# Patient Record
Sex: Female | Born: 1970
Health system: Southern US, Community
[De-identification: ages and names within clinical notes are randomized; demographics above are authoritative.]

## PROBLEM LIST (undated history)

## (undated) DIAGNOSIS — G459 Transient cerebral ischemic attack, unspecified: Secondary | ICD-10-CM

## (undated) DIAGNOSIS — E11319 Type 2 diabetes mellitus with unspecified diabetic retinopathy without macular edema: Secondary | ICD-10-CM

## (undated) DIAGNOSIS — H3320 Serous retinal detachment, unspecified eye: Secondary | ICD-10-CM

## (undated) DIAGNOSIS — K219 Gastro-esophageal reflux disease without esophagitis: Secondary | ICD-10-CM

## (undated) DIAGNOSIS — F319 Bipolar disorder, unspecified: Secondary | ICD-10-CM

## (undated) DIAGNOSIS — F329 Major depressive disorder, single episode, unspecified: Secondary | ICD-10-CM

## (undated) DIAGNOSIS — Z9289 Personal history of other medical treatment: Secondary | ICD-10-CM

## (undated) DIAGNOSIS — N183 Chronic kidney disease, stage 3 (moderate): Secondary | ICD-10-CM

## (undated) DIAGNOSIS — J449 Chronic obstructive pulmonary disease, unspecified: Secondary | ICD-10-CM

## (undated) DIAGNOSIS — G709 Myoneural disorder, unspecified: Secondary | ICD-10-CM

## (undated) DIAGNOSIS — F32A Depression, unspecified: Secondary | ICD-10-CM

## (undated) DIAGNOSIS — R252 Cramp and spasm: Secondary | ICD-10-CM

## (undated) DIAGNOSIS — N184 Chronic kidney disease, stage 4 (severe): Secondary | ICD-10-CM

## (undated) DIAGNOSIS — I10 Essential (primary) hypertension: Secondary | ICD-10-CM

## (undated) DIAGNOSIS — E119 Type 2 diabetes mellitus without complications: Secondary | ICD-10-CM

## (undated) DIAGNOSIS — G629 Polyneuropathy, unspecified: Secondary | ICD-10-CM

## (undated) DIAGNOSIS — I639 Cerebral infarction, unspecified: Secondary | ICD-10-CM

## (undated) DIAGNOSIS — R0600 Dyspnea, unspecified: Secondary | ICD-10-CM

## (undated) DIAGNOSIS — D649 Anemia, unspecified: Secondary | ICD-10-CM

## (undated) DIAGNOSIS — I469 Cardiac arrest, cause unspecified: Secondary | ICD-10-CM

## (undated) DIAGNOSIS — Z8673 Personal history of transient ischemic attack (TIA), and cerebral infarction without residual deficits: Secondary | ICD-10-CM

## (undated) DIAGNOSIS — M1990 Unspecified osteoarthritis, unspecified site: Secondary | ICD-10-CM

## (undated) DIAGNOSIS — F419 Anxiety disorder, unspecified: Secondary | ICD-10-CM

## (undated) DIAGNOSIS — R262 Difficulty in walking, not elsewhere classified: Secondary | ICD-10-CM

## (undated) DIAGNOSIS — I2510 Atherosclerotic heart disease of native coronary artery without angina pectoris: Secondary | ICD-10-CM

## (undated) DIAGNOSIS — I509 Heart failure, unspecified: Secondary | ICD-10-CM

## (undated) DIAGNOSIS — F4310 Post-traumatic stress disorder, unspecified: Secondary | ICD-10-CM

## (undated) DIAGNOSIS — R569 Unspecified convulsions: Secondary | ICD-10-CM

## (undated) HISTORY — PX: LASIK: SHX215

## (undated) HISTORY — DX: Personal history of transient ischemic attack (TIA), and cerebral infarction without residual deficits: Z86.73

## (undated) HISTORY — DX: Essential (primary) hypertension: I10

## (undated) HISTORY — DX: Personal history of other medical treatment: Z92.89

## (undated) HISTORY — PX: EYE SURGERY: SHX253

## (undated) HISTORY — DX: Chronic kidney disease, stage 4 (severe): N18.4

## (undated) HISTORY — DX: Chronic kidney disease, stage 3 (moderate): N18.3

## (undated) HISTORY — DX: Serous retinal detachment, unspecified eye: H33.20

## (undated) HISTORY — PX: DILATION AND CURETTAGE OF UTERUS: SHX78

## (undated) HISTORY — DX: Type 2 diabetes mellitus without complications: E11.9

## (undated) HISTORY — PX: TUBAL LIGATION: SHX77

---

## 2009-06-05 DIAGNOSIS — Z9289 Personal history of other medical treatment: Secondary | ICD-10-CM

## 2009-06-05 HISTORY — DX: Personal history of other medical treatment: Z92.89

## 2010-09-19 ENCOUNTER — Emergency Department (HOSPITAL_COMMUNITY)
Admission: EM | Admit: 2010-09-19 | Discharge: 2010-09-19 | Discharge status: Against Medical Advice | Payer: Medicaid Other | Attending: Emergency Medicine | Admitting: Emergency Medicine

## 2010-09-19 DIAGNOSIS — E119 Type 2 diabetes mellitus without complications: Secondary | ICD-10-CM | POA: Insufficient documentation

## 2010-09-19 DIAGNOSIS — I10 Essential (primary) hypertension: Secondary | ICD-10-CM | POA: Insufficient documentation

## 2010-09-19 LAB — GLUCOSE, CAPILLARY: Glucose-Capillary: 227 mg/dL — ABNORMAL HIGH (ref 70–99)

## 2010-09-19 LAB — CBC
HCT: 38.4 % (ref 36.0–46.0)
Hemoglobin: 12.9 g/dL (ref 12.0–15.0)
MCH: 25.7 pg — ABNORMAL LOW (ref 26.0–34.0)
MCHC: 33.6 g/dL (ref 30.0–36.0)
MCV: 76.6 fL — ABNORMAL LOW (ref 78.0–100.0)
Platelets: 233 K/uL (ref 150–400)
RBC: 5.01 MIL/uL (ref 3.87–5.11)
RDW: 12.8 % (ref 11.5–15.5)
WBC: 7.5 K/uL (ref 4.0–10.5)

## 2010-09-19 LAB — BASIC METABOLIC PANEL
BUN: 9 mg/dL (ref 6–23)
CO2: 26 mEq/L (ref 19–32)
Calcium: 8.9 mg/dL (ref 8.4–10.5)
Chloride: 104 mEq/L (ref 96–112)
Creatinine, Ser: 0.75 mg/dL (ref 0.4–1.2)
GFR calc Af Amer: >60 mL/min (ref >60)
GFR calc non Af Amer: >60 mL/min (ref >60)
Glucose, Bld: 218 mg/dL — ABNORMAL HIGH (ref 70–99)
Potassium: 3.4 mEq/L — ABNORMAL LOW (ref 3.5–5.1)
Sodium: 136 mEq/L (ref 135–145)

## 2010-09-19 LAB — DIFFERENTIAL
Basophils Absolute: 0.1 K/uL (ref 0.0–0.1)
Basophils Relative: 1 % (ref 0–1)
Eosinophils Absolute: 0.4 K/uL (ref 0.0–0.7)
Eosinophils Relative: 6 % — ABNORMAL HIGH (ref 0–5)
Lymphocytes Relative: 45 % (ref 12–46)
Lymphs Abs: 3.3 K/uL (ref 0.7–4.0)
Monocytes Absolute: 0.7 K/uL (ref 0.1–1.0)
Monocytes Relative: 9 % (ref 3–12)
Neutro Abs: 3 K/uL (ref 1.7–7.7)
Neutrophils Relative %: 40 % — ABNORMAL LOW (ref 43–77)

## 2010-10-25 ENCOUNTER — Observation Stay (HOSPITAL_COMMUNITY)
Admission: RE | Admit: 2010-10-25 | Discharge: 2010-10-26 | Disposition: A | Discharge status: Home / Self Care | Payer: Medicaid Other | Source: Ambulatory Visit | Attending: Ophthalmology | Admitting: Ophthalmology

## 2010-10-25 ENCOUNTER — Ambulatory Visit (HOSPITAL_COMMUNITY): Payer: Medicaid Other

## 2010-10-25 DIAGNOSIS — H4310 Vitreous hemorrhage, unspecified eye: Secondary | ICD-10-CM | POA: Insufficient documentation

## 2010-10-25 DIAGNOSIS — F17200 Nicotine dependence, unspecified, uncomplicated: Secondary | ICD-10-CM | POA: Insufficient documentation

## 2010-10-25 DIAGNOSIS — I10 Essential (primary) hypertension: Secondary | ICD-10-CM | POA: Insufficient documentation

## 2010-10-25 DIAGNOSIS — E669 Obesity, unspecified: Secondary | ICD-10-CM | POA: Insufficient documentation

## 2010-10-25 DIAGNOSIS — E11359 Type 2 diabetes mellitus with proliferative diabetic retinopathy without macular edema: Secondary | ICD-10-CM | POA: Insufficient documentation

## 2010-10-25 DIAGNOSIS — H3340 Traction detachment of retina, unspecified eye: Principal | ICD-10-CM | POA: Insufficient documentation

## 2010-10-25 DIAGNOSIS — J4489 Other specified chronic obstructive pulmonary disease: Secondary | ICD-10-CM | POA: Insufficient documentation

## 2010-10-25 DIAGNOSIS — E1139 Type 2 diabetes mellitus with other diabetic ophthalmic complication: Secondary | ICD-10-CM | POA: Insufficient documentation

## 2010-10-25 DIAGNOSIS — J449 Chronic obstructive pulmonary disease, unspecified: Secondary | ICD-10-CM | POA: Insufficient documentation

## 2010-10-25 LAB — GLUCOSE, CAPILLARY
Glucose-Capillary: 146 mg/dL — ABNORMAL HIGH (ref 70–99)
Glucose-Capillary: 207 mg/dL — ABNORMAL HIGH (ref 70–99)
Glucose-Capillary: 279 mg/dL — ABNORMAL HIGH (ref 70–99)
Glucose-Capillary: 308 mg/dL — ABNORMAL HIGH (ref 70–99)
Glucose-Capillary: 314 mg/dL — ABNORMAL HIGH (ref 70–99)
Glucose-Capillary: 366 mg/dL — ABNORMAL HIGH (ref 70–99)
Glucose-Capillary: 390 mg/dL — ABNORMAL HIGH (ref 70–99)

## 2010-10-25 LAB — CBC
HCT: 40.6 % (ref 36.0–46.0)
Hemoglobin: 13.4 g/dL (ref 12.0–15.0)
MCH: 25 pg — ABNORMAL LOW (ref 26.0–34.0)
MCHC: 33 g/dL (ref 30.0–36.0)
MCV: 75.7 fL — ABNORMAL LOW (ref 78.0–100.0)
Platelets: 213 10*3/uL (ref 150–400)
RBC: 5.36 MIL/uL — ABNORMAL HIGH (ref 3.87–5.11)
RDW: 12.4 % (ref 11.5–15.5)
WBC: 5.5 10*3/uL (ref 4.0–10.5)

## 2010-10-25 LAB — BASIC METABOLIC PANEL
BUN: 16 mg/dL (ref 6–23)
CO2: 26 mEq/L (ref 19–32)
Calcium: 9.6 mg/dL (ref 8.4–10.5)
Chloride: 98 mEq/L (ref 96–112)
Creatinine, Ser: 0.66 mg/dL (ref 0.4–1.2)
GFR calc Af Amer: >60 mL/min (ref >60)
GFR calc non Af Amer: >60 mL/min (ref >60)
Glucose, Bld: 312 mg/dL — ABNORMAL HIGH (ref 70–99)
Potassium: 4.2 mEq/L (ref 3.5–5.1)
Sodium: 133 mEq/L — ABNORMAL LOW (ref 135–145)

## 2010-10-25 LAB — HCG, SERUM, QUALITATIVE: Preg, Serum: NEGATIVE

## 2010-10-25 LAB — SURGICAL PCR SCREEN
MRSA, PCR: NEGATIVE
Staphylococcus aureus: NEGATIVE

## 2010-10-26 LAB — GLUCOSE, CAPILLARY
Glucose-Capillary: 162 mg/dL — ABNORMAL HIGH (ref 70–99)
Glucose-Capillary: 196 mg/dL — ABNORMAL HIGH (ref 70–99)

## 2010-10-27 LAB — GLUCOSE, CAPILLARY: Glucose-Capillary: 198 mg/dL — ABNORMAL HIGH (ref 70–99)

## 2010-10-28 NOTE — Op Note (Signed)
  NAMEELIANA, Samantha Pierce              ACCOUNT NO.:  [id number]  MEDICAL RECORD NO.:  [id number]           PATIENT TYPE:  O  LOCATION:  [id number]                         FACILITY:  [facility]  PHYSICIAN:  Chrystie Nose. Zigmund Daniel, M.D. DATE OF BIRTH:  [birth year]  DATE OF PROCEDURE:  10/25/2010 DATE OF DISCHARGE:                              OPERATIVE REPORT   ADMISSION DIAGNOSES:  Traction retinal detachment of the right eye, proliferative diabetic retinopathy in the right eye, and vitreous hemorrhage in the right eye.  SURGEON:  Chrystie Nose. Zigmund Daniel, MD  ASSISTANT:  Deatra Ina, MA  ANESTHESIA:  General.  DETAILS:  After usual prep and drape, 25-gauge trocar was placed at 8, 10, and 2 o'clock.  Provisc was placed on the corneal surface.  Pars plana vitrectomy was begun just behind the crystalline lens.  The vitrectomy was carried posteriorly where blood and fibrous traction was encountered.  There was total traction detachment of the macular region and the upper and lower arcades extending temporally and nasally.  A large fibrotic band was seen in this area.  The lighted pick and the cutter were used to engage this band and remove the surface retinal proliferation.  Vitrectomy was carried into the mid periphery where additional surface proliferation was removed.  Endo cautery was used for hemostasis.  The vitrectomy was carried out into the far peritomy with a wide-field Biome viewing lens.  The vitrectomy was carried down to the vitreous base for 360 degrees.  The endolaser was positioned in the eye, 1283 burns were placed around the retinal periphery.  The power was 1500 milliwatts, 1000 microns each at 0.1 seconds each.  A total gas fluid exchange was carried out to reattach the areas of traction detachment. The instruments were removed from the eye and the wounds were tested. They were found to be tight.  Polymyxin and gentamicin were irrigated into [place] space.  Atropine solution was applied.   Marcaine was injected around the globe for postop pain.  Decadron 10 mg was injected to the lower subconjunctival space.  Closing pressure was 10 with a Pharmacologist.  Polysporin patch and shield were placed.  The patient was taken to recovery in satisfactory condition.  COMPLICATIONS:  None.  DURATION:  One hour.     Chrystie Nose. Zigmund Daniel, M.D.    JDM/MEDQ  D:  10/25/2010  T:  10/26/2010  Job:  [id number]  Electronically Signed by Tempie Hoist M.D. on 10/28/2010 06:20:10 AM

## 2011-01-31 ENCOUNTER — Encounter (INDEPENDENT_AMBULATORY_CARE_PROVIDER_SITE_OTHER): Payer: Medicaid Other | Admitting: Ophthalmology

## 2011-01-31 DIAGNOSIS — H4310 Vitreous hemorrhage, unspecified eye: Secondary | ICD-10-CM

## 2011-01-31 DIAGNOSIS — E11359 Type 2 diabetes mellitus with proliferative diabetic retinopathy without macular edema: Secondary | ICD-10-CM

## 2011-01-31 DIAGNOSIS — H43819 Vitreous degeneration, unspecified eye: Secondary | ICD-10-CM

## 2011-04-06 ENCOUNTER — Encounter (INDEPENDENT_AMBULATORY_CARE_PROVIDER_SITE_OTHER): Payer: Medicaid Other | Admitting: Ophthalmology

## 2011-04-06 DIAGNOSIS — E11359 Type 2 diabetes mellitus with proliferative diabetic retinopathy without macular edema: Secondary | ICD-10-CM

## 2011-04-06 DIAGNOSIS — H4310 Vitreous hemorrhage, unspecified eye: Secondary | ICD-10-CM

## 2011-04-06 DIAGNOSIS — H43819 Vitreous degeneration, unspecified eye: Secondary | ICD-10-CM

## 2011-04-06 DIAGNOSIS — H2510 Age-related nuclear cataract, unspecified eye: Secondary | ICD-10-CM

## 2011-04-11 DIAGNOSIS — E113593 Type 2 diabetes mellitus with proliferative diabetic retinopathy without macular edema, bilateral: Secondary | ICD-10-CM

## 2011-04-11 DIAGNOSIS — H3340 Traction detachment of retina, unspecified eye: Secondary | ICD-10-CM

## 2011-04-11 NOTE — H&P (Signed)
Samantha Pierce is an 40 y.o. female.   Chief Complaint: Loss of vision OS  HPI: 40 yo type I diabetic with loss of vision.  No past medical history on file. DM x 25 yrs    No past surgical history on file. Pars Plana vitrectomy OD  July 2012  No family history on file. Social History:  does not have a smoking history on file. She does not have any smokeless tobacco history on file. Her alcohol and drug histories not on file. Smokes 3 cigaretts per day  Allergies: Allergies not on file  No current facility-administered medications on file as of .   No current outpatient prescriptions on file as of .    Review of systems otherwise negative  There were no vitals taken for this visit.  Physical exam: Mental status: oriented x3. Eyes: See scanned eye exam associated with this visit/encounter in media tab Ears, Nose, Throat: within normal limits Neck: Within Normal limits General: within normal limits Chest: Within normal limits Breast: deferred Heart: Within normal limits Abdomen: Within normal limits GU: deferred Extremities: within normal limits Skin: within normal limits  Assessment/Plan Diabetic with tractional retinal detachment and vitreous hemorrhage Plan: To [facility] for pars plana vitrectomy, membrane peel, laser and gas  Hayden Pedro 04/11/2011, 3:33 PM

## 2011-04-17 ENCOUNTER — Encounter (HOSPITAL_COMMUNITY): Payer: Self-pay | Admitting: Pharmacy Technician

## 2011-04-18 ENCOUNTER — Encounter (HOSPITAL_COMMUNITY): Payer: Self-pay | Admitting: *Deleted

## 2011-04-19 MED ORDER — TROPICAMIDE 1 % OP SOLN
1.0000 [drp] | OPHTHALMIC | Status: AC
  Administered 2011-04-20 (×3): 1 [drp] via OPHTHALMIC
  Filled 2011-04-19: qty 3

## 2011-04-19 MED ORDER — GATIFLOXACIN 0.5 % OP SOLN
1.0000 [drp] | OPHTHALMIC | Status: AC
  Administered 2011-04-20 (×3): 1 [drp] via OPHTHALMIC
  Filled 2011-04-19: qty 2.5

## 2011-04-19 MED ORDER — PHENYLEPHRINE HCL 2.5 % OP SOLN
1.0000 [drp] | OPHTHALMIC | Status: AC
  Administered 2011-04-20 (×3): 1 [drp] via OPHTHALMIC
  Filled 2011-04-19: qty 3

## 2011-04-19 MED ORDER — CYCLOPENTOLATE HCL 1 % OP SOLN
1.0000 [drp] | OPHTHALMIC | Status: AC
Start: 2011-04-20 — End: 2011-04-20
  Administered 2011-04-20 (×3): 1 [drp] via OPHTHALMIC
  Filled 2011-04-19 (×2): qty 2

## 2011-04-20 ENCOUNTER — Encounter (HOSPITAL_COMMUNITY)
Admission: RE | Disposition: A | Discharge status: Home / Self Care | Payer: Self-pay | Source: Ambulatory Visit | Attending: Ophthalmology

## 2011-04-20 ENCOUNTER — Ambulatory Visit (HOSPITAL_COMMUNITY)
Admission: RE | Admit: 2011-04-20 | Discharge: 2011-04-21 | Disposition: A | Discharge status: Home / Self Care | Payer: Medicaid Other | Source: Ambulatory Visit | Attending: Ophthalmology | Admitting: Ophthalmology

## 2011-04-20 ENCOUNTER — Ambulatory Visit (HOSPITAL_COMMUNITY): Payer: Medicaid Other | Admitting: Certified Registered"

## 2011-04-20 ENCOUNTER — Encounter (HOSPITAL_COMMUNITY): Payer: Self-pay | Admitting: Certified Registered"

## 2011-04-20 ENCOUNTER — Encounter (HOSPITAL_COMMUNITY): Payer: Self-pay | Admitting: *Deleted

## 2011-04-20 DIAGNOSIS — H3340 Traction detachment of retina, unspecified eye: Secondary | ICD-10-CM

## 2011-04-20 DIAGNOSIS — E1139 Type 2 diabetes mellitus with other diabetic ophthalmic complication: Secondary | ICD-10-CM | POA: Insufficient documentation

## 2011-04-20 DIAGNOSIS — E113593 Type 2 diabetes mellitus with proliferative diabetic retinopathy without macular edema, bilateral: Secondary | ICD-10-CM

## 2011-04-20 DIAGNOSIS — E11359 Type 2 diabetes mellitus with proliferative diabetic retinopathy without macular edema: Secondary | ICD-10-CM

## 2011-04-20 DIAGNOSIS — H4310 Vitreous hemorrhage, unspecified eye: Secondary | ICD-10-CM

## 2011-04-20 DIAGNOSIS — I10 Essential (primary) hypertension: Secondary | ICD-10-CM | POA: Insufficient documentation

## 2011-04-20 DIAGNOSIS — F17200 Nicotine dependence, unspecified, uncomplicated: Secondary | ICD-10-CM | POA: Insufficient documentation

## 2011-04-20 HISTORY — DX: Major depressive disorder, single episode, unspecified: F32.9

## 2011-04-20 HISTORY — DX: Anxiety disorder, unspecified: F41.9

## 2011-04-20 HISTORY — DX: Depression, unspecified: F32.A

## 2011-04-20 HISTORY — DX: Anemia, unspecified: D64.9

## 2011-04-20 HISTORY — PX: PARS PLANA VITRECTOMY: SHX2166

## 2011-04-20 LAB — SURGICAL PCR SCREEN
MRSA, PCR: NEGATIVE
Staphylococcus aureus: NEGATIVE

## 2011-04-20 LAB — CBC
HCT: 41.2 % (ref 36.0–46.0)
Hemoglobin: 13.4 g/dL (ref 12.0–15.0)
MCH: 25.4 pg — ABNORMAL LOW (ref 26.0–34.0)
MCHC: 32.5 g/dL (ref 30.0–36.0)
MCV: 78 fL (ref 78.0–100.0)
Platelets: 225 10*3/uL (ref 150–400)
RBC: 5.28 MIL/uL — ABNORMAL HIGH (ref 3.87–5.11)
RDW: 12.7 % (ref 11.5–15.5)
WBC: 7.2 10*3/uL (ref 4.0–10.5)

## 2011-04-20 LAB — BASIC METABOLIC PANEL
BUN: 17 mg/dL (ref 6–23)
CO2: 29 mEq/L (ref 19–32)
Calcium: 9.6 mg/dL (ref 8.4–10.5)
Chloride: 103 mEq/L (ref 96–112)
Creatinine, Ser: 0.85 mg/dL (ref 0.50–1.10)
GFR calc Af Amer: >90 mL/min (ref >90)
GFR calc non Af Amer: 85 mL/min — ABNORMAL LOW (ref >90)
Glucose, Bld: 139 mg/dL — ABNORMAL HIGH (ref 70–99)
Potassium: 4.5 mEq/L (ref 3.5–5.1)
Sodium: 141 mEq/L (ref 135–145)

## 2011-04-20 LAB — GLUCOSE, CAPILLARY
Glucose-Capillary: 164 mg/dL — ABNORMAL HIGH (ref 70–99)
Glucose-Capillary: 108 mg/dL — ABNORMAL HIGH (ref 70–99)
Glucose-Capillary: 294 mg/dL — ABNORMAL HIGH (ref 70–99)

## 2011-04-20 LAB — HCG, SERUM, QUALITATIVE: Preg, Serum: NEGATIVE

## 2011-04-20 SURGERY — PARS PLANA VITRECTOMY WITH 25 GAUGE
Anesthesia: General | Site: Eye | Laterality: Left | Wound class: Clean

## 2011-04-20 MED ORDER — ONDANSETRON HCL 4 MG/2ML IJ SOLN
INTRAMUSCULAR | Status: DC | PRN
  Administered 2011-04-20: 4 mg via INTRAVENOUS

## 2011-04-20 MED ORDER — ACETAMINOPHEN 325 MG PO TABS: 325.0000 mg | ORAL_TABLET | ORAL | Status: DC | PRN

## 2011-04-20 MED ORDER — GATIFLOXACIN 0.5 % OP SOLN
1.0000 [drp] | Freq: Four times a day (QID) | OPHTHALMIC | Status: DC
  Administered 2011-04-21: 1 [drp] via OPHTHALMIC
  Filled 2011-04-20: qty 2.5

## 2011-04-20 MED ORDER — BSS IO SOLN
INTRAOCULAR | Status: DC | PRN
  Administered 2011-04-20: 15 mL via INTRAOCULAR

## 2011-04-20 MED ORDER — LACTATED RINGERS IV SOLN
INTRAVENOUS | Status: DC | PRN
  Administered 2011-04-20: 12:00:00 via INTRAVENOUS

## 2011-04-20 MED ORDER — PHENYLEPHRINE HCL 10 MG/ML IJ SOLN
INTRAMUSCULAR | Status: DC | PRN
  Administered 2011-04-20: 80 ug via INTRAVENOUS
  Administered 2011-04-20: 120 ug via INTRAVENOUS
  Administered 2011-04-20: 80 ug via INTRAVENOUS

## 2011-04-20 MED ORDER — SODIUM CHLORIDE 0.9 % IV SOLN
INTRAVENOUS | Status: DC | PRN
  Administered 2011-04-20: 12:00:00 via INTRAVENOUS

## 2011-04-20 MED ORDER — MEPERIDINE HCL 25 MG/ML IJ SOLN: 6.2500 mg | INTRAMUSCULAR | Status: DC | PRN

## 2011-04-20 MED ORDER — SIMVASTATIN 10 MG PO TABS
10.0000 mg | ORAL_TABLET | Freq: Every day | ORAL | Status: DC
  Administered 2011-04-20: 10 mg via ORAL
  Filled 2011-04-20 (×2): qty 1

## 2011-04-20 MED ORDER — BISACODYL 5 MG PO TBEC: 5.0000 mg | DELAYED_RELEASE_TABLET | Freq: Every day | ORAL | Status: DC | PRN

## 2011-04-20 MED ORDER — AMLODIPINE BESY-BENAZEPRIL HCL 10-40 MG PO CAPS: 1.0000 | ORAL_CAPSULE | Freq: Every day | ORAL | Status: DC

## 2011-04-20 MED ORDER — HEMOSTATIC AGENTS (NO CHARGE) OPTIME
TOPICAL | Status: DC | PRN
  Administered 2011-04-20: 1

## 2011-04-20 MED ORDER — SITAGLIPTIN PHOS-METFORMIN HCL 50-1000 MG PO TABS: 1.0000 | ORAL_TABLET | Freq: Two times a day (BID) | ORAL | Status: DC

## 2011-04-20 MED ORDER — MORPHINE SULFATE 2 MG/ML IJ SOLN: 0.0500 mg/kg | INTRAMUSCULAR | Status: DC | PRN

## 2011-04-20 MED ORDER — AMLODIPINE BESYLATE 10 MG PO TABS
10.0000 mg | ORAL_TABLET | Freq: Every day | ORAL | Status: DC
  Administered 2011-04-20: 10 mg via ORAL
  Filled 2011-04-20 (×2): qty 1

## 2011-04-20 MED ORDER — SODIUM CHLORIDE 0.9 % IJ SOLN
INTRAMUSCULAR | Status: DC | PRN
  Administered 2011-04-20: 10 mL

## 2011-04-20 MED ORDER — OMEGA-3 FATTY ACIDS 1000 MG PO CAPS: 2.0000 g | ORAL_CAPSULE | Freq: Every day | ORAL | Status: DC

## 2011-04-20 MED ORDER — ONDANSETRON HCL 4 MG/2ML IJ SOLN
4.0000 mg | Freq: Once | INTRAMUSCULAR | Status: DC | PRN
Start: 2011-04-20 — End: 2011-04-20

## 2011-04-20 MED ORDER — BSS PLUS IO SOLN
INTRAOCULAR | Status: DC | PRN
  Administered 2011-04-20: 1 via OPHTHALMIC

## 2011-04-20 MED ORDER — ATROPINE SULFATE 1 % OP SOLN
OPHTHALMIC | Status: DC | PRN
  Administered 2011-04-20: 1 [drp] via OPHTHALMIC

## 2011-04-20 MED ORDER — MUPIROCIN 2 % EX OINT
TOPICAL_OINTMENT | Freq: Two times a day (BID) | CUTANEOUS | Status: DC
  Administered 2011-04-20: 10:00:00 via NASAL
  Filled 2011-04-20: qty 22

## 2011-04-20 MED ORDER — PREDNISOLONE ACETATE 1 % OP SUSP
1.0000 [drp] | Freq: Four times a day (QID) | OPHTHALMIC | Status: DC
  Administered 2011-04-21: 1 [drp] via OPHTHALMIC
  Filled 2011-04-20: qty 5
  Filled 2011-04-20: qty 1

## 2011-04-20 MED ORDER — BACITRACIN-POLYMYXIN B 500-10000 UNIT/GM OP OINT
TOPICAL_OINTMENT | OPHTHALMIC | Status: DC | PRN
  Administered 2011-04-20: 1 via OPHTHALMIC

## 2011-04-20 MED ORDER — HYDROMORPHONE HCL PF 1 MG/ML IJ SOLN: 0.2500 mg | INTRAMUSCULAR | Status: DC | PRN

## 2011-04-20 MED ORDER — INSULIN GLARGINE 100 UNIT/ML ~~LOC~~ SOLN
70.0000 [IU] | Freq: Every day | SUBCUTANEOUS | Status: DC
  Administered 2011-04-20: 70 [IU] via SUBCUTANEOUS
  Filled 2011-04-20: qty 3

## 2011-04-20 MED ORDER — SODIUM CHLORIDE 0.45 % IV SOLN
INTRAVENOUS | Status: DC
  Administered 2011-04-20: 16:00:00 via INTRAVENOUS

## 2011-04-20 MED ORDER — GABAPENTIN 300 MG PO CAPS
300.0000 mg | ORAL_CAPSULE | Freq: Every day | ORAL | Status: DC
  Administered 2011-04-20: 300 mg via ORAL
  Filled 2011-04-20 (×2): qty 1

## 2011-04-20 MED ORDER — ACETAZOLAMIDE SODIUM 500 MG IJ SOLR
500.0000 mg | Freq: Once | INTRAMUSCULAR | Status: AC
  Administered 2011-04-21: 500 mg via INTRAVENOUS
  Filled 2011-04-20: qty 500

## 2011-04-20 MED ORDER — CLINDAMYCIN PHOSPHATE 600 MG/50ML IV SOLN
600.0000 mg | INTRAVENOUS | Status: AC
Start: 2011-04-20 — End: 2011-04-20
  Administered 2011-04-20: 600 mg via INTRAVENOUS
  Filled 2011-04-20: qty 50

## 2011-04-20 MED ORDER — TETRACAINE HCL 0.5 % OP SOLN
1.0000 [drp] | Freq: Once | OPHTHALMIC | Status: AC
  Administered 2011-04-21: 1 [drp] via OPHTHALMIC
  Filled 2011-04-20 (×2): qty 2

## 2011-04-20 MED ORDER — DEXAMETHASONE SODIUM PHOSPHATE 10 MG/ML IJ SOLN
INTRAMUSCULAR | Status: DC | PRN
  Administered 2011-04-20: 1 mL

## 2011-04-20 MED ORDER — BUPIVACAINE HCL 0.75 % IJ SOLN
INTRAMUSCULAR | Status: DC | PRN
  Administered 2011-04-20: 10 mL

## 2011-04-20 MED ORDER — LINAGLIPTIN 5 MG PO TABS
5.0000 mg | ORAL_TABLET | Freq: Every day | ORAL | Status: DC
  Administered 2011-04-20: 5 mg via ORAL
  Filled 2011-04-20 (×2): qty 1

## 2011-04-20 MED ORDER — NEOSTIGMINE METHYLSULFATE 1 MG/ML IJ SOLN
INTRAMUSCULAR | Status: DC | PRN
  Administered 2011-04-20: 5 mg via INTRAVENOUS

## 2011-04-20 MED ORDER — PHENYLEPHRINE HCL 10 MG/ML IJ SOLN
10.0000 mg | INTRAVENOUS | Status: DC | PRN
  Administered 2011-04-20: 10 ug/min via INTRAVENOUS

## 2011-04-20 MED ORDER — HYDROCODONE-ACETAMINOPHEN 5-325 MG PO TABS
1.0000 | ORAL_TABLET | ORAL | Status: DC | PRN
  Administered 2011-04-20: 2 via ORAL
  Filled 2011-04-20: qty 2

## 2011-04-20 MED ORDER — BRIMONIDINE TARTRATE 0.2 % OP SOLN
1.0000 [drp] | Freq: Two times a day (BID) | OPHTHALMIC | Status: DC
  Filled 2011-04-20: qty 5

## 2011-04-20 MED ORDER — GENTAMICIN SULFATE 40 MG/ML IJ SOLN
INTRAMUSCULAR | Status: DC | PRN
  Administered 2011-04-20: 2 mL

## 2011-04-20 MED ORDER — ROCURONIUM BROMIDE 100 MG/10ML IV SOLN
INTRAVENOUS | Status: DC | PRN
  Administered 2011-04-20: 40 mg via INTRAVENOUS

## 2011-04-20 MED ORDER — SODIUM CHLORIDE 0.9 % IR SOLN
Status: DC | PRN
  Administered 2011-04-20: 1000 mL

## 2011-04-20 MED ORDER — PROPOFOL 10 MG/ML IV EMUL
INTRAVENOUS | Status: DC | PRN
  Administered 2011-04-20: 200 mg via INTRAVENOUS

## 2011-04-20 MED ORDER — MUPIROCIN 2 % EX OINT
TOPICAL_OINTMENT | CUTANEOUS | Status: AC
  Filled 2011-04-20: qty 22

## 2011-04-20 MED ORDER — ONDANSETRON HCL 4 MG/2ML IJ SOLN: 4.0000 mg | Freq: Four times a day (QID) | INTRAMUSCULAR | Status: DC | PRN

## 2011-04-20 MED ORDER — BACITRACIN-POLYMYXIN B 500-10000 UNIT/GM OP OINT
1.0000 "application " | TOPICAL_OINTMENT | Freq: Four times a day (QID) | OPHTHALMIC | Status: DC
  Administered 2011-04-21: 1 via OPHTHALMIC
  Filled 2011-04-20: qty 3.5

## 2011-04-20 MED ORDER — INSULIN ASPART 100 UNIT/ML ~~LOC~~ SOLN
25.0000 [IU] | Freq: Three times a day (TID) | SUBCUTANEOUS | Status: DC
  Administered 2011-04-20 – 2011-04-21 (×2): 25 [IU] via SUBCUTANEOUS
  Filled 2011-04-20 (×2): qty 3

## 2011-04-20 MED ORDER — MORPHINE SULFATE 2 MG/ML IJ SOLN
1.0000 mg | INTRAMUSCULAR | Status: AC | PRN
  Administered 2011-04-20 – 2011-04-21 (×2): 2 mg via INTRAVENOUS
  Filled 2011-04-20: qty 1

## 2011-04-20 MED ORDER — CLINDAMYCIN PHOSPHATE 600 MG/50ML IV SOLN
INTRAVENOUS | Status: DC | PRN
  Administered 2011-04-20: 600 mg via INTRAVENOUS

## 2011-04-20 MED ORDER — LATANOPROST 0.005 % OP SOLN
1.0000 [drp] | Freq: Every day | OPHTHALMIC | Status: DC
  Filled 2011-04-20: qty 2.5

## 2011-04-20 MED ORDER — EPINEPHRINE HCL 1 MG/ML IJ SOLN
INTRAMUSCULAR | Status: DC | PRN
  Administered 2011-04-20: .3 mL

## 2011-04-20 MED ORDER — POLYMYXIN B SULFATE 500000 UNITS IJ SOLR
INTRAMUSCULAR | Status: DC | PRN
  Administered 2011-04-20: 500000 [IU]

## 2011-04-20 MED ORDER — ATENOLOL 50 MG PO TABS
50.0000 mg | ORAL_TABLET | Freq: Every day | ORAL | Status: DC
  Administered 2011-04-20: 50 mg via ORAL
  Filled 2011-04-20 (×2): qty 1

## 2011-04-20 MED ORDER — MIDAZOLAM HCL 5 MG/5ML IJ SOLN
INTRAMUSCULAR | Status: DC | PRN
  Administered 2011-04-20: 2 mg via INTRAVENOUS

## 2011-04-20 MED ORDER — OMEGA-3-ACID ETHYL ESTERS 1 G PO CAPS
2.0000 g | ORAL_CAPSULE | Freq: Every day | ORAL | Status: DC
  Filled 2011-04-20: qty 2

## 2011-04-20 MED ORDER — GLYCOPYRROLATE 0.2 MG/ML IJ SOLN
INTRAMUSCULAR | Status: DC | PRN
  Administered 2011-04-20: .2 mg via INTRAVENOUS
  Administered 2011-04-20: .8 mg via INTRAVENOUS

## 2011-04-20 MED ORDER — INFLUENZA VIRUS VACC SPLIT PF IM SUSP
0.5000 mL | INTRAMUSCULAR | Status: DC | PRN
  Filled 2011-04-20: qty 0.5

## 2011-04-20 MED ORDER — SUFENTANIL CITRATE 50 MCG/ML IV SOLN
INTRAVENOUS | Status: DC | PRN
  Administered 2011-04-20: 5 ug via INTRAVENOUS
  Administered 2011-04-20: 15 ug via INTRAVENOUS

## 2011-04-20 MED ORDER — BENAZEPRIL HCL 40 MG PO TABS
40.0000 mg | ORAL_TABLET | Freq: Every day | ORAL | Status: DC
  Administered 2011-04-20: 40 mg via ORAL
  Filled 2011-04-20 (×2): qty 1

## 2011-04-20 MED ORDER — METFORMIN HCL 500 MG PO TABS
1000.0000 mg | ORAL_TABLET | Freq: Two times a day (BID) | ORAL | Status: DC
  Administered 2011-04-20 – 2011-04-21 (×2): 1000 mg via ORAL
  Filled 2011-04-20 (×4): qty 2

## 2011-04-20 MED ORDER — PROVISC 10 MG/ML IO SOLN
INTRAOCULAR | Status: DC | PRN
  Administered 2011-04-20: .85 mL via INTRAOCULAR

## 2011-04-20 SURGICAL SUPPLY — 62 items
APPLICATOR DR MATTHEWS STRL (MISCELLANEOUS) IMPLANT
BLADE EYE CATARACT 19 1.4 BEAV (BLADE) IMPLANT
BLADE MVR KNIFE 19G (BLADE) IMPLANT
BLADE MVR KNIFE 20G (BLADE) IMPLANT
CANNULA DUAL BORE 23G (CANNULA) IMPLANT
CANNULA FLEX TIP 25G (CANNULA) IMPLANT
CANNULA IRRIGATING 27GX7/8 (BLADE) IMPLANT
CLOTH BEACON ORANGE TIMEOUT ST (SAFETY) ×2 IMPLANT
CORDS BIPOLAR (ELECTRODE) IMPLANT
COTTONBALL LRG STERILE PKG (GAUZE/BANDAGES/DRESSINGS) ×6 IMPLANT
DRAPE OPHTHALMIC 77X100 STRL (CUSTOM PROCEDURE TRAY) ×2 IMPLANT
EYE SHIELD UNIVERSAL CLEAR (GAUZE/BANDAGES/DRESSINGS) ×2 IMPLANT
FILTER BLUE MILLIPORE (MISCELLANEOUS) IMPLANT
FILTER STRAW FLUID ASPIR (MISCELLANEOUS) IMPLANT
FORCEPS ECKARDT ILM 25G SERR (OPHTHALMIC RELATED) IMPLANT
GLOVE BIO SURGEON STRL SZ7 (GLOVE) ×2 IMPLANT
GLOVE OPTIFIT SS 6.5 STRL BRWN (GLOVE) ×4 IMPLANT
GLOVE SS BIOGEL STRL SZ 7 (GLOVE) ×1 IMPLANT
GLOVE SUPERSENSE BIOGEL SZ 7 (GLOVE) ×1
GLOVE SURG 8.5 LATEX PF (GLOVE) ×4 IMPLANT
GLOVE SURG SS PI 6.5 STRL IVOR (GLOVE) ×2 IMPLANT
GOWN STRL NON-REIN LRG LVL3 (GOWN DISPOSABLE) ×6 IMPLANT
ILLUMINATOR CHOW PICK 25GA (MISCELLANEOUS) ×2 IMPLANT
KIT ROOM TURNOVER OR (KITS) ×2 IMPLANT
KNIFE CRESCENT 2.5 55 ANG (BLADE) IMPLANT
LENS BIOM SUPER VIEW SET DISP (OPHTHALMIC RELATED) ×2 IMPLANT
MARKER SKIN DUAL TIP RULER LAB (MISCELLANEOUS) ×2 IMPLANT
MICROPICK 25G (MISCELLANEOUS)
NEEDLE 18GX1X1/2 (RX/OR ONLY) (NEEDLE) ×2 IMPLANT
NEEDLE 25GX 5/8IN NON SAFETY (NEEDLE) ×2 IMPLANT
NEEDLE 27GAX1X1/2 (NEEDLE) IMPLANT
NEEDLE HYPO 30X.5 LL (NEEDLE) ×2 IMPLANT
NS IRRIG 1000ML POUR BTL (IV SOLUTION) ×2 IMPLANT
PACK VITRECTOMY CUSTOM (CUSTOM PROCEDURE TRAY) ×2 IMPLANT
PAD ARMBOARD 7.5X6 YLW CONV (MISCELLANEOUS) ×2 IMPLANT
PAD EYE OVAL STERILE LF (GAUZE/BANDAGES/DRESSINGS) ×2 IMPLANT
PAK VITRECTOMY PIK 25 GA (OPHTHALMIC RELATED) ×2 IMPLANT
PENCIL BIPOLAR 25GA STR DISP (OPHTHALMIC RELATED) IMPLANT
PICK MICROPICK 25G (MISCELLANEOUS) IMPLANT
PROBE DIRECTIONAL LASER (MISCELLANEOUS) ×2 IMPLANT
REPL STRA BRUSH NEEDLE (NEEDLE) IMPLANT
RESERVOIR BACK FLUSH (MISCELLANEOUS) IMPLANT
ROLLS DENTAL (MISCELLANEOUS) ×4 IMPLANT
SCRAPER DIAMOND DUST MEMBRANE (MISCELLANEOUS) IMPLANT
SPONGE SURGIFOAM ABS GEL 12-7 (HEMOSTASIS) ×2 IMPLANT
STOPCOCK 4 WAY LG BORE MALE ST (IV SETS) IMPLANT
SUT CHROMIC 7 0 TG140 8 (SUTURE) IMPLANT
SUT ETHILON 10 0 CS140 6 (SUTURE) IMPLANT
SUT ETHILON 9 0 TG140 8 (SUTURE) IMPLANT
SUT POLY NON ABSORB 10-0 8 STR (SUTURE) IMPLANT
SUT SILK 4 0 RB 1 (SUTURE) IMPLANT
SYR 20CC LL (SYRINGE) ×2 IMPLANT
SYR 50ML LL SCALE MARK (SYRINGE) IMPLANT
SYR 5ML LL (SYRINGE) IMPLANT
SYR BULB 3OZ (MISCELLANEOUS) ×2 IMPLANT
SYR TB 1ML LUER SLIP (SYRINGE) ×2 IMPLANT
SYRINGE 10CC LL (SYRINGE) IMPLANT
TAPE SURG TRANSPORE 1 IN (GAUZE/BANDAGES/DRESSINGS) ×1 IMPLANT
TAPE SURGICAL TRANSPORE 1 IN (GAUZE/BANDAGES/DRESSINGS) ×1
TOWEL OR 17X24 6PK STRL BLUE (TOWEL DISPOSABLE) ×6 IMPLANT
TROCAR CANNULA 25GA (CANNULA) IMPLANT
WATER STERILE IRR 1000ML POUR (IV SOLUTION) ×2 IMPLANT

## 2011-04-20 NOTE — Progress Notes (Signed)
Pt received from PACU. Pt assessment complete and oriented to room. Pt laying on right side.

## 2011-04-20 NOTE — Transfer of Care (Signed)
Immediate Anesthesia Transfer of Care Note  Patient: Samantha Pierce  Procedure(s) Performed:  PARS PLANA VITRECTOMY WITH 25 GAUGE - membrane peel, gas fluid exchange, endolaser, repair of complex retinal detachment  Patient Location: PACU  Anesthesia Type: General  Level of Consciousness: awake, alert  and oriented  Airway & Oxygen Therapy: Patient Spontanous Breathing and Patient connected to face mask oxygen  Post-op Assessment: Report given to PACU RN  Post vital signs: Reviewed and stable  Complications: No apparent anesthesia complications

## 2011-04-20 NOTE — Preoperative (Signed)
Beta Blockers   Reason not to administer Beta Blockers:Not Applicable 

## 2011-04-20 NOTE — Op Note (Signed)
NAMESUNIYA, Samantha Pierce              ACCOUNT NO.:  [id number]  MEDICAL RECORD NO.:  [id number]  LOCATION:  [id number]                         FACILITY:  [facility]  PHYSICIAN:  Chrystie Nose. Zigmund Daniel, M.D. DATE OF BIRTH:  06/12/1970  DATE OF PROCEDURE:  04/20/2011 DATE OF DISCHARGE:                              OPERATIVE REPORT   ADMISSION DIAGNOSES:  Traction retinal detachment, left eye. Proliferative diabetic retinopathy, left eye.  Vitreous hemorrhage, left eye.  SURGEON:  Chrystie Nose. Zigmund Daniel, MD  ASSISTANT:  Deatra Ina, SA  ANESTHESIA:  General.  DETAILS:  Usual prep and drape, 25-gauge trocars placed at 10, 2, and 4 o'clock, infusion at 4 o'clock.  The pars plana vitrectomy began just behind the cataractous lens.  Dense vitreous hemorrhage and vitreous were encountered.  These were tacked layer by layer until the macular came into view.  A ring traction retinal detachment was seen encompassing the disk upper arcade temporal macula and lower arcade. The much attention was paid to this ring and it was removed carefully segment by segment with cutter and pick under high magnification.  The contact lens ring was anchored into place at 6 and 12 o'clock and the flat contact lens was used for viewing.  Once the traction was released, the 30-degree prismatic lens was used and surface proliferation was removed from the mid periphery until all surface proliferation was removed.  The wide field biome viewing system was then moved into place and the ring was removed.  The vitrectomy was carried down to the vitreous base for 360 degrees.  Scleral depression was used to remove blood from the far periphery.  The endolaser was positioned in the eye, 1844 burns were placed around the retinal periphery.  The power was between 1015 100 mW each, size 1000 microns each and 0.1 seconds each. A total gas fluid exchange was carried out and suction of posterior hemorrhage on the retinal surface was performed until  an entire gas fill was performed.  The instruments were removed from the eye.  The wounds were tested and found to be tight.  Polymyxin and gentamicin were irrigated into tenon space.  Atropine solution was applied.  Closing pressure was 10 with a Barraquer tonometer.  Complications none. Duration 1-1/2 hours.  Polysporin ophthalmic ointment, a patch and shield were placed.  The patient was awakened, taken to recovery in satisfactory condition.     Chrystie Nose. Zigmund Daniel, M.D.     JDM/MEDQ  D:  04/20/2011  T:  04/20/2011  Job:  [id number]

## 2011-04-20 NOTE — Brief Op Note (Signed)
Brief Operative note   Preoperative diagnosis:  Traction Retinal Detachment, proliferative diabetic retinopathy Postoperative diagnosis   Traction Retinal Detachment, proliferative diabetic retinopathy  Procedures: Procedure(s) (LRB): PARS PLANA VITRECTOMY WITH 25 GAUGE (Left)   Surgeon:  Hayden Pedro, MD  Assistant:  Deatra Ina SA    Anesthesia: General  Specimen: none  Estimated blood loss:  1cc  Complications: none  Patient sent to PACU in good condition  Composed by Hayden Pedro MD  Dictation number: [id number]

## 2011-04-20 NOTE — Progress Notes (Signed)
Clindamycin was not given....but is sent to holding with this patient.

## 2011-04-20 NOTE — Progress Notes (Signed)
Pharmacy will change the eye drop order ... To state one drop left eye q15 min x3 pre op as per Dr Zigmund Daniel order.

## 2011-04-20 NOTE — Progress Notes (Signed)
The patient states she was told by her doctor Dr Zigmund Daniel to take 1/2 her normal dose of novalog this am ... She took 12 units this am. At home.

## 2011-04-20 NOTE — Interval H&P Note (Signed)
History and Physical Interval Note:   04/20/2011   7:55 AM   Samantha Pierce  has presented today for surgery, with the diagnosis of Samantha Pierce  The various methods of treatment have been discussed with the patient and family. After consideration of risks, benefits and other options for treatment, the patient has consented to  Procedure(s): PARS PLANA VITRECTOMY WITH 25 GAUGE as a surgical intervention .  The patients' history has been reviewed, patient examined, no change in status, stable for surgery.  I have reviewed the patients' chart and labs.  Questions were answered to the patient's satisfaction.     Samantha Pierce D  MD  I have examined the patient and there is no significant change in her history and physical.

## 2011-04-20 NOTE — Anesthesia Procedure Notes (Signed)
Procedure Name: Intubation Date/Time: 04/20/2011 12:13 PM Performed by: Jon Gills Pre-anesthesia Checklist: Patient identified, Emergency Drugs available, Suction available and Patient being monitored Patient Re-evaluated:Patient Re-evaluated prior to inductionOxygen Delivery Method: Circle System Utilized Preoxygenation: Pre-oxygenation with 100% oxygen Intubation Type: IV induction Ventilation: Mask ventilation without difficulty and Oral airway inserted - appropriate to patient size Laryngoscope Size: Sabra Heck and 2 Grade View: Grade I Tube type: Oral Tube size: 7.5 mm Number of attempts: 1 Airway Equipment and Method: stylet Placement Confirmation: ETT inserted through vocal cords under direct vision,  positive ETCO2 and breath sounds checked- equal and bilateral Secured at: 21 cm Tube secured with: Tape Dental Injury: Teeth and Oropharynx as per pre-operative assessment

## 2011-04-20 NOTE — Anesthesia Postprocedure Evaluation (Signed)
  Anesthesia Post-op Note  Patient: Samantha Pierce  Procedure(s) Performed:  PARS PLANA VITRECTOMY WITH 25 GAUGE - membrane peel, gas fluid exchange, endolaser, repair of complex retinal detachment  Patient Location: PACU  Anesthesia Type: General  Level of Consciousness: sedated  Airway and Oxygen Therapy: Patient Spontanous Breathing  Post-op Pain: none  Post-op Assessment: Post-op Vital signs reviewed, Patient's Cardiovascular Status Stable, Respiratory Function Stable, Patent Airway, No signs of Nausea or vomiting and Pain level controlled  Post-op Vital Signs: Reviewed and stable  Complications: No apparent anesthesia complications

## 2011-04-20 NOTE — Anesthesia Preprocedure Evaluation (Addendum)
Anesthesia Evaluation  Patient identified by MRN, date of birth, ID band Patient awake and Patient confused    Reviewed: Allergy & Precautions, H&P , NPO status , Patient's Chart, lab work & pertinent test results, reviewed documented beta blocker date and time   Airway Mallampati: II TM Distance: >3 FB Neck ROM: Full    Dental  (+) Teeth Intact and Dental Advisory Given   Pulmonary Current Smoker,  clear to auscultation        Cardiovascular hypertension, Pt. on medications Regular     Neuro/Psych    GI/Hepatic   Endo/Other  Diabetes mellitus-, Well Controlled, Type 1, Insulin Dependent and Oral Hypoglycemic AgentsMorbid obesity  Renal/GU Renal diseasecre 0.85     Musculoskeletal   Abdominal   Peds  Hematology   Anesthesia Other Findings   Reproductive/Obstetrics                          Anesthesia Physical Anesthesia Plan  ASA: III  Anesthesia Plan: General   Post-op Pain Management:    Induction: Intravenous  Airway Management Planned: Oral ETT  Additional Equipment:   Intra-op Plan:   Post-operative Plan: Extubation in OR  Informed Consent: I have reviewed the patients History and Physical, chart, labs and discussed the procedure including the risks, benefits and alternatives for the proposed anesthesia with the patient or authorized representative who has indicated his/her understanding and acceptance.   Dental advisory given  Plan Discussed with: CRNA, Anesthesiologist and Surgeon  Anesthesia Plan Comments:         Anesthesia Quick Evaluation

## 2011-04-21 LAB — GLUCOSE, CAPILLARY
Glucose-Capillary: 278 mg/dL — ABNORMAL HIGH (ref 70–99)
Glucose-Capillary: 274 mg/dL — ABNORMAL HIGH (ref 70–99)

## 2011-04-21 MED ORDER — PREDNISOLONE ACETATE 1 % OP SUSP: 1.0000 [drp] | Freq: Four times a day (QID) | OPHTHALMIC | Status: AC

## 2011-04-21 MED ORDER — HYDROCODONE-ACETAMINOPHEN 5-325 MG PO TABS: 1.0000 | ORAL_TABLET | ORAL | Status: AC | PRN

## 2011-04-21 MED ORDER — GATIFLOXACIN 0.5 % OP SOLN: 1.0000 [drp] | Freq: Four times a day (QID) | OPHTHALMIC | Status: DC

## 2011-04-21 MED ORDER — BACITRACIN-POLYMYXIN B 500-10000 UNIT/GM OP OINT: 1.0000 "application " | TOPICAL_OINTMENT | Freq: Four times a day (QID) | OPHTHALMIC | Status: AC

## 2011-04-21 NOTE — Progress Notes (Signed)
04/21/2011, 6:55 AM  Mental Status:  Awake, Alert, Oriented  Anterior segment: Cornea  Clear    Anterior Chamber Clear    Lens:   Clear, IOL, Cataract  Intra Ocular Pressure 19 mmHg with Tonopen  Vitreous: Clear 100%gas bubble  Retina:  Attached Good laser reaction  Hazy view  Impression: Excellent result Retina attached  Final Diagnosis: Principal Problem:  *Proliferative diabetic retinopathy of both eyes Active Problems:  Retinal detachment, traction   Plan: start post operative eye drops.  Discharge to home.  Give post operative instructions  Hayden Pedro 04/21/2011, 6:55 AM

## 2011-04-25 ENCOUNTER — Encounter (HOSPITAL_COMMUNITY): Payer: Self-pay | Admitting: Ophthalmology

## 2011-04-26 ENCOUNTER — Inpatient Hospital Stay (INDEPENDENT_AMBULATORY_CARE_PROVIDER_SITE_OTHER): Payer: Medicaid Other | Admitting: Ophthalmology

## 2011-04-26 DIAGNOSIS — H4310 Vitreous hemorrhage, unspecified eye: Secondary | ICD-10-CM

## 2011-04-26 DIAGNOSIS — E11359 Type 2 diabetes mellitus with proliferative diabetic retinopathy without macular edema: Secondary | ICD-10-CM

## 2011-04-26 DIAGNOSIS — E1139 Type 2 diabetes mellitus with other diabetic ophthalmic complication: Secondary | ICD-10-CM

## 2011-05-18 ENCOUNTER — Ambulatory Visit (INDEPENDENT_AMBULATORY_CARE_PROVIDER_SITE_OTHER): Payer: Medicaid Other | Admitting: Ophthalmology

## 2011-05-18 DIAGNOSIS — E1139 Type 2 diabetes mellitus with other diabetic ophthalmic complication: Secondary | ICD-10-CM

## 2011-05-18 DIAGNOSIS — E11359 Type 2 diabetes mellitus with proliferative diabetic retinopathy without macular edema: Secondary | ICD-10-CM

## 2011-07-27 ENCOUNTER — Encounter (INDEPENDENT_AMBULATORY_CARE_PROVIDER_SITE_OTHER): Payer: Medicaid Other | Admitting: Ophthalmology

## 2011-08-09 ENCOUNTER — Encounter (HOSPITAL_COMMUNITY): Payer: Self-pay

## 2011-08-09 ENCOUNTER — Emergency Department (HOSPITAL_COMMUNITY)
Admission: EM | Admit: 2011-08-09 | Discharge: 2011-08-09 | Disposition: A | Discharge status: Home / Self Care | Payer: Medicaid Other | Attending: Emergency Medicine | Admitting: Emergency Medicine

## 2011-08-09 DIAGNOSIS — L03317 Cellulitis of buttock: Secondary | ICD-10-CM | POA: Insufficient documentation

## 2011-08-09 DIAGNOSIS — L0390 Cellulitis, unspecified: Secondary | ICD-10-CM

## 2011-08-09 DIAGNOSIS — L0231 Cutaneous abscess of buttock: Secondary | ICD-10-CM | POA: Insufficient documentation

## 2011-08-09 DIAGNOSIS — Z794 Long term (current) use of insulin: Secondary | ICD-10-CM | POA: Insufficient documentation

## 2011-08-09 DIAGNOSIS — E119 Type 2 diabetes mellitus without complications: Secondary | ICD-10-CM | POA: Insufficient documentation

## 2011-08-09 LAB — GLUCOSE, CAPILLARY: Glucose-Capillary: 305 mg/dL — ABNORMAL HIGH (ref 70–99)

## 2011-08-09 MED ORDER — LEVOFLOXACIN 500 MG PO TABS
500.0000 mg | ORAL_TABLET | Freq: Once | ORAL | Status: AC
  Administered 2011-08-09: 500 mg via ORAL
  Filled 2011-08-09: qty 1

## 2011-08-09 MED ORDER — LEVOFLOXACIN 500 MG PO TABS: 500.0000 mg | ORAL_TABLET | Freq: Every day | ORAL | Status: AC

## 2011-08-09 MED ORDER — OXYCODONE-ACETAMINOPHEN 5-325 MG PO TABS: 1.0000 | ORAL_TABLET | ORAL | Status: AC | PRN

## 2011-08-09 MED ORDER — OXYCODONE-ACETAMINOPHEN 5-325 MG PO TABS
1.0000 | ORAL_TABLET | Freq: Once | ORAL | Status: AC
  Administered 2011-08-09: 1 via ORAL
  Filled 2011-08-09: qty 1

## 2011-08-09 NOTE — ED Notes (Signed)
C/o abscess to rt buttocks.

## 2011-08-09 NOTE — ED Provider Notes (Signed)
History     CSN: [id number]  Arrival date & time 08/09/11  0846   First MD Initiated Contact with Patient 08/09/11 [phone]      Chief Complaint  Patient presents with  . Abscess    (Consider location/radiation/quality/duration/timing/severity/associated sxs/prior treatment) HPI Comments: Patient c/o pain and redness to her right buttock for several days.  States she has hx of diabetes and frequent abscesses.  States pain feels similar to way previous abscesses have developed.  She denies fever, swelling, or vomiting.    Patient is a 41 y.o. female presenting with abscess. The history is provided by the patient. No language interpreter was used.  Abscess  This is a recurrent problem. The problem occurs continuously. The problem has been gradually worsening. The abscess is present on the right buttock. The problem is mild. The abscess is characterized by redness and painfulness. The abscess first occurred at home. Pertinent negatives include no fever. Her past medical history is significant for skin abscesses in family. There were no sick contacts. She has received no recent medical care.    Past Medical History  Diagnosis Date  . Chronic kidney disease protein high in urine     fo;;owed by pmp  . Hypertension   . Blood transfusion   . Anxiety   . Depression   . Diabetes mellitus   . Anemia     Past Surgical History  Procedure Date  . Dilation and curettage of uterus   . Cesarean section     x2  . Tubal ligation   . Eye surgery right eye pars plano vitrectomy .   Marland Kitchen Pars plana vitrectomy 04/20/2011    Procedure: PARS PLANA VITRECTOMY WITH 25 GAUGE;  Surgeon: Hayden Pedro, MD;  Location: [facility];  Service: Ophthalmology;  Laterality: Left;  membrane peel, gas fluid exchange, endolaser, repair of complex retinal detachment    No family history on file.  History  Substance Use Topics  . Smoking status: Former Research scientist (life sciences)  . Smokeless tobacco: Never Used  . Alcohol Use: No     OB History    Grav Para Term Preterm Abortions TAB SAB Ect Mult Living                  Review of Systems  Constitutional: Negative for fever, activity change, appetite change and fatigue.  Genitourinary: Negative for dysuria and difficulty urinating.  Musculoskeletal: Negative.   Skin:       abscess  Neurological: Negative for weakness and numbness.  All other systems reviewed and are negative.    Allergies  Penicillins  Home Medications   Current Outpatient Rx  Name Route Sig Dispense Refill  . AMLODIPINE BESY-BENAZEPRIL HCL 10-40 MG PO CAPS Oral Take 1 capsule by mouth daily.      . ATENOLOL 50 MG PO TABS Oral Take 50 mg by mouth 2 (two) times daily.     Marland Kitchen HYDROCHLOROTHIAZIDE 12.5 MG PO CAPS Oral Take 12.5 mg by mouth daily.    . INSULIN ASPART 100 UNIT/ML [place] SOLN Subcutaneous Inject 27 Units into the skin 3 (three) times daily before meals.     Marland Kitchen LAMOTRIGINE 25 MG PO TABS Oral Take 25 mg by mouth daily.    Marland Kitchen PRAVASTATIN SODIUM 40 MG PO TABS Oral Take 40 mg by mouth at bedtime.      Marland Kitchen SITAGLIPTIN-METFORMIN HCL 50-1000 MG PO TABS Oral Take 1 tablet by mouth 2 (two) times daily with a meal.      .  INSULIN GLARGINE 100 UNIT/ML [place] SOLN Subcutaneous Inject 70 Units into the skin at bedtime.        BP 163/96  Pulse 92  Temp(Src) 97.3 F (36.3 C) (Oral)  Resp 22  Ht 5\' 4"  (1.626 m)  Wt 262 lb (118.842 kg)  BMI 44.97 kg/m2  SpO2 100%  LMP 06/11/2011  Physical Exam  Nursing note and vitals reviewed. Constitutional: She is oriented to person, place, and time. She appears well-developed and well-nourished. No distress.  HENT:  Head: Normocephalic and atraumatic.  Mouth/Throat: Oropharynx is clear and moist.  Neck: Normal range of motion. Neck supple.  Cardiovascular: Normal rate, regular rhythm and normal heart sounds.   Pulmonary/Chest: Effort normal and breath sounds normal. No respiratory distress.  Musculoskeletal: Normal range of motion. She exhibits no edema  and no tenderness.       See skin exam  Lymphadenopathy:    She has no cervical adenopathy.  Neurological: She is alert and oriented to person, place, and time. She exhibits normal muscle tone. Coordination normal.  Skin: Skin is warm and dry.          Localized area of erythema and induration to the lateral right buttock.  No edema, or drainage     ED Course  Procedures (including critical care time)  Labs Reviewed  GLUCOSE, CAPILLARY - Abnormal; Notable for the following:    Glucose-Capillary 305 (*)    All other components within normal limits        MDM     Patient has tenderness to palpation with a localized area of cellulitis to her lateral right hip.  Mild to moderate induration without fluctuance or obvious abscess but probable abscess developing. She has a history of several previous abscesses. Patient prefers to try antibiotics and warm compresses at this time. She agrees to return to the emergency department in 1-2 days if her symptoms worsen for I&D.    Patient requests to be prescribed Levaquin. She states that she has tried multiple antibiotics in the past including Bactrim and doxycycline , and she states that Levaquin seems to be the most effective for her.  Patient's blood sugar is elevated but this morning but she states that it was low last evening and she did not take her evening dose of Lantus.  She is asymptomatic at this time, vitals stable, no clinical signs of DKA and she agrees to take her NovoLog when she gets home.    Yumi Insalaco L. [place], [place] 08/11/11 2157

## 2011-08-09 NOTE — Discharge Instructions (Signed)
Cellulitis Cellulitis is an infection of the tissue under the skin. The infected area is usually red and tender. This is caused by germs. These germs enter the body through cuts or sores. This usually happens in the arms or lower legs. HOME CARE   Take your medicine as told. Finish it even if you start to feel better.   If the infection is on the arm or leg, keep it raised (elevated).   Use a warm cloth on the infected area several times a day.   See your doctor for a follow-up visit as told.  GET HELP RIGHT AWAY IF:   You are tired or confused.   You throw up (vomit).   You have watery poop (diarrhea).   You feel ill and have muscle aches.   You have a fever.  MAKE SURE YOU:   Understand these instructions.   Will watch your condition.   Will get help right away if you are not doing well or get worse.  Document Released: 11/08/2007 Document Revised: 05/11/2011 Document Reviewed: 04/23/2009 [facility] Patient Information 2012 [place].

## 2011-08-13 NOTE — ED Provider Notes (Signed)
Medical screening examination/treatment/procedure(s) were performed by non-physician practitioner and as supervising physician I was immediately available for consultation/collaboration.   Mervin Kung, MD 08/13/11 2042

## 2012-01-12 ENCOUNTER — Encounter (INDEPENDENT_AMBULATORY_CARE_PROVIDER_SITE_OTHER): Payer: Medicaid Other | Admitting: Ophthalmology

## 2012-01-12 DIAGNOSIS — H2510 Age-related nuclear cataract, unspecified eye: Secondary | ICD-10-CM

## 2012-01-12 DIAGNOSIS — E1139 Type 2 diabetes mellitus with other diabetic ophthalmic complication: Secondary | ICD-10-CM

## 2012-01-12 DIAGNOSIS — E11359 Type 2 diabetes mellitus with proliferative diabetic retinopathy without macular edema: Secondary | ICD-10-CM

## 2012-04-11 ENCOUNTER — Emergency Department (HOSPITAL_COMMUNITY)
Admission: EM | Admit: 2012-04-11 | Discharge: 2012-04-11 | Disposition: A | Discharge status: Home / Self Care | Payer: Medicaid Other | Attending: Emergency Medicine | Admitting: Emergency Medicine

## 2012-04-11 ENCOUNTER — Encounter (HOSPITAL_COMMUNITY): Payer: Self-pay | Admitting: *Deleted

## 2012-04-11 ENCOUNTER — Emergency Department (HOSPITAL_COMMUNITY): Payer: Medicaid Other

## 2012-04-11 DIAGNOSIS — W1789XA Other fall from one level to another, initial encounter: Secondary | ICD-10-CM | POA: Insufficient documentation

## 2012-04-11 DIAGNOSIS — M2540 Effusion, unspecified joint: Secondary | ICD-10-CM | POA: Insufficient documentation

## 2012-04-11 DIAGNOSIS — I129 Hypertensive chronic kidney disease with stage 1 through stage 4 chronic kidney disease, or unspecified chronic kidney disease: Secondary | ICD-10-CM | POA: Insufficient documentation

## 2012-04-11 DIAGNOSIS — E119 Type 2 diabetes mellitus without complications: Secondary | ICD-10-CM | POA: Insufficient documentation

## 2012-04-11 DIAGNOSIS — M2550 Pain in unspecified joint: Secondary | ICD-10-CM | POA: Insufficient documentation

## 2012-04-11 DIAGNOSIS — Y929 Unspecified place or not applicable: Secondary | ICD-10-CM | POA: Insufficient documentation

## 2012-04-11 DIAGNOSIS — Z8659 Personal history of other mental and behavioral disorders: Secondary | ICD-10-CM | POA: Insufficient documentation

## 2012-04-11 DIAGNOSIS — S93409A Sprain of unspecified ligament of unspecified ankle, initial encounter: Secondary | ICD-10-CM | POA: Insufficient documentation

## 2012-04-11 DIAGNOSIS — S93401A Sprain of unspecified ligament of right ankle, initial encounter: Secondary | ICD-10-CM

## 2012-04-11 DIAGNOSIS — Y9389 Activity, other specified: Secondary | ICD-10-CM | POA: Insufficient documentation

## 2012-04-11 DIAGNOSIS — F329 Major depressive disorder, single episode, unspecified: Secondary | ICD-10-CM | POA: Insufficient documentation

## 2012-04-11 DIAGNOSIS — Z794 Long term (current) use of insulin: Secondary | ICD-10-CM | POA: Insufficient documentation

## 2012-04-11 DIAGNOSIS — Z87891 Personal history of nicotine dependence: Secondary | ICD-10-CM | POA: Insufficient documentation

## 2012-04-11 DIAGNOSIS — F3289 Other specified depressive episodes: Secondary | ICD-10-CM | POA: Insufficient documentation

## 2012-04-11 DIAGNOSIS — Z79899 Other long term (current) drug therapy: Secondary | ICD-10-CM | POA: Insufficient documentation

## 2012-04-11 DIAGNOSIS — Z862 Personal history of diseases of the blood and blood-forming organs and certain disorders involving the immune mechanism: Secondary | ICD-10-CM | POA: Insufficient documentation

## 2012-04-11 MED ORDER — OXYCODONE-ACETAMINOPHEN 5-325 MG PO TABS
1.0000 | ORAL_TABLET | Freq: Once | ORAL | Status: AC
  Administered 2012-04-11: 1 via ORAL
  Filled 2012-04-11: qty 1

## 2012-04-11 MED ORDER — OXYCODONE-ACETAMINOPHEN 5-325 MG PO TABS: 1.0000 | ORAL_TABLET | ORAL | Status: AC | PRN

## 2012-04-11 NOTE — ED Provider Notes (Signed)
History     CSN: [id number]  Arrival date & time 04/11/12  1213   First MD Initiated Contact with Patient 04/11/12 1345      Chief Complaint  Patient presents with  . Fall    (Consider location/radiation/quality/duration/timing/severity/associated sxs/prior treatment) HPI Comments: Samantha Pierce fell last night as she was ambulating, missing a step in the dark,  Landing in her right foot and ankle in inverted fashion.  She has continued pain and swelling despite using ice and elevation since the event.  She is able to weight bear but with discomfort.  She denies weakness and numbness in her distal foot and toes.  Pain is constant,  Throbbing, and worse when dangling and with weight bearing.  The history is provided by the patient.    Past Medical History  Diagnosis Date  . Hypertension   . Blood transfusion   . Anxiety   . Depression   . Diabetes mellitus   . Anemia   . Chronic kidney disease protein high in urine     fo;;owed by pmp    Past Surgical History  Procedure Date  . Dilation and curettage of uterus   . Cesarean section     x2  . Tubal ligation   . Pars plana vitrectomy 04/20/2011    Procedure: PARS PLANA VITRECTOMY WITH 25 GAUGE;  Surgeon: Hayden Pedro, MD;  Location: [place];  Service: Ophthalmology;  Laterality: Left;  membrane peel, gas fluid exchange, endolaser, repair of complex retinal detachment  . Eye surgery right eye pars plano vitrectomy .     History reviewed. No pertinent family history.  History  Substance Use Topics  . Smoking status: Former Research scientist (life sciences)  . Smokeless tobacco: Never Used  . Alcohol Use: No    OB History    Grav Para Term Preterm Abortions TAB SAB Ect Mult Living                  Review of Systems  Musculoskeletal: Positive for joint swelling and arthralgias.  Skin: Negative for wound.  Neurological: Negative for weakness and numbness.    Allergies  Penicillins  Home Medications   Current Outpatient Rx  Name   Route  Sig  Dispense  Refill  . AMLODIPINE BESY-BENAZEPRIL HCL 10-40 MG PO CAPS   Oral   Take 1 capsule by mouth daily.           . ATENOLOL 50 MG PO TABS   Oral   Take 50 mg by mouth 2 (two) times daily.          Marland Kitchen HYDROCHLOROTHIAZIDE 12.5 MG PO CAPS   Oral   Take 12.5 mg by mouth daily.         . INSULIN ASPART 100 UNIT/ML [place] SOLN   Subcutaneous   Inject 30 Units into the skin 3 (three) times daily before meals.          . INSULIN GLARGINE 100 UNIT/ML [place] SOLN   Subcutaneous   Inject 70 Units into the skin at bedtime.           Marland Kitchen LAMOTRIGINE 25 MG PO TABS   Oral   Take 25 mg by mouth daily.         Marland Kitchen PRAVASTATIN SODIUM 40 MG PO TABS   Oral   Take 40 mg by mouth at bedtime.           Marland Kitchen SITAGLIPTIN-METFORMIN HCL 50-1000 MG PO TABS   Oral   Take 1  tablet by mouth 2 (two) times daily with a meal.           . OXYCODONE-ACETAMINOPHEN 5-325 MG PO TABS   Oral   Take 1 tablet by mouth every 4 (four) hours as needed for pain.   20 tablet   0     BP 100/68  Pulse 94  Temp 98.5 F (36.9 C) (Oral)  Resp 18  Ht 5\' 4"  (1.626 m)  Wt 250 lb (113.399 kg)  BMI 42.91 kg/m2  SpO2 100%  LMP 03/30/2012  Physical Exam  Nursing note and vitals reviewed. Constitutional: She appears well-developed and well-nourished.  HENT:  Head: Normocephalic and atraumatic.  Neck: Normal range of motion.  Cardiovascular: Normal rate and intact distal pulses.  Exam reveals no decreased pulses.   Pulses:      Dorsalis pedis pulses are 2+ on the right side, and 2+ on the left side.       Posterior tibial pulses are 2+ on the right side, and 2+ on the left side.       Pulses equal bilaterally  Musculoskeletal: She exhibits edema and tenderness.       Right ankle: She exhibits decreased range of motion and swelling. She exhibits no ecchymosis, no deformity and normal pulse. tenderness. Lateral malleolus and medial malleolus tenderness found. No CF ligament, no head of 5th  metatarsal and no proximal fibula tenderness found. Achilles tendon normal.  Neurological: She is alert. She has normal strength. She displays normal reflexes. No sensory deficit.       Equal strength  Skin: Skin is warm, dry and intact.  Psychiatric: She has a normal mood and affect.    ED Course  Procedures (including critical care time)  Labs Reviewed - No data to display Dg Ankle Complete Right  04/11/2012  *RADIOLOGY REPORT*  Clinical Data: History of injury from fall with pain.  RIGHT ANKLE - COMPLETE 3+ VIEW  Comparison: Foot examination of same date.  Findings: There is slight soft tissue swelling.  The mortise is preserved.  No fracture or dislocation is evident.  There is a talar beak.  There is dorsal spurring of the navicula.  There is a small plantar calcaneal spur.  IMPRESSION: Soft tissue swelling.  No fracture or dislocation.  Calcaneal spurring.  Talar beak.   Original Report Authenticated By: Shanon Brow Call    Dg Foot Complete Right  04/11/2012  *RADIOLOGY REPORT*  Clinical Data: History of injury from fall with pain.  RIGHT FOOT COMPLETE - 3+ VIEW  Comparison: Ankle examination of same date.  Findings: There is moderate soft tissue swelling.  Alignment is normal.  No fracture or dislocation is evident.  There is a talar beak.  Minimal beginning spurring of dorsal aspect of navicula is seen.  Tiny calcaneal plantar spur is present.  IMPRESSION: Moderate soft tissue swelling.  No fracture or dislocation. Chronic findings detailed above.   Original Report Authenticated By: Shanon Brow Call      1. Right ankle sprain       MDM  ASO and crutches provided.  Cap refill normal after ASO applied.  RICE, f/u with pcp if pain symptoms and swelling are not better over the next 5-7  days.  Oxycodone,  Crutches supplied.  The patient appears reasonably screened and/or stabilized for discharge and I doubt any other medical condition or other [facility] requiring further screening, evaluation, or  treatment in the ED at this time prior to discharge.  Evalee Jefferson, PA 04/11/12 [place], PA 04/11/12 [phone]

## 2012-04-11 NOTE — ED Notes (Signed)
Fell down 1 step last pm,  Pain rt ankle and foot , with swelling

## 2012-04-11 NOTE — ED Provider Notes (Signed)
Medical screening examination/treatment/procedure(s) were performed by non-physician practitioner and as supervising physician I was immediately available for consultation/collaboration.   Ezequiel Essex, MD 04/11/12 [phone]

## 2012-07-15 ENCOUNTER — Ambulatory Visit (INDEPENDENT_AMBULATORY_CARE_PROVIDER_SITE_OTHER): Payer: Medicaid Other | Admitting: Ophthalmology

## 2012-11-12 ENCOUNTER — Other Ambulatory Visit (HOSPITAL_COMMUNITY): Payer: Self-pay | Admitting: Nephrology

## 2012-11-12 DIAGNOSIS — N289 Disorder of kidney and ureter, unspecified: Secondary | ICD-10-CM

## 2012-11-22 ENCOUNTER — Ambulatory Visit (HOSPITAL_COMMUNITY)
Admission: RE | Admit: 2012-11-22 | Discharge: 2012-11-22 | Disposition: A | Discharge status: Home / Self Care | Payer: Medicaid Other | Source: Ambulatory Visit | Attending: Nephrology | Admitting: Nephrology

## 2012-11-22 DIAGNOSIS — N189 Chronic kidney disease, unspecified: Secondary | ICD-10-CM | POA: Insufficient documentation

## 2012-11-22 DIAGNOSIS — N289 Disorder of kidney and ureter, unspecified: Secondary | ICD-10-CM

## 2012-11-22 DIAGNOSIS — E119 Type 2 diabetes mellitus without complications: Secondary | ICD-10-CM | POA: Insufficient documentation

## 2013-04-10 ENCOUNTER — Other Ambulatory Visit (HOSPITAL_COMMUNITY): Payer: Self-pay | Admitting: Family Medicine

## 2013-04-10 DIAGNOSIS — Z139 Encounter for screening, unspecified: Secondary | ICD-10-CM

## 2013-04-18 ENCOUNTER — Ambulatory Visit (HOSPITAL_COMMUNITY)
Admission: RE | Admit: 2013-04-18 | Discharge: 2013-04-18 | Disposition: A | Discharge status: Home / Self Care | Payer: Medicaid Other | Source: Ambulatory Visit | Attending: Family Medicine | Admitting: Family Medicine

## 2013-04-18 DIAGNOSIS — Z139 Encounter for screening, unspecified: Secondary | ICD-10-CM

## 2013-04-18 DIAGNOSIS — Z1231 Encounter for screening mammogram for malignant neoplasm of breast: Secondary | ICD-10-CM | POA: Insufficient documentation

## 2014-01-06 ENCOUNTER — Encounter (INDEPENDENT_AMBULATORY_CARE_PROVIDER_SITE_OTHER): Payer: Medicaid Other | Admitting: Ophthalmology

## 2014-01-06 DIAGNOSIS — E1065 Type 1 diabetes mellitus with hyperglycemia: Secondary | ICD-10-CM

## 2014-01-06 DIAGNOSIS — I10 Essential (primary) hypertension: Secondary | ICD-10-CM

## 2014-01-06 DIAGNOSIS — E11359 Type 2 diabetes mellitus with proliferative diabetic retinopathy without macular edema: Secondary | ICD-10-CM

## 2014-01-06 DIAGNOSIS — E1039 Type 1 diabetes mellitus with other diabetic ophthalmic complication: Secondary | ICD-10-CM

## 2014-01-06 DIAGNOSIS — H2510 Age-related nuclear cataract, unspecified eye: Secondary | ICD-10-CM

## 2014-01-06 DIAGNOSIS — H35039 Hypertensive retinopathy, unspecified eye: Secondary | ICD-10-CM

## 2014-01-22 ENCOUNTER — Telehealth (HOSPITAL_COMMUNITY): Payer: Self-pay | Admitting: Dietician

## 2014-01-22 NOTE — Telephone Encounter (Signed)
Ms. Samantha Pierce left a message on voicemail at [phone]. Called back at 1400. She reports she is looking for a diabetes support group; she has had diabetes for 20 years and has taken multiple education classes. Informed Ms. Samantha Pierce that this RD is unaware of any support groups in the [facility] area, however, there are some resources in [place]. Ms. Samantha Pierce then inquired about diabetes education classes- informed her that services are limited at this time due to transition of services to [place]. Offered class on 01/22/14, however, she reports she is unable to make it. Informed her no other classes will be available in August. Tentatively registered for 02/12/14 class. Again made Ms. Samantha Pierce aware of [place] for additional support.

## 2014-02-11 ENCOUNTER — Other Ambulatory Visit (HOSPITAL_COMMUNITY): Payer: Self-pay | Admitting: Nephrology

## 2014-02-11 ENCOUNTER — Ambulatory Visit (HOSPITAL_COMMUNITY)
Admission: RE | Admit: 2014-02-11 | Discharge: 2014-02-11 | Disposition: A | Discharge status: Home / Self Care | Payer: Medicaid Other | Source: Ambulatory Visit | Attending: Vascular Surgery | Admitting: Vascular Surgery

## 2014-02-11 DIAGNOSIS — N189 Chronic kidney disease, unspecified: Secondary | ICD-10-CM | POA: Insufficient documentation

## 2014-02-11 DIAGNOSIS — I701 Atherosclerosis of renal artery: Secondary | ICD-10-CM | POA: Insufficient documentation

## 2014-06-20 ENCOUNTER — Emergency Department (HOSPITAL_COMMUNITY): Payer: Medicaid Other

## 2014-06-20 ENCOUNTER — Emergency Department (HOSPITAL_COMMUNITY)
Admission: EM | Admit: 2014-06-20 | Discharge: 2014-06-20 | Disposition: A | Discharge status: Home / Self Care | Payer: Medicaid Other | Attending: Emergency Medicine | Admitting: Emergency Medicine

## 2014-06-20 ENCOUNTER — Encounter (HOSPITAL_COMMUNITY): Payer: Self-pay | Admitting: Emergency Medicine

## 2014-06-20 DIAGNOSIS — E119 Type 2 diabetes mellitus without complications: Secondary | ICD-10-CM | POA: Insufficient documentation

## 2014-06-20 DIAGNOSIS — Z794 Long term (current) use of insulin: Secondary | ICD-10-CM | POA: Diagnosis not present

## 2014-06-20 DIAGNOSIS — N189 Chronic kidney disease, unspecified: Secondary | ICD-10-CM | POA: Insufficient documentation

## 2014-06-20 DIAGNOSIS — J40 Bronchitis, not specified as acute or chronic: Secondary | ICD-10-CM

## 2014-06-20 DIAGNOSIS — Z862 Personal history of diseases of the blood and blood-forming organs and certain disorders involving the immune mechanism: Secondary | ICD-10-CM | POA: Diagnosis not present

## 2014-06-20 DIAGNOSIS — Z79899 Other long term (current) drug therapy: Secondary | ICD-10-CM | POA: Insufficient documentation

## 2014-06-20 DIAGNOSIS — I129 Hypertensive chronic kidney disease with stage 1 through stage 4 chronic kidney disease, or unspecified chronic kidney disease: Secondary | ICD-10-CM | POA: Insufficient documentation

## 2014-06-20 DIAGNOSIS — R079 Chest pain, unspecified: Secondary | ICD-10-CM | POA: Diagnosis present

## 2014-06-20 DIAGNOSIS — J209 Acute bronchitis, unspecified: Secondary | ICD-10-CM | POA: Diagnosis not present

## 2014-06-20 DIAGNOSIS — F329 Major depressive disorder, single episode, unspecified: Secondary | ICD-10-CM | POA: Diagnosis not present

## 2014-06-20 DIAGNOSIS — Z87891 Personal history of nicotine dependence: Secondary | ICD-10-CM | POA: Insufficient documentation

## 2014-06-20 DIAGNOSIS — E669 Obesity, unspecified: Secondary | ICD-10-CM | POA: Diagnosis not present

## 2014-06-20 DIAGNOSIS — F419 Anxiety disorder, unspecified: Secondary | ICD-10-CM | POA: Insufficient documentation

## 2014-06-20 LAB — COMPREHENSIVE METABOLIC PANEL
ALT: 15 U/L (ref 0–35)
AST: 19 U/L (ref 0–37)
Albumin: 2.3 g/dL — ABNORMAL LOW (ref 3.5–5.2)
Alkaline Phosphatase: 45 U/L (ref 39–117)
Anion gap: 4 — ABNORMAL LOW (ref 5–15)
BUN: 19 mg/dL (ref 6–23)
CO2: 27 mmol/L (ref 19–32)
Calcium: 7.7 mg/dL — ABNORMAL LOW (ref 8.4–10.5)
Chloride: 102 mEq/L (ref 96–112)
Creatinine, Ser: 1.15 mg/dL — ABNORMAL HIGH (ref 0.50–1.10)
GFR calc Af Amer: 66 mL/min — ABNORMAL LOW (ref >90)
GFR calc non Af Amer: 57 mL/min — ABNORMAL LOW (ref >90)
Glucose, Bld: 243 mg/dL — ABNORMAL HIGH (ref 70–99)
Potassium: 3.9 mmol/L (ref 3.5–5.1)
Sodium: 133 mmol/L — ABNORMAL LOW (ref 135–145)
Total Bilirubin: 0.3 mg/dL (ref 0.3–1.2)
Total Protein: 5.2 g/dL — ABNORMAL LOW (ref 6.0–8.3)

## 2014-06-20 LAB — CBC WITH DIFFERENTIAL/PLATELET
Basophils Absolute: 0.1 10*3/uL (ref 0.0–0.1)
Basophils Relative: 1 % (ref 0–1)
Eosinophils Absolute: 0.4 10*3/uL (ref 0.0–0.7)
Eosinophils Relative: 5 % (ref 0–5)
HCT: 34.5 % — ABNORMAL LOW (ref 36.0–46.0)
Hemoglobin: 11.2 g/dL — ABNORMAL LOW (ref 12.0–15.0)
Lymphocytes Relative: 22 % (ref 12–46)
Lymphs Abs: 1.9 10*3/uL (ref 0.7–4.0)
MCH: 25.7 pg — ABNORMAL LOW (ref 26.0–34.0)
MCHC: 32.5 g/dL (ref 30.0–36.0)
MCV: 79.3 fL (ref 78.0–100.0)
Monocytes Absolute: 0.7 10*3/uL (ref 0.1–1.0)
Monocytes Relative: 8 % (ref 3–12)
Neutro Abs: 5.5 10*3/uL (ref 1.7–7.7)
Neutrophils Relative %: 64 % (ref 43–77)
Platelets: 256 10*3/uL (ref 150–400)
RBC: 4.35 MIL/uL (ref 3.87–5.11)
RDW: 12.9 % (ref 11.5–15.5)
WBC: 8.6 10*3/uL (ref 4.0–10.5)

## 2014-06-20 LAB — TROPONIN I: Troponin I: <0.03 ng/mL (ref <0.031)

## 2014-06-20 MED ORDER — ALBUTEROL SULFATE HFA 108 (90 BASE) MCG/ACT IN AERS: 1.0000 | INHALATION_SPRAY | Freq: Four times a day (QID) | RESPIRATORY_TRACT | Status: DC | PRN

## 2014-06-20 MED ORDER — AZITHROMYCIN 250 MG PO TABS: ORAL_TABLET | ORAL | Status: DC

## 2014-06-20 NOTE — Discharge Instructions (Signed)
We will treat you for bronchitis with antibiotic and inhaler. Increase fluids. Continue to stop smoking. Follow-up with your primary care doctor

## 2014-06-20 NOTE — ED Provider Notes (Signed)
CSN: [id number]     Arrival date & time 06/20/14  0945 History   This chart was scribed for Nat Christen, MD by Evelene Croon, ED Scribe. This patient was seen in room APA10/APA10 and the patient's care was started 10:09 AM.    Chief Complaint  Patient presents with  . Chest Pain     The history is provided by the patient. No language interpreter was used.     HPI Comments:  Samantha Pierce is a 44 y.o. female who presents to the Emergency Department complaining of productive cough with associated right sided CP that radiates to her right axilla for 5 days. She reports a h/o of 3 episodes of the same since mid November 2015. In the past she has been treated with z-pack with relief.  She denies fever, SOB and acute BLE edema. No alleviating factors noted for this episode.Pt notes she recently quit smoking.  PCP at [facility]  Past Medical History  Diagnosis Date  . Hypertension   . Blood transfusion   . Anxiety   . Depression   . Diabetes mellitus   . Anemia   . Chronic kidney disease protein high in urine     fo;;owed by pmp   Past Surgical History  Procedure Laterality Date  . Dilation and curettage of uterus    . Cesarean section      x2  . Tubal ligation    . Pars plana vitrectomy  04/20/2011    Procedure: PARS PLANA VITRECTOMY WITH 25 GAUGE;  Surgeon: Hayden Pedro, MD;  Location: [place];  Service: Ophthalmology;  Laterality: Left;  membrane peel, gas fluid exchange, endolaser, repair of complex retinal detachment  . Eye surgery  right eye pars plano vitrectomy .   Marland Kitchen Lasik     Family History  Problem Relation Age of Onset  . Diabetes Other    History  Substance Use Topics  . Smoking status: Former Smoker -- 0.03 packs/day for 30 years    Types: Cigarettes    Quit date: 04/04/2014  . Smokeless tobacco: Never Used  . Alcohol Use: No   OB History    Gravida Para Term Preterm AB TAB SAB Ectopic Multiple Living   2 2 2       2      Review of Systems   10  Systems reviewed and are negative for acute change except as noted in the HPI.    Allergies  Penicillins  Home Medications   Prior to Admission medications   Medication Sig Start Date End Date Taking? Authorizing Provider  cloNIDine (CATAPRES) 0.2 MG tablet Take 0.2 mg by mouth daily.   Yes Historical Provider, MD  FLUoxetine (PROZAC) 20 MG capsule Take 20 mg by mouth daily.   Yes Historical Provider, MD  insulin aspart (NOVOLOG) 100 UNIT/ML injection Inject 0-18 Units into the skin 3 (three) times daily before meals. Sliding Scale   Yes Historical Provider, MD  insulin detemir (LEVEMIR) 100 UNIT/ML injection Inject 70 Units into the skin at bedtime.   Yes Historical Provider, MD  lisinopril (PRINIVIL,ZESTRIL) 10 MG tablet Take 10 mg by mouth daily.   Yes Historical Provider, MD  lithium 300 MG tablet Take 300 mg by mouth daily.   Yes Historical Provider, MD  metformin (FORTAMET) 1000 MG (OSM) 24 hr tablet Take 1,000 mg by mouth 2 (two) times daily with a meal.   Yes Historical Provider, MD  pravastatin (PRAVACHOL) 40 MG tablet Take 40 mg by  mouth at bedtime.     Yes Historical Provider, MD  traZODone (DESYREL) 100 MG tablet Take 100 mg by mouth at bedtime.   Yes Historical Provider, MD  albuterol (PROVENTIL HFA;VENTOLIN HFA) 108 (90 BASE) MCG/ACT inhaler Inhale 1-2 puffs into the lungs every 6 (six) hours as needed for wheezing or shortness of breath. 06/20/14   Nat Christen, MD  azithromycin (ZITHROMAX Z-PAK) 250 MG tablet 2 po day one, then 1 daily x 4 days 06/20/14   Nat Christen, MD   BP 154/99 mmHg  Pulse 67  Temp(Src) 98.2 F (36.8 C) (Oral)  Resp 23  Ht 5\' 4"  (1.626 m)  Wt 264 lb (119.75 kg)  BMI 45.29 kg/m2  SpO2 99%  LMP 06/05/2014 Physical Exam  Constitutional: She is oriented to person, place, and time.  Obese  HENT:  Head: Normocephalic and atraumatic.  Eyes: Conjunctivae and EOM are normal. Pupils are equal, round, and reactive to light.  Neck: Normal range of motion.  Neck supple.  Cardiovascular: Normal rate and regular rhythm.   Pulmonary/Chest: Effort normal and breath sounds normal.  Abdominal: Soft. Bowel sounds are normal.  Musculoskeletal: Normal range of motion.  Neurological: She is alert and oriented to person, place, and time.  Skin: Skin is warm and dry.  Psychiatric: She has a normal mood and affect. Her behavior is normal.  Nursing note and vitals reviewed.   ED Course  Procedures   DIAGNOSTIC STUDIES:  Oxygen Saturation is 99% on RA, normal by my interpretation.    COORDINATION OF CARE:  10:13 AM Will order breathing treatment and start pt on zithromax and albuterol inhaler. Discussed treatment plan with pt at bedside and pt agreed to plan.  Labs Review Labs Reviewed  CBC WITH DIFFERENTIAL - Abnormal; Notable for the following:    Hemoglobin 11.2 (*)    HCT 34.5 (*)    MCH 25.7 (*)    All other components within normal limits  COMPREHENSIVE METABOLIC PANEL - Abnormal; Notable for the following:    Sodium 133 (*)    Glucose, Bld 243 (*)    Creatinine, Ser 1.15 (*)    Calcium 7.7 (*)    Total Protein 5.2 (*)    Albumin 2.3 (*)    GFR calc non Af Amer 57 (*)    GFR calc Af Amer 66 (*)    Anion gap 4 (*)    All other components within normal limits  TROPONIN I    Imaging Review Dg Chest 2 View  06/20/2014   CLINICAL DATA:  44 year old female with chest pain, shortness of breath, cough and vomiting  EXAM: CHEST  2 VIEW  COMPARISON:  Prior chest x-ray 10/25/2010  FINDINGS: Top normal heart size. No focal airspace consolidation, pulmonary edema, pleural effusion or pneumothorax. The appearance of bibasilar opacities on the frontal view is secondary to superimposition of the overlying soft tissues. No acute osseous abnormality.  IMPRESSION: 1. Top normal heart size. 2. Otherwise, unremarkable chest x-ray.   Electronically Signed   By: Jacqulynn Cadet M.D.   On: 06/20/2014 11:25     EKG Interpretation   Date/Time:   Saturday June 20 2014 10:02:27 EST Ventricular Rate:  74 PR Interval:  167 QRS Duration: 80 QT Interval:  377 QTC Calculation: 418 R Axis:   26 Text Interpretation:  Sinus rhythm Confirmed by Lacinda Axon  MD, Agustus Mane ([id number]) on  06/20/2014 10:44:12 AM      MDM   Final diagnoses:  Bronchitis  History and physical consistent with bronchitis. Patient is oxygenating well. Discharge medications Zithromax, albuterol inhaler  I personally performed the services described in this documentation, which was scribed in my presence. The recorded information has been reviewed and is accurate.    Nat Christen, MD 06/21/14 [phone]

## 2014-06-20 NOTE — ED Notes (Signed)
MD at bedside. 

## 2014-06-20 NOTE — ED Notes (Signed)
Patient c/o right side chest pain that radiates into right arm. Patient reports cough starting "a few days" before chest pain with thick white sputum causing her to vomit. Patient does report some shortness of breath. Denies any fevers. Denies any cardiac hx. Per patient has had bronchitis/URI once in November and once in December that feels similar except for chest pain. Denies any hx of pneumonia.

## 2014-07-30 ENCOUNTER — Other Ambulatory Visit (HOSPITAL_COMMUNITY): Payer: Self-pay | Admitting: Nurse Practitioner

## 2014-07-30 ENCOUNTER — Ambulatory Visit (HOSPITAL_COMMUNITY)
Admission: RE | Admit: 2014-07-30 | Discharge: 2014-07-30 | Disposition: A | Discharge status: Home / Self Care | Payer: Medicaid Other | Source: Ambulatory Visit | Attending: Nurse Practitioner | Admitting: Nurse Practitioner

## 2014-07-30 DIAGNOSIS — J988 Other specified respiratory disorders: Secondary | ICD-10-CM

## 2014-07-30 DIAGNOSIS — R079 Chest pain, unspecified: Secondary | ICD-10-CM | POA: Diagnosis not present

## 2014-07-30 DIAGNOSIS — E119 Type 2 diabetes mellitus without complications: Secondary | ICD-10-CM | POA: Diagnosis not present

## 2014-07-30 DIAGNOSIS — Z87891 Personal history of nicotine dependence: Secondary | ICD-10-CM | POA: Diagnosis not present

## 2014-07-30 DIAGNOSIS — R05 Cough: Secondary | ICD-10-CM | POA: Diagnosis not present

## 2014-09-03 ENCOUNTER — Inpatient Hospital Stay (HOSPITAL_COMMUNITY)
Admission: EM | Admit: 2014-09-03 | Discharge: 2014-09-05 | DRG: 065 | Disposition: A | Discharge status: Home / Self Care | Payer: Medicaid Other | Attending: Internal Medicine | Admitting: Internal Medicine

## 2014-09-03 ENCOUNTER — Emergency Department (HOSPITAL_COMMUNITY): Payer: Medicaid Other

## 2014-09-03 ENCOUNTER — Encounter (HOSPITAL_COMMUNITY): Payer: Self-pay | Admitting: Emergency Medicine

## 2014-09-03 DIAGNOSIS — Z794 Long term (current) use of insulin: Secondary | ICD-10-CM

## 2014-09-03 DIAGNOSIS — Z87891 Personal history of nicotine dependence: Secondary | ICD-10-CM

## 2014-09-03 DIAGNOSIS — I5189 Other ill-defined heart diseases: Secondary | ICD-10-CM | POA: Diagnosis present

## 2014-09-03 DIAGNOSIS — E11319 Type 2 diabetes mellitus with unspecified diabetic retinopathy without macular edema: Secondary | ICD-10-CM | POA: Diagnosis present

## 2014-09-03 DIAGNOSIS — N179 Acute kidney failure, unspecified: Secondary | ICD-10-CM | POA: Diagnosis present

## 2014-09-03 DIAGNOSIS — N289 Disorder of kidney and ureter, unspecified: Secondary | ICD-10-CM

## 2014-09-03 DIAGNOSIS — I129 Hypertensive chronic kidney disease with stage 1 through stage 4 chronic kidney disease, or unspecified chronic kidney disease: Secondary | ICD-10-CM | POA: Diagnosis present

## 2014-09-03 DIAGNOSIS — Z23 Encounter for immunization: Secondary | ICD-10-CM

## 2014-09-03 DIAGNOSIS — I639 Cerebral infarction, unspecified: Principal | ICD-10-CM

## 2014-09-03 DIAGNOSIS — R209 Unspecified disturbances of skin sensation: Secondary | ICD-10-CM | POA: Diagnosis present

## 2014-09-03 DIAGNOSIS — Z6841 Body Mass Index (BMI) 40.0 and over, adult: Secondary | ICD-10-CM

## 2014-09-03 DIAGNOSIS — E1121 Type 2 diabetes mellitus with diabetic nephropathy: Secondary | ICD-10-CM | POA: Diagnosis present

## 2014-09-03 DIAGNOSIS — N183 Chronic kidney disease, stage 3 unspecified: Secondary | ICD-10-CM | POA: Diagnosis present

## 2014-09-03 DIAGNOSIS — R739 Hyperglycemia, unspecified: Secondary | ICD-10-CM

## 2014-09-03 DIAGNOSIS — I10 Essential (primary) hypertension: Secondary | ICD-10-CM | POA: Diagnosis present

## 2014-09-03 DIAGNOSIS — J449 Chronic obstructive pulmonary disease, unspecified: Secondary | ICD-10-CM | POA: Diagnosis present

## 2014-09-03 DIAGNOSIS — N182 Chronic kidney disease, stage 2 (mild): Secondary | ICD-10-CM

## 2014-09-03 DIAGNOSIS — E785 Hyperlipidemia, unspecified: Secondary | ICD-10-CM | POA: Diagnosis present

## 2014-09-03 DIAGNOSIS — E1165 Type 2 diabetes mellitus with hyperglycemia: Secondary | ICD-10-CM | POA: Diagnosis present

## 2014-09-03 DIAGNOSIS — IMO0001 Reserved for inherently not codable concepts without codable children: Secondary | ICD-10-CM

## 2014-09-03 DIAGNOSIS — IMO0002 Reserved for concepts with insufficient information to code with codable children: Secondary | ICD-10-CM | POA: Diagnosis present

## 2014-09-03 DIAGNOSIS — E113593 Type 2 diabetes mellitus with proliferative diabetic retinopathy without macular edema, bilateral: Secondary | ICD-10-CM

## 2014-09-03 DIAGNOSIS — N184 Chronic kidney disease, stage 4 (severe): Secondary | ICD-10-CM | POA: Diagnosis present

## 2014-09-03 DIAGNOSIS — E1122 Type 2 diabetes mellitus with diabetic chronic kidney disease: Secondary | ICD-10-CM | POA: Diagnosis present

## 2014-09-03 DIAGNOSIS — I6381 Other cerebral infarction due to occlusion or stenosis of small artery: Secondary | ICD-10-CM | POA: Diagnosis present

## 2014-09-03 DIAGNOSIS — E669 Obesity, unspecified: Secondary | ICD-10-CM

## 2014-09-03 DIAGNOSIS — Z833 Family history of diabetes mellitus: Secondary | ICD-10-CM

## 2014-09-03 HISTORY — DX: Chronic obstructive pulmonary disease, unspecified: J44.9

## 2014-09-03 HISTORY — DX: Type 2 diabetes mellitus with unspecified diabetic retinopathy without macular edema: E11.319

## 2014-09-03 LAB — URINALYSIS, ROUTINE W REFLEX MICROSCOPIC
Bilirubin Urine: NEGATIVE
Glucose, UA: 100 mg/dL — AB
Ketones, ur: NEGATIVE mg/dL
Leukocytes, UA: NEGATIVE
Nitrite: NEGATIVE
Protein, ur: >300 mg/dL — AB
Specific Gravity, Urine: 1.02 (ref 1.005–1.030)
Urobilinogen, UA: 0.2 mg/dL (ref 0.0–1.0)
pH: 7 (ref 5.0–8.0)

## 2014-09-03 LAB — BASIC METABOLIC PANEL
Anion gap: 6 (ref 5–15)
BUN: 19 mg/dL (ref 6–23)
CO2: 25 mmol/L (ref 19–32)
Calcium: 8 mg/dL — ABNORMAL LOW (ref 8.4–10.5)
Chloride: 106 mmol/L (ref 96–112)
Creatinine, Ser: 1.52 mg/dL — ABNORMAL HIGH (ref 0.50–1.10)
GFR calc Af Amer: 47 mL/min — ABNORMAL LOW (ref >90)
GFR calc non Af Amer: 41 mL/min — ABNORMAL LOW (ref >90)
Glucose, Bld: 213 mg/dL — ABNORMAL HIGH (ref 70–99)
Potassium: 3.8 mmol/L (ref 3.5–5.1)
Sodium: 137 mmol/L (ref 135–145)

## 2014-09-03 LAB — TROPONIN I: Troponin I: <0.03 ng/mL (ref <0.031)

## 2014-09-03 LAB — URINE MICROSCOPIC-ADD ON

## 2014-09-03 LAB — CBC WITH DIFFERENTIAL/PLATELET
Basophils Absolute: 0 10*3/uL (ref 0.0–0.1)
Basophils Relative: 0 % (ref 0–1)
Eosinophils Absolute: 0.5 10*3/uL (ref 0.0–0.7)
Eosinophils Relative: 5 % (ref 0–5)
HCT: 37.9 % (ref 36.0–46.0)
Hemoglobin: 12.3 g/dL (ref 12.0–15.0)
Lymphocytes Relative: 25 % (ref 12–46)
Lymphs Abs: 2.5 10*3/uL (ref 0.7–4.0)
MCH: 25.5 pg — ABNORMAL LOW (ref 26.0–34.0)
MCHC: 32.5 g/dL (ref 30.0–36.0)
MCV: 78.5 fL (ref 78.0–100.0)
Monocytes Absolute: 0.9 10*3/uL (ref 0.1–1.0)
Monocytes Relative: 9 % (ref 3–12)
Neutro Abs: 6.2 10*3/uL (ref 1.7–7.7)
Neutrophils Relative %: 61 % (ref 43–77)
Platelets: 305 10*3/uL (ref 150–400)
RBC: 4.83 MIL/uL (ref 3.87–5.11)
RDW: 13.1 % (ref 11.5–15.5)
WBC: 10 10*3/uL (ref 4.0–10.5)

## 2014-09-03 LAB — POC URINE PREG, ED: Preg Test, Ur: NEGATIVE

## 2014-09-03 LAB — PROTIME-INR
INR: 0.99 (ref 0.00–1.49)
Prothrombin Time: 13.2 seconds (ref 11.6–15.2)

## 2014-09-03 LAB — APTT: aPTT: 28 seconds (ref 24–37)

## 2014-09-03 MED ORDER — ACETAMINOPHEN 325 MG PO TABS
650.0000 mg | ORAL_TABLET | Freq: Once | ORAL | Status: AC
  Administered 2014-09-03: 650 mg via ORAL
  Filled 2014-09-03: qty 2

## 2014-09-03 MED ORDER — HYDROCHLOROTHIAZIDE 12.5 MG PO CAPS
12.5000 mg | ORAL_CAPSULE | Freq: Every day | ORAL | Status: DC
  Administered 2014-09-03: 12.5 mg via ORAL
  Filled 2014-09-03 (×2): qty 1

## 2014-09-03 MED ORDER — LISINOPRIL 10 MG PO TABS
20.0000 mg | ORAL_TABLET | Freq: Once | ORAL | Status: AC
  Administered 2014-09-03: 20 mg via ORAL
  Filled 2014-09-03: qty 2

## 2014-09-03 NOTE — ED Notes (Signed)
Patient states she has had right sided facial numbness and right hand numbness since Saturday.  Patient states that she saw her primary MD on Monday and was told she might have had a stroke and sent her home to take ASA everyday.  Patient continues to c/o numbness to face and hand.

## 2014-09-04 ENCOUNTER — Observation Stay (HOSPITAL_COMMUNITY): Payer: Medicaid Other

## 2014-09-04 ENCOUNTER — Encounter (HOSPITAL_COMMUNITY): Payer: Self-pay | Admitting: Internal Medicine

## 2014-09-04 DIAGNOSIS — I639 Cerebral infarction, unspecified: Secondary | ICD-10-CM | POA: Diagnosis not present

## 2014-09-04 DIAGNOSIS — IMO0002 Reserved for concepts with insufficient information to code with codable children: Secondary | ICD-10-CM | POA: Diagnosis present

## 2014-09-04 DIAGNOSIS — I129 Hypertensive chronic kidney disease with stage 1 through stage 4 chronic kidney disease, or unspecified chronic kidney disease: Secondary | ICD-10-CM | POA: Diagnosis present

## 2014-09-04 DIAGNOSIS — Z87891 Personal history of nicotine dependence: Secondary | ICD-10-CM | POA: Diagnosis not present

## 2014-09-04 DIAGNOSIS — E08319 Diabetes mellitus due to underlying condition with unspecified diabetic retinopathy without macular edema: Secondary | ICD-10-CM | POA: Diagnosis not present

## 2014-09-04 DIAGNOSIS — I10 Essential (primary) hypertension: Secondary | ICD-10-CM | POA: Diagnosis present

## 2014-09-04 DIAGNOSIS — E669 Obesity, unspecified: Secondary | ICD-10-CM | POA: Diagnosis present

## 2014-09-04 DIAGNOSIS — E1121 Type 2 diabetes mellitus with diabetic nephropathy: Secondary | ICD-10-CM | POA: Diagnosis present

## 2014-09-04 DIAGNOSIS — E1122 Type 2 diabetes mellitus with diabetic chronic kidney disease: Secondary | ICD-10-CM | POA: Diagnosis present

## 2014-09-04 DIAGNOSIS — Z6841 Body Mass Index (BMI) 40.0 and over, adult: Secondary | ICD-10-CM | POA: Diagnosis not present

## 2014-09-04 DIAGNOSIS — Z833 Family history of diabetes mellitus: Secondary | ICD-10-CM | POA: Diagnosis not present

## 2014-09-04 DIAGNOSIS — N182 Chronic kidney disease, stage 2 (mild): Secondary | ICD-10-CM | POA: Diagnosis present

## 2014-09-04 DIAGNOSIS — IMO0001 Reserved for inherently not codable concepts without codable children: Secondary | ICD-10-CM | POA: Diagnosis present

## 2014-09-04 DIAGNOSIS — R200 Anesthesia of skin: Secondary | ICD-10-CM | POA: Diagnosis present

## 2014-09-04 DIAGNOSIS — N183 Chronic kidney disease, stage 3 unspecified: Secondary | ICD-10-CM | POA: Diagnosis present

## 2014-09-04 DIAGNOSIS — I6381 Other cerebral infarction due to occlusion or stenosis of small artery: Secondary | ICD-10-CM | POA: Diagnosis present

## 2014-09-04 DIAGNOSIS — E785 Hyperlipidemia, unspecified: Secondary | ICD-10-CM | POA: Diagnosis present

## 2014-09-04 DIAGNOSIS — G464 Cerebellar stroke syndrome: Secondary | ICD-10-CM

## 2014-09-04 DIAGNOSIS — E11319 Type 2 diabetes mellitus with unspecified diabetic retinopathy without macular edema: Secondary | ICD-10-CM | POA: Diagnosis present

## 2014-09-04 DIAGNOSIS — R209 Unspecified disturbances of skin sensation: Secondary | ICD-10-CM

## 2014-09-04 DIAGNOSIS — Z8673 Personal history of transient ischemic attack (TIA), and cerebral infarction without residual deficits: Secondary | ICD-10-CM

## 2014-09-04 DIAGNOSIS — N184 Chronic kidney disease, stage 4 (severe): Secondary | ICD-10-CM | POA: Diagnosis present

## 2014-09-04 DIAGNOSIS — Z23 Encounter for immunization: Secondary | ICD-10-CM | POA: Diagnosis not present

## 2014-09-04 DIAGNOSIS — N179 Acute kidney failure, unspecified: Secondary | ICD-10-CM | POA: Diagnosis present

## 2014-09-04 DIAGNOSIS — J449 Chronic obstructive pulmonary disease, unspecified: Secondary | ICD-10-CM | POA: Diagnosis present

## 2014-09-04 DIAGNOSIS — E1165 Type 2 diabetes mellitus with hyperglycemia: Secondary | ICD-10-CM | POA: Diagnosis present

## 2014-09-04 DIAGNOSIS — Z794 Long term (current) use of insulin: Secondary | ICD-10-CM | POA: Diagnosis not present

## 2014-09-04 HISTORY — DX: Personal history of transient ischemic attack (TIA), and cerebral infarction without residual deficits: Z86.73

## 2014-09-04 LAB — RAPID URINE DRUG SCREEN, HOSP PERFORMED
Amphetamines: NOT DETECTED
Barbiturates: NOT DETECTED
Benzodiazepines: NOT DETECTED
Cocaine: NOT DETECTED
Opiates: NOT DETECTED
Tetrahydrocannabinol: NOT DETECTED

## 2014-09-04 LAB — GLUCOSE, CAPILLARY
Glucose-Capillary: 240 mg/dL — ABNORMAL HIGH (ref 70–99)
Glucose-Capillary: 205 mg/dL — ABNORMAL HIGH (ref 70–99)
Glucose-Capillary: 253 mg/dL — ABNORMAL HIGH (ref 70–99)
Glucose-Capillary: 179 mg/dL — ABNORMAL HIGH (ref 70–99)
Glucose-Capillary: 246 mg/dL — ABNORMAL HIGH (ref 70–99)

## 2014-09-04 LAB — LIPID PANEL
Cholesterol: 247 mg/dL — ABNORMAL HIGH (ref 0–200)
HDL: 56 mg/dL (ref >39)
LDL Cholesterol: 151 mg/dL — ABNORMAL HIGH (ref 0–99)
Total CHOL/HDL Ratio: 4.4 RATIO
Triglycerides: 198 mg/dL — ABNORMAL HIGH (ref <150)
VLDL: 40 mg/dL (ref 0–40)

## 2014-09-04 LAB — ANTITHROMBIN III: AntiThromb III Func: 98 % (ref 75–120)

## 2014-09-04 LAB — TSH: TSH: 1.524 u[IU]/mL (ref 0.350–4.500)

## 2014-09-04 LAB — LITHIUM LEVEL: Lithium Lvl: 0.52 mmol/L — ABNORMAL LOW (ref 0.80–1.40)

## 2014-09-04 MED ORDER — METOPROLOL SUCCINATE ER 25 MG PO TB24
25.0000 mg | ORAL_TABLET | Freq: Every day | ORAL | Status: DC
  Administered 2014-09-04 – 2014-09-05 (×2): 25 mg via ORAL
  Filled 2014-09-04 (×2): qty 1

## 2014-09-04 MED ORDER — INSULIN ASPART 100 UNIT/ML ~~LOC~~ SOLN
0.0000 [IU] | Freq: Every day | SUBCUTANEOUS | Status: DC
  Administered 2014-09-04: 2 [IU] via SUBCUTANEOUS

## 2014-09-04 MED ORDER — ASPIRIN 325 MG PO TABS
325.0000 mg | ORAL_TABLET | Freq: Every day | ORAL | Status: DC
  Administered 2014-09-04 – 2014-09-05 (×2): 325 mg via ORAL
  Filled 2014-09-04 (×2): qty 1

## 2014-09-04 MED ORDER — HEPARIN SODIUM (PORCINE) 5000 UNIT/ML IJ SOLN
5000.0000 [IU] | Freq: Three times a day (TID) | INTRAMUSCULAR | Status: DC
  Administered 2014-09-04 – 2014-09-05 (×4): 5000 [IU] via SUBCUTANEOUS
  Filled 2014-09-04 (×4): qty 1

## 2014-09-04 MED ORDER — LITHIUM CARBONATE 150 MG PO CAPS
300.0000 mg | ORAL_CAPSULE | Freq: Two times a day (BID) | ORAL | Status: DC
  Administered 2014-09-04 – 2014-09-05 (×4): 300 mg via ORAL
  Filled 2014-09-04 (×4): qty 2

## 2014-09-04 MED ORDER — HYDROCODONE-ACETAMINOPHEN 5-325 MG PO TABS
1.0000 | ORAL_TABLET | Freq: Four times a day (QID) | ORAL | Status: DC | PRN
  Administered 2014-09-04: 1 via ORAL
  Filled 2014-09-04: qty 1

## 2014-09-04 MED ORDER — STROKE: EARLY STAGES OF RECOVERY BOOK
Freq: Once | Status: AC
  Administered 2014-09-04: 10:00:00
  Filled 2014-09-04: qty 1

## 2014-09-04 MED ORDER — INSULIN ASPART 100 UNIT/ML ~~LOC~~ SOLN
0.0000 [IU] | Freq: Three times a day (TID) | SUBCUTANEOUS | Status: DC
  Administered 2014-09-04: 11 [IU] via SUBCUTANEOUS
  Administered 2014-09-04: 4 [IU] via SUBCUTANEOUS
  Administered 2014-09-05: 3 [IU] via SUBCUTANEOUS

## 2014-09-04 MED ORDER — CLONIDINE HCL 0.2 MG PO TABS
0.2000 mg | ORAL_TABLET | Freq: Every day | ORAL | Status: DC
  Administered 2014-09-04 – 2014-09-05 (×2): 0.2 mg via ORAL
  Filled 2014-09-04 (×2): qty 1

## 2014-09-04 MED ORDER — INSULIN DETEMIR 100 UNIT/ML ~~LOC~~ SOLN
70.0000 [IU] | Freq: Every day | SUBCUTANEOUS | Status: DC
  Administered 2014-09-04: 70 [IU] via SUBCUTANEOUS
  Filled 2014-09-04 (×3): qty 0.7

## 2014-09-04 MED ORDER — INSULIN ASPART 100 UNIT/ML ~~LOC~~ SOLN
0.0000 [IU] | SUBCUTANEOUS | Status: DC
  Administered 2014-09-04 (×2): 5 [IU] via SUBCUTANEOUS

## 2014-09-04 MED ORDER — INSULIN ASPART 100 UNIT/ML ~~LOC~~ SOLN: 0.0000 [IU] | Freq: Three times a day (TID) | SUBCUTANEOUS | Status: DC

## 2014-09-04 MED ORDER — FLUOXETINE HCL 10 MG PO CAPS
10.0000 mg | ORAL_CAPSULE | Freq: Every day | ORAL | Status: DC
  Administered 2014-09-04 – 2014-09-05 (×2): 10 mg via ORAL
  Filled 2014-09-04 (×4): qty 1

## 2014-09-04 MED ORDER — ATORVASTATIN CALCIUM 40 MG PO TABS
40.0000 mg | ORAL_TABLET | Freq: Every day | ORAL | Status: DC
  Administered 2014-09-04: 40 mg via ORAL
  Filled 2014-09-04: qty 1

## 2014-09-04 MED ORDER — PNEUMOCOCCAL VAC POLYVALENT 25 MCG/0.5ML IJ INJ
0.5000 mL | INJECTION | INTRAMUSCULAR | Status: AC
  Administered 2014-09-05: 0.5 mL via INTRAMUSCULAR
  Filled 2014-09-04: qty 0.5

## 2014-09-04 MED ORDER — ACETAMINOPHEN 325 MG PO TABS
650.0000 mg | ORAL_TABLET | Freq: Four times a day (QID) | ORAL | Status: DC | PRN
  Administered 2014-09-04: 650 mg via ORAL
  Filled 2014-09-04: qty 2

## 2014-09-04 MED ORDER — PRAVASTATIN SODIUM 40 MG PO TABS: 40.0000 mg | ORAL_TABLET | Freq: Every day | ORAL | Status: DC

## 2014-09-04 MED ORDER — TRAZODONE HCL 50 MG PO TABS
100.0000 mg | ORAL_TABLET | Freq: Every day | ORAL | Status: DC
  Administered 2014-09-04: 100 mg via ORAL
  Filled 2014-09-04: qty 2

## 2014-09-04 MED ORDER — TIOTROPIUM BROMIDE MONOHYDRATE 18 MCG IN CAPS
18.0000 ug | ORAL_CAPSULE | Freq: Every day | RESPIRATORY_TRACT | Status: DC
  Administered 2014-09-05: 18 ug via RESPIRATORY_TRACT
  Filled 2014-09-04: qty 5

## 2014-09-04 MED ORDER — ALBUTEROL SULFATE (2.5 MG/3ML) 0.083% IN NEBU: 3.0000 mL | INHALATION_SOLUTION | Freq: Four times a day (QID) | RESPIRATORY_TRACT | Status: DC | PRN

## 2014-09-04 MED ORDER — METFORMIN HCL ER 750 MG PO TB24: 2000.0000 mg | ORAL_TABLET | Freq: Every evening | ORAL | Status: DC

## 2014-09-04 NOTE — Progress Notes (Signed)
The patient is a 44 year old woman with diabetes mellitus-diabetic nephropathy and retinopathy, hypertension, and COPD, who was admitted this morning for right sided paresthesias/numbness. She was briefly seen and examined. Her chart, vital signs, laboratory studies were reviewed. Agree with initial workup, with additions below.  -MRI of the patient's brain reveal acute lacunar infarct of the left thalamus. -Carotid ultrasound reveals no ICA stenosis. -Fasting lipid profile reveals a total cholesterol of 247, triglycerides 198, HDL of 56, and LDL of 151. Patient is on pravastatin, will change to Lipitor. -Hypercoagulable panel and TSH are pending. -2-D echocardiogram pending. -We'll order a vitamin B12 level to rule out deficiency. -Metformin is being held due to creatinine of greater than 1.4. -Sliding-scale NovoLog adjusted. -We'll order OT and PT evaluations.

## 2014-09-04 NOTE — ED Provider Notes (Signed)
CSN: [id number]     Arrival date & time 09/03/14  2115 History   First MD Initiated Contact with Patient 09/03/14 2149     Chief Complaint  Patient presents with  . Numbness     (Consider location/radiation/quality/duration/timing/severity/associated sxs/prior Treatment) The history is provided by the patient.   Samantha Pierce is a 44 y.o. female with a history of HTN, insulin dependent diabetes, newly diagnosed COPD and chronic kidney disease presenting with a 5 day history of right arm, hand and  right perioral and facial numbness and weakness. She describes slowly progressive symptoms which started Saturday morning, 5 days ago.  She was seen by her pcp on Monday who endorsed probable stroke development and was placed on aspirin therapy at that time.  She denies dizziness, nausea, vomiting, dysarthria, visual changes.  She has had a headache with elevated blood pressure since she ran out of her lisinopril/hctz 3 days ago, but endorses taking her other medications including her clonidine and metoprolol.      Past Medical History  Diagnosis Date  . Hypertension   . Blood transfusion   . Anxiety   . Depression   . Diabetes mellitus   . Anemia   . Chronic kidney disease protein high in urine     fo;;owed by pmp  . COPD (chronic obstructive pulmonary disease)    Past Surgical History  Procedure Laterality Date  . Dilation and curettage of uterus    . Cesarean section      x2  . Tubal ligation    . Pars plana vitrectomy  04/20/2011    Procedure: PARS PLANA VITRECTOMY WITH 25 GAUGE;  Surgeon: Hayden Pedro, MD;  Location: [place];  Service: Ophthalmology;  Laterality: Left;  membrane peel, gas fluid exchange, endolaser, repair of complex retinal detachment  . Eye surgery  right eye pars plano vitrectomy .   [facility]     Family History  Problem Relation Age of Onset  . Diabetes Other    History  Substance Use Topics  . Smoking status: Former Smoker -- 0.03 packs/day for 30  years    Types: Cigarettes    Quit date: 04/04/2014  . Smokeless tobacco: Never Used  . Alcohol Use: No   OB History    Gravida Para Term Preterm AB TAB SAB Ectopic Multiple Living   2 2 2       2      Review of Systems  Constitutional: Negative for fever and chills.  HENT: Negative for congestion and sore throat.   Eyes: Negative.  Negative for visual disturbance.  Respiratory: Negative for chest tightness and shortness of breath.   Cardiovascular: Negative for chest pain.  Gastrointestinal: Negative for nausea and abdominal pain.  Genitourinary: Negative.   Musculoskeletal: Negative for joint swelling, arthralgias and neck pain.  Skin: Negative.  Negative for rash and wound.  Neurological: Positive for weakness, numbness and headaches. Negative for dizziness and light-headedness.  Psychiatric/Behavioral: Negative.       Allergies  Penicillins  Home Medications   Prior to Admission medications   Medication Sig Start Date End Date Taking? Authorizing Provider  clindamycin (CLEOCIN) 300 MG capsule Take 300 mg by mouth 4 (four) times daily.   Yes Historical Provider, MD  cloNIDine (CATAPRES) 0.2 MG tablet Take 0.2 mg by mouth daily.   Yes Historical Provider, MD  FLUoxetine (PROZAC) 10 MG capsule Take 10 mg by mouth daily.   Yes Historical Provider, MD  insulin aspart (NOVOLOG) 100  UNIT/ML FlexPen Inject 0-18 Units into the skin 3 (three) times daily with meals.   Yes Historical Provider, MD  Insulin Detemir (LEVEMIR) 100 UNIT/ML Pen Inject 70 Units into the skin at bedtime.   Yes Historical Provider, MD  lisinopril-hydrochlorothiazide (PRINZIDE,ZESTORETIC) 20-12.5 MG per tablet Take 1 tablet by mouth daily.   Yes Historical Provider, MD  lithium 300 MG tablet Take 300 mg by mouth 2 (two) times daily.    Yes Historical Provider, MD  metformin (FORTAMET) 1000 MG (OSM) 24 hr tablet Take 2,000 mg by mouth every evening.    Yes Historical Provider, MD  metoprolol succinate  (TOPROL-XL) 25 MG 24 hr tablet Take 25 mg by mouth daily.   Yes Historical Provider, MD  pravastatin (PRAVACHOL) 40 MG tablet Take 40 mg by mouth at bedtime.     Yes Historical Provider, MD  tiotropium (SPIRIVA) 18 MCG inhalation capsule Place 18 mcg into inhaler and inhale daily.   Yes Historical Provider, MD  traZODone (DESYREL) 100 MG tablet Take 100 mg by mouth at bedtime.   Yes Historical Provider, MD  albuterol (PROVENTIL HFA;VENTOLIN HFA) 108 (90 BASE) MCG/ACT inhaler Inhale 1-2 puffs into the lungs every 6 (six) hours as needed for wheezing or shortness of breath. 06/20/14   Nat Christen, MD  azithromycin (ZITHROMAX Z-PAK) 250 MG tablet 2 po day one, then 1 daily x 4 days Patient not taking: Reported on 09/03/2014 06/20/14   Nat Christen, MD   BP 162/83 mmHg  Pulse 65  Temp(Src) 98.3 F (36.8 C) (Oral)  Resp 20  Ht 5\' 4"  (1.626 m)  Wt 270 lb (122.471 kg)  BMI 46.32 kg/m2  SpO2 98%  LMP 08/27/2014 Physical Exam  Constitutional: She is oriented to person, place, and time. She appears well-developed and well-nourished.  Obese, pleasant female in no distress.  HENT:  Head: Normocephalic and atraumatic.  Eyes: Conjunctivae and EOM are normal. Pupils are equal, round, and reactive to light.  Neck: Normal range of motion.  Cardiovascular: Normal rate, regular rhythm, normal heart sounds and intact distal pulses.   Pulmonary/Chest: Effort normal and breath sounds normal. She has no wheezes.  Abdominal: Soft. Bowel sounds are normal. There is no tenderness.  Musculoskeletal: Normal range of motion.  Neurological: She is alert and oriented to person, place, and time. A cranial nerve deficit and sensory deficit is present. GCS eye subscore is 4. GCS verbal subscore is 5. GCS motor subscore is 6. She displays no Babinski's sign on the right side. She displays no Babinski's sign on the left side.  Right perioral droop with numbness. Forehead and eyelids appear unaffected. Right grip strength 4/5,  left 5/5.  Decreased sensation to fine touch forearm and hand.  Difficulty with rapid alternating coordination of right hand. No pronator drift. Lower extremities full strength, sensation intact.  Skin: Skin is warm and dry.  Psychiatric: She has a normal mood and affect.  Nursing note and vitals reviewed.   ED Course  Procedures (including critical care time) Labs Review Labs Reviewed  CBC WITH DIFFERENTIAL/PLATELET - Abnormal; Notable for the following:    MCH 25.5 (*)    All other components within normal limits  BASIC METABOLIC PANEL - Abnormal; Notable for the following:    Glucose, Bld 213 (*)    Creatinine, Ser 1.52 (*)    Calcium 8.0 (*)    GFR calc non Af Amer 41 (*)    GFR calc Af Amer 47 (*)    All other components  within normal limits  URINALYSIS, ROUTINE W REFLEX MICROSCOPIC - Abnormal; Notable for the following:    Glucose, UA 100 (*)    Hgb urine dipstick MODERATE (*)    Protein, ur >300 (*)    All other components within normal limits  URINE MICROSCOPIC-ADD ON - Abnormal; Notable for the following:    Squamous Epithelial / LPF MANY (*)    Bacteria, UA MANY (*)    All other components within normal limits  TROPONIN I  PROTIME-INR  APTT  POC URINE PREG, ED    Imaging Review Ct Head Wo Contrast  09/04/2014   CLINICAL DATA:  Right arm numbness, mouth numbness, symptoms for 5 days.  EXAM: CT HEAD WITHOUT CONTRAST  TECHNIQUE: Contiguous axial images were obtained from the base of the skull through the vertex without intravenous contrast.  COMPARISON:  None.  FINDINGS: Age-indeterminate small lacunar infarct in the left basal ganglia. No intracranial hemorrhage, mass effect, or midline shift. No hydrocephalus. The basilar cisterns are patent. No evidence of territorial infarct. No intracranial fluid collection. Calvarium is intact. Frontal sinuses are hypo pneumatized. The mastoid air cells are well aerated.  IMPRESSION: Age indeterminate tiny lacunar infarct in the left  basal ganglia.   Electronically Signed   By: Jeb Levering M.D.   On: 09/04/2014 00:19     EKG Interpretation   Date/Time:  Thursday September 03 2014 22:48:14 EDT Ventricular Rate:  66 PR Interval:  177 QRS Duration: 79 QT Interval:  427 QTC Calculation: 447 R Axis:   4 Text Interpretation:  Sinus rhythm Confirmed by ZAMMIT  MD, JOSEPH [phone])  on 09/03/2014 10:58:08 PM      MDM   Final diagnoses:  Stroke  Renal insufficiency  Hyperglycemia  Essential hypertension    Patients labs and/or radiological studies were reviewed and considered during the medical decision making and disposition process.  Results were also discussed with patient. Pt also seen by Dr. Tomi Bamberger during this visit.  Pt will benefit by inpatient workup of her stroke including MRI, echo, carotid dopplers.  We feel this patient will have difficulty obtaining these studies and followup care as outpatient.    Discussed with Dr. Marin Comment of [facility] group, asked for admission.  He agrees to see pt in ed and will determine need for inpatient vs outpatient workup.    Evalee Jefferson, PA-C 09/04/14 [id number]  Rolland Porter, MD 09/04/14 [phone]

## 2014-09-04 NOTE — Progress Notes (Signed)
UR completed 

## 2014-09-04 NOTE — Progress Notes (Signed)
PHARMACIST - PHYSICIAN COMMUNICATION DR:  Marin Comment CONCERNING:  METFORMIN SAFE ADMINISTRATION POLICY  RECOMMENDATION: Metformin has been placed on DISCONTINUE (rejected order) STATUS and should be reordered only after any of the conditions below are ruled out.  Current safety recommendations include avoiding metformin for a minimum of 48 hours after the patient's exposure to intravenous contrast media.  DESCRIPTION:  The Pharmacy Committee has adopted a policy that restricts the use of metformin in hospitalized patients until all the contraindications to administration have been ruled out. Specific contraindications are: []  Serum creatinine ? 1.5 for males [x]  Serum creatinine ? 1.4 for females []  Shock, acute MI, sepsis, hypoxemia, dehydration []  Planned administration of intravenous iodinated contrast media []  Heart Failure patients with low EF []  Acute or chronic metabolic acidosis (including DKA)

## 2014-09-04 NOTE — Progress Notes (Signed)
  Echocardiogram 2D Echocardiogram has been performed.  Samantha Pierce 09/04/2014, 12:49 PM

## 2014-09-04 NOTE — Evaluation (Signed)
Physical Therapy Evaluation Patient Details Name: Samantha Pierce MRN: [id number] DOB: March 22, 1971 Today's Date: 09/04/2014   History of Present Illness  Samantha Pierce is an 44 y.o. female with hx of HTN, DM, depression, hx of anemia, COPD, presented to the ER as her facial numbness and right arm weakness persisted since 5 days ago. She had her symptoms five days ago, [facility] PCP [facility], and was told she may have had a small CVA, and to begin taking an ASA per day. She presented to the ER as her symptoms did not resolve. She has mild HA, but no visual problems, nausea, vomiting, or any other symptomology. Work up in the ER included a head CT which showed a tiny basal ganglia CVA of undetermined age. She has an EKG which showed NSR, and her serology was unremarkable with BS of 200's and Cr of 1.5. MRI has revealed an acute lacunar infarct of the left thalamus.  Pt lives with her children and is currently unemployed but is independent with all ADLs.  Clinical Impression  Pt was seen for evaluation.  She continues to c/o numbness on the right face and hand.  Strength is WNL as is balance.  She is independent in all transfers and gait with no assistive device.  No further PT should be needed.      Follow Up Recommendations No PT follow up    Equipment Recommendations  None recommended by PT    Recommendations for Other Services       Precautions / Restrictions Precautions Precautions: None Restrictions Weight Bearing Restrictions: No      Mobility  Bed Mobility Overal bed mobility: Independent                Transfers Overall transfer level: Independent                  Ambulation/Gait Ambulation/Gait assistance: Independent Ambulation Distance (Feet): 200 Feet Assistive device: None Gait Pattern/deviations: WFL(Within Functional Limits)   Gait velocity interpretation: at or above normal speed for age/gender    Stairs             Wheelchair Mobility    Modified Rankin (Stroke Patients Only)       Balance Overall balance assessment: Independent                                           Pertinent Vitals/Pain Pain Assessment: No/denies pain    Home Living Family/patient expects to be discharged to:: Private residence Living Arrangements: Children Available Help at Discharge: Family;Available PRN/intermittently Type of Home: Apartment Home Access: Level entry     Home Layout: One level Home Equipment: None      Prior Function Level of Independence: Independent               Hand Dominance   Dominant Hand: Left    Extremity/Trunk Assessment   Upper Extremity Assessment: Overall WFL for tasks assessed (decreased coordination due to numbness in left hand)           Lower Extremity Assessment: Overall WFL for tasks assessed         Communication   Communication: No difficulties  Cognition Arousal/Alertness: Awake/alert Behavior During Therapy: WFL for tasks assessed/performed Overall Cognitive Status: Within Functional Limits for tasks assessed  General Comments      Exercises        Assessment/Plan    PT Assessment Patent does not need any further PT services  PT Diagnosis     PT Problem List    PT Treatment Interventions     PT Goals (Current goals can be found in the Care Plan section) Acute Rehab PT Goals PT Goal Formulation: All assessment and education complete, DC therapy    Frequency     Barriers to discharge  none      Co-evaluation               End of Session Equipment Utilized During Treatment: Gait belt Activity Tolerance: Patient tolerated treatment well Patient left: in bed;with call bell/phone within reach           Time: [id number] PT Time Calculation (min) (ACUTE ONLY): 22 min   Charges:   PT Evaluation $Initial PT Evaluation Tier I: 1 Procedure     PT G CodesDemetrios Isaacs L 09/04/2014, 1:52 PM

## 2014-09-04 NOTE — Care Management Note (Signed)
    Page 1 of 1   09/04/2014     11:59:35 AM CARE MANAGEMENT NOTE 09/04/2014  Patient:  Samantha Pierce, Samantha Pierce   Account Number:  [id number]  Date Initiated:  09/04/2014  Documentation initiated by:  Jolene Provost  Subjective/Objective Assessment:   Pt admitted for CVA work up. Pt is from home. Pt recieves care from health depatment. Plan for discharge home with self care. Will need f/u with health department. No CM needs.     Action/Plan:   Anticipated DC Date:  09/04/2014   Anticipated DC Plan:  [place]  CM consult      Choice offered to / List presented to:             Status of service:  Completed, signed off Medicare Important Message given?   (If response is "NO", the following Medicare IM given date fields will be blank) Date Medicare IM given:   Medicare IM given by:   Date Additional Medicare IM given:   Additional Medicare IM given by:    Discharge Disposition:  HOME/SELF CARE  Per UR Regulation:    If discussed at Long Length of Stay Meetings, dates discussed:    Comments:  09/04/2014 [place], RN, MSN, CM

## 2014-09-04 NOTE — ED Provider Notes (Signed)
This chart was scribed for Rolland Porter, MD by Delphia Grates, ED Scribe. This patient was seen in room APA14/APA14 and the patient's care was started at 12:35 AM.   HPI Comments: Samantha Pierce is a 44 y.o. female who presents to the Emergency Department complaining of numbness to the right side of mouth and right hand for the past 5-6 days. She states her symptoms began during the day. Patient reports difficulty speaking. Patient states she is dropping objects with her right hand and notes difficulty putting on earring and checking her blood glucose. She denies abnormal gait. Patient is left hand dominant.   PHYSICAL EXAM: Neuro: right grip strength is very weak. No pronator drift. Light sensation of face stronger on left than the right.    COORDINATION OF CARE: At [place] Discussed treatment plan with patient which includes possible admission after CT scan revealed positive findings for a stroke. Patient agrees.    Medical screening examination/treatment/procedure(s) were conducted as a shared visit with non-physician practitioner(s) and myself.  I personally evaluated the patient during the encounter.   EKG Interpretation   Date/Time:  Thursday September 03 2014 22:48:14 EDT Ventricular Rate:  66 PR Interval:  177 QRS Duration: 79 QT Interval:  427 QTC Calculation: 447 R Axis:   4 Text Interpretation:  Sinus rhythm Confirmed by ZAMMIT  MD, Broadus John [phone])  on 09/03/2014 10:58:08 PM      Rolland Porter, MD, Barbette Or, MD 09/04/14 0104

## 2014-09-04 NOTE — ED Notes (Signed)
Patient sleeping when entering room. Patient will respond to verbal conversation and questions.

## 2014-09-04 NOTE — H&P (Signed)
[facility] History and Physical  Samantha Pierce [id number] DOB: 1970-10-16    PCP:   Yevette Edwards, NP   Chief Complaint: Stroke 5 days ago.  HPI: Samantha Pierce is an 44 y.o. female with hx of HTN, DM, depression, hx of anemia, COPD, presented to the ER as her facial numbness and right arm weakness persisted since 5 days ago.  She had her symptoms five days ago, [facility], and was told she may have had a small CVA, and to begin taking an ASA per day.  She presented to the ER as her symptoms did not resolve.  She has mild HA, but no visual problems, nausea, vomiting, or any other symptomology.  Work up in the ER included a head CT which showed a tiny basal ganglia CVA of undetermined age.  She has an EKG which showed NSR, and her serology was unremarkable with BS of 200's and Cr of 1.5.  Hospitalist was asked to admit her as outpatient work up may be suboptimal for her.  Rewiew of Systems:  Constitutional: Negative for malaise, fever and chills. No significant weight loss or weight gain Eyes: Negative for eye pain, redness and discharge, diplopia, visual changes, or flashes of light. ENMT: Negative for ear pain, hoarseness, nasal congestion, sinus pressure and sore throat. No headaches; tinnitus, drooling, or problem swallowing. Cardiovascular: Negative for chest pain, palpitations, diaphoresis, dyspnea and peripheral edema. ; No orthopnea, PND Respiratory: Negative for cough, hemoptysis, wheezing and stridor. No pleuritic chestpain. Gastrointestinal: Negative for nausea, vomiting, diarrhea, constipation, abdominal pain, melena, blood in stool, hematemesis, jaundice and rectal bleeding.    Genitourinary: Negative for frequency, dysuria, incontinence,flank pain and hematuria; Musculoskeletal: Negative for back pain and neck pain. Negative for swelling and trauma.;  Skin: . Negative for pruritus, rash, abrasions, bruising and skin lesion.;  ulcerations Neuro: Negative for , lightheadedness and neck stiffness. Negative for weakness, altered level of consciousness , altered mental status, extremity weakness, burning feet, involuntary movement, seizure and syncope.  Psych: negative for anxiety, depression, insomnia, tearfulness, panic attacks, hallucinations, paranoia, suicidal or homicidal ideation    Past Medical History  Diagnosis Date  . Hypertension   . Blood transfusion   . Anxiety   . Depression   . Diabetes mellitus   . Anemia   . Chronic kidney disease protein high in urine     fo;;owed by pmp  . COPD (chronic obstructive pulmonary disease)     Past Surgical History  Procedure Laterality Date  . Dilation and curettage of uterus    . Cesarean section      x2  . Tubal ligation    . Pars plana vitrectomy  04/20/2011    Procedure: PARS PLANA VITRECTOMY WITH 25 GAUGE;  Surgeon: Hayden Pedro, MD;  Location: [place];  Service: Ophthalmology;  Laterality: Left;  membrane peel, gas fluid exchange, endolaser, repair of complex retinal detachment  . Eye surgery  right eye pars plano vitrectomy .   [facility]      Medications:  HOME MEDS: Prior to Admission medications   Medication Sig Start Date End Date Taking? Authorizing Provider  clindamycin (CLEOCIN) 300 MG capsule Take 300 mg by mouth 4 (four) times daily.   Yes Historical Provider, MD  cloNIDine (CATAPRES) 0.2 MG tablet Take 0.2 mg by mouth daily.   Yes Historical Provider, MD  FLUoxetine (PROZAC) 10 MG capsule Take 10 mg by mouth daily.   Yes Historical Provider, MD  insulin aspart (NOVOLOG) 100 UNIT/ML FlexPen Inject 0-18 Units into the skin 3 (three) times daily with meals.   Yes Historical Provider, MD  Insulin Detemir (LEVEMIR) 100 UNIT/ML Pen Inject 70 Units into the skin at bedtime.   Yes Historical Provider, MD  lisinopril-hydrochlorothiazide (PRINZIDE,ZESTORETIC) 20-12.5 MG per tablet Take 1 tablet by mouth daily.   Yes Historical Provider, MD  lithium  300 MG tablet Take 300 mg by mouth 2 (two) times daily.    Yes Historical Provider, MD  metformin (FORTAMET) 1000 MG (OSM) 24 hr tablet Take 2,000 mg by mouth every evening.    Yes Historical Provider, MD  metoprolol succinate (TOPROL-XL) 25 MG 24 hr tablet Take 25 mg by mouth daily.   Yes Historical Provider, MD  pravastatin (PRAVACHOL) 40 MG tablet Take 40 mg by mouth at bedtime.     Yes Historical Provider, MD  tiotropium (SPIRIVA) 18 MCG inhalation capsule Place 18 mcg into inhaler and inhale daily.   Yes Historical Provider, MD  traZODone (DESYREL) 100 MG tablet Take 100 mg by mouth at bedtime.   Yes Historical Provider, MD  albuterol (PROVENTIL HFA;VENTOLIN HFA) 108 (90 BASE) MCG/ACT inhaler Inhale 1-2 puffs into the lungs every 6 (six) hours as needed for wheezing or shortness of breath. 06/20/14   Nat Christen, MD  azithromycin (ZITHROMAX Z-PAK) 250 MG tablet 2 po day one, then 1 daily x 4 days Patient not taking: Reported on 09/03/2014 06/20/14   Nat Christen, MD     Allergies:  Allergies  Allergen Reactions  . Penicillins Rash    Social History:   reports that she quit smoking about 5 months ago. Her smoking use included Cigarettes. She has a .9 pack-year smoking history. She has never used smokeless tobacco. She reports that she does not drink alcohol or use illicit drugs.  Family History: Family History  Problem Relation Age of Onset  . Diabetes Other      Physical Exam: Filed Vitals:   09/03/14 2121 09/03/14 2300 09/04/14 0018  BP: 197/93  162/83  Pulse: 86  65  Temp: 99.2 F (37.3 C) 98.3 F (36.8 C) 98.3 F (36.8 C)  TempSrc: Oral  Oral  Resp: 18  20  Height: 5\' 4"  (1.626 m)    Weight: 122.471 kg (270 lb)    SpO2: 99%  98%   Blood pressure 162/83, pulse 65, temperature 98.3 F (36.8 C), temperature source Oral, resp. rate 20, height 5\' 4"  (1.626 m), weight 122.471 kg (270 lb), last menstrual period 08/27/2014, SpO2 98 %.  GEN:  Pleasant  patient lying in the  stretcher in no acute distress; cooperative with exam. PSYCH:  alert and oriented x4; does not appear anxious or depressed; affect is appropriate. HEENT: Mucous membranes pink and anicteric; PERRLA; EOM intact; no cervical lymphadenopathy nor thyromegaly or carotid bruit; no JVD; There were no stridor. Neck is very supple. Breasts:: Not examined CHEST WALL: No tenderness CHEST: Normal respiration, clear to auscultation bilaterally.  HEART: Regular rate and rhythm.  There are no murmur, rub, or gallops.   BACK: No kyphosis or scoliosis; no CVA tenderness ABDOMEN: soft and non-tender; no masses, no organomegaly, normal abdominal bowel sounds; no pannus; no intertriginous candida. There is no rebound and no distention. Rectal Exam: Not done EXTREMITIES: No bone or joint deformity; age-appropriate arthropathy of the hands and knees; no edema; no ulcerations.  There is no calf tenderness. Genitalia: not examined PULSES: 2+ and symmetric SKIN: Normal hydration no rash or ulceration CNS:  Cranial nerves 2-12 grossly intact no focal lateralizing neurologic deficit.  Speech is fluent; uvula elevated with phonation, facial symmetry and tongue midline. DTR are normal bilaterally, cerebella exam is intact, barbinski is negative and slightly weaker on the right hand grasp. No sensory loss.   Labs on Admission:  Basic Metabolic Panel:  Recent Labs Lab 09/03/14 2255  NA 137  K 3.8  CL 106  CO2 25  GLUCOSE 213*  BUN 19  CREATININE 1.52*  CALCIUM 8.0*     Recent Labs Lab 09/03/14 2255  WBC 10.0  NEUTROABS 6.2  HGB 12.3  HCT 37.9  MCV 78.5  PLT 305   Cardiac Enzymes:  Recent Labs Lab 09/03/14 2255  TROPONINI <0.03    Radiological Exams on Admission: Ct Head Wo Contrast  09/04/2014   CLINICAL DATA:  Right arm numbness, mouth numbness, symptoms for 5 days.  EXAM: CT HEAD WITHOUT CONTRAST  TECHNIQUE: Contiguous axial images were obtained from the base of the skull through the vertex  without intravenous contrast.  COMPARISON:  None.  FINDINGS: Age-indeterminate small lacunar infarct in the left basal ganglia. No intracranial hemorrhage, mass effect, or midline shift. No hydrocephalus. The basilar cisterns are patent. No evidence of territorial infarct. No intracranial fluid collection. Calvarium is intact. Frontal sinuses are hypo pneumatized. The mastoid air cells are well aerated.  IMPRESSION: Age indeterminate tiny lacunar infarct in the left basal ganglia.   Electronically Signed   By: Jeb Levering M.D.   On: 09/04/2014 00:19    EKG: Independently reviewed.    Assessment/Plan Present on Admission:  . CVA (cerebral vascular accident) . HTN (hypertension) . CVA (cerebral infarction)  PLAN:  I suspect she her small basal ganglia 5 days ago was due to small vessel disease, and as such, she should just continue her ASA given daily.  Will go ahead and obtain stroke work up, though I think they will be negative.  It will include MRI, ECHO and carotid US.  She is to keep her BP under good control, and keep her DM under good control as well.  She is stable, full code, and will be admitted to [facility] service.  I will obtain thrombophilic work up as well.  Thank you for allowing me to participate in her care.   Other plans as per orders.  Code Status: FULL Haskel Khan, MD. [facility] Pager [phone] 7pm to 7am.  09/04/2014, 1:51 AM

## 2014-09-05 ENCOUNTER — Encounter (HOSPITAL_COMMUNITY): Payer: Self-pay | Admitting: Internal Medicine

## 2014-09-05 DIAGNOSIS — E08319 Diabetes mellitus due to underlying condition with unspecified diabetic retinopathy without macular edema: Secondary | ICD-10-CM

## 2014-09-05 DIAGNOSIS — I5189 Other ill-defined heart diseases: Secondary | ICD-10-CM | POA: Diagnosis present

## 2014-09-05 DIAGNOSIS — E11359 Type 2 diabetes mellitus with proliferative diabetic retinopathy without macular edema: Secondary | ICD-10-CM

## 2014-09-05 DIAGNOSIS — N182 Chronic kidney disease, stage 2 (mild): Secondary | ICD-10-CM

## 2014-09-05 LAB — BASIC METABOLIC PANEL
Anion gap: 5 (ref 5–15)
BUN: 18 mg/dL (ref 6–23)
CO2: 28 mmol/L (ref 19–32)
Calcium: 8.7 mg/dL (ref 8.4–10.5)
Chloride: 107 mmol/L (ref 96–112)
Creatinine, Ser: 1.13 mg/dL — ABNORMAL HIGH (ref 0.50–1.10)
GFR calc Af Amer: 67 mL/min — ABNORMAL LOW (ref >90)
GFR calc non Af Amer: 58 mL/min — ABNORMAL LOW (ref >90)
Glucose, Bld: 84 mg/dL (ref 70–99)
Potassium: 3.8 mmol/L (ref 3.5–5.1)
Sodium: 140 mmol/L (ref 135–145)

## 2014-09-05 LAB — GLUCOSE, CAPILLARY
Glucose-Capillary: 87 mg/dL (ref 70–99)
Glucose-Capillary: 128 mg/dL — ABNORMAL HIGH (ref 70–99)

## 2014-09-05 LAB — CBC
HCT: 41.2 % (ref 36.0–46.0)
Hemoglobin: 13.4 g/dL (ref 12.0–15.0)
MCH: 25.9 pg — ABNORMAL LOW (ref 26.0–34.0)
MCHC: 32.5 g/dL (ref 30.0–36.0)
MCV: 79.5 fL (ref 78.0–100.0)
Platelets: 339 10*3/uL (ref 150–400)
RBC: 5.18 MIL/uL — ABNORMAL HIGH (ref 3.87–5.11)
RDW: 13 % (ref 11.5–15.5)
WBC: 6.4 10*3/uL (ref 4.0–10.5)

## 2014-09-05 LAB — HEMOGLOBIN A1C
Hgb A1c MFr Bld: 10 % — ABNORMAL HIGH (ref 4.8–5.6)
Mean Plasma Glucose: 240 mg/dL

## 2014-09-05 LAB — VITAMIN B12: Vitamin B-12: 195 pg/mL — ABNORMAL LOW (ref 211–911)

## 2014-09-05 MED ORDER — ATORVASTATIN CALCIUM 40 MG PO TABS: 40.0000 mg | ORAL_TABLET | Freq: Every day | ORAL | Status: DC

## 2014-09-05 MED ORDER — ASPIRIN 325 MG PO TABS: 325.0000 mg | ORAL_TABLET | Freq: Every day | ORAL | Status: DC

## 2014-09-05 MED ORDER — GABAPENTIN 300 MG PO CAPS: 300.0000 mg | ORAL_CAPSULE | Freq: Every evening | ORAL | Status: DC | PRN

## 2014-09-05 MED ORDER — HYDROCHLOROTHIAZIDE 12.5 MG PO CAPS
12.5000 mg | ORAL_CAPSULE | Freq: Every day | ORAL | Status: DC
  Administered 2014-09-05: 12.5 mg via ORAL
  Filled 2014-09-05: qty 1

## 2014-09-05 MED ORDER — METOPROLOL TARTRATE 25 MG PO TABS
25.0000 mg | ORAL_TABLET | Freq: Once | ORAL | Status: AC
  Administered 2014-09-05: 25 mg via ORAL
  Filled 2014-09-05: qty 1

## 2014-09-05 MED ORDER — LISINOPRIL 10 MG PO TABS
20.0000 mg | ORAL_TABLET | Freq: Every day | ORAL | Status: DC
Start: 2014-09-05 — End: 2014-09-05
  Administered 2014-09-05: 20 mg via ORAL
  Filled 2014-09-05: qty 2

## 2014-09-05 MED ORDER — INSULIN DETEMIR 100 UNIT/ML FLEXPEN: 45.0000 [IU] | PEN_INJECTOR | Freq: Two times a day (BID) | SUBCUTANEOUS | Status: DC

## 2014-09-05 NOTE — Progress Notes (Signed)
Pt's BP 198/93 and she is asymptomatic.  Dr. Marin Comment notified via phone and new orders were received and carried out.  Nursing staff to continue to monitor.

## 2014-09-05 NOTE — Progress Notes (Signed)
Patient with orders to be discharged home. Discharge instructions given, patient verbalized understanding. Patient stable. Patient left in private vehicle with family.  

## 2014-09-05 NOTE — Discharge Summary (Signed)
Physician Discharge Summary  ELLIANNAH EKMAN [id number] DOB: [birth year] DOA: 09/03/2014  PCP: Yevette Edwards, NP  Admit date: 09/03/2014 Discharge date: 09/05/2014  Time spent: Greater than 30 minutes  Recommendations for Outpatient Follow-up:  1. Hypercoagulable panel results and vitamin B12 results pending at the time of discharge-please follow up on the results 2. Recommend referral for outpatient occupational therapy. 3. Recommend reassessment of the patient's hypertension. Consider increasing the dose of Toprol-XL in the next few weeks to optimize blood pressure control. 4. Recommend follow-up of the patient's diabetes as the dose of Levemir was increased.    Discharge Diagnoses:   1. Acute lacunar infarct of the left thalamus. 2. Right-sided paresthesias/numbness secondary to acute stroke. 3. Acute kidney injury superimposed on stage II chronic kidney disease. 4. Essential, malignant hypertension. 5. Type II, insulin-dependent diabetes mellitus with a history of diabetic retinopathy. 6. Diastolic dysfunction. Grade 2 diastolic dysfunction per echo; EF 55-60%. 7. Hyperlipidemia. 8. Obesity.  Discharge Condition: Improved.  Diet recommendation: Carbohydrate/heart healthy.  Filed Weights   09/03/14 2121 09/04/14 0259  Weight: 122.471 kg (270 lb) 125.782 kg (277 lb 4.8 oz)    History of present illness:  The patient is a 44 year old woman with a history of hypertension, diabetes mellitus, depression, and COPD, who presented to the emergency department on 09/03/14 with a chief complaint of facial numbness and right arm weakness which started 5 days prior to her presentation. She reported seeing her PCP and was told that she may have had a small stroke. She was instructed to begin taking an aspirin a day. When her symptoms worsened, she presented to the emergency department and was subsequently admitted on 09/04/2014. In the ED, she was afebrile and hypertensive with a blood  pressure 197/93. Noncontrasted CT scan of her head revealed an age indeterminate tiny lacunar infarct in the left basal ganglia. Her lab data were significant for creatinine of 1.5 to, normal troponin I, glucose of 213, and negative urine pregnancy test. She was admitted for further evaluation and management.   Hospital Course:   1. Acute lacunar thalamic stroke. The patient was started on aspirin after she passed the swallow evaluation. She had not started it prior to the hospitalization. She was continued on her statin. For further evaluation, number of studies were ordered. The hypercoagulable panel and vitamin B12 level were pending at the time of discharge. Her TSH was within normal limits. Her total cholesterol was 247. MRI of her brain confirmed an acute lacunar infarct of the left thalamus. Carotid ultrasound revealed no significant ICA stenosis. 2-D echocardiogram revealed ejection fraction of 55-60% and grade 2 diastolic dysfunction. The occupational and physical therapist were consulted. The occupational therapist recommended outpatient therapy, but the physical therapist felt that the patient did not need further physical therapy. Because of the weekend, the occupational therapy appointment could not be arranged. The patient was instructed to discuss this with her PCP. She voiced understanding. At the time of hospital discharge, the extent of her subjective numbness and tingling has subsided. She was instructed to resume gabapentin as needed.  Malignant hypertension. The patient is treated chronically with multiple medications including clonidine, lisinopril/HCTZ, and recently Toprol-XL. She was hypertensive in the ED. Her blood pressure medications were restarted with exception of lisinopril/HCTZ.  At the time of discharge, her blood pressure was 166/75. Recommend further outpatient management; consider titrating up the Toprol-XL in a few weeks with monitoring of her heart  rate.  Hyperlipidemia. The patient was continued on pravastatin initially.  Her fasting lipid profile revealed a total cholesterol of 247, triglycerides 198, HDL cholesterol of 56, and LDL cholesterol of 151. In light of these findings, pravastatin was discontinued in favor of Lipitor.  Type 2 insulin requiring diabetes mellitus. Metformin was temporarily held because of the patient's elevated creatinine. Lantus and sliding scale NovoLog were started for treatment. Her hemoglobin A1c was found to be 10.0. At the time of discharge, her creatinine had improved. She was instructed to resume metformin and sliding scale Humalog. However, she was instructed to increase the dose of Levemir from 70 units daily at bedtime to 45 units twice a day. Further management will be deferred to her PCP.  Acute kidney injury. In review of the patient's chart, she probably has stage II chronic kidney disease. Her creatinine was 1.5 to time of discharge. She was provided with only gentle IV fluids. At the time of discharge, her creatinine was 1.13.    Procedures:  2-D echocardiogram 09/04/14:    Study Conclusions - Left ventricle: The cavity size was at the upper limits of normal. Systolic function was normal. The estimated ejection fraction was in the range of 55% to 60%. Wall motion was normal; there were no regional wall motion abnormalities. Features are consistent with a pseudonormal left ventricular filling pattern, with concomitant abnormal relaxation and increased filling pressure (grade 2 diastolic dysfunction). Doppler parameters are consistent with high ventricular filling pressure. - Aortic valve: There was mild regurgitation. - Left atrium: The atrium was mildly dilated. Volume/bsa, ES, (1-plane Simpson&'s, A2C): 29.6 ml/m^2. - Atrial septum: No defect or patent foramen ovale was identified.  Consultations:  None  Discharge Exam: Filed Vitals:   09/05/14 0606  BP: 166/75   Pulse: 59  Temp: 97.9 F (36.6 C)  Resp: 20    General: Pleasant obese 44 year old African-American woman sitting up in a chair, in no acute distress. Cardiovascular: S1, S2, with a 2/6 systolic murmur. Respiratory: Clear to auscultation bilaterally. Neurologic: She is alert and oriented 3. Cranial nerves II through XII are intact with exception of mild decreased sensation over the right face and right lateral arm. Gait is within normal limits. Hand grip bilaterally is 5 over 5.  Discharge Instructions   Discharge Instructions    Diet - low sodium heart healthy    Complete by:  As directed      Diet Carb Modified    Complete by:  As directed      Discharge instructions    Complete by:  As directed   No driving for 1 week. Discussed outpatient occupational therapy referral with your doctor. Take medications as prescribed. Levemir was changed to 45 units twice daily.     Increase activity slowly    Complete by:  As directed           Current Discharge Medication List    START taking these medications   Details  aspirin 325 MG tablet Take 1 tablet (325 mg total) by mouth daily.    atorvastatin (LIPITOR) 40 MG tablet Take 1 tablet (40 mg total) by mouth daily at 6 PM. New medication to treat her high cholesterol. Qty: 30 tablet, Refills: 3    gabapentin (NEURONTIN) 300 MG capsule Take 1 capsule (300 mg total) by mouth at bedtime as needed (For numbness and tingling).      CONTINUE these medications which have CHANGED   Details  Insulin Detemir (LEVEMIR) 100 UNIT/ML Pen Inject 45 Units into the skin 2 (two) times daily at 8  am and 10 pm.      CONTINUE these medications which have NOT CHANGED   Details  cloNIDine (CATAPRES) 0.2 MG tablet Take 0.2 mg by mouth daily.    FLUoxetine (PROZAC) 10 MG capsule Take 10 mg by mouth daily.    insulin aspart (NOVOLOG) 100 UNIT/ML FlexPen Inject 0-18 Units into the skin 3 (three) times daily with meals.     lisinopril-hydrochlorothiazide (PRINZIDE,ZESTORETIC) 20-12.5 MG per tablet Take 1 tablet by mouth daily.    lithium 300 MG tablet Take 300 mg by mouth 2 (two) times daily.     metformin (FORTAMET) 1000 MG (OSM) 24 hr tablet Take 2,000 mg by mouth every evening.     metoprolol succinate (TOPROL-XL) 25 MG 24 hr tablet Take 25 mg by mouth daily.    tiotropium (SPIRIVA) 18 MCG inhalation capsule Place 18 mcg into inhaler and inhale daily.    traZODone (DESYREL) 100 MG tablet Take 100 mg by mouth at bedtime.    albuterol (PROVENTIL HFA;VENTOLIN HFA) 108 (90 BASE) MCG/ACT inhaler Inhale 1-2 puffs into the lungs every 6 (six) hours as needed for wheezing or shortness of breath. Qty: 1 Inhaler, Refills: 0      STOP taking these medications     clindamycin (CLEOCIN) 300 MG capsule      pravastatin (PRAVACHOL) 40 MG tablet      azithromycin (ZITHROMAX Z-PAK) 250 MG tablet        Allergies  Allergen Reactions  . Penicillins Rash      The results of significant diagnostics from this hospitalization (including imaging, microbiology, ancillary and laboratory) are listed below for reference.    Significant Diagnostic Studies: Ct Head Wo Contrast  09/04/2014   CLINICAL DATA:  Right arm numbness, mouth numbness, symptoms for 5 days.  EXAM: CT HEAD WITHOUT CONTRAST  TECHNIQUE: Contiguous axial images were obtained from the base of the skull through the vertex without intravenous contrast.  COMPARISON:  None.  FINDINGS: Age-indeterminate small lacunar infarct in the left basal ganglia. No intracranial hemorrhage, mass effect, or midline shift. No hydrocephalus. The basilar cisterns are patent. No evidence of territorial infarct. No intracranial fluid collection. Calvarium is intact. Frontal sinuses are hypo pneumatized. The mastoid air cells are well aerated.  IMPRESSION: Age indeterminate tiny lacunar infarct in the left basal ganglia.   Electronically Signed   By: Jeb Levering M.D.   On:  09/04/2014 00:19   Mri Brain Without Contrast  09/04/2014   CLINICAL DATA:  Right arm numbness for the past 5 days.  EXAM: MRI HEAD WITHOUT CONTRAST  TECHNIQUE: Multiplanar, multiecho pulse sequences of the brain and surrounding structures were obtained without intravenous contrast.  COMPARISON:  09/03/2014  FINDINGS: Calvarium and upper cervical spine: No marrow signal abnormality.  Orbits: No significant findings.  Sinuses: Clear. Mastoid and middle ears are clear.  Brain: Confirmed 1 cm area of restricted diffusion in the outer left thalamus, consistent with lacunar infarct. There is no associated hemorrhage. No evidence of major vessel occlusion.  Mild patchy T2 and FLAIR signal hyperintensity about the lateral ventricles. Although there is periventricular predominance, patient's history of hypertension, diabetes, and chronic kidney disease strongly favors small vessel ischemia over demyelinating disorder.  No hydrocephalus, mass lesion, or shift.  IMPRESSION: Confirmed acute lacunar infarct of the left thalamus.   Electronically Signed   By: Monte Fantasia M.D.   On: 09/04/2014 10:13   US Carotid Bilateral  09/04/2014   CLINICAL DATA:  44 year old female with acute left  basal ganglia stroke. Evaluate for carotid artery disease.  EXAM: BILATERAL CAROTID DUPLEX ULTRASOUND  TECHNIQUE: Pearline Cables scale imaging, color Doppler and duplex ultrasound were performed of bilateral carotid and vertebral arteries in the neck.  COMPARISON:  Brain MRI 09/04/2014  FINDINGS: Criteria: Quantification of carotid stenosis is based on velocity parameters that correlate the residual internal carotid diameter with NASCET-based stenosis levels, using the diameter of the distal internal carotid lumen as the denominator for stenosis measurement.  The following velocity measurements were obtained:  RIGHT  ICA:  56/20 cm/sec  CCA:  [id number] cm/sec  SYSTOLIC ICA/CCA RATIO:  0.7  DIASTOLIC ICA/CCA RATIO:  1.2  ECA:  72 cm/sec  LEFT  ICA:  64/19  cm/sec  CCA:  [id number] cm/sec  SYSTOLIC ICA/CCA RATIO:  0.9  DIASTOLIC ICA/CCA RATIO:  1.9  ECA:  70 set cm/sec  RIGHT CAROTID ARTERY: No significant atherosclerotic plaque or evidence of stenosis.  RIGHT VERTEBRAL ARTERY:  Patent with normal antegrade flow.  LEFT CAROTID ARTERY: No significant atherosclerotic plaque or evidence of stenosis.  LEFT VERTEBRAL ARTERY:  Patent with antegrade flow.  The bilateral carotid bifurcations are slightly high. Technically challenging examination secondary to patient body habitus and deep respirations. Imaging quality is mildly degraded but remains diagnostic.  IMPRESSION: No evidence of significant atherosclerotic plaque or stenosis in either carotid artery.  Vertebral arteries are patent with antegrade flow.  Signed,  Criselda Peaches, MD  [facility]  [facility]   Electronically Signed   By: Jacqulynn Cadet M.D.   On: 09/04/2014 10:31    Microbiology: No results found for this or any previous visit (from the past 240 hour(s)).   Labs: Basic Metabolic Panel:  Recent Labs Lab 09/03/14 2255 09/05/14 0456  NA 137 140  K 3.8 3.8  CL 106 107  CO2 25 28  GLUCOSE 213* 84  BUN 19 18  CREATININE 1.52* 1.13*  CALCIUM 8.0* 8.7   Liver Function Tests: No results for input(s): AST, ALT, ALKPHOS, BILITOT, PROT, ALBUMIN in the last 168 hours. No results for input(s): LIPASE, AMYLASE in the last 168 hours. No results for input(s): AMMONIA in the last 168 hours. CBC:  Recent Labs Lab 09/03/14 2255 09/05/14 0456  WBC 10.0 6.4  NEUTROABS 6.2  --   HGB 12.3 13.4  HCT 37.9 41.2  MCV 78.5 79.5  PLT 305 339   Cardiac Enzymes:  Recent Labs Lab 09/03/14 2255  TROPONINI <0.03   BNP: BNP (last 3 results) No results for input(s): BNP in the last 8760 hours.  ProBNP (last 3 results) No results for input(s): PROBNP in the last 8760 hours.  CBG:  Recent Labs Lab 09/04/14 0738 09/04/14 1148  09/04/14 1640 09/04/14 2047 09/05/14 0735  GLUCAP 205* 253* 179* 246* 87       Signed:  Hasna Stefanik  [facility] 09/05/2014, 11:22 AM

## 2014-09-06 LAB — HOMOCYSTEINE: Homocysteine: 15.5 umol/L — ABNORMAL HIGH (ref 0.0–15.0)

## 2014-09-06 LAB — PROTEIN C, TOTAL: Protein C, Total: 94 % (ref 70–140)

## 2014-09-07 LAB — CARDIOLIPIN ANTIBODIES, IGG, IGM, IGA
Anticardiolipin IgA: <9 APL U/mL (ref 0–11)
Anticardiolipin IgG: <9 GPL U/mL (ref 0–14)
Anticardiolipin IgM: <9 MPL U/mL (ref 0–12)

## 2014-09-07 LAB — FACTOR 5 LEIDEN

## 2014-09-09 LAB — LUPUS ANTICOAGULANT PANEL
DRVVT: 35.6 s (ref 0.0–55.1)
PTT Lupus Anticoagulant: 38.5 s (ref 0.0–50.0)

## 2014-09-09 LAB — PROTEIN S, TOTAL: Protein S Ag, Total: >150 % — ABNORMAL HIGH (ref 58–150)

## 2014-09-09 LAB — PROTEIN C ACTIVITY: Protein C Activity: 141 % (ref 74–151)

## 2014-09-09 LAB — PROTEIN S ACTIVITY: Protein S Activity: 88 % (ref 60–145)

## 2014-10-08 ENCOUNTER — Encounter (HOSPITAL_COMMUNITY): Payer: Self-pay | Admitting: Occupational Therapy

## 2014-10-08 ENCOUNTER — Ambulatory Visit (HOSPITAL_COMMUNITY): Payer: Medicaid Other | Attending: Nurse Practitioner | Admitting: Occupational Therapy

## 2014-10-08 DIAGNOSIS — M6281 Muscle weakness (generalized): Secondary | ICD-10-CM | POA: Diagnosis not present

## 2014-10-08 DIAGNOSIS — R29898 Other symptoms and signs involving the musculoskeletal system: Secondary | ICD-10-CM | POA: Insufficient documentation

## 2014-10-08 DIAGNOSIS — M2560 Stiffness of unspecified joint, not elsewhere classified: Secondary | ICD-10-CM | POA: Insufficient documentation

## 2014-10-08 DIAGNOSIS — R208 Other disturbances of skin sensation: Secondary | ICD-10-CM | POA: Diagnosis not present

## 2014-10-08 DIAGNOSIS — I6381 Other cerebral infarction due to occlusion or stenosis of small artery: Secondary | ICD-10-CM

## 2014-10-08 DIAGNOSIS — I639 Cerebral infarction, unspecified: Secondary | ICD-10-CM | POA: Insufficient documentation

## 2014-10-08 NOTE — Therapy (Signed)
[place], [place], [address] Phone: [phone]   Fax:  [phone]  Occupational Therapy Evaluation  Patient Details  Name: Samantha Pierce MRN: [id number] Date of Birth: [birth year] Referring Provider:  Wendie Simmer, MD  Encounter Date: 10/08/2014      OT End of Session - 10/08/14 1546    Visit Number 1   Number of Visits 24   Date for OT Re-Evaluation 12/07/14  mini reassessment 11/07/2014   Authorization Type Medicaid    Authorization Time Period Requesting 24 visits    Authorization - Visit Number 1   Authorization - Number of Visits 24   OT Start Time 1350   OT Stop Time 1430   OT Time Calculation (min) 40 min   Activity Tolerance Patient tolerated treatment well   Behavior During Therapy [facility] for tasks assessed/performed      Past Medical History  Diagnosis Date  . Hypertension   . Blood transfusion   . Diabetes mellitus with diabetic retinopathy   . Depression   . Anemia   . Chronic kidney disease protein high in urine     fo;;owed by pmp  . COPD (chronic obstructive pulmonary disease)   . Lacunar stroke, acute 09/04/2014  . Paresthesias/numbness 09/04/2014    Secondary to stroke.  . Diastolic dysfunction [id number]    Grade 2. EF 55-60%.    Past Surgical History  Procedure Laterality Date  . Dilation and curettage of uterus    . Cesarean section      x2  . Tubal ligation    . Pars plana vitrectomy  04/20/2011    Procedure: PARS PLANA VITRECTOMY WITH 25 GAUGE;  Surgeon: Hayden Pedro, MD;  Location: [place];  Service: Ophthalmology;  Laterality: Left;  membrane peel, gas fluid exchange, endolaser, repair of complex retinal detachment  . Eye surgery  right eye pars plano vitrectomy .   Marland Kitchen Lasik      There were no vitals filed for this visit.  Visit Diagnosis:  Lacunar stroke  Decreased range of motion  Decreased grip strength of right hand  Decreased pinch strength  Decreased  sensation      Subjective Assessment - 10/08/14 1539    Subjective  S: I can't feel anything with this side of my hand.    Pertinent History Pt is a 44 y/o female s/p left lacunar stroke between 08/31/2014 and 09/07/2014. Pt presents with deficits in her right hand including decreased grasp, coordination, and sensation. Pt was referred to occupational therapy for evaluation and treatment by Dr. Izola Price.    Patient Stated Goals To be able to use my hand like normal    Currently in Pain? No/denies           [facility] OT Assessment - 10/08/14 1409    Assessment   Diagnosis left lacunar stroke   Onset Date 09/03/14  Between 08/31/2014 and 09/07/2014   Prior Therapy None   Precautions   Precautions None   Balance Screen   Has the patient fallen in the past 6 months No   Has the patient had a decrease in activity level because of a fear of falling?  No   Is the patient reluctant to leave their home because of a fear of falling?  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   ADL   ADL comments Pt has difficulty fixing, tying son's shoes, cooking, buttoning shirts/bra, washing hair, holding onto objects     Written Expression   Dominant Hand Left   Vision - History   Baseline Vision Wears glasses all the time   Visual History Glaucome  cataracts; previously detached retina   Cognition   Overall Cognitive Status Within Functional Limits for tasks assessed   Observation/Other Assessments   Observations Pt is able to close hand to create 75% fist    Sensation   Light Touch Impaired Detail   Light Touch Impaired Details Impaired RUE   Stereognosis Appears Intact   Additional Comments Pt has impaired light touch, and protective  (sharp/dull) along radial aspect of right hand   Coordination   9 Hole Peg Test Left;Right   Right 9 Hole Peg Test 1'17'   Left 9 Hole Peg Test 22   AROM   Overall AROM Comments WNL   AROM Assessment Site Shoulder   Strength   Right/Left hand Right;Left   Gross Grasp Impaired   Grip (lbs) R: 15   Right Hand Lateral Pinch 12 lbs   Right Hand 3 Point Pinch 5 lbs   Grip (lbs) L: 75   Left Hand Lateral Pinch 26 lbs   Left Hand 3 Point Pinch 22 lbs            OT Education - 10/08/14 1546    Education provided Yes   Education Details theraputty    Person(s) Educated Patient   Methods Explanation;Demonstration;Handout   Comprehension Verbalized understanding;Returned demonstration          OT Short Term Goals - 10/08/14 1552    OT SHORT TERM GOAL #1   Title Pt will be educated on HEP.    Time 6   Period Weeks   Status New   OT SHORT TERM GOAL #2   Title Pt will increase right grip strength by 7# to increase ability to fix her hair.    Time 6   Period Weeks   Status New   OT SHORT TERM GOAL #3   Title Pt wil increase right pinch strength by 4# to increase ability to hold onto utensils or tools when completing daily tasks.    Time 6   Period Weeks   Status New   OT SHORT TERM GOAL #4   Title Pt will improve coordination by completing the 9 hole peg test in less than 1 minute using right hand.    Time 6   Period Weeks   Status New   OT SHORT TERM GOAL #5   Title Pt will improve range of motion of right hand by making a complete fist to increase ability to hold onto objects.    Time 6   Period Weeks   Status New           OT Long Term Goals - 10/08/14 1555    OT LONG TERM GOAL #1   Title Pt will return to highest level of functioning and independence when completing daily activities.    Time 12   Period Weeks   Status New   OT LONG TERM GOAL #2   Title Pt will increase grip strength by 12# to increase ability to lift pots and pans when cooking.    Time  12   Period Weeks  Status New   OT LONG TERM GOAL #3   Title Pt will increase pinch strength by 6# to increase ability to tie her son's shoes.    Time 12   Period Weeks   Status New   OT LONG TERM GOAL #4   Title Pt will improve coordination by completing the 9 hole peg test in under 45 seconds using right hand.    Time 12   Period Weeks   Status New   OT LONG TERM GOAL #5   Title Pt will demonstrate increased sensation in right hand to improve ability to cook without burning herself.    Time 12   Period Weeks   Status New               Plan - 10/08/14 1547    Clinical Impression Statement A: Pt is a 44 y/o female s/p left lacunar stroke between 08/30/24 and 09/07/2014 resulting in numbness, weakness, and paresthesias in RUE. Pt presents with numbess, tingling, decreased strength, and decreased coordination in right hand and fingers. Pt reports difficulty completing daily living activities and caring for her children due to effects of CVA.    Pt will benefit from skilled therapeutic intervention in order to improve on the following deficits (Retired) Decreased strength;Impaired sensation;Decreased activity tolerance;Impaired UE functional use;Decreased range of motion;Decreased coordination;Impaired flexibility   Rehab Potential Good   OT Frequency 2x / week   OT Duration 12 weeks   OT Treatment/Interventions Self-care/ADL training;Passive range of motion;Patient/family education;Cryotherapy;Electrical Stimulation;Manual Therapy;Moist Heat;Therapeutic exercise;Therapeutic activities   Plan P: Pt would benefit from skilled OT intervention to improve strength, coordination, and range of motion in order to return to prior level of independence with all B/IADLs and leisure activities.Next visit: test hot/cold sensation.    OT Home Exercise Plan theraputty    Consulted and Agree with Plan of Care Patient        Problem List Patient Active Problem List   Diagnosis Date Noted  .  Diastolic dysfunction 99991111  . Essential hypertension 09/04/2014  . Diabetes mellitus with diabetic retinopathy 09/04/2014  . Lacunar stroke, acute 09/04/2014  . Paresthesias/numbness 09/04/2014  . Chronic kidney disease, stage II (mild) 09/04/2014  . Obesity 09/04/2014  . CVA (cerebral infarction) 09/04/2014  . Proliferative diabetic retinopathy of both eyes 04/11/2011  . Retinal detachment, traction 04/11/2011    Guadelupe Sabin, OTR/L  [phone]  10/08/2014, 4:02 PM  [place] [address] [place], [place], [address] Phone: [phone]   Fax:  [phone]

## 2014-10-08 NOTE — Patient Instructions (Signed)
Home Exercises Program Theraputty Exercises  Do the following exercises 2 times a day using your affected hand.  1. Roll putty into a ball.  2. Make into a pancake.  3. Roll putty into a roll.  4. Pinch along log with first finger and thumb.   5. Make into a ball.  6. Roll it back into a log.   7. Pinch using thumb and side of first finger.  8. Roll into a ball, then flatten into a pancake.  9. Using your fingers, make putty into a mountain.   

## 2014-10-20 ENCOUNTER — Telehealth (HOSPITAL_COMMUNITY): Payer: Self-pay

## 2014-10-20 NOTE — Telephone Encounter (Signed)
5/17 called to schedule visits - 24 approved by insurance.... She has to check on childcare and will call back to schedule

## 2014-11-06 ENCOUNTER — Encounter (HOSPITAL_COMMUNITY): Payer: Self-pay | Admitting: Occupational Therapy

## 2014-11-06 ENCOUNTER — Ambulatory Visit (HOSPITAL_COMMUNITY): Payer: Medicaid Other | Attending: Nurse Practitioner | Admitting: Occupational Therapy

## 2014-11-06 DIAGNOSIS — I639 Cerebral infarction, unspecified: Secondary | ICD-10-CM | POA: Insufficient documentation

## 2014-11-06 DIAGNOSIS — M2560 Stiffness of unspecified joint, not elsewhere classified: Secondary | ICD-10-CM | POA: Insufficient documentation

## 2014-11-06 DIAGNOSIS — R29898 Other symptoms and signs involving the musculoskeletal system: Secondary | ICD-10-CM | POA: Diagnosis not present

## 2014-11-06 DIAGNOSIS — M6281 Muscle weakness (generalized): Secondary | ICD-10-CM | POA: Diagnosis not present

## 2014-11-06 DIAGNOSIS — R208 Other disturbances of skin sensation: Secondary | ICD-10-CM | POA: Diagnosis not present

## 2014-11-06 NOTE — Therapy (Signed)
[place], [place], [address] Phone: [phone]   Fax:  [phone]  Occupational Therapy Treatment  Patient Details  Name: Samantha Pierce MRN: [id number] Date of Birth: May 22, 1971 Referring Provider:  Yevette Edwards, NP  Encounter Date: 11/06/2014      OT End of Session - 11/06/14 1547    Visit Number 2   Number of Visits 24   Date for OT Re-Evaluation 12/07/14  mini reassessment 11/07/2014   Authorization Type Medicaid    Authorization Time Period Medicaid approved 24 visits through 01/04/2015   Authorization - Visit Number 1   Authorization - Number of Visits 24   OT Start Time 1515   OT Stop Time 1535   OT Time Calculation (min) 20 min   Activity Tolerance Patient tolerated treatment well   Behavior During Therapy [facility] for tasks assessed/performed      Past Medical History  Diagnosis Date  . Hypertension   . Blood transfusion   . Diabetes mellitus with diabetic retinopathy   . Depression   . Anemia   . Chronic kidney disease protein high in urine     fo;;owed by pmp  . COPD (chronic obstructive pulmonary disease)   . Lacunar stroke, acute 09/04/2014  . Paresthesias/numbness 09/04/2014    Secondary to stroke.  . Diastolic dysfunction [id number]    Grade 2. EF 55-60%.    Past Surgical History  Procedure Laterality Date  . Dilation and curettage of uterus    . Cesarean section      x2  . Tubal ligation    . Pars plana vitrectomy  04/20/2011    Procedure: PARS PLANA VITRECTOMY WITH 25 GAUGE;  Surgeon: Hayden Pedro, MD;  Location: [place];  Service: Ophthalmology;  Laterality: Left;  membrane peel, gas fluid exchange, endolaser, repair of complex retinal detachment  . Eye surgery  right eye pars plano vitrectomy .   Marland Kitchen Lasik      There were no vitals filed for this visit.  Visit Diagnosis:  Decreased range of motion  Decreased grip strength of right hand  Decreased pinch strength  Decreased  sensation      Subjective Assessment - 11/06/14 1555    Subjective  S: I still can't tie my son's shoe   Pertinent History --   Patient Stated Goals --   Currently in Pain? Yes   Pain Score 7    Pain Location Back  tip of thumb & middle finger   Pain Orientation Right   Pain Descriptors / Indicators Burning   Pain Type Acute pain            [facility] OT Assessment - 11/06/14 1514    Assessment   Diagnosis left lacunar stroke   Precautions   Precautions None            OT Treatments/Exercises (OP) - 11/06/14 1520    Exercises   Exercises Hand;Wrist   Wrist Exercises   Wrist Flexion PROM;10 reps   Wrist Extension PROM;10 reps   Additional Wrist Exercises   Sponges 7, 7   Hand Gripper with Large Beads 6/6 beads with gripper set at 11#            OT Short Term Goals - 11/06/14 1553    OT SHORT TERM GOAL #1   Title Pt will be educated on HEP.    Time 6   Period Weeks   Status On-going   OT SHORT TERM  GOAL #2   Title Pt will increase right grip strength by 7# to increase ability to fix her hair.    Time 6   Period Weeks   Status On-going   OT SHORT TERM GOAL #3   Title Pt wil increase right pinch strength by 4# to increase ability to hold onto utensils or tools when completing daily tasks.    Time 6   Period Weeks   Status On-going   OT SHORT TERM GOAL #4   Title Pt will improve coordination by completing the 9 hole peg test in less than 1 minute using right hand.    Time 6   Period Weeks   Status On-going   OT SHORT TERM GOAL #5   Title Pt will improve range of motion of right hand by making a complete fist to increase ability to hold onto objects.    Time 6   Period Weeks   Status On-going           OT Long Term Goals - 11/06/14 1553    OT LONG TERM GOAL #1   Title Pt will return to highest level of functioning and independence when completing daily activities.    Time 12   Period Weeks   Status On-going   OT LONG TERM GOAL #2   Title Pt  will increase grip strength by 12# to increase ability to lift pots and pans when cooking.    Time 12   Period Weeks   Status On-going   OT LONG TERM GOAL #3   Title Pt will increase pinch strength by 6# to increase ability to tie her son's shoes.    Time 12   Period Weeks   Status On-going   OT LONG TERM GOAL #4   Title Pt will improve coordination by completing the 9 hole peg test in under 45 seconds using right hand.    Time 12   Period Weeks   Status On-going   OT LONG TERM GOAL #5   Title Pt will demonstrate increased sensation in right hand to improve ability to cook without burning herself.    Time 12   Period Weeks   Status On-going               Plan - 11/06/14 1550    Clinical Impression Statement A: First treatment session since evaluation on 10/08/2014. Pt reports tingling in tips of thumb, index, and middle fingers versus original "numb heaviness." Pt also reports pain along LUE into shoulder and in her back, has an appt with MD to discuss. Pt now has min-mod fascial restictions in left dorsal forearm. Pt requested to leave at 1535 due to transportation issues. Initiated wrist PROM, limited due to pain, and grip strengthening exercises. Pt tolerated treatment well.    Plan P: MFR, PROM, AROM wrist/forearm/elbow exercises.Grip strengthening & fine motor coordination.         Problem List Patient Active Problem List   Diagnosis Date Noted  . Diastolic dysfunction 99991111  . Essential hypertension 09/04/2014  . Diabetes mellitus with diabetic retinopathy 09/04/2014  . Lacunar stroke, acute 09/04/2014  . Paresthesias/numbness 09/04/2014  . Chronic kidney disease, stage II (mild) 09/04/2014  . Obesity 09/04/2014  . CVA (cerebral infarction) 09/04/2014  . Proliferative diabetic retinopathy of both eyes 04/11/2011  . Retinal detachment, traction 04/11/2011    Guadelupe Sabin, OTR/L  [phone]  11/06/2014, 3:57 PM  [place],  [place], [address] Phone: [phone]   Fax:  [phone]

## 2014-11-11 ENCOUNTER — Encounter (HOSPITAL_COMMUNITY): Payer: Self-pay | Admitting: Occupational Therapy

## 2014-11-11 NOTE — Therapy (Signed)
[place], [place], [address] Phone: [phone]   Fax:  [phone]  Patient Details  Name: Samantha Pierce MRN: [id number] Date of Birth: 1971-06-05 Referring Provider:  No ref. provider found  Encounter Date: 11/11/2014  Second attempt at a signed certification order sent to MD today.    Guadelupe Sabin, OTR/L  [phone]  11/11/2014, 8:35 AM  [place] [address] [place], [place], [address] Phone: [phone]   Fax:  [phone]

## 2014-11-18 ENCOUNTER — Ambulatory Visit (HOSPITAL_COMMUNITY): Payer: Medicaid Other | Admitting: Specialist

## 2014-11-20 ENCOUNTER — Encounter (HOSPITAL_COMMUNITY): Payer: Medicaid Other | Admitting: Occupational Therapy

## 2014-11-24 ENCOUNTER — Encounter (HOSPITAL_COMMUNITY): Payer: Self-pay | Admitting: Occupational Therapy

## 2014-11-24 ENCOUNTER — Ambulatory Visit (HOSPITAL_COMMUNITY): Payer: Medicaid Other | Admitting: Occupational Therapy

## 2014-11-24 DIAGNOSIS — R29898 Other symptoms and signs involving the musculoskeletal system: Secondary | ICD-10-CM

## 2014-11-24 DIAGNOSIS — M2560 Stiffness of unspecified joint, not elsewhere classified: Secondary | ICD-10-CM

## 2014-11-24 DIAGNOSIS — I639 Cerebral infarction, unspecified: Secondary | ICD-10-CM | POA: Diagnosis not present

## 2014-11-24 NOTE — Therapy (Signed)
Samantha Pierce, [place], [address] Phone: [phone]   Fax:  [phone]  Occupational Therapy Treatment  Patient Details  Name: Samantha Pierce MRN: [id number] Date of Birth: [birth year] Referring Provider:  Yevette Edwards, NP  Encounter Date: 11/24/2014      OT End of Session - 11/24/14 1613    Visit Number 3   Number of Visits 24   Date for OT Re-Evaluation 12/07/14  mini reassessment 11/07/2014   Authorization Type Medicaid    Authorization Time Period Medicaid approved 24 visits through 01/04/2015   Authorization - Visit Number 2   Authorization - Number of Visits 24   OT Start Time 1515   OT Stop Time 1600   OT Time Calculation (min) 45 min   Activity Tolerance Patient tolerated treatment well   Behavior During Therapy [facility] for tasks assessed/performed      Past Medical History  Diagnosis Date  . Hypertension   . Blood transfusion   . Diabetes mellitus with diabetic retinopathy   . Depression   . Anemia   . Chronic kidney disease protein high in urine     fo;;owed by pmp  . COPD (chronic obstructive pulmonary disease)   . Lacunar stroke, acute 09/04/2014  . Paresthesias/numbness 09/04/2014    Secondary to stroke.  . Diastolic dysfunction [id number]    Grade 2. EF 55-60%.    Past Surgical History  Procedure Laterality Date  . Dilation and curettage of uterus    . Cesarean section      x2  . Tubal ligation    . Pars plana vitrectomy  04/20/2011    Procedure: PARS PLANA VITRECTOMY WITH 25 GAUGE;  Surgeon: Hayden Pedro, MD;  Location: [place];  Service: Ophthalmology;  Laterality: Left;  membrane peel, gas fluid exchange, endolaser, repair of complex retinal detachment  . Eye surgery  right eye pars plano vitrectomy .   Marland Kitchen Lasik      There were no vitals filed for this visit.  Visit Diagnosis:  Decreased range of motion  Decreased grip strength of right hand  Decreased pinch strength       Subjective Assessment - 11/24/14 1520    Subjective  S: My arm busted open good last week.    Currently in Pain? Yes   Pain Score 8    Pain Location --  tip of index finger   Pain Orientation Right   Pain Descriptors / Indicators Burning   Pain Type Acute pain            [facility] OT Assessment - 11/24/14 1520    Assessment   Diagnosis left lacunar stroke   Precautions   Precautions None           OT Treatments/Exercises (OP) - 11/24/14 1532    Exercises   Exercises Hand;Wrist   Wrist Exercises   Wrist Flexion PROM;AROM;10 reps   Wrist Extension PROM;AROM;10 reps   Wrist Radial Deviation PROM;AROM;10 reps   Wrist Ulnar Deviation PROM;AROM;10 reps   Additional Wrist Exercises   Sponges 12, 13   Hand Gripper with Large Beads 6/6 beads with gripper at 15#   Hand Gripper with Medium Beads 13/13 beads with gripper set at 15#  1 rest break   Hand Gripper with Small Beads 17/17 beads with gripper set at 15#  rest breaks due to pain in shoulder           OT Short Term Goals - 11/06/14  [place] #1   Title Pt will be educated on HEP.    Time 6   Period Weeks   Status On-going   OT SHORT TERM GOAL #2   Title Pt will increase right grip strength by 7# to increase ability to fix her hair.    Time 6   Period Weeks   Status On-going   OT SHORT TERM GOAL #3   Title Pt wil increase right pinch strength by 4# to increase ability to hold onto utensils or tools when completing daily tasks.    Time 6   Period Weeks   Status On-going   OT SHORT TERM GOAL #4   Title Pt will improve coordination by completing the 9 hole peg test in less than 1 minute using right hand.    Time 6   Period Weeks   Status On-going   OT SHORT TERM GOAL #5   Title Pt will improve range of motion of right hand by making a complete fist to increase ability to hold onto objects.    Time 6   Period Weeks   Status On-going           OT Long Term Goals - 11/06/14 1553    OT  LONG TERM GOAL #1   Title Pt will return to highest level of functioning and independence when completing daily activities.    Time 12   Period Weeks   Status On-going   OT LONG TERM GOAL #2   Title Pt will increase grip strength by 12# to increase ability to lift pots and pans when cooking.    Time 12   Period Weeks   Status On-going   OT LONG TERM GOAL #3   Title Pt will increase pinch strength by 6# to increase ability to tie her son's shoes.    Time 12   Period Weeks   Status On-going   OT LONG TERM GOAL #4   Title Pt will improve coordination by completing the 9 hole peg test in under 45 seconds using right hand.    Time 12   Period Weeks   Status On-going   OT LONG TERM GOAL #5   Title Pt will demonstrate increased sensation in right hand to improve ability to cook without burning herself.    Time 12   Period Weeks   Status On-going               Plan - 11/24/14 1613    Clinical Impression Statement A: Pt reports pain in tip of index finger as well as upper arm this session. Pt reports her upper arm was hard and warm last week and "busted open." Pt completed AROM and grip strengthening exercises, with notable improvement in in-hand manipulation during sponge activity. Pt tolerated treatment well. Pt spoke briefly with SLP about obtaining speech referral, request for referral sent to MD.    Plan P: Add theraputty tasks-grip/roll/pinch        Problem List Patient Active Problem List   Diagnosis Date Noted  . Diastolic dysfunction 99991111  . Essential hypertension 09/04/2014  . Diabetes mellitus with diabetic retinopathy 09/04/2014  . Lacunar stroke, acute 09/04/2014  . Paresthesias/numbness 09/04/2014  . Chronic kidney disease, stage II (mild) 09/04/2014  . Obesity 09/04/2014  . CVA (cerebral infarction) 09/04/2014  . Proliferative diabetic retinopathy of both eyes 04/11/2011  . Retinal detachment, traction 04/11/2011    Samantha Pierce, OTR/L   [phone] 11/24/2014,  4:17 PM  [place], [place], [address] Phone: [phone]   Fax:  [phone]

## 2014-11-26 ENCOUNTER — Encounter (HOSPITAL_COMMUNITY): Payer: Self-pay | Admitting: *Deleted

## 2014-11-26 ENCOUNTER — Ambulatory Visit (HOSPITAL_COMMUNITY): Payer: Medicaid Other

## 2014-11-26 ENCOUNTER — Emergency Department (HOSPITAL_COMMUNITY)
Admission: EM | Admit: 2014-11-26 | Discharge: 2014-11-27 | Disposition: A | Discharge status: Home / Self Care | Payer: Medicaid Other | Attending: Emergency Medicine | Admitting: Emergency Medicine

## 2014-11-26 DIAGNOSIS — Z87891 Personal history of nicotine dependence: Secondary | ICD-10-CM | POA: Diagnosis not present

## 2014-11-26 DIAGNOSIS — W01198A Fall on same level from slipping, tripping and stumbling with subsequent striking against other object, initial encounter: Secondary | ICD-10-CM | POA: Insufficient documentation

## 2014-11-26 DIAGNOSIS — Z880 Allergy status to penicillin: Secondary | ICD-10-CM | POA: Insufficient documentation

## 2014-11-26 DIAGNOSIS — Z794 Long term (current) use of insulin: Secondary | ICD-10-CM | POA: Diagnosis not present

## 2014-11-26 DIAGNOSIS — Z79899 Other long term (current) drug therapy: Secondary | ICD-10-CM | POA: Insufficient documentation

## 2014-11-26 DIAGNOSIS — Z8673 Personal history of transient ischemic attack (TIA), and cerebral infarction without residual deficits: Secondary | ICD-10-CM | POA: Insufficient documentation

## 2014-11-26 DIAGNOSIS — Y929 Unspecified place or not applicable: Secondary | ICD-10-CM | POA: Insufficient documentation

## 2014-11-26 DIAGNOSIS — Z862 Personal history of diseases of the blood and blood-forming organs and certain disorders involving the immune mechanism: Secondary | ICD-10-CM | POA: Diagnosis not present

## 2014-11-26 DIAGNOSIS — N189 Chronic kidney disease, unspecified: Secondary | ICD-10-CM | POA: Diagnosis not present

## 2014-11-26 DIAGNOSIS — J449 Chronic obstructive pulmonary disease, unspecified: Secondary | ICD-10-CM | POA: Diagnosis not present

## 2014-11-26 DIAGNOSIS — Z7982 Long term (current) use of aspirin: Secondary | ICD-10-CM | POA: Insufficient documentation

## 2014-11-26 DIAGNOSIS — F329 Major depressive disorder, single episode, unspecified: Secondary | ICD-10-CM | POA: Insufficient documentation

## 2014-11-26 DIAGNOSIS — Y9389 Activity, other specified: Secondary | ICD-10-CM | POA: Insufficient documentation

## 2014-11-26 DIAGNOSIS — E11319 Type 2 diabetes mellitus with unspecified diabetic retinopathy without macular edema: Secondary | ICD-10-CM | POA: Insufficient documentation

## 2014-11-26 DIAGNOSIS — S0990XA Unspecified injury of head, initial encounter: Secondary | ICD-10-CM | POA: Diagnosis present

## 2014-11-26 DIAGNOSIS — Y998 Other external cause status: Secondary | ICD-10-CM | POA: Diagnosis not present

## 2014-11-26 DIAGNOSIS — I129 Hypertensive chronic kidney disease with stage 1 through stage 4 chronic kidney disease, or unspecified chronic kidney disease: Secondary | ICD-10-CM | POA: Insufficient documentation

## 2014-11-26 DIAGNOSIS — R220 Localized swelling, mass and lump, head: Secondary | ICD-10-CM

## 2014-11-26 LAB — CBG MONITORING, ED: Glucose-Capillary: 186 mg/dL — ABNORMAL HIGH (ref 65–99)

## 2014-11-26 MED ORDER — OXYCODONE-ACETAMINOPHEN 5-325 MG PO TABS
1.0000 | ORAL_TABLET | Freq: Once | ORAL | Status: AC
  Administered 2014-11-26: 1 via ORAL
  Filled 2014-11-26: qty 1

## 2014-11-26 MED ORDER — SULFAMETHOXAZOLE-TRIMETHOPRIM 800-160 MG PO TABS: 1.0000 | ORAL_TABLET | Freq: Two times a day (BID) | ORAL | Status: AC

## 2014-11-26 MED ORDER — SULFAMETHOXAZOLE-TRIMETHOPRIM 800-160 MG PO TABS
1.0000 | ORAL_TABLET | Freq: Once | ORAL | Status: AC
  Administered 2014-11-26: 1 via ORAL
  Filled 2014-11-26: qty 1

## 2014-11-26 NOTE — Discharge Instructions (Signed)

## 2014-11-26 NOTE — ED Provider Notes (Signed)
CSN: [id number]     Arrival date & time 11/26/14  2016 History  This chart was scribed for Jola Schmidt, MD by Rayfield Citizen, ED Scribe. This patient was seen in room APA04/APA04 and the patient's care was started at 11:33 PM.    Chief Complaint  Patient presents with  . Fall   The history is provided by the patient. No language interpreter was used.     HPI Comments: Samantha Pierce is a 44 y.o. female with past medical history of prior lacunar stroke (09/04/2014) who presents to the Emergency Department complaining of fall. Patient explains she was seated on a cooler this afternoon when she tipped backward, hitting her head on the floor. She tried to stand and fell forward. Per nurse's note, patient wanted to be evaluated after her fall today due to prior strokes. Patient states she was mildly dizzy before the fall but denies any dizziness at this time.  The preceding palpitations or chest pain.  She denies LOC with either fall. She denies weakness or new or unusual numbness (some at baseline as a deficit from prior stroke)  Patient also notes an abscess to her right nostril that is causing her discomfort. This began three days ago.   She denies anticoagulant use.   Past Medical History  Diagnosis Date  . Hypertension   . Blood transfusion   . Diabetes mellitus with diabetic retinopathy   . Depression   . Anemia   . Chronic kidney disease protein high in urine     fo;;owed by pmp  . COPD (chronic obstructive pulmonary disease)   . Lacunar stroke, acute 09/04/2014  . Paresthesias/numbness 09/04/2014    Secondary to stroke.  . Diastolic dysfunction [id number]    Grade 2. EF 55-60%.   Past Surgical History  Procedure Laterality Date  . Dilation and curettage of uterus    . Cesarean section      x2  . Tubal ligation    . Pars plana vitrectomy  04/20/2011    Procedure: PARS PLANA VITRECTOMY WITH 25 GAUGE;  Surgeon: Hayden Pedro, MD;  Location: [facility];  Service: Ophthalmology;   Laterality: Left;  membrane peel, gas fluid exchange, endolaser, repair of complex retinal detachment  . Eye surgery  right eye pars plano vitrectomy .   Marland Kitchen Lasik     Family History  Problem Relation Age of Onset  . Diabetes Other    History  Substance Use Topics  . Smoking status: Former Smoker -- 0.03 packs/day for 30 years    Types: Cigarettes    Quit date: 04/04/2014  . Smokeless tobacco: Never Used  . Alcohol Use: No   OB History    Gravida Para Term Preterm AB TAB SAB Ectopic Multiple Living   2 2 2       2      Review of Systems  A complete 10 system review of systems was obtained and all systems are negative except as noted in the HPI and PMH.    Allergies  Penicillins  Home Medications   Prior to Admission medications   Medication Sig Start Date End Date Taking? Authorizing Provider  albuterol (PROVENTIL HFA;VENTOLIN HFA) 108 (90 BASE) MCG/ACT inhaler Inhale 1-2 puffs into the lungs every 6 (six) hours as needed for wheezing or shortness of breath. 06/20/14   Nat Christen, MD  aspirin 325 MG tablet Take 1 tablet (325 mg total) by mouth daily. 09/05/14   Rexene Alberts, MD  atorvastatin (LIPITOR) 40 MG  tablet Take 1 tablet (40 mg total) by mouth daily at 6 PM. New medication to treat her high cholesterol. 09/05/14   Rexene Alberts, MD  cloNIDine (CATAPRES) 0.2 MG tablet Take 0.2 mg by mouth daily.    Historical Provider, MD  FLUoxetine (PROZAC) 10 MG capsule Take 10 mg by mouth daily.    Historical Provider, MD  gabapentin (NEURONTIN) 300 MG capsule Take 1 capsule (300 mg total) by mouth at bedtime as needed (For numbness and tingling). 09/05/14   Rexene Alberts, MD  insulin aspart (NOVOLOG) 100 UNIT/ML FlexPen Inject 0-18 Units into the skin 3 (three) times daily with meals.    Historical Provider, MD  Insulin Detemir (LEVEMIR) 100 UNIT/ML Pen Inject 45 Units into the skin 2 (two) times daily at 8 am and 10 pm. 09/05/14   Rexene Alberts, MD  lisinopril-hydrochlorothiazide  (PRINZIDE,ZESTORETIC) 20-12.5 MG per tablet Take 1 tablet by mouth daily.    Historical Provider, MD  lithium 300 MG tablet Take 300 mg by mouth 2 (two) times daily.     Historical Provider, MD  metformin (FORTAMET) 1000 MG (OSM) 24 hr tablet Take 2,000 mg by mouth every evening.     Historical Provider, MD  metoprolol succinate (TOPROL-XL) 25 MG 24 hr tablet Take 25 mg by mouth daily.    Historical Provider, MD  tiotropium (SPIRIVA) 18 MCG inhalation capsule Place 18 mcg into inhaler and inhale daily.    Historical Provider, MD  traZODone (DESYREL) 100 MG tablet Take 100 mg by mouth at bedtime.    Historical Provider, MD   BP 185/78 mmHg  Pulse 62  Temp(Src) 98.1 F (36.7 C) (Oral)  Resp 16  Ht 5\' 4"  (1.626 m)  Wt 277 lb (125.646 kg)  BMI 47.52 kg/m2  SpO2 100%  LMP 10/31/2014 Physical Exam  Constitutional: She is oriented to person, place, and time. She appears well-developed and well-nourished. No distress.  HENT:  Head: Normocephalic and atraumatic.  Mild right periorbital tenderness without significant swelling. Mild swelling to the outer aspect of the right nare, no surrounding erythema.  Eyes: EOM are normal.  Neck: Normal range of motion.  Cardiovascular: Normal rate, regular rhythm and normal heart sounds.   Pulmonary/Chest: Effort normal and breath sounds normal.  Abdominal: Soft. She exhibits no distension. There is no tenderness.  Musculoskeletal: Normal range of motion.  No cervical tenderness  Neurological: She is alert and oriented to person, place, and time.  Skin: Skin is warm and dry.  Psychiatric: She has a normal mood and affect. Judgment normal.  Nursing note and vitals reviewed.   ED Course  Procedures   INCISION AND DRAINAGE Performed by: Hoy Morn Consent: Verbal consent obtained. Risks and benefits: risks, benefits and alternatives were discussed Time out performed prior to procedure Type: abscess Body area: right nare Anesthesia: local  infiltration Incision was made with a 21 gauge needlel. Local anesthetic: none Drainage: purulent Drainage amount: small Packing material: none Patient tolerance: Patient tolerated the procedure well with no immediate complications.     DIAGNOSTIC STUDIES: Oxygen Saturation is 100% on RA, normal by my interpretation.    COORDINATION OF CARE: 11:39 PM Discussed treatment plan with pt at bedside and pt agreed to plan.   Labs Review Labs Reviewed - No data to display  Imaging Review No results found.   EKG Interpretation None      MDM   Final diagnoses:  Minor head injury, initial encounter  Nasal swelling    Very small punctate  amount of pus obtained after needle drainage of the right neck.  I suspect the majority of this is more induration and mild cellulitis.  Patient be started on antibiotics.  Warm compresses.  No indication for more aggressive incision with scalpel this time.  In regards to her fall this appears to be more related to balance given her recent stroke.  I don't think she's had a new stroke.  No indication for CT imaging of her head as I doubt she's had any significant traumatic head injury.  No use of anticoagulants.  Normal neurologic exam.  Discharge home in good condition.  Patient understands return to the ER for new or worsening symptoms   I personally performed the services described in this documentation, which was scribed in my presence. The recorded information has been reviewed and is accurate.       Jola Schmidt, MD 11/27/14 [phone]

## 2014-11-26 NOTE — ED Notes (Addendum)
Pt states that she fell twice this afternoon.  Reporting pain above right eye.  States that she had a history of stroke so wanted to be checked since she hit her head.  Also reporting abscess in nose that is causing pain.  Reports she was dizzy before fall, but nothing at present.  Pt alert and oriented during triage.

## 2014-12-01 ENCOUNTER — Ambulatory Visit (HOSPITAL_COMMUNITY): Payer: Medicaid Other | Admitting: Occupational Therapy

## 2014-12-01 DIAGNOSIS — R29898 Other symptoms and signs involving the musculoskeletal system: Secondary | ICD-10-CM

## 2014-12-01 DIAGNOSIS — M2560 Stiffness of unspecified joint, not elsewhere classified: Secondary | ICD-10-CM

## 2014-12-01 DIAGNOSIS — I639 Cerebral infarction, unspecified: Secondary | ICD-10-CM | POA: Diagnosis not present

## 2014-12-01 DIAGNOSIS — R208 Other disturbances of skin sensation: Secondary | ICD-10-CM

## 2014-12-01 NOTE — Therapy (Signed)
[place], [place], [address] Phone: [phone]   Fax:  [phone]  Occupational Therapy Treatment  Patient Details  Name: Samantha Pierce MRN: [id number] Date of Birth: Nov 01, 1970 Referring Provider:  Yevette Edwards, NP  Encounter Date: 12/01/2014      OT End of Session - 12/01/14 1606    Visit Number 4   Number of Visits 24   Date for OT Re-Evaluation 12/07/14   Authorization Type Medicaid    Authorization Time Period Medicaid approved 24 visits through 01/04/2015   Authorization - Visit Number 3   Authorization - Number of Visits 24   OT Start Time [id number]   OT Stop Time 1603   OT Time Calculation (min) 45 min   Activity Tolerance Patient tolerated treatment well   Behavior During Therapy [facility] for tasks assessed/performed      Past Medical History  Diagnosis Date  . Hypertension   . Blood transfusion   . Diabetes mellitus with diabetic retinopathy   . Depression   . Anemia   . Chronic kidney disease protein high in urine     fo;;owed by pmp  . COPD (chronic obstructive pulmonary disease)   . Lacunar stroke, acute 09/04/2014  . Paresthesias/numbness 09/04/2014    Secondary to stroke.  . Diastolic dysfunction [id number]    Grade 2. EF 55-60%.    Past Surgical History  Procedure Laterality Date  . Dilation and curettage of uterus    . Cesarean section      x2  . Tubal ligation    . Pars plana vitrectomy  04/20/2011    Procedure: PARS PLANA VITRECTOMY WITH 25 GAUGE;  Surgeon: Hayden Pedro, MD;  Location: [facility];  Service: Ophthalmology;  Laterality: Left;  membrane peel, gas fluid exchange, endolaser, repair of complex retinal detachment  . Eye surgery  right eye pars plano vitrectomy .   Marland Kitchen Lasik      There were no vitals filed for this visit.  Visit Diagnosis:  Decreased range of motion  Decreased grip strength of right hand  Decreased pinch strength  Decreased sensation      Subjective  Assessment - 12/01/14 1519    Subjective  S: Still a little a pain in my elbow and numbness in my finger.    Currently in Pain? No/denies          OT Treatments/Exercises (OP) - 12/01/14 1521    Exercises   Exercises Hand;Wrist   Wrist Exercises   Wrist Flexion PROM;AROM;10 reps   Wrist Extension PROM;AROM;10 reps   Wrist Radial Deviation PROM;AROM;10 reps   Wrist Ulnar Deviation PROM;AROM;10 reps   Additional Wrist Exercises   Theraputty Flatten;Roll;Grip;Pinch   Theraputty - Flatten red   Theraputty - Roll red   Theraputty - Grip red-supinated & pronated   Theraputty - Pinch red-3 pt & lateral   Theraputty - Locate Pegs 8/8 pegs with extended time   Hand Gripper with Large Beads 6/6 beads with gripper at 15#   Hand Gripper with Medium Beads 13/13 beads with gripper set at 15#  extended time and 2 rest breaks   Hand Gripper with Small Beads 17/17 beads with gripper set at 15#  extended time & 3 rest breaks           OT Short Term Goals - 11/06/14 1553    OT SHORT TERM GOAL #1   Title Pt will be educated on HEP.    Time 6  Period Weeks   Status On-going   OT SHORT TERM GOAL #2   Title Pt will increase right grip strength by 7# to increase ability to fix her hair.    Time 6   Period Weeks   Status On-going   OT SHORT TERM GOAL #3   Title Pt wil increase right pinch strength by 4# to increase ability to hold onto utensils or tools when completing daily tasks.    Time 6   Period Weeks   Status On-going   OT SHORT TERM GOAL #4   Title Pt will improve coordination by completing the 9 hole peg test in less than 1 minute using right hand.    Time 6   Period Weeks   Status On-going   OT SHORT TERM GOAL #5   Title Pt will improve range of motion of right hand by making a complete fist to increase ability to hold onto objects.    Time 6   Period Weeks   Status On-going           OT Long Term Goals - 11/06/14 1553    OT LONG TERM GOAL #1   Title Pt will  return to highest level of functioning and independence when completing daily activities.    Time 12   Period Weeks   Status On-going   OT LONG TERM GOAL #2   Title Pt will increase grip strength by 12# to increase ability to lift pots and pans when cooking.    Time 12   Period Weeks   Status On-going   OT LONG TERM GOAL #3   Title Pt will increase pinch strength by 6# to increase ability to tie her son's shoes.    Time 12   Period Weeks   Status On-going   OT LONG TERM GOAL #4   Title Pt will improve coordination by completing the 9 hole peg test in under 45 seconds using right hand.    Time 12   Period Weeks   Status On-going   OT LONG TERM GOAL #5   Title Pt will demonstrate increased sensation in right hand to improve ability to cook without burning herself.    Time 12   Period Weeks   Status On-going               Plan - 12/01/14 1606    Clinical Impression Statement A: Added red theraputty tasks-grip, pinch, roll, flatten, and locating pegs. Pt required extended time and multiple rest breaks throughout session due to pain and fatigue in right hand. Pt tolerated treatment well. Pt reports less pain in hand this session compared to previous session.    Plan A: Add pegboard task focusing on coordination.         Problem List Patient Active Problem List   Diagnosis Date Noted  . Diastolic dysfunction 99991111  . Essential hypertension 09/04/2014  . Diabetes mellitus with diabetic retinopathy 09/04/2014  . Lacunar stroke, acute 09/04/2014  . Paresthesias/numbness 09/04/2014  . Chronic kidney disease, stage II (mild) 09/04/2014  . Obesity 09/04/2014  . CVA (cerebral infarction) 09/04/2014  . Proliferative diabetic retinopathy of both eyes 04/11/2011  . Retinal detachment, traction 04/11/2011    Guadelupe Sabin, OTR/L  [phone]  12/01/2014, 4:10 PM  [facility] [address] [place], [place], [address] Phone:  [phone]   Fax:  [phone]

## 2014-12-03 ENCOUNTER — Ambulatory Visit (INDEPENDENT_AMBULATORY_CARE_PROVIDER_SITE_OTHER): Payer: Medicaid Other | Admitting: Ophthalmology

## 2014-12-03 DIAGNOSIS — E10319 Type 1 diabetes mellitus with unspecified diabetic retinopathy without macular edema: Secondary | ICD-10-CM

## 2014-12-03 DIAGNOSIS — H35033 Hypertensive retinopathy, bilateral: Secondary | ICD-10-CM

## 2014-12-03 DIAGNOSIS — I10 Essential (primary) hypertension: Secondary | ICD-10-CM

## 2014-12-03 DIAGNOSIS — E10359 Type 1 diabetes mellitus with proliferative diabetic retinopathy without macular edema: Secondary | ICD-10-CM

## 2014-12-04 ENCOUNTER — Ambulatory Visit (HOSPITAL_COMMUNITY): Payer: Medicaid Other | Attending: Nurse Practitioner | Admitting: Occupational Therapy

## 2014-12-08 ENCOUNTER — Ambulatory Visit (HOSPITAL_COMMUNITY): Payer: Medicaid Other

## 2014-12-11 ENCOUNTER — Ambulatory Visit (HOSPITAL_COMMUNITY): Payer: Medicaid Other | Admitting: Occupational Therapy

## 2014-12-15 ENCOUNTER — Encounter (HOSPITAL_COMMUNITY): Payer: Medicaid Other | Admitting: Occupational Therapy

## 2014-12-17 ENCOUNTER — Encounter (HOSPITAL_COMMUNITY): Payer: Medicaid Other

## 2014-12-22 ENCOUNTER — Encounter (HOSPITAL_COMMUNITY): Payer: Medicaid Other | Admitting: Occupational Therapy

## 2014-12-25 ENCOUNTER — Encounter (HOSPITAL_COMMUNITY): Payer: Medicaid Other | Admitting: Occupational Therapy

## 2014-12-29 ENCOUNTER — Encounter (HOSPITAL_COMMUNITY): Payer: Medicaid Other | Admitting: Occupational Therapy

## 2015-01-01 ENCOUNTER — Encounter (HOSPITAL_COMMUNITY): Payer: Medicaid Other | Admitting: Occupational Therapy

## 2015-01-05 ENCOUNTER — Encounter (HOSPITAL_COMMUNITY): Payer: Medicaid Other

## 2015-01-08 ENCOUNTER — Ambulatory Visit (HOSPITAL_COMMUNITY): Payer: Medicaid Other | Attending: Nurse Practitioner | Admitting: Occupational Therapy

## 2015-01-08 DIAGNOSIS — I69922 Dysarthria following unspecified cerebrovascular disease: Secondary | ICD-10-CM | POA: Insufficient documentation

## 2015-01-11 ENCOUNTER — Ambulatory Visit (INDEPENDENT_AMBULATORY_CARE_PROVIDER_SITE_OTHER): Payer: Medicaid Other | Admitting: Ophthalmology

## 2015-01-12 ENCOUNTER — Encounter (HOSPITAL_COMMUNITY): Payer: Medicaid Other | Admitting: Occupational Therapy

## 2015-01-12 ENCOUNTER — Ambulatory Visit (HOSPITAL_COMMUNITY): Payer: Medicaid Other | Admitting: Speech Pathology

## 2015-01-15 ENCOUNTER — Encounter (HOSPITAL_COMMUNITY): Payer: Self-pay

## 2015-01-15 ENCOUNTER — Ambulatory Visit (HOSPITAL_COMMUNITY): Payer: Medicaid Other

## 2015-01-15 NOTE — Therapy (Signed)
[place], [place], [address] Phone: [phone]   Fax:  [phone]  Patient Details  Name: Samantha Pierce MRN: [id number] Date of Birth: 12-19-1970 Referring Provider:  No ref. provider found  Encounter Date: 01/15/2015 OCCUPATIONAL THERAPY DISCHARGE SUMMARY  Visits from Start of Care: 4  Current functional level related to goals / functional outcomes: OT LONG TERM GOAL #1    Title Pt will return to highest level of functioning and independence when completing daily activities.    Time 12   Period Weeks   Status On-going   OT LONG TERM GOAL #2   Title Pt will increase grip strength by 12# to increase ability to lift pots and pans when cooking.    Time 12   Period Weeks   Status On-going   OT LONG TERM GOAL #3   Title Pt will increase pinch strength by 6# to increase ability to tie her son's shoes.    Time 12   Period Weeks   Status On-going   OT LONG TERM GOAL #4   Title Pt will improve coordination by completing the 9 hole peg test in under 45 seconds using right hand.    Time 12   Period Weeks   Status On-going   OT LONG TERM GOAL #5   Title Pt will demonstrate increased sensation in right hand to improve ability to cook without burning herself.    Time 12   Period Weeks   Status On-going            Remaining deficits: Pt has not returned to clinic since 12/01/14 therefore it is unclear what deficits are remaining.    Education / Equipment: Retail banker activities. Plan:                                                    Patient goals were not met. Patient is being discharged due to not returning since the last visit.  ?????       Ailene Ravel, OTR/L,CBIS  [phone]  01/15/2015, 10:45 AM  [place], [place], [address] Phone:  [phone]   Fax:  [phone]

## 2015-01-19 ENCOUNTER — Encounter (HOSPITAL_COMMUNITY): Payer: Medicaid Other | Admitting: Occupational Therapy

## 2015-01-21 ENCOUNTER — Encounter (HOSPITAL_COMMUNITY): Payer: Medicaid Other

## 2015-01-25 ENCOUNTER — Ambulatory Visit (HOSPITAL_COMMUNITY): Payer: Medicaid Other | Admitting: Speech Pathology

## 2015-01-25 DIAGNOSIS — I69922 Dysarthria following unspecified cerebrovascular disease: Secondary | ICD-10-CM | POA: Diagnosis not present

## 2015-01-25 NOTE — Therapy (Signed)
[place], [place], [address] Phone: [phone]   Fax:  [phone]  Speech Language Pathology Evaluation  Patient Details  Name: Samantha Pierce MRN: [id number] Date of Birth: [birth year] Referring Provider:  Wendie Simmer, MD  Encounter Date: 01/25/2015      End of Session - 01/25/15 1318    Visit Number 1   Number of Visits 3   Date for SLP Re-Evaluation 02/25/15   Authorization Type Medicaid   SLP Start Time 0945   SLP Stop Time  1030   SLP Time Calculation (min) 45 min   Activity Tolerance Patient tolerated treatment well      Past Medical History  Diagnosis Date  . Hypertension   . Blood transfusion   . Diabetes mellitus with diabetic retinopathy   . Depression   . Anemia   . Chronic kidney disease protein high in urine     fo;;owed by pmp  . COPD (chronic obstructive pulmonary disease)   . Lacunar stroke, acute 09/04/2014  . Paresthesias/numbness 09/04/2014    Secondary to stroke.  . Diastolic dysfunction [id number]    Grade 2. EF 55-60%.    Past Surgical History  Procedure Laterality Date  . Dilation and curettage of uterus    . Cesarean section      x2  . Tubal ligation    . Pars plana vitrectomy  04/20/2011    Procedure: PARS PLANA VITRECTOMY WITH 25 GAUGE;  Surgeon: Hayden Pedro, MD;  Location: [place] [facility];  Service: Ophthalmology;  Laterality: Left;  membrane peel, gas fluid exchange, endolaser, repair of complex retinal detachment  . Eye surgery  right eye pars plano vitrectomy .   Marland Kitchen Lasik      There were no vitals filed for this visit.  Visit Diagnosis: Dysarthria as late effect of cerebrovascular disease      Subjective Assessment - 01/25/15 1309    Subjective "Sometimes I bite my tongue."   Currently in Pain? No/denies            SLP Evaluation [facility] - 01/25/15 1309    SLP Visit Information   SLP Received On 01/25/15   Onset Date April 2016   Medical Diagnosis s/p  CVA   Subjective   Subjective c/o biting cheek and tongue at times   Patient/Family Stated Goal not bite cheek, tongue; decrease saliva spillage   General Information   Other Pertinent Information Samantha Pierce is a 44 yo woman who was referred for SLP evaluation s/p left lacunar stroke between 08/31/2014 and 09/07/2014. She was initially only referred for OT, however Pt complained of difficulty with speech and oral motor sensation on right side of face. Pt referred by Sammuel Bailiff. Pt tells SLP that her "speech is just different" and the right side of her face/mouth "burns". She chews on the left side of her mouth so that she doesn't bite her cheek and tongue.   Behavioral/Cognition WNL   Mobility Status ambulatory   Prior Functional Status   Cognitive/Linguistic Baseline Within functional limits   Type of Home Apartment    Lives With [employer] work   Cognition   Overall Cognitive Status Impaired/Different from baseline   Area of Impairment Memory   Memory Decreased short-term memory   Memory Comments Pt reports difficulty remembering appointments   Memory Impaired   Memory Impairment Prospective memory   Awareness Appears intact   Problem Solving Appears intact   Auditory  Comprehension   Overall Auditory Comprehension Appears within functional limits for tasks assessed   Yes/No Questions Within Functional Limits   Commands Within Functional Limits   Conversation Complex   Visual Recognition/Discrimination   Discrimination Within Function Limits   Reading Comprehension   Reading Status Not tested   Expression   Primary Mode of Expression Verbal   Verbal Expression   Overall Verbal Expression Appears within functional limits for tasks assessed   Initiation No impairment   Level of Generative/Spontaneous Verbalization Conversation   Repetition No impairment   Naming No impairment   Pragmatics No impairment   Non-Verbal Means of Communication Not  applicable   Written Expression   Dominant Hand Left   Written Expression Not tested   Oral Motor/Sensory Function   Overall Oral Motor/Sensory Function Impaired   Labial ROM Within Functional Limits   Labial Strength Reduced Right   Labial Sensation Reduced Right   Labial Coordination Reduced   Lingual ROM Within Functional Limits   Lingual Symmetry Within Functional Limits   Lingual Strength Reduced Right   Lingual Sensation Reduced Right   Lingual Coordination Reduced   Facial ROM Within Functional Limits   Facial Symmetry Within Functional Limits   Facial Strength Within Functional Limits   Facial Sensation Reduced   Facial Coordination WFL   Velum Within Functional Limits   Mandible Within Functional Limits   Overall Oral Motor/Sensory Function decreased sensation   Motor Speech   Overall Motor Speech Appears within functional limits for tasks assessed   Respiration Within functional limits   Phonation Normal   Resonance Within functional limits   Articulation Within functional limitis   Intelligibility Intelligible   Motor Planning Witnin functional limits   Motor Speech Errors Not applicable   Phonation [facility]              ADULT SLP TREATMENT - 01/25/15 0001    General Information   HPI Ms. Samantha Pierce is a 44 yo woman who was referred for SLP evaluation s/p left lacunar stroke between 08/31/2014 and 09/07/2014. She was initially only referred for OT, however Pt complained of difficulty with speech and oral motor sensation on right side of face. Pt referred by Sammuel Bailiff. Pt tells SLP that her "speech is just different" and the right side of her face/mouth "burns". She chews on the left side of her mouth so that she doesn't bite her cheek and tongue.           SLP Education - 01/25/15 1316    Education provided Yes   Education Details diet recommendations, speech intelligibility strategies, oral motor exercises, calendar   Person(s) Educated Patient    Methods Explanation;Demonstration;Verbal cues;Handout   Comprehension Verbalized understanding;Need further instruction          SLP Short Term Goals - 01/25/15 1326    SLP SHORT TERM GOAL #1   Title Pt will complete oral motor exercises 10x each, 2-3x per day by pt report.   Baseline none   Time 4   Period Weeks   Status New   SLP SHORT TERM GOAL #2   Title Pt will record 3-5 items on weekly calendar to facilitate improved recall of appointments and daily activities.   Baseline None   Time 4   Period Weeks   Status New   SLP SHORT TERM GOAL #3   Title Pt will demonstrate safe and efficient consumption of self regulated regular textures and thin liquids with use of strategies independently.  Baseline mechanical soft   Time 4   Period Weeks   Status New          SLP Long Term Goals - 01/25/15 1329    SLP LONG TERM GOAL #1   Title Pt will be independent with home program for oral motor exercises.          Plan - 01/25/15 1318    Clinical Impression Statement Ms. Leitz had a stroke in April 2016 and presents with mild dysarthria (noticeable to patient only at this time), mild dysphagia, and mild short term and prospective memory deficits. Pt reports ongoing (but improving) tingling, numbness, and burning sensation on right side of mouth, tongue, and cheek which makes mastication of solids difficult. She also reports biting her cheek/tongue and experiences occasional loss of saliva from right side of mouth. She has difficulty remembering appointments and does not currently use a calendar/organization system. Recommend short term therapy as patient is willing to implement home exercise program to focus on oral motor exercises, implementation of compensatory strategies, and SLP recommendations for practice at home. Pt in agreement with plan of care. She also is requesting a referral for OT as she was unable to attend therapy earlier this summer. [facility] sent to [facility]   Speech Therapy Frequency 1x /week   Duration 2 weeks   Treatment/Interventions Diet toleration management by SLP;Compensatory techniques;SLP instruction and feedback;Compensatory strategies;Patient/family education;Internal/external aids   Potential to Achieve Goals Good   SLP Home Exercise Plan Pt will implement HEP as assigned to facilitate carryover of treatment strategies at home   Consulted and Agree with Plan of Care Patient        Problem List Patient Active Problem List   Diagnosis Date Noted  . Diastolic dysfunction 99991111  . Essential hypertension 09/04/2014  . Diabetes mellitus with diabetic retinopathy 09/04/2014  . Lacunar stroke, acute 09/04/2014  . Paresthesias/numbness 09/04/2014  . Chronic kidney disease, stage II (mild) 09/04/2014  . Obesity 09/04/2014  . CVA (cerebral infarction) 09/04/2014  . Proliferative diabetic retinopathy of both eyes 04/11/2011  . Retinal detachment, traction 04/11/2011   Thank you,  Genene Churn, [place]  [facility] 01/25/2015, 1:33 PM  [place] [address] [place], [place], [address] Phone: [phone]   Fax:  [phone]

## 2015-01-25 NOTE — Addendum Note (Signed)
Addended by: Ephraim Hamburger on: 01/25/2015 01:42 PM   Modules accepted: Orders

## 2015-01-26 ENCOUNTER — Encounter (HOSPITAL_COMMUNITY): Payer: Medicaid Other

## 2015-01-28 ENCOUNTER — Encounter (HOSPITAL_COMMUNITY): Payer: Medicaid Other | Admitting: Occupational Therapy

## 2015-02-02 ENCOUNTER — Encounter (HOSPITAL_COMMUNITY): Payer: Medicaid Other | Admitting: Occupational Therapy

## 2015-03-17 ENCOUNTER — Ambulatory Visit (HOSPITAL_COMMUNITY): Payer: Medicaid Other | Attending: Nurse Practitioner

## 2015-03-17 DIAGNOSIS — I6381 Other cerebral infarction due to occlusion or stenosis of small artery: Secondary | ICD-10-CM

## 2015-03-17 DIAGNOSIS — R208 Other disturbances of skin sensation: Secondary | ICD-10-CM

## 2015-03-17 DIAGNOSIS — M6281 Muscle weakness (generalized): Secondary | ICD-10-CM | POA: Insufficient documentation

## 2015-03-17 DIAGNOSIS — R29898 Other symptoms and signs involving the musculoskeletal system: Secondary | ICD-10-CM

## 2015-03-17 DIAGNOSIS — I639 Cerebral infarction, unspecified: Secondary | ICD-10-CM | POA: Insufficient documentation

## 2015-03-17 NOTE — Patient Instructions (Signed)
.  Desensitization Techniques  Perform these exercises ever 2 hours for 15 minute sessions.  Progress to the next exercises when the exercises you are doing become easy.  1)  Using light pressure, rub the various textures along with the hypersensitive area:  A.  Newman  C.  Wool  G.  Cotton material  D.  Terry cloth  2)  With the same textures use a firmer pressure. 3)  Use a hand held vibrator and massage along the sensitive area. 4)  With a small dowel rod, eraser on a pencil or base of an ink pen tap along the sensitive area. 5)  Use an empty roll-on deodorant bottle to roll along the sensitive area. 6)  Place your hand/forearm in separate containers of the following items:  A.  Sand  D.  Dry lentil beans  B.  Dry Rice  E.  Dry kidney beans  C.  Ball bearings F.  Dry pinto beans  Coordination Exercises  Perform the following exercises for 30 minutes 2-3 times per day. Perform with right hand(s). Perform using big movements.   Flipping Cards: Place deck of cards on the table. Flip cards over by opening your hand big to grasp and then turn your palm up big.  Deal cards: Hold 1/2 or whole deck in your hand. Use thumb to push card off top of deck with one big push.  Flip card between each finger.  Toss ball from one hand to the other: Toss big/high.  Toss ball in the air and catch with the same hand: Toss big/high.  Rotate 2 golf balls in your hand: Both directions.  Pick up coins and place in coin bank or container: Pick up with big, intentional movements. Do not drag coin to the edge.  Pick up coins and stack one at a time: Pick up with big, intentional movements. Do not drag coin to the edge. (5-10 in a stack)  Pick up 5-10 coins one at a time and hold in palm. Then, move coins from palm to fingertips one at a time to stack.  Perform "Flicks"/hand stretches (PWR! Hands): Close hands then flick out your fingers with focus on opening  hands, pulling wrists back, and extending elbows like you are pushing.

## 2015-03-17 NOTE — Therapy (Signed)
Samantha Pierce, [place], [address] Phone: [phone]   Fax:  [phone]  Patient Details  Name: Samantha Pierce MRN: [id number] Date of Birth: 1970/07/21 Referring Provider:  Wendie Simmer, MD  Encounter Date: 03/17/2015 OT screen completed this date. Patient has passed approved medicaid visits and is un eligible for more visits due to being past 6 months since having CVA. Patient is unable to self pay. Reviewed any issues that patient is still having including decreased sensation, grip and pinch strength. Patient's grip and pinch strength have increased since initial evaluation in April 2016. Right grip: 45#. Right 3 point pinch: 8#. Right lateral pinch: 12#. Patient was educated on desensitization techniques and coordination activities. Patient was given red and green theraputty to continue working on pinch and grip strengthening at home. Pt is able to continue independently with HEP at home.  All questions answered. No further OT needs at this time.   Ailene Ravel, OTR/L,CBIS  [phone]  03/17/2015, 10:36 AM  [facility] [address] [place], [place], [address] Phone: [phone]   Fax:  [phone]

## 2015-04-12 ENCOUNTER — Emergency Department (HOSPITAL_COMMUNITY)
Admission: EM | Admit: 2015-04-12 | Discharge: 2015-04-12 | Disposition: A | Discharge status: Home / Self Care | Payer: Medicaid Other | Attending: Emergency Medicine | Admitting: Emergency Medicine

## 2015-04-12 ENCOUNTER — Encounter (HOSPITAL_COMMUNITY): Payer: Self-pay | Admitting: Emergency Medicine

## 2015-04-12 ENCOUNTER — Emergency Department (HOSPITAL_COMMUNITY): Payer: Medicaid Other

## 2015-04-12 DIAGNOSIS — Z3202 Encounter for pregnancy test, result negative: Secondary | ICD-10-CM | POA: Insufficient documentation

## 2015-04-12 DIAGNOSIS — Z880 Allergy status to penicillin: Secondary | ICD-10-CM | POA: Diagnosis not present

## 2015-04-12 DIAGNOSIS — Z87891 Personal history of nicotine dependence: Secondary | ICD-10-CM | POA: Insufficient documentation

## 2015-04-12 DIAGNOSIS — Z79899 Other long term (current) drug therapy: Secondary | ICD-10-CM | POA: Insufficient documentation

## 2015-04-12 DIAGNOSIS — R079 Chest pain, unspecified: Secondary | ICD-10-CM | POA: Insufficient documentation

## 2015-04-12 DIAGNOSIS — Z794 Long term (current) use of insulin: Secondary | ICD-10-CM | POA: Insufficient documentation

## 2015-04-12 DIAGNOSIS — N189 Chronic kidney disease, unspecified: Secondary | ICD-10-CM | POA: Insufficient documentation

## 2015-04-12 DIAGNOSIS — Z862 Personal history of diseases of the blood and blood-forming organs and certain disorders involving the immune mechanism: Secondary | ICD-10-CM | POA: Insufficient documentation

## 2015-04-12 DIAGNOSIS — Z8673 Personal history of transient ischemic attack (TIA), and cerebral infarction without residual deficits: Secondary | ICD-10-CM | POA: Diagnosis not present

## 2015-04-12 DIAGNOSIS — I129 Hypertensive chronic kidney disease with stage 1 through stage 4 chronic kidney disease, or unspecified chronic kidney disease: Secondary | ICD-10-CM | POA: Diagnosis not present

## 2015-04-12 DIAGNOSIS — Z7982 Long term (current) use of aspirin: Secondary | ICD-10-CM | POA: Diagnosis not present

## 2015-04-12 DIAGNOSIS — J449 Chronic obstructive pulmonary disease, unspecified: Secondary | ICD-10-CM | POA: Diagnosis not present

## 2015-04-12 DIAGNOSIS — E11319 Type 2 diabetes mellitus with unspecified diabetic retinopathy without macular edema: Secondary | ICD-10-CM | POA: Insufficient documentation

## 2015-04-12 DIAGNOSIS — F329 Major depressive disorder, single episode, unspecified: Secondary | ICD-10-CM | POA: Insufficient documentation

## 2015-04-12 LAB — CBC WITH DIFFERENTIAL/PLATELET
Basophils Absolute: 0 10*3/uL (ref 0.0–0.1)
Basophils Relative: 0 %
Eosinophils Absolute: 0.6 10*3/uL (ref 0.0–0.7)
Eosinophils Relative: 9 %
HCT: 40.2 % (ref 36.0–46.0)
Hemoglobin: 12.8 g/dL (ref 12.0–15.0)
Lymphocytes Relative: 30 %
Lymphs Abs: 2 10*3/uL (ref 0.7–4.0)
MCH: 25 pg — ABNORMAL LOW (ref 26.0–34.0)
MCHC: 31.8 g/dL (ref 30.0–36.0)
MCV: 78.4 fL (ref 78.0–100.0)
Monocytes Absolute: 0.7 10*3/uL (ref 0.1–1.0)
Monocytes Relative: 11 %
Neutro Abs: 3.3 10*3/uL (ref 1.7–7.7)
Neutrophils Relative %: 50 %
Platelets: 252 10*3/uL (ref 150–400)
RBC: 5.13 MIL/uL — ABNORMAL HIGH (ref 3.87–5.11)
RDW: 13.5 % (ref 11.5–15.5)
WBC: 6.5 10*3/uL (ref 4.0–10.5)

## 2015-04-12 LAB — COMPREHENSIVE METABOLIC PANEL
ALT: 15 U/L (ref 14–54)
AST: 21 U/L (ref 15–41)
Albumin: 2.2 g/dL — ABNORMAL LOW (ref 3.5–5.0)
Alkaline Phosphatase: 47 U/L (ref 38–126)
Anion gap: 7 (ref 5–15)
BUN: 18 mg/dL (ref 6–20)
CO2: 26 mmol/L (ref 22–32)
Calcium: 8.6 mg/dL — ABNORMAL LOW (ref 8.9–10.3)
Chloride: 107 mmol/L (ref 101–111)
Creatinine, Ser: 1.44 mg/dL — ABNORMAL HIGH (ref 0.44–1.00)
GFR calc Af Amer: 50 mL/min — ABNORMAL LOW (ref >60)
GFR calc non Af Amer: 43 mL/min — ABNORMAL LOW (ref >60)
Glucose, Bld: 97 mg/dL (ref 65–99)
Potassium: 3.6 mmol/L (ref 3.5–5.1)
Sodium: 140 mmol/L (ref 135–145)
Total Bilirubin: 0.4 mg/dL (ref 0.3–1.2)
Total Protein: 5.5 g/dL — ABNORMAL LOW (ref 6.5–8.1)

## 2015-04-12 LAB — POC URINE PREG, ED: Preg Test, Ur: NEGATIVE

## 2015-04-12 LAB — CBG MONITORING, ED
Glucose-Capillary: 41 mg/dL — CL (ref 65–99)
Glucose-Capillary: 110 mg/dL — ABNORMAL HIGH (ref 65–99)

## 2015-04-12 LAB — TROPONIN I
Troponin I: <0.03 ng/mL (ref <0.031)
Troponin I: <0.03 ng/mL (ref <0.031)

## 2015-04-12 LAB — BRAIN NATRIURETIC PEPTIDE: B Natriuretic Peptide: 78 pg/mL (ref 0.0–100.0)

## 2015-04-12 MED ORDER — DEXTROSE 50 % IV SOLN
INTRAVENOUS | Status: AC
  Filled 2015-04-12: qty 50

## 2015-04-12 MED ORDER — DEXTROSE 50 % IV SOLN
1.0000 | Freq: Once | INTRAVENOUS | Status: AC
  Administered 2015-04-12: 50 mL via INTRAVENOUS

## 2015-04-12 MED ORDER — IOHEXOL 350 MG/ML SOLN
80.0000 mL | Freq: Once | INTRAVENOUS | Status: AC | PRN
  Administered 2015-04-12: 80 mL via INTRAVENOUS

## 2015-04-12 NOTE — ED Notes (Signed)
Meal given

## 2015-04-12 NOTE — ED Provider Notes (Addendum)
CSN: [id number]     Arrival date & time 04/12/15  0910 History  By signing my name below, I, Soijett Blue, attest that this documentation has been prepared under the direction and in the presence of Milton Ferguson, MD. Electronically Signed: Soijett Blue, ED Scribe. 04/12/2015. 10:56 AM.   Chief Complaint  Patient presents with  . Chest Pain  . Hypertension     Patient is a 44 y.o. female presenting with chest pain and hypertension. The history is provided by the patient (the patient). No language interpreter was used.  Chest Pain Pain location:  Unable to specify Pain quality: radiating   Pain quality comment:  Compressing Pain radiates to:  Mid back Pain radiates to the back: yes   Pain severity:  Moderate Onset quality:  Unable to specify Duration:  2 months Timing:  Intermittent Progression:  Unchanged Chronicity:  New Context: breathing and at rest   Relieved by:  None tried Worsened by:  Nothing tried Ineffective treatments:  None tried Associated symptoms: no abdominal pain, no back pain, no cough, no fatigue and no headache   Risk factors: diabetes mellitus and hypertension   Hypertension Associated symptoms include chest pain. Pertinent negatives include no abdominal pain and no headaches.    HPI Comments: ROMEESA SCHICKEL is a 44 y.o. female with a medical hx of HTN, DM, COPD who presents to the Emergency Department complaining of intermittent, exertional CP onset 2 months. She notes that [facility] just referred her to a cardiology. She reports that she took her HTN medications and her blood pressure has been in the 200s. Denies having heart issues in the past and she notes that her symptoms are new since her stroke in 08/31/2014. She states that she is having associated symptoms of SOB and pain with taking a deep breath. She denies any other symptoms.   Past Medical History  Diagnosis Date  . Hypertension   . Blood transfusion   . Diabetes mellitus  with diabetic retinopathy   . Depression   . Anemia   . Chronic kidney disease protein high in urine     fo;;owed by pmp  . COPD (chronic obstructive pulmonary disease) ([place])   . Lacunar stroke, acute ([place]) 09/04/2014  . Paresthesias/numbness 09/04/2014    Secondary to stroke.  . Diastolic dysfunction [id number]    Grade 2. EF 55-60%.   Past Surgical History  Procedure Laterality Date  . Dilation and curettage of uterus    . Cesarean section      x2  . Tubal ligation    . Pars plana vitrectomy  04/20/2011    Procedure: PARS PLANA VITRECTOMY WITH 25 GAUGE;  Surgeon: Hayden Pedro, MD;  Location: [facility];  Service: Ophthalmology;  Laterality: Left;  membrane peel, gas fluid exchange, endolaser, repair of complex retinal detachment  . Eye surgery  right eye pars plano vitrectomy .   Marland Kitchen Lasik     Family History  Problem Relation Age of Onset  . Diabetes Other    Social History  Substance Use Topics  . Smoking status: Former Smoker -- 0.03 packs/day for 30 years    Types: Cigarettes    Quit date: 04/04/2014  . Smokeless tobacco: Never Used  . Alcohol Use: No   OB History    Gravida Para Term Preterm AB TAB SAB Ectopic Multiple Living   2 2 2       2      Review of Systems  Constitutional: Negative  for appetite change and fatigue.  HENT: Negative for congestion, ear discharge and sinus pressure.   Eyes: Negative for discharge.  Respiratory: Negative for cough.   Cardiovascular: Positive for chest pain.  Gastrointestinal: Negative for abdominal pain and diarrhea.  Genitourinary: Negative for frequency and hematuria.  Musculoskeletal: Negative for back pain.  Skin: Negative for rash.  Neurological: Negative for seizures and headaches.  Psychiatric/Behavioral: Negative for hallucinations.      Allergies  Penicillins  Home Medications   Prior to Admission medications   Medication Sig Start Date End Date Taking? Authorizing Provider  albuterol (PROVENTIL HFA;VENTOLIN HFA)  108 (90 BASE) MCG/ACT inhaler Inhale 1-2 puffs into the lungs every 6 (six) hours as needed for wheezing or shortness of breath. 06/20/14  Yes Nat Christen, MD  aspirin 325 MG tablet Take 1 tablet (325 mg total) by mouth daily. 09/05/14  Yes Rexene Alberts, MD  atorvastatin (LIPITOR) 40 MG tablet Take 1 tablet (40 mg total) by mouth daily at 6 PM. New medication to treat her high cholesterol. 09/05/14  Yes Rexene Alberts, MD  canagliflozin (INVOKANA) 100 MG TABS tablet Take 100 mg by mouth daily before breakfast.   Yes Historical Provider, MD  cloNIDine (CATAPRES) 0.2 MG tablet Take 0.2 mg by mouth daily.   Yes Historical Provider, MD  FLUoxetine (PROZAC) 10 MG capsule Take 10 mg by mouth daily.   Yes Historical Provider, MD  gabapentin (NEURONTIN) 300 MG capsule Take 1 capsule (300 mg total) by mouth at bedtime as needed (For numbness and tingling). 09/05/14  Yes Rexene Alberts, MD  insulin aspart (NOVOLOG) 100 UNIT/ML FlexPen Inject 0-18 Units into the skin 3 (three) times daily with meals.   Yes Historical Provider, MD  Insulin Detemir (LEVEMIR) 100 UNIT/ML Pen Inject 45 Units into the skin 2 (two) times daily at 8 am and 10 pm. Patient taking differently: Inject 60 Units into the skin at bedtime.  09/05/14  Yes Rexene Alberts, MD  lisinopril-hydrochlorothiazide (PRINZIDE,ZESTORETIC) 20-12.5 MG per tablet Take 1 tablet by mouth daily.   Yes Historical Provider, MD  lithium 300 MG tablet Take 300 mg by mouth 2 (two) times daily.    Yes Historical Provider, MD  metoprolol succinate (TOPROL-XL) 25 MG 24 hr tablet Take 25 mg by mouth daily.   Yes Historical Provider, MD  tiotropium (SPIRIVA) 18 MCG inhalation capsule Place 18 mcg into inhaler and inhale daily.   Yes Historical Provider, MD  traZODone (DESYREL) 100 MG tablet Take 100 mg by mouth at bedtime.   Yes Historical Provider, MD   BP 156/86 mmHg  Pulse 66  Temp(Src) 97.7 F (36.5 C) (Oral)  Resp 20  Ht 5\' 4"  (1.626 m)  Wt 276 lb (125.193 kg)  BMI 47.35  kg/m2  SpO2 100%  LMP 03/29/2015 Physical Exam  Constitutional: She is oriented to person, place, and time. She appears well-developed.  HENT:  Head: Normocephalic.  Eyes: Conjunctivae and EOM are normal. No scleral icterus.  Neck: Neck supple. No thyromegaly present.  Cardiovascular: Normal rate and regular rhythm.  Exam reveals no gallop and no friction rub.   No murmur heard. Pulmonary/Chest: No stridor. She has no wheezes. She has no rales. She exhibits no tenderness.  Abdominal: She exhibits no distension. There is no tenderness. There is no rebound.  Musculoskeletal: Normal range of motion. She exhibits no edema.  Lymphadenopathy:    She has no cervical adenopathy.  Neurological: She is oriented to person, place, and time. She exhibits normal muscle tone.  Coordination normal.  Skin: No rash noted. No erythema.  Psychiatric: She has a normal mood and affect. Her behavior is normal.  Nursing note and vitals reviewed.   ED Course  Procedures (including critical care time) DIAGNOSTIC STUDIES: Oxygen Saturation is 100% on RA, nl by my interpretation.    COORDINATION OF CARE: 10:54 AM Discussed treatment plan with pt at bedside which includes labs and CXR and pt agreed to plan.    Labs Review Labs Reviewed  CBC WITH DIFFERENTIAL/PLATELET - Abnormal; Notable for the following:    RBC 5.13 (*)    MCH 25.0 (*)    All other components within normal limits  COMPREHENSIVE METABOLIC PANEL - Abnormal; Notable for the following:    Creatinine, Ser 1.44 (*)    Calcium 8.6 (*)    Total Protein 5.5 (*)    Albumin 2.2 (*)    GFR calc non Af Amer 43 (*)    GFR calc Af Amer 50 (*)    All other components within normal limits  TROPONIN I    Imaging Review Dg Chest 2 View  04/12/2015  CLINICAL DATA:  Left-sided chest pain with shortness of breath for 2 months EXAM: CHEST  2 VIEW COMPARISON:  Oct 28, 2014 FINDINGS: The lungs are clear. The heart size and pulmonary vascularity are  normal. No adenopathy. No bone lesions. There is arthropathy in the lateral left clavicle. IMPRESSION: No edema or consolidation. Arthropathic change in the lateral left clavicle with cortical irregularity along the lateral left clavicle. No joint space narrowing in this area. Electronically Signed   By: Lowella Grip III M.D.   On: 04/12/2015 10:15   I have personally reviewed and evaluated these images and lab results as part of my medical decision-making.   EKG Interpretation   Date/Time:  Monday April 12 2015 09:28:06 EST Ventricular Rate:  66 PR Interval:  171 QRS Duration: 84 QT Interval:  382 QTC Calculation: 400 R Axis:   6 Text Interpretation:  Sinus rhythm Baseline wander in lead(s) V2 Confirmed  by Ellia Knowlton  MD, Nakai Yard [phone]) on 04/12/2015 2:20:10 PM      MDM   Final diagnoses:  None    Patient has had chest pain off-and-on for 2 months now. Labs unremarkable including 2 troponins that were completely normal. CT of the chest negative for PE or major vessel disease. Doubt chest pain is cardiac related. Patient will be referred to cardiology for possible further workup.  The chart was scribed for me under my direct supervision.  I personally performed the history, physical, and medical decision making and all procedures in the evaluation of this patient.Milton Ferguson, MD 04/12/15 1423  Milton Ferguson, MD 04/12/15 [phone]

## 2015-04-12 NOTE — Discharge Instructions (Signed)
Follow up with the cardiologist in a week.  Return if problems

## 2015-04-12 NOTE — ED Notes (Signed)
Pt reports low blood sugar, pt felt like the room is spinning. Pt given D50, orange juice, ice chips, and peanutbutter crackers.  Pt states she feels better and is sitting up eating at this time. Pt alert and oriented.

## 2015-04-12 NOTE — ED Notes (Signed)
Having chest pain for last 2 months on and off.  BP at [facility] this am 216/112.  History of stroke April 2016.

## 2015-04-28 ENCOUNTER — Other Ambulatory Visit (HOSPITAL_COMMUNITY): Payer: Self-pay | Admitting: Respiratory Therapy

## 2015-04-28 DIAGNOSIS — G4733 Obstructive sleep apnea (adult) (pediatric): Secondary | ICD-10-CM

## 2015-05-20 ENCOUNTER — Encounter: Payer: Self-pay | Admitting: Cardiology

## 2015-05-20 ENCOUNTER — Ambulatory Visit (INDEPENDENT_AMBULATORY_CARE_PROVIDER_SITE_OTHER): Payer: Medicaid Other | Admitting: Cardiology

## 2015-05-20 VITALS — BP 138/88 | HR 77 | Ht 64.0 in | Wt 277.0 lb

## 2015-05-20 DIAGNOSIS — I10 Essential (primary) hypertension: Secondary | ICD-10-CM

## 2015-05-20 DIAGNOSIS — Z8673 Personal history of transient ischemic attack (TIA), and cerebral infarction without residual deficits: Secondary | ICD-10-CM

## 2015-05-20 DIAGNOSIS — E785 Hyperlipidemia, unspecified: Secondary | ICD-10-CM

## 2015-05-20 DIAGNOSIS — R072 Precordial pain: Secondary | ICD-10-CM | POA: Diagnosis not present

## 2015-05-20 NOTE — Patient Instructions (Signed)
Your physician recommends that you schedule a follow-up appointment in: to be determined after test  Your physician has requested that you have a lexiscan myoview 2 DAY SCAN. For further information please visit [email or web address]. Please follow instruction sheet, as given.    Your physician recommends that you continue on your current medications as directed. Please refer to the Current Medication list given to you today.    If you need a refill on your cardiac medications before your next appointment, please call your pharmacy.   Thank you for choosing [facility] !

## 2015-05-20 NOTE — Progress Notes (Signed)
.    Cardiology Office Note  Date: 05/20/2015   ID: Samantha Pierce, DOB [birth year], MRN [id number]  PCP: Samantha Spry, NP  Consulting Cardiologist: Samantha Lesches, MD   Chief Complaint  Patient presents with  . Chest Pain    History of Present Illness: Samantha Pierce is a 44 y.o. female referred for cardiology consultation by Mr. Wynell Balloon NP. I reviewed her records and updated the chart. History includes poorly controlled hypertension with hypertensive urgency at times, also acute lacunar stroke of the left thalamus back in April of this year in the setting of high blood pressure. She was seen in the ER in November with chest discomfort, troponin I levels were normal and ECG showed sinus rhythm without acute ST segment changes. CT imaging showed no evidence of primary embolus or aortic dissection.  She tells me that she began experiencing intermittent left-sided anterior and posterior thoracic discomfort back in August. This has been fairly sporadic, sometimes lasts for several minutes, feels like a pressure. She does not endorse any clear onset of symptoms with typical activity. Recently her blood pressure has been getting better, she has had adjustments in her medications, and also reports reducing sodium in her diet. She has been trying to walk slowly for exercise. She does have a 26 year history of diabetes mellitus.  I reviewed her echocardiogram done back in April, outlined below. She has not undergone any previous ischemic testing.  Medications reviewed today as well. Antihypertensives include clonidine, Prinzide, and Toprol-XL.  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  Past Medical History  Diagnosis Date  . Essential hypertension   . History of blood transfusion   . Diabetes mellitus with diabetic retinopathy   . Depression   . Anemia   . CKD (chronic kidney disease) stage 3, GFR 30-59 ml/min   . COPD (chronic obstructive pulmonary disease) ([place])   . History  of stroke April 2016    Acute lacunar infarct of the left thalamus    Past Surgical History  Procedure Laterality Date  . Dilation and curettage of uterus    . Cesarean section      x2  . Tubal ligation    . Pars plana vitrectomy  04/20/2011    Procedure: PARS PLANA VITRECTOMY WITH 25 GAUGE;  Surgeon: Hayden Pedro, MD;  Location: [facility];  Service: Ophthalmology;  Laterality: Left;  membrane peel, gas fluid exchange, endolaser, repair of complex retinal detachment  . Eye surgery      Right eye pars plano vitrectomy   . Lasik      Current Outpatient Prescriptions  Medication Sig Dispense Refill  . albuterol (PROVENTIL HFA;VENTOLIN HFA) 108 (90 BASE) MCG/ACT inhaler Inhale 1-2 puffs into the lungs every 6 (six) hours as needed for wheezing or shortness of breath. 1 Inhaler 0  . aspirin 325 MG tablet Take 1 tablet (325 mg total) by mouth daily.    Marland Kitchen atorvastatin (LIPITOR) 40 MG tablet Take 1 tablet (40 mg total) by mouth daily at 6 PM. New medication to treat her high cholesterol. 30 tablet 3  . canagliflozin (INVOKANA) 100 MG TABS tablet Take 100 mg by mouth daily before breakfast.    . cloNIDine (CATAPRES) 0.2 MG tablet Take 0.2 mg by mouth daily.    Marland Kitchen FLUoxetine (PROZAC) 10 MG capsule Take 10 mg by mouth daily.    Marland Kitchen gabapentin (NEURONTIN) 300 MG capsule Take 1 capsule (300 mg total) by mouth at bedtime as needed (  For numbness and tingling).    . insulin aspart (NOVOLOG) 100 UNIT/ML FlexPen Inject 0-18 Units into the skin 3 (three) times daily with meals.    . Insulin Detemir (LEVEMIR) 100 UNIT/ML Pen Inject 45 Units into the skin 2 (two) times daily at 8 am and 10 pm. (Patient taking differently: Inject 60 Units into the skin at bedtime. )    . lisinopril-hydrochlorothiazide (PRINZIDE,ZESTORETIC) 20-12.5 MG per tablet Take 1 tablet by mouth daily.    Marland Kitchen lithium 300 MG tablet Take 300 mg by mouth 2 (two) times daily.     . metoprolol succinate (TOPROL-XL) 25 MG 24 hr tablet Take 25 mg by  mouth daily.    Marland Kitchen tiotropium (SPIRIVA) 18 MCG inhalation capsule Place 18 mcg into inhaler and inhale daily.    . traZODone (DESYREL) 100 MG tablet Take 100 mg by mouth at bedtime.     No current facility-administered medications for this visit.   Allergies:  Penicillins   Social History: The patient  reports that she quit smoking about 13 months ago. Her smoking use included Cigarettes. She started smoking about 33 years ago. She has a .9 pack-year smoking history. She has never used smokeless tobacco. She reports that she does not drink alcohol or use illicit drugs.   Family History: The patient's family history includes Diabetes in her mother.   ROS:  Please see the history of present illness. Otherwise, complete review of systems is positive for NYHA class II dyspnea.  All other systems are reviewed and negative.   Physical Exam: VS:  BP 138/88 mmHg  Pulse 77  Ht 5\' 4"  (1.626 m)  Wt 277 lb (125.646 kg)  BMI 47.52 kg/m2  SpO2 95%, BMI Body mass index is 47.52 kg/(m^2).  Wt Readings from Last 3 Encounters:  05/20/15 277 lb (125.646 kg)  04/12/15 276 lb (125.193 kg)  11/26/14 277 lb (125.646 kg)    General: Morbidly obese woman, appears comfortable at rest. HEENT: Conjunctiva and lids normal, oropharynx clear. Neck: Supple, no elevated JVP or carotid bruits, no thyromegaly. Lungs: Clear to auscultation, nonlabored breathing at rest. Cardiac: Regular rate and rhythm, no S3 or significant systolic murmur, no pericardial rub. Abdomen: Soft, nontender, bowel sounds present, no guarding or rebound. Extremities: No pitting edema, distal pulses 2+. Skin: Warm and dry. Musculoskeletal: No kyphosis. Neuropsychiatric: Alert and oriented x3, affect grossly appropriate.  ECG: ECG is not ordered today.  Recent Labwork: 09/04/2014: TSH 1.524 04/12/2015: ALT 15; AST 21; B Natriuretic Peptide 78.0; BUN 18; Creatinine, Ser 1.44*; Hemoglobin 12.8; Platelets 252; Potassium 3.6; Sodium 140       Component Value Date/Time   CHOL 247* 09/04/2014 0739   TRIG 198* 09/04/2014 0739   HDL 56 09/04/2014 0739   CHOLHDL 4.4 09/04/2014 0739   VLDL 40 09/04/2014 0739   LDLCALC 151* 09/04/2014 0739    Other Studies Reviewed Today:  Chest CT 04/12/2015: FINDINGS: The pulmonary arteries are adequately opacified. There is no evidence to suggest pulmonary embolism. Some respiratory motion during the examination does compromise some small vessel and parenchymal lung detail.  The thoracic aorta is normal in caliber and shows no evidence of dissection. The heart size is within normal limits. No pleural or pericardial fluid is identified. Lungs show no evidence of airspace consolidation, pneumothorax, pulmonary edema or nodule. No airway obstruction identified. No evidence of lymphadenopathy.  Visualized upper abdominal structures show hepatic steatosis. No bony abnormalities are identified.  Review of the MIP images confirms the above  findings.  IMPRESSION: No evidence of pulmonary embolism or other acute findings in the chest. The thoracic aorta is normal in caliber and shows no evidence of dissection.  Echocardiogram 09/04/2014: Study Conclusions  - Left ventricle: The cavity size was at the upper limits of normal. Systolic function was normal. The estimated ejection fraction was in the range of 55% to 60%. Wall motion was normal; there were no regional wall motion abnormalities. Features are consistent with a pseudonormal left ventricular filling pattern, with concomitant abnormal relaxation and increased filling pressure (grade 2 diastolic dysfunction). Doppler parameters are consistent with high ventricular filling pressure. - Aortic valve: There was mild regurgitation. - Left atrium: The atrium was mildly dilated. Volume/bsa, ES, (1-plane Simpson&'s, A2C): 29.6 ml/m^2. - Atrial septum: No defect or patent foramen ovale was identified.  Carotid Dopplers  09/04/2014: IMPRESSION: No evidence of significant atherosclerotic plaque or stenosis in either carotid artery.  Vertebral arteries are patent with antegrade flow.  Renal artery Dopplers 02/11/2014: No hemodynamically significant renal artery stenosis based on limited visualization  Assessment and Plan:  1. Intermittent precordial pain over the last several months in the setting of hypertension which has been suboptimally controlled although is proving improving recently, long-standing diabetes mellitus for 26 years, previous tobacco abuse, and morbid obesity. Echocardiogram from April showed normal LVEF with grade 2 diastolic dysfunction. Cardiac enzymes and ECG from ER visit in November reviewed. Symptoms could be related to microvascular angina, however she has not had formal assessment for obstructive CAD. We will obtain a 2 day protocol Lexiscan Cardiolite on medical therapy for evaluation.  2. History of lacunar stroke in April, likely related to hypertension.  3. Essential hypertension. Previous workup for renal artery stenosis was negative. Blood pressure is much better controlled today. She reports lowering sodium in her diet, trying to walk for exercise, medications have also been adjusted.  4. LDL 151 back in April. She is on Lipitor 40 mg daily. Recommend a goal LDL around 70 with long-standing diabetes mellitus.  Current medicines were reviewed with the patient today.   Orders Placed This Encounter  Procedures  . NM Myocar Multi W/Spect W/Wall Motion / EF    Disposition: Call with results.   Signed, Satira Sark, MD, [facility] 05/20/2015 9:29 AM    [facility] [place]. [address], [place], [place] [address] Phone: [phone]; Fax: [phone]

## 2015-05-21 ENCOUNTER — Other Ambulatory Visit (HOSPITAL_COMMUNITY): Payer: Self-pay | Admitting: Nurse Practitioner

## 2015-05-21 DIAGNOSIS — R52 Pain, unspecified: Secondary | ICD-10-CM

## 2015-05-22 ENCOUNTER — Observation Stay (HOSPITAL_COMMUNITY)
Admission: EM | Admit: 2015-05-22 | Discharge: 2015-05-24 | Disposition: A | Discharge status: Home / Self Care | Payer: Medicaid Other | Attending: Internal Medicine | Admitting: Internal Medicine

## 2015-05-22 ENCOUNTER — Observation Stay (HOSPITAL_COMMUNITY): Payer: Medicaid Other

## 2015-05-22 ENCOUNTER — Emergency Department (HOSPITAL_COMMUNITY): Payer: Medicaid Other

## 2015-05-22 ENCOUNTER — Encounter (HOSPITAL_COMMUNITY): Payer: Self-pay | Admitting: Emergency Medicine

## 2015-05-22 DIAGNOSIS — E1122 Type 2 diabetes mellitus with diabetic chronic kidney disease: Secondary | ICD-10-CM | POA: Diagnosis not present

## 2015-05-22 DIAGNOSIS — Z6841 Body Mass Index (BMI) 40.0 and over, adult: Secondary | ICD-10-CM | POA: Diagnosis not present

## 2015-05-22 DIAGNOSIS — E669 Obesity, unspecified: Secondary | ICD-10-CM

## 2015-05-22 DIAGNOSIS — E785 Hyperlipidemia, unspecified: Secondary | ICD-10-CM | POA: Insufficient documentation

## 2015-05-22 DIAGNOSIS — R531 Weakness: Secondary | ICD-10-CM | POA: Diagnosis not present

## 2015-05-22 DIAGNOSIS — J449 Chronic obstructive pulmonary disease, unspecified: Secondary | ICD-10-CM | POA: Insufficient documentation

## 2015-05-22 DIAGNOSIS — R079 Chest pain, unspecified: Secondary | ICD-10-CM | POA: Diagnosis not present

## 2015-05-22 DIAGNOSIS — M79651 Pain in right thigh: Secondary | ICD-10-CM | POA: Diagnosis not present

## 2015-05-22 DIAGNOSIS — I129 Hypertensive chronic kidney disease with stage 1 through stage 4 chronic kidney disease, or unspecified chronic kidney disease: Secondary | ICD-10-CM | POA: Insufficient documentation

## 2015-05-22 DIAGNOSIS — N183 Chronic kidney disease, stage 3 unspecified: Secondary | ICD-10-CM | POA: Diagnosis present

## 2015-05-22 DIAGNOSIS — E119 Type 2 diabetes mellitus without complications: Secondary | ICD-10-CM

## 2015-05-22 DIAGNOSIS — R42 Dizziness and giddiness: Secondary | ICD-10-CM

## 2015-05-22 DIAGNOSIS — G459 Transient cerebral ischemic attack, unspecified: Secondary | ICD-10-CM | POA: Diagnosis not present

## 2015-05-22 DIAGNOSIS — Z79899 Other long term (current) drug therapy: Secondary | ICD-10-CM | POA: Insufficient documentation

## 2015-05-22 DIAGNOSIS — Z794 Long term (current) use of insulin: Secondary | ICD-10-CM | POA: Diagnosis not present

## 2015-05-22 DIAGNOSIS — Z8673 Personal history of transient ischemic attack (TIA), and cerebral infarction without residual deficits: Secondary | ICD-10-CM | POA: Insufficient documentation

## 2015-05-22 DIAGNOSIS — F319 Bipolar disorder, unspecified: Secondary | ICD-10-CM | POA: Diagnosis not present

## 2015-05-22 DIAGNOSIS — N184 Chronic kidney disease, stage 4 (severe): Secondary | ICD-10-CM | POA: Diagnosis present

## 2015-05-22 DIAGNOSIS — F4310 Post-traumatic stress disorder, unspecified: Secondary | ICD-10-CM | POA: Insufficient documentation

## 2015-05-22 DIAGNOSIS — E113593 Type 2 diabetes mellitus with proliferative diabetic retinopathy without macular edema, bilateral: Secondary | ICD-10-CM | POA: Diagnosis present

## 2015-05-22 DIAGNOSIS — R52 Pain, unspecified: Secondary | ICD-10-CM

## 2015-05-22 DIAGNOSIS — N182 Chronic kidney disease, stage 2 (mild): Secondary | ICD-10-CM

## 2015-05-22 DIAGNOSIS — I10 Essential (primary) hypertension: Secondary | ICD-10-CM | POA: Diagnosis not present

## 2015-05-22 DIAGNOSIS — G458 Other transient cerebral ischemic attacks and related syndromes: Secondary | ICD-10-CM

## 2015-05-22 DIAGNOSIS — Z7982 Long term (current) use of aspirin: Secondary | ICD-10-CM | POA: Diagnosis not present

## 2015-05-22 DIAGNOSIS — E1165 Type 2 diabetes mellitus with hyperglycemia: Secondary | ICD-10-CM | POA: Insufficient documentation

## 2015-05-22 DIAGNOSIS — E113599 Type 2 diabetes mellitus with proliferative diabetic retinopathy without macular edema, unspecified eye: Secondary | ICD-10-CM | POA: Insufficient documentation

## 2015-05-22 DIAGNOSIS — Z87891 Personal history of nicotine dependence: Secondary | ICD-10-CM | POA: Insufficient documentation

## 2015-05-22 DIAGNOSIS — I639 Cerebral infarction, unspecified: Secondary | ICD-10-CM

## 2015-05-22 DIAGNOSIS — IMO0002 Reserved for concepts with insufficient information to code with codable children: Secondary | ICD-10-CM | POA: Diagnosis present

## 2015-05-22 DIAGNOSIS — R200 Anesthesia of skin: Secondary | ICD-10-CM | POA: Diagnosis present

## 2015-05-22 DIAGNOSIS — F419 Anxiety disorder, unspecified: Secondary | ICD-10-CM | POA: Insufficient documentation

## 2015-05-22 DIAGNOSIS — IMO0001 Reserved for inherently not codable concepts without codable children: Secondary | ICD-10-CM

## 2015-05-22 LAB — COMPREHENSIVE METABOLIC PANEL
ALT: 17 U/L (ref 14–54)
AST: 21 U/L (ref 15–41)
Albumin: 1.9 g/dL — ABNORMAL LOW (ref 3.5–5.0)
Alkaline Phosphatase: 50 U/L (ref 38–126)
Anion gap: 7 (ref 5–15)
BUN: 19 mg/dL (ref 6–20)
CO2: 24 mmol/L (ref 22–32)
Calcium: 8.1 mg/dL — ABNORMAL LOW (ref 8.9–10.3)
Chloride: 105 mmol/L (ref 101–111)
Creatinine, Ser: 1.75 mg/dL — ABNORMAL HIGH (ref 0.44–1.00)
GFR calc Af Amer: 40 mL/min — ABNORMAL LOW (ref >60)
GFR calc non Af Amer: 34 mL/min — ABNORMAL LOW (ref >60)
Glucose, Bld: 310 mg/dL — ABNORMAL HIGH (ref 65–99)
Potassium: 3.6 mmol/L (ref 3.5–5.1)
Sodium: 136 mmol/L (ref 135–145)
Total Bilirubin: 0.4 mg/dL (ref 0.3–1.2)
Total Protein: 5.3 g/dL — ABNORMAL LOW (ref 6.5–8.1)

## 2015-05-22 LAB — RAPID URINE DRUG SCREEN, HOSP PERFORMED
Amphetamines: NOT DETECTED
Barbiturates: NOT DETECTED
Benzodiazepines: NOT DETECTED
Cocaine: NOT DETECTED
Opiates: NOT DETECTED
Tetrahydrocannabinol: NOT DETECTED

## 2015-05-22 LAB — DIFFERENTIAL
Basophils Absolute: 0 10*3/uL (ref 0.0–0.1)
Basophils Relative: 0 %
Eosinophils Absolute: 0.5 10*3/uL (ref 0.0–0.7)
Eosinophils Relative: 5 %
Lymphocytes Relative: 23 %
Lymphs Abs: 2.1 10*3/uL (ref 0.7–4.0)
Monocytes Absolute: 0.6 10*3/uL (ref 0.1–1.0)
Monocytes Relative: 7 %
Neutro Abs: 5.8 10*3/uL (ref 1.7–7.7)
Neutrophils Relative %: 65 %

## 2015-05-22 LAB — URINALYSIS, ROUTINE W REFLEX MICROSCOPIC
Bilirubin Urine: NEGATIVE
Glucose, UA: >1000 mg/dL — AB
Ketones, ur: NEGATIVE mg/dL
Leukocytes, UA: NEGATIVE
Nitrite: NEGATIVE
Protein, ur: >300 mg/dL — AB
Specific Gravity, Urine: 1.02 (ref 1.005–1.030)
pH: 6.5 (ref 5.0–8.0)

## 2015-05-22 LAB — CREATININE, SERUM
Creatinine, Ser: 1.7 mg/dL — ABNORMAL HIGH (ref 0.44–1.00)
GFR calc Af Amer: 41 mL/min — ABNORMAL LOW (ref >60)
GFR calc non Af Amer: 36 mL/min — ABNORMAL LOW (ref >60)

## 2015-05-22 LAB — CBC
HCT: 36.4 % (ref 36.0–46.0)
HCT: 41.9 % (ref 36.0–46.0)
Hemoglobin: 12.2 g/dL (ref 12.0–15.0)
Hemoglobin: 14 g/dL (ref 12.0–15.0)
MCH: 25.5 pg — ABNORMAL LOW (ref 26.0–34.0)
MCH: 25.5 pg — ABNORMAL LOW (ref 26.0–34.0)
MCHC: 33.5 g/dL (ref 30.0–36.0)
MCHC: 33.4 g/dL (ref 30.0–36.0)
MCV: 76 fL — ABNORMAL LOW (ref 78.0–100.0)
MCV: 76.3 fL — ABNORMAL LOW (ref 78.0–100.0)
Platelets: 278 10*3/uL (ref 150–400)
Platelets: 293 10*3/uL (ref 150–400)
RBC: 4.79 MIL/uL (ref 3.87–5.11)
RBC: 5.49 MIL/uL — ABNORMAL HIGH (ref 3.87–5.11)
RDW: 13.3 % (ref 11.5–15.5)
RDW: 13.4 % (ref 11.5–15.5)
WBC: 8.9 10*3/uL (ref 4.0–10.5)
WBC: 11.5 10*3/uL — ABNORMAL HIGH (ref 4.0–10.5)

## 2015-05-22 LAB — URINE MICROSCOPIC-ADD ON
Bacteria, UA: NONE SEEN
WBC, UA: NONE SEEN WBC/hpf (ref 0–5)

## 2015-05-22 LAB — TROPONIN I: Troponin I: 0.03 ng/mL (ref <0.031)

## 2015-05-22 LAB — APTT: aPTT: 26 seconds (ref 24–37)

## 2015-05-22 LAB — I-STAT TROPONIN, ED: Troponin i, poc: 0.02 ng/mL (ref 0.00–0.08)

## 2015-05-22 LAB — ETHANOL: Alcohol, Ethyl (B): <5 mg/dL (ref <5)

## 2015-05-22 LAB — GLUCOSE, CAPILLARY
Glucose-Capillary: 120 mg/dL — ABNORMAL HIGH (ref 65–99)
Glucose-Capillary: 326 mg/dL — ABNORMAL HIGH (ref 65–99)

## 2015-05-22 LAB — PROTIME-INR
INR: 0.97 (ref 0.00–1.49)
Prothrombin Time: 13.1 seconds (ref 11.6–15.2)

## 2015-05-22 MED ORDER — INSULIN DETEMIR 100 UNIT/ML ~~LOC~~ SOLN
60.0000 [IU] | Freq: Every day | SUBCUTANEOUS | Status: DC
  Filled 2015-05-22: qty 0.6

## 2015-05-22 MED ORDER — TRAZODONE HCL 100 MG PO TABS
100.0000 mg | ORAL_TABLET | Freq: Every day | ORAL | Status: DC
  Administered 2015-05-22 – 2015-05-23 (×2): 100 mg via ORAL
  Filled 2015-05-22 (×2): qty 1

## 2015-05-22 MED ORDER — MECLIZINE HCL 12.5 MG PO TABS
25.0000 mg | ORAL_TABLET | Freq: Once | ORAL | Status: AC
  Administered 2015-05-22: 25 mg via ORAL
  Filled 2015-05-22: qty 2

## 2015-05-22 MED ORDER — ATORVASTATIN CALCIUM 40 MG PO TABS: 40.0000 mg | ORAL_TABLET | Freq: Every day | ORAL | Status: DC

## 2015-05-22 MED ORDER — INSULIN ASPART 100 UNIT/ML ~~LOC~~ SOLN
0.0000 [IU] | Freq: Three times a day (TID) | SUBCUTANEOUS | Status: DC
  Administered 2015-05-23: 8 [IU] via SUBCUTANEOUS
  Administered 2015-05-23: 15 [IU] via SUBCUTANEOUS
  Administered 2015-05-23 – 2015-05-24 (×2): 3 [IU] via SUBCUTANEOUS
  Administered 2015-05-24: 5 [IU] via SUBCUTANEOUS

## 2015-05-22 MED ORDER — CANAGLIFLOZIN 100 MG PO TABS
100.0000 mg | ORAL_TABLET | Freq: Every day | ORAL | Status: DC
  Administered 2015-05-23: 100 mg via ORAL
  Filled 2015-05-22 (×2): qty 1

## 2015-05-22 MED ORDER — STROKE: EARLY STAGES OF RECOVERY BOOK
Freq: Once | Status: AC
  Administered 2015-05-22: 20:00:00
  Filled 2015-05-22: qty 1

## 2015-05-22 MED ORDER — PROMETHAZINE HCL 12.5 MG PO TABS
25.0000 mg | ORAL_TABLET | Freq: Once | ORAL | Status: AC
  Administered 2015-05-22: 25 mg via ORAL
  Filled 2015-05-22: qty 2

## 2015-05-22 MED ORDER — ENOXAPARIN SODIUM 40 MG/0.4ML ~~LOC~~ SOLN
40.0000 mg | SUBCUTANEOUS | Status: DC
  Administered 2015-05-22 – 2015-05-24 (×3): 40 mg via SUBCUTANEOUS
  Filled 2015-05-22 (×3): qty 0.4

## 2015-05-22 MED ORDER — INSULIN ASPART 100 UNIT/ML ~~LOC~~ SOLN
0.0000 [IU] | Freq: Every day | SUBCUTANEOUS | Status: DC
  Administered 2015-05-22: 4 [IU] via SUBCUTANEOUS

## 2015-05-22 MED ORDER — SENNOSIDES-DOCUSATE SODIUM 8.6-50 MG PO TABS: 1.0000 | ORAL_TABLET | Freq: Every evening | ORAL | Status: DC | PRN

## 2015-05-22 MED ORDER — METOPROLOL SUCCINATE ER 25 MG PO TB24
25.0000 mg | ORAL_TABLET | Freq: Every day | ORAL | Status: DC
  Administered 2015-05-23 – 2015-05-24 (×2): 25 mg via ORAL
  Filled 2015-05-22 (×2): qty 1

## 2015-05-22 MED ORDER — INSULIN ASPART 100 UNIT/ML ~~LOC~~ SOLN
4.0000 [IU] | Freq: Three times a day (TID) | SUBCUTANEOUS | Status: DC
  Administered 2015-05-23 (×2): 4 [IU] via SUBCUTANEOUS

## 2015-05-22 MED ORDER — CLONIDINE HCL 0.1 MG PO TABS
0.2000 mg | ORAL_TABLET | Freq: Every day | ORAL | Status: DC
  Administered 2015-05-23 – 2015-05-24 (×2): 0.2 mg via ORAL
  Filled 2015-05-22 (×2): qty 2

## 2015-05-22 MED ORDER — ONDANSETRON HCL 4 MG/2ML IJ SOLN
4.0000 mg | Freq: Once | INTRAMUSCULAR | Status: AC
  Administered 2015-05-22: 4 mg via INTRAVENOUS
  Filled 2015-05-22: qty 2

## 2015-05-22 MED ORDER — TIOTROPIUM BROMIDE MONOHYDRATE 18 MCG IN CAPS
18.0000 ug | ORAL_CAPSULE | Freq: Every day | RESPIRATORY_TRACT | Status: DC
  Administered 2015-05-22 – 2015-05-24 (×2): 18 ug via RESPIRATORY_TRACT
  Filled 2015-05-22: qty 5

## 2015-05-22 MED ORDER — LITHIUM CARBONATE 300 MG PO CAPS
300.0000 mg | ORAL_CAPSULE | Freq: Two times a day (BID) | ORAL | Status: DC
  Administered 2015-05-22 – 2015-05-24 (×4): 300 mg via ORAL
  Filled 2015-05-22 (×5): qty 1

## 2015-05-22 MED ORDER — HYDROCHLOROTHIAZIDE 12.5 MG PO CAPS
12.5000 mg | ORAL_CAPSULE | Freq: Every day | ORAL | Status: DC
  Administered 2015-05-23 – 2015-05-24 (×2): 12.5 mg via ORAL
  Filled 2015-05-22 (×2): qty 1

## 2015-05-22 MED ORDER — GABAPENTIN 300 MG PO CAPS: 300.0000 mg | ORAL_CAPSULE | Freq: Every evening | ORAL | Status: DC | PRN

## 2015-05-22 MED ORDER — TIOTROPIUM BROMIDE MONOHYDRATE 18 MCG IN CAPS
18.0000 ug | ORAL_CAPSULE | Freq: Every day | RESPIRATORY_TRACT | Status: DC
  Filled 2015-05-22: qty 5

## 2015-05-22 MED ORDER — CLOPIDOGREL BISULFATE 75 MG PO TABS
75.0000 mg | ORAL_TABLET | Freq: Every day | ORAL | Status: DC
  Administered 2015-05-22 – 2015-05-24 (×3): 75 mg via ORAL
  Filled 2015-05-22 (×3): qty 1

## 2015-05-22 MED ORDER — LISINOPRIL-HYDROCHLOROTHIAZIDE 20-12.5 MG PO TABS: 1.0000 | ORAL_TABLET | Freq: Every day | ORAL | Status: DC

## 2015-05-22 MED ORDER — LISINOPRIL 20 MG PO TABS
20.0000 mg | ORAL_TABLET | Freq: Every day | ORAL | Status: DC
  Administered 2015-05-23 – 2015-05-24 (×2): 20 mg via ORAL
  Filled 2015-05-22 (×2): qty 1

## 2015-05-22 MED ORDER — FLUOXETINE HCL 10 MG PO CAPS
10.0000 mg | ORAL_CAPSULE | Freq: Every day | ORAL | Status: DC
  Administered 2015-05-22 – 2015-05-24 (×3): 10 mg via ORAL
  Filled 2015-05-22 (×3): qty 1

## 2015-05-22 NOTE — ED Notes (Signed)
Pt. C/o pain that comes and goes under left breast/left rib cage area.

## 2015-05-22 NOTE — ED Provider Notes (Signed)
CSN: [id number]     Arrival date & time 05/22/15  1126 History   First MD Initiated Contact with Patient 05/22/15 1130     Chief Complaint  Patient presents with  . Numbness     (Consider location/radiation/quality/duration/timing/severity/associated sxs/prior Treatment) The history is provided by the patient.   Samantha Pierce is a 44 y.o. female with a history significant for DM, HTN and left thalamic cva (3/16) which left her with right sided upper extremity weakness, but reported now greatly improved, presenting with a one hour history of right hand numbness, difficulty speaking and right eye vision loss yesterday while at work (vision loss lasted for about 10 minutes).  These symptoms completely resolved after one hour and she spent last evening symptom free.  She woke around 3 am today, checked her cbg which was 102, and noticed dizziness, described as room spinning along with mild intermittent nausea.  She ate some cereal before returning to bed (didn't want her glucose to get lower) and when she woke her dizziness was still present.  She denies any peripheral weakness or numbness at present different from her baseline right finger numbness.  She does endorse feeling bad the entire week, stating had vomiting and diarrhea for a few days, now improved.  She describes an episode 6 days ago when she had pain in her left hand and her left calf, the hand "curled up" on her and her children had to pry her fingers open.  This symptom has now also resolved.  She endorses a mild frontal headache at present.  She has found no alleviators for her symptoms.  She also endorses increased anxiety and stress, mainly over her health issues.  She has a history of PTSD, was a 9/11 survivor, has been out of her trazadone, prozac and lithium for the past 3 months.  Is scheduled to see her mental health provider in 2 days.  Denies suicidal ideation.   Past Medical History  Diagnosis Date  . Essential  hypertension   . History of blood transfusion   . Diabetes mellitus with diabetic retinopathy   . Depression   . Anemia   . CKD (chronic kidney disease) stage 3, GFR 30-59 ml/min   . COPD (chronic obstructive pulmonary disease) ([place])   . History of stroke April 2016    Acute lacunar infarct of the left thalamus   Past Surgical History  Procedure Laterality Date  . Dilation and curettage of uterus    . Cesarean section      x2  . Tubal ligation    . Pars plana vitrectomy  04/20/2011    Procedure: PARS PLANA VITRECTOMY WITH 25 GAUGE;  Surgeon: Hayden Pedro, MD;  Location: [place];  Service: Ophthalmology;  Laterality: Left;  membrane peel, gas fluid exchange, endolaser, repair of complex retinal detachment  . Eye surgery      Right eye pars plano vitrectomy   . Lasik     Family History  Problem Relation Age of Onset  . Diabetes Mother    Social History  Substance Use Topics  . Smoking status: Former Smoker -- 0.03 packs/day for 30 years    Types: Cigarettes    Start date: 06/05/1981    Quit date: 04/04/2014  . Smokeless tobacco: Never Used  . Alcohol Use: No   OB History    Gravida Para Term Preterm AB TAB SAB Ectopic Multiple Living   2 2 2        2  Review of Systems  Constitutional: Negative for fever.  HENT: Negative for congestion and sore throat.   Eyes: Positive for visual disturbance.  Respiratory: Negative for chest tightness and shortness of breath.   Cardiovascular: Negative for chest pain.  Gastrointestinal: Positive for nausea, vomiting and diarrhea. Negative for abdominal pain.  Genitourinary: Negative.   Musculoskeletal: Negative for joint swelling, arthralgias and neck pain.  Skin: Negative.  Negative for rash and wound.  Neurological: Positive for dizziness, speech difficulty, weakness, numbness and headaches. Negative for facial asymmetry and light-headedness.  Psychiatric/Behavioral: Negative.       Allergies  Penicillins  Home  Medications   Prior to Admission medications   Medication Sig Start Date End Date Taking? Authorizing Provider  albuterol (PROVENTIL HFA;VENTOLIN HFA) 108 (90 BASE) MCG/ACT inhaler Inhale 1-2 puffs into the lungs every 6 (six) hours as needed for wheezing or shortness of breath. 06/20/14  Yes Nat Christen, MD  aspirin 325 MG tablet Take 1 tablet (325 mg total) by mouth daily. 09/05/14  Yes Rexene Alberts, MD  atorvastatin (LIPITOR) 40 MG tablet Take 1 tablet (40 mg total) by mouth daily at 6 PM. New medication to treat her high cholesterol. 09/05/14  Yes Rexene Alberts, MD  canagliflozin (INVOKANA) 100 MG TABS tablet Take 100 mg by mouth daily before breakfast.   Yes Historical Provider, MD  cloNIDine (CATAPRES) 0.2 MG tablet Take 0.2 mg by mouth daily.   Yes Historical Provider, MD  gabapentin (NEURONTIN) 300 MG capsule Take 1 capsule (300 mg total) by mouth at bedtime as needed (For numbness and tingling). 09/05/14  Yes Rexene Alberts, MD  insulin aspart (NOVOLOG) 100 UNIT/ML FlexPen Inject 0-18 Units into the skin 3 (three) times daily with meals.   Yes Historical Provider, MD  Insulin Detemir (LEVEMIR) 100 UNIT/ML Pen Inject 45 Units into the skin 2 (two) times daily at 8 am and 10 pm. Patient taking differently: Inject 60 Units into the skin at bedtime.  09/05/14  Yes Rexene Alberts, MD  lisinopril-hydrochlorothiazide (PRINZIDE,ZESTORETIC) 20-12.5 MG per tablet Take 1 tablet by mouth daily.   Yes Historical Provider, MD  metoprolol succinate (TOPROL-XL) 25 MG 24 hr tablet Take 25 mg by mouth daily.   Yes Historical Provider, MD  tiotropium (SPIRIVA) 18 MCG inhalation capsule Place 18 mcg into inhaler and inhale daily.   Yes Historical Provider, MD  FLUoxetine (PROZAC) 10 MG capsule Take 10 mg by mouth daily.    Historical Provider, MD  lithium 300 MG tablet Take 300 mg by mouth 2 (two) times daily.     Historical Provider, MD  traZODone (DESYREL) 100 MG tablet Take 100 mg by mouth at bedtime.    Historical  Provider, MD   BP 144/61 mmHg  Pulse 66  Resp 16  Ht 5\' 4"  (1.626 m)  Wt 124.739 kg  BMI 47.18 kg/m2  SpO2 98%  LMP 04/29/2015 Physical Exam  Constitutional: She appears well-developed and well-nourished.  HENT:  Head: Normocephalic and atraumatic.  Eyes: Conjunctivae are normal. Right eye exhibits nystagmus. Left eye exhibits nystagmus.  1 beat lateral nystagmus.    Neck: Normal range of motion.  Cardiovascular: Normal rate, regular rhythm, normal heart sounds and intact distal pulses.   Pulmonary/Chest: Effort normal and breath sounds normal. She has no wheezes.  Abdominal: Soft. Bowel sounds are normal. There is no tenderness.  Musculoskeletal: Normal range of motion.  Neurological: She is alert. She has normal strength. A sensory deficit is present. No cranial nerve deficit. She exhibits normal  muscle tone. Coordination normal. GCS eye subscore is 4. GCS verbal subscore is 5. GCS motor subscore is 6.  Reflex Scores:      Bicep reflexes are 2+ on the right side and 2+ on the left side. Equal grip strength. Decreased sensation to fine touch right index and thumb.  Negative pronator drift.  Normal heel shin.   Skin: Skin is warm and dry.  Psychiatric: She has a normal mood and affect.  Nursing note and vitals reviewed.   ED Course  Procedures (including critical care time) Labs Review Labs Reviewed  CBC - Abnormal; Notable for the following:    MCV 76.0 (*)    MCH 25.5 (*)    All other components within normal limits  COMPREHENSIVE METABOLIC PANEL - Abnormal; Notable for the following:    Glucose, Bld 310 (*)    Creatinine, Ser 1.75 (*)    Calcium 8.1 (*)    Total Protein 5.3 (*)    Albumin 1.9 (*)    GFR calc non Af Amer 34 (*)    GFR calc Af Amer 40 (*)    All other components within normal limits  URINALYSIS, ROUTINE W REFLEX MICROSCOPIC (NOT AT [facility]) - Abnormal; Notable for the following:    Glucose, UA >1000 (*)    Hgb urine dipstick MODERATE (*)    Protein, ur  >300 (*)    All other components within normal limits  URINE MICROSCOPIC-ADD ON - Abnormal; Notable for the following:    Squamous Epithelial / LPF 0-5 (*)    All other components within normal limits  ETHANOL  PROTIME-INR  APTT  DIFFERENTIAL  URINE RAPID DRUG SCREEN, HOSP PERFORMED  I-STAT CHEM 8, ED  I-STAT TROPOININ, ED    Imaging Review Ct Head Wo Contrast  05/22/2015  CLINICAL DATA:  Left-sided numbness and right eye visual changes EXAM: CT HEAD WITHOUT CONTRAST TECHNIQUE: Contiguous axial images were obtained from the base of the skull through the vertex without intravenous contrast. COMPARISON:  09/03/2014 FINDINGS: Bony calvarium is intact. No findings to suggest acute hemorrhage, acute infarction or space-occupying mass lesion are noted. IMPRESSION: No acute abnormality noted. Electronically Signed   By: Inez Catalina M.D.   On: 05/22/2015 12:40   I have personally reviewed and evaluated these images and lab results as part of my medical decision-making.   EKG Interpretation   Date/Time:  Saturday May 22 2015 11:35:17 EST Ventricular Rate:  78 PR Interval:  180 QRS Duration: 89 QT Interval:  370 QTC Calculation: 421 R Axis:   24 Text Interpretation:  Sinus rhythm ST elev, probable normal early repol  pattern Baseline wander in lead(s) V1 V6 Artifact Confirmed by Wyvonnia Dusky   MD, [place] [phone]) on 05/22/2015 12:22:26 PM      MDM   Final diagnoses:  Other specified transient cerebral ischemias  Vertigo    Pt with history of cva with resolved episode yesterday of right hand weakness, slurred speech and vision change suggesting tia, now with vertigo and nausea.  Given history will need admission to r/o new cva/central source of vertigo.  Labs and CT today ok, hyperglycemia without acidosis.  Renal insufficiency. Pt was given IV fluids, meclizine, phenergan in ed.    Pt seen by Dr Wyvonnia Dusky during this ed visit.  Discussed with Dr. Jerilee Hoh with [facility]. Pt will need to be transferred to [facility] as there is no MRI available here the rest of the weekend and she will arrange this.  [place]  completed.    Evalee Jefferson, PA-C 05/22/15 [place], MD 05/22/15 [phone]

## 2015-05-22 NOTE — ED Notes (Signed)
Patient reports having a sudden episode of numbness on left side with difficulty in speaking and visual change in right eye lasting approx 10 minutes. Per patient had a TIA in March with numbness in right side that is improving. Per patient sypmtoms have resided but now patient is having difficulty in balance and reports dizziness.

## 2015-05-22 NOTE — H&P (Signed)
[facility]          History and Physical    PCP:   Carlynn Spry, NP   EDP: Evalee Jefferson, PA  Chief Complaint:  Right sided weakness, temporary loss of vision out of the right eye  HPI: Patient is a 44 year old woman with history of a left MCA CVA in March 2016, diabetes with diabetic retinopathy, hypertension and stage II chronic kidney disease who presents with the above complaints. She works at a daycare center and relates that yesterday she had an overwhelming loss of vision out of her right eye that lasted for about 15 minutes, at the same time she noticed severe numbness over the right side of her face and right arm. EMS was called to her place of work, however she refused transport as she did not have care for her young children. She presents to the hospital today. At this point most of her symptoms are resolved with the exception of remaining vertigo. She is hemodynamically stable, renal function is at baseline, CT scan of the head without acute abnormality. Will be transferred to [facility] for an observation admission for a TIA workup given lack of MRI at [facility] over the weekend.  Allergies:   Allergies  Allergen Reactions  . Penicillins Rash    Has patient had a PCN reaction causing immediate rash, facial/tongue/throat swelling, SOB or lightheadedness with hypotension: Yes Has patient had a PCN reaction causing severe rash involving mucus membranes or skin necrosis: No Has patient had a PCN reaction that required hospitalization No Has patient had a PCN reaction occurring within the last 10 years: No If all of the above answers are "NO", then may proceed with Cephalosporin use.       Past Medical History  Diagnosis Date  . Essential hypertension   . History of blood transfusion   . Diabetes mellitus with diabetic retinopathy   . Depression   . Anemia   . CKD (chronic kidney disease) stage 3, GFR 30-59 ml/min   . COPD (chronic obstructive  pulmonary disease) ([place])   . History of stroke April 2016    Acute lacunar infarct of the left thalamus    Past Surgical History  Procedure Laterality Date  . Dilation and curettage of uterus    . Cesarean section      x2  . Tubal ligation    . Pars plana vitrectomy  04/20/2011    Procedure: PARS PLANA VITRECTOMY WITH 25 GAUGE;  Surgeon: Hayden Pedro, MD;  Location: [place];  Service: Ophthalmology;  Laterality: Left;  membrane peel, gas fluid exchange, endolaser, repair of complex retinal detachment  . Eye surgery      Right eye pars plano vitrectomy   . Lasik      Prior to Admission medications   Medication Sig Start Date End Date Taking? Authorizing Provider  albuterol (PROVENTIL HFA;VENTOLIN HFA) 108 (90 BASE) MCG/ACT inhaler Inhale 1-2 puffs into the lungs every 6 (six) hours as needed for wheezing or shortness of breath. 06/20/14  Yes Nat Christen, MD  aspirin 325 MG tablet Take 1 tablet (325 mg total) by mouth daily. 09/05/14  Yes Rexene Alberts, MD  atorvastatin (LIPITOR) 40 MG tablet Take 1 tablet (40 mg total) by mouth daily at 6 PM. New medication to treat her high cholesterol. 09/05/14  Yes Rexene Alberts, MD  canagliflozin (INVOKANA) 100 MG TABS tablet Take 100 mg by  mouth daily before breakfast.   Yes Historical Provider, MD  cloNIDine (CATAPRES) 0.2 MG tablet Take 0.2 mg by mouth daily.   Yes Historical Provider, MD  gabapentin (NEURONTIN) 300 MG capsule Take 1 capsule (300 mg total) by mouth at bedtime as needed (For numbness and tingling). 09/05/14  Yes Rexene Alberts, MD  insulin aspart (NOVOLOG) 100 UNIT/ML FlexPen Inject 0-18 Units into the skin 3 (three) times daily with meals.   Yes Historical Provider, MD  Insulin Detemir (LEVEMIR) 100 UNIT/ML Pen Inject 45 Units into the skin 2 (two) times daily at 8 am and 10 pm. Patient taking differently: Inject 60 Units into the skin at bedtime.  09/05/14  Yes Rexene Alberts, MD  lisinopril-hydrochlorothiazide (PRINZIDE,ZESTORETIC) 20-12.5  MG per tablet Take 1 tablet by mouth daily.   Yes Historical Provider, MD  metoprolol succinate (TOPROL-XL) 25 MG 24 hr tablet Take 25 mg by mouth daily.   Yes Historical Provider, MD  tiotropium (SPIRIVA) 18 MCG inhalation capsule Place 18 mcg into inhaler and inhale daily.   Yes Historical Provider, MD  FLUoxetine (PROZAC) 10 MG capsule Take 10 mg by mouth daily.    Historical Provider, MD  lithium 300 MG tablet Take 300 mg by mouth 2 (two) times daily.     Historical Provider, MD  traZODone (DESYREL) 100 MG tablet Take 100 mg by mouth at bedtime.    Historical Provider, MD    Social History:  reports that she quit smoking about 13 months ago. Her smoking use included Cigarettes. She started smoking about 33 years ago. She has a .9 pack-year smoking history. She has never used smokeless tobacco. She reports that she does not drink alcohol or use illicit drugs.  Family History  Problem Relation Age of Onset  . Diabetes Mother     Review of Systems:  Constitutional: Denies fever, chills, diaphoresis, appetite change and fatigue.  HEENT: Denies photophobia, eye pain, redness, hearing loss, ear pain, congestion, sore throat, rhinorrhea, sneezing, mouth sores, trouble swallowing, neck pain, neck stiffness and tinnitus.   Respiratory: Denies SOB, DOE, cough, chest tightness,  and wheezing.   Cardiovascular: Denies chest pain, palpitations and leg swelling.  Gastrointestinal: Denies nausea, vomiting, abdominal pain, diarrhea, constipation, blood in stool and abdominal distention.  Genitourinary: Denies dysuria, urgency, frequency, hematuria, flank pain and difficulty urinating.  Endocrine: Denies: hot or cold intolerance, sweats, changes in hair or nails, polyuria, polydipsia. Musculoskeletal: Denies myalgias, back pain, joint swelling, arthralgias and gait problem.  Skin: Denies pallor, rash and wound.  Neurological: Denies  seizures, syncope, and headaches.  Hematological: Denies adenopathy.  Easy bruising, personal or family bleeding history  Psychiatric/Behavioral: Denies suicidal ideation, mood changes, confusion, nervousness, sleep disturbance and agitation   Physical Exam: Blood pressure 174/83, pulse 59, resp. rate 16, height '5\' 4"'$  (1.626 m), weight 124.739 kg (275 lb), last menstrual period 04/29/2015, SpO2 100 %. General: Alert, awake, oriented 3 HEENT: Normocephalic, atraumatic, pupils are unreactive to light, extraocular movements intact Neck: Supple, no JVD, no lymphadenopathy, no bruits, no goiter. Cardiovascular: Regular rate and rhythm, no murmurs, rubs or gallops lungs: Clear to auscultation bilaterally Abdomen: Soft, nontender, nondistended, positive bowel sounds, no masses or organomegaly noted. Next and extremities: 1+ pitting edema bilaterally Neurologic: Remaining weakness over right upper extremity which per patient is residual from prior CVA  Labs on Admission:  Results for orders placed or performed during the hospital encounter of 05/22/15 (from the past 48 hour(s))  Ethanol     Status: None  Collection Time: 05/22/15 12:09 PM  Result Value Ref Range   Alcohol, Ethyl (B) <5 <5 mg/dL    Comment:        LOWEST DETECTABLE LIMIT FOR SERUM ALCOHOL IS 5 mg/dL FOR MEDICAL PURPOSES ONLY   Protime-INR     Status: None   Collection Time: 05/22/15 12:09 PM  Result Value Ref Range   Prothrombin Time 13.1 11.6 - 15.2 seconds   INR 0.97 0.00 - 1.49  APTT     Status: None   Collection Time: 05/22/15 12:09 PM  Result Value Ref Range   aPTT 26 24 - 37 seconds  CBC     Status: Abnormal   Collection Time: 05/22/15 12:09 PM  Result Value Ref Range   WBC 8.9 4.0 - 10.5 K/uL   RBC 4.79 3.87 - 5.11 MIL/uL   Hemoglobin 12.2 12.0 - 15.0 g/dL   HCT 36.4 36.0 - 46.0 %   MCV 76.0 (L) 78.0 - 100.0 fL   MCH 25.5 (L) 26.0 - 34.0 pg   MCHC 33.5 30.0 - 36.0 g/dL   RDW 13.3 11.5 - 15.5 %   Platelets 278 150 - 400 K/uL  Differential     Status: None   Collection Time:  05/22/15 12:09 PM  Result Value Ref Range   Neutrophils Relative % 65 %   Neutro Abs 5.8 1.7 - 7.7 K/uL   Lymphocytes Relative 23 %   Lymphs Abs 2.1 0.7 - 4.0 K/uL   Monocytes Relative 7 %   Monocytes Absolute 0.6 0.1 - 1.0 K/uL   Eosinophils Relative 5 %   Eosinophils Absolute 0.5 0.0 - 0.7 K/uL   Basophils Relative 0 %   Basophils Absolute 0.0 0.0 - 0.1 K/uL  Comprehensive metabolic panel     Status: Abnormal   Collection Time: 05/22/15 12:09 PM  Result Value Ref Range   Sodium 136 135 - 145 mmol/L   Potassium 3.6 3.5 - 5.1 mmol/L   Chloride 105 101 - 111 mmol/L   CO2 24 22 - 32 mmol/L   Glucose, Bld 310 (H) 65 - 99 mg/dL   BUN 19 6 - 20 mg/dL   Creatinine, Ser 1.75 (H) 0.44 - 1.00 mg/dL   Calcium 8.1 (L) 8.9 - 10.3 mg/dL   Total Protein 5.3 (L) 6.5 - 8.1 g/dL   Albumin 1.9 (L) 3.5 - 5.0 g/dL   AST 21 15 - 41 U/L   ALT 17 14 - 54 U/L   Alkaline Phosphatase 50 38 - 126 U/L   Total Bilirubin 0.4 0.3 - 1.2 mg/dL   GFR calc non Af Amer 34 (L) >60 mL/min   GFR calc Af Amer 40 (L) >60 mL/min    Comment: (NOTE) The eGFR has been calculated using the CKD EPI equation. This calculation has not been validated in all clinical situations. eGFR's persistently <60 mL/min signify possible Chronic Kidney Disease.    Anion gap 7 5 - 15  Urine rapid drug screen (hosp performed)not at [facility]     Status: None   Collection Time: 05/22/15 12:45 PM  Result Value Ref Range   Opiates NONE DETECTED NONE DETECTED   Cocaine NONE DETECTED NONE DETECTED   Benzodiazepines NONE DETECTED NONE DETECTED   Amphetamines NONE DETECTED NONE DETECTED   Tetrahydrocannabinol NONE DETECTED NONE DETECTED   Barbiturates NONE DETECTED NONE DETECTED    Comment:        DRUG SCREEN FOR MEDICAL PURPOSES ONLY.  IF CONFIRMATION IS NEEDED FOR ANY PURPOSE,  NOTIFY LAB WITHIN 5 DAYS.        LOWEST DETECTABLE LIMITS FOR URINE DRUG SCREEN Drug Class       Cutoff (ng/mL) Amphetamine      1000 Barbiturate       200 Benzodiazepine   741 Tricyclics       287 Opiates          300 Cocaine          300 THC              50   Urinalysis, Routine w reflex microscopic (not at [facility])     Status: Abnormal   Collection Time: 05/22/15 12:45 PM  Result Value Ref Range   Color, Urine YELLOW YELLOW   APPearance CLEAR CLEAR   Specific Gravity, Urine 1.020 1.005 - 1.030   pH 6.5 5.0 - 8.0   Glucose, UA >1000 (A) NEGATIVE mg/dL   Hgb urine dipstick MODERATE (A) NEGATIVE   Bilirubin Urine NEGATIVE NEGATIVE   Ketones, ur NEGATIVE NEGATIVE mg/dL   Protein, ur >300 (A) NEGATIVE mg/dL   Nitrite NEGATIVE NEGATIVE   Leukocytes, UA NEGATIVE NEGATIVE  Urine microscopic-add on     Status: Abnormal   Collection Time: 05/22/15 12:45 PM  Result Value Ref Range   Squamous Epithelial / LPF 0-5 (A) NONE SEEN   WBC, UA NONE SEEN 0 - 5 WBC/hpf   RBC / HPF 0-5 0 - 5 RBC/hpf   Bacteria, UA NONE SEEN NONE SEEN    Radiological Exams on Admission: Ct Head Wo Contrast  05/22/2015  CLINICAL DATA:  Left-sided numbness and right eye visual changes EXAM: CT HEAD WITHOUT CONTRAST TECHNIQUE: Contiguous axial images were obtained from the base of the skull through the vertex without intravenous contrast. COMPARISON:  09/03/2014 FINDINGS: Bony calvarium is intact. No findings to suggest acute hemorrhage, acute infarction or space-occupying mass lesion are noted. IMPRESSION: No acute abnormality noted. Electronically Signed   By: Inez Catalina M.D.   On: 05/22/2015 12:40    Assessment/Plan Principal Problem:   TIA (transient ischemic attack) Active Problems:   Proliferative diabetic retinopathy of both eyes ([place])   Essential hypertension   Diabetes mellitus with diabetic retinopathy   Chronic kidney disease, stage II (mild)   Obesity   CVA (cerebral infarction)   TIA -This is the most likely etiology for her symptoms. -Will undergo stroke workup including MRI, 2-D echo, carotid Dopplers. -We'll upgrade aspirin to Plavix for  secondary stroke prevention. -Check PT/OT consults.  Hypertension -Continue home medications, all ulcer degree of permissive hypertension given potential CNS event.  Insulin-dependent diabetes mellitus -Check hemoglobin A1c, place on sliding scale insulin and continue home dose of Levemir.  Morbid obesity -Noted  Proliferative diabetic retinopathy -With episode of loss of vision, would consider follow-up with ophthalmology following discharge.  DVT prophylaxis -Lovenox  CODE STATUS -Full code    Time Spent on Admission: 75 minutes  HERNANDEZ ACOSTA,ESTELA [facility] Pager: [phone] 05/22/2015, 4:27 PM

## 2015-05-22 NOTE — ED Notes (Signed)
EMS en route to transfer pt to [place]. Report called to Manus Gunning, Therapist, sports.

## 2015-05-23 ENCOUNTER — Observation Stay (HOSPITAL_BASED_OUTPATIENT_CLINIC_OR_DEPARTMENT_OTHER): Payer: Medicaid Other

## 2015-05-23 DIAGNOSIS — I10 Essential (primary) hypertension: Secondary | ICD-10-CM | POA: Diagnosis not present

## 2015-05-23 DIAGNOSIS — G451 Carotid artery syndrome (hemispheric): Secondary | ICD-10-CM

## 2015-05-23 DIAGNOSIS — E669 Obesity, unspecified: Secondary | ICD-10-CM | POA: Diagnosis not present

## 2015-05-23 DIAGNOSIS — N179 Acute kidney failure, unspecified: Secondary | ICD-10-CM | POA: Diagnosis not present

## 2015-05-23 DIAGNOSIS — R52 Pain, unspecified: Secondary | ICD-10-CM

## 2015-05-23 DIAGNOSIS — I639 Cerebral infarction, unspecified: Secondary | ICD-10-CM | POA: Diagnosis not present

## 2015-05-23 DIAGNOSIS — G459 Transient cerebral ischemic attack, unspecified: Secondary | ICD-10-CM

## 2015-05-23 DIAGNOSIS — N183 Chronic kidney disease, stage 3 (moderate): Secondary | ICD-10-CM

## 2015-05-23 LAB — BASIC METABOLIC PANEL
Anion gap: 7 (ref 5–15)
BUN: 17 mg/dL (ref 6–20)
CO2: 22 mmol/L (ref 22–32)
Calcium: 8 mg/dL — ABNORMAL LOW (ref 8.9–10.3)
Chloride: 102 mmol/L (ref 101–111)
Creatinine, Ser: 1.9 mg/dL — ABNORMAL HIGH (ref 0.44–1.00)
GFR calc Af Amer: 36 mL/min — ABNORMAL LOW (ref >60)
GFR calc non Af Amer: 31 mL/min — ABNORMAL LOW (ref >60)
Glucose, Bld: 388 mg/dL — ABNORMAL HIGH (ref 65–99)
Potassium: 4 mmol/L (ref 3.5–5.1)
Sodium: 131 mmol/L — ABNORMAL LOW (ref 135–145)

## 2015-05-23 LAB — GLUCOSE, CAPILLARY
Glucose-Capillary: 359 mg/dL — ABNORMAL HIGH (ref 65–99)
Glucose-Capillary: 292 mg/dL — ABNORMAL HIGH (ref 65–99)
Glucose-Capillary: 191 mg/dL — ABNORMAL HIGH (ref 65–99)
Glucose-Capillary: 190 mg/dL — ABNORMAL HIGH (ref 65–99)

## 2015-05-23 LAB — I-STAT CHEM 8, ED
BUN: 17 mg/dL (ref 6–20)
Calcium, Ion: 1.13 mmol/L (ref 1.12–1.23)
Chloride: 103 mmol/L (ref 101–111)
Creatinine, Ser: 1.6 mg/dL — ABNORMAL HIGH (ref 0.44–1.00)
Glucose, Bld: 305 mg/dL — ABNORMAL HIGH (ref 65–99)
HCT: 37 % (ref 36.0–46.0)
Hemoglobin: 12.6 g/dL (ref 12.0–15.0)
Potassium: 3.6 mmol/L (ref 3.5–5.1)
Sodium: 137 mmol/L (ref 135–145)
TCO2: 23 mmol/L (ref 0–100)

## 2015-05-23 LAB — LIPID PANEL
Cholesterol: 245 mg/dL — ABNORMAL HIGH (ref 0–200)
HDL: 45 mg/dL (ref >40)
LDL Cholesterol: 144 mg/dL — ABNORMAL HIGH (ref 0–99)
Total CHOL/HDL Ratio: 5.4 RATIO
Triglycerides: 278 mg/dL — ABNORMAL HIGH (ref <150)
VLDL: 56 mg/dL — ABNORMAL HIGH (ref 0–40)

## 2015-05-23 LAB — LITHIUM LEVEL: Lithium Lvl: 0.22 mmol/L — ABNORMAL LOW (ref 0.60–1.20)

## 2015-05-23 MED ORDER — ATORVASTATIN CALCIUM 80 MG PO TABS
80.0000 mg | ORAL_TABLET | Freq: Every day | ORAL | Status: DC
Start: 2015-05-23 — End: 2015-05-24
  Administered 2015-05-23 – 2015-05-24 (×2): 80 mg via ORAL
  Filled 2015-05-23 (×2): qty 1

## 2015-05-23 MED ORDER — INSULIN DETEMIR 100 UNIT/ML ~~LOC~~ SOLN
60.0000 [IU] | Freq: Every day | SUBCUTANEOUS | Status: DC
  Administered 2015-05-23: 60 [IU] via SUBCUTANEOUS
  Filled 2015-05-23 (×2): qty 0.6

## 2015-05-23 MED ORDER — ACETAMINOPHEN 325 MG PO TABS
650.0000 mg | ORAL_TABLET | Freq: Four times a day (QID) | ORAL | Status: DC | PRN
  Administered 2015-05-23: 650 mg via ORAL
  Filled 2015-05-23: qty 2

## 2015-05-23 MED ORDER — SODIUM CHLORIDE 0.9 % IV SOLN
INTRAVENOUS | Status: DC
  Administered 2015-05-23: 17:00:00 via INTRAVENOUS

## 2015-05-23 MED ORDER — INSULIN ASPART 100 UNIT/ML ~~LOC~~ SOLN
6.0000 [IU] | Freq: Three times a day (TID) | SUBCUTANEOUS | Status: DC
  Administered 2015-05-23 – 2015-05-24 (×4): 6 [IU] via SUBCUTANEOUS

## 2015-05-23 NOTE — Progress Notes (Signed)
VASCULAR LAB PRELIMINARY  PRELIMINARY  PRELIMINARY  PRELIMINARY  Bilateral lower extremity venous duplex completed.    Preliminary report:  There is no DVT or SVT noted in the bilateral lower extremities.   Keondra Haydu, RVT 05/23/2015, 11:42 AM

## 2015-05-23 NOTE — Evaluation (Signed)
Physical Therapy Evaluation Patient Details Name: Samantha Pierce MRN: [id number] DOB: 1970/06/15 Today's Date: 05/23/2015   History of Present Illness  Pt is a 44 y/o female with a PMH significant for L MCA CVA in 08/2014, DM with diabetic retinopathy, HTN, CKD. Pt presents to [facility] for loss of vision out of R eye that lasted ~15 minutes and severe numbness over right side of face and R arm the day before while she was working. 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 CT and MRI negative for acute findings.   Clinical Impression  Pt admitted with above diagnosis. Pt currently with functional limitations due to the deficits listed below (see PT Problem List). At the time of PT eval pt was able to perform transfers and ambulation with close guard without an AD, to supervision with a SPC. Pt reports pain on lateral R thigh at end of session, and ice pack was provided. Pt will benefit from skilled PT to increase their independence and safety with mobility to allow discharge to the venue listed below.       Follow Up Recommendations Outpatient PT;Supervision for mobility/OOB    Equipment Recommendations  Cane    Recommendations for Other Services       Precautions / Restrictions Precautions Precautions: Fall Precaution Comments: Residual balance deficits from prior stroke.  Restrictions Weight Bearing Restrictions: No      Mobility  Bed Mobility               General bed mobility comments: Pt sitting up on EOB when PT arrived.   Transfers Overall transfer level: Needs assistance Equipment used: None Transfers: Sit to/from Stand Sit to Stand: Supervision         General transfer comment: Supervision for safety. Pt initially with HHA to stand but did not require any assistance. Later in session pt was at a supervision level.   Ambulation/Gait Ambulation/Gait assistance: Min guard;Supervision Ambulation  Distance (Feet): 400 Feet Assistive device: None;Straight cane Gait Pattern/deviations: Step-through pattern;Decreased stride length;Trendelenburg;Wide base of support Gait velocity: Decreased Gait velocity interpretation: Below normal speed for age/gender General Gait Details: Pt initially without AD and required min guard for safety. Appeared unsteady and pt reaching for rails in hall for support. With [facility], pt at a supervision level and appears more comfortable and confident with gait training.   Stairs Stairs: Yes Stairs assistance: Supervision Stair Management: One rail Left;With cane;Step to pattern Number of Stairs: 2 General stair comments: VC's for sequencing and safety  Wheelchair Mobility    Modified Rankin (Stroke Patients Only)       Balance Overall balance assessment: Needs assistance Sitting-balance support: Feet supported;No upper extremity supported Sitting balance-Leahy Scale: Fair     Standing balance support: No upper extremity supported;During functional activity Standing balance-Leahy Scale: Poor Standing balance comment: Close guard for safety for dynamic balance                             Pertinent Vitals/Pain Pain Assessment: No/denies pain    Home Living Family/patient expects to be discharged to:: Private residence Living Arrangements: Children Available Help at Discharge: Family;Available PRN/intermittently Type of Home: Apartment Home Access: Stairs to enter Entrance Stairs-Rails: None Entrance Stairs-Number of Steps: 1 Home Layout: One level Home Equipment: Grab bars - tub/shower      Prior Function Level of Independence: Independent  Hand Dominance   Dominant Hand: Left    Extremity/Trunk Assessment   Upper Extremity Assessment: Defer to OT evaluation           Lower Extremity Assessment: RLE deficits/detail RLE Deficits / Details: Slight residual weakness from prior stroke.    Cervical /  Trunk Assessment: Other exceptions  Communication   Communication: No difficulties  Cognition Arousal/Alertness: Awake/alert Behavior During Therapy: WFL for tasks assessed/performed Overall Cognitive Status: Within Functional Limits for tasks assessed                      General Comments      Exercises        Assessment/Plan    PT Assessment Patient needs continued PT services  PT Diagnosis Difficulty walking   PT Problem List Decreased strength;Decreased range of motion;Decreased activity tolerance;Decreased balance;Decreased mobility;Decreased knowledge of use of DME;Decreased safety awareness;Decreased knowledge of precautions  PT Treatment Interventions DME instruction;Gait training;Stair training;Functional mobility training;Therapeutic activities;Therapeutic exercise;Neuromuscular re-education;Patient/family education   PT Goals (Current goals can be found in the Care Plan section) Acute Rehab PT Goals Patient Stated Goal: Home today PT Goal Formulation: With patient Time For Goal Achievement: 05/30/15 Potential to Achieve Goals: Good    Frequency Min 3X/week   Barriers to discharge        Co-evaluation               End of Session Equipment Utilized During Treatment: Gait belt Activity Tolerance: Patient tolerated treatment well Patient left: in chair;with call bell/phone within reach Nurse Communication: Mobility status    Functional Assessment Tool Used: Clinical judgement Functional Limitation: Mobility: Walking and moving around Mobility: Walking and Moving Around Current Status [id number]): At least 1 percent but less than 20 percent impaired, limited or restricted Mobility: Walking and Moving Around Goal Status [phone]): At least 1 percent but less than 20 percent impaired, limited or restricted    Time: 0803-0833 PT Time Calculation (min) (ACUTE ONLY): 30 min   Charges:   PT Evaluation $Initial PT Evaluation Tier I: 1 Procedure PT  Treatments $Gait Training: 8-22 mins   PT G Codes:   PT G-Codes **NOT FOR INPATIENT CLASS** Functional Assessment Tool Used: Clinical judgement Functional Limitation: Mobility: Walking and moving around Mobility: Walking and Moving Around Current Status [id number]): At least 1 percent but less than 20 percent impaired, limited or restricted Mobility: Walking and Moving Around Goal Status [phone]): At least 1 percent but less than 20 percent impaired, limited or restricted    Rolinda Roan 05/23/2015, 10:54 AM  Rolinda Roan, PT, DPT Acute Rehabilitation Services Pager: [phone]

## 2015-05-23 NOTE — Progress Notes (Addendum)
PROGRESS NOTE    Samantha Pierce [id number] DOB: 1970-11-21 DOA: 05/22/2015 PCP: Carlynn Spry, NP  HPI/Brief narrative 44 year old female patient with history of left MCA CVA March 2016, diabetes with retinopathy, HTN, recently diagnosed COPD, stage III chronic kidney disease, awaiting nuclear stress test on 12/27 for evaluation of chest pain, works in daycare, presented to [facility] on 05/22/15 with transient right eye blindness which occurred on 12/16 at 12:15 PM. This was associated with right upper extremity numbness, dysarthria and speech abnormality. Symptoms resolved in 15 minutes. EMS came by but she declined coming to the hospital that day and presented the subsequent day. CT head negative for acute abnormality. Transferred to [facility] for MRI brain and stroke workup.   Assessment/Plan:   TIA- likely left cortical TIA - MRI brain: No acute intracranial infarct. - MRA brain: Nonvisualization of the right vertebral artery which may be occluded in the neck. Dominant left vertebral artery widely patent. Multi-focal moderate to severe stenosis within the proximal and mid right PCA. Suspected fetal origin of the left PCA. No definite stenosis within the anterior circulation. - Carotid Dopplers:  1-39 percent ICA plaquing. Vertebral artery flow is antegrade. - 2-D echo: LVEF 60-65 percent and grade 1 diastolic dysfunction. Trivial pericardial effusion.  -  LDL 144 - A1c: Pending -  patient was on aspirin prior to admission. Now on Plavix per neurology (deferring DAPT to Stroke MD in am) - Therapies: Outpatient PT, supervision for mobility/OOB, Cane  Essential hypertension  - Allow for permissive hypertension   Hyperlipidemia - LDL 144, goal <70. Was on atorvastatin 40 mg daily, will increase to 80 mg daily.   Uncontrolled type II DM/IDDM with renal complications  - Currently on Levemir, NovoLog mealtime and sliding scale insulin. Will need to adjust for better  control.  Right thigh pain - Possibly muscular sprain. Lower extremity venous Doppler negative. Apart from mild focal tenderness, no acute findings on exam. Symptomatic treatment and monitor.  Diabetic retinopathy  - Recommend outpatient follow-up with ophthalmology   Acute on stage III chronic kidney disease - Baseline creatinine may be in the 1.4 range. Since arrival to, creatinine has gradually increased from 1.6-1.9. Brief IV fluids and follow BMP in a.m.  COPD - Stable. Former smoker.  Intermittent chest pain - Evaluated by cardiology on 05/20/15 and patient states that she has appointment for nuclear stress test on 12/27.  Morbid obesity.   DVT prophylaxis: Lovenox Code Status: full  Family Communication: None at bedside  Disposition Plan: DC home possibly 12/19   Consultants:  Neurology  Procedures:  2-D echo 05/23/15: Study Conclusions  - Left ventricle: The cavity size was normal. There was moderate concentric hypertrophy. Systolic function was normal. The estimated ejection fraction was in the range of 60% to 65%. Wall motion was normal; there were no regional wall motion abnormalities. Doppler parameters are consistent with abnormal left ventricular relaxation (grade 1 diastolic dysfunction). Doppler parameters are consistent with elevated ventricular end-diastolic filling pressure. - Aortic valve: Trileaflet; normal thickness leaflets. There was mild regurgitation. - Aortic root: The aortic root was normal in size. - Mitral valve: Structurally normal valve. There was trivial regurgitation. - Right ventricle: The cavity size was normal. Wall thickness was normal. Systolic function was normal. - Right atrium: The atrium was normal in size. - Tricuspid valve: Structurally normal valve. There was trivial regurgitation. - Pulmonic valve: There was trivial regurgitation. - Inferior vena cava: The vessel was normal in size. -  Pericardium,  extracardiac: A trivial pericardial effusion was identified posterior to the heart. Features were not consistent with tamponade physiology.  Antibiotics:  None  Subjective: No recurrence of visual abnormality or right upper extremity numbness. Complaints of right mid lateral thigh pain-denies trauma or fall. No recent long distance travel.  Objective: Filed Vitals:   05/22/15 2300 05/23/15 0200 05/23/15 0400 05/23/15 0600  BP: 139/64 171/84 157/79 154/78  Pulse: 66 83 68 64  Temp: 98 F (36.7 C) 98 F (36.7 C) 98.4 F (36.9 C) 98 F (36.7 C)  TempSrc: Oral Oral Oral Oral  Resp: 18 18 18 16   Height:      Weight:      SpO2: 98% 98% 97% 96%   No intake or output data in the 24 hours ending 05/23/15 0727 Filed Weights   05/22/15 1137  Weight: 124.739 kg (275 lb)     Exam: Patient examined with her female RN in room as chaperone.  General exam: Pleasant young female, moderately built and morbidly obese, sitting up comfortably in chair this morning.  Respiratory system: Clear. No increased work of breathing. Cardiovascular system: S1 & S2 heard, RRR. No JVD, murmurs, gallops, clicks or pedal edema.Telemetry: SR. Gastrointestinal system: Abdomen is nondistended, soft and nontender. Normal bowel sounds heard. Central nervous system: Alert and oriented. No focal neurological deficits. Extremities: Symmetric 5 x 5 power. Right mid lateral thigh with mild focal tenderness without any other acute signs.   Data Reviewed: Basic Metabolic Panel:  Recent Labs Lab 05/22/15 1209 05/22/15 1224 05/22/15 1859  NA 136 137  --   K 3.6 3.6  --   CL 105 103  --   CO2 24  --   --   GLUCOSE 310* 305*  --   BUN 19 17  --   CREATININE 1.75* 1.60* 1.70*  CALCIUM 8.1*  --   --    Liver Function Tests:  Recent Labs Lab 05/22/15 1209  AST 21  ALT 17  ALKPHOS 50  BILITOT 0.4  PROT 5.3*  ALBUMIN 1.9*   No results for input(s): LIPASE, AMYLASE in the last 168  hours. No results for input(s): AMMONIA in the last 168 hours. CBC:  Recent Labs Lab 05/22/15 1209 05/22/15 1224 05/22/15 1859  WBC 8.9  --  11.5*  NEUTROABS 5.8  --   --   HGB 12.2 12.6 14.0  HCT 36.4 37.0 41.9  MCV 76.0*  --  76.3*  PLT 278  --  293   Cardiac Enzymes:  Recent Labs Lab 05/22/15 1626  TROPONINI 0.03   BNP (last 3 results) No results for input(s): PROBNP in the last 8760 hours. CBG:  Recent Labs Lab 05/22/15 1746 05/22/15 2206 05/23/15 0625  GLUCAP 120* 326* 359*    No results found for this or any previous visit (from the past 240 hour(s)).        Studies: Ct Head Wo Contrast  05/22/2015  CLINICAL DATA:  Left-sided numbness and right eye visual changes EXAM: CT HEAD WITHOUT CONTRAST TECHNIQUE: Contiguous axial images were obtained from the base of the skull through the vertex without intravenous contrast. COMPARISON:  09/03/2014 FINDINGS: Bony calvarium is intact. No findings to suggest acute hemorrhage, acute infarction or space-occupying mass lesion are noted. IMPRESSION: No acute abnormality noted. Electronically Signed   By: Inez Catalina M.D.   On: 05/22/2015 12:40   Mr Brain Wo Contrast  05/22/2015  CLINICAL DATA:  Initial evaluation for right-sided visual loss with right-sided numbness. EXAM:  MRI HEAD WITHOUT CONTRAST MRA HEAD WITHOUT CONTRAST TECHNIQUE: Multiplanar, multiecho pulse sequences of the brain and surrounding structures were obtained without intravenous contrast. Angiographic images of the head were obtained using MRA technique without contrast. COMPARISON:  Prior CT from earlier the same day. FINDINGS: MRI HEAD FINDINGS Study is degraded by motion artifact. Cerebral volume within normal limits for patient age. Patchy T2/FLAIR hyperintensity within the periventricular white matter most consistent with chronic small vessel ischemic disease, mild in nature. Small remote lacunar infarct within the left thalamus. No abnormal foci of  restricted diffusion to suggest acute intracranial infarct. Right vertebral artery is small and not well visualized. Major intracranial vascular flow voids are otherwise maintained. Gray-white matter differentiation preserved. No acute or chronic intracranial hemorrhage. No mass lesion, midline shift, or mass effect. No hydrocephalus. No extra-axial fluid collection. Craniocervical junction grossly normal, although evaluation somewhat limited due to motion artifact. Pituitary gland grossly normal. No acute abnormality about the orbits. Mild mucosal thickening within the paranasal sinuses. No air-fluid levels to suggest active sinus infection. Bilateral mastoid effusions present, right greater than left, likely benign in nature. Inner ear structures grossly normal. Bone marrow signal intensity within normal limits. No scalp soft tissue abnormality. MRA HEAD FINDINGS ANTERIOR CIRCULATION: Study degraded by motion artifact. Visualized distal cervical segments of the internal carotid arteries are patent with antegrade flow. Petrous, cavernous, and supraclinoid segments widely patent. Left A1 segment is hypoplastic but patent. Right A1 segment widely patent. Anterior communicating artery and anterior cerebral arteries well opacified and normal in appearance. M1 segments grossly patent, although evaluation limited by motion artifact. No definite stenosis. MCA bifurcations grossly normal. Distal MCA branches symmetric and well opacified bilaterally. POSTERIOR CIRCULATION: Left vertebral artery dominant and patent to the vertebrobasilar junction. Right vertebral artery not visualized, and may be occluded. Posterior inferior cerebral arteries not visualized on this exam. Basilar artery widely patent. Superior cerebellar arteries patent proximally. Right PCA arises from the basilar artery. Multi focal irregularity with several moderate to severe stenoses within the right P2 segment present. Fetal origin of the left PCA  suspected. The proximal left PCA is not well evaluated nor is the left posterior communicating artery due to motion artifact. Left PCA is opacified distally. IMPRESSION: MRI HEAD IMPRESSION: 1. No acute intracranial infarct or other process identified. 2. Small remote lacunar infarct within the left thalamus. 3. Mild chronic small vessel ischemic disease. MRA HEAD IMPRESSION: 1. Limited study due to motion artifact. Nonvisualization of the right vertebral artery, which may be occluded in the neck. Dominant left vertebral artery widely patent. 2. Multi focal moderate to severe stenoses within the proximal and mid right PCA. 3. Suspected fetal origin of the left PCA. Proximal and mid left PCA not well evaluated on this exam due to motion, although the left PCA is opacified distally. 4. No definite stenosis within the anterior circulation. Electronically Signed   By: Jeannine Boga M.D.   On: 05/22/2015 21:50   Mr Jodene Nam Head/brain Wo Cm  05/22/2015  CLINICAL DATA:  Initial evaluation for right-sided visual loss with right-sided numbness. EXAM: MRI HEAD WITHOUT CONTRAST MRA HEAD WITHOUT CONTRAST TECHNIQUE: Multiplanar, multiecho pulse sequences of the brain and surrounding structures were obtained without intravenous contrast. Angiographic images of the head were obtained using MRA technique without contrast. COMPARISON:  Prior CT from earlier the same day. FINDINGS: MRI HEAD FINDINGS Study is degraded by motion artifact. Cerebral volume within normal limits for patient age. Patchy T2/FLAIR hyperintensity within the periventricular white matter  most consistent with chronic small vessel ischemic disease, mild in nature. Small remote lacunar infarct within the left thalamus. No abnormal foci of restricted diffusion to suggest acute intracranial infarct. Right vertebral artery is small and not well visualized. Major intracranial vascular flow voids are otherwise maintained. Gray-white matter differentiation  preserved. No acute or chronic intracranial hemorrhage. No mass lesion, midline shift, or mass effect. No hydrocephalus. No extra-axial fluid collection. Craniocervical junction grossly normal, although evaluation somewhat limited due to motion artifact. Pituitary gland grossly normal. No acute abnormality about the orbits. Mild mucosal thickening within the paranasal sinuses. No air-fluid levels to suggest active sinus infection. Bilateral mastoid effusions present, right greater than left, likely benign in nature. Inner ear structures grossly normal. Bone marrow signal intensity within normal limits. No scalp soft tissue abnormality. MRA HEAD FINDINGS ANTERIOR CIRCULATION: Study degraded by motion artifact. Visualized distal cervical segments of the internal carotid arteries are patent with antegrade flow. Petrous, cavernous, and supraclinoid segments widely patent. Left A1 segment is hypoplastic but patent. Right A1 segment widely patent. Anterior communicating artery and anterior cerebral arteries well opacified and normal in appearance. M1 segments grossly patent, although evaluation limited by motion artifact. No definite stenosis. MCA bifurcations grossly normal. Distal MCA branches symmetric and well opacified bilaterally. POSTERIOR CIRCULATION: Left vertebral artery dominant and patent to the vertebrobasilar junction. Right vertebral artery not visualized, and may be occluded. Posterior inferior cerebral arteries not visualized on this exam. Basilar artery widely patent. Superior cerebellar arteries patent proximally. Right PCA arises from the basilar artery. Multi focal irregularity with several moderate to severe stenoses within the right P2 segment present. Fetal origin of the left PCA suspected. The proximal left PCA is not well evaluated nor is the left posterior communicating artery due to motion artifact. Left PCA is opacified distally. IMPRESSION: MRI HEAD IMPRESSION: 1. No acute intracranial  infarct or other process identified. 2. Small remote lacunar infarct within the left thalamus. 3. Mild chronic small vessel ischemic disease. MRA HEAD IMPRESSION: 1. Limited study due to motion artifact. Nonvisualization of the right vertebral artery, which may be occluded in the neck. Dominant left vertebral artery widely patent. 2. Multi focal moderate to severe stenoses within the proximal and mid right PCA. 3. Suspected fetal origin of the left PCA. Proximal and mid left PCA not well evaluated on this exam due to motion, although the left PCA is opacified distally. 4. No definite stenosis within the anterior circulation. Electronically Signed   By: Jeannine Boga M.D.   On: 05/22/2015 21:50        Scheduled Meds: . atorvastatin  40 mg Oral q1800  . canagliflozin  100 mg Oral QAC breakfast  . cloNIDine  0.2 mg Oral Daily  . clopidogrel  75 mg Oral Daily  . enoxaparin (LOVENOX) injection  40 mg Subcutaneous Q24H  . FLUoxetine  10 mg Oral Daily  . lisinopril  20 mg Oral Daily   And  . hydrochlorothiazide  12.5 mg Oral Daily  . insulin aspart  0-15 Units Subcutaneous TID WC  . insulin aspart  0-5 Units Subcutaneous QHS  . insulin aspart  4 Units Subcutaneous TID WC  . insulin detemir  60 Units Subcutaneous QHS  . lithium carbonate  300 mg Oral BID  . metoprolol succinate  25 mg Oral Daily  . tiotropium  18 mcg Inhalation Daily  . traZODone  100 mg Oral QHS   Continuous Infusions:   Principal Problem:   TIA (transient ischemic attack) Active  Problems:   Proliferative diabetic retinopathy of both eyes ([place])   Essential hypertension   Diabetes mellitus with diabetic retinopathy   Chronic kidney disease, stage II (mild)   Obesity   CVA (cerebral infarction)    Time spent: 40 minutes.    Vernell Leep, MD, FACP, FHM. [facility] Pager [phone]  If 7PM-7AM, please contact night-coverage [email or web address] Password [facility] 05/23/2015, 7:27 AM

## 2015-05-23 NOTE — Progress Notes (Signed)
VASCULAR LAB PRELIMINARY  PRELIMINARY  PRELIMINARY  PRELIMINARY  Carotid duplex completed.    Preliminary report:  1-39% ICA plaquing. Vertebral artery flow is antegrade.   Claudean Leavelle, RVT 05/23/2015, 12:26 PM

## 2015-05-23 NOTE — Progress Notes (Signed)
  Echocardiogram 2D Echocardiogram has been performed.  Darlina Sicilian M 05/23/2015, 11:25 AM

## 2015-05-23 NOTE — Consult Note (Signed)
Referring Physician: Dr Algis Liming    Chief Complaint: transient painless visual loss right eye, dysarthria, right arm numbness  HPI:                                                                                                                                         Samantha Pierce is an 44 y.o. female with a past medical history pertinent for HTN ,DM, CKD stage 3, left thalamic infarct 4/16, depression, and COPD who was in her usual state of health until this 2 days ago when she was at work and developed acute onset of painless visual loss right eye associated with speech impediment, right arm numbness and " some hurting of the right leg". Stated that the episode lased 10 minutes and resolved but throughout it she has " dazed".  Samantha Pierce said that she did not seek immediate medical attention but subsequently presented to [facility] where she had a CT brain that did not show acute abnormality and therefore was transferred to [facility] for further investigations of likely TIA. MRI/MRA brain complete and personally reviewed: no evidence of acute abnormality ob MRI, but MRA reveals nonvisualization of the right vertebral artery, which may be occluded in the neck. Dominant left vertebral artery widely patent and multi focal moderate to severe stenoses within the proximal and mid right PCA.  She takes aspirin daily. Presently, she denies HA, vertigo, double vision, difficulty swallowing, focal weakness, slurred speech, language or vision impairment.  Date last known well: 05/21/15 Time last known well: noon tPA Given: no, symptoms resolved   Past Medical History  Diagnosis Date  . Essential hypertension   . History of blood transfusion   . Diabetes mellitus with diabetic retinopathy   . Depression   . Anemia   . CKD (chronic kidney disease) stage 3, GFR 30-59 ml/min   . COPD (chronic obstructive pulmonary disease) ([place])   . History of stroke April 2016    Acute lacunar infarct of the left thalamus     Past Surgical History  Procedure Laterality Date  . Dilation and curettage of uterus    . Cesarean section      x2  . Tubal ligation    . Pars plana vitrectomy  04/20/2011    Procedure: PARS PLANA VITRECTOMY WITH 25 GAUGE;  Surgeon: Hayden Pedro, MD;  Location: [place];  Service: Ophthalmology;  Laterality: Left;  membrane peel, gas fluid exchange, endolaser, repair of complex retinal detachment  . Eye surgery      Right eye pars plano vitrectomy   . Lasik      Family History  Problem Relation Age of Onset  . Diabetes Mother    Social History:  reports that she quit smoking about 13 months ago. Her smoking use included Cigarettes. She started smoking about 33 years ago. She has a .9 pack-year smoking history. She has never used smokeless  tobacco. She reports that she does not drink alcohol or use illicit drugs. Family history: no MS, epilepsy, or brain tumor Allergies:  Allergies  Allergen Reactions  . Penicillins Rash    Has patient had a PCN reaction causing immediate rash, facial/tongue/throat swelling, SOB or lightheadedness with hypotension: Yes Has patient had a PCN reaction causing severe rash involving mucus membranes or skin necrosis: No Has patient had a PCN reaction that required hospitalization No Has patient had a PCN reaction occurring within the last 10 years: No If all of the above answers are "NO", then may proceed with Cephalosporin use.     Medications:                                                                                                                           Scheduled: . atorvastatin  40 mg Oral q1800  . canagliflozin  100 mg Oral QAC breakfast  . cloNIDine  0.2 mg Oral Daily  . clopidogrel  75 mg Oral Daily  . enoxaparin (LOVENOX) injection  40 mg Subcutaneous Q24H  . FLUoxetine  10 mg Oral Daily  . lisinopril  20 mg Oral Daily   And  . hydrochlorothiazide  12.5 mg Oral Daily  . insulin aspart  0-15 Units Subcutaneous TID WC  .  insulin aspart  0-5 Units Subcutaneous QHS  . insulin aspart  4 Units Subcutaneous TID WC  . insulin detemir  60 Units Subcutaneous QHS  . lithium carbonate  300 mg Oral BID  . metoprolol succinate  25 mg Oral Daily  . tiotropium  18 mcg Inhalation Daily  . traZODone  100 mg Oral QHS    ROS:                                                                                                                                       History obtained from chart review and the patient  General ROS: negative for - chills, fatigue, fever, night sweats, or weight loss Psychological ROS: negative for - behavioral disorder, hallucinations, memory difficulties, mood swings or suicidal ideation Ophthalmic ROS: negative for - blurry vision, double vision, eye pain or loss of vision ENT ROS: negative for - epistaxis, nasal discharge, oral lesions, sore throat, tinnitus or vertigo Allergy and Immunology ROS: negative for - hives or itchy/watery eyes Hematological and Lymphatic ROS: negative for - bleeding problems, bruising  or swollen lymph nodes Endocrine ROS: negative for - galactorrhea, hair pattern changes, polydipsia/polyuria or temperature intolerance Respiratory ROS: negative for - cough, hemoptysis, shortness of breath or wheezing Cardiovascular ROS: negative for - chest pain, dyspnea on exertion, edema or irregular heartbeat Gastrointestinal ROS: negative for - abdominal pain, diarrhea, hematemesis, nausea/vomiting or stool incontinence Genito-Urinary ROS: negative for - dysuria, hematuria, incontinence or urinary frequency/urgency Musculoskeletal ROS: negative for - joint swelling or muscular weakness Neurological ROS: as noted in HPI Dermatological ROS: negative for rash and skin lesion changes    Physical exam:  Constitutional: obese, pleasant female in no apparent distress. Blood pressure 173/82, pulse 81, temperature 98.4 F (36.9 C), temperature source Oral, resp. rate 16, height 5' 4" (1.626  m), weight 124.739 kg (275 lb), last menstrual period 04/29/2015, SpO2 96 %. Eyes: no jaundice or exophthalmos.  Head: normocephalic. Neck: supple, no bruits, no JVD. Cardiac: no murmurs. Lungs: clear. Abdomen: soft, no tender, no mass. Extremities: no edema, clubbing, or cyanosis.  Skin: no rash  Neurologic Examination:                                                                                                      General: NAD Mental Status: Alert, oriented, thought content appropriate.  Speech fluent without evidence of aphasia.  Able to follow 3 step commands without difficulty. Cranial Nerves: II: Discs flat bilaterally; Visual fields grossly normal, pupils equal, round, reactive to light and accommodation III,IV, VI: ptosis not present, extra-ocular motions intact bilaterally V,VII: smile symmetric, facial light touch sensation normal bilaterally VIII: hearing normal bilaterally IX,X: uvula rises symmetrically XI: bilateral shoulder shrug XII: midline tongue extension without atrophy or fasciculations  Motor: Right : Upper extremity   5/5    Left:     Upper extremity   5/5  Lower extremity   5/5     Lower extremity   5/5 Tone and bulk:normal tone throughout; no atrophy noted Sensory: Pinprick and light touch intact throughout, bilaterally Deep Tendon Reflexes:  Right: Upper Extremity   Left: Upper extremity   biceps (C-5 to C-6) 2/4   biceps (C-5 to C-6) 2/4 tricep (C7) 2/4    triceps (C7) 2/4 Brachioradialis (C6) 2/4  Brachioradialis (C6) 2/4  Lower Extremity Lower Extremity  quadriceps (L-2 to L-4) 2/4   quadriceps (L-2 to L-4) 2/4 Achilles (S1) 2/4   Achilles (S1) 2/4  Plantars: Right: downgoing   Left: downgoing Cerebellar: normal finger-to-nose,  normal heel-to-shin test Gait:  No ataxia.    Results for orders placed or performed during the hospital encounter of 05/22/15 (from the past 48 hour(s))  Ethanol     Status: None   Collection Time: 05/22/15  12:09 PM  Result Value Ref Range   Alcohol, Ethyl (B) <5 <5 mg/dL    Comment:        LOWEST DETECTABLE LIMIT FOR SERUM ALCOHOL IS 5 mg/dL FOR MEDICAL PURPOSES ONLY   Protime-INR     Status: None   Collection Time: 05/22/15 12:09 PM  Result Value Ref Range   Prothrombin Time 13.1 11.6 -  15.2 seconds   INR 0.97 0.00 - 1.49  APTT     Status: None   Collection Time: 05/22/15 12:09 PM  Result Value Ref Range   aPTT 26 24 - 37 seconds  CBC     Status: Abnormal   Collection Time: 05/22/15 12:09 PM  Result Value Ref Range   WBC 8.9 4.0 - 10.5 K/uL   RBC 4.79 3.87 - 5.11 MIL/uL   Hemoglobin 12.2 12.0 - 15.0 g/dL   HCT 36.4 36.0 - 46.0 %   MCV 76.0 (L) 78.0 - 100.0 fL   MCH 25.5 (L) 26.0 - 34.0 pg   MCHC 33.5 30.0 - 36.0 g/dL   RDW 13.3 11.5 - 15.5 %   Platelets 278 150 - 400 K/uL  Differential     Status: None   Collection Time: 05/22/15 12:09 PM  Result Value Ref Range   Neutrophils Relative % 65 %   Neutro Abs 5.8 1.7 - 7.7 K/uL   Lymphocytes Relative 23 %   Lymphs Abs 2.1 0.7 - 4.0 K/uL   Monocytes Relative 7 %   Monocytes Absolute 0.6 0.1 - 1.0 K/uL   Eosinophils Relative 5 %   Eosinophils Absolute 0.5 0.0 - 0.7 K/uL   Basophils Relative 0 %   Basophils Absolute 0.0 0.0 - 0.1 K/uL  Comprehensive metabolic panel     Status: Abnormal   Collection Time: 05/22/15 12:09 PM  Result Value Ref Range   Sodium 136 135 - 145 mmol/L   Potassium 3.6 3.5 - 5.1 mmol/L   Chloride 105 101 - 111 mmol/L   CO2 24 22 - 32 mmol/L   Glucose, Bld 310 (H) 65 - 99 mg/dL   BUN 19 6 - 20 mg/dL   Creatinine, Ser 1.75 (H) 0.44 - 1.00 mg/dL   Calcium 8.1 (L) 8.9 - 10.3 mg/dL   Total Protein 5.3 (L) 6.5 - 8.1 g/dL   Albumin 1.9 (L) 3.5 - 5.0 g/dL   AST 21 15 - 41 U/L   ALT 17 14 - 54 U/L   Alkaline Phosphatase 50 38 - 126 U/L   Total Bilirubin 0.4 0.3 - 1.2 mg/dL   GFR calc non Af Amer 34 (L) >60 mL/min   GFR calc Af Amer 40 (L) >60 mL/min    Comment: (NOTE) The eGFR has been calculated  using the CKD EPI equation. This calculation has not been validated in all clinical situations. eGFR's persistently <60 mL/min signify possible Chronic Kidney Disease.    Anion gap 7 5 - 15  I-stat troponin, ED (not at [facility], [facility])     Status: None   Collection Time: 05/22/15 12:23 PM  Result Value Ref Range   Troponin i, poc 0.02 0.00 - 0.08 ng/mL   Comment 3            Comment: Due to the release kinetics of cTnI, a negative result within the first hours of the onset of symptoms does not rule out myocardial infarction with certainty. If myocardial infarction is still suspected, repeat the test at appropriate intervals.   I-Stat Chem 8, ED  (not at [facility], [facility])     Status: Abnormal   Collection Time: 05/22/15 12:24 PM  Result Value Ref Range   Sodium 137 135 - 145 mmol/L   Potassium 3.6 3.5 - 5.1 mmol/L   Chloride 103 101 - 111 mmol/L   BUN 17 6 - 20 mg/dL   Creatinine, Ser 1.60 (H) 0.44 - 1.00 mg/dL  Glucose, Bld 305 (H) 65 - 99 mg/dL   Calcium, Ion 1.13 1.12 - 1.23 mmol/L   TCO2 23 0 - 100 mmol/L   Hemoglobin 12.6 12.0 - 15.0 g/dL   HCT 37.0 36.0 - 46.0 %  Urine rapid drug screen (hosp performed)not at [facility]     Status: None   Collection Time: 05/22/15 12:45 PM  Result Value Ref Range   Opiates NONE DETECTED NONE DETECTED   Cocaine NONE DETECTED NONE DETECTED   Benzodiazepines NONE DETECTED NONE DETECTED   Amphetamines NONE DETECTED NONE DETECTED   Tetrahydrocannabinol NONE DETECTED NONE DETECTED   Barbiturates NONE DETECTED NONE DETECTED    Comment:        DRUG SCREEN FOR MEDICAL PURPOSES ONLY.  IF CONFIRMATION IS NEEDED FOR ANY PURPOSE, NOTIFY LAB WITHIN 5 DAYS.        LOWEST DETECTABLE LIMITS FOR URINE DRUG SCREEN Drug Class       Cutoff (ng/mL) Amphetamine      1000 Barbiturate      200 Benzodiazepine   160 Tricyclics       737 Opiates          300 Cocaine          300 THC              50   Urinalysis, Routine w reflex microscopic (not at [facility])     Status:  Abnormal   Collection Time: 05/22/15 12:45 PM  Result Value Ref Range   Color, Urine YELLOW YELLOW   APPearance CLEAR CLEAR   Specific Gravity, Urine 1.020 1.005 - 1.030   pH 6.5 5.0 - 8.0   Glucose, UA >1000 (A) NEGATIVE mg/dL   Hgb urine dipstick MODERATE (A) NEGATIVE   Bilirubin Urine NEGATIVE NEGATIVE   Ketones, ur NEGATIVE NEGATIVE mg/dL   Protein, ur >300 (A) NEGATIVE mg/dL   Nitrite NEGATIVE NEGATIVE   Leukocytes, UA NEGATIVE NEGATIVE  Urine microscopic-add on     Status: Abnormal   Collection Time: 05/22/15 12:45 PM  Result Value Ref Range   Squamous Epithelial / LPF 0-5 (A) NONE SEEN   WBC, UA NONE SEEN 0 - 5 WBC/hpf   RBC / HPF 0-5 0 - 5 RBC/hpf   Bacteria, UA NONE SEEN NONE SEEN  Troponin I     Status: None   Collection Time: 05/22/15  4:26 PM  Result Value Ref Range   Troponin I 0.03 <0.031 ng/mL    Comment:        NO INDICATION OF MYOCARDIAL INJURY.   Glucose, capillary     Status: Abnormal   Collection Time: 05/22/15  5:46 PM  Result Value Ref Range   Glucose-Capillary 120 (H) 65 - 99 mg/dL  CBC     Status: Abnormal   Collection Time: 05/22/15  6:59 PM  Result Value Ref Range   WBC 11.5 (H) 4.0 - 10.5 K/uL   RBC 5.49 (H) 3.87 - 5.11 MIL/uL   Hemoglobin 14.0 12.0 - 15.0 g/dL   HCT 41.9 36.0 - 46.0 %   MCV 76.3 (L) 78.0 - 100.0 fL   MCH 25.5 (L) 26.0 - 34.0 pg   MCHC 33.4 30.0 - 36.0 g/dL   RDW 13.4 11.5 - 15.5 %   Platelets 293 150 - 400 K/uL  Creatinine, serum     Status: Abnormal   Collection Time: 05/22/15  6:59 PM  Result Value Ref Range   Creatinine, Ser 1.70 (H) 0.44 - 1.00 mg/dL   GFR  calc non Af Amer 36 (L) >60 mL/min   GFR calc Af Amer 41 (L) >60 mL/min    Comment: (NOTE) The eGFR has been calculated using the CKD EPI equation. This calculation has not been validated in all clinical situations. eGFR's persistently <60 mL/min signify possible Chronic Kidney Disease.   Glucose, capillary     Status: Abnormal   Collection Time: 05/22/15  10:06 PM  Result Value Ref Range   Glucose-Capillary 326 (H) 65 - 99 mg/dL  Lipid panel     Status: Abnormal   Collection Time: 05/23/15  5:45 AM  Result Value Ref Range   Cholesterol 245 (H) 0 - 200 mg/dL   Triglycerides 278 (H) <150 mg/dL   HDL 45 >40 mg/dL   Total CHOL/HDL Ratio 5.4 RATIO   VLDL 56 (H) 0 - 40 mg/dL   LDL Cholesterol 144 (H) 0 - 99 mg/dL    Comment:        Total Cholesterol/HDL:CHD Risk Coronary Heart Disease Risk Table                     Men   Women  1/2 Average Risk   3.4   3.3  Average Risk       5.0   4.4  2 X Average Risk   9.6   7.1  3 X Average Risk  23.4   11.0        Use the calculated Patient Ratio above and the CHD Risk Table to determine the patient's CHD Risk.        ATP III CLASSIFICATION (LDL):  <100     mg/dL   Optimal  100-129  mg/dL   Near or Above                    Optimal  130-159  mg/dL   Borderline  160-189  mg/dL   High  >190     mg/dL   Very High   Glucose, capillary     Status: Abnormal   Collection Time: 05/23/15  6:25 AM  Result Value Ref Range   Glucose-Capillary 359 (H) 65 - 99 mg/dL   Comment 1 Notify RN    Comment 2 Document in Chart    Ct Head Wo Contrast  05/22/2015  CLINICAL DATA:  Left-sided numbness and right eye visual changes EXAM: CT HEAD WITHOUT CONTRAST TECHNIQUE: Contiguous axial images were obtained from the base of the skull through the vertex without intravenous contrast. COMPARISON:  09/03/2014 FINDINGS: Bony calvarium is intact. No findings to suggest acute hemorrhage, acute infarction or space-occupying mass lesion are noted. IMPRESSION: No acute abnormality noted. Electronically Signed   By: Inez Catalina M.D.   On: 05/22/2015 12:40   Mr Brain Wo Contrast  05/22/2015  CLINICAL DATA:  Initial evaluation for right-sided visual loss with right-sided numbness. EXAM: MRI HEAD WITHOUT CONTRAST MRA HEAD WITHOUT CONTRAST TECHNIQUE: Multiplanar, multiecho pulse sequences of the brain and surrounding structures  were obtained without intravenous contrast. Angiographic images of the head were obtained using MRA technique without contrast. COMPARISON:  Prior CT from earlier the same day. FINDINGS: MRI HEAD FINDINGS Study is degraded by motion artifact. Cerebral volume within normal limits for patient age. Patchy T2/FLAIR hyperintensity within the periventricular white matter most consistent with chronic small vessel ischemic disease, mild in nature. Small remote lacunar infarct within the left thalamus. No abnormal foci of restricted diffusion to suggest acute intracranial infarct. Right vertebral artery is small and  not well visualized. Major intracranial vascular flow voids are otherwise maintained. Gray-white matter differentiation preserved. No acute or chronic intracranial hemorrhage. No mass lesion, midline shift, or mass effect. No hydrocephalus. No extra-axial fluid collection. Craniocervical junction grossly normal, although evaluation somewhat limited due to motion artifact. Pituitary gland grossly normal. No acute abnormality about the orbits. Mild mucosal thickening within the paranasal sinuses. No air-fluid levels to suggest active sinus infection. Bilateral mastoid effusions present, right greater than left, likely benign in nature. Inner ear structures grossly normal. Bone marrow signal intensity within normal limits. No scalp soft tissue abnormality. MRA HEAD FINDINGS ANTERIOR CIRCULATION: Study degraded by motion artifact. Visualized distal cervical segments of the internal carotid arteries are patent with antegrade flow. Petrous, cavernous, and supraclinoid segments widely patent. Left A1 segment is hypoplastic but patent. Right A1 segment widely patent. Anterior communicating artery and anterior cerebral arteries well opacified and normal in appearance. M1 segments grossly patent, although evaluation limited by motion artifact. No definite stenosis. MCA bifurcations grossly normal. Distal MCA branches  symmetric and well opacified bilaterally. POSTERIOR CIRCULATION: Left vertebral artery dominant and patent to the vertebrobasilar junction. Right vertebral artery not visualized, and may be occluded. Posterior inferior cerebral arteries not visualized on this exam. Basilar artery widely patent. Superior cerebellar arteries patent proximally. Right PCA arises from the basilar artery. Multi focal irregularity with several moderate to severe stenoses within the right P2 segment present. Fetal origin of the left PCA suspected. The proximal left PCA is not well evaluated nor is the left posterior communicating artery due to motion artifact. Left PCA is opacified distally. IMPRESSION: MRI HEAD IMPRESSION: 1. No acute intracranial infarct or other process identified. 2. Small remote lacunar infarct within the left thalamus. 3. Mild chronic small vessel ischemic disease. MRA HEAD IMPRESSION: 1. Limited study due to motion artifact. Nonvisualization of the right vertebral artery, which may be occluded in the neck. Dominant left vertebral artery widely patent. 2. Multi focal moderate to severe stenoses within the proximal and mid right PCA. 3. Suspected fetal origin of the left PCA. Proximal and mid left PCA not well evaluated on this exam due to motion, although the left PCA is opacified distally. 4. No definite stenosis within the anterior circulation. Electronically Signed   By: Jeannine Boga M.D.   On: 05/22/2015 21:50   Mr Jodene Nam Head/brain Wo Cm  05/22/2015  CLINICAL DATA:  Initial evaluation for right-sided visual loss with right-sided numbness. EXAM: MRI HEAD WITHOUT CONTRAST MRA HEAD WITHOUT CONTRAST TECHNIQUE: Multiplanar, multiecho pulse sequences of the brain and surrounding structures were obtained without intravenous contrast. Angiographic images of the head were obtained using MRA technique without contrast. COMPARISON:  Prior CT from earlier the same day. FINDINGS: MRI HEAD FINDINGS Study is degraded  by motion artifact. Cerebral volume within normal limits for patient age. Patchy T2/FLAIR hyperintensity within the periventricular white matter most consistent with chronic small vessel ischemic disease, mild in nature. Small remote lacunar infarct within the left thalamus. No abnormal foci of restricted diffusion to suggest acute intracranial infarct. Right vertebral artery is small and not well visualized. Major intracranial vascular flow voids are otherwise maintained. Gray-white matter differentiation preserved. No acute or chronic intracranial hemorrhage. No mass lesion, midline shift, or mass effect. No hydrocephalus. No extra-axial fluid collection. Craniocervical junction grossly normal, although evaluation somewhat limited due to motion artifact. Pituitary gland grossly normal. No acute abnormality about the orbits. Mild mucosal thickening within the paranasal sinuses. No air-fluid levels to suggest active sinus  infection. Bilateral mastoid effusions present, right greater than left, likely benign in nature. Inner ear structures grossly normal. Bone marrow signal intensity within normal limits. No scalp soft tissue abnormality. MRA HEAD FINDINGS ANTERIOR CIRCULATION: Study degraded by motion artifact. Visualized distal cervical segments of the internal carotid arteries are patent with antegrade flow. Petrous, cavernous, and supraclinoid segments widely patent. Left A1 segment is hypoplastic but patent. Right A1 segment widely patent. Anterior communicating artery and anterior cerebral arteries well opacified and normal in appearance. M1 segments grossly patent, although evaluation limited by motion artifact. No definite stenosis. MCA bifurcations grossly normal. Distal MCA branches symmetric and well opacified bilaterally. POSTERIOR CIRCULATION: Left vertebral artery dominant and patent to the vertebrobasilar junction. Right vertebral artery not visualized, and may be occluded. Posterior inferior cerebral  arteries not visualized on this exam. Basilar artery widely patent. Superior cerebellar arteries patent proximally. Right PCA arises from the basilar artery. Multi focal irregularity with several moderate to severe stenoses within the right P2 segment present. Fetal origin of the left PCA suspected. The proximal left PCA is not well evaluated nor is the left posterior communicating artery due to motion artifact. Left PCA is opacified distally. IMPRESSION: MRI HEAD IMPRESSION: 1. No acute intracranial infarct or other process identified. 2. Small remote lacunar infarct within the left thalamus. 3. Mild chronic small vessel ischemic disease. MRA HEAD IMPRESSION: 1. Limited study due to motion artifact. Nonvisualization of the right vertebral artery, which may be occluded in the neck. Dominant left vertebral artery widely patent. 2. Multi focal moderate to severe stenoses within the proximal and mid right PCA. 3. Suspected fetal origin of the left PCA. Proximal and mid left PCA not well evaluated on this exam due to motion, although the left PCA is opacified distally. 4. No definite stenosis within the anterior circulation. Electronically Signed   By: Jeannine Boga M.D.   On: 05/22/2015 21:50     Assessment: 44 y.o. female with multiple risk factors for stroke, transferred to [facility] for further evaluation of transient (10 minutes) painless right visual loss associated with dysarthria and right arm numbness. MRI brain without acute abnormality. MRA demonstrates nonvisualization of the right vertebral artery, which may be occluded in the neck. Dominant left vertebral artery widely patent and multi focal moderate to severe stenoses within the proximal and mid right PCA (probably no the culprit vessel). Likely left cortical TIA. TIA work up underway. On aspirin. If ok with stroke attending, she may benefit of dual antiplatelet therapy x 3 months then aspirin will suffice, as she has radiographic evidence of  multifocal intracranial atherosclerotic arterial disease.  Stroke team will follow up tomorrow.  Stroke Risk Factors -  HTN ,DM, CKD stage 3, stroke, obesity  Plan: 1. HgbA1c, fasting lipid panel 2. MRI, MRA  of the brain without contrast 3. Echocardiogram 4. Carotid dopplers 5. Prophylactic therapy-aspirin+plavix  6. Risk factor modification 7. Telemetry monitoring 8. Frequent neuro checks 9. PT/OT SLP   Dorian Pod, MD [facility] [phone]  05/23/2015, 9:48 AM

## 2015-05-23 NOTE — Evaluation (Addendum)
Occupational Therapy Evaluation Patient Details Name: Samantha Pierce MRN: [id number] DOB: [birth year] Today's Date: 05/23/2015    History of Present Illness Pt is a 44 y.o. female with a PMH significant for L MCA CVA in 08/2014, DM with diabetic retinopathy, HTN, CKD. Pt presents to [facility] for loss of vision out of R eye that lasted ~15 minutes and severe numbness over right side of face and R arm the day before while she was working. 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 CT and MRI negative for acute findings.    Clinical Impression   Pt admitted with above. Pt independent with ADLs, PTA. Feel pt will benefit from acute OT to increase independence and strength prior to d/c.     Follow Up Recommendations  No OT follow up;Supervision - Intermittent    Equipment Recommendations  Tub/shower bench    Recommendations for Other Services       Precautions / Restrictions Precautions Precautions: Fall Precaution Comments: Residual balance deficits from prior stroke.  Restrictions Weight Bearing Restrictions: No      Mobility Bed Mobility Overal bed mobility: Modified Independent                Transfers Overall transfer level: Modified independent                    Balance    Pt reports balance does not feel like baseline-Min guard in hallway. Balance not formally assessed.                                        ADL Overall ADL's : Needs assistance/impaired                     Lower Body Dressing: Set up;Supervision/safety;Sit to/from stand   Toilet Transfer: Min guard;Ambulation (sit to stand from bed-Mod I)       Tub/ Shower Transfer: Tub Programmer, multimedia Details (indicate cue type and reason): pt able to step over small trash can with Min guard but unable to clear height that pt reports is like her tub at home Functional mobility during  ADLs: Min guard General ADL Comments: Educated on safety such as sitting for LB ADLs and safe footwear. Recommended someone be with her for tub transfer and bathing at least initially. Educated on tub transfer techniques. Discussed tub bench     Vision  Pt with diabetic retinopathy; Reports vision is now at baseline   Perception     Praxis      Pertinent Vitals/Pain Pain Assessment: No/denies pain     Hand Dominance Left   Extremity/Trunk Assessment Upper Extremity Assessment Upper Extremity Assessment: RUE deficits/detail;LUE deficits/detail RUE Deficits / Details: residual weakness from previous stroke RUE Sensation: decreased light touch LUE Deficits / Details: generalized weakness in shoulder flexors   Lower Extremity Assessment Lower Extremity Assessment: Defer to PT evaluation       Communication Communication Communication: No difficulties   Cognition Arousal/Alertness: Awake/alert Behavior During Therapy: WFL for tasks assessed/performed Overall Cognitive Status: Within Functional Limits for tasks assessed                     General Comments       Exercises       Shoulder Instructions  Home Living Family/patient expects to be discharged to:: Private residence Living Arrangements: Children Available Help at Discharge: Family;Available PRN/intermittently Type of Home: Apartment Home Access: Stairs to enter Entrance Stairs-Number of Steps: 1 Entrance Stairs-Rails: None Home Layout: One level     Bathroom Shower/Tub: Teacher, early years/pre: Standard     Home Equipment: Grab bars - tub/shower          Prior Functioning/Environment Level of Independence: Independent             OT Diagnosis: Generalized weakness   OT Problem List: Decreased strength;Obesity;Decreased knowledge of use of DME or AE;Decreased range of motion;Decreased activity tolerance;Impaired balance (sitting and/or standing);Increased  edema;Impaired vision/perception;    OT Treatment/Interventions: Self-care/ADL training;DME and/or AE instruction;Therapeutic exercise;Therapeutic activities;Balance training;Patient/family education;Visual/perceptual remediation/compensation    OT Goals(Current goals can be found in the care plan section) Acute Rehab OT Goals Patient Stated Goal: not stated OT Goal Formulation: With patient Time For Goal Achievement: 05/30/15 Potential to Achieve Goals: Good ADL Goals Pt Will Perform Lower Body Dressing: with modified independence;sit to/from stand (including gathering items from closet/drawer like home) Additional ADL Goal #1: Pt will independently perform HEP for bilateral UEs with theraband to increase strength.  OT Frequency: Min 2X/week   Barriers to D/C:            Co-evaluation              End of Session Equipment Utilized During Treatment: Gait belt  Activity Tolerance: Patient tolerated treatment well Patient left: in bed;with call bell/phone within reach   Time: 1459-1513 OT Time Calculation (min): 14 min Charges:  OT General Charges $OT Visit: 1 Procedure OT Evaluation $Initial OT Evaluation Tier I: 1 Procedure G-Codes: OT G-codes **NOT FOR INPATIENT CLASS** Functional Assessment Tool Used: clinical judgment Functional Limitation: Self care Self Care Current Status [id number]): At least 1 percent but less than 20 percent impaired, limited or restricted Self Care Goal Status [id number]): 0 percent impaired, limited or restricted  Benito Mccreedy OTR/L [id number] 05/23/2015, 3:33 PM

## 2015-05-24 DIAGNOSIS — G451 Carotid artery syndrome (hemispheric): Secondary | ICD-10-CM | POA: Diagnosis not present

## 2015-05-24 DIAGNOSIS — G459 Transient cerebral ischemic attack, unspecified: Secondary | ICD-10-CM | POA: Diagnosis not present

## 2015-05-24 DIAGNOSIS — I10 Essential (primary) hypertension: Secondary | ICD-10-CM | POA: Diagnosis not present

## 2015-05-24 LAB — GLUCOSE, CAPILLARY
Glucose-Capillary: 110 mg/dL — ABNORMAL HIGH (ref 65–99)
Glucose-Capillary: 182 mg/dL — ABNORMAL HIGH (ref 65–99)
Glucose-Capillary: 233 mg/dL — ABNORMAL HIGH (ref 65–99)

## 2015-05-24 LAB — BASIC METABOLIC PANEL
Anion gap: 6 (ref 5–15)
BUN: 17 mg/dL (ref 6–20)
CO2: 25 mmol/L (ref 22–32)
Calcium: 8.3 mg/dL — ABNORMAL LOW (ref 8.9–10.3)
Chloride: 104 mmol/L (ref 101–111)
Creatinine, Ser: 1.85 mg/dL — ABNORMAL HIGH (ref 0.44–1.00)
GFR calc Af Amer: 37 mL/min — ABNORMAL LOW (ref >60)
GFR calc non Af Amer: 32 mL/min — ABNORMAL LOW (ref >60)
Glucose, Bld: 242 mg/dL — ABNORMAL HIGH (ref 65–99)
Potassium: 3.6 mmol/L (ref 3.5–5.1)
Sodium: 135 mmol/L (ref 135–145)

## 2015-05-24 LAB — HEMOGLOBIN A1C
Hgb A1c MFr Bld: 10.2 % — ABNORMAL HIGH (ref 4.8–5.6)
Hgb A1c MFr Bld: 10.4 % — ABNORMAL HIGH (ref 4.8–5.6)
Mean Plasma Glucose: 246 mg/dL
Mean Plasma Glucose: 252 mg/dL

## 2015-05-24 MED ORDER — ATORVASTATIN CALCIUM 80 MG PO TABS: 80.0000 mg | ORAL_TABLET | Freq: Every day | ORAL | Status: DC

## 2015-05-24 MED ORDER — ASPIRIN 81 MG PO TBEC: 81.0000 mg | DELAYED_RELEASE_TABLET | Freq: Every day | ORAL | Status: DC

## 2015-05-24 MED ORDER — ASPIRIN EC 81 MG PO TBEC
81.0000 mg | DELAYED_RELEASE_TABLET | Freq: Every day | ORAL | Status: DC
  Administered 2015-05-24: 81 mg via ORAL
  Filled 2015-05-24: qty 1

## 2015-05-24 MED ORDER — INSULIN DETEMIR 100 UNIT/ML FLEXPEN: 60.0000 [IU] | PEN_INJECTOR | Freq: Every day | SUBCUTANEOUS | Status: DC

## 2015-05-24 MED ORDER — CLOPIDOGREL BISULFATE 75 MG PO TABS: 75.0000 mg | ORAL_TABLET | Freq: Every day | ORAL | Status: DC

## 2015-05-24 NOTE — Progress Notes (Signed)
Discharge orders received.  Discharge instructions and follow-up appointments reviewed with the patient.  VSS upon discharge.  IV removed and education complete.  Transported out via wheelchair.   Vadim Centola M, RN 

## 2015-05-24 NOTE — Progress Notes (Signed)
Physical Therapy Treatment Patient Details Name: Samantha Pierce MRN: [id number] DOB: 03-24-1971 Today's Date: 05/24/2015    History of Present Illness Pt is a 44 y/o female with a PMH significant for L MCA CVA in 08/2014, DM with diabetic retinopathy, HTN, CKD. Pt presents to [facility] for loss of vision out of R eye that lasted ~15 minutes and severe numbness over right side of face and R arm the day before while she was working. 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 CT and MRI negative for acute findings.     PT Comments    Pt progressing towards physical therapy goals. Overall not moving as quickly as yesterday, however appears more comfortable with the [facility] today. Confirmed that pt is still comfortable with outpatient PT, however gave the option of home health if driving is restricted upon d/c. Will continue to follow and progress as able per POC.   Follow Up Recommendations  Outpatient PT;Supervision for mobility/OOB     Equipment Recommendations  Cane    Recommendations for Other Services       Precautions / Restrictions Precautions Precautions: Fall Precaution Comments: Residual balance deficits from prior stroke.  Restrictions Weight Bearing Restrictions: No    Mobility  Bed Mobility Overal bed mobility: Modified Independent             General bed mobility comments: No assist required. No use of bed rails for support.   Transfers Overall transfer level: Needs assistance Equipment used: Straight cane Transfers: Sit to/from Stand Sit to Stand: Supervision         General transfer comment: Supervision for safety.   Ambulation/Gait Ambulation/Gait assistance: Supervision Ambulation Distance (Feet): 400 Feet Assistive device: Straight cane Gait Pattern/deviations: Step-through pattern;Decreased stride length;Trendelenburg;Wide base of support Gait velocity: Decreased Gait velocity  interpretation: Below normal speed for age/gender General Gait Details: SPC provided throughout gait training. Pt moving slow with occasional unsteadiness, however no assist required for pt to recover.   Stairs            Wheelchair Mobility    Modified Rankin (Stroke Patients Only)       Balance Overall balance assessment: Needs assistance Sitting-balance support: Feet supported;No upper extremity supported Sitting balance-Leahy Scale: Fair     Standing balance support: During functional activity;Single extremity supported Standing balance-Leahy Scale: Poor Standing balance comment: Dynamically, better with SPC                    Cognition Arousal/Alertness: Awake/alert Behavior During Therapy: WFL for tasks assessed/performed Overall Cognitive Status: Within Functional Limits for tasks assessed                      Exercises      General Comments        Pertinent Vitals/Pain Pain Assessment: No/denies pain    Home Living                      Prior Function            PT Goals (current goals can now be found in the care plan section) Acute Rehab PT Goals Patient Stated Goal: Home today PT Goal Formulation: With patient Time For Goal Achievement: 05/30/15 Potential to Achieve Goals: Good Progress towards PT goals: Progressing toward goals    Frequency  Min 3X/week    PT Plan Current plan remains appropriate  Co-evaluation             End of Session Equipment Utilized During Treatment: Gait belt Activity Tolerance: Patient tolerated treatment well Patient left: in chair;with call bell/phone within reach     Time: 1145-1209 PT Time Calculation (min) (ACUTE ONLY): 24 min  Charges:  $Gait Training: 23-37 mins                    G Codes:      Rolinda Roan 06/03/2015, 1:24 PM   Rolinda Roan, PT, DPT Acute Rehabilitation Services Pager: [phone]

## 2015-05-24 NOTE — Discharge Summary (Addendum)
Physician Discharge Summary  Samantha Pierce [id number] DOB: 02/27/1971 DOA: 05/22/2015  PCP: Carlynn Spry, NP  Admit date: 05/22/2015 Discharge date: 05/24/2015  Time spent: Greater than 30 minutes  Recommendations for Outpatient Follow-up:  1. [name], NP/PCP in 3 days with repeat labs (BMP). 2. [name], Nephrology in 1 week. 3. [name], Neurology in 1 month. 4. Dr. Winfield Cunas at [facility]: Call on 05/25/15 for medication refills and follow-up. 5. Outpatient PT, double/shower bench & single-point cane 6. Recommend outpatient ophthalmology consultation and follow-up.  Discharge Diagnoses:  Principal Problem:   TIA (transient ischemic attack) Active Problems:   Proliferative diabetic retinopathy of both eyes ([place])   Essential hypertension   Diabetes mellitus with diabetic retinopathy   Chronic kidney disease, stage II (mild)   Obesity   CVA (cerebral infarction)   Discharge Condition: Improved & Stable  Diet recommendation: Heart healthy and diabetic diet.  Filed Weights   05/22/15 1137  Weight: 124.739 kg (275 lb)    History of present illness:  44 year old female patient with history of left MCA CVA March 2016, diabetes with retinopathy, HTN, recently diagnosed COPD, stage III chronic kidney disease, awaiting nuclear stress test on 12/27 for evaluation of chest pain, works in daycare, presented to [facility] on 05/22/15 with transient right eye blindness which occurred on 12/16 at 12:15 PM. This was associated with right upper extremity numbness, dysarthria and speech abnormality. Symptoms resolved in 15 minutes. EMS came by but she declined coming to the hospital that day and presented the subsequent day. CT head negative for acute abnormality. Transferred to [facility] for MRI brain and stroke workup.  Hospital Course:   TIA- likely left cortical TIA - MRI brain: No acute intracranial infarct. - MRA brain:  Nonvisualization of the right vertebral artery which may be occluded in the neck. Dominant left vertebral artery widely patent. Multi-focal moderate to severe stenosis within the proximal and mid right PCA. Suspected fetal origin of the left PCA. No definite stenosis within the anterior circulation. - Carotid Dopplers: 1-39 percent ICA plaquing. Vertebral artery flow is antegrade. - 2-D echo: LVEF 60-65 percent and grade 1 diastolic dysfunction. Trivial pericardial effusion.  - LDL 144 - A1c: 10.4 - patient was on aspirin prior to admission. Discussed at length with stroke M.D./[name] who recommended DAPT including aspirin 81 MG daily + Plavix 75 MG daily for 3 months followed by Plavix alone thereafter. He recommended outpatient follow-up with her primary neurologist. - Therapies: Outpatient PT, supervision for mobility/OOB, Cane  Essential hypertension  - Fluctuating and mildly uncontrolled. Continue home dose of clonidine and metoprolol.  Hyperlipidemia - LDL 144, goal <70. Was on atorvastatin 40 mg daily, increased to 80 mg daily.   Uncontrolled type II DM/IDDM with renal complications  - [id number] suggesting poor outpatient control. Not sure if she is fully compliant with meds and diet. Counseled extensively regarding same. Patient missed Levemir dose on night of transfer from [facility] to [facility] leading to high initial CBGs. Since then CBGs are better controlled. Continue current home dose of Levemir and NovoLog SSI. Close outpatient follow-up and will need adjustment of medications for better diabetes control. - Recommended outpatient follow-up with primary nephrologist. - Invokana discontinued d/t renal insufficiency.  Right thigh pain - Possibly muscular sprain. Lower extremity venous Doppler negative. Apart from mild focal tenderness, no acute findings on exam. Symptomatic treatment and monitor. Significantly improved or even resolved.  Diabetic retinopathy  - Strongly  recommend outpatient ophthalmology consultation and follow-up  Acute on stage III chronic kidney disease - Baseline creatinine not fully clear. Since arrival to, creatinine had gradually increased from 1.6-1.9. Hydrated briefly with IV fluids. Creat slightly better at 1.85. Outpatient follow-up with nephrology.  COPD - Stable. Former smoker.  Intermittent chest pain - Evaluated by cardiology on 05/20/15 and patient states that she has appointment for nuclear stress test on 12/27.  Morbid obesity.  Psychiatry: PTSD/anxiety/bipolar disorder 1 - Patient states that she follows with Dr. Wilhelmina Mcardle with [facility] services. She claims that she has been on Prozac, trazodone and lithium since September 2015 but has not taken these medications for the last 3 months prior to admission. She was advised to contact her primary psychiatrist upon discharge for medication refills and follow-up. She verbalized understanding.     Consultants:  Neurology  Procedures:  2-D echo 05/23/15: Study Conclusions  - Left ventricle: The cavity size was normal. There was moderate concentric hypertrophy. Systolic function was normal. The estimated ejection fraction was in the range of 60% to 65%. Wall motion was normal; there were no regional wall motion abnormalities. Doppler parameters are consistent with abnormal left ventricular relaxation (grade 1 diastolic dysfunction). Doppler parameters are consistent with elevated ventricular end-diastolic filling pressure. - Aortic valve: Trileaflet; normal thickness leaflets. There was mild regurgitation. - Aortic root: The aortic root was normal in size. - Mitral valve: Structurally normal valve. There was trivial regurgitation. - Right ventricle: The cavity size was normal. Wall thickness was normal. Systolic function was normal. - Right atrium: The atrium was normal in size. - Tricuspid valve: Structurally normal valve. There was  trivial regurgitation. - Pulmonic valve: There was trivial regurgitation. - Inferior vena cava: The vessel was normal in size. - Pericardium, extracardiac: A trivial pericardial effusion was identified posterior to the heart. Features were not consistent with tamponade physiology.  VASCULAR LAB PRELIMINARY PRELIMINARY PRELIMINARY PRELIMINARY  Carotid duplex completed.   Preliminary report: 1-39% ICA plaquing. Vertebral artery flow is antegrade.   Antibiotics:  None  Discharge Exam:  Complaints: Right thigh pain has improved or resolved. No recurrence of strokelike symptoms that prompted hospitalization. Denies any other complaints.  Filed Vitals:   05/24/15 0143 05/24/15 0647 05/24/15 1015 05/24/15 1452  BP: 131/75 160/80 151/72 133/54  Pulse: 64 76 70 63  Temp: 97.9 F (36.6 C) 98.2 F (36.8 C) 98.3 F (36.8 C) 97.7 F (36.5 C)  TempSrc: Oral Oral Oral Oral  Resp: 18 18 18 18   Height:      Weight:      SpO2: 98% 98% 96% 97%    General exam: Pleasant young female, moderately built and morbidly obese, sitting up comfortably in bed this morning.  Respiratory system: Clear. No increased work of breathing. Cardiovascular system: S1 & S2 heard, RRR. No JVD, murmurs, gallops, clicks or pedal edema.Telemetry: SR. Gastrointestinal system: Abdomen is nondistended, soft and nontender. Normal bowel sounds heard. Central nervous system: Alert and oriented. No focal neurological deficits. Extremities: Symmetric 5 x 5 power.   Discharge Instructions      Discharge Instructions    Call MD for:    Complete by:  As directed   Strokelike symptoms.  Strokelike symptoms.     Diet - low sodium heart healthy    Complete by:  As directed      Diet Carb Modified    Complete by:  As directed      Increase activity slowly    Complete by:  As directed             Medication List    STOP taking these medications        aspirin 325 MG tablet  Replaced by:   aspirin 81 MG EC tablet     INVOKANA 100 MG Tabs tablet  Generic drug:  canagliflozin      TAKE these medications        albuterol 108 (90 BASE) MCG/ACT inhaler  Commonly known as:  PROVENTIL HFA;VENTOLIN HFA  Inhale 1-2 puffs into the lungs every 6 (six) hours as needed for wheezing or shortness of breath.     aspirin 81 MG EC tablet  Take 1 tablet (81 mg total) by mouth daily.     atorvastatin 80 MG tablet  Commonly known as:  LIPITOR  Take 1 tablet (80 mg total) by mouth daily at 6 PM.     cloNIDine 0.2 MG tablet  Commonly known as:  CATAPRES  Take 0.2 mg by mouth daily.     clopidogrel 75 MG tablet  Commonly known as:  PLAVIX  Take 1 tablet (75 mg total) by mouth daily.     FLUoxetine 10 MG capsule  Commonly known as:  PROZAC  Take 10 mg by mouth daily.     gabapentin 300 MG capsule  Commonly known as:  NEURONTIN  Take 1 capsule (300 mg total) by mouth at bedtime as needed (For numbness and tingling).     insulin aspart 100 UNIT/ML FlexPen  Commonly known as:  NOVOLOG  Inject 0-18 Units into the skin 3 (three) times daily with meals.     Insulin Detemir 100 UNIT/ML Pen  Commonly known as:  LEVEMIR  Inject 60 Units into the skin at bedtime.     lisinopril-hydrochlorothiazide 20-12.5 MG tablet  Commonly known as:  PRINZIDE,ZESTORETIC  Take 1 tablet by mouth daily.     lithium 300 MG tablet  Take 300 mg by mouth 2 (two) times daily.     metoprolol succinate 25 MG 24 hr tablet  Commonly known as:  TOPROL-XL  Take 25 mg by mouth daily.     tiotropium 18 MCG inhalation capsule  Commonly known as:  SPIRIVA  Place 18 mcg into inhaler and inhale daily.     traZODone 100 MG tablet  Commonly known as:  DESYREL  Take 100 mg by mouth at bedtime.       Follow-up Information    Follow up with Carlynn Spry, NP. Schedule an appointment as soon as possible for a visit in 3 days.   Specialty:  Nurse Practitioner   Why:  To be seen with repeat labs (BMP).   Contact  information:   [address] [place] [address] [phone]       Follow up with [facility], MD. Schedule an appointment as soon as possible for a visit in 1 week.   Specialty:  Nephrology   Contact information:   [address] [address] [phone]       Schedule an appointment as soon as possible for a visit with [facility] (Psychiatry)/Dr. Wilhelmina Mcardle.   Why:  Call 05/25/2015 for medication refill and follow up.      Follow up with [facility], [facility], MD. Schedule an appointment as soon as possible for a visit in 1 month.   Specialty:  Neurology   Contact information:   [address]   [address] [phone]        The  results of significant diagnostics from this hospitalization (including imaging, microbiology, ancillary and laboratory) are listed below for reference.    Significant Diagnostic Studies: Ct Head Wo Contrast  05/22/2015  CLINICAL DATA:  Left-sided numbness and right eye visual changes EXAM: CT HEAD WITHOUT CONTRAST TECHNIQUE: Contiguous axial images were obtained from the base of the skull through the vertex without intravenous contrast. COMPARISON:  09/03/2014 FINDINGS: Bony calvarium is intact. No findings to suggest acute hemorrhage, acute infarction or space-occupying mass lesion are noted. IMPRESSION: No acute abnormality noted. Electronically Signed   By: Inez Catalina M.D.   On: 05/22/2015 12:40   Mr Brain Wo Contrast  05/22/2015  CLINICAL DATA:  Initial evaluation for right-sided visual loss with right-sided numbness. EXAM: MRI HEAD WITHOUT CONTRAST MRA HEAD WITHOUT CONTRAST TECHNIQUE: Multiplanar, multiecho pulse sequences of the brain and surrounding structures were obtained without intravenous contrast. Angiographic images of the head were obtained using MRA technique without contrast. COMPARISON:  Prior CT from earlier the same day. FINDINGS: MRI HEAD FINDINGS Study is degraded by motion artifact. Cerebral  volume within normal limits for patient age. Patchy T2/FLAIR hyperintensity within the periventricular white matter most consistent with chronic small vessel ischemic disease, mild in nature. Small remote lacunar infarct within the left thalamus. No abnormal foci of restricted diffusion to suggest acute intracranial infarct. Right vertebral artery is small and not well visualized. Major intracranial vascular flow voids are otherwise maintained. Gray-white matter differentiation preserved. No acute or chronic intracranial hemorrhage. No mass lesion, midline shift, or mass effect. No hydrocephalus. No extra-axial fluid collection. Craniocervical junction grossly normal, although evaluation somewhat limited due to motion artifact. Pituitary gland grossly normal. No acute abnormality about the orbits. Mild mucosal thickening within the paranasal sinuses. No air-fluid levels to suggest active sinus infection. Bilateral mastoid effusions present, right greater than left, likely benign in nature. Inner ear structures grossly normal. Bone marrow signal intensity within normal limits. No scalp soft tissue abnormality. MRA HEAD FINDINGS ANTERIOR CIRCULATION: Study degraded by motion artifact. Visualized distal cervical segments of the internal carotid arteries are patent with antegrade flow. Petrous, cavernous, and supraclinoid segments widely patent. Left A1 segment is hypoplastic but patent. Right A1 segment widely patent. Anterior communicating artery and anterior cerebral arteries well opacified and normal in appearance. M1 segments grossly patent, although evaluation limited by motion artifact. No definite stenosis. MCA bifurcations grossly normal. Distal MCA branches symmetric and well opacified bilaterally. POSTERIOR CIRCULATION: Left vertebral artery dominant and patent to the vertebrobasilar junction. Right vertebral artery not visualized, and may be occluded. Posterior inferior cerebral arteries not visualized on  this exam. Basilar artery widely patent. Superior cerebellar arteries patent proximally. Right PCA arises from the basilar artery. Multi focal irregularity with several moderate to severe stenoses within the right P2 segment present. Fetal origin of the left PCA suspected. The proximal left PCA is not well evaluated nor is the left posterior communicating artery due to motion artifact. Left PCA is opacified distally. IMPRESSION: MRI HEAD IMPRESSION: 1. No acute intracranial infarct or other process identified. 2. Small remote lacunar infarct within the left thalamus. 3. Mild chronic small vessel ischemic disease. MRA HEAD IMPRESSION: 1. Limited study due to motion artifact. Nonvisualization of the right vertebral artery, which may be occluded in the neck. Dominant left vertebral artery widely patent. 2. Multi focal moderate to severe stenoses within the proximal and mid right PCA. 3. Suspected fetal origin of the left PCA. Proximal and mid left PCA not well evaluated on this  exam due to motion, although the left PCA is opacified distally. 4. No definite stenosis within the anterior circulation. Electronically Signed   By: Jeannine Boga M.D.   On: 05/22/2015 21:50   Mr Jodene Nam Head/brain Wo Cm  05/22/2015  CLINICAL DATA:  Initial evaluation for right-sided visual loss with right-sided numbness. EXAM: MRI HEAD WITHOUT CONTRAST MRA HEAD WITHOUT CONTRAST TECHNIQUE: Multiplanar, multiecho pulse sequences of the brain and surrounding structures were obtained without intravenous contrast. Angiographic images of the head were obtained using MRA technique without contrast. COMPARISON:  Prior CT from earlier the same day. FINDINGS: MRI HEAD FINDINGS Study is degraded by motion artifact. Cerebral volume within normal limits for patient age. Patchy T2/FLAIR hyperintensity within the periventricular white matter most consistent with chronic small vessel ischemic disease, mild in nature. Small remote lacunar infarct within  the left thalamus. No abnormal foci of restricted diffusion to suggest acute intracranial infarct. Right vertebral artery is small and not well visualized. Major intracranial vascular flow voids are otherwise maintained. Gray-white matter differentiation preserved. No acute or chronic intracranial hemorrhage. No mass lesion, midline shift, or mass effect. No hydrocephalus. No extra-axial fluid collection. Craniocervical junction grossly normal, although evaluation somewhat limited due to motion artifact. Pituitary gland grossly normal. No acute abnormality about the orbits. Mild mucosal thickening within the paranasal sinuses. No air-fluid levels to suggest active sinus infection. Bilateral mastoid effusions present, right greater than left, likely benign in nature. Inner ear structures grossly normal. Bone marrow signal intensity within normal limits. No scalp soft tissue abnormality. MRA HEAD FINDINGS ANTERIOR CIRCULATION: Study degraded by motion artifact. Visualized distal cervical segments of the internal carotid arteries are patent with antegrade flow. Petrous, cavernous, and supraclinoid segments widely patent. Left A1 segment is hypoplastic but patent. Right A1 segment widely patent. Anterior communicating artery and anterior cerebral arteries well opacified and normal in appearance. M1 segments grossly patent, although evaluation limited by motion artifact. No definite stenosis. MCA bifurcations grossly normal. Distal MCA branches symmetric and well opacified bilaterally. POSTERIOR CIRCULATION: Left vertebral artery dominant and patent to the vertebrobasilar junction. Right vertebral artery not visualized, and may be occluded. Posterior inferior cerebral arteries not visualized on this exam. Basilar artery widely patent. Superior cerebellar arteries patent proximally. Right PCA arises from the basilar artery. Multi focal irregularity with several moderate to severe stenoses within the right P2 segment  present. Fetal origin of the left PCA suspected. The proximal left PCA is not well evaluated nor is the left posterior communicating artery due to motion artifact. Left PCA is opacified distally. IMPRESSION: MRI HEAD IMPRESSION: 1. No acute intracranial infarct or other process identified. 2. Small remote lacunar infarct within the left thalamus. 3. Mild chronic small vessel ischemic disease. MRA HEAD IMPRESSION: 1. Limited study due to motion artifact. Nonvisualization of the right vertebral artery, which may be occluded in the neck. Dominant left vertebral artery widely patent. 2. Multi focal moderate to severe stenoses within the proximal and mid right PCA. 3. Suspected fetal origin of the left PCA. Proximal and mid left PCA not well evaluated on this exam due to motion, although the left PCA is opacified distally. 4. No definite stenosis within the anterior circulation. Electronically Signed   By: Jeannine Boga M.D.   On: 05/22/2015 21:50    Microbiology: No results found for this or any previous visit (from the past 240 hour(s)).   Labs: Basic Metabolic Panel:  Recent Labs Lab 05/22/15 1209 05/22/15 1224 05/22/15 1859 05/23/15 1012 05/24/15 1706  NA 136 137  --  131* 135  K 3.6 3.6  --  4.0 3.6  CL 105 103  --  102 104  CO2 24  --   --  22 25  GLUCOSE 310* 305*  --  388* 242*  BUN 19 17  --  17 17  CREATININE 1.75* 1.60* 1.70* 1.90* 1.85*  CALCIUM 8.1*  --   --  8.0* 8.3*   Liver Function Tests:  Recent Labs Lab 05/22/15 1209  AST 21  ALT 17  ALKPHOS 50  BILITOT 0.4  PROT 5.3*  ALBUMIN 1.9*   No results for input(s): LIPASE, AMYLASE in the last 168 hours. No results for input(s): AMMONIA in the last 168 hours. CBC:  Recent Labs Lab 05/22/15 1209 05/22/15 1224 05/22/15 1859  WBC 8.9  --  11.5*  NEUTROABS 5.8  --   --   HGB 12.2 12.6 14.0  HCT 36.4 37.0 41.9  MCV 76.0*  --  76.3*  PLT 278  --  293   Cardiac Enzymes:  Recent Labs Lab 05/22/15 1626   TROPONINI 0.03   BNP: BNP (last 3 results)  Recent Labs  04/12/15 0935  BNP 78.0    ProBNP (last 3 results) No results for input(s): PROBNP in the last 8760 hours.  CBG:  Recent Labs Lab 05/23/15 1635 05/23/15 2149 05/24/15 0636 05/24/15 1133 05/24/15 1645  GLUCAP 191* 190* 110* 182* 233*        Signed:  Vernell Leep, MD, FACP, FHM. [facility] Pager [phone]  If 7PM-7AM, please contact night-coverage [email or web address] Password [facility] 05/24/2015, 5:46 PM

## 2015-05-24 NOTE — Progress Notes (Signed)
STROKE TEAM PROGRESS NOTE   HISTORY Samantha Pierce is an 44 y.o. female with a past medical history pertinent for HTN ,DM, CKD stage 3, left thalamic infarct 4/16, depression, and COPD who was in her usual state of health until this 2 days ago when she was at work and developed acute onset of painless visual loss right eye associated with speech impediment, right arm numbness and " some hurting of the right leg". Stated that the episode lased 10 minutes and resolved but throughout it she has " dazed".  Mrs. Meader said that she did not seek immediate medical attention but subsequently presented to [facility] where she had a CT brain that did not show acute abnormality and therefore was transferred to [facility] for further investigations of likely TIA. MRI/MRA brain complete and personally reviewed: no evidence of acute abnormality ob MRI, but MRA reveals nonvisualization of the right vertebral artery, which may be occluded in the neck. Dominant left vertebral artery widely patent and multi focal moderate to severe stenoses within the proximal and mid right PCA.  She takes aspirin daily. Presently, she denies HA, vertigo, double vision, difficulty swallowing, focal weakness, slurred speech, language or vision impairment.  Date last known well: 05/21/15 Time last known well: noon tPA Given: no, symptoms resolved   SUBJECTIVE (INTERVAL HISTORY) No family members present. The patient has been followed by Dr. Merlene Laughter. She is feeling somewhat better today. The plan will be to treat with Plavix 75 mg daily and aspirin 81 mg daily for the next 3 months. After that she will continue on Plavix alone.   OBJECTIVE Temp:  [97.9 F (36.6 C)-99 F (37.2 C)] 98.2 F (36.8 C) (12/19 0647) Pulse Rate:  [59-81] 76 (12/19 0647) Cardiac Rhythm:  [-] Normal sinus rhythm (12/18 1900) Resp:  [16-18] 18 (12/19 0647) BP: (129-173)/(65-104) 160/80 mmHg (12/19 0647) SpO2:  [96 %-98 %] 98 % (12/19 0647)  CBC:  Recent  Labs Lab 05/22/15 1209 05/22/15 1224 05/22/15 1859  WBC 8.9  --  11.5*  NEUTROABS 5.8  --   --   HGB 12.2 12.6 14.0  HCT 36.4 37.0 41.9  MCV 76.0*  --  76.3*  PLT 278  --  0000000    Basic Metabolic Panel:  Recent Labs Lab 05/22/15 1209 05/22/15 1224 05/22/15 1859 05/23/15 1012  NA 136 137  --  131*  K 3.6 3.6  --  4.0  CL 105 103  --  102  CO2 24  --   --  22  GLUCOSE 310* 305*  --  388*  BUN 19 17  --  17  CREATININE 1.75* 1.60* 1.70* 1.90*  CALCIUM 8.1*  --   --  8.0*    Lipid Panel:    Component Value Date/Time   CHOL 245* 05/23/2015 0545   TRIG 278* 05/23/2015 0545   HDL 45 05/23/2015 0545   CHOLHDL 5.4 05/23/2015 0545   VLDL 56* 05/23/2015 0545   LDLCALC 144* 05/23/2015 0545   HgbA1c:  Lab Results  Component Value Date   HGBA1C 10.0* 09/04/2014   Urine Drug Screen:    Component Value Date/Time   LABOPIA NONE DETECTED 05/22/2015 1245   COCAINSCRNUR NONE DETECTED 05/22/2015 1245   LABBENZ NONE DETECTED 05/22/2015 1245   AMPHETMU NONE DETECTED 05/22/2015 1245   THCU NONE DETECTED 05/22/2015 1245   LABBARB NONE DETECTED 05/22/2015 1245      IMAGING  Ct Head Wo Contrast 05/22/2015   No acute abnormality noted.  Mr Brain Wo Contrast 05/22/2015    MRI HEAD  1. No acute intracranial infarct or other process identified.  2. Small remote lacunar infarct within the left thalamus.  3. Mild chronic small vessel ischemic disease.   MRA HEAD 1. Limited study due to motion artifact. Nonvisualization of the right vertebral artery, which may be occluded in the neck. Dominant left vertebral artery widely patent.  2. Multi focal moderate to severe stenoses within the proximal and mid right PCA.  3.  Suspected fetal origin of the left PCA. Proximal and mid left PCA not well evaluated on this exam due to motion, although the left PCA is opacified distally.  4. No definite stenosis within the anterior circulation.      PHYSICAL EXAM Obese middle aged  lady not in distress. . Afebrile. Head is nontraumatic. Neck is supple without bruit.    Cardiac exam no murmur or gallop. Lungs are clear to auscultation. Distal pulses are well felt.  Neurological Exam ;  Awake  Alert oriented x 3. Normal speech and language.eye movements full without nystagmus.fundi were not visualized. Vision acuity and fields appear normal. Hearing is normal. Palatal movements are normal. Face symmetric. Tongue midline. Normal strength, tone, reflexes and coordination. Normal sensation. Gait deferred.   ASSESSMENT/PLAN Samantha Pierce is a 44 y.o. female with history of HTN ,DM, CKD stage 3, left thalamic infarct 4/16, depression, and COPD presenting with acute onset of painless visual loss right eye associated with speech impediment, right arm numbness and " some hurting of the right leg". Stated that the episode lased 10 minutes and resolved but throughout it she has " dazed".   She did not receive IV t-PA due to resolution of deficits.   TIA:  Non-Dominant likely etiology large vessel atherosclerosis  Resultant  resolution of deficits  MRI  no acute infarct  MRA  Multi focal moderate to severe stenoses within the proximal and mid right PCA.   Carotid Doppler  Intimal wall thickening CCA. Mild mixed plaque origin ICA. 1-39% ICA plaquing. Vertebral artery flow is antegrade.  2D Echo EF 60-65%. No cardiac source of emboli identified.  LDL 144  HgbA1c - 10.4  VTE prophylaxis - Lovenox  Diet heart healthy/carb modified Room service appropriate?: Yes; Fluid consistency:: Thin  aspirin 325 mg daily prior to admission, now on aspirin 81 mg daily and clopidogrel 75 mg daily  Patient counseled to be compliant with her antithrombotic medications  Ongoing aggressive stroke risk factor management  Therapy recommendations: Outpatient physical therapy recommended  Disposition: Pending  Hypertension  Stable Permissive hypertension (OK if < 220/120) but  gradually normalize in 5-7 days  Hyperlipidemia  Home meds:  Lipitor 40 mg daily not resumed in hospital  LDL 144, goal < 70  Lipitor increased to 80 mg daily  Continue statin at discharge  Diabetes  HgbA1c 10.4, goal < 7.0  Uncontrolled  Other Stroke Risk Factors  Cigarette smoker, quit smoking 13 months ago  Obesity, Body mass index is 47.18 kg/(m^2).   Hx stroke/TIA   Other Active Problems  Mild leukocytosis  Sodium 131  Hospital day #   Mikey Bussing PA-C [facility] Pager [phone] 05/24/2015, 5:48 PM  I have personally examined this patient, reviewed notes, independently viewed imaging studies, participated in medical decision making and plan of care. I have made any additions or clarifications directly to the above note. Agree with note above. She presented with left hemispheric TIA and remains at risk for  neurological worsening, recurrent TIA, stroke and needs ongoing evaluation and aggressive risk factor modification.Recommend aspirin and plavix for 3 months followed by plavix alone for stroke prevention.  Antony Contras, MD Medical Director [facility] Pager: [phone] 05/24/2015 8:09 PM    To contact Stroke Continuity provider, please refer to [email or web address] After hours, contact General Neurology

## 2015-05-24 NOTE — Discharge Instructions (Signed)
Stroke Prevention Some medical conditions and behaviors are associated with an increased chance of having a stroke. You may prevent a stroke by making healthy choices and managing medical conditions. HOW CAN I REDUCE MY RISK OF HAVING A STROKE?   Stay physically active. Get at least 30 minutes of activity on most or all days.  Do not smoke. It may also be helpful to avoid exposure to secondhand smoke.  Limit alcohol use. Moderate alcohol use is considered to be:  No more than 2 drinks per day for men.  No more than 1 drink per day for nonpregnant women.  Eat healthy foods. This involves:  Eating 5 or more servings of fruits and vegetables a day.  Making dietary changes that address high blood pressure (hypertension), high cholesterol, diabetes, or obesity.  Manage your cholesterol levels.  Making food choices that are high in fiber and low in saturated fat, trans fat, and cholesterol may control cholesterol levels.  Take any prescribed medicines to control cholesterol as directed by your health care provider.  Manage your diabetes.  Controlling your carbohydrate and sugar intake is recommended to manage diabetes.  Take any prescribed medicines to control diabetes as directed by your health care provider.  Control your hypertension.  Making food choices that are low in salt (sodium), saturated fat, trans fat, and cholesterol is recommended to manage hypertension.  Ask your health care provider if you need treatment to lower your blood pressure. Take any prescribed medicines to control hypertension as directed by your health care provider.  If you are 18-39 years of age, have your blood pressure checked every 3-5 years. If you are 40 years of age or older, have your blood pressure checked every year.  Maintain a healthy weight.  Reducing calorie intake and making food choices that are low in sodium, saturated fat, trans fat, and cholesterol are recommended to manage  weight.  Stop drug abuse.  Avoid taking birth control pills.  Talk to your health care provider about the risks of taking birth control pills if you are over 35 years old, smoke, get migraines, or have ever had a blood clot.  Get evaluated for sleep disorders (sleep apnea).  Talk to your health care provider about getting a sleep evaluation if you snore a lot or have excessive sleepiness.  Take medicines only as directed by your health care provider.  For some people, aspirin or blood thinners (anticoagulants) are helpful in reducing the risk of forming abnormal blood clots that can lead to stroke. If you have the irregular heart rhythm of atrial fibrillation, you should be on a blood thinner unless there is a good reason you cannot take them.  Understand all your medicine instructions.  Make sure that other conditions (such as anemia or atherosclerosis) are addressed. SEEK IMMEDIATE MEDICAL CARE IF:   You have sudden weakness or numbness of the face, arm, or leg, especially on one side of the body.  Your face or eyelid droops to one side.  You have sudden confusion.  You have trouble speaking (aphasia) or understanding.  You have sudden trouble seeing in one or both eyes.  You have sudden trouble walking.  You have dizziness.  You have a loss of balance or coordination.  You have a sudden, severe headache with no known cause.  You have new chest pain or an irregular heartbeat. Any of these symptoms may represent a serious problem that is an emergency. Do not wait to see if the symptoms will   go away. Get medical help at once. Call your local emergency services (911 in [place] Do not drive yourself to the hospital.   This information is not intended to replace advice given to you by your health care provider. Make sure you discuss any questions you have with your health care provider.   Document Released: 06/29/2004 Document Revised: 06/12/2014 Document Reviewed:  11/22/2012 Elsevier Interactive Patient Education 2016 [employer]  

## 2015-05-24 NOTE — Progress Notes (Signed)
Occupational Therapy Treatment Patient Details Name: Samantha Pierce MRN: [id number] DOB: 1971/01/17 Today's Date: 05/24/2015    History of present illness Pt is a 44 y/o female with a PMH significant for L MCA CVA in 08/2014, DM with diabetic retinopathy, HTN, CKD. Pt presents to [facility] for loss of vision out of R eye that lasted ~15 minutes and severe numbness over right side of face and R arm the day before while she was working. 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 CT and MRI negative for acute findings.    OT comments  Pt progressing well and reports she is back to her baseline. Completed transfers and ADLs at supervision level with SPC. Provided pt with HEP and practiced theraband to increase strength and ROM in bilateral shoulders. All education completed and pt has no further questions. Pt with no further acute OT needs. OT signing off.   Follow Up Recommendations  No OT follow up;Supervision - Intermittent    Equipment Recommendations  Tub/shower bench;Other (comment) (Single point cane)    Recommendations for Other Services      Precautions / Restrictions Precautions Precautions: Fall Precaution Comments: Residual balance deficits from prior stroke.  Restrictions Weight Bearing Restrictions: No       Mobility Bed Mobility Overal bed mobility: Modified Independent             General bed mobility comments: Pt up in chair on OT arrival  Transfers Overall transfer level: Needs assistance Equipment used: Straight cane Transfers: Sit to/from Stand Sit to Stand: Supervision         General transfer comment: Supervision for safety. Educated pt on gradual descent onto surface and safe hand placement    Balance Overall balance assessment: Needs assistance Sitting-balance support: No upper extremity supported;Feet supported Sitting balance-Leahy Scale: Fair Sitting balance - Comments: may be due to  body habitus   Standing balance support: No upper extremity supported;During functional activity Standing balance-Leahy Scale: Fair Standing balance comment: Able to maintain balance without UE support for ~2 minutes while completing grooming tasks at sink. No LOB                   ADL Overall ADL's : Needs assistance/impaired     Grooming: Wash/dry Radiographer, therapeutic: Supervision/safety;Ambulation;Regular Toilet;Grab bars (SPC)   Toileting- Clothing Manipulation and Hygiene: Supervision/safety;Sit to/from stand   Tub/ Shower Transfer: Tub transfer;Supervision/safety;Ambulation;Grab bars [facility]) Tub/Shower Transfer Details (indicate cue type and reason): Attempted to increase height that pt needs to step over for tub transfer at home - pt still unable to clear this height. Pt reports she is not concerned about this and will get a shower seat at home. Functional mobility during ADLs: Supervision/safety;Cane General ADL Comments: Provided pt with HEP and theraband to increase strength and ROM in bilateral shoulders.      Vision                     Perception     Praxis      Cognition   Behavior During Therapy: [facility] for tasks assessed/performed Overall Cognitive Status: Within Functional Limits for tasks assessed                       Extremity/Trunk Assessment  Exercises     Shoulder Instructions       General Comments      Pertinent Vitals/ Pain       Pain Assessment: No/denies pain  Home Living                                          Prior Functioning/Environment              Frequency       Progress Toward Goals  OT Goals(current goals can now be found in the care plan section)  Progress towards OT goals: Goals met/education completed, patient discharged from OT  Acute Rehab OT Goals Patient Stated Goal: Home today OT Goal  Formulation: With patient Time For Goal Achievement: 05/30/15 Potential to Achieve Goals: Good ADL Goals Pt Will Perform Lower Body Dressing: with modified independence;sit to/from stand Additional ADL Goal #1: Pt will independently perform HEP for bilateral UEs with theraband to increase strength.  Plan All goals met and education completed, patient discharged from OT services    Co-evaluation                 End of Session Equipment Utilized During Treatment: Gait belt;Other (comment) [facility])   Activity Tolerance Patient tolerated treatment well   Patient Left in chair;with call bell/phone within reach   Nurse Communication Mobility status    Functional Assessment Tool Used: clinical judgment Functional Limitation: Self care Self Care Current Status (Y5638): At least 1 percent but less than 20 percent impaired, limited or restricted Self Care Goal Status (L3734): At least 1 percent but less than 20 percent impaired, limited or restricted Self Care Discharge Status [phone]): At least 1 percent but less than 20 percent impaired, limited or restricted   Time: 1157-2620 OT Time Calculation (min): 23 min  Charges: OT G-codes **NOT FOR INPATIENT CLASS** Functional Assessment Tool Used: clinical judgment Functional Limitation: Self care Self Care Current Status (B5597): At least 1 percent but less than 20 percent impaired, limited or restricted Self Care Goal Status (C1638): At least 1 percent but less than 20 percent impaired, limited or restricted Self Care Discharge Status [phone]): At least 1 percent but less than 20 percent impaired, limited or restricted OT General Charges $OT Visit: 1 Procedure OT Evaluation $Initial OT Evaluation Tier I: 1 Procedure OT Treatments $Self Care/Home Management : 23-37 mins  Redmond Baseman, OTR/L 05/24/2015, 3:16 PM

## 2015-06-01 ENCOUNTER — Ambulatory Visit (HOSPITAL_COMMUNITY): Admission: RE | Admit: 2015-06-01 | Payer: Medicaid Other | Source: Ambulatory Visit

## 2015-06-01 ENCOUNTER — Other Ambulatory Visit: Payer: Self-pay | Admitting: Adult Health

## 2015-06-01 ENCOUNTER — Encounter (HOSPITAL_COMMUNITY)
Admission: RE | Admit: 2015-06-01 | Discharge: 2015-06-01 | Disposition: A | Discharge status: Home / Self Care | Payer: Medicaid Other | Source: Ambulatory Visit | Attending: Cardiology | Admitting: Cardiology

## 2015-06-01 ENCOUNTER — Other Ambulatory Visit: Payer: Self-pay

## 2015-06-01 ENCOUNTER — Ambulatory Visit (HOSPITAL_COMMUNITY)
Admission: RE | Admit: 2015-06-01 | Discharge: 2015-06-01 | Disposition: A | Discharge status: Home / Self Care | Payer: Medicaid Other | Source: Ambulatory Visit | Attending: Cardiology | Admitting: Cardiology

## 2015-06-01 ENCOUNTER — Encounter (HOSPITAL_COMMUNITY): Payer: Self-pay | Admitting: Emergency Medicine

## 2015-06-01 ENCOUNTER — Observation Stay (HOSPITAL_COMMUNITY)
Admission: EM | Admit: 2015-06-01 | Discharge: 2015-06-02 | Disposition: A | Discharge status: Home / Self Care | Payer: Medicaid Other | Attending: Interventional Cardiology | Admitting: Interventional Cardiology

## 2015-06-01 ENCOUNTER — Ambulatory Visit (HOSPITAL_COMMUNITY): Admission: AD | Admit: 2015-06-01 | Payer: Self-pay | Admitting: Interventional Cardiology

## 2015-06-01 ENCOUNTER — Encounter (HOSPITAL_COMMUNITY): Payer: Medicaid Other

## 2015-06-01 ENCOUNTER — Encounter (HOSPITAL_COMMUNITY)
Admission: EM | Disposition: A | Discharge status: Home / Self Care | Payer: Self-pay | Source: Home / Self Care | Attending: Interventional Cardiology

## 2015-06-01 ENCOUNTER — Emergency Department (HOSPITAL_COMMUNITY): Payer: Medicaid Other

## 2015-06-01 DIAGNOSIS — Z6841 Body Mass Index (BMI) 40.0 and over, adult: Secondary | ICD-10-CM | POA: Insufficient documentation

## 2015-06-01 DIAGNOSIS — R072 Precordial pain: Secondary | ICD-10-CM | POA: Insufficient documentation

## 2015-06-01 DIAGNOSIS — Z794 Long term (current) use of insulin: Secondary | ICD-10-CM | POA: Insufficient documentation

## 2015-06-01 DIAGNOSIS — R079 Chest pain, unspecified: Secondary | ICD-10-CM | POA: Diagnosis not present

## 2015-06-01 DIAGNOSIS — E1122 Type 2 diabetes mellitus with diabetic chronic kidney disease: Secondary | ICD-10-CM | POA: Diagnosis not present

## 2015-06-01 DIAGNOSIS — Z7902 Long term (current) use of antithrombotics/antiplatelets: Secondary | ICD-10-CM | POA: Diagnosis not present

## 2015-06-01 DIAGNOSIS — Z7982 Long term (current) use of aspirin: Secondary | ICD-10-CM | POA: Diagnosis not present

## 2015-06-01 DIAGNOSIS — N183 Chronic kidney disease, stage 3 unspecified: Secondary | ICD-10-CM | POA: Diagnosis present

## 2015-06-01 DIAGNOSIS — E11319 Type 2 diabetes mellitus with unspecified diabetic retinopathy without macular edema: Secondary | ICD-10-CM | POA: Diagnosis not present

## 2015-06-01 DIAGNOSIS — J449 Chronic obstructive pulmonary disease, unspecified: Secondary | ICD-10-CM | POA: Diagnosis not present

## 2015-06-01 DIAGNOSIS — Z79899 Other long term (current) drug therapy: Secondary | ICD-10-CM | POA: Insufficient documentation

## 2015-06-01 DIAGNOSIS — F329 Major depressive disorder, single episode, unspecified: Secondary | ICD-10-CM | POA: Insufficient documentation

## 2015-06-01 DIAGNOSIS — I2510 Atherosclerotic heart disease of native coronary artery without angina pectoris: Secondary | ICD-10-CM | POA: Diagnosis not present

## 2015-06-01 DIAGNOSIS — Z8673 Personal history of transient ischemic attack (TIA), and cerebral infarction without residual deficits: Secondary | ICD-10-CM | POA: Diagnosis not present

## 2015-06-01 DIAGNOSIS — R0789 Other chest pain: Secondary | ICD-10-CM | POA: Diagnosis not present

## 2015-06-01 DIAGNOSIS — I469 Cardiac arrest, cause unspecified: Secondary | ICD-10-CM | POA: Diagnosis not present

## 2015-06-01 DIAGNOSIS — I129 Hypertensive chronic kidney disease with stage 1 through stage 4 chronic kidney disease, or unspecified chronic kidney disease: Secondary | ICD-10-CM | POA: Diagnosis not present

## 2015-06-01 DIAGNOSIS — E785 Hyperlipidemia, unspecified: Secondary | ICD-10-CM | POA: Insufficient documentation

## 2015-06-01 DIAGNOSIS — I10 Essential (primary) hypertension: Secondary | ICD-10-CM | POA: Diagnosis not present

## 2015-06-01 DIAGNOSIS — R001 Bradycardia, unspecified: Secondary | ICD-10-CM | POA: Diagnosis not present

## 2015-06-01 DIAGNOSIS — Z87891 Personal history of nicotine dependence: Secondary | ICD-10-CM | POA: Insufficient documentation

## 2015-06-01 HISTORY — DX: Atherosclerotic heart disease of native coronary artery without angina pectoris: I25.10

## 2015-06-01 HISTORY — DX: Cardiac arrest, cause unspecified: I46.9

## 2015-06-01 HISTORY — DX: Morbid (severe) obesity due to excess calories: E66.01

## 2015-06-01 HISTORY — DX: Transient cerebral ischemic attack, unspecified: G45.9

## 2015-06-01 HISTORY — PX: CARDIAC CATHETERIZATION: SHX172

## 2015-06-01 LAB — CBC
HCT: 37.8 % (ref 36.0–46.0)
Hemoglobin: 11.9 g/dL — ABNORMAL LOW (ref 12.0–15.0)
MCH: 24.6 pg — ABNORMAL LOW (ref 26.0–34.0)
MCHC: 31.5 g/dL (ref 30.0–36.0)
MCV: 78.1 fL (ref 78.0–100.0)
Platelets: 335 10*3/uL (ref 150–400)
RBC: 4.84 MIL/uL (ref 3.87–5.11)
RDW: 13.7 % (ref 11.5–15.5)
WBC: 8.3 10*3/uL (ref 4.0–10.5)

## 2015-06-01 LAB — BASIC METABOLIC PANEL
Anion gap: 7 (ref 5–15)
BUN: 22 mg/dL — ABNORMAL HIGH (ref 6–20)
CO2: 24 mmol/L (ref 22–32)
Calcium: 8.7 mg/dL — ABNORMAL LOW (ref 8.9–10.3)
Chloride: 108 mmol/L (ref 101–111)
Creatinine, Ser: 1.8 mg/dL — ABNORMAL HIGH (ref 0.44–1.00)
GFR calc Af Amer: 38 mL/min — ABNORMAL LOW (ref >60)
GFR calc non Af Amer: 33 mL/min — ABNORMAL LOW (ref >60)
Glucose, Bld: 169 mg/dL — ABNORMAL HIGH (ref 65–99)
Potassium: 4.6 mmol/L (ref 3.5–5.1)
Sodium: 139 mmol/L (ref 135–145)

## 2015-06-01 LAB — COMPREHENSIVE METABOLIC PANEL
ALT: 17 U/L (ref 14–54)
AST: 16 U/L (ref 15–41)
Albumin: 2.2 g/dL — ABNORMAL LOW (ref 3.5–5.0)
Alkaline Phosphatase: 43 U/L (ref 38–126)
Anion gap: 6 (ref 5–15)
BUN: 25 mg/dL — ABNORMAL HIGH (ref 6–20)
CO2: 25 mmol/L (ref 22–32)
Calcium: 8.6 mg/dL — ABNORMAL LOW (ref 8.9–10.3)
Chloride: 107 mmol/L (ref 101–111)
Creatinine, Ser: 1.77 mg/dL — ABNORMAL HIGH (ref 0.44–1.00)
GFR calc Af Amer: 39 mL/min — ABNORMAL LOW (ref >60)
GFR calc non Af Amer: 34 mL/min — ABNORMAL LOW (ref >60)
Glucose, Bld: 172 mg/dL — ABNORMAL HIGH (ref 65–99)
Potassium: 4.2 mmol/L (ref 3.5–5.1)
Sodium: 138 mmol/L (ref 135–145)
Total Bilirubin: 0.3 mg/dL (ref 0.3–1.2)
Total Protein: 5.6 g/dL — ABNORMAL LOW (ref 6.5–8.1)

## 2015-06-01 LAB — CBC WITH DIFFERENTIAL/PLATELET
Basophils Absolute: 0 10*3/uL (ref 0.0–0.1)
Basophils Relative: 1 %
Eosinophils Absolute: 0.4 10*3/uL (ref 0.0–0.7)
Eosinophils Relative: 6 %
HCT: 40 % (ref 36.0–46.0)
Hemoglobin: 12.8 g/dL (ref 12.0–15.0)
Lymphocytes Relative: 30 %
Lymphs Abs: 1.9 10*3/uL (ref 0.7–4.0)
MCH: 25.1 pg — ABNORMAL LOW (ref 26.0–34.0)
MCHC: 32 g/dL (ref 30.0–36.0)
MCV: 78.4 fL (ref 78.0–100.0)
Monocytes Absolute: 0.6 10*3/uL (ref 0.1–1.0)
Monocytes Relative: 9 %
Neutro Abs: 3.6 10*3/uL (ref 1.7–7.7)
Neutrophils Relative %: 54 %
Platelets: 345 10*3/uL (ref 150–400)
RBC: 5.1 MIL/uL (ref 3.87–5.11)
RDW: 13.5 % (ref 11.5–15.5)
WBC: 6.5 10*3/uL (ref 4.0–10.5)

## 2015-06-01 LAB — PROTIME-INR
INR: 1 (ref 0.00–1.49)
INR: 0.97 (ref 0.00–1.49)
Prothrombin Time: 13.4 seconds (ref 11.6–15.2)
Prothrombin Time: 13.1 seconds (ref 11.6–15.2)

## 2015-06-01 LAB — GLUCOSE, CAPILLARY
Glucose-Capillary: 152 mg/dL — ABNORMAL HIGH (ref 65–99)
Glucose-Capillary: 139 mg/dL — ABNORMAL HIGH (ref 65–99)
Glucose-Capillary: 206 mg/dL — ABNORMAL HIGH (ref 65–99)

## 2015-06-01 LAB — MRSA PCR SCREENING: MRSA by PCR: NEGATIVE

## 2015-06-01 LAB — LITHIUM LEVEL: Lithium Lvl: <0.06 mmol/L — ABNORMAL LOW (ref 0.60–1.20)

## 2015-06-01 LAB — TROPONIN I: Troponin I: <0.03 ng/mL (ref <0.031)

## 2015-06-01 SURGERY — LEFT HEART CATH AND CORONARY ANGIOGRAPHY

## 2015-06-01 MED ORDER — SODIUM CHLORIDE 0.9 % IV SOLN: 250.0000 mL | INTRAVENOUS | Status: DC | PRN

## 2015-06-01 MED ORDER — INSULIN DETEMIR 100 UNIT/ML ~~LOC~~ SOLN
60.0000 [IU] | Freq: Every day | SUBCUTANEOUS | Status: DC
  Administered 2015-06-01: 60 [IU] via SUBCUTANEOUS
  Filled 2015-06-01 (×2): qty 0.6

## 2015-06-01 MED ORDER — REGADENOSON 0.4 MG/5ML IV SOLN
INTRAVENOUS | Status: AC
  Filled 2015-06-01: qty 5

## 2015-06-01 MED ORDER — TRAZODONE HCL 50 MG PO TABS
100.0000 mg | ORAL_TABLET | Freq: Every day | ORAL | Status: DC
  Administered 2015-06-01: 100 mg via ORAL
  Filled 2015-06-01: qty 2

## 2015-06-01 MED ORDER — SODIUM CHLORIDE 0.9 % IJ SOLN
INTRAMUSCULAR | Status: AC
  Filled 2015-06-01: qty 3

## 2015-06-01 MED ORDER — SODIUM CHLORIDE 0.9 % IJ SOLN: 3.0000 mL | INTRAMUSCULAR | Status: DC | PRN

## 2015-06-01 MED ORDER — ASPIRIN EC 81 MG PO TBEC
81.0000 mg | DELAYED_RELEASE_TABLET | Freq: Every day | ORAL | Status: DC
  Administered 2015-06-02: 81 mg via ORAL
  Filled 2015-06-01: qty 1

## 2015-06-01 MED ORDER — TIOTROPIUM BROMIDE MONOHYDRATE 18 MCG IN CAPS
18.0000 ug | ORAL_CAPSULE | Freq: Every day | RESPIRATORY_TRACT | Status: DC
  Administered 2015-06-02: 18 ug via RESPIRATORY_TRACT
  Filled 2015-06-01: qty 5

## 2015-06-01 MED ORDER — SODIUM CHLORIDE 0.9 % WEIGHT BASED INFUSION
3.0000 mL/kg/h | INTRAVENOUS | Status: DC
  Administered 2015-06-01: 3 mL/kg/h via INTRAVENOUS

## 2015-06-01 MED ORDER — HYDRALAZINE HCL 20 MG/ML IJ SOLN
INTRAMUSCULAR | Status: AC
  Filled 2015-06-01: qty 1

## 2015-06-01 MED ORDER — REGADENOSON 0.4 MG/5ML IV SOLN
INTRAVENOUS | Status: AC
  Administered 2015-06-01: 0.4 mg via INTRAVENOUS
  Filled 2015-06-01: qty 5

## 2015-06-01 MED ORDER — OXYCODONE-ACETAMINOPHEN 5-325 MG PO TABS: 1.0000 | ORAL_TABLET | ORAL | Status: DC | PRN

## 2015-06-01 MED ORDER — FLUOXETINE HCL 10 MG PO CAPS
10.0000 mg | ORAL_CAPSULE | Freq: Every day | ORAL | Status: DC
  Administered 2015-06-01 – 2015-06-02 (×2): 10 mg via ORAL
  Filled 2015-06-01 (×2): qty 1

## 2015-06-01 MED ORDER — MIDAZOLAM HCL 2 MG/2ML IJ SOLN
INTRAMUSCULAR | Status: AC
  Filled 2015-06-01: qty 2

## 2015-06-01 MED ORDER — FENTANYL CITRATE (PF) 100 MCG/2ML IJ SOLN
INTRAMUSCULAR | Status: AC
  Filled 2015-06-01: qty 2

## 2015-06-01 MED ORDER — IOHEXOL 350 MG/ML SOLN
INTRAVENOUS | Status: DC | PRN
  Administered 2015-06-01: 40 mL via INTRA_ARTERIAL

## 2015-06-01 MED ORDER — HEPARIN SODIUM (PORCINE) 1000 UNIT/ML IJ SOLN
INTRAMUSCULAR | Status: AC
  Filled 2015-06-01: qty 1

## 2015-06-01 MED ORDER — SODIUM CHLORIDE 0.9 % WEIGHT BASED INFUSION: 1.0000 mL/kg/h | INTRAVENOUS | Status: DC

## 2015-06-01 MED ORDER — INSULIN ASPART 100 UNIT/ML ~~LOC~~ SOLN: 0.0000 [IU] | Freq: Three times a day (TID) | SUBCUTANEOUS | Status: DC

## 2015-06-01 MED ORDER — HEPARIN SODIUM (PORCINE) 5000 UNIT/ML IJ SOLN
5000.0000 [IU] | Freq: Three times a day (TID) | INTRAMUSCULAR | Status: DC
  Administered 2015-06-01 – 2015-06-02 (×2): 5000 [IU] via SUBCUTANEOUS
  Filled 2015-06-01 (×2): qty 1

## 2015-06-01 MED ORDER — SODIUM CHLORIDE 0.9 % IJ SOLN
INTRAMUSCULAR | Status: AC
  Administered 2015-06-01: 10 mL via INTRAVENOUS
  Filled 2015-06-01: qty 3

## 2015-06-01 MED ORDER — MIDAZOLAM HCL 2 MG/2ML IJ SOLN
INTRAMUSCULAR | Status: DC | PRN
  Administered 2015-06-01 (×2): 1 mg via INTRAVENOUS

## 2015-06-01 MED ORDER — TECHNETIUM TC 99M SESTAMIBI GENERIC - CARDIOLITE
30.0000 | Freq: Once | INTRAVENOUS | Status: AC | PRN
  Administered 2015-06-01: 30 via INTRAVENOUS

## 2015-06-01 MED ORDER — HEPARIN SODIUM (PORCINE) 1000 UNIT/ML IJ SOLN
INTRAMUSCULAR | Status: DC | PRN
  Administered 2015-06-01: 6000 [IU] via INTRAVENOUS

## 2015-06-01 MED ORDER — GABAPENTIN 300 MG PO CAPS: 300.0000 mg | ORAL_CAPSULE | Freq: Every evening | ORAL | Status: DC | PRN

## 2015-06-01 MED ORDER — CLOPIDOGREL BISULFATE 75 MG PO TABS
75.0000 mg | ORAL_TABLET | Freq: Every day | ORAL | Status: DC
  Administered 2015-06-02: 75 mg via ORAL
  Filled 2015-06-01: qty 1

## 2015-06-01 MED ORDER — LISINOPRIL 20 MG PO TABS
20.0000 mg | ORAL_TABLET | Freq: Every day | ORAL | Status: DC
  Filled 2015-06-01: qty 1

## 2015-06-01 MED ORDER — ONDANSETRON HCL 4 MG/2ML IJ SOLN
4.0000 mg | Freq: Four times a day (QID) | INTRAMUSCULAR | Status: DC | PRN
  Administered 2015-06-01: 4 mg via INTRAVENOUS
  Filled 2015-06-01: qty 2

## 2015-06-01 MED ORDER — VERAPAMIL HCL 2.5 MG/ML IV SOLN
INTRAVENOUS | Status: AC
  Filled 2015-06-01: qty 2

## 2015-06-01 MED ORDER — ATORVASTATIN CALCIUM 80 MG PO TABS
80.0000 mg | ORAL_TABLET | Freq: Every day | ORAL | Status: DC
  Administered 2015-06-01: 80 mg via ORAL
  Filled 2015-06-01: qty 1

## 2015-06-01 MED ORDER — HYDROCHLOROTHIAZIDE 12.5 MG PO CAPS
12.5000 mg | ORAL_CAPSULE | Freq: Every day | ORAL | Status: DC
  Filled 2015-06-01: qty 1

## 2015-06-01 MED ORDER — SODIUM CHLORIDE 0.9 % IV SOLN: INTRAVENOUS | Status: AC

## 2015-06-01 MED ORDER — FENTANYL CITRATE (PF) 100 MCG/2ML IJ SOLN
INTRAMUSCULAR | Status: DC | PRN
  Administered 2015-06-01 (×2): 50 ug via INTRAVENOUS

## 2015-06-01 MED ORDER — ASPIRIN 81 MG PO CHEW: 81.0000 mg | CHEWABLE_TABLET | ORAL | Status: DC

## 2015-06-01 MED ORDER — HEPARIN (PORCINE) IN NACL 2-0.9 UNIT/ML-% IJ SOLN
INTRAMUSCULAR | Status: AC
  Filled 2015-06-01: qty 1000

## 2015-06-01 MED ORDER — SODIUM CHLORIDE 0.9 % IJ SOLN
3.0000 mL | Freq: Two times a day (BID) | INTRAMUSCULAR | Status: DC
  Administered 2015-06-01: 3 mL via INTRAVENOUS

## 2015-06-01 MED ORDER — INSULIN ASPART 100 UNIT/ML FLEXPEN: 0.0000 [IU] | PEN_INJECTOR | Freq: Three times a day (TID) | SUBCUTANEOUS | Status: DC

## 2015-06-01 MED ORDER — HYDRALAZINE HCL 20 MG/ML IJ SOLN
10.0000 mg | Freq: Once | INTRAMUSCULAR | Status: AC | PRN
  Administered 2015-06-01: 10 mg via INTRAVENOUS

## 2015-06-01 MED ORDER — METOPROLOL SUCCINATE ER 25 MG PO TB24: 25.0000 mg | ORAL_TABLET | Freq: Every day | ORAL | Status: DC

## 2015-06-01 MED ORDER — SODIUM CHLORIDE 0.9 % IJ SOLN
3.0000 mL | Freq: Two times a day (BID) | INTRAMUSCULAR | Status: DC
  Administered 2015-06-02: 3 mL via INTRAVENOUS

## 2015-06-01 MED ORDER — SODIUM CHLORIDE 0.9 % WEIGHT BASED INFUSION: 1.0000 mL/kg/h | INTRAVENOUS | Status: AC

## 2015-06-01 MED ORDER — LIDOCAINE HCL (PF) 1 % IJ SOLN
INTRAMUSCULAR | Status: AC
  Filled 2015-06-01: qty 30

## 2015-06-01 MED ORDER — INSULIN DETEMIR 100 UNIT/ML FLEXPEN: 60.0000 [IU] | PEN_INJECTOR | Freq: Every day | SUBCUTANEOUS | Status: DC

## 2015-06-01 MED ORDER — LISINOPRIL-HYDROCHLOROTHIAZIDE 20-12.5 MG PO TABS: 1.0000 | ORAL_TABLET | Freq: Every day | ORAL | Status: DC

## 2015-06-01 MED ORDER — ALBUTEROL SULFATE (2.5 MG/3ML) 0.083% IN NEBU: 3.0000 mL | INHALATION_SOLUTION | Freq: Four times a day (QID) | RESPIRATORY_TRACT | Status: DC | PRN

## 2015-06-01 MED ORDER — CLONIDINE HCL 0.1 MG PO TABS
0.2000 mg | ORAL_TABLET | Freq: Every day | ORAL | Status: DC
  Administered 2015-06-01 – 2015-06-02 (×2): 0.2 mg via ORAL
  Filled 2015-06-01 (×2): qty 2

## 2015-06-01 MED ORDER — VERAPAMIL HCL 2.5 MG/ML IV SOLN
INTRAVENOUS | Status: DC | PRN
  Administered 2015-06-01: 15:00:00 via INTRA_ARTERIAL

## 2015-06-01 MED ORDER — LIDOCAINE HCL (PF) 1 % IJ SOLN
INTRAMUSCULAR | Status: DC | PRN
  Administered 2015-06-01: 16:00:00

## 2015-06-01 MED ORDER — ACETAMINOPHEN 325 MG PO TABS
650.0000 mg | ORAL_TABLET | ORAL | Status: DC | PRN
  Administered 2015-06-02: 650 mg via ORAL
  Filled 2015-06-01: qty 2

## 2015-06-01 SURGICAL SUPPLY — 10 items
CATH INFINITI 5 FR JL3.5 (CATHETERS) ×2 IMPLANT
CATH INFINITI JR4 5F (CATHETERS) ×2 IMPLANT
DEVICE RAD COMP TR BAND LRG (VASCULAR PRODUCTS) ×2 IMPLANT
GLIDESHEATH SLEND A-KIT 6F 22G (SHEATH) ×2 IMPLANT
KIT HEART LEFT (KITS) ×2 IMPLANT
PACK CARDIAC CATHETERIZATION (CUSTOM PROCEDURE TRAY) ×2 IMPLANT
TRANSDUCER W/STOPCOCK (MISCELLANEOUS) ×2 IMPLANT
TUBING CIL FLEX 10 FLL-RA (TUBING) ×2 IMPLANT
WIRE HI TORQ VERSACORE-J 145CM (WIRE) ×2 IMPLANT
WIRE SAFE-T 1.5MM-J .035X260CM (WIRE) ×2 IMPLANT

## 2015-06-01 NOTE — Interval H&P Note (Signed)
Cath Lab Visit (complete for each Cath Lab visit)  Clinical Evaluation Leading to the Procedure:   ACS: Yes.    Non-ACS:    Anginal Classification: CCS III  Anti-ischemic medical therapy: Maximal Therapy (2 or more classes of medications)  Non-Invasive Test Results: High-risk stress test findings: cardiac mortality >3%/year  Prior CABG: No previous CABG      History and Physical Interval Note:  06/01/2015 1:46 PM  The patient had brady/Asystolic cardiac arrest during adenosine infusion. CPR was required. Sent here for cath as a response to that clinical issue.  Samantha Pierce  has presented today for surgery, with the diagnosis of cp  The various methods of treatment have been discussed with the patient and family. After consideration of risks, benefits and other options for treatment, the patient has consented to  Procedure(s): Left Heart Cath and Coronary Angiography (N/A) as a surgical intervention .  The patient's history has been reviewed, patient examined, no change in status, stable for surgery.  I have reviewed the patient's chart and labs.  Questions were answered to the patient's satisfaction.     Samantha Pierce

## 2015-06-01 NOTE — ED Notes (Signed)
Carelink left at this time.

## 2015-06-01 NOTE — H&P (Signed)
Primary Physician: Carlynn Spry, NP Primary Cardiologist:  Domenic Polite   HPI: Pt a 44 yo with long history of DM (28 yrs) , poorly controlled HTN, lacunar infarct (09/2014)  She presented today for Samantha Pierce  She was having L sided Chest pains The pt started testing and at 1 min developed bradycardia and asystolie.  Lasted about 1.5 min Coade called  CPR begun and she regained conscioussns  Herat rhythm was sinus  BP 149/8- Pt stabilized  Complained of chest tightness.  Plan for admit to [facility] heart cath       Past Medical History  Diagnosis Date  . Essential hypertension   . History of blood transfusion   . Diabetes mellitus with diabetic retinopathy   . Depression   . Anemia   . CKD (chronic kidney disease) stage 3, GFR 30-59 ml/min   . COPD (chronic obstructive pulmonary disease) ([place])   . History of stroke April 2016    Acute lacunar infarct of the left thalamus  . TIA (transient ischemic attack)     Medications Prior to Admission  Medication Sig Dispense Refill  . albuterol (PROVENTIL HFA;VENTOLIN HFA) 108 (90 BASE) MCG/ACT inhaler Inhale 1-2 puffs into the lungs every 6 (six) hours as needed for wheezing or shortness of breath. 1 Inhaler 0  . aspirin EC 81 MG EC tablet Take 1 tablet (81 mg total) by mouth daily. 30 tablet 0  . atorvastatin (LIPITOR) 80 MG tablet Take 1 tablet (80 mg total) by mouth daily at 6 PM. 30 tablet 0  . cloNIDine (CATAPRES) 0.2 MG tablet Take 0.2 mg by mouth daily.    . clopidogrel (PLAVIX) 75 MG tablet Take 1 tablet (75 mg total) by mouth daily. 30 tablet 0  . FLUoxetine (PROZAC) 10 MG capsule Take 10 mg by mouth daily.    Marland Kitchen gabapentin (NEURONTIN) 300 MG capsule Take 1 capsule (300 mg total) by mouth at bedtime as needed (For numbness and tingling).    . insulin aspart (NOVOLOG) 100 UNIT/ML FlexPen Inject 0-18 Units into the skin 3 (three) times daily with meals.    . Insulin Detemir (LEVEMIR) 100 UNIT/ML Pen Inject 60 Units into  the skin at bedtime.    Marland Kitchen lisinopril-hydrochlorothiazide (PRINZIDE,ZESTORETIC) 20-12.5 MG per tablet Take 1 tablet by mouth daily.    . metoprolol succinate (TOPROL-XL) 25 MG 24 hr tablet Take 25 mg by mouth daily.    Marland Kitchen tiotropium (SPIRIVA) 18 MCG inhalation capsule Place 18 mcg into inhaler and inhale daily.    . traZODone (DESYREL) 100 MG tablet Take 100 mg by mouth at bedtime.       Derrill Memo ON 06/02/2015] aspirin  81 mg Oral Pre-Cath  . sodium chloride  3 mL Intravenous Q12H    Infusions: . [START ON 06/02/2015] sodium chloride     Followed by  . [START ON 06/02/2015] sodium chloride      Allergies  Allergen Reactions  . Penicillins Rash    Has patient had a PCN reaction causing immediate rash, facial/tongue/throat swelling, SOB or lightheadedness with hypotension: Yes Has patient had a PCN reaction causing severe rash involving mucus membranes or skin necrosis: No Has patient had a PCN reaction that required hospitalization No Has patient had a PCN reaction occurring within the last 10 years: No If all of the above answers are "NO", then may proceed with Cephalosporin use.     Social History   Social History  . Marital Status: Single  Spouse Name: N/A  . Number of Children: N/A  . Years of Education: N/A   Occupational History  . Not on file.   Social History Main Topics  . Smoking status: Former Smoker -- 0.03 packs/day for 30 years    Types: Cigarettes    Start date: 06/05/1981    Quit date: 04/04/2014  . Smokeless tobacco: Never Used  . Alcohol Use: No  . Drug Use: No  . Sexual Activity: Yes    Birth Control/ Protection: Surgical   Other Topics Concern  . Not on file   Social History Narrative    Family History  Problem Relation Age of Onset  . Diabetes Mother     REVIEW OF SYSTEMS:  All systems reviewed  Negative to the above problem except as noted above.    PHYSICAL EXAM: Filed Vitals:   06/01/15 1200 06/01/15 1207  BP:  158/73  Pulse:     Temp:    Resp: 17 12    No intake or output data in the 24 hours ending 06/01/15 1214  General: Obese 44 yo who still complains of chest tightness HEENT: normal Neck: supple. no JVD. Carotids 2+ bilat; no bruits. No lymphadenopathy or thryomegaly appreciated. Cor: PMI nondisplaced. Regular rate & rhythm. No rubs, gallops or murmurs. Lungs: clear Abdomen: soft, nontender, nondistended. No hepatosplenomegaly. No bruits or masses. Good bowel sounds. Extremities: no cyanosis, clubbing, rash, edema Neuro: alert & oriented x 3, cranial nerves grossly intact. moves all 4 extremities w/o difficulty. Affect pleasant.  ECG:  SR    Results for orders placed or performed during the hospital encounter of 06/01/15 (from the past 24 hour(s))  CBC with Differential/Platelet     Status: Abnormal   Collection Time: 06/01/15  9:45 AM  Result Value Ref Range   WBC 6.5 4.0 - 10.5 K/uL   RBC 5.10 3.87 - 5.11 MIL/uL   Hemoglobin 12.8 12.0 - 15.0 g/dL   HCT 40.0 36.0 - 46.0 %   MCV 78.4 78.0 - 100.0 fL   MCH 25.1 (L) 26.0 - 34.0 pg   MCHC 32.0 30.0 - 36.0 g/dL   RDW 13.5 11.5 - 15.5 %   Platelets 345 150 - 400 K/uL   Neutrophils Relative % 54 %   Neutro Abs 3.6 1.7 - 7.7 K/uL   Lymphocytes Relative 30 %   Lymphs Abs 1.9 0.7 - 4.0 K/uL   Monocytes Relative 9 %   Monocytes Absolute 0.6 0.1 - 1.0 K/uL   Eosinophils Relative 6 %   Eosinophils Absolute 0.4 0.0 - 0.7 K/uL   Basophils Relative 1 %   Basophils Absolute 0.0 0.0 - 0.1 K/uL  Comprehensive metabolic panel     Status: Abnormal   Collection Time: 06/01/15  9:45 AM  Result Value Ref Range   Sodium 138 135 - 145 mmol/L   Potassium 4.2 3.5 - 5.1 mmol/L   Chloride 107 101 - 111 mmol/L   CO2 25 22 - 32 mmol/L   Glucose, Bld 172 (H) 65 - 99 mg/dL   BUN 25 (H) 6 - 20 mg/dL   Creatinine, Ser 1.77 (H) 0.44 - 1.00 mg/dL   Calcium 8.6 (L) 8.9 - 10.3 mg/dL   Total Protein 5.6 (L) 6.5 - 8.1 g/dL   Albumin 2.2 (L) 3.5 - 5.0 g/dL   AST 16 15 - 41  U/L   ALT 17 14 - 54 U/L   Alkaline Phosphatase 43 38 - 126 U/L   Total Bilirubin 0.3  0.3 - 1.2 mg/dL   GFR calc non Af Amer 34 (L) >60 mL/min   GFR calc Af Amer 39 (L) >60 mL/min   Anion gap 6 5 - 15  Troponin I     Status: None   Collection Time: 06/01/15  9:45 AM  Result Value Ref Range   Troponin I <0.03 <0.031 ng/mL  Protime-INR     Status: None   Collection Time: 06/01/15  9:45 AM  Result Value Ref Range   Prothrombin Time 13.4 11.6 - 15.2 seconds   INR 1.00 0.00 - 1.49  Lithium level     Status: Abnormal   Collection Time: 06/01/15  9:45 AM  Result Value Ref Range   Lithium Lvl <0.06 (L) 0.60 - 1.20 mmol/L   Dg Chest Portable 1 View  06/01/2015  CLINICAL DATA:  44 year old female with chest pain which occurred during a cardiac stress test EXAM: PORTABLE CHEST 1 VIEW COMPARISON:  Most recent prior chest x-ray 04/12/15 FINDINGS: Cardiac mediastinal contours are within normal limits. Inspiratory volumes are low but the lungs remain clear. No pleural effusion or pneumothorax. No acute osseous abnormality. IMPRESSION: No active disease. Electronically Signed   By: Jacqulynn Cadet M.D.   On: 06/01/2015 09:57     ASSESSMENT:  44 yo with no known CAD but multiple risk factors   Today during [place] had prolonged episode of asystole  Concerning for how pronounced it was   Given this and continued chest tightness I would recomm that the pt be transferred to [facility] for L heart catheterization.  Discussed with pt and she agrees to proceed  2.  DM  Watch glucose    3  HL  Continue lipitor 80  4  HTN  WIll need to follow    5  DM  Follow glu and dose accordingly

## 2015-06-01 NOTE — Research (Signed)
[place] Study Informed Consent   Subject Name: Samantha Pierce  Subject met inclusion and exclusion criteria.  The informed consent form, study requirements and expectations were reviewed with the subject and questions and concerns were addressed prior to the signing of the consent form.  The subject verbalized understanding of the trial requirements.  The subject agreed to participate in the [place] trial and signed the informed consent.  The informed consent was obtained prior to performance of any protocol-specific procedures for the subject.  A copy of the signed informed consent was given to the subject and a copy was placed in the subject's medical record.  Desmond Dike H 06/01/2015, 14:10

## 2015-06-01 NOTE — H&P (Signed)
CARDIOLOGY HISTORY AND PHYSICAL   Patient ID: Samantha Pierce MRN: [id number]  DOB/AGE: [birth year] 44 y.o. Admit date: (Not on file)  Primary Care Physician: Carlynn Spry, NP Primary Cardiologist: Rozann Lesches Md  Clinical Summary Samantha Pierce is a 44 y.o.female with known history of poorly controlled hypertension, and hypertensive urgency acute lacunar infarct of the left thamlus in Ap[place] of 2016. She was planned for a 2 day stress lexiscan after being seen by Dr. Domenic Polite on office visit.  During stress test, one minute after injection of Lexiscan she had bradycardia and then asystole with lack of respirations and unconsciousness. Code was called as she remained in asystole for apporxiimately 2 minutes. CPR began but she spontaneously regained consciousness and spontaneously heart rhythm returning  to normal. BG glucose was checked and found to be 189. Code was cancelled. BP returned to normal at 149/80.   Patient was stabilized, Dr. Harrington Challenger was informed and saw her. It is recomnended that she go to [facility] for cardiac cath. She is transferred to ER, with notification of [facility]. Dr. Harrington Challenger spoke with Dr. Marlou Porch. She is to wait in ER for transport under cardiac monitoring.    Allergies  Allergen Reactions  . Penicillins Rash    Has patient had a PCN reaction causing immediate rash, facial/tongue/throat swelling, SOB or lightheadedness with hypotension: Yes Has patient had a PCN reaction causing severe rash involving mucus membranes or skin necrosis: No Has patient had a PCN reaction that required hospitalization No Has patient had a PCN reaction occurring within the last 10 years: No If all of the above answers are "NO", then may proceed with Cephalosporin use.     Home Medications  (Not in a hospital admission)  Scheduled Medications    Infusions    PRN Medications    Past Medical History  Diagnosis Date  . Essential hypertension   . History of blood  transfusion   . Diabetes mellitus with diabetic retinopathy   . Depression   . Anemia   . CKD (chronic kidney disease) stage 3, GFR 30-59 ml/min   . COPD (chronic obstructive pulmonary disease) ([place])   . History of stroke April 2016    Acute lacunar infarct of the left thalamus    Past Surgical History  Procedure Laterality Date  . Dilation and curettage of uterus    . Cesarean section      x2  . Tubal ligation    . Pars plana vitrectomy  04/20/2011    Procedure: PARS PLANA VITRECTOMY WITH 25 GAUGE;  Surgeon: Hayden Pedro, MD;  Location: [facility] [place];  Service: Ophthalmology;  Laterality: Left;  membrane peel, gas fluid exchange, endolaser, repair of complex retinal detachment  . Eye surgery      Right eye pars plano vitrectomy   . Lasik      Family History  Problem Relation Age of Onset  . Diabetes Mother     Social History Ms. Aberg reports that she quit smoking about 13 months ago. Her smoking use included Cigarettes. She started smoking about 34 years ago. She has a .9 pack-year smoking history. She has never used smokeless tobacco. Samantha Pierce reports that she does not drink alcohol.  Review of Systems Otherwise reviewed and negative except as outlined.  Physical Examination @VSRANGES @ @IOBRIEF @  Gen: No acute distress. HEENT: Conjunctiva and lids normal, oropharynx clear with moist mucosa. Neck: Supple, no elevated JVP or carotid bruits, no thyromegaly. Lungs: Clear to auscultation, nonlabored breathing  at rest. Cardiac: Regular rate and rhythm, no S3 or significant systolic murmur, no pericardial rub. Abdomen: Soft, nontender, no hepatomegaly, bowel sounds present, no guarding or rebound. Extremities: No pitting edema, distal pulses 2+. Skin: Warm and dry. Musculoskeletal: No kyphosis. Neuropsychiatric: Alert and oriented x3, affect grossly appropriate.   Lab Results  Basic Metabolic Panel: No results for input(s): NA, K, CL, CO2, GLUCOSE, BUN, CREATININE,  CALCIUM, MG, PHOS in the last 168 hours.  Liver Function Tests: No results for input(s): AST, ALT, ALKPHOS, BILITOT, PROT, ALBUMIN in the last 168 hours.  CBC: No results for input(s): WBC, NEUTROABS, HGB, HCT, MCV, PLT in the last 168 hours.  Cardiac Enzymes: No results for input(s): CKTOTAL, CKMB, CKMBINDEX, TROPONINI in the last 168 hours.  BNP: Invalid input(s): POCBNP   Radiology No results found.  Prior Cardiac Testing/Procedures:   ECG   Impression and Recommendations  1.Cardiac Arrest post infusion of Lexscan: No CPR provided  She spontaneously recovered NSR. No medications were given.  All medications are held until after study. CBC and BMET are drawn. Patient sent to ER.   Signed: Phill Myron. Lawrence NP [place]  06/01/2015, 9:14 AM Co-Sign MD Pt seen and examined  I have done another dictation.  Please see additional H and P

## 2015-06-01 NOTE — ED Notes (Signed)
Report given to Camila Li, RN for room [id number].

## 2015-06-01 NOTE — ED Provider Notes (Signed)
CSN: [id number]     Arrival date & time 06/01/15  [id number] History  By signing my name below, I, Terressa Koyanagi, attest that this documentation has been prepared under the direction and in the presence of No att. providers found. Electronically Signed: Terressa Koyanagi, ED Scribe. 06/01/2015. 9:57 AM.  Chief Complaint  Patient presents with  . Heart Problem   HPI PCP: Carlynn Spry, NP HPI Comments: Samantha Pierce is a 44 y.o. female, with PMHx noted below including HTN, DM, TIA, stroke (09/2014), and COPD, who presents to the Emergency Department complaining of one episode of asystole this morning while at cardiac rehab for a non-stress test. Associated Sx include dizziness, headache, SOB, and chest pain/tightness (during exam pt denies any chest pain). Pt reports she was at baseline and feeling well before her asystole episode this morning. Pt reports the last time she ingested food was at 10pm last night. Pt reports taking her BP meds this morning. Pt denies vomtiing, diarrhea, abd pain, back pain.    Past Medical History  Diagnosis Date  . Essential hypertension   . History of blood transfusion   . Diabetes mellitus with diabetic retinopathy   . Depression   . Anemia   . CKD (chronic kidney disease) stage 3, GFR 30-59 ml/min   . COPD (chronic obstructive pulmonary disease) ([place])   . History of stroke April 2016    Acute lacunar infarct of the left thalamus  . TIA (transient ischemic attack)    Past Surgical History  Procedure Laterality Date  . Dilation and curettage of uterus    . Cesarean section      x2  . Tubal ligation    . Pars plana vitrectomy  04/20/2011    Procedure: PARS PLANA VITRECTOMY WITH 25 GAUGE;  Surgeon: Hayden Pedro, MD;  Location: [place];  Service: Ophthalmology;  Laterality: Left;  membrane peel, gas fluid exchange, endolaser, repair of complex retinal detachment  . Eye surgery      Right eye pars plano vitrectomy   . Lasik     Family History  Problem Relation  Age of Onset  . Diabetes Mother    Social History  Substance Use Topics  . Smoking status: Former Smoker -- 0.03 packs/day for 30 years    Types: Cigarettes    Start date: 06/05/1981    Quit date: 04/04/2014  . Smokeless tobacco: Never Used  . Alcohol Use: No   OB History    Gravida Para Term Preterm AB TAB SAB Ectopic Multiple Living   2 2 2       2      Review of Systems  A complete 10 system review of systems was obtained and all systems are negative except as noted in the HPI and PMH.   Allergies  Penicillins  Home Medications   Prior to Admission medications   Medication Sig Start Date End Date Taking? Authorizing Provider  albuterol (PROVENTIL HFA;VENTOLIN HFA) 108 (90 BASE) MCG/ACT inhaler Inhale 1-2 puffs into the lungs every 6 (six) hours as needed for wheezing or shortness of breath. 06/20/14  Yes Nat Christen, MD  aspirin EC 81 MG EC tablet Take 1 tablet (81 mg total) by mouth daily. 05/24/15  Yes Modena Jansky, MD  atorvastatin (LIPITOR) 80 MG tablet Take 1 tablet (80 mg total) by mouth daily at 6 PM. 05/24/15  Yes Modena Jansky, MD  cloNIDine (CATAPRES) 0.2 MG tablet Take 0.2 mg by mouth daily.   Yes Historical  Provider, MD  clopidogrel (PLAVIX) 75 MG tablet Take 1 tablet (75 mg total) by mouth daily. 05/24/15  Yes Modena Jansky, MD  FLUoxetine (PROZAC) 10 MG capsule Take 10 mg by mouth daily.   Yes Historical Provider, MD  gabapentin (NEURONTIN) 300 MG capsule Take 1 capsule (300 mg total) by mouth at bedtime as needed (For numbness and tingling). 09/05/14  Yes Rexene Alberts, MD  insulin aspart (NOVOLOG) 100 UNIT/ML FlexPen Inject 0-18 Units into the skin 3 (three) times daily with meals.   Yes Historical Provider, MD  Insulin Detemir (LEVEMIR) 100 UNIT/ML Pen Inject 60 Units into the skin at bedtime. 05/24/15  Yes Modena Jansky, MD  lisinopril-hydrochlorothiazide (PRINZIDE,ZESTORETIC) 20-12.5 MG per tablet Take 1 tablet by mouth daily.   Yes Historical  Provider, MD  metoprolol succinate (TOPROL-XL) 25 MG 24 hr tablet Take 25 mg by mouth daily.   Yes Historical Provider, MD  tiotropium (SPIRIVA) 18 MCG inhalation capsule Place 18 mcg into inhaler and inhale daily.   Yes Historical Provider, MD  traZODone (DESYREL) 100 MG tablet Take 100 mg by mouth at bedtime.   Yes Historical Provider, MD   Triage Vitals: BP 150/77 mmHg  Pulse 66  Resp 16  SpO2 97%  LMP 06/01/2015 Physical Exam  Constitutional: She is oriented to person, place, and time. She appears well-developed and well-nourished. No distress.  HENT:  Head: Normocephalic and atraumatic.  Mouth/Throat: Oropharynx is clear and moist. Mucous membranes are dry. No oropharyngeal exudate.  Eyes: Conjunctivae and EOM are normal. Pupils are equal, round, and reactive to light.  Neck: Normal range of motion. Neck supple.  No meningismus.  Cardiovascular: Normal rate, regular rhythm, normal heart sounds and intact distal pulses.   No murmur heard. Pulmonary/Chest: Effort normal and breath sounds normal. No respiratory distress.  Abdominal: Soft. There is no tenderness. There is no rebound and no guarding.  Musculoskeletal: Normal range of motion. She exhibits no edema or tenderness.  Neurological: She is alert and oriented to person, place, and time. No cranial nerve deficit. She exhibits normal muscle tone. Coordination normal.  No ataxia on finger to nose bilaterally. No pronator drift. 5/5 strength throughout. CN 2-12 intact.Equal grip strength. Sensation intact.   Skin: Skin is warm.  Psychiatric: She has a normal mood and affect. Her behavior is normal.  Nursing note and vitals reviewed.   ED Course  Procedures (including critical care time) DIAGNOSTIC STUDIES: Oxygen Saturation is 97% on ra, nl by my interpretation.    COORDINATION OF CARE: 9:37 AM: Discussed treatment plan which includes imaging, labs, discussed EKG results, and likely transfer to [facility] with pt at bedside;  patient verbalizes understanding and agrees with treatment plan.  Labs Review Labs Reviewed  CBC WITH DIFFERENTIAL/PLATELET - Abnormal; Notable for the following:    MCH 25.1 (*)    All other components within normal limits  COMPREHENSIVE METABOLIC PANEL - Abnormal; Notable for the following:    Glucose, Bld 172 (*)    BUN 25 (*)    Creatinine, Ser 1.77 (*)    Calcium 8.6 (*)    Total Protein 5.6 (*)    Albumin 2.2 (*)    GFR calc non Af Amer 34 (*)    GFR calc Af Amer 39 (*)    All other components within normal limits  LITHIUM LEVEL - Abnormal; Notable for the following:    Lithium Lvl <0.06 (*)    All other components within normal limits  BASIC METABOLIC PANEL -  Abnormal; Notable for the following:    Glucose, Bld 169 (*)    BUN 22 (*)    Creatinine, Ser 1.80 (*)    Calcium 8.7 (*)    GFR calc non Af Amer 33 (*)    GFR calc Af Amer 38 (*)    All other components within normal limits  CBC - Abnormal; Notable for the following:    Hemoglobin 11.9 (*)    MCH 24.6 (*)    All other components within normal limits  MRSA PCR SCREENING  TROPONIN I  PROTIME-INR  PROTIME-INR  CBG MONITORING, ED    Imaging Review Dg Chest Portable 1 View  06/01/2015  CLINICAL DATA:  44 year old female with chest pain which occurred during a cardiac stress test EXAM: PORTABLE CHEST 1 VIEW COMPARISON:  Most recent prior chest x-ray 04/12/15 FINDINGS: Cardiac mediastinal contours are within normal limits. Inspiratory volumes are low but the lungs remain clear. No pleural effusion or pneumothorax. No acute osseous abnormality. IMPRESSION: No active disease. Electronically Signed   By: Jacqulynn Cadet M.D.   On: 06/01/2015 09:57   I have personally reviewed and evaluated these images and lab results as part of my medical decision-making.   EKG Interpretation   Date/Time:  Tuesday June 01 2015 09:31:50 EST Ventricular Rate:  66 PR Interval:  187 QRS Duration: 92 QT Interval:  388 QTC  Calculation: 406 R Axis:   36 Text Interpretation:  Sinus rhythm No significant change was found  Confirmed by Wyvonnia Dusky  MD, Rik Wadel [phone]) on 06/01/2015 10:19:44 AM      MDM   Final diagnoses:  Cardiac arrest [facility])   Patient from cardiac rehabilitation after period of asystole during stress test after Lexiscan injection. Staff reports that she had approximately 2 minutes of asystole after injection. She did not receive CPR. CODE BLUE was activated. On code team arrival patient was awake and alert with normal sinus rhythm.  She states she felt normal prior to her stress test today. She had a recent admission for a TIA 2 weeks ago. No known CAD.  Denies chest pain now. EKG normal sinus rhythm without acute ST changes.  D/w NP lawrence in rehab suite.  She is arranging transfer to [facility] for catheterization. Dr. Marlou Porch accepting.   CRITICAL CARE Performed by: Ezequiel Essex Total critical care time: 30 minutes Critical care time was exclusive of separately billable procedures and treating other patients. Critical care was necessary to treat or prevent imminent or life-threatening deterioration. Critical care was time spent personally by me on the following activities: development of treatment plan with patient and/or surrogate as well as nursing, discussions with consultants, evaluation of patient's response to treatment, examination of patient, obtaining history from patient or surrogate, ordering and performing treatments and interventions, ordering and review of laboratory studies, ordering and review of radiographic studies, pulse oximetry and re-evaluation of patient's condition.   I personally performed the services described in this documentation, which was scribed in my presence. The recorded information has been reviewed and is accurate.    Ezequiel Essex, MD 06/01/15 [phone]

## 2015-06-01 NOTE — ED Notes (Signed)
Report given to [facility] with [facility] at this time.

## 2015-06-01 NOTE — Progress Notes (Signed)
Utilization Review Completed.Maha Fischel T12/27/2016  

## 2015-06-01 NOTE — Progress Notes (Signed)
Called by nurse for concern for high BP. Despite getting clonidine earlier, BP still 196/86. Did not get home lisinopril today. Would prefer not to give this evening given cath today and Cr of 1.8. Will try hydralazine IV to lower BP.   I also saw that metoprolol is on home med list to give tomorrow. Given bradycardia earlier today during stress test leading to asystole, will discontinue from [facility] - further decisions about this can be made tomorrow by rounding team.   I have also written an order for the AM nurse to please discuss creatinine with rounding team before giving tomorrow's lisinopril/HCTZ in case there is any evidence of further renal issue, requiring alternative agent instead.  Dayna Dunn PA-C

## 2015-06-01 NOTE — ED Notes (Signed)
Pt from cardiac rehab, non-stress test this morning went into asystole and came out of it within a minute.  Pt alert and oriented, nothing to eat today.

## 2015-06-02 ENCOUNTER — Encounter (HOSPITAL_COMMUNITY): Payer: Medicaid Other

## 2015-06-02 ENCOUNTER — Encounter (HOSPITAL_COMMUNITY): Payer: Self-pay | Admitting: Interventional Cardiology

## 2015-06-02 ENCOUNTER — Ambulatory Visit (HOSPITAL_COMMUNITY): Payer: Medicaid Other

## 2015-06-02 DIAGNOSIS — I2510 Atherosclerotic heart disease of native coronary artery without angina pectoris: Secondary | ICD-10-CM

## 2015-06-02 DIAGNOSIS — I469 Cardiac arrest, cause unspecified: Secondary | ICD-10-CM | POA: Diagnosis not present

## 2015-06-02 DIAGNOSIS — N183 Chronic kidney disease, stage 3 (moderate): Secondary | ICD-10-CM

## 2015-06-02 DIAGNOSIS — R001 Bradycardia, unspecified: Secondary | ICD-10-CM | POA: Diagnosis not present

## 2015-06-02 DIAGNOSIS — I10 Essential (primary) hypertension: Secondary | ICD-10-CM | POA: Diagnosis not present

## 2015-06-02 LAB — BASIC METABOLIC PANEL
Anion gap: 6 (ref 5–15)
BUN: 19 mg/dL (ref 6–20)
CO2: 23 mmol/L (ref 22–32)
Calcium: 8.4 mg/dL — ABNORMAL LOW (ref 8.9–10.3)
Chloride: 107 mmol/L (ref 101–111)
Creatinine, Ser: 1.7 mg/dL — ABNORMAL HIGH (ref 0.44–1.00)
GFR calc Af Amer: 41 mL/min — ABNORMAL LOW (ref >60)
GFR calc non Af Amer: 35 mL/min — ABNORMAL LOW (ref >60)
Glucose, Bld: 138 mg/dL — ABNORMAL HIGH (ref 65–99)
Potassium: 4.5 mmol/L (ref 3.5–5.1)
Sodium: 136 mmol/L (ref 135–145)

## 2015-06-02 LAB — GLUCOSE, CAPILLARY
Glucose-Capillary: 189 mg/dL — ABNORMAL HIGH (ref 65–99)
Glucose-Capillary: 108 mg/dL — ABNORMAL HIGH (ref 65–99)

## 2015-06-02 MED ORDER — METOPROLOL SUCCINATE ER 25 MG PO TB24
25.0000 mg | ORAL_TABLET | Freq: Every day | ORAL | Status: DC
  Administered 2015-06-02: 25 mg via ORAL
  Filled 2015-06-02: qty 1

## 2015-06-02 NOTE — Progress Notes (Signed)
D/C home via wheelchair. Pt is ambulatory and A&O x4 at time of discharge. Denies pain or problems. No s/s of distress noted.

## 2015-06-02 NOTE — Discharge Summary (Signed)
Discharge Summary   Patient ID: Samantha Pierce,  MRN: [id number], DOB/AGE: 1971/03/02 44 y.o.  Admit date: 06/01/2015 Discharge date: 06/02/2015  Primary Care Provider: Carlynn Spry Primary Cardiologist: Dr. Domenic Polite  Discharge Diagnoses    Principal Problem:   Asystole [facility]) Active Problems:   Essential hypertension   CKD (chronic kidney disease), stage III   Pain in the chest   Hyperlipidemia   Mild CAD   Bradycardia   Morbid obesity (HCC)   Allergies Allergies  Allergen Reactions  . Lexiscan [Regadenoson]     Asystole after infusion during stress test.   . Penicillins Rash    Has patient had a PCN reaction causing immediate rash, facial/tongue/throat swelling, SOB or lightheadedness with hypotension: Yes Has patient had a PCN reaction causing severe rash involving mucus membranes or skin necrosis: No Has patient had a PCN reaction that required hospitalization No Has patient had a PCN reaction occurring within the last 10 years: No If all of the above answers are "NO", then may proceed with Cephalosporin use.     Diagnostic Studies/Procedures    1) Cardiac catheterization this admission, please see full report and below for summary. _____________   History of Present Illness/Hospital Course  [name] is a 44 y/o F with history of HTN, lacunar stroke 09/2014 after hypertensive urgency, longstanding DM with retinopathy, CKD stage III, COPD, depression, morbid obesity, and anemia who presented to [facility] upon transfer from [facility] for episode of bradycardia and asystole. She was recently seen by Dr. Domenic Polite for chest discomfort. 2-day Lexiscan was arranged. Per admission note, during stress test, one minute after injection of Lexiscan she had bradycardia and then asystole with lack of respirations and unconsciousness. Code was called as she remained in asystole for apporxiimately 2 minutes. CPR began but she spontaneously regained consciousness and spontaneously heart  rhythm returning to normal. BG glucose was checked and found to be 189. Code was cancelled. BP returned to normal at 149/80. She was stabilized. She noted ongoing chest discomfort and was transferred to [facility] for cath. This demonstrated 30% prox Cx lesion, otherwise widely patent with minimal plaque. Only 40 cc contrast was used. Left ventriculography was not performed in an attempt to minimize contrast exposure. (Note prior echo 05/23/15 showed normal EF 60-65%.) Troponin was negative. Her asystole during Lexiscan infusion was felt to represent an excessive pharmacologic response to the agent and likely superimposed vasovagal response. This agent should not be used again. She was observed and did well post-procedurally. F/u BMET pending this morning - if stable, will plan d/c home. Telemetry has been stable. Dr. Irish Lack has seen and examined the patient today and feels she is stable for discharge. He did not feel any restrictions or medication changes were necessary at this time. Standard post-cath discharge instructions were provided. Attempted to schedule f/u appt in [place] but kept getting voicemail - I have sent a message to our [place] office's scheduler requesting a follow-up appointment, and our office will call the patient with this information.  Consultants: N/a  _____________  Discharge Vitals Blood pressure 155/71, pulse 62, temperature 98.4 F (36.9 C), temperature source Oral, resp. rate 21, height 5\' 4"  (1.626 m), weight 268 lb 11.9 oz (121.9 kg), last menstrual period 06/01/2015, SpO2 98 %.  Filed Weights   06/01/15 1207  Weight: 268 lb 11.9 oz (121.9 kg)   Physical Exam General: Well developed, well nourished, in no acute distress. Head: Normocephalic, atraumatic, sclera non-icteric, no xanthomas, nares are without discharge. Neck:  Negative for carotid bruits. JVP not elevated. Lungs: Clear bilaterally to auscultation without wheezes, rales, or rhonchi. Breathing is  unlabored. Heart: RRR S1 S2 without murmurs, rubs, or gallops.  Abdomen: Soft, non-tender, non-distended with normoactive bowel sounds. No rebound/guarding. Extremities: No clubbing or cyanosis. No edema. Distal pedal pulses are 2+ and equal bilaterally. radial cath site without hematoma or ecchymosis; good pulse. Neuro: Alert and oriented X 3. Moves all extremities spontaneously. Psych:  Responds to questions appropriately with a normal affect.  _____________  Labs     CBC  Recent Labs  06/01/15 0945 06/01/15 1255  WBC 6.5 8.3  NEUTROABS 3.6  --   HGB 12.8 11.9*  HCT 40.0 37.8  MCV 78.4 78.1  PLT 345 [id number]   Basic Metabolic Panel  Recent Labs  06/01/15 0945 06/01/15 1255  NA 138 139  K 4.2 4.6  CL 107 108  CO2 25 24  GLUCOSE 172* 169*  BUN 25* 22*  CREATININE 1.77* 1.80*  CALCIUM 8.6* 8.7*   Liver Function Tests  Recent Labs  06/01/15 0945  AST 16  ALT 17  ALKPHOS 43  BILITOT 0.3  PROT 5.6*  ALBUMIN 2.2*   Cardiac Enzymes  Recent Labs  06/01/15 0945  TROPONINI <0.03  _____________  Disposition   Pt is being discharged home today in good condition. _____________  Follow-up Plans & Appointments    Follow-up Information    Follow up with Rozann Lesches, MD.   Specialty:  Cardiology   Why:  Office will call you for your followup appointment. Call office if you have not heard back in 3 days.   Contact information:   [place] [address] [phone]      Discharge Instructions    Diet - low sodium heart healthy    Complete by:  As directed      Increase activity slowly    Complete by:  As directed   No driving for 2 days. No lifting over 5 lbs for 1 week. No sexual activity for 1 week. Keep procedure site clean & dry. If you notice increased pain, swelling, bleeding or pus, call/return!  You may shower, but no soaking baths/hot tubs/pools for 1 week.           Discharge Medications   Current Discharge Medication  List    CONTINUE these medications which have NOT CHANGED   Details  albuterol (PROVENTIL HFA;VENTOLIN HFA) 108 (90 BASE) MCG/ACT inhaler Inhale 1-2 puffs into the lungs every 6 (six) hours as needed for wheezing or shortness of breath.     aspirin EC 81 MG EC tablet Take 1 tablet (81 mg total) by mouth daily.     atorvastatin (LIPITOR) 80 MG tablet Take 1 tablet (80 mg total) by mouth daily at 6 PM.     cloNIDine (CATAPRES) 0.2 MG tablet Take 0.2 mg by mouth daily.    clopidogrel (PLAVIX) 75 MG tablet Take 1 tablet (75 mg total) by mouth daily.     FLUoxetine (PROZAC) 10 MG capsule Take 10 mg by mouth daily.    gabapentin (NEURONTIN) 300 MG capsule Take 1 capsule (300 mg total) by mouth at bedtime as needed (For numbness and tingling).    insulin aspart (NOVOLOG) 100 UNIT/ML FlexPen Inject 0-18 Units into the skin 3 (three) times daily with meals.    Insulin Detemir (LEVEMIR) 100 UNIT/ML Pen Inject 60 Units into the skin at bedtime.    lisinopril-hydrochlorothiazide (PRINZIDE,ZESTORETIC) 20-12.5 MG per tablet Take  1 tablet by mouth daily.    metoprolol succinate (TOPROL-XL) 25 MG 24 hr tablet Take 25 mg by mouth daily.    tiotropium (SPIRIVA) 18 MCG inhalation capsule Place 18 mcg into inhaler and inhale daily.    traZODone (DESYREL) 100 MG tablet Take 100 mg by mouth at bedtime.        _____________   Outstanding Labs/Studies   N/A at this time.  Duration of Discharge Encounter   Greater than 30 minutes including physician time.  Signed, Melina Copa PA-C 06/02/2015, 9:05 AM   I have examined the patient and reviewed assessment and plan and discussed with patient.  Agree with above as stated.  Patient likely had a reaction to the AV nodal blocking agent at stress test.  No pauses noted on telemetry.  Needs BP control.  OK to restart low dose beta blocker since she was tolerating this prior to the stress test.  Minimal CAD at cath.  OK for discharge.  Right radial  site with palpable pulse.  Deangelo Berns S.

## 2015-06-06 DIAGNOSIS — Z87898 Personal history of other specified conditions: Secondary | ICD-10-CM

## 2015-06-06 HISTORY — DX: Personal history of other specified conditions: Z87.898

## 2015-06-11 ENCOUNTER — Encounter: Payer: Self-pay | Admitting: Physician Assistant

## 2015-06-11 ENCOUNTER — Ambulatory Visit (INDEPENDENT_AMBULATORY_CARE_PROVIDER_SITE_OTHER): Payer: Medicaid Other | Admitting: Physician Assistant

## 2015-06-11 VITALS — BP 150/88 | HR 62 | Ht 64.0 in | Wt 280.0 lb

## 2015-06-11 DIAGNOSIS — I469 Cardiac arrest, cause unspecified: Secondary | ICD-10-CM

## 2015-06-11 DIAGNOSIS — R072 Precordial pain: Secondary | ICD-10-CM | POA: Diagnosis not present

## 2015-06-11 DIAGNOSIS — I10 Essential (primary) hypertension: Secondary | ICD-10-CM | POA: Diagnosis not present

## 2015-06-11 DIAGNOSIS — E10319 Type 1 diabetes mellitus with unspecified diabetic retinopathy without macular edema: Secondary | ICD-10-CM | POA: Diagnosis not present

## 2015-06-11 DIAGNOSIS — I2510 Atherosclerotic heart disease of native coronary artery without angina pectoris: Secondary | ICD-10-CM

## 2015-06-11 DIAGNOSIS — E785 Hyperlipidemia, unspecified: Secondary | ICD-10-CM

## 2015-06-11 NOTE — Progress Notes (Addendum)
Cardiology Office Note   Date:  06/11/2015   ID:  Samantha Pierce, DOB May 22, 1971, MRN [id number]  PCP:  Carlynn Spry, NP  Cardiologist:  Dr Joie Bimler, PA-C   History of Present Illness: Samantha Pierce is a 45 y.o. female with a history of HTN, DM, CKD III, CVA.  Pt had chest pain>>Lexiscan MV>>asystole 1" in to test req CPR. Cath w/ minimal CAD, EF prev normal by echo. D/c 12/28   Hezzie Pierce presents for TCM post-hospital f/u.   Since d/c from the hospital, she has done pretty well. No chest pain. She has had DOE and has noted LE edema. She has orthopnea, but PND is not clear as she probably has OSA, sleep study scheduled. She is on Lasix, unclear dose, and has not taken it today. She has been pushing fluids to flush out her kidneys. She is on lisinopril/HCTZ in addition to the Lasix. She does not weigh herself, does not have scales. She has not had presyncope or syncope  She has been trying to get her A1c down, has an appointment with a nutritionist.  She has had headaches and is following with a neurologist for them. At some point, she may come off the ASA or the Plavix, she is not sure which. Some L flank pain, but no dysuria, fever, or chills. Has not felt sick.    Past Medical History  Diagnosis Date  . Essential hypertension   . History of blood transfusion   . Diabetes mellitus with diabetic retinopathy   . Depression   . Anemia   . CKD (chronic kidney disease) stage 3, GFR 30-59 ml/min   . COPD (chronic obstructive pulmonary disease) ([place])   . History of stroke April 2016    Acute lacunar infarct of the left thalamus  . TIA (transient ischemic attack)   . Asystole ([place])     a. 05/2015: pt developed bradycardia->asystole during [place] nuclear stress test, s/p brief code. Cath with only 30% prox LCx. Her asystole during Lexiscan infusion was felt to represent an excessive pharmacologic response to the agent and likely superimposed vasovagal  response.  . Mild CAD     a. LHC 05/2015: 30% prox Cx, otherwise widely patent.  . Morbid obesity [facility])     Past Surgical History  Procedure Laterality Date  . Dilation and curettage of uterus    . Cesarean section      x2  . Tubal ligation    . Pars plana vitrectomy  04/20/2011    Procedure: PARS PLANA VITRECTOMY WITH 25 GAUGE;  Surgeon: Hayden Pedro, MD;  Location: [place];  Service: Ophthalmology;  Laterality: Left;  membrane peel, gas fluid exchange, endolaser, repair of complex retinal detachment  . Eye surgery      Right eye pars plano vitrectomy   . Lasik    . Cardiac catheterization N/A 06/01/2015    Procedure: Left Heart Cath and Coronary Angiography;  Surgeon: Belva Crome, MD; CFX 30%, no other dz, EF nl by echo    Current Outpatient Prescriptions  Medication Sig Dispense Refill  . albuterol (PROVENTIL HFA;VENTOLIN HFA) 108 (90 BASE) MCG/ACT inhaler Inhale 1-2 puffs into the lungs every 6 (six) hours as needed for wheezing or shortness of breath. 1 Inhaler 0  . aspirin EC 81 MG EC tablet Take 1 tablet (81 mg total) by mouth daily. 30 tablet 0  . atorvastatin (LIPITOR) 80 MG tablet Take 1 tablet (80 mg total)  by mouth daily at 6 PM. 30 tablet 0  . cloNIDine (CATAPRES) 0.2 MG tablet Take 0.2 mg by mouth daily.    . clopidogrel (PLAVIX) 75 MG tablet Take 1 tablet (75 mg total) by mouth daily. 30 tablet 0  . FLUoxetine (PROZAC) 10 MG capsule Take 10 mg by mouth daily.    Marland Kitchen gabapentin (NEURONTIN) 300 MG capsule Take 1 capsule (300 mg total) by mouth at bedtime as needed (For numbness and tingling).    . insulin aspart (NOVOLOG) 100 UNIT/ML FlexPen Inject 0-18 Units into the skin 3 (three) times daily with meals.    . Insulin Detemir (LEVEMIR) 100 UNIT/ML Pen Inject 60 Units into the skin at bedtime.    Marland Kitchen lisinopril-hydrochlorothiazide (PRINZIDE,ZESTORETIC) 20-12.5 MG per tablet Take 1 tablet by mouth daily.    . metoprolol succinate (TOPROL-XL) 25 MG 24 hr tablet Take 25 mg  by mouth daily.    Marland Kitchen tiotropium (SPIRIVA) 18 MCG inhalation capsule Place 18 mcg into inhaler and inhale daily.    . traZODone (DESYREL) 100 MG tablet Take 100 mg by mouth at bedtime.     No current facility-administered medications for this visit.    Allergies:   Lexiscan and Penicillins    Social History:  The patient  reports that she quit smoking about 14 months ago. Her smoking use included Cigarettes. She started smoking about 34 years ago. She has a .9 pack-year smoking history. She has never used smokeless tobacco. She reports that she does not drink alcohol or use illicit drugs.   Family History:  The patient's family history includes Diabetes in her mother.    ROS:  Please see the history of present illness. All other systems are reviewed and negative.    PHYSICAL EXAM: VS:  BP 150/88 mmHg  Pulse 62  Ht 5\' 4"  (1.626 m)  Wt 280 lb (127.007 kg)  BMI 48.04 kg/m2  SpO2 98%  LMP 06/01/2015 , BMI Body mass index is 48.04 kg/(m^2). GEN: Well nourished, well developed, female in no acute distress HEENT: normal for age  Neck: mild JVD with some HJ reflux, no carotid bruit, no masses Cardiac: RRR; no murmur, no rubs, or gallops Respiratory:  Slightly decreased BS bases bilaterally, normal work of breathing GI: soft, nontender, nondistended, + BS MS: no deformity or atrophy; 1+ edema; distal pulses are 2+ in all 4 extremities  Skin: warm and dry, no rash Neuro:  Strength and sensation are intact Psych: euthymic mood, full affect   EKG:  EKG is not ordered today.  Recent Labs: 09/04/2014: TSH 1.524 04/12/2015: B Natriuretic Peptide 78.0 06/01/2015: ALT 17; Hemoglobin 11.9*; Platelets 335 06/02/2015: BUN 19; Creatinine, Ser 1.70*; Potassium 4.5; Sodium 136    Lipid Panel    Component Value Date/Time   CHOL 245* 05/23/2015 0545   TRIG 278* 05/23/2015 0545   HDL 45 05/23/2015 0545   CHOLHDL 5.4 05/23/2015 0545   VLDL 56* 05/23/2015 0545   LDLCALC 144* 05/23/2015 0545      Wt Readings from Last 3 Encounters:  06/11/15 280 lb (127.007 kg)  06/01/15 268 lb 11.9 oz (121.9 kg)  05/22/15 275 lb (124.739 kg)     Other studies Reviewed: Additional studies/ records that were reviewed today include: Hospital records.  ASSESSMENT AND PLAN:  1.  Asystole 2nd Lexiscan: no presyncope or syncope since d/c. Do not think monitor is needed.  2. Mild CAD: Discussed CRF reduction with pt and her mother. Let pt know this is needed to  prevent very minor CAD from getting worse. Continue ASA, BB and statin. Work on better DM control.  3. Volume overload: she weighs more than at d/c, some of this 2nd to clothes and shoes. However, she has some volume overload by exam. She has CKD III/IV, followed by Dr Lowanda Foster. We will leave Lasix dosing to him. Consider d/c'ing the HCTZ and giving pt lisinopril alone. She was given information on 2000 mg sodium diet, advised to drink only 6- 8 oz glasses of liquid daily, mostly water; and weigh daily if she can get scales.   4. Hyperlipidemia: continue statin, will need lipid and CMET when she follow up in the clinic.  Current medicines are reviewed at length with the patient today.  The patient does not have concerns regarding medicines.  The following changes have been made:  no change  Labs/ tests ordered today include:  ECG   Disposition:   FU with Dr Domenic Polite in 3 months  Signed, Lenoard Aden  06/11/2015 2:53 PM    [address], [place], [place]  [address] Phone: [phone]; Fax: [phone]

## 2015-06-11 NOTE — Addendum Note (Signed)
Addended by: Levonne Hubert on: 06/11/2015 03:47 PM   Modules accepted: Orders

## 2015-06-11 NOTE — Patient Instructions (Signed)
Your physician recommends that you schedule a follow-up appointment in: 3 Months wit Dr. Domenic Polite   Your physician recommends that you continue on your current medications as directed. Please refer to the Current Medication list given to you today.  Please call the office with your Lasix dose  Please have no more than 6 8 oz. Of water per day  Keep all appointments with Dr. Lowanda Foster and Primary Care   Low-Sodium Eating Plan Sodium raises blood pressure and causes water to be held in the body. Getting less sodium from food will help lower your blood pressure, reduce any swelling, and protect your heart, liver, and kidneys. We get sodium by adding salt (sodium chloride) to food. Most of our sodium comes from canned, boxed, and frozen foods. Restaurant foods, fast foods, and pizza are also very high in sodium. Even if you take medicine to lower your blood pressure or to reduce fluid in your body, getting less sodium from your food is important. WHAT IS MY PLAN? Most people should limit their sodium intake to 2,300 mg a day. Your health care provider recommends that you limit your sodium intake to _2000mg ___ a day.  WHAT DO I NEED TO KNOW ABOUT THIS EATING PLAN? For the low-sodium eating plan, you will follow these general guidelines:  Choose foods with a % Daily Value for sodium of less than 5% (as listed on the food label).   Use salt-free seasonings or herbs instead of table salt or sea salt.   Check with your health care provider or pharmacist before using salt substitutes.   Eat fresh foods.  Eat more vegetables and fruits.  Limit canned vegetables. If you do use them, rinse them well to decrease the sodium.   Limit cheese to 1 oz (28 g) per day.   Eat lower-sodium products, often labeled as "lower sodium" or "no salt added."  Avoid foods that contain monosodium glutamate (MSG). MSG is sometimes added to [place] food and some canned foods.  Check food labels (Nutrition  Facts labels) on foods to learn how much sodium is in one serving.  Eat more home-cooked food and less restaurant, buffet, and fast food.  When eating at a restaurant, ask that your food be prepared with less salt, or no salt if possible.  HOW DO I READ FOOD LABELS FOR SODIUM INFORMATION? The Nutrition Facts label lists the amount of sodium in one serving of the food. If you eat more than one serving, you must multiply the listed amount of sodium by the number of servings. Food labels may also identify foods as:  Sodium free--Less than 5 mg in a serving.  Very low sodium--35 mg or less in a serving.  Low sodium--140 mg or less in a serving.  Light in sodium--50% less sodium in a serving. For example, if a food that usually has 300 mg of sodium is changed to become light in sodium, it will have 150 mg of sodium.  Reduced sodium--25% less sodium in a serving. For example, if a food that usually has 400 mg of sodium is changed to reduced sodium, it will have 300 mg of sodium. WHAT FOODS CAN I EAT? Grains Low-sodium cereals, including oats, puffed wheat and rice, and shredded wheat cereals. Low-sodium crackers. Unsalted rice and pasta. Lower-sodium bread.  Vegetables Frozen or fresh vegetables. Low-sodium or reduced-sodium canned vegetables. Low-sodium or reduced-sodium tomato sauce and paste. Low-sodium or reduced-sodium tomato and vegetable juices.  Fruits Fresh, frozen, and canned fruit. Fruit juice.  Meat and Other Protein Products Low-sodium canned tuna and salmon. Fresh or frozen meat, poultry, seafood, and fish. Lamb. Unsalted nuts. Dried beans, peas, and lentils without added salt. Unsalted canned beans. Homemade soups without salt. Eggs.  Dairy Milk. Soy milk. Ricotta cheese. Low-sodium or reduced-sodium cheeses. Yogurt.  Condiments Fresh and dried herbs and spices. Salt-free seasonings. Onion and garlic powders. Low-sodium varieties of mustard and ketchup. Fresh or  refrigerated horseradish. Lemon juice.  Fats and Oils Reduced-sodium salad dressings. Unsalted butter.  Other Unsalted popcorn and pretzels.  The items listed above may not be a complete list of recommended foods or beverages. Contact your dietitian for more options. WHAT FOODS ARE NOT RECOMMENDED? Grains Instant hot cereals. Bread stuffing, pancake, and biscuit mixes. Croutons. Seasoned rice or pasta mixes. Noodle soup cups. Boxed or frozen macaroni and cheese. Self-rising flour. Regular salted crackers. Vegetables Regular canned vegetables. Regular canned tomato sauce and paste. Regular tomato and vegetable juices. Frozen vegetables in sauces. Salted [place] fries. Olives. Angie Fava. Relishes. Sauerkraut. Salsa. Meat and Other Protein Products Salted, canned, smoked, spiced, or pickled meats, seafood, or fish. Bacon, ham, sausage, hot dogs, corned beef, chipped beef, and packaged luncheon meats. Salt pork. Jerky. Pickled herring. Anchovies, regular canned tuna, and sardines. Salted nuts. Dairy Processed cheese and cheese spreads. Cheese curds. Blue cheese and cottage cheese. Buttermilk.  Condiments Onion and garlic salt, seasoned salt, table salt, and sea salt. Canned and packaged gravies. Worcestershire sauce. Tartar sauce. Barbecue sauce. Teriyaki sauce. Soy sauce, including reduced sodium. Steak sauce. Fish sauce. Oyster sauce. Cocktail sauce. Horseradish that you find on the shelf. Regular ketchup and mustard. Meat flavorings and tenderizers. Bouillon cubes. Hot sauce. Tabasco sauce. Marinades. Taco seasonings. Relishes. Fats and Oils Regular salad dressings. Salted butter. Margarine. Ghee. Bacon fat.  Other Potato and tortilla chips. Corn chips and puffs. Salted popcorn and pretzels. Canned or dried soups. Pizza. Frozen entrees and pot pies.  The items listed above may not be a complete list of foods and beverages to avoid. Contact your dietitian for more information.   This  information is not intended to replace advice given to you by your health care provider. Make sure you discuss any questions you have with your health care provider.   Document Released: 11/11/2001 Document Revised: 06/12/2014 Document Reviewed: 03/26/2013 Elsevier Interactive Patient Education [employer].

## 2015-06-16 ENCOUNTER — Ambulatory Visit: Payer: Medicaid Other | Attending: Neurology | Admitting: Sleep Medicine

## 2015-06-16 DIAGNOSIS — G4733 Obstructive sleep apnea (adult) (pediatric): Secondary | ICD-10-CM

## 2015-08-05 ENCOUNTER — Telehealth: Payer: Self-pay | Admitting: Cardiology

## 2015-08-05 MED ORDER — ATORVASTATIN CALCIUM 80 MG PO TABS: 80.0000 mg | ORAL_TABLET | Freq: Every day | ORAL | Status: AC

## 2015-08-05 NOTE — Telephone Encounter (Signed)
Pt is needing her Atorvastatin sent in to [facility].

## 2015-08-05 NOTE — Telephone Encounter (Signed)
Refill sent to [facility] 

## 2015-08-09 ENCOUNTER — Encounter (HOSPITAL_COMMUNITY): Payer: Self-pay

## 2015-08-16 ENCOUNTER — Encounter (HOSPITAL_COMMUNITY): Payer: Self-pay | Admitting: Speech Pathology

## 2015-08-16 NOTE — Therapy (Signed)
[facility] [address] [place], [place], [address] Phone: [phone]   Fax:  [phone]  Patient Details  Name: Samantha Pierce MRN: [id number] Date of Birth: 10/15/1970 Referring Provider:  No ref. provider found  Encounter Date: 08/16/2015 Documentation only encounter  Speech Pathology Pt failed to return to SLP therapy following evaluation on 01/25/2015.  SLP Short Term Goals - 01/25/15 1326    SLP SHORT TERM GOAL #1   Title Pt will complete oral motor exercises 10x each, 2-3x per day by pt report.   Baseline none   Time 4   Period Weeks   Status New   SLP SHORT TERM GOAL #2   Title Pt will record 3-5 items on weekly calendar to facilitate improved recall of appointments and daily activities.   Baseline None   Time 4   Period Weeks   Status New   SLP SHORT TERM GOAL #3   Title Pt will demonstrate safe and efficient consumption of self regulated regular textures and thin liquids with use of strategies independently.   Baseline mechanical soft   Time 4   Period Weeks   Status New          SLP Long Term Goals - 01/25/15 1329    SLP LONG TERM GOAL #1   Title Pt will be independent with home program for oral motor exercises.       SPEECH THERAPY DISCHARGE SUMMARY  Visits from Start of Care:1  Current functional level related to goals / functional outcomes: Goals not met as pt failed to return for treatment following evaluation   Remaining deficits: Unknown   Education / Equipment: N/A  Plan: Patient agrees to discharge.  Patient goals were not met. Patient is being discharged due to not returning since the last visit.  ?????       Thank you,   , CCC-SLP [phone]   , 08/16/2015, 12:16 PM  [facility] [address] [place], [place], [address] Phone: [phone]   Fax:   [phone] 

## 2015-08-23 ENCOUNTER — Emergency Department (HOSPITAL_COMMUNITY)
Admission: EM | Admit: 2015-08-23 | Discharge: 2015-08-23 | Disposition: A | Discharge status: Home / Self Care | Payer: Medicaid Other | Attending: Emergency Medicine | Admitting: Emergency Medicine

## 2015-08-23 ENCOUNTER — Emergency Department (HOSPITAL_COMMUNITY): Payer: Medicaid Other

## 2015-08-23 ENCOUNTER — Encounter (HOSPITAL_COMMUNITY): Payer: Self-pay | Admitting: Emergency Medicine

## 2015-08-23 DIAGNOSIS — J441 Chronic obstructive pulmonary disease with (acute) exacerbation: Secondary | ICD-10-CM | POA: Insufficient documentation

## 2015-08-23 DIAGNOSIS — Z794 Long term (current) use of insulin: Secondary | ICD-10-CM | POA: Diagnosis not present

## 2015-08-23 DIAGNOSIS — F329 Major depressive disorder, single episode, unspecified: Secondary | ICD-10-CM | POA: Insufficient documentation

## 2015-08-23 DIAGNOSIS — Z8673 Personal history of transient ischemic attack (TIA), and cerebral infarction without residual deficits: Secondary | ICD-10-CM | POA: Diagnosis not present

## 2015-08-23 DIAGNOSIS — E11319 Type 2 diabetes mellitus with unspecified diabetic retinopathy without macular edema: Secondary | ICD-10-CM | POA: Insufficient documentation

## 2015-08-23 DIAGNOSIS — E1122 Type 2 diabetes mellitus with diabetic chronic kidney disease: Secondary | ICD-10-CM | POA: Diagnosis not present

## 2015-08-23 DIAGNOSIS — I130 Hypertensive heart and chronic kidney disease with heart failure and stage 1 through stage 4 chronic kidney disease, or unspecified chronic kidney disease: Secondary | ICD-10-CM | POA: Insufficient documentation

## 2015-08-23 DIAGNOSIS — N183 Chronic kidney disease, stage 3 (moderate): Secondary | ICD-10-CM | POA: Diagnosis not present

## 2015-08-23 DIAGNOSIS — I509 Heart failure, unspecified: Secondary | ICD-10-CM | POA: Insufficient documentation

## 2015-08-23 DIAGNOSIS — R0602 Shortness of breath: Secondary | ICD-10-CM | POA: Diagnosis present

## 2015-08-23 DIAGNOSIS — Z87891 Personal history of nicotine dependence: Secondary | ICD-10-CM | POA: Insufficient documentation

## 2015-08-23 HISTORY — DX: Cerebral infarction, unspecified: I63.9

## 2015-08-23 LAB — URINALYSIS, ROUTINE W REFLEX MICROSCOPIC
Bilirubin Urine: NEGATIVE
Glucose, UA: >1000 mg/dL — AB
Ketones, ur: NEGATIVE mg/dL
Nitrite: NEGATIVE
Protein, ur: >300 mg/dL — AB
Specific Gravity, Urine: 1.01 (ref 1.005–1.030)
pH: 6.5 (ref 5.0–8.0)

## 2015-08-23 LAB — URINE MICROSCOPIC-ADD ON

## 2015-08-23 MED ORDER — PREDNISONE 20 MG PO TABS: 40.0000 mg | ORAL_TABLET | Freq: Every day | ORAL | Status: AC

## 2015-08-23 MED ORDER — AZITHROMYCIN 250 MG PO TABS: 250.0000 mg | ORAL_TABLET | Freq: Every day | ORAL | Status: DC

## 2015-08-23 MED ORDER — ALBUTEROL SULFATE (2.5 MG/3ML) 0.083% IN NEBU
2.5000 mg | INHALATION_SOLUTION | Freq: Once | RESPIRATORY_TRACT | Status: AC
  Administered 2015-08-23: 2.5 mg via RESPIRATORY_TRACT
  Filled 2015-08-23: qty 3

## 2015-08-23 NOTE — ED Provider Notes (Signed)
CSN: [id number]     Arrival date & time 08/23/15  0709 History   First MD Initiated Contact with Patient 08/23/15 0725     Chief Complaint  Patient presents with  . Shortness of Breath     (Consider location/radiation/quality/duration/timing/severity/associated sxs/prior Treatment) HPI Comments: Patient is a 45yo female with a PHMx of COPD who presents today after a week of cough, congestion, and shortness of breath. Patient reports she has taken her albuterol and Spiriva, OTC cough medicine and drops without relief. Patient reports 4 episodes of vomiting phlegm over the past week after coughing. No associated vomiting or diarrhea. Patient reports associated dizziness, hoarseness, wheezing, and increased shortness of breath on exertion. Patient quit smoking a year ago at time of COPD diagnosis. Patient reports increased frequency in urination, and urination on self when coughing. Normal appetite. Patient denies leg swelling, has decreased from the past. Patient on Lasix for CKD.  Patient is a 45 y.o. female presenting with shortness of breath. The history is provided by the patient. No language interpreter was used.  Shortness of Breath Associated symptoms: vomiting (4 episdoes of mucus following coughing episodes)   Associated symptoms: no abdominal pain, no chest pain, no fever, no headaches, no rash and no sore throat     Past Medical History  Diagnosis Date  . Essential hypertension   . History of blood transfusion   . Diabetes mellitus with diabetic retinopathy   . Depression   . Anemia   . CKD (chronic kidney disease) stage 3, GFR 30-59 ml/min   . COPD (chronic obstructive pulmonary disease) ([place])   . History of stroke April 2016    Acute lacunar infarct of the left thalamus  . TIA (transient ischemic attack)   . Asystole ([place])     a. 05/2015: pt developed bradycardia->asystole during [place] nuclear stress test, s/p brief code. Cath with only 30% prox LCx. Her asystole during  Lexiscan infusion was felt to represent an excessive pharmacologic response to the agent and likely superimposed vasovagal response.  . Mild CAD     a. LHC 05/2015: 30% prox Cx, otherwise widely patent.  . Morbid obesity ([place])   . Diabetes mellitus without complication ([place])   . Stroke [facility])     TIA x 2   Past Surgical History  Procedure Laterality Date  . Dilation and curettage of uterus    . Cesarean section      x2  . Tubal ligation    . Pars plana vitrectomy  04/20/2011    Procedure: PARS PLANA VITRECTOMY WITH 25 GAUGE;  Surgeon: Hayden Pedro, MD;  Location: [facility];  Service: Ophthalmology;  Laterality: Left;  membrane peel, gas fluid exchange, endolaser, repair of complex retinal detachment  . Eye surgery      Right eye pars plano vitrectomy   . Lasik    . Cardiac catheterization N/A 06/01/2015    Procedure: Left Heart Cath and Coronary Angiography;  Surgeon: Belva Crome, MD; CFX 30%, no other dz, EF nl by echo   Family History  Problem Relation Age of Onset  . Diabetes Mother    Social History  Substance Use Topics  . Smoking status: Former Smoker -- 0.03 packs/day for 30 years    Types: Cigarettes    Start date: 06/05/1981    Quit date: 04/04/2014  . Smokeless tobacco: Never Used  . Alcohol Use: No   OB History    Gravida Para Term Preterm AB TAB SAB Ectopic Multiple Living  2 2 2       2      Review of Systems  Constitutional: Negative for fever and chills.  HENT: Negative for facial swelling and sore throat.   Respiratory: Positive for shortness of breath.   Cardiovascular: Negative for chest pain.  Gastrointestinal: Positive for vomiting (4 episdoes of mucus following coughing episodes). Negative for nausea, abdominal pain and diarrhea.  Genitourinary: Positive for frequency. Negative for dysuria.  Musculoskeletal: Negative for back pain.  Skin: Negative for rash and wound.  Neurological: Positive for light-headedness. Negative for headaches.   Psychiatric/Behavioral: The patient is not nervous/anxious.       Allergies  Lexiscan and Penicillins  Home Medications   Prior to Admission medications   Medication Sig Start Date End Date Taking? Authorizing Provider  albuterol (PROVENTIL HFA;VENTOLIN HFA) 108 (90 BASE) MCG/ACT inhaler Inhale 1-2 puffs into the lungs every 6 (six) hours as needed for wheezing or shortness of breath. 06/20/14   Nat Christen, MD  aspirin EC 81 MG EC tablet Take 1 tablet (81 mg total) by mouth daily. 05/24/15   Modena Jansky, MD  atorvastatin (LIPITOR) 80 MG tablet Take 1 tablet (80 mg total) by mouth daily at 6 PM. 08/05/15   Satira Sark, MD  azithromycin (ZITHROMAX Z-PAK) 250 MG tablet Take 1 tablet (250 mg total) by mouth daily. Please take 2 tablets on the first day, and 1 tablet days 2-5 08/23/15   Bea Graff Lovenia Debruler, PA-C  cloNIDine (CATAPRES) 0.2 MG tablet Take 0.2 mg by mouth daily.    Historical Provider, MD  clopidogrel (PLAVIX) 75 MG tablet Take 1 tablet (75 mg total) by mouth daily. 05/24/15   Modena Jansky, MD  FLUoxetine (PROZAC) 10 MG capsule Take 10 mg by mouth daily.    Historical Provider, MD  gabapentin (NEURONTIN) 300 MG capsule Take 1 capsule (300 mg total) by mouth at bedtime as needed (For numbness and tingling). 09/05/14   Rexene Alberts, MD  insulin aspart (NOVOLOG) 100 UNIT/ML FlexPen Inject 0-18 Units into the skin 3 (three) times daily with meals.    Historical Provider, MD  Insulin Detemir (LEVEMIR) 100 UNIT/ML Pen Inject 60 Units into the skin at bedtime. 05/24/15   Modena Jansky, MD  lisinopril-hydrochlorothiazide (PRINZIDE,ZESTORETIC) 20-12.5 MG per tablet Take 1 tablet by mouth daily.    Historical Provider, MD  metoprolol succinate (TOPROL-XL) 25 MG 24 hr tablet Take 25 mg by mouth daily.    Historical Provider, MD  predniSONE (DELTASONE) 20 MG tablet Take 2 tablets (40 mg total) by mouth daily. 08/23/15 08/27/15  Adonis Yim M Mallery Harshman, PA-C  tiotropium (SPIRIVA) 18 MCG  inhalation capsule Place 18 mcg into inhaler and inhale daily.    Historical Provider, MD  traZODone (DESYREL) 100 MG tablet Take 100 mg by mouth at bedtime.    Historical Provider, MD   BP 144/79 mmHg  Pulse 77  Temp(Src) 98.4 F (36.9 C)  Resp 18  Ht 5\' 4"  (1.626 m)  Wt 124.739 kg  BMI 47.18 kg/m2  SpO2 99%  LMP 07/30/2015 Physical Exam  Constitutional: She appears well-developed and well-nourished. No distress.  HENT:  Head: Normocephalic and atraumatic.  Eyes: Conjunctivae are normal. Right eye exhibits no discharge. Left eye exhibits no discharge. No scleral icterus.  Neck: Normal range of motion. Neck supple.  Cardiovascular: Normal rate, regular rhythm, normal heart sounds and intact distal pulses.  Exam reveals no gallop and no friction rub.   No murmur heard. Pulmonary/Chest: Effort normal  and breath sounds normal. No stridor. No respiratory distress. She has no wheezes. She has no rales.  Abdominal: Soft. Bowel sounds are normal. She exhibits no distension. There is no tenderness. There is no rebound and no guarding.  Musculoskeletal: She exhibits no edema.  Neurological: She is alert. Coordination normal.  Skin: Skin is warm and dry. No rash noted. She is not diaphoretic. No pallor.  Psychiatric: She has a normal mood and affect.  Nursing note and vitals reviewed.   ED Course  Procedures (including critical care time) Labs Review Labs Reviewed  URINALYSIS, ROUTINE W REFLEX MICROSCOPIC (NOT AT [facility]) - Abnormal; Notable for the following:    APPearance CLOUDY (*)    Glucose, UA >1000 (*)    Hgb urine dipstick MODERATE (*)    Protein, ur >300 (*)    Leukocytes, UA TRACE (*)    All other components within normal limits  URINE MICROSCOPIC-ADD ON - Abnormal; Notable for the following:    Squamous Epithelial / LPF TOO NUMEROUS TO COUNT (*)    Bacteria, UA FEW (*)    All other components within normal limits  URINE CULTURE    Imaging Review Dg Chest 2  View  08/23/2015  CLINICAL DATA:  Short of breath and cough EXAM: CHEST  2 VIEW COMPARISON:  06/01/2015 FINDINGS: The heart size and mediastinal contours are within normal limits. Both lungs are clear. The visualized skeletal structures are unremarkable. IMPRESSION: No active cardiopulmonary disease. Electronically Signed   By: Franchot Gallo M.D.   On: 08/23/2015 07:46   I have personally reviewed and evaluated these images and lab results as part of my medical decision-making.   EKG Interpretation None      MDM   Patient with signs and symptoms of COPD exacerbation. Increased phlegm, coughing, and shortness of breath. Oxygen saturation is above 90%. Lung sounds clear, no wheezing. CXR shows no active cardiopulmonary disease. UA ordered from reports of frequency; shows trace leukocytes; possible contamination. Wait for culture to treat. No accessory muscle use, no cyanosis. Treated with albuterol nebulizer in the ED with good relief.  Patient feels improved after treatment. Will discharge with Z-pak and Prednisone. Patient to use albuterol inhaler at home as needed. Continue Spiriva daily as prescribed. Pt instructed to follow up with PCP if symptoms do not in improve in 48-72 hours. Discussed return precautions. Appears safe for discharge. Patient understands and agrees with the plan. Patient also evaluated by Dr. Sabra Heck who agrees with plan.      Final diagnoses:  COPD exacerbation [facility])      Frederica Kuster, PA-C 08/23/15 1018  Noemi Chapel, MD 08/25/15 1352

## 2015-08-23 NOTE — ED Provider Notes (Signed)
The pt has COPD dx 2 years ago - since has not had much trouble - she has cough and fever for one week - fever has eased off but cough has persisted - occasional vomiting but most days she has no nasuea - denies leg swelling, sore throat, stuffy nose, headache or other c/o.  On exam she has scant bil LE edema, lungs clear, no distress, full sentences, no increased WOB,  CXR, albuterol, prednisone, anticipate d/c.  Medical screening examination/treatment/procedure(s) were conducted as a shared visit with non-physician practitioner(s) and myself.  I personally evaluated the patient during the encounter.  Clinical Impression:   Final diagnoses:  COPD exacerbation ([place])         Noemi Chapel, MD 08/25/15 1352

## 2015-08-23 NOTE — ED Notes (Signed)
Pt reports worsening SOB and cough x 1 week. Pt has hx COPD. Reports taking her inhaler with no relief.

## 2015-08-23 NOTE — Discharge Instructions (Signed)
Medications: Z-Pak, Prednisone  Treatment: Please take Z-pak for 6 days as prescribed. Take Prednisone as prescribed for 5 days. Use albuterol inhaler as needed for shortness of breath. Continue to take spiriva daily.  Follow-up: Please follow up with your doctor in if your symptoms are not improving within the next 48-72 hours. Please return to the emergency department if your shortness of breath becomes worse, or you develop any other new or concerning symptom.   Chronic Obstructive Pulmonary Disease Exacerbation Chronic obstructive pulmonary disease (COPD) is a common lung condition in which airflow from the lungs is limited. COPD is a general term that can be used to describe many different lung problems that limit airflow, including chronic bronchitis and emphysema. COPD exacerbations are episodes when breathing symptoms become much worse and require extra treatment. Without treatment, COPD exacerbations can be life threatening, and frequent COPD exacerbations can cause further damage to your lungs. CAUSES  Respiratory infections.  Exposure to smoke.  Exposure to air pollution, chemical fumes, or dust. Sometimes there is no apparent cause or trigger. RISK FACTORS  Smoking cigarettes.  Older age.  Frequent prior COPD exacerbations. SIGNS AND SYMPTOMS  Increased coughing.  Increased thick spit (sputum) production.  Increased wheezing.  Increased shortness of breath.  Rapid breathing.  Chest tightness. DIAGNOSIS Your medical history, a physical exam, and tests will help your health care provider make a diagnosis. Tests may include:  A chest X-ray.  Basic lab tests.  Sputum testing.  An arterial blood gas test. TREATMENT Depending on the severity of your COPD exacerbation, you may need to be admitted to a hospital for treatment. Some of the treatments commonly used to treat COPD exacerbations are:   Antibiotic medicines.  Bronchodilators. These are drugs that  expand the air passages. They may be given with an inhaler or nebulizer. Spacer devices may be needed to help improve drug delivery.  Corticosteroid medicines.  Supplemental oxygen therapy.  Airway clearing techniques, such as noninvasive ventilation (NIV) and positive expiratory pressure (PEP). These provide respiratory support through a mask or other noninvasive device. HOME CARE INSTRUCTIONS  Do not smoke. Quitting smoking is very important to prevent COPD from getting worse and exacerbations from happening as often.  Avoid exposure to all substances that irritate the airway, especially to tobacco smoke.  If you were prescribed an antibiotic medicine, finish it all even if you start to feel better.  Take all medicines as directed by your health care provider.It is important to use correct technique with inhaled medicines.  Drink enough fluids to keep your urine clear or pale yellow (unless you have a medical condition that requires fluid restriction).  Use a cool mist vaporizer. This makes it easier to clear your chest when you cough.  If you have a home nebulizer and oxygen, continue to use them as directed.  Maintain all necessary vaccinations to prevent infections.  Exercise regularly.  Eat a healthy diet.  Keep all follow-up appointments as directed by your health care provider. SEEK IMMEDIATE MEDICAL CARE IF:  You have worsening shortness of breath.  You have trouble talking.  You have severe chest pain.  You have blood in your sputum.  You have a fever.  You have weakness, vomit repeatedly, or faint.  You feel confused.  You continue to get worse. MAKE SURE YOU:  Understand these instructions.  Will watch your condition.  Will get help right away if you are not doing well or get worse.   This information is not  intended to replace advice given to you by your health care provider. Make sure you discuss any questions you have with your health care  provider.   Document Released: 03/19/2007 Document Revised: 06/12/2014 Document Reviewed: 01/24/2013 Elsevier Interactive Patient Education [employer].

## 2015-08-23 NOTE — ED Notes (Signed)
Patient given water at this time per NP order.

## 2015-08-24 LAB — URINE CULTURE

## 2015-09-20 ENCOUNTER — Encounter: Payer: Self-pay | Admitting: Cardiology

## 2015-09-20 ENCOUNTER — Ambulatory Visit (INDEPENDENT_AMBULATORY_CARE_PROVIDER_SITE_OTHER): Payer: Medicaid Other | Admitting: Cardiology

## 2015-09-20 VITALS — BP 142/82 | HR 78 | Ht 64.0 in | Wt 270.0 lb

## 2015-09-20 DIAGNOSIS — I10 Essential (primary) hypertension: Secondary | ICD-10-CM | POA: Diagnosis not present

## 2015-09-20 DIAGNOSIS — Z8673 Personal history of transient ischemic attack (TIA), and cerebral infarction without residual deficits: Secondary | ICD-10-CM

## 2015-09-20 DIAGNOSIS — Z794 Long term (current) use of insulin: Secondary | ICD-10-CM

## 2015-09-20 DIAGNOSIS — E1140 Type 2 diabetes mellitus with diabetic neuropathy, unspecified: Secondary | ICD-10-CM

## 2015-09-20 DIAGNOSIS — I25810 Atherosclerosis of coronary artery bypass graft(s) without angina pectoris: Secondary | ICD-10-CM

## 2015-09-20 NOTE — Patient Instructions (Signed)
Your physician recommends that you continue on your current medications as directed. Please refer to the Current Medication list given to you today.   Your physician wants you to follow-up in: [place].  You will receive a reminder letter in the mail two months in advance. If you don't receive a letter, please call our office to schedule the follow-up appointment.   Thanks for choosing [facility]!!!

## 2015-09-20 NOTE — Progress Notes (Signed)
Cardiology Office Note  Date: 09/20/2015   ID: Samantha Pierce, DOB Oct 29, 1970, MRN [id number]  PCP: Carlynn Spry, NP  Primary Cardiologist: Rozann Lesches, MD   Chief Complaint  Patient presents with  . Cardiac follow-up    History of Present Illness: Samantha Pierce is a 45 y.o. female seen in consultation back in December 2016. She was referred for a Lexiscan Cardiolite for further evaluation of chest pain. Study was complicated by bradycardia and then an asystolic event with loss of consciousness. CPR was initiated but the patient spontaneously regained consciousness and normal rhythm as well as stable blood pressure.  She was transferred to [facility] where she underwent cardiac catheterization. Cardiac enzymes were negative arguing against ACS. Cardiac catheterization demonstrated only mild 30% atherosclerosis within the circumflex, otherwise patent coronary arteries and normal LVEF. The profound bradycardia and asystolic event were most likely an exaggerated response to [facility] with potential neurocardiogenic component as well as.  She comes into the office for a routine visit today having seen Ms. Barrett PA-C in January of this year. States that she has been doing well, exercises at the gym 2-3 days a week. She does not report any exertional chest pain or breathlessness beyond NYHA class II.  We reviewed her medications. She continues on Plavix, Lipitor, clonidine, Lasix, Toprol-XL, and lisinopril.  We discussed risk factor modification strategies.  Past Medical History  Diagnosis Date  . Essential hypertension   . History of blood transfusion   . Diabetes mellitus with diabetic retinopathy   . Depression   . Anemia   . CKD (chronic kidney disease) stage 3, GFR 30-59 ml/min   . COPD (chronic obstructive pulmonary disease) ([place])   . History of stroke April 2016    Acute lacunar infarct of the left thalamus  . TIA (transient ischemic attack)   . Asystole  ([place])     a. 05/2015: pt developed bradycardia->asystole during [place] nuclear stress test, s/p brief code. Cath with only 30% prox LCx. Her asystole during Lexiscan infusion was felt to represent an excessive pharmacologic response to the agent and likely superimposed vasovagal response.  . Mild CAD     a. LHC 05/2015: 30% prox Cx, otherwise widely patent.  . Morbid obesity ([place])   . History of stroke     Past Surgical History  Procedure Laterality Date  . Dilation and curettage of uterus    . Cesarean section      x2  . Tubal ligation    . Pars plana vitrectomy  04/20/2011    Procedure: PARS PLANA VITRECTOMY WITH 25 GAUGE;  Surgeon: Hayden Pedro, MD;  Location: [facility];  Service: Ophthalmology;  Laterality: Left;  membrane peel, gas fluid exchange, endolaser, repair of complex retinal detachment  . Eye surgery      Right eye pars plano vitrectomy   . Lasik    . Cardiac catheterization N/A 06/01/2015    Procedure: Left Heart Cath and Coronary Angiography;  Surgeon: Belva Crome, MD; CFX 30%, no other dz, EF nl by echo    Current Outpatient Prescriptions  Medication Sig Dispense Refill  . albuterol (PROVENTIL HFA;VENTOLIN HFA) 108 (90 BASE) MCG/ACT inhaler Inhale 1-2 puffs into the lungs every 6 (six) hours as needed for wheezing or shortness of breath. 1 Inhaler 0  . atorvastatin (LIPITOR) 80 MG tablet Take 1 tablet (80 mg total) by mouth daily at 6 PM. 30 tablet 6  . cloNIDine (CATAPRES) 0.2 MG tablet Take  0.2 mg by mouth daily.    . clopidogrel (PLAVIX) 75 MG tablet Take 1 tablet (75 mg total) by mouth daily. 30 tablet 0  . FLUoxetine (PROZAC) 10 MG capsule Take 10 mg by mouth daily.    . furosemide (LASIX) 40 MG tablet Take 40 mg by mouth daily.    Marland Kitchen gabapentin (NEURONTIN) 300 MG capsule Take 1 capsule (300 mg total) by mouth at bedtime as needed (For numbness and tingling).    . insulin aspart (NOVOLOG) 100 UNIT/ML FlexPen Inject 0-18 Units into the skin 3 (three) times daily  with meals.    . Insulin Detemir (LEVEMIR) 100 UNIT/ML Pen Inject 60 Units into the skin at bedtime.    Marland Kitchen lisinopril-hydrochlorothiazide (PRINZIDE,ZESTORETIC) 20-12.5 MG per tablet Take 1 tablet by mouth daily.    . metoprolol succinate (TOPROL-XL) 25 MG 24 hr tablet Take 25 mg by mouth daily.    Marland Kitchen tiotropium (SPIRIVA) 18 MCG inhalation capsule Place 18 mcg into inhaler and inhale daily.    . traZODone (DESYREL) 100 MG tablet Take 100 mg by mouth at bedtime.     No current facility-administered medications for this visit.   Allergies:  Lexiscan and Penicillins   Social History: The patient  reports that she quit smoking about 17 months ago. Her smoking use included Cigarettes. She started smoking about 34 years ago. She has a .9 pack-year smoking history. She has never used smokeless tobacco. She reports that she does not drink alcohol or use illicit drugs.   ROS:  Please see the history of present illness. Otherwise, complete review of systems is positive for intermittent back pain. All other systems are reviewed and negative.   Physical Exam: VS:  BP 142/82 mmHg  Pulse 78  Ht 5\' 4"  (1.626 m)  Wt 270 lb (122.471 kg)  BMI 46.32 kg/m2  SpO2 97%  LMP 07/30/2015, BMI Body mass index is 46.32 kg/(m^2).  Wt Readings from Last 3 Encounters:  09/20/15 270 lb (122.471 kg)  08/23/15 275 lb (124.739 kg)  06/16/15 277 lb (125.646 kg)    General: Morbidly obese woman, appears comfortable at rest. HEENT: Conjunctiva and lids normal, oropharynx clear. Neck: Supple, no elevated JVP or carotid bruits, no thyromegaly. Lungs: Clear to auscultation, nonlabored breathing at rest. Cardiac: Regular rate and rhythm, no S3 or significant systolic murmur, no pericardial rub. Abdomen: Soft, nontender, bowel sounds present, no guarding or rebound. Extremities: No pitting edema, distal pulses 2+. Skin: Warm and dry. Musculoskeletal: No kyphosis. Neuropsychiatric: Alert and oriented x3, affect grossly  appropriate.  ECG: I personally reviewed the prior tracing from 06/01/2015 which showed normal sinus rhythm.  Recent Labwork: 04/12/2015: B Natriuretic Peptide 78.0 06/01/2015: ALT 17; AST 16; Hemoglobin 11.9*; Platelets 335 06/02/2015: BUN 19; Creatinine, Ser 1.70*; Potassium 4.5; Sodium 136     Component Value Date/Time   CHOL 245* 05/23/2015 0545   TRIG 278* 05/23/2015 0545   HDL 45 05/23/2015 0545   CHOLHDL 5.4 05/23/2015 0545   VLDL 56* 05/23/2015 0545   LDLCALC 144* 05/23/2015 0545    Other Studies Reviewed Today:  Cardiac catheterization 06/01/2015:         Prox Cx lesion, 30% stenosed.   The coronary arteries are widely patent with minimal plaque noted. 40 cc of contrast was used  Left ventricular end-diastolic pressure is mildly elevated. Left ventriculography was not performed in an attempt to minimize contrast exposure.  Asystole during adenosine infusion represents an excessive pharmacologic response to the agent and likely superimposed  vasovagal response.  Echocardiogram 09/04/2014: Study Conclusions  - Left ventricle: The cavity size was at the upper limits of normal. Systolic function was normal. The estimated ejection fraction was in the range of 55% to 60%. Wall motion was normal; there were no regional wall motion abnormalities. Features are consistent with a pseudonormal left ventricular filling pattern, with concomitant abnormal relaxation and increased filling pressure (grade 2 diastolic dysfunction). Doppler parameters are consistent with high ventricular filling pressure. - Aortic valve: There was mild regurgitation. - Left atrium: The atrium was mildly dilated. Volume/bsa, ES, (1-plane Simpson&'s, A2C): 29.6 ml/m^2. - Atrial septum: No defect or patent foramen ovale was identified.  Chest x-ray 08/23/2015: FINDINGS: The heart size and mediastinal contours are within normal limits. Both lungs are clear. The visualized skeletal  structures are unremarkable.  IMPRESSION: No active cardiopulmonary disease.  Assessment and Plan:  1. Mild coronary atherosclerosis documented by cardiac catheterization in December 2016. This is unlikely to be causing her any specific symptoms at this time. Would recommend continued risk factor modification including exercise, management of hypertension, glucose, and lipids. Keep follow-up with PCP. We can see her back in one year.  2. Essential hypertension, no changes made to current regimen. Further weight loss would be beneficial.  3. Type 2 diabetes mellitus, on insulin. Keep follow-up with PCP.  4. Previous history of stroke, she continues on Plavix.  Current medicines were reviewed with the patient today.  Disposition: FU with me in 1 year.   Signed, Satira Sark, MD, [facility] 09/20/2015 8:31 AM    [place] [facility] [address] [place], [place] [address] Phone: [phone]; Fax: [phone]

## 2015-09-29 ENCOUNTER — Ambulatory Visit (HOSPITAL_COMMUNITY): Payer: Medicaid Other

## 2015-09-29 ENCOUNTER — Ambulatory Visit (HOSPITAL_COMMUNITY): Payer: Medicaid Other | Attending: Neurology | Admitting: Physical Therapy

## 2015-09-29 DIAGNOSIS — R29898 Other symptoms and signs involving the musculoskeletal system: Secondary | ICD-10-CM

## 2015-09-29 DIAGNOSIS — M6281 Muscle weakness (generalized): Secondary | ICD-10-CM | POA: Diagnosis present

## 2015-09-29 DIAGNOSIS — R262 Difficulty in walking, not elsewhere classified: Secondary | ICD-10-CM | POA: Diagnosis not present

## 2015-09-29 DIAGNOSIS — M25611 Stiffness of right shoulder, not elsewhere classified: Secondary | ICD-10-CM

## 2015-09-29 DIAGNOSIS — R2689 Other abnormalities of gait and mobility: Secondary | ICD-10-CM | POA: Insufficient documentation

## 2015-09-29 DIAGNOSIS — R2681 Unsteadiness on feet: Secondary | ICD-10-CM | POA: Diagnosis present

## 2015-09-29 NOTE — Therapy (Signed)
[facility] [place], [place], [address] Phone: [phone]   Fax:  [phone]  Physical Therapy Evaluation  Patient Details  Name: Samantha Pierce MRN: [id number] Date of Birth: 09-22-1970 Referring Provider: [name]   Encounter Date: 09/29/2015      PT End of Session - 09/29/15 0912    Visit Number 1   Number of Visits 16   Date for PT Re-Evaluation 10/27/15   Authorization Type Medicaid (submitted, waiting for response)   Authorization Time Period 09/29/15 to 11/29/15   PT Start Time 0815   PT Stop Time 0900   PT Time Calculation (min) 45 min   Activity Tolerance Patient tolerated treatment well   Behavior During Therapy [facility] for tasks assessed/performed      Past Medical History  Diagnosis Date  . Essential hypertension   . History of blood transfusion   . Diabetes mellitus with diabetic retinopathy   . Depression   . Anemia   . CKD (chronic kidney disease) stage 3, GFR 30-59 ml/min   . COPD (chronic obstructive pulmonary disease) ([place])   . History of stroke April 2016    Acute lacunar infarct of the left thalamus  . TIA (transient ischemic attack)   . Asystole ([place])     a. 05/2015: pt developed bradycardia->asystole during [place] nuclear stress test, s/p brief code. Cath with only 30% prox LCx. Her asystole during Lexiscan infusion was felt to represent an excessive pharmacologic response to the agent and likely superimposed vasovagal response.  . Mild CAD     a. LHC 05/2015: 30% prox Cx, otherwise widely patent.  . Morbid obesity ([place])   . History of stroke     Past Surgical History  Procedure Laterality Date  . Dilation and curettage of uterus    . Cesarean section      x2  . Tubal ligation    . Pars plana vitrectomy  04/20/2011    Procedure: PARS PLANA VITRECTOMY WITH 25 GAUGE;  Surgeon: Hayden Pedro, MD;  Location: [place] [place];  Service: Ophthalmology;  Laterality: Left;  membrane peel, gas  fluid exchange, endolaser, repair of complex retinal detachment  . Eye surgery      Right eye pars plano vitrectomy   . Lasik    . Cardiac catheterization N/A 06/01/2015    Procedure: Left Heart Cath and Coronary Angiography;  Surgeon: Belva Crome, MD; CFX 30%, no other dz, EF nl by echo    There were no vitals filed for this visit.       Subjective Assessment - 09/29/15 0819    Subjective Patient reports that she had a TIA in december and april of 2016; from the TIA in april she did come to PT and speech therapy, as well as OT. She reports that her R UE is still feeling weak, and that pain in her R UE has increased and she has more difficulty since her TIA in 2016, also more difficulty with gripping with R UE since December TIA.  Has been doing water aerobics and exercise at the gym so far. USing her R UE is difficult , feels that her coordination and balance have been a little off stilll. No falls or close calls recently.    Pertinent History DM, multiple strokes/TIAs, HTN, CKD stage 3, obesity, neuropathy, COPD    Patient Stated Goals getting R UE more functional and moving better    Currently in Pain? No/denies  [facility] PT Assessment - 09/29/15 0001    Assessment   Medical Diagnosis balance and gait training, R UE ROM and strength    Referring Provider Kofi Doonquah    Onset Date/Surgical Date --  December 2016   Next MD Visit May with Dr. Merlene Laughter    Precautions   Precautions Fall   Restrictions   Weight Bearing Restrictions No   Balance Screen   Has the patient fallen in the past 6 months No   Has the patient had a decrease in activity level because of a fear of falling?  No   Is the patient reluctant to leave their home because of a fear of falling?  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    Ambulation/Gait   Gait Comments reduced heel-toe, reduced rotation of trunk and pelvis, swaying gait pattern with reduced motion of hips and knees, reduced ankle DF, unsteadiness    High Level Balance   High Level Balance Comments unable to maintain SLS; tandem stance 1-8 seconds; TUG 15.47 seconds no device                            PT Education - 09/29/15 0911    Education provided Yes   Education Details prognosis, extensive HEP, plan of care, possible limitations of Medicaid insurance    Person(s) Educated Patient   Methods Explanation;Demonstration;Handout   Comprehension Verbalized understanding;Returned demonstration;Need further instruction          PT Short Term Goals - 09/29/15 [id number]    PT SHORT TERM GOAL #1   Title Patient to be able to consistently and correctly ambulate with SPC over even and unever surfaces for unlimited distances in order to enhance moiblity and reduce fall risk    Time 4   Period Weeks   Status New   PT SHORT TERM GOAL #2   Title Patient to be able to maintain SLS for at least 15 seconds each LE and tandem stance for at leaset 30 seconds each LE in order to reduce fall risk    Time 4   Period Weeks   Status New   PT SHORT TERM GOAL #3   Title Patient to report she has been able to more easily carry load of groceries or laundry with minimal fatigue R UE and hand, and good mechanics, in order to improve functional task performance in community and at home    Time 4   Period Weeks   Status New   PT SHORT TERM GOAL #4   Title Patient to be independent in correctly and consistently performing appropraite HEP, to be updated PRN    Time 1   Period Days   Status New           PT Long Term Goals - 09/29/15 [id number]    PT  LONG TERM GOAL #1   Title Patient to demonstrate strength at least 4/5 strength in all tested muscle groups in order to enhance gait and functional task perforamnce skills with minimal fall risk    Time 8   Period Weeks  Status New   PT LONG TERM GOAL #2   Title Patient to display correct heel toe walking pattern with minimal unsteadiness, improved gait speed, improved rotation of turnk and pelvis with no assistive device in order to enhance efficiency of overall mobility    Time 8   Period Weeks   Status New   PT LONG TERM GOAL #3   Title Patient to be able to maintain SLS for at least 30 seconds each LE and tandem stance at least 45 seconds each way in order to demosntrate improved balance and reduced fall risk    Time 8   Period Weeks   Status New   PT LONG TERM GOAL #4   Title Patient to be compliant with extensive strengthening program at gym with supervision of personal trainer in order to maintain strength gains, assist in weight loss goals, and assist in improving overall level of fuction               Plan - 09/29/15 0914    Clinical Impression Statement Patient arrives today reporting that since her new TIA in December 2016, she has had more problems with her R UE strength and grip, as well as more issues with her balance, walking, and coordination. Performed general objective measures of strength, gait, R UE stiffness and balance today, which revealed generalized weakness, gait impairment, unsteadiness, and indeed reduced coordionation at this time. Patient reports she spends a great deal of time on cardio activity at the gym, so spent a great deal of time today designing appropriate strengthening and balance HEP today; also spent some time educating patient on correct use of SPC today as she reports this has been difficult for her. At this time recommend skilled PT services in order to address functional deficits, assist in reaching optimal level of function, and reducing  fall risk.    Rehab Potential Good   PT Frequency 2x / week   PT Duration 8 weeks   PT Treatment/Interventions ADLs/Self Care Home Management;Biofeedback;DME Instruction;Gait training;Stair training;Functional mobility training;Therapeutic activities;Therapeutic exercise;Balance training;Neuromuscular re-education;Patient/family education;Manual techniques;Energy conservation;Taping   PT Next Visit Plan check Medicaid; gait and balance training, functional strength, progress HEP PRN    PT Home Exercise Plan given    Consulted and Agree with Plan of Care Patient      Patient will benefit from skilled therapeutic intervention in order to improve the following deficits and impairments:  Abnormal gait, Hypomobility, Obesity, Decreased activity tolerance, Decreased strength, Pain, Decreased balance, Decreased mobility, Difficulty walking, Improper body mechanics, Decreased coordination, Impaired flexibility, Postural dysfunction  Visit Diagnosis: Difficulty in walking, not elsewhere classified - Plan: PT plan of care cert/re-cert  Unsteadiness on feet - Plan: PT plan of care cert/re-cert  Muscle weakness (generalized) - Plan: PT plan of care cert/re-cert  Other abnormalities of gait and mobility - Plan: PT plan of care cert/re-cert     Problem List Patient Active Problem List   Diagnosis Date Noted  . Mild CAD 06/02/2015  . Bradycardia 06/02/2015  . Morbid obesity ([place])   . Asystole ([place])   . Pain in the chest   . Hyperlipidemia   . TIA (transient ischemic attack) 05/22/2015  . Diastolic dysfunction 99991111  . Essential hypertension 09/04/2014  . Diabetes mellitus with diabetic retinopathy 09/04/2014  . Lacunar stroke, acute ([place]) 09/04/2014  . Paresthesias/numbness 09/04/2014  . CKD (chronic kidney disease), stage III 09/04/2014  . Obesity 09/04/2014  . CVA (cerebral infarction) 09/04/2014  . Proliferative diabetic retinopathy of  both eyes ([place]) 04/11/2011  . Retinal  detachment, traction 04/11/2011    Deniece Ree PT, DPT [facility] [address] [place], [place], [address] Phone: [phone]   Fax:  [phone]  Name: Samantha Pierce MRN: [id number] Date of Birth: 1970-07-08

## 2015-09-29 NOTE — Patient Instructions (Signed)
   FLEXION IN NEUTRAL ROTATION  Hold a free weight in both hands and with your elbows straight and down by your side. Your palms should be facing inward towards the side of you body.   Next, slowly raise one of them up in front of your body. Then lower to starting position and then repeat on the other side.  Alternate to the other side after each repetition.   Do not let your shoulder shrug upwards unless instructed to go over shoulder level height.   Repeat 8-10 times, 2 sets, once every other day. Start with a 1-2 pound weight and work your way up; when you can do 12 repetitions with a certain weight, you may progress it.    FREE WEIGHT -  ABDUCTION  While holding a weight in your right hands and with your elbows straight, bring up your arms up from your side with the thumb upwards.   Do not let your shoulder shrug upwards and do not go over shoulder level height.   Repeat 8 times, for 2 sets, every other day; when you can do 12 reps easily with one weight, you may progress it.    BICEP CURLS  With your arm at your side, draw up your hand by bending at the elbow.   Keep your palm face up the entire time.  Repeat 8 times, 2 sets; when you can perform 12 repetitions easily with one weight you may progress it.    HIP ABDUCTION - SIDELYING  While lying on your side, slowly raise up your top leg to the side. Keep your knee straight and maintain your toes pointed forward the entire time. Keep your leg in-line with your body.  The bottom leg can be bent to stabilize your body.  Repeat 10 times, twice each side, twice a day.    PRONE HIP EXTENSION  While lying face down with your knee straight, slowly raise up leg off the ground.  Repeat 10 times each leg, twice a day.    WEIGHT SHIFT - LATERAL  While in a standing position and knees partially bent, slowly shift your body weight side-to-side.   Repeat 20 times, twice a day.    TANDEM STANCE WITH SUPPORT  Stand  in front of a chair, table or counter top for support. Then place the heel of one foot so that it is touching the toes of the other foot. Maintain your balance in this position.   Hold as long as you can, 3 times each side, twice a day.    SINGLE LEG STANCE - SLS  Stand on one leg and maintain your balance. Use your hands as much as you need to to keep your balance; as you get better over time, you may take support away.  Hold as long as you can, 3 times each side, twice a day.

## 2015-09-29 NOTE — Patient Instructions (Signed)
Hold each stretch for 10 seconds, complete each stretch 2-3 times per day.  Doorway Stretch  Place each hand opposite each other on the doorway. (You can change where you feel the stretch by moving arms higher or lower.) Step through with one foot and bend front knee until a stretch is felt and hold. Step through with the opposite foot on the next rep.     Scapular Retraction (Standing)   With arms at sides, pinch shoulder blades together.   http://orth.exer.us/944   Copyright  VHI. All rights reserved.   Internal Rotation Across Back  Grab the end of a towel with your affected side, palm facing backwards. Grab the towel with your unaffected side and pull your affected hand across your back until you feel a stretch in the front of your shoulder. If you feel pain, pull just to the pain, do not pull through the pain. Hold. Return your affected arm to your side. Try to keep your hand/arm close to your body during the entire movement.            Posterior Capsule Stretch   Stand or sit, one arm across body so hand rests over opposite shoulder. Gently push on crossed elbow with other hand until stretch is felt in shoulder of crossed arm.    Wall Flexion  Using a towel, slide your arm up the wall until a stretch is felt in your shoulder .     Wall external rotation stretch  Place your affected hand on the wall with the elbow bent and gently turn your body the opposite direction until a stretch is felt.     Stargazer stretch laying down

## 2015-09-29 NOTE — Therapy (Signed)
[place], [place], [address] Phone: [phone]   Fax:  [phone]  Patient Details  Name: Samantha Pierce MRN: [id number] Date of Birth: 1970-08-21 Referring Provider:  [name], MD  Encounter Date: 09/29/2015  OT screen completed this date. Patient completed PT evaluation prior to OT visit. PT educated patient on UE strengthening exercises as she was unaware that patient was seeing OT as well. Patient presents decreased RUE ROM S/P CVA which occurred in 2014. Patient was given UE shoulder stretches and educated with demonstration given and returned by patient. Patient was encouraged to call clinic if she has any questions regarding the stretches. Patient's primary insurance is Medicaid which only covers the evaluation therefore no further OT services warranted at this time due to patient's inability to pay out of pocket.   Ailene Ravel, OTR/L,CBIS  [phone]  09/29/2015, 9:28 AM  [facility] [address] [place], [place], [address] Phone: [phone]   Fax:  [phone]

## 2015-10-11 ENCOUNTER — Telehealth (HOSPITAL_COMMUNITY): Payer: Self-pay

## 2015-10-11 NOTE — Telephone Encounter (Signed)
10/11/15 Called and let her know that Medicaid denied authorization for more visits.

## 2015-12-01 ENCOUNTER — Other Ambulatory Visit (HOSPITAL_COMMUNITY): Payer: Self-pay | Admitting: Respiratory Therapy

## 2015-12-01 DIAGNOSIS — G4733 Obstructive sleep apnea (adult) (pediatric): Secondary | ICD-10-CM

## 2016-01-31 DIAGNOSIS — Z139 Encounter for screening, unspecified: Secondary | ICD-10-CM

## 2016-01-31 LAB — GLUCOSE, POCT (MANUAL RESULT ENTRY): POC Glucose: 182 mg/dl — AB (ref 70–99)

## 2016-01-31 NOTE — Congregational Nurse Program (Signed)
Congregational Nurse Program Note  Date of Encounter: 01/31/2016  Past Medical History: Past Medical History:  Diagnosis Date  . Anemia   . Asystole ([place])    a. 05/2015: pt developed bradycardia->asystole during [place] nuclear stress test, s/p brief code. Cath with only 30% prox LCx. Her asystole during Lexiscan infusion was felt to represent an excessive pharmacologic response to the agent and likely superimposed vasovagal response.  . CKD (chronic kidney disease) stage 3, GFR 30-59 ml/min   . COPD (chronic obstructive pulmonary disease) ([place])   . Depression   . Diabetes mellitus with diabetic retinopathy   . Essential hypertension   . History of blood transfusion   . History of stroke April 2016   Acute lacunar infarct of the left thalamus  . History of stroke   . Mild CAD    a. LHC 05/2015: 30% prox Cx, otherwise widely patent.  . Morbid obesity ([place])   . TIA (transient ischemic attack)     Encounter Details:     CNP Questionnaire - 01/31/16 1551      Patient Demographics   Is this a new or existing patient? New   Patient is considered a/an Not Applicable   Race African-American/Black     Patient Assistance   Location of Patient [place]   Patient's financial/insurance status Low Income;Medicaid   Uninsured Patient No   Patient referred to apply for the following financial assistance Not Applicable   Food insecurities addressed Not Applicable   Transportation assistance No   Assistance securing medications No   Educational health offerings Hypertension     Encounter Details   Primary purpose of visit Acute Illness/Condition Visit   Was an Emergency Department visit averted? Not Applicable   Does patient have a medical provider? Yes   Patient referred to Other (comment)  patient was assisted in making a appt with her nephrologist   Was a mental health screening completed? (GAINS tool) No   Does patient have dental issues? No   Does patient  have vision issues? No   Since previous encounter, have you referred patient for abnormal blood pressure that resulted in a new diagnosis or medication change? No   Does your patient have an abnormal blood glucose today? Yes   Since previous encounter, have you referred patient for abnormal blood glucose that resulted in a new diagnosis or medication change? No   Was there a life-saving intervention made? No      Patient came in about 10:30a stating she wanted to be seen for a ear infection, check Bp, and see if she could get lisinopril. Ht, Wt, Bp, and glucose were checked. Assisted with making a appointment with her nephrologist. Appt was made for 02/01/16 for her complaints and medication managment the. At this time we do not have the capability in checking her ears, but advised her to go with her PCP since patient does have one. Patients BP was 175/103 on left arm, 184/111 on right arm and 175/105 before leaving the center. Advised her to take her blood pressure medicine since she had not taken them prior to arriving. Advised her to come back to recheck her blood pressure.

## 2016-02-01 ENCOUNTER — Ambulatory Visit: Payer: Medicaid Other | Attending: Neurology | Admitting: Neurology

## 2016-02-01 DIAGNOSIS — Z09 Encounter for follow-up examination after completed treatment for conditions other than malignant neoplasm: Secondary | ICD-10-CM

## 2016-02-01 DIAGNOSIS — G4733 Obstructive sleep apnea (adult) (pediatric): Secondary | ICD-10-CM | POA: Insufficient documentation

## 2016-02-01 DIAGNOSIS — G4761 Periodic limb movement disorder: Secondary | ICD-10-CM | POA: Diagnosis not present

## 2016-02-01 DIAGNOSIS — E669 Obesity, unspecified: Secondary | ICD-10-CM

## 2016-02-02 NOTE — Congregational Nurse Program (Signed)
Congregational Nurse Program Note  Date of Encounter: 02/01/2016  Past Medical History: Past Medical History:  Diagnosis Date  . Anemia   . Asystole ([place])    a. 05/2015: pt developed bradycardia->asystole during [place] nuclear stress test, s/p brief code. Cath with only 30% prox LCx. Her asystole during Lexiscan infusion was felt to represent an excessive pharmacologic response to the agent and likely superimposed vasovagal response.  . CKD (chronic kidney disease) stage 3, GFR 30-59 ml/min   . COPD (chronic obstructive pulmonary disease) ([place])   . Depression   . Diabetes mellitus with diabetic retinopathy   . Essential hypertension   . History of blood transfusion   . History of stroke April 2016   Acute lacunar infarct of the left thalamus  . History of stroke   . Mild CAD    a. LHC 05/2015: 30% prox Cx, otherwise widely patent.  . Morbid obesity ([place])   . TIA (transient ischemic attack)     Encounter Details:     CNP Questionnaire - 02/01/16 1500      Patient Demographics   Is this a new or existing patient? Existing   Patient is considered a/an Not Applicable   Race African-American/Black     Patient Assistance   Location of Patient [place]   Patient's financial/insurance status Low Income;Medicaid   Uninsured Patient No   Patient referred to apply for the following financial assistance Not Applicable   Food insecurities addressed Not Applicable   Transportation assistance No   Assistance securing medications Yes   Type of [place]   Educational health offerings Chronic disease;Navigating the healthcare system     Encounter Details   Primary purpose of visit Chronic Illness/Condition Visit;Navigating the Healthcare System;Other  post follow up from nephrologist appt   Was an Emergency Department visit averted? Not Applicable   Does patient have a medical provider? Yes   Patient referred to Not Applicable   Was a  mental health screening completed? (GAINS tool) No   Does patient have vision issues? Yes   Does your patient have an abnormal blood pressure today? Yes   Since previous encounter, have you referred patient for abnormal blood pressure that resulted in a new diagnosis or medication change? Yes   Does your patient have an abnormal blood glucose today? No   Since previous encounter, have you referred patient for abnormal blood glucose that resulted in a new diagnosis or medication change? No   Was there a life-saving intervention made? No      Client in to follow up after her nephrologist appointment from today. She states that her Nephrologist said she is doing well , her blood pressure was much improved from the day prior when it was checked her at [facility]. She states that her provider does wish for her to remain on Lisinopril and that she needs assistance with her medicaid co-payment. [facility] did assist with a one time assistance with her co-payment and referral form given to client to take to [facility]. Client informed that we can only offer a one time assistance and she understands. Client to come in as needed just for blood pressure checks. She will remain following with all her medical providers that she has now.

## 2016-02-05 NOTE — Procedures (Signed)
[place] A. Merlene Laughter, MD     [email or web address]             NOCTURNAL POLYSOMNOGRAPHY   LOCATION: [facility]  Patient Name: Joycie, Zunker Date: 02/01/2016 Gender: Female D.O.B: 12-14-1970 Age (years): 65 Referring Provider: Not Available Height (inches): 64 Interpreting Physician: [name] MD, ABSM Weight (lbs): 277 RPSGT: Rosebud Poles BMI: 48 MRN: [id number] Neck Size: 16.50 CLINICAL INFORMATION Sleep Study Type: NPSG Indication for sleep study: N/A Epworth Sleepiness Score: 18  Most recent polysomnogram dated 06/16/2015 revealed an AHI of 0.5/h and RDI of 0.5/h. SLEEP STUDY TECHNIQUE As per the AASM Manual for the Scoring of Sleep and Associated Events v2.3 (April 2016) with a hypopnea requiring 4% desaturations. The channels recorded and monitored were frontal, central and occipital EEG, electrooculogram (EOG), submentalis EMG (chin), nasal and oral airflow, thoracic and abdominal wall motion, anterior tibialis EMG, snore microphone, electrocardiogram, and pulse oximetry. MEDICATIONS Patient's medications include: N/A. Medications self-administered by patient during sleep study : Sleep medicine administered - Trazodone at 10:43:34 PM  Current Outpatient Prescriptions:  .  albuterol (PROVENTIL HFA;VENTOLIN HFA) 108 (90 BASE) MCG/ACT inhaler, Inhale 1-2 puffs into the lungs every 6 (six) hours as needed for wheezing or shortness of breath., Disp: 1 Inhaler, Rfl: 0 .  atorvastatin (LIPITOR) 80 MG tablet, Take 1 tablet (80 mg total) by mouth daily at 6 PM., Disp: 30 tablet, Rfl: 6 .  cloNIDine (CATAPRES) 0.2 MG tablet, Take 0.2 mg by mouth daily., Disp: , Rfl:  .  clopidogrel (PLAVIX) 75 MG tablet, Take 1 tablet (75 mg total) by mouth daily., Disp: 30 tablet, Rfl: 0 .  FLUoxetine (PROZAC) 10 MG capsule, Take 10 mg by mouth daily., Disp: , Rfl:  .  furosemide (LASIX) 40 MG tablet, Take 40 mg by mouth daily., Disp: , Rfl:  .  gabapentin  (NEURONTIN) 300 MG capsule, Take 1 capsule (300 mg total) by mouth at bedtime as needed (For numbness and tingling)., Disp: , Rfl:  .  insulin aspart (NOVOLOG) 100 UNIT/ML FlexPen, Inject 0-18 Units into the skin 3 (three) times daily with meals., Disp: , Rfl:  .  Insulin Detemir (LEVEMIR) 100 UNIT/ML Pen, Inject 60 Units into the skin at bedtime., Disp: , Rfl:  .  lisinopril-hydrochlorothiazide (PRINZIDE,ZESTORETIC) 20-12.5 MG per tablet, Take 1 tablet by mouth daily., Disp: , Rfl:  .  metoprolol succinate (TOPROL-XL) 25 MG 24 hr tablet, Take 25 mg by mouth daily., Disp: , Rfl:  .  tiotropium (SPIRIVA) 18 MCG inhalation capsule, Place 18 mcg into inhaler and inhale daily., Disp: , Rfl:  .  traZODone (DESYREL) 100 MG tablet, Take 100 mg by mouth at bedtime., Disp: , Rfl:   SLEEP ARCHITECTURE The study was initiated at 10:35:37 PM and ended at 4:31:45 AM. Sleep onset time was 0.0 minutes and the sleep efficiency was 81.1%. The total sleep time was 288.8 minutes. Stage REM latency was 55.3 minutes. The patient spent 0.69% of the night in stage N1 sleep, 72.82% in stage N2 sleep, 9.00% in stage N3 and 17.49% in REM. Alpha intrusion was absent. Supine sleep was 12.57%. RESPIRATORY PARAMETERS The overall apnea/hypopnea index (AHI) was 0.0 per hour. There were 0 total apneas, including 0 obstructive, 0 central and 0 mixed apneas. There were 0 hypopneas and 0 RERAs. The AHI during Stage REM sleep was 0.0 per hour. AHI while supine was 0.0 per hour. The mean oxygen saturation was 96.85%. The minimum SpO2 during sleep was 93.00%. Moderate snoring was  noted during this study. CARDIAC DATA The 2 lead EKG demonstrated sinus rhythm. The mean heart rate was N/A beats per minute. Other EKG findings include: PVCs. LEG MOVEMENT DATA The total PLMS were 648 with a resulting PLMS index of 134.63. Associated arousal with leg movement index was 4.6.     IMPRESSIONS Pathologically early sleep latency  suggestive of narcolepsy and/or hypersomnia.  Severe periodic limb movement disorder.   Delano Metz, MD Diplomate, American Board of Sleep Medicine.

## 2016-07-14 ENCOUNTER — Other Ambulatory Visit (HOSPITAL_COMMUNITY): Payer: Self-pay | Admitting: Internal Medicine

## 2016-07-14 DIAGNOSIS — Z1231 Encounter for screening mammogram for malignant neoplasm of breast: Secondary | ICD-10-CM

## 2016-07-21 ENCOUNTER — Ambulatory Visit (HOSPITAL_COMMUNITY): Payer: Medicaid Other

## 2016-07-24 ENCOUNTER — Encounter (HOSPITAL_COMMUNITY): Payer: Self-pay

## 2016-07-24 ENCOUNTER — Ambulatory Visit (HOSPITAL_COMMUNITY)
Admission: RE | Admit: 2016-07-24 | Discharge: 2016-07-24 | Disposition: A | Discharge status: Home / Self Care | Payer: Medicaid Other | Source: Ambulatory Visit | Attending: Internal Medicine | Admitting: Internal Medicine

## 2016-07-24 DIAGNOSIS — Z1231 Encounter for screening mammogram for malignant neoplasm of breast: Secondary | ICD-10-CM | POA: Insufficient documentation

## 2016-08-07 ENCOUNTER — Emergency Department (HOSPITAL_COMMUNITY): Payer: Medicaid Other

## 2016-08-07 ENCOUNTER — Encounter (HOSPITAL_COMMUNITY): Payer: Self-pay | Admitting: Emergency Medicine

## 2016-08-07 ENCOUNTER — Emergency Department (HOSPITAL_COMMUNITY)
Admission: EM | Admit: 2016-08-07 | Discharge: 2016-08-07 | Disposition: A | Discharge status: Home / Self Care | Payer: Medicaid Other | Attending: Emergency Medicine | Admitting: Emergency Medicine

## 2016-08-07 DIAGNOSIS — J449 Chronic obstructive pulmonary disease, unspecified: Secondary | ICD-10-CM | POA: Diagnosis not present

## 2016-08-07 DIAGNOSIS — X58XXXA Exposure to other specified factors, initial encounter: Secondary | ICD-10-CM | POA: Diagnosis not present

## 2016-08-07 DIAGNOSIS — I129 Hypertensive chronic kidney disease with stage 1 through stage 4 chronic kidney disease, or unspecified chronic kidney disease: Secondary | ICD-10-CM | POA: Insufficient documentation

## 2016-08-07 DIAGNOSIS — N189 Chronic kidney disease, unspecified: Secondary | ICD-10-CM

## 2016-08-07 DIAGNOSIS — Y939 Activity, unspecified: Secondary | ICD-10-CM | POA: Insufficient documentation

## 2016-08-07 DIAGNOSIS — E1122 Type 2 diabetes mellitus with diabetic chronic kidney disease: Secondary | ICD-10-CM | POA: Diagnosis not present

## 2016-08-07 DIAGNOSIS — Z87891 Personal history of nicotine dependence: Secondary | ICD-10-CM | POA: Insufficient documentation

## 2016-08-07 DIAGNOSIS — I2510 Atherosclerotic heart disease of native coronary artery without angina pectoris: Secondary | ICD-10-CM | POA: Insufficient documentation

## 2016-08-07 DIAGNOSIS — M542 Cervicalgia: Secondary | ICD-10-CM | POA: Diagnosis present

## 2016-08-07 DIAGNOSIS — Y929 Unspecified place or not applicable: Secondary | ICD-10-CM | POA: Diagnosis not present

## 2016-08-07 DIAGNOSIS — Z794 Long term (current) use of insulin: Secondary | ICD-10-CM | POA: Diagnosis not present

## 2016-08-07 DIAGNOSIS — Z79899 Other long term (current) drug therapy: Secondary | ICD-10-CM | POA: Insufficient documentation

## 2016-08-07 DIAGNOSIS — M25512 Pain in left shoulder: Secondary | ICD-10-CM | POA: Insufficient documentation

## 2016-08-07 DIAGNOSIS — S161XXA Strain of muscle, fascia and tendon at neck level, initial encounter: Secondary | ICD-10-CM | POA: Insufficient documentation

## 2016-08-07 DIAGNOSIS — Y999 Unspecified external cause status: Secondary | ICD-10-CM | POA: Insufficient documentation

## 2016-08-07 DIAGNOSIS — N183 Chronic kidney disease, stage 3 (moderate): Secondary | ICD-10-CM | POA: Diagnosis not present

## 2016-08-07 LAB — CBC WITH DIFFERENTIAL/PLATELET
Basophils Absolute: 0.1 10*3/uL (ref 0.0–0.1)
Basophils Relative: 1 %
Eosinophils Absolute: 0.4 10*3/uL (ref 0.0–0.7)
Eosinophils Relative: 5 %
HCT: 37 % (ref 36.0–46.0)
Hemoglobin: 11.8 g/dL — ABNORMAL LOW (ref 12.0–15.0)
Lymphocytes Relative: 17 %
Lymphs Abs: 1.5 10*3/uL (ref 0.7–4.0)
MCH: 25.3 pg — ABNORMAL LOW (ref 26.0–34.0)
MCHC: 31.9 g/dL (ref 30.0–36.0)
MCV: 79.4 fL (ref 78.0–100.0)
Monocytes Absolute: 0.6 10*3/uL (ref 0.1–1.0)
Monocytes Relative: 7 %
Neutro Abs: 6.4 10*3/uL (ref 1.7–7.7)
Neutrophils Relative %: 72 %
Platelets: 308 10*3/uL (ref 150–400)
RBC: 4.66 MIL/uL (ref 3.87–5.11)
RDW: 13 % (ref 11.5–15.5)
WBC: 9 10*3/uL (ref 4.0–10.5)

## 2016-08-07 LAB — BASIC METABOLIC PANEL
Anion gap: 7 (ref 5–15)
BUN: 28 mg/dL — ABNORMAL HIGH (ref 6–20)
CO2: 25 mmol/L (ref 22–32)
Calcium: 8.5 mg/dL — ABNORMAL LOW (ref 8.9–10.3)
Chloride: 98 mmol/L — ABNORMAL LOW (ref 101–111)
Creatinine, Ser: 2.5 mg/dL — ABNORMAL HIGH (ref 0.44–1.00)
GFR calc Af Amer: 25 mL/min — ABNORMAL LOW (ref >60)
GFR calc non Af Amer: 22 mL/min — ABNORMAL LOW (ref >60)
Glucose, Bld: 488 mg/dL — ABNORMAL HIGH (ref 65–99)
Potassium: 4.5 mmol/L (ref 3.5–5.1)
Sodium: 130 mmol/L — ABNORMAL LOW (ref 135–145)

## 2016-08-07 LAB — TROPONIN I: Troponin I: <0.03 ng/mL (ref <0.03)

## 2016-08-07 MED ORDER — SODIUM CHLORIDE 0.9 % IV BOLUS (SEPSIS)
1000.0000 mL | Freq: Once | INTRAVENOUS | Status: AC
  Administered 2016-08-07: 1000 mL via INTRAVENOUS

## 2016-08-07 MED ORDER — METHOCARBAMOL 500 MG PO TABS: 500.0000 mg | ORAL_TABLET | Freq: Three times a day (TID) | ORAL | 0 refills | Status: DC

## 2016-08-07 MED ORDER — ONDANSETRON HCL 4 MG/2ML IJ SOLN
4.0000 mg | Freq: Once | INTRAMUSCULAR | Status: AC
  Administered 2016-08-07: 4 mg via INTRAVENOUS
  Filled 2016-08-07: qty 2

## 2016-08-07 MED ORDER — MORPHINE SULFATE (PF) 4 MG/ML IV SOLN
4.0000 mg | Freq: Once | INTRAVENOUS | Status: AC
  Administered 2016-08-07: 4 mg via INTRAVENOUS
  Filled 2016-08-07: qty 1

## 2016-08-07 MED ORDER — HYDROCODONE-ACETAMINOPHEN 5-325 MG PO TABS: ORAL_TABLET | ORAL | 0 refills | Status: DC

## 2016-08-07 NOTE — Discharge Instructions (Signed)
Alternate ice and heat to your neck and shoulder.  Be sure to keep your appt for later this week

## 2016-08-07 NOTE — ED Notes (Signed)
Pt ambulated with assistance to BR  

## 2016-08-07 NOTE — ED Provider Notes (Signed)
[facility] DEPT Provider Note   CSN: [id number] Arrival date & time: 08/07/16  0759     History   Chief Complaint Chief Complaint  Patient presents with  . Shoulder Pain    HPI Samantha Pierce is a 46 y.o. female.  HPI   Samantha Pierce is a 46 y.o. female who presents to the Emergency Department complaining of left neck and shoulder pain for 2 days.  She states that she woke Saturday morning with sharp pain to her left upper arm, shoulder and neck.  Pain is reproduced with movement, improves at rest.  She also complains of cough that is occasionally productive and "soreness" of the left upper chest.  Pain has been constant and she states does not feel similar to her previous TIA's.  She was seen last month by her PCP for left arm numbness and pain and was given a wrist splint and told that it may be related to carpal tunnel.  She has not tried any therapies for her symptoms.  She denies shortness of breath, abd pain, vomiting, visual changes, headaches, or slurred speech.     Past Medical History:  Diagnosis Date  . Anemia   . Asystole ([place])    a. 05/2015: pt developed bradycardia->asystole during [facility] nuclear stress test, s/p brief code. Cath with only 30% prox LCx. Her asystole during Lexiscan infusion was felt to represent an excessive pharmacologic response to the agent and likely superimposed vasovagal response.  . CKD (chronic kidney disease) stage 3, GFR 30-59 ml/min   . COPD (chronic obstructive pulmonary disease) ([place])   . Depression   . Diabetes mellitus with diabetic retinopathy   . Essential hypertension   . History of blood transfusion   . History of stroke April 2016   Acute lacunar infarct of the left thalamus  . History of stroke   . Mild CAD    a. LHC 05/2015: 30% prox Cx, otherwise widely patent.  . Morbid obesity ([place])   . TIA (transient ischemic attack)     Patient Active Problem List   Diagnosis Date Noted  . Mild CAD 06/02/2015  .  Bradycardia 06/02/2015  . Morbid obesity ([place])   . Asystole ([place])   . Pain in the chest   . Hyperlipidemia   . TIA (transient ischemic attack) 05/22/2015  . Diastolic dysfunction 09/73/5329  . Essential hypertension 09/04/2014  . Diabetes mellitus with diabetic retinopathy 09/04/2014  . Lacunar stroke, acute ([place]) 09/04/2014  . Paresthesias/numbness 09/04/2014  . CKD (chronic kidney disease), stage III 09/04/2014  . Obesity 09/04/2014  . CVA (cerebral infarction) 09/04/2014  . Proliferative diabetic retinopathy of both eyes ([place]) 04/11/2011  . Retinal detachment, traction 04/11/2011    Past Surgical History:  Procedure Laterality Date  . CARDIAC CATHETERIZATION N/A 06/01/2015   Procedure: Left Heart Cath and Coronary Angiography;  Surgeon: Belva Crome, MD; CFX 30%, no other dz, EF nl by echo  . CESAREAN SECTION     x2  . DILATION AND CURETTAGE OF UTERUS    . EYE SURGERY     Right eye pars plano vitrectomy   . LASIK    . PARS PLANA VITRECTOMY  04/20/2011   Procedure: PARS PLANA VITRECTOMY WITH 25 GAUGE;  Surgeon: Hayden Pedro, MD;  Location: [facility];  Service: Ophthalmology;  Laterality: Left;  membrane peel, gas fluid exchange, endolaser, repair of complex retinal detachment  . TUBAL LIGATION      OB History    Gravida Para  Term Preterm AB Living   2 2 2     2    SAB TAB Ectopic Multiple Live Births                   Home Medications    Prior to Admission medications   Medication Sig Start Date End Date Taking? Authorizing Provider  albuterol (PROVENTIL HFA;VENTOLIN HFA) 108 (90 BASE) MCG/ACT inhaler Inhale 1-2 puffs into the lungs every 6 (six) hours as needed for wheezing or shortness of breath. 06/20/14   Nat Christen, MD  atorvastatin (LIPITOR) 80 MG tablet Take 1 tablet (80 mg total) by mouth daily at 6 PM. 08/05/15   Satira Sark, MD  cloNIDine (CATAPRES) 0.2 MG tablet Take 0.2 mg by mouth daily.    Historical Provider, MD  clopidogrel (PLAVIX) 75 MG tablet  Take 1 tablet (75 mg total) by mouth daily. 05/24/15   Modena Jansky, MD  FLUoxetine (PROZAC) 10 MG capsule Take 10 mg by mouth daily.    Historical Provider, MD  furosemide (LASIX) 40 MG tablet Take 40 mg by mouth daily.    Historical Provider, MD  gabapentin (NEURONTIN) 300 MG capsule Take 1 capsule (300 mg total) by mouth at bedtime as needed (For numbness and tingling). 09/05/14   Rexene Alberts, MD  insulin aspart (NOVOLOG) 100 UNIT/ML FlexPen Inject 0-18 Units into the skin 3 (three) times daily with meals.    Historical Provider, MD  Insulin Detemir (LEVEMIR) 100 UNIT/ML Pen Inject 60 Units into the skin at bedtime. 05/24/15   Modena Jansky, MD  lisinopril-hydrochlorothiazide (PRINZIDE,ZESTORETIC) 20-12.5 MG per tablet Take 1 tablet by mouth daily.    Historical Provider, MD  metoprolol succinate (TOPROL-XL) 25 MG 24 hr tablet Take 25 mg by mouth daily.    Historical Provider, MD  tiotropium (SPIRIVA) 18 MCG inhalation capsule Place 18 mcg into inhaler and inhale daily.    Historical Provider, MD  traZODone (DESYREL) 100 MG tablet Take 100 mg by mouth at bedtime.    Historical Provider, MD    Family History Family History  Problem Relation Age of Onset  . Diabetes Mother     Social History Social History  Substance Use Topics  . Smoking status: Former Smoker    Packs/day: 0.03    Years: 30.00    Types: Cigarettes    Start date: 06/05/1981    Quit date: 04/04/2014  . Smokeless tobacco: Never Used  . Alcohol use No     Allergies   Lexiscan [regadenoson] and Penicillins   Review of Systems Review of Systems  Constitutional: Negative for chills and fever.  HENT: Negative for congestion and sore throat.   Eyes: Negative for visual disturbance.  Respiratory: Positive for cough. Negative for shortness of breath and wheezing.   Cardiovascular: Positive for chest pain.  Gastrointestinal: Negative for abdominal pain, nausea and vomiting.  Genitourinary: Negative for  difficulty urinating, dysuria and frequency.  Musculoskeletal: Positive for arthralgias and neck pain. Negative for joint swelling.  Skin: Negative for color change and wound.  Neurological: Positive for numbness. Negative for dizziness, syncope, speech difficulty, weakness and headaches.  All other systems reviewed and are negative.    Physical Exam Updated Vital Signs BP 183/92 (BP Location: Right Arm)   Pulse 70   Temp 98.4 F (36.9 C) (Oral)   Resp 18   Ht 5\' 4"  (1.626 m)   Wt 122.5 kg   SpO2 100%   BMI 46.35 kg/m   Physical Exam  Constitutional: She is oriented to person, place, and time. She appears well-developed and well-nourished. No distress.  HENT:  Head: Normocephalic and atraumatic.  Mouth/Throat: Oropharynx is clear and moist.  Eyes: EOM are normal. Pupils are equal, round, and reactive to light.  Neck: Phonation normal. No JVD present. Muscular tenderness present. No spinous process tenderness present. No neck rigidity. Decreased range of motion present. No erythema present. No Brudzinski's sign and no Kernig's sign noted. No thyromegaly present.  Cardiovascular: Normal rate, regular rhythm and intact distal pulses.   No murmur heard. Pulmonary/Chest: Effort normal and breath sounds normal. No respiratory distress. She exhibits no tenderness.  Abdominal: Soft. She exhibits no distension. There is no tenderness. There is no guarding.  Musculoskeletal: She exhibits tenderness. She exhibits no edema.       Cervical back: She exhibits tenderness. She exhibits normal range of motion, no bony tenderness, no swelling, no deformity, no spasm and normal pulse.  ttp of the left cervical paraspinal muscles and along the left SCM and trapezius muscle.  Pain reproduced with ROM of the left shoulder and abduction of the left arm  Lymphadenopathy:    She has no cervical adenopathy.  Neurological: She is alert and oriented to person, place, and time. She has normal strength. No  sensory deficit. She exhibits normal muscle tone. Coordination normal. GCS eye subscore is 4. GCS verbal subscore is 5. GCS motor subscore is 6.  Slightly diminished grip strength of left compared to right.  Distal sensation intact bilaterally.  No pronator drift.    Skin: Skin is warm and dry. No rash noted.  Psychiatric: She has a normal mood and affect.  Nursing note and vitals reviewed.    ED Treatments / Results  Labs (all labs ordered are listed, but only abnormal results are displayed) Labs Reviewed  BASIC METABOLIC PANEL - Abnormal; Notable for the following:       Result Value   Sodium 130 (*)    Chloride 98 (*)    Glucose, Bld 488 (*)    BUN 28 (*)    Creatinine, Ser 2.50 (*)    Calcium 8.5 (*)    GFR calc non Af Amer 22 (*)    GFR calc Af Amer 25 (*)    All other components within normal limits  CBC WITH DIFFERENTIAL/PLATELET - Abnormal; Notable for the following:    Hemoglobin 11.8 (*)    MCH 25.3 (*)    All other components within normal limits  TROPONIN I    EKG  EKG Interpretation  Date/Time:  Monday August 07 2016 08:14:49 EST Ventricular Rate:  68 PR Interval:    QRS Duration: 91 QT Interval:  394 QTC Calculation: 419 R Axis:   28 Text Interpretation:  Sinus rhythm Since last EKG on 06-01-2015: Baseline wander in inferior leads ST changes in inferior leads, likely artifactual due to baseline wander Otherwise no significant change Confirmed by ISAACS MD, Lysbeth Galas [phone]) on 08/08/2016 10:11:16 AM       Radiology No results found.  Procedures Procedures (including critical care time)  Medications Ordered in ED Medications  morphine 4 MG/ML injection 4 mg (4 mg Intravenous Given 08/07/16 0851)  ondansetron (ZOFRAN) injection 4 mg (4 mg Intravenous Given 08/07/16 0851)  sodium chloride 0.9 % bolus 1,000 mL (0 mLs Intravenous Stopped 08/07/16 1211)  morphine 4 MG/ML injection 4 mg (4 mg Intravenous Given 08/07/16 1111)     Initial Impression / Assessment  and Plan / ED Course  I have reviewed the triage vital signs and the nursing notes.  Pertinent labs & imaging results that were available during my care of the patient were reviewed by me and considered in my medical decision making (see chart for details).      Pt with pain of the left shoulder joint and left SCM muscles that is reproducible with ROM.  No facial weakness, dysarthria, motor or sensory deficits to suggest TIA/CVA.  Feeling better after IV medications.    Discussed with Dr. Lita Mains.    Pt with likely musculoskeletal pain without neurological or infectious process.  Appears stable for d/c.  Return precautions discussed. I will prescribe a short course of pain medication and muscle relaxer.      Final Clinical Impressions(s) / ED Diagnoses   Final diagnoses:  Acute pain of left shoulder  Strain of neck muscle, initial encounter  Chronic kidney disease, unspecified CKD stage    New Prescriptions Discharge Medication List as of 08/07/2016 12:32 PM    START taking these medications   Details  HYDROcodone-acetaminophen (NORCO/VICODIN) 5-325 MG tablet Take one-two tabs po q 4-6 hrs prn pain, Print    methocarbamol (ROBAXIN) 500 MG tablet Take 1 tablet (500 mg total) by mouth 3 (three) times daily., Starting Mon 08/07/2016, Print         Tiran Sauseda [place], PA-C 08/09/16 1847    Julianne Rice, MD 08/10/16 [phone]

## 2016-08-07 NOTE — ED Triage Notes (Signed)
Reports left shoulder pain into left side of neck x2 days.  Cannot raise arm over head or behind back.

## 2016-08-07 NOTE — ED Notes (Signed)
Patient transported to X-ray 

## 2016-08-21 ENCOUNTER — Telehealth: Payer: Self-pay | Admitting: Orthopaedic Surgery

## 2016-08-21 NOTE — Telephone Encounter (Signed)
Patient called to relay she was seen in [facility] Emergency room; states problem is "bone spur" - patient inquired about referral prior, which I relayed is needed, per her insurance requirement.  States will call primary care doctor to request.

## 2016-09-20 NOTE — Congregational Nurse Program (Signed)
Congregational Nurse Program Note  Date of Encounter: 09/20/2016  Past Medical History: Past Medical History:  Diagnosis Date  . Anemia   . Asystole ([place])    a. 05/2015: pt developed bradycardia->asystole during [facility] nuclear stress test, s/p brief code. Cath with only 30% prox LCx. Her asystole during Lexiscan infusion was felt to represent an excessive pharmacologic response to the agent and likely superimposed vasovagal response.  . CKD (chronic kidney disease) stage 3, GFR 30-59 ml/min   . COPD (chronic obstructive pulmonary disease) ([place])   . Depression   . Diabetes mellitus with diabetic retinopathy   . Essential hypertension   . History of blood transfusion   . History of stroke April 2016   Acute lacunar infarct of the left thalamus  . History of stroke   . Mild CAD    a. LHC 05/2015: 30% prox Cx, otherwise widely patent.  . Morbid obesity ([place])   . TIA (transient ischemic attack)     Encounter Details:     CNP Questionnaire - 09/20/16 1531      Patient Demographics   Is this a new or existing patient? Existing   Patient is considered a/an Not Applicable   Race African-American/Black     Patient Assistance   Location of Patient [facility]   Patient's financial/insurance status Medicaid;Self-Pay (Uninsured);Low Income   Uninsured Patient ([facility]) No   Patient referred to apply for the following financial assistance Not Applicable   Food insecurities addressed Not Applicable   Transportation assistance No   Assistance securing medications No   Educational health offerings Chronic disease;Navigating the healthcare system     Encounter Details   Primary purpose of visit Acute Illness/Condition Visit   Was an Emergency Department visit averted? Yes   Does patient have a medical provider? Yes   Patient referred to Follow up with established PCP   Was a mental health screening completed? (GAINS tool) No   Does patient have  dental issues? No   Does patient have vision issues? No   Does your patient have an abnormal blood pressure today? Yes   Since previous encounter, have you referred patient for abnormal blood pressure that resulted in a new diagnosis or medication change? Yes   Does your patient have an abnormal blood glucose today? No   Since previous encounter, have you referred patient for abnormal blood glucose that resulted in a new diagnosis or medication change? No   Was there a life-saving intervention made? No     client came in today with complaint of a 4 day painful, raised , itchy area on her right abdominal area from an insulin injection site. Area is about an inch wide in diameter, hard and warm to touch,, no drainage noted. Client states that it itches and that she has had abscessed areas in the past. Client is afebrile temp 98.4 orally Blood pressure is 170/89 pulse 76 Glucose non fasting , client ate 1 hour ago pizza without her sliding scale insulin 475. Client states she is going home to take insulins.  Client is a primary care patient of Dr Samantha Pierce at [facility]. She has an appointment on 09/29/16 . RN called [facility] at [facility] and scheduled an appointment for 09/22/16 at 0930 am. Attempted to call [facility] for transport , but the office is closed today as of 230 pm. Client states she will ask a friend and also call [facility] again. She states she knows the  policy for emergency transportation and will notify her primary care if she needs to .  RN encouraged client to take her medications when returning home for her diabetes , to keep area clean and dry, not to scratch the area and to avoid using that area for insulin injections until seen by her primary care provider on 09/22/16.  Client is to return to [facility] tomorrow for recheck of blood pressure as well as hardened area on abdomen. Client reports she takes her blood pressure medications all in the evening. Encouraged  client to discuss with her provider if she can take some of her meds in the morning and some in the evening to decrease her drowsiness as well as give her better control of her blood pressure. Client states she will come back to [facility] on Thursday 09/21/16 for recheck of site and blood pressure checks.  Client encouraged to come by at anytime for blood pressure checks or glucose check, but her primary medical provider will manage her medications and treatments. Client states understanding.  Will follow as needed for general blood pressure monitoring and any resource assistance .

## 2016-09-21 ENCOUNTER — Ambulatory Visit: Payer: Medicaid Other | Admitting: Cardiology

## 2016-09-25 ENCOUNTER — Telehealth: Payer: Self-pay

## 2016-09-25 NOTE — Telephone Encounter (Signed)
Called client to follow up from encounter with client last week at [facility]. Client had come in with complaint of hardened round area that was warm to touch on her right abdomen about the size of a fifty cent piece. RN had called to get her an appointment with her PCP Dr Marlou Sa at [facility] for 09/22/16.  Client states she did not make her appointment , but has one for this coming Friday and states the area is gone, that she kept it clean and dry and avoided injections of insulin in that area as RN instructed. She states she may come by later for a blood pressure check. Will follow as needed. Client aware that she can come into [facility] for blood pressure screenings and navigational assistance into her already established medical providers. She states understanding again that [facility] is not a primary care provider. Reminded client to continue to keep her appointments with her established medical providers and to follow medical providers instructions for her medical care and medications. Client affirms that she will continue to do so.

## 2016-10-10 NOTE — Progress Notes (Signed)
Cardiology Office Note  Date: 10/11/2016   ID: Samantha Pierce, DOB 10-15-1970, MRN [id number]  PCP: [facility]  Primary Cardiologist: Rozann Lesches, MD   Chief Complaint  Patient presents with  . Cardiac follow-up    History of Present Illness: Samantha Pierce is a 46 y.o. female last seen in April 2017. She presents for a routine follow-up visit. Over the last year she does not report any progressive chest pain symptoms, did have some recently, description sounds potentially like reflux or esophageal spasm. She has had no exertional chest pain or increasing shortness of breath. I reviewed her recent ECG which is outlined below.  She is volunteering at local homeless shelters, in fact recently got recognized as the [facility] of the month.  I reviewed her medications. Regimen includes Lipitor, Norvasc, Plavix, Lasix, lisinopril, losartan, and Toprol-XL. She is followed by PCP, systolic blood pressure in the 140s today. She states that her lipids have been better controlled compared to last testing. She is concerned about her weight, states that she was less active over the winter months but does plan to do some more walking for exercise.  Past Medical History:  Diagnosis Date  . Anemia   . Asystole ([place])    a. 05/2015: pt developed bradycardia->asystole during [place] nuclear stress test, s/p brief code. Cath with only 30% prox LCx. Her asystole during Lexiscan infusion was felt to represent an excessive pharmacologic response to the agent and likely superimposed vasovagal response.  . CKD (chronic kidney disease) stage 3, GFR 30-59 ml/min   . COPD (chronic obstructive pulmonary disease) ([place])   . Depression   . Diabetes mellitus with diabetic retinopathy   . Essential hypertension   . History of blood transfusion   . History of stroke April 2016   Acute lacunar infarct of the left thalamus  . History of stroke   . Mild CAD    a.  LHC 05/2015: 30% prox Cx, otherwise widely patent.  . Morbid obesity ([place])   . TIA (transient ischemic attack)     Past Surgical History:  Procedure Laterality Date  . CARDIAC CATHETERIZATION N/A 06/01/2015   Procedure: Left Heart Cath and Coronary Angiography;  Surgeon: Belva Crome, MD; CFX 30%, no other dz, EF nl by echo  . CESAREAN SECTION     x2  . DILATION AND CURETTAGE OF UTERUS    . EYE SURGERY     Right eye pars plano vitrectomy   . LASIK    . PARS PLANA VITRECTOMY  04/20/2011   Procedure: PARS PLANA VITRECTOMY WITH 25 GAUGE;  Surgeon: Hayden Pedro, MD;  Location: [place];  Service: Ophthalmology;  Laterality: Left;  membrane peel, gas fluid exchange, endolaser, repair of complex retinal detachment  . TUBAL LIGATION      Current Outpatient Prescriptions  Medication Sig Dispense Refill  . albuterol (PROVENTIL HFA;VENTOLIN HFA) 108 (90 BASE) MCG/ACT inhaler Inhale 1-2 puffs into the lungs every 6 (six) hours as needed for wheezing or shortness of breath. 1 Inhaler 0  . amLODipine (NORVASC) 10 MG tablet Take 10 mg by mouth daily.    Marland Kitchen atorvastatin (LIPITOR) 80 MG tablet Take 1 tablet (80 mg total) by mouth daily at 6 PM. 30 tablet 6  . cloNIDine (CATAPRES) 0.2 MG tablet Take 0.2 mg by mouth 2 (two) times daily.     . clopidogrel (PLAVIX) 75 MG tablet Take 1 tablet (75 mg total) by mouth daily.  30 tablet 0  . FLUoxetine (PROZAC) 10 MG capsule Take 15 mg by mouth daily.     . furosemide (LASIX) 40 MG tablet Take 40 mg by mouth daily.    Marland Kitchen gabapentin (NEURONTIN) 300 MG capsule Take 1 capsule (300 mg total) by mouth at bedtime as needed (For numbness and tingling).    Marland Kitchen HYDROcodone-acetaminophen (NORCO/VICODIN) 5-325 MG tablet Take one-two tabs po q 4-6 hrs prn pain 10 tablet 0  . insulin aspart (NOVOLOG) 100 UNIT/ML FlexPen Inject 0-18 Units into the skin 3 (three) times daily with meals.    . Insulin Detemir (LEVEMIR) 100 UNIT/ML Pen Inject 60 Units into the skin at bedtime.      Marland Kitchen lisinopril (PRINIVIL,ZESTRIL) 20 MG tablet Take 20 mg by mouth daily.    Marland Kitchen losartan (COZAAR) 25 MG tablet Take 25 mg by mouth daily.    . methocarbamol (ROBAXIN) 500 MG tablet Take 1 tablet (500 mg total) by mouth 3 (three) times daily. 21 tablet 0  . metoprolol succinate (TOPROL-XL) 25 MG 24 hr tablet Take 25 mg by mouth daily.    Marland Kitchen tiotropium (SPIRIVA) 18 MCG inhalation capsule Place 18 mcg into inhaler and inhale daily.    . traZODone (DESYREL) 100 MG tablet Take 150 mg by mouth at bedtime.      No current facility-administered medications for this visit.    Allergies:  Lexiscan [regadenoson] and Penicillins   Social History: The patient  reports that she quit smoking about 2 years ago. Her smoking use included Cigarettes. She started smoking about 35 years ago. She has a 0.90 pack-year smoking history. She has never used smokeless tobacco. She reports that she does not drink alcohol or use drugs.   ROS:  Please see the history of present illness. Otherwise, complete review of systems is positive for occasional reflux symptoms.  All other systems are reviewed and negative.   Physical Exam: VS:  BP 140/78   Pulse 60   Ht 5\' 4"  (1.626 m)   Wt 275 lb (124.7 kg)   SpO2 99%   BMI 47.20 kg/m , BMI Body mass index is 47.2 kg/m.  Wt Readings from Last 3 Encounters:  10/11/16 275 lb (124.7 kg)  09/20/16 281 lb (127.5 kg)  08/07/16 270 lb (122.5 kg)    General: Morbidly obese woman, no distress. HEENT: Conjunctiva and lids normal, oropharynx clear. Neck: Supple, no elevated JVP or carotid bruits, no thyromegaly. Lungs: Clear to auscultation, nonlabored breathing at rest. Cardiac: Regular rate and rhythm, no significant systolic murmur. Abdomen: Soft, obese, bowel sounds present. Extremities: No pitting edema, distal pulses 2+. Skin: Warm and dry. Musculoskeletal: No kyphosis. Neuropsychiatric: Alert and oriented x3, affect grossly appropriate.  ECG: I personally reviewed the  tracing from 08/07/2016 which showed sinus rhythm with borderline low voltage and nonspecific ST changes.  Recent Labwork: 08/07/2016: BUN 28; Creatinine, Ser 2.50; Hemoglobin 11.8; Platelets 308; Potassium 4.5; Sodium 130     Component Value Date/Time   CHOL 245 (H) 05/23/2015 0545   TRIG 278 (H) 05/23/2015 0545   HDL 45 05/23/2015 0545   CHOLHDL 5.4 05/23/2015 0545   VLDL 56 (H) 05/23/2015 0545   LDLCALC 144 (H) 05/23/2015 0545    Other Studies Reviewed Today:  Cardiac catheterization 06/01/2015:         Prox Cx lesion, 30% stenosed.   The coronary arteries are widely patent with minimal plaque noted. 40 cc of contrast was used  Left ventricular end-diastolic pressure is mildly elevated.  Left ventriculography was not performed in an attempt to minimize contrast exposure.  Asystole during adenosine infusion represents an excessive pharmacologic response to the agent and likely superimposed vasovagal response.  Echocardiogram 09/04/2014: Study Conclusions  - Left ventricle: The cavity size was at the upper limits of normal. Systolic function was normal. The estimated ejection fraction was in the range of 55% to 60%. Wall motion was normal; there were no regional wall motion abnormalities. Features are consistent with a pseudonormal left ventricular filling pattern, with concomitant abnormal relaxation and increased filling pressure (grade 2 diastolic dysfunction). Doppler parameters are consistent with high ventricular filling pressure. - Aortic valve: There was mild regurgitation. - Left atrium: The atrium was mildly dilated. Volume/bsa, ES, (1-plane Simpson&'s, A2C): 29.6 ml/m^2. - Atrial septum: No defect or patent foramen ovale was identified.  Assessment and Plan:  1. History of mild coronary atherosclerosis documented at cardiac catheterization in December 2016. She does not report any obvious angina symptoms and is on a reasonable medical regimen for  risk factor modification. She continues on antiplatelet therapy and statin. Recent ECG reviewed. Recommended diet and exercise.  2. Essential hypertension, systolic blood pressure in the 140s today. Recent medications reviewed. Keep follow-up with PCP.  3. Hyperlipidemia, reports tolerating high-dose Lipitor. Keep follow-up with PCP and would aim for LDL under 100, preferably closer to 70.  4. Obesity, discussed diet and weight loss, increasing regular activity.  Current medicines were reviewed with the patient today.  Disposition: Follow-up in one year.  Signed, Satira Sark, MD, [facility] 10/11/2016 8:23 AM    [facility]. [address] [place], [place] [address] Phone: [phone]; Fax: [phone]

## 2016-10-11 ENCOUNTER — Ambulatory Visit (INDEPENDENT_AMBULATORY_CARE_PROVIDER_SITE_OTHER): Payer: Medicaid Other | Admitting: Cardiology

## 2016-10-11 ENCOUNTER — Encounter: Payer: Self-pay | Admitting: Cardiology

## 2016-10-11 VITALS — BP 140/78 | HR 60 | Ht 64.0 in | Wt 275.0 lb

## 2016-10-11 DIAGNOSIS — I25810 Atherosclerosis of coronary artery bypass graft(s) without angina pectoris: Secondary | ICD-10-CM

## 2016-10-11 DIAGNOSIS — I10 Essential (primary) hypertension: Secondary | ICD-10-CM

## 2016-10-11 DIAGNOSIS — E782 Mixed hyperlipidemia: Secondary | ICD-10-CM

## 2016-10-11 NOTE — Patient Instructions (Signed)

## 2016-11-21 ENCOUNTER — Other Ambulatory Visit: Payer: Self-pay

## 2016-11-21 DIAGNOSIS — Z01810 Encounter for preprocedural cardiovascular examination: Secondary | ICD-10-CM

## 2016-11-21 DIAGNOSIS — N184 Chronic kidney disease, stage 4 (severe): Secondary | ICD-10-CM

## 2016-12-12 ENCOUNTER — Other Ambulatory Visit: Payer: Self-pay | Admitting: Neurosurgery

## 2016-12-12 DIAGNOSIS — M5412 Radiculopathy, cervical region: Secondary | ICD-10-CM

## 2016-12-13 ENCOUNTER — Encounter: Payer: Self-pay | Admitting: Vascular Surgery

## 2016-12-22 ENCOUNTER — Encounter: Payer: Self-pay | Admitting: Vascular Surgery

## 2016-12-22 ENCOUNTER — Ambulatory Visit (HOSPITAL_COMMUNITY)
Admission: RE | Admit: 2016-12-22 | Discharge: 2016-12-22 | Disposition: A | Discharge status: Home / Self Care | Payer: Medicaid Other | Source: Ambulatory Visit | Attending: Vascular Surgery | Admitting: Vascular Surgery

## 2016-12-22 ENCOUNTER — Ambulatory Visit (INDEPENDENT_AMBULATORY_CARE_PROVIDER_SITE_OTHER): Payer: Medicaid Other | Admitting: Vascular Surgery

## 2016-12-22 VITALS — BP 120/75 | HR 63 | Temp 97.1°F | Resp 16 | Ht 64.0 in | Wt 273.0 lb

## 2016-12-22 DIAGNOSIS — N184 Chronic kidney disease, stage 4 (severe): Secondary | ICD-10-CM

## 2016-12-22 DIAGNOSIS — Z01810 Encounter for preprocedural cardiovascular examination: Secondary | ICD-10-CM

## 2016-12-22 DIAGNOSIS — Z48812 Encounter for surgical aftercare following surgery on the circulatory system: Secondary | ICD-10-CM | POA: Diagnosis not present

## 2016-12-22 NOTE — Progress Notes (Signed)
Vitals:   12/22/16 1119  BP: (!) 149/85  Pulse: 63  Resp: 16  Temp: (!) 97.1 F (36.2 C)  SpO2: 100%  Weight: 273 lb (123.8 kg)  Height: 5\' 4"  (1.626 m)

## 2016-12-22 NOTE — Progress Notes (Signed)
Patient ID: Samantha Pierce, female   DOB: 1970-07-05, 46 y.o.   MRN: [id number]  Reason for Consult: New Evaluation (eval for access)   Referred by [name], MD  Subjective:     HPI:  Samantha Pierce is a 46 y.o. female with history of coronary artery disease and diabetes with CKD followed by Dr. Inez Catalina tolerated. She is left-hand dominant but does have a current issue that is possible cervical spine issue causing her weakness in her left upper extremity. She is referred for evaluation of placement of permanent dialysis access prior to requiring temporary access. She has never had dialysis before is never had upper extremity surgery. She does have a history of stroke that affected her right upper extremity but she has no residual weakness of this time.  Past Medical History:  Diagnosis Date  . Anemia   . Asystole ([place])    a. 05/2015: pt developed bradycardia->asystole during [place] nuclear stress test, s/p brief code. Cath with only 30% prox LCx. Her asystole during Lexiscan infusion was felt to represent an excessive pharmacologic response to the agent and likely superimposed vasovagal response.  . CKD (chronic kidney disease) stage 3, GFR 30-59 ml/min   . COPD (chronic obstructive pulmonary disease) ([place])   . Depression   . Diabetes mellitus with diabetic retinopathy   . Essential hypertension   . History of blood transfusion   . History of stroke April 2016   Acute lacunar infarct of the left thalamus  . History of stroke   . Mild CAD    a. LHC 05/2015: 30% prox Cx, otherwise widely patent.  . Morbid obesity ([place])   . TIA (transient ischemic attack)    Family History  Problem Relation Age of Onset  . Diabetes Mother    Past Surgical History:  Procedure Laterality Date  . CARDIAC CATHETERIZATION N/A 06/01/2015   Procedure: Left Heart Cath and Coronary Angiography;  Surgeon: Belva Crome, MD; CFX 30%, no other dz, EF nl by echo  . CESAREAN SECTION     x2  .  DILATION AND CURETTAGE OF UTERUS    . EYE SURGERY     Right eye pars plano vitrectomy   . LASIK    . PARS PLANA VITRECTOMY  04/20/2011   Procedure: PARS PLANA VITRECTOMY WITH 25 GAUGE;  Surgeon: Hayden Pedro, MD;  Location: [place];  Service: Ophthalmology;  Laterality: Left;  membrane peel, gas fluid exchange, endolaser, repair of complex retinal detachment  . TUBAL LIGATION      Short Social History:  Social History  Substance Use Topics  . Smoking status: Former Smoker    Packs/day: 0.03    Years: 30.00    Types: Cigarettes    Start date: 06/05/1981    Quit date: 04/04/2014  . Smokeless tobacco: Never Used  . Alcohol use No    Allergies  Allergen Reactions  . Lexiscan [Regadenoson]     Asystole after infusion during stress test.   . Penicillins Rash    Has patient had a PCN reaction causing immediate rash, facial/tongue/throat swelling, SOB or lightheadedness with hypotension: Yes Has patient had a PCN reaction causing severe rash involving mucus membranes or skin necrosis: No Has patient had a PCN reaction that required hospitalization No Has patient had a PCN reaction occurring within the last 10 years: No If all of the above answers are "NO", then may proceed with Cephalosporin use.     Current Outpatient Prescriptions  Medication Sig  Dispense Refill  . albuterol (PROVENTIL HFA;VENTOLIN HFA) 108 (90 BASE) MCG/ACT inhaler Inhale 1-2 puffs into the lungs every 6 (six) hours as needed for wheezing or shortness of breath. 1 Inhaler 0  . amLODipine (NORVASC) 10 MG tablet Take 10 mg by mouth daily.    Marland Kitchen atorvastatin (LIPITOR) 80 MG tablet Take 1 tablet (80 mg total) by mouth daily at 6 PM. 30 tablet 6  . cloNIDine (CATAPRES) 0.2 MG tablet Take 0.2 mg by mouth 2 (two) times daily.     . clopidogrel (PLAVIX) 75 MG tablet Take 1 tablet (75 mg total) by mouth daily. 30 tablet 0  . FLUoxetine (PROZAC) 10 MG capsule Take 15 mg by mouth daily.     . furosemide (LASIX) 40 MG tablet  Take 40 mg by mouth daily.    Marland Kitchen gabapentin (NEURONTIN) 300 MG capsule Take 1 capsule (300 mg total) by mouth at bedtime as needed (For numbness and tingling).    Marland Kitchen HYDROcodone-acetaminophen (NORCO/VICODIN) 5-325 MG tablet Take one-two tabs po q 4-6 hrs prn pain 10 tablet 0  . insulin aspart (NOVOLOG) 100 UNIT/ML FlexPen Inject 0-18 Units into the skin 3 (three) times daily with meals.    . Insulin Detemir (LEVEMIR) 100 UNIT/ML Pen Inject 60 Units into the skin at bedtime.    Marland Kitchen lisinopril (PRINIVIL,ZESTRIL) 20 MG tablet Take 20 mg by mouth daily.    Marland Kitchen losartan (COZAAR) 25 MG tablet Take 25 mg by mouth daily.    . methocarbamol (ROBAXIN) 500 MG tablet Take 1 tablet (500 mg total) by mouth 3 (three) times daily. 21 tablet 0  . metoprolol succinate (TOPROL-XL) 25 MG 24 hr tablet Take 25 mg by mouth daily.    Marland Kitchen tiotropium (SPIRIVA) 18 MCG inhalation capsule Place 18 mcg into inhaler and inhale daily.    . traZODone (DESYREL) 100 MG tablet Take 150 mg by mouth at bedtime.      No current facility-administered medications for this visit.     Review of Systems  Constitutional:  Constitutional negative. HENT: HENT negative.  Eyes: Positive for loss of vision.   Respiratory: Positive for shortness of breath.  Cardiovascular: Cardiovascular negative. Positive for dyspnea with exertion.  GI: Gastrointestinal negative.  Skin: Skin negative.  Neurological: Positive for focal weakness and numbness.  Hematologic: Hematologic/lymphatic negative.  Psychiatric: Psychiatric negative.        Objective:  Objective   Vitals:   12/22/16 1119 12/22/16 1125  BP: (!) 149/85 120/75  Pulse: 63 63  Resp: 16   Temp: (!) 97.1 F (36.2 C)   SpO2: 100%   Weight: 273 lb (123.8 kg)   Height: 5\' 4"  (1.626 m)    Body mass index is 46.86 kg/m.  Physical Exam  Constitutional: She is oriented to person, place, and time. She appears well-developed.  HENT:  Head: Normocephalic.  Eyes: Pupils are equal, round,  and reactive to light.  Neck: Normal range of motion.  Cardiovascular: Normal rate.   Pulses:      Radial pulses are 2+ on the right side, and 2+ on the left side.       Dorsalis pedis pulses are 2+ on the right side, and 2+ on the left side.  Abdominal: Soft.  Musculoskeletal: Normal range of motion.  Neurological: She is alert and oriented to person, place, and time.  Skin: Skin is warm and dry.  Psychiatric: She has a normal mood and affect. Her behavior is normal. Judgment and thought content normal.  Data: I've infinitely interpreted her bilateral upper extremity arterial duplex and vein mapping studies. She does appear to have adequate vein for upper arm fistula on the right to possibly have a basilic vein fistula in the left upper arm. She has triphasic waveforms in her bilateral brachial arteries which are nearly 0.5 cm in size.     Assessment/Plan:     46 year old female presents for evaluation of initial dialysis access placement. She's never had dialysis access for but does have multiple family members who have and she does have limited understanding of this. We discussed the options for dialysis which included catheter, graft, fistula with fistula being the preferred method in the nondominant upper extremity to start with. We Reviewed Her Arterial and Venous Imaging from Today and we will start with placement of a right upper extremity AV fistula. She understands that being a diabetic female she will have some risk of this nonfunctioning without further procedures and possibly never functioning. Should she require dialysis before this is rated for use she will need a catheter. We discussed the risks of steal and nerve injury including IMN and she demonstrates good understanding. We will get her scheduled today for right upper extremity AV fistula.     Waynetta Sandy MD [facility]

## 2016-12-27 ENCOUNTER — Ambulatory Visit
Admission: RE | Admit: 2016-12-27 | Discharge: 2016-12-27 | Disposition: A | Discharge status: Home / Self Care | Payer: Medicaid Other | Source: Ambulatory Visit | Attending: Neurosurgery | Admitting: Neurosurgery

## 2016-12-27 DIAGNOSIS — M5412 Radiculopathy, cervical region: Secondary | ICD-10-CM

## 2017-01-02 ENCOUNTER — Other Ambulatory Visit: Payer: Self-pay

## 2017-01-04 ENCOUNTER — Encounter (HOSPITAL_COMMUNITY): Payer: Self-pay | Admitting: *Deleted

## 2017-01-04 NOTE — Progress Notes (Signed)
Spoke with pt for pre-op call. Pt denies cardiac history or chest pain. She has had a heart catheterization and it showed mild CAD in 2016. Pt is a type 2 diabetic. She states her most recent A1C was 10.0 three months ago. States her fasting blood sugar usually is between 145-180. Pt instructed to take 1/2 of her regular dose of Levemir Insulin Sunday PM. Instructed pt to check blood sugar Monday AM when she gets up and every 2 hours until she leaves for the hospital. If blood sugar is >220 take 1/2 of usual correction dose of Novolog insulin. If blood sugar is 70 or below, treat with 1/2 cup of clear juice (apple or cranberry) and recheck blood sugar 15 minutes after drinking juice. If blood sugar continues to be 70 or below, call the Short Stay department and ask to speak to a nurse. Pt voiced understanding.

## 2017-01-08 ENCOUNTER — Ambulatory Visit (HOSPITAL_COMMUNITY): Payer: Medicaid Other | Admitting: Anesthesiology

## 2017-01-08 ENCOUNTER — Encounter (HOSPITAL_COMMUNITY): Payer: Self-pay | Admitting: Certified Registered Nurse Anesthetist

## 2017-01-08 ENCOUNTER — Encounter (HOSPITAL_COMMUNITY)
Admission: RE | Disposition: A | Discharge status: Home / Self Care | Payer: Self-pay | Source: Ambulatory Visit | Attending: Vascular Surgery

## 2017-01-08 ENCOUNTER — Ambulatory Visit (HOSPITAL_COMMUNITY)
Admission: RE | Admit: 2017-01-08 | Discharge: 2017-01-08 | Disposition: A | Discharge status: Home / Self Care | Payer: Medicaid Other | Source: Ambulatory Visit | Attending: Vascular Surgery | Admitting: Vascular Surgery

## 2017-01-08 DIAGNOSIS — Z79899 Other long term (current) drug therapy: Secondary | ICD-10-CM | POA: Diagnosis not present

## 2017-01-08 DIAGNOSIS — Z8673 Personal history of transient ischemic attack (TIA), and cerebral infarction without residual deficits: Secondary | ICD-10-CM | POA: Insufficient documentation

## 2017-01-08 DIAGNOSIS — Z87891 Personal history of nicotine dependence: Secondary | ICD-10-CM | POA: Insufficient documentation

## 2017-01-08 DIAGNOSIS — E1122 Type 2 diabetes mellitus with diabetic chronic kidney disease: Secondary | ICD-10-CM | POA: Diagnosis not present

## 2017-01-08 DIAGNOSIS — Z6841 Body Mass Index (BMI) 40.0 and over, adult: Secondary | ICD-10-CM | POA: Diagnosis not present

## 2017-01-08 DIAGNOSIS — F329 Major depressive disorder, single episode, unspecified: Secondary | ICD-10-CM | POA: Insufficient documentation

## 2017-01-08 DIAGNOSIS — Z794 Long term (current) use of insulin: Secondary | ICD-10-CM | POA: Diagnosis not present

## 2017-01-08 DIAGNOSIS — N183 Chronic kidney disease, stage 3 (moderate): Secondary | ICD-10-CM | POA: Diagnosis not present

## 2017-01-08 DIAGNOSIS — I129 Hypertensive chronic kidney disease with stage 1 through stage 4 chronic kidney disease, or unspecified chronic kidney disease: Secondary | ICD-10-CM | POA: Insufficient documentation

## 2017-01-08 DIAGNOSIS — N185 Chronic kidney disease, stage 5: Secondary | ICD-10-CM | POA: Diagnosis not present

## 2017-01-08 DIAGNOSIS — J449 Chronic obstructive pulmonary disease, unspecified: Secondary | ICD-10-CM | POA: Insufficient documentation

## 2017-01-08 DIAGNOSIS — F419 Anxiety disorder, unspecified: Secondary | ICD-10-CM | POA: Diagnosis not present

## 2017-01-08 HISTORY — DX: Cramp and spasm: R25.2

## 2017-01-08 HISTORY — DX: Unspecified convulsions: R56.9

## 2017-01-08 HISTORY — DX: Gastro-esophageal reflux disease without esophagitis: K21.9

## 2017-01-08 HISTORY — PX: AV FISTULA PLACEMENT: SHX1204

## 2017-01-08 HISTORY — DX: Unspecified osteoarthritis, unspecified site: M19.90

## 2017-01-08 HISTORY — DX: Polyneuropathy, unspecified: G62.9

## 2017-01-08 HISTORY — DX: Dyspnea, unspecified: R06.00

## 2017-01-08 HISTORY — DX: Post-traumatic stress disorder, unspecified: F43.10

## 2017-01-08 LAB — POCT I-STAT 4, (NA,K, GLUC, HGB,HCT)
Glucose, Bld: 69 mg/dL (ref 65–99)
HCT: 37 % (ref 36.0–46.0)
Hemoglobin: 12.6 g/dL (ref 12.0–15.0)
Potassium: 4.7 mmol/L (ref 3.5–5.1)
Sodium: 138 mmol/L (ref 135–145)

## 2017-01-08 LAB — GLUCOSE, CAPILLARY: Glucose-Capillary: 94 mg/dL (ref 65–99)

## 2017-01-08 LAB — I-STAT BETA HCG BLOOD, ED (NOT ORDERABLE): I-stat hCG, quantitative: <5 m[IU]/mL (ref <5)

## 2017-01-08 LAB — HCG, SERUM, QUALITATIVE: Preg, Serum: NEGATIVE

## 2017-01-08 SURGERY — ARTERIOVENOUS (AV) FISTULA CREATION
Anesthesia: General | Site: Arm Upper | Laterality: Right

## 2017-01-08 MED ORDER — MIDAZOLAM HCL 2 MG/2ML IJ SOLN
INTRAMUSCULAR | Status: AC
  Filled 2017-01-08: qty 2

## 2017-01-08 MED ORDER — SODIUM CHLORIDE 0.9 % IV SOLN
INTRAVENOUS | Status: DC | PRN
  Administered 2017-01-08: 500 mL

## 2017-01-08 MED ORDER — DEXAMETHASONE SODIUM PHOSPHATE 4 MG/ML IJ SOLN
INTRAMUSCULAR | Status: DC | PRN
  Administered 2017-01-08: 10 mg via INTRAVENOUS

## 2017-01-08 MED ORDER — EPHEDRINE SULFATE 50 MG/ML IJ SOLN
INTRAMUSCULAR | Status: DC | PRN
  Administered 2017-01-08 (×2): 10 mg via INTRAVENOUS
  Administered 2017-01-08: 5 mg via INTRAVENOUS
  Administered 2017-01-08: 10 mg via INTRAVENOUS

## 2017-01-08 MED ORDER — 0.9 % SODIUM CHLORIDE (POUR BTL) OPTIME
TOPICAL | Status: DC | PRN
  Administered 2017-01-08: 1000 mL

## 2017-01-08 MED ORDER — VANCOMYCIN HCL 10 G IV SOLR
1500.0000 mg | INTRAVENOUS | Status: AC
  Administered 2017-01-08: 1500 mg via INTRAVENOUS
  Filled 2017-01-08: qty 1500

## 2017-01-08 MED ORDER — FENTANYL CITRATE (PF) 250 MCG/5ML IJ SOLN
INTRAMUSCULAR | Status: AC
  Filled 2017-01-08: qty 5

## 2017-01-08 MED ORDER — LIDOCAINE-EPINEPHRINE (PF) 1 %-1:200000 IJ SOLN
INTRAMUSCULAR | Status: AC
  Filled 2017-01-08: qty 30

## 2017-01-08 MED ORDER — ONDANSETRON HCL 4 MG/2ML IJ SOLN
INTRAMUSCULAR | Status: DC | PRN
  Administered 2017-01-08: 4 mg via INTRAVENOUS

## 2017-01-08 MED ORDER — SODIUM CHLORIDE 0.9 % IV SOLN
INTRAVENOUS | Status: DC
  Administered 2017-01-08: 11:00:00 via INTRAVENOUS

## 2017-01-08 MED ORDER — GLYCOPYRROLATE 0.2 MG/ML IJ SOLN
INTRAMUSCULAR | Status: DC | PRN
  Administered 2017-01-08: 0.2 mg via INTRAVENOUS

## 2017-01-08 MED ORDER — FENTANYL CITRATE (PF) 100 MCG/2ML IJ SOLN
INTRAMUSCULAR | Status: DC | PRN
  Administered 2017-01-08: 100 ug via INTRAVENOUS
  Administered 2017-01-08: 50 ug via INTRAVENOUS
  Administered 2017-01-08: 100 ug via INTRAVENOUS

## 2017-01-08 MED ORDER — PROPOFOL 10 MG/ML IV BOLUS
INTRAVENOUS | Status: DC | PRN
  Administered 2017-01-08: 160 mg via INTRAVENOUS

## 2017-01-08 MED ORDER — LIDOCAINE HCL (CARDIAC) 20 MG/ML IV SOLN
INTRAVENOUS | Status: DC | PRN
  Administered 2017-01-08: 60 mg via INTRAVENOUS

## 2017-01-08 MED ORDER — MIDAZOLAM HCL 5 MG/5ML IJ SOLN
INTRAMUSCULAR | Status: DC | PRN
  Administered 2017-01-08: 2 mg via INTRAVENOUS

## 2017-01-08 MED ORDER — CHLORHEXIDINE GLUCONATE CLOTH 2 % EX PADS: 6.0000 | MEDICATED_PAD | Freq: Once | CUTANEOUS | Status: DC

## 2017-01-08 MED ORDER — PHENYLEPHRINE HCL 10 MG/ML IJ SOLN
INTRAMUSCULAR | Status: DC | PRN
  Administered 2017-01-08 (×4): 80 ug via INTRAVENOUS

## 2017-01-08 MED ORDER — SUCCINYLCHOLINE CHLORIDE 20 MG/ML IJ SOLN
INTRAMUSCULAR | Status: DC | PRN
  Administered 2017-01-08: 140 mg via INTRAVENOUS

## 2017-01-08 MED ORDER — OXYCODONE-ACETAMINOPHEN 5-325 MG PO TABS: 1.0000 | ORAL_TABLET | ORAL | 0 refills | Status: DC | PRN

## 2017-01-08 SURGICAL SUPPLY — 30 items
ARMBAND PINK RESTRICT EXTREMIT (MISCELLANEOUS) ×2 IMPLANT
CANISTER SUCT 3000ML PPV (MISCELLANEOUS) ×2 IMPLANT
CLIP VESOCCLUDE MED 6/CT (CLIP) ×2 IMPLANT
CLIP VESOCCLUDE SM WIDE 6/CT (CLIP) ×4 IMPLANT
COVER PROBE W GEL 5X96 (DRAPES) ×2 IMPLANT
DERMABOND ADVANCED (GAUZE/BANDAGES/DRESSINGS) ×1
DERMABOND ADVANCED .7 DNX12 (GAUZE/BANDAGES/DRESSINGS) ×1 IMPLANT
ELECT REM PT RETURN 9FT ADLT (ELECTROSURGICAL) ×2
ELECTRODE REM PT RTRN 9FT ADLT (ELECTROSURGICAL) ×1 IMPLANT
GLOVE BIO SURGEON STRL SZ7.5 (GLOVE) ×2 IMPLANT
GLOVE BIOGEL PI IND STRL 6.5 (GLOVE) ×1 IMPLANT
GLOVE BIOGEL PI IND STRL 7.0 (GLOVE) ×1 IMPLANT
GLOVE BIOGEL PI INDICATOR 6.5 (GLOVE) ×1
GLOVE BIOGEL PI INDICATOR 7.0 (GLOVE) ×1
GLOVE SURG SS PI 6.5 STRL IVOR (GLOVE) ×2 IMPLANT
GOWN STRL REUS W/ TWL LRG LVL3 (GOWN DISPOSABLE) ×2 IMPLANT
GOWN STRL REUS W/ TWL XL LVL3 (GOWN DISPOSABLE) ×1 IMPLANT
GOWN STRL REUS W/TWL LRG LVL3 (GOWN DISPOSABLE) ×2
GOWN STRL REUS W/TWL XL LVL3 (GOWN DISPOSABLE) ×1
KIT BASIN OR (CUSTOM PROCEDURE TRAY) ×2 IMPLANT
KIT ROOM TURNOVER OR (KITS) ×2 IMPLANT
NS IRRIG 1000ML POUR BTL (IV SOLUTION) ×2 IMPLANT
PACK CV ACCESS (CUSTOM PROCEDURE TRAY) ×2 IMPLANT
PAD ARMBOARD 7.5X6 YLW CONV (MISCELLANEOUS) ×4 IMPLANT
SUT MNCRL AB 4-0 PS2 18 (SUTURE) ×2 IMPLANT
SUT PROLENE 6 0 BV (SUTURE) ×4 IMPLANT
SUT VIC AB 3-0 SH 27 (SUTURE) ×1
SUT VIC AB 3-0 SH 27X BRD (SUTURE) ×1 IMPLANT
UNDERPAD 30X30 (UNDERPADS AND DIAPERS) ×2 IMPLANT
WATER STERILE IRR 1000ML POUR (IV SOLUTION) ×2 IMPLANT

## 2017-01-08 NOTE — Anesthesia Procedure Notes (Signed)
Procedure Name: Intubation Date/Time: 01/08/2017 11:05 AM Performed by: Ollen Bowl Pre-anesthesia Checklist: Patient identified, Emergency Drugs available, Suction available, Patient being monitored and Timeout performed Patient Re-evaluated:Patient Re-evaluated prior to induction Oxygen Delivery Method: Circle system utilized Preoxygenation: Pre-oxygenation with 100% oxygen Induction Type: IV induction Ventilation: Mask ventilation without difficulty and Oral airway inserted - appropriate to patient size Laryngoscope Size: Mac and 3 Grade View: Grade II Tube type: Oral Tube size: 7.5 mm Number of attempts: 2 Airway Equipment and Method: Patient positioned with wedge pillow and Stylet Placement Confirmation: ETT inserted through vocal cords under direct vision,  positive ETCO2 and breath sounds checked- equal and bilateral Secured at: 21 cm Tube secured with: Tape Dental Injury: Teeth and Oropharynx as per pre-operative assessment

## 2017-01-08 NOTE — Anesthesia Postprocedure Evaluation (Signed)
Anesthesia Post Note  Patient: Samantha Pierce  Procedure(s) Performed: Procedure(s) (LRB): RIGHT ARM BRACHIOCEPHALIC ARTERIOVENOUS (AV) FISTULA CREATION (Right)     Patient location during evaluation: PACU Anesthesia Type: General Level of consciousness: awake and alert Pain management: pain level controlled Vital Signs Assessment: post-procedure vital signs reviewed and stable Respiratory status: spontaneous breathing, nonlabored ventilation, respiratory function stable and patient connected to nasal cannula oxygen Cardiovascular status: blood pressure returned to baseline and stable Postop Assessment: no signs of nausea or vomiting Anesthetic complications: no    Last Vitals:  Vitals:   01/08/17 1315 01/08/17 1327  BP:  (!) 150/71  Pulse: 86 87  Resp: 20 18  Temp:  36.4 C    Last Pain:  Vitals:   01/08/17 1300  PainSc: 0-No pain                 Tiajuana Amass

## 2017-01-08 NOTE — H&P (Signed)
   History and Physical Update  The patient was interviewed and re-examined.  The patient's previous History and Physical has been reviewed and is unchanged from recent office visit. Plan for right arm avf.   Zareth Rippetoe C. Donzetta Matters, MD [facility] [place] Office: [phone] Pager: [phone]   01/08/2017, 10:09 AM

## 2017-01-08 NOTE — Op Note (Signed)
    Patient name: Samantha Pierce MRN: [id number] DOB: 1970/09/06 Sex: female  01/08/2017 Pre-operative Diagnosis: chronic kidney disease Post-operative diagnosis:  Same Surgeon:  Eda Paschal. Donzetta Matters, MD Assistant: OR nursing Procedure Performed: Right brachiocephalic av fistula  Indications:  46yo female with chronic kidney disease and low GFR now indicated for right upper extremity AV access for which she presents today. We have discussed risks benefits alternatives and she agrees to proceed.  Findings: Large caliber cephalic vein at the antecubitum 5 mm external diameter easily dilated to 3.5 mm internally. Brachial artery similarly had a large external diameter 5 mm at the antecubitum. At completion there was a palpable thrill in runoff vein as well as palpable radial pulse at the wrist confirmed by Doppler.   Procedure:  The patient was identified in the holding area and taken to the operating room where she was placed supine on the operating table general anesthesia was induced she was sterilely prepped and draped in the right arm in the usual fashion and a timeout called. Antibiotics were administered prior to incision. Ultrasound was used to palpate a brachial artery and a transverse incision was made overlying it. Dissected down and dissected out our cephalic vein first marked it for orientation. We then dissected out our brachial artery deep to the fascia placed vessel loop around it. We then transected our vein distally and it the distal aspect. He was dilated up to 3.5 mm and flushed with heparinized saline and reclamped. Then clamped our artery distally and proximally longitudinally and flushed with heparinized saline in both directions. The vein was then spatulated and sewn end-to-side with 6-0 Prolene suture. Prior to completing anastomosis we allowed flushing maneuvers. On completion was pulsatile flow in the vein graft. This was dissected out further to allow less tension at which time  there was a palpable thrill. This was confirmed with Doppler. There was also palpable pulse the radial artery at the wrist which was also confirmed with Doppler. Satisfied we obtained hemostasis of the wound closed in 2 layers of Vicryl Monocryl suture. Dermabond placed level skin. Patient tolerated procedure well without immediate complication. All counts were correct at completion.    Milderd Manocchio C. Donzetta Matters, MD [facility] Office: [phone] Pager: [phone]

## 2017-01-08 NOTE — Anesthesia Preprocedure Evaluation (Addendum)
Anesthesia Evaluation  Patient identified by MRN, date of birth, ID band Patient awake    Reviewed: Allergy & Precautions, NPO status , Patient's Chart, lab work & pertinent test results, reviewed documented beta blocker date and time   Airway Mallampati: II  TM Distance: >3 FB Neck ROM: Full    Dental  (+) Dental Advisory Given   Pulmonary shortness of breath, COPD, former smoker,    breath sounds clear to auscultation       Cardiovascular hypertension, Pt. on medications and Pt. on home beta blockers + CAD   Rhythm:Regular Rate:Normal     Neuro/Psych Seizures -,  Anxiety Depression TIACVA    GI/Hepatic Neg liver ROS, GERD  ,  Endo/Other  diabetesMorbid obesity  Renal/GU CRFRenal disease     Musculoskeletal  (+) Arthritis ,   Abdominal   Peds  Hematology  (+) anemia ,   Anesthesia Other Findings   Reproductive/Obstetrics                            Lab Results  Component Value Date   WBC 9.0 08/07/2016   HGB 11.8 (L) 08/07/2016   HCT 37.0 08/07/2016   MCV 79.4 08/07/2016   PLT 308 08/07/2016   Lab Results  Component Value Date   CREATININE 2.50 (H) 08/07/2016   BUN 28 (H) 08/07/2016   NA 130 (L) 08/07/2016   K 4.5 08/07/2016   CL 98 (L) 08/07/2016   CO2 25 08/07/2016    Anesthesia Physical Anesthesia Plan  ASA: III  Anesthesia Plan: General   Post-op Pain Management:    Induction: Intravenous  PONV Risk Score and Plan: 3 and Ondansetron, Dexamethasone and Treatment may vary due to age or medical condition  Airway Management Planned: Oral ETT  Additional Equipment:   Intra-op Plan:   Post-operative Plan: Extubation in OR  Informed Consent: I have reviewed the patients History and Physical, chart, labs and discussed the procedure including the risks, benefits and alternatives for the proposed anesthesia with the patient or authorized representative who has  indicated his/her understanding and acceptance.   Dental advisory given  Plan Discussed with: CRNA  Anesthesia Plan Comments:        Anesthesia Quick Evaluation

## 2017-01-08 NOTE — Transfer of Care (Signed)
Immediate Anesthesia Transfer of Care Note  Patient: Samantha Pierce  Procedure(s) Performed: Procedure(s): RIGHT ARM BRACHIOCEPHALIC ARTERIOVENOUS (AV) FISTULA CREATION (Right)  Patient Location: PACU  Anesthesia Type:General  Level of Consciousness: awake and alert   Airway & Oxygen Therapy: Patient Spontanous Breathing and Patient connected to face mask oxygen  Post-op Assessment: Report given to RN and Post -op Vital signs reviewed and stable  Post vital signs: Reviewed and stable  Last Vitals:  Vitals:   01/08/17 0939 01/08/17 1224  BP: (!) 107/49 (!) 121/59  Pulse: (!) 54 84  Resp: 18 19  Temp:  37 C    Last Pain: There were no vitals filed for this visit.    Patients Stated Pain Goal: 1 (47/20/72 1828)  Complications: No apparent anesthesia complications

## 2017-01-09 ENCOUNTER — Telehealth: Payer: Self-pay | Admitting: Vascular Surgery

## 2017-01-09 ENCOUNTER — Encounter (HOSPITAL_COMMUNITY): Payer: Self-pay | Admitting: Vascular Surgery

## 2017-01-09 NOTE — Telephone Encounter (Signed)
-----   Message from Mena Goes, RN sent at 01/08/2017 12:34 PM EDT ----- Regarding: 6 weeks w/ duplex   ----- Message ----- From: Waynetta Sandy, MD Sent: 01/08/2017  12:04 PM To: [address]  Samantha Pierce [id number] [birth year]  01/08/2017 Pre-operative Diagnosis: chronic kidney disease  Surgeon:  Eda Paschal. Donzetta Matters, MD Assistant: OR nursing  Procedure Performed: Right brachiocephalic av fistula   F/u in 6 weeks with dialysis duplex

## 2017-01-09 NOTE — Telephone Encounter (Signed)
Sched appt 03/02/17; lab at 11:00 and MD at 12:00. Lm on cell#.

## 2017-02-16 NOTE — Addendum Note (Signed)
Addended by: Lianne Cure A on: 02/16/2017 11:15 AM   Modules accepted: Orders

## 2017-03-02 ENCOUNTER — Encounter: Payer: Self-pay | Admitting: Vascular Surgery

## 2017-03-02 ENCOUNTER — Ambulatory Visit (HOSPITAL_COMMUNITY)
Admission: RE | Admit: 2017-03-02 | Discharge: 2017-03-02 | Disposition: A | Discharge status: Home / Self Care | Payer: Medicaid Other | Source: Ambulatory Visit | Attending: Vascular Surgery | Admitting: Vascular Surgery

## 2017-03-02 ENCOUNTER — Ambulatory Visit (INDEPENDENT_AMBULATORY_CARE_PROVIDER_SITE_OTHER): Payer: Self-pay | Admitting: Vascular Surgery

## 2017-03-02 VITALS — BP 221/103 | HR 80 | Temp 98.7°F | Resp 20 | Ht 64.0 in | Wt 282.0 lb

## 2017-03-02 DIAGNOSIS — N184 Chronic kidney disease, stage 4 (severe): Secondary | ICD-10-CM

## 2017-03-02 DIAGNOSIS — Z48812 Encounter for surgical aftercare following surgery on the circulatory system: Secondary | ICD-10-CM | POA: Insufficient documentation

## 2017-03-02 NOTE — Progress Notes (Signed)
Subjective:     Patient ID: Samantha Pierce, female   DOB: June 14, 1970, 46 y.o.   MRN: [id number]  HPI 31 she'll female follows up after placement of right upper arm AV fistula. She is not having any issues with her right hand and her wounds are healed well. She is not on dialysis at this time.   Review of Systems No complaints today    Objective:   Physical Exam aaox3 Non labored respirations Right upper arm with palpable thrill 2+ right radial pulse Grip strength 5/5 on right  Dialysis duplex-flow volume 1743 mL/m with adequate diameters but Is up to 2 cm in the proximal upper arm.    Assessment/plan     46yo female follows up from placement right upper extremity AV fistula. There is a narrowing in the mid upper arm that would likely benefit from fistulogram balloon angioplasty but she is not on dialysis at this time and does have an allergy to contrast media. When she is requiring dialysis and it is possible that she will need fistulogram with intervention and possibly even superficialization of the fistula given its depth. It does have very strong flow volumes of 1743 mL/m from the standpoint is adequate possibly could be used by the time it is needed. She can follow-up on a when necessary basis.  Estes Lehner C. Donzetta Matters, MD [facility] Office: [phone] Pager: [phone]

## 2017-03-30 ENCOUNTER — Telehealth: Payer: Self-pay | Admitting: Vascular Surgery

## 2017-03-30 NOTE — Telephone Encounter (Signed)
Pt called having symptoms on 03/29/17, spoke to triage, Samantha Pierce. We offered them an appt on 03/30/17 at 11:45. They said they needed to find transportation to the office and would call back before 3:00 pm that day. They did not call back. Pt did not come on 03/30/17 at 11:45. Pt's cell # is set not to receive calls, pt's hm# is disconnected.

## 2017-10-08 NOTE — Progress Notes (Deleted)
Cardiology Office Note  Date: 10/08/2017   ID: Samantha Pierce, DOB July 12, 1970, MRN [id number]  PCP: [facility], [facility]  Primary Cardiologist: Rozann Lesches, MD   No chief complaint on file.   History of Present Illness: Samantha Pierce is a 47 y.o. female last seen in May 2018.  Past Medical History:  Diagnosis Date  . Anemia   . Anxiety   . Arthritis    left arm  . Asystole ([place])    a. 05/2015: pt developed bradycardia->asystole during [place] nuclear stress test, s/p brief code. Cath with only 30% prox LCx. Her asystole during Lexiscan infusion was felt to represent an excessive pharmacologic response to the agent and likely superimposed vasovagal response.  . CKD (chronic kidney disease) stage 3, GFR 30-59 ml/min    stage 4, not on dialysis  . COPD (chronic obstructive pulmonary disease) ([place])   . Depression   . Diabetes mellitus with diabetic retinopathy    type 2  . Dyspnea   . Essential hypertension   . GERD (gastroesophageal reflux disease)   . History of blood transfusion   . History of stroke April 2016   Acute lacunar infarct of the left thalamus  . History of stroke   . Leg cramps   . Mild CAD    a. LHC 05/2015: 30% prox Cx, otherwise widely patent.  . Morbid obesity ([place])   . Neuropathy    both arms, feet  . PTSD (post-traumatic stress disorder)   . Seizures ([place])    has had one 12/26/15 - due to high blood sugar  . Stroke [facility])    right arm weakness, decrease feeling in fingers right hand  . TIA (transient ischemic attack)     Past Surgical History:  Procedure Laterality Date  . AV FISTULA PLACEMENT Right 01/08/2017   Procedure: RIGHT ARM BRACHIOCEPHALIC ARTERIOVENOUS (AV) FISTULA CREATION;  Surgeon: Waynetta Sandy, MD;  Location: [place];  Service: Vascular;  Laterality: Right;  . CARDIAC CATHETERIZATION N/A 06/01/2015   Procedure: Left Heart Cath and Coronary Angiography;  Surgeon: Belva Crome, MD; CFX 30%, no  other dz, EF nl by echo  . CESAREAN SECTION     x2  . DILATION AND CURETTAGE OF UTERUS    . EYE SURGERY     Right eye pars plano vitrectomy   . LASIK    . PARS PLANA VITRECTOMY  04/20/2011   Procedure: PARS PLANA VITRECTOMY WITH 25 GAUGE;  Surgeon: Hayden Pedro, MD;  Location: [place];  Service: Ophthalmology;  Laterality: Left;  membrane peel, gas fluid exchange, endolaser, repair of complex retinal detachment  . TUBAL LIGATION      Current Outpatient Medications  Medication Sig Dispense Refill  . albuterol (PROVENTIL HFA;VENTOLIN HFA) 108 (90 BASE) MCG/ACT inhaler Inhale 1-2 puffs into the lungs every 6 (six) hours as needed for wheezing or shortness of breath. 1 Inhaler 0  . amLODipine (NORVASC) 10 MG tablet Take 10 mg by mouth daily.    Marland Kitchen atorvastatin (LIPITOR) 80 MG tablet Take 1 tablet (80 mg total) by mouth daily at 6 PM. (Patient taking differently: Take 80 mg by mouth daily. ) 30 tablet 6  . brimonidine-timolol (COMBIGAN) 0.2-0.5 % ophthalmic solution Place 1 drop into both eyes at bedtime.    . cloNIDine (CATAPRES) 0.2 MG tablet Take 0.2 mg by mouth 2 (two) times daily.     . clopidogrel (PLAVIX) 75 MG tablet Take 1 tablet (75 mg total)  by mouth daily. 30 tablet 0  . FLUoxetine (PROZAC) 20 MG capsule Take 20 mg by mouth daily.    . furosemide (LASIX) 40 MG tablet Take 40 mg by mouth daily.    Marland Kitchen gabapentin (NEURONTIN) 300 MG capsule Take 1 capsule (300 mg total) by mouth at bedtime as needed (For numbness and tingling). (Patient taking differently: Take 300 mg by mouth daily. )    . HYDROcodone-acetaminophen (NORCO/VICODIN) 5-325 MG tablet Take one-two tabs po q 4-6 hrs prn pain 10 tablet 0  . insulin aspart (NOVOLOG) 100 UNIT/ML FlexPen Inject 0-20 Units into the skin 3 (three) times daily with meals. Sliding scale    . Insulin Detemir (LEVEMIR) 100 UNIT/ML Pen Inject 60 Units into the skin at bedtime. (Patient taking differently: Inject 80 Units into the skin at bedtime. )    .  lisinopril (PRINIVIL,ZESTRIL) 20 MG tablet Take 20 mg by mouth daily.    Marland Kitchen losartan (COZAAR) 25 MG tablet Take 25 mg by mouth daily.    . methocarbamol (ROBAXIN) 500 MG tablet Take 1 tablet (500 mg total) by mouth 3 (three) times daily. 21 tablet 0  . metoprolol succinate (TOPROL-XL) 25 MG 24 hr tablet Take 25 mg by mouth daily.    Marland Kitchen oxyCODONE-acetaminophen (ROXICET) 5-325 MG tablet Take 1 tablet by mouth every 4 (four) hours as needed for severe pain. 6 tablet 0  . tiotropium (SPIRIVA) 18 MCG inhalation capsule Place 18 mcg into inhaler and inhale daily.    . traZODone (DESYREL) 100 MG tablet Take 150 mg by mouth at bedtime.      No current facility-administered medications for this visit.    Allergies:  Contrast media [iodinated diagnostic agents] and Penicillins   Social History: The patient  reports that she quit smoking about 9 months ago. Her smoking use included cigarettes. She started smoking about 36 years ago. She has a 0.90 pack-year smoking history. She has never used smokeless tobacco. She reports that she does not drink alcohol or use drugs.   Family History: The patient's family history includes Diabetes in her father and mother; Kidney failure in her mother.   ROS:  Please see the history of present illness. Otherwise, complete review of systems is positive for {NONE DEFAULTED:18576::"none"}.  All other systems are reviewed and negative.   Physical Exam: VS:  There were no vitals taken for this visit., BMI There is no height or weight on file to calculate BMI.  Wt Readings from Last 3 Encounters:  03/02/17 282 lb (127.9 kg)  01/08/17 273 lb (123.8 kg)  12/22/16 273 lb (123.8 kg)    General: Patient appears comfortable at rest. HEENT: Conjunctiva and lids normal, oropharynx clear with moist mucosa. Neck: Supple, no elevated JVP or carotid bruits, no thyromegaly. Lungs: Clear to auscultation, nonlabored breathing at rest. Cardiac: Regular rate and rhythm, no S3 or  significant systolic murmur, no pericardial rub. Abdomen: Soft, nontender, no hepatomegaly, bowel sounds present, no guarding or rebound. Extremities: No pitting edema, distal pulses 2+. Skin: Warm and dry. Musculoskeletal: No kyphosis. Neuropsychiatric: Alert and oriented x3, affect grossly appropriate.  ECG: I personally reviewed the tracing from 08/07/2016 which showed sinus rhythm with borderline low voltage and nonspecific ST changes.  Recent Labwork: 01/08/2017: Hemoglobin 12.6; Potassium 4.7; Sodium 138     Component Value Date/Time   CHOL 245 (H) 05/23/2015 0545   TRIG 278 (H) 05/23/2015 0545   HDL 45 05/23/2015 0545   CHOLHDL 5.4 05/23/2015 0545   VLDL  56 (H) 05/23/2015 0545   LDLCALC 144 (H) 05/23/2015 0545    Other Studies Reviewed Today:  Cardiac catheterization 06/01/2015: Prox Cx lesion, 30% stenosed.   The coronary arteries are widely patent with minimal plaque noted. 40 cc of contrast was used  Left ventricular end-diastolic pressure is mildly elevated. Left ventriculography was not performed in an attempt to minimize contrast exposure.  Asystole during adenosine infusion represents an excessive pharmacologic response to the agent and likely superimposed vasovagal response.  Echocardiogram 09/04/2014: Study Conclusions  - Left ventricle: The cavity size was at the upper limits of normal. Systolic function was normal. The estimated ejection fraction was in the range of 55% to 60%. Wall motion was normal; there were no regional wall motion abnormalities. Features are consistent with a pseudonormal left ventricular filling pattern, with concomitant abnormal relaxation and increased filling pressure (grade 2 diastolic dysfunction). Doppler parameters are consistent with high ventricular filling pressure. - Aortic valve: There was mild regurgitation. - Left atrium: The atrium was mildly dilated. Volume/bsa, ES, (1-plane Simpson&'s, A2C):  29.6 ml/m^2. - Atrial septum: No defect or patent foramen ovale was identified.  Assessment and Plan:    Current medicines were reviewed with the patient today.  No orders of the defined types were placed in this encounter.   Disposition:  Signed, Satira Sark, MD, [facility] 10/08/2017 8:38 AM    [facility] [place], [place], [place] [address] Phone: [phone]; Fax: [phone]

## 2017-10-09 ENCOUNTER — Ambulatory Visit: Payer: Medicaid Other | Admitting: Cardiology

## 2017-10-10 ENCOUNTER — Encounter: Payer: Self-pay | Admitting: Cardiology

## 2017-11-09 NOTE — Progress Notes (Deleted)
Cardiology Office Note  Date: 11/09/2017   ID: Samantha Pierce, DOB 11/17/1970, MRN [id number]  PCP: [facility], [facility]  Primary Cardiologist: Rozann Lesches, MD   No chief complaint on file.   History of Present Illness: Samantha Pierce is a 47 y.o. female last seen in May 2018.  Past Medical History:  Diagnosis Date  . Anemia   . Anxiety   . Arthritis    left arm  . Asystole ([place])    a. 05/2015: pt developed bradycardia->asystole during [place] nuclear stress test, s/p brief code. Cath with only 30% prox LCx. Her asystole during Lexiscan infusion was felt to represent an excessive pharmacologic response to the agent and likely superimposed vasovagal response.  . CKD (chronic kidney disease) stage 3, GFR 30-59 ml/min    stage 4, not on dialysis  . COPD (chronic obstructive pulmonary disease) ([place])   . Depression   . Diabetes mellitus with diabetic retinopathy    type 2  . Dyspnea   . Essential hypertension   . GERD (gastroesophageal reflux disease)   . History of blood transfusion   . History of stroke April 2016   Acute lacunar infarct of the left thalamus  . History of stroke   . Leg cramps   . Mild CAD    a. LHC 05/2015: 30% prox Cx, otherwise widely patent.  . Morbid obesity ([place])   . Neuropathy    both arms, feet  . PTSD (post-traumatic stress disorder)   . Seizures ([place])    has had one 12/26/15 - due to high blood sugar  . Stroke [facility])    right arm weakness, decrease feeling in fingers right hand  . TIA (transient ischemic attack)     Past Surgical History:  Procedure Laterality Date  . AV FISTULA PLACEMENT Right 01/08/2017   Procedure: RIGHT ARM BRACHIOCEPHALIC ARTERIOVENOUS (AV) FISTULA CREATION;  Surgeon: Waynetta Sandy, MD;  Location: [place];  Service: Vascular;  Laterality: Right;  . CARDIAC CATHETERIZATION N/A 06/01/2015   Procedure: Left Heart Cath and Coronary Angiography;  Surgeon: Belva Crome, MD; CFX 30%, no  other dz, EF nl by echo  . CESAREAN SECTION     x2  . DILATION AND CURETTAGE OF UTERUS    . EYE SURGERY     Right eye pars plano vitrectomy   . LASIK    . PARS PLANA VITRECTOMY  04/20/2011   Procedure: PARS PLANA VITRECTOMY WITH 25 GAUGE;  Surgeon: Hayden Pedro, MD;  Location: [place];  Service: Ophthalmology;  Laterality: Left;  membrane peel, gas fluid exchange, endolaser, repair of complex retinal detachment  . TUBAL LIGATION      Current Outpatient Medications  Medication Sig Dispense Refill  . albuterol (PROVENTIL HFA;VENTOLIN HFA) 108 (90 BASE) MCG/ACT inhaler Inhale 1-2 puffs into the lungs every 6 (six) hours as needed for wheezing or shortness of breath. 1 Inhaler 0  . amLODipine (NORVASC) 10 MG tablet Take 10 mg by mouth daily.    Marland Kitchen atorvastatin (LIPITOR) 80 MG tablet Take 1 tablet (80 mg total) by mouth daily at 6 PM. (Patient taking differently: Take 80 mg by mouth daily. ) 30 tablet 6  . brimonidine-timolol (COMBIGAN) 0.2-0.5 % ophthalmic solution Place 1 drop into both eyes at bedtime.    . cloNIDine (CATAPRES) 0.2 MG tablet Take 0.2 mg by mouth 2 (two) times daily.     . clopidogrel (PLAVIX) 75 MG tablet Take 1 tablet (75 mg total)  by mouth daily. 30 tablet 0  . FLUoxetine (PROZAC) 20 MG capsule Take 20 mg by mouth daily.    . furosemide (LASIX) 40 MG tablet Take 40 mg by mouth daily.    Marland Kitchen gabapentin (NEURONTIN) 300 MG capsule Take 1 capsule (300 mg total) by mouth at bedtime as needed (For numbness and tingling). (Patient taking differently: Take 300 mg by mouth daily. )    . HYDROcodone-acetaminophen (NORCO/VICODIN) 5-325 MG tablet Take one-two tabs po q 4-6 hrs prn pain 10 tablet 0  . insulin aspart (NOVOLOG) 100 UNIT/ML FlexPen Inject 0-20 Units into the skin 3 (three) times daily with meals. Sliding scale    . Insulin Detemir (LEVEMIR) 100 UNIT/ML Pen Inject 60 Units into the skin at bedtime. (Patient taking differently: Inject 80 Units into the skin at bedtime. )    .  lisinopril (PRINIVIL,ZESTRIL) 20 MG tablet Take 20 mg by mouth daily.    Marland Kitchen losartan (COZAAR) 25 MG tablet Take 25 mg by mouth daily.    . methocarbamol (ROBAXIN) 500 MG tablet Take 1 tablet (500 mg total) by mouth 3 (three) times daily. 21 tablet 0  . metoprolol succinate (TOPROL-XL) 25 MG 24 hr tablet Take 25 mg by mouth daily.    Marland Kitchen oxyCODONE-acetaminophen (ROXICET) 5-325 MG tablet Take 1 tablet by mouth every 4 (four) hours as needed for severe pain. 6 tablet 0  . tiotropium (SPIRIVA) 18 MCG inhalation capsule Place 18 mcg into inhaler and inhale daily.    . traZODone (DESYREL) 100 MG tablet Take 150 mg by mouth at bedtime.      No current facility-administered medications for this visit.    Allergies:  Contrast media [iodinated diagnostic agents] and Penicillins   Social History: The patient  reports that she quit smoking about 10 months ago. Her smoking use included cigarettes. She started smoking about 36 years ago. She has a 0.90 pack-year smoking history. She has never used smokeless tobacco. She reports that she does not drink alcohol or use drugs.   Family History: The patient's family history includes Diabetes in her father and mother; Kidney failure in her mother.   ROS:  Please see the history of present illness. Otherwise, complete review of systems is positive for {NONE DEFAULTED:18576::"none"}.  All other systems are reviewed and negative.   Physical Exam: VS:  There were no vitals taken for this visit., BMI There is no height or weight on file to calculate BMI.  Wt Readings from Last 3 Encounters:  03/02/17 282 lb (127.9 kg)  01/08/17 273 lb (123.8 kg)  12/22/16 273 lb (123.8 kg)    General: Patient appears comfortable at rest. HEENT: Conjunctiva and lids normal, oropharynx clear with moist mucosa. Neck: Supple, no elevated JVP or carotid bruits, no thyromegaly. Lungs: Clear to auscultation, nonlabored breathing at rest. Cardiac: Regular rate and rhythm, no S3 or  significant systolic murmur, no pericardial rub. Abdomen: Soft, nontender, no hepatomegaly, bowel sounds present, no guarding or rebound. Extremities: No pitting edema, distal pulses 2+. Skin: Warm and dry. Musculoskeletal: No kyphosis. Neuropsychiatric: Alert and oriented x3, affect grossly appropriate.  ECG: I personally reviewed the tracing from 08/07/2016 which showed sinus rhythm with nonspecific ST changes.  Recent Labwork: 01/08/2017: Hemoglobin 12.6; Potassium 4.7; Sodium 138     Component Value Date/Time   CHOL 245 (H) 05/23/2015 0545   TRIG 278 (H) 05/23/2015 0545   HDL 45 05/23/2015 0545   CHOLHDL 5.4 05/23/2015 0545   VLDL 56 (H) 05/23/2015 0545  [place] 144 (H) 05/23/2015 0545    Other Studies Reviewed Today:  Cardiac catheterization 06/01/2015: Prox Cx lesion, 30% stenosed.   The coronary arteries are widely patent with minimal plaque noted. 40 cc of contrast was used  Left ventricular end-diastolic pressure is mildly elevated. Left ventriculography was not performed in an attempt to minimize contrast exposure.  Asystole during adenosine infusion represents an excessive pharmacologic response to the agent and likely superimposed vasovagal response.  Echocardiogram 09/04/2014: Study Conclusions  - Left ventricle: The cavity size was at the upper limits of normal. Systolic function was normal. The estimated ejection fraction was in the range of 55% to 60%. Wall motion was normal; there were no regional wall motion abnormalities. Features are consistent with a pseudonormal left ventricular filling pattern, with concomitant abnormal relaxation and increased filling pressure (grade 2 diastolic dysfunction). Doppler parameters are consistent with high ventricular filling pressure. - Aortic valve: There was mild regurgitation. - Left atrium: The atrium was mildly dilated. Volume/bsa, ES, (1-plane Simpson&'s, A2C): 29.6 ml/m^2. - Atrial  septum: No defect or patent foramen ovale was identified.  Assessment and Plan:   Current medicines were reviewed with the patient today.  No orders of the defined types were placed in this encounter.   Disposition:  Signed, Satira Sark, MD, [facility] 11/09/2017 4:17 PM    [facility] [address] [place], [place] [address] Phone: [phone]; Fax: [phone]

## 2017-11-12 ENCOUNTER — Ambulatory Visit: Payer: Medicaid Other | Admitting: Cardiology

## 2017-11-16 ENCOUNTER — Encounter (INDEPENDENT_AMBULATORY_CARE_PROVIDER_SITE_OTHER): Payer: Medicaid Other | Admitting: Ophthalmology

## 2017-11-18 ENCOUNTER — Observation Stay (HOSPITAL_COMMUNITY)
Admission: EM | Admit: 2017-11-18 | Discharge: 2017-11-19 | Disposition: A | Discharge status: Home / Self Care | Payer: Medicaid Other | Attending: Internal Medicine | Admitting: Internal Medicine

## 2017-11-18 ENCOUNTER — Other Ambulatory Visit: Payer: Self-pay

## 2017-11-18 ENCOUNTER — Encounter (HOSPITAL_COMMUNITY): Payer: Self-pay | Admitting: Emergency Medicine

## 2017-11-18 DIAGNOSIS — K219 Gastro-esophageal reflux disease without esophagitis: Secondary | ICD-10-CM | POA: Diagnosis not present

## 2017-11-18 DIAGNOSIS — Z7901 Long term (current) use of anticoagulants: Secondary | ICD-10-CM | POA: Insufficient documentation

## 2017-11-18 DIAGNOSIS — R739 Hyperglycemia, unspecified: Secondary | ICD-10-CM | POA: Diagnosis present

## 2017-11-18 DIAGNOSIS — E1165 Type 2 diabetes mellitus with hyperglycemia: Secondary | ICD-10-CM

## 2017-11-18 DIAGNOSIS — Z8673 Personal history of transient ischemic attack (TIA), and cerebral infarction without residual deficits: Secondary | ICD-10-CM | POA: Diagnosis not present

## 2017-11-18 DIAGNOSIS — E785 Hyperlipidemia, unspecified: Secondary | ICD-10-CM | POA: Diagnosis not present

## 2017-11-18 DIAGNOSIS — Z79899 Other long term (current) drug therapy: Secondary | ICD-10-CM | POA: Diagnosis not present

## 2017-11-18 DIAGNOSIS — I129 Hypertensive chronic kidney disease with stage 1 through stage 4 chronic kidney disease, or unspecified chronic kidney disease: Secondary | ICD-10-CM | POA: Insufficient documentation

## 2017-11-18 DIAGNOSIS — E1122 Type 2 diabetes mellitus with diabetic chronic kidney disease: Secondary | ICD-10-CM | POA: Insufficient documentation

## 2017-11-18 DIAGNOSIS — N183 Chronic kidney disease, stage 3 unspecified: Secondary | ICD-10-CM | POA: Diagnosis present

## 2017-11-18 DIAGNOSIS — IMO0002 Reserved for concepts with insufficient information to code with codable children: Secondary | ICD-10-CM | POA: Diagnosis present

## 2017-11-18 DIAGNOSIS — I2510 Atherosclerotic heart disease of native coronary artery without angina pectoris: Secondary | ICD-10-CM | POA: Diagnosis not present

## 2017-11-18 DIAGNOSIS — Z6841 Body Mass Index (BMI) 40.0 and over, adult: Secondary | ICD-10-CM | POA: Diagnosis not present

## 2017-11-18 DIAGNOSIS — I10 Essential (primary) hypertension: Secondary | ICD-10-CM | POA: Diagnosis present

## 2017-11-18 DIAGNOSIS — Z794 Long term (current) use of insulin: Secondary | ICD-10-CM | POA: Insufficient documentation

## 2017-11-18 DIAGNOSIS — J449 Chronic obstructive pulmonary disease, unspecified: Secondary | ICD-10-CM | POA: Insufficient documentation

## 2017-11-18 DIAGNOSIS — N179 Acute kidney failure, unspecified: Secondary | ICD-10-CM | POA: Diagnosis not present

## 2017-11-18 DIAGNOSIS — N184 Chronic kidney disease, stage 4 (severe): Secondary | ICD-10-CM | POA: Diagnosis present

## 2017-11-18 DIAGNOSIS — F17210 Nicotine dependence, cigarettes, uncomplicated: Secondary | ICD-10-CM | POA: Insufficient documentation

## 2017-11-18 DIAGNOSIS — E669 Obesity, unspecified: Secondary | ICD-10-CM | POA: Diagnosis present

## 2017-11-18 DIAGNOSIS — E10319 Type 1 diabetes mellitus with unspecified diabetic retinopathy without macular edema: Secondary | ICD-10-CM

## 2017-11-18 HISTORY — DX: Difficulty in walking, not elsewhere classified: R26.2

## 2017-11-18 LAB — BASIC METABOLIC PANEL
Anion gap: 8 (ref 5–15)
BUN: 35 mg/dL — ABNORMAL HIGH (ref 6–20)
CO2: 25 mmol/L (ref 22–32)
Calcium: 8.3 mg/dL — ABNORMAL LOW (ref 8.9–10.3)
Chloride: 98 mmol/L — ABNORMAL LOW (ref 101–111)
Creatinine, Ser: 3.95 mg/dL — ABNORMAL HIGH (ref 0.44–1.00)
GFR calc Af Amer: 15 mL/min — ABNORMAL LOW (ref >60)
GFR calc non Af Amer: 13 mL/min — ABNORMAL LOW (ref >60)
Glucose, Bld: 281 mg/dL — ABNORMAL HIGH (ref 65–99)
Potassium: 4.5 mmol/L (ref 3.5–5.1)
Sodium: 131 mmol/L — ABNORMAL LOW (ref 135–145)

## 2017-11-18 LAB — CBC WITH DIFFERENTIAL/PLATELET
Basophils Absolute: 0 10*3/uL (ref 0.0–0.1)
Basophils Relative: 1 %
Eosinophils Absolute: 0.7 10*3/uL (ref 0.0–0.7)
Eosinophils Relative: 8 %
HCT: 38.4 % (ref 36.0–46.0)
Hemoglobin: 11.8 g/dL — ABNORMAL LOW (ref 12.0–15.0)
Lymphocytes Relative: 35 %
Lymphs Abs: 3 10*3/uL (ref 0.7–4.0)
MCH: 25.6 pg — ABNORMAL LOW (ref 26.0–34.0)
MCHC: 30.7 g/dL (ref 30.0–36.0)
MCV: 83.3 fL (ref 78.0–100.0)
Monocytes Absolute: 0.9 10*3/uL (ref 0.1–1.0)
Monocytes Relative: 11 %
Neutro Abs: 3.8 10*3/uL (ref 1.7–7.7)
Neutrophils Relative %: 45 %
Platelets: 259 10*3/uL (ref 150–400)
RBC: 4.61 MIL/uL (ref 3.87–5.11)
RDW: 13.6 % (ref 11.5–15.5)
WBC: 8.4 10*3/uL (ref 4.0–10.5)

## 2017-11-18 LAB — CBG MONITORING, ED: Glucose-Capillary: 266 mg/dL — ABNORMAL HIGH (ref 65–99)

## 2017-11-18 MED ORDER — SODIUM CHLORIDE 0.9 % IV BOLUS
1000.0000 mL | Freq: Once | INTRAVENOUS | Status: AC
  Administered 2017-11-18: 1000 mL via INTRAVENOUS

## 2017-11-18 NOTE — ED Provider Notes (Signed)
[facility] EMERGENCY DEPARTMENT Provider Note   CSN: [id number] Arrival date & time: 11/18/17  2047     History   Chief Complaint Chief Complaint  Patient presents with  . Hyperglycemia    HPI Samantha Pierce is a 47 y.o. female.  HPI   47 year old female with history of significant past medical history including insulin-dependent diabetes, CAD, CKD, CVA, obesity, hypertension presenting complaining of hypoglycemia.  Patient mention she does not normally check her blood sugar.  But she does try checking it at least once a day usually in the morning.  Her blood sugars usually runs in the 200s however for the past 3 to 4 days it has been as high as 450.  She endorsed increasing thirst, but not having urge to urinate, generalized fatigue and mild nausea.  She believes her elevated blood sugar is likely due to her poor diet.  She denies any recent sickness.  She does not complain of any active pain at this time.  No fever, chest pain, trouble breathing, or dysuria.  No focal numbness or weakness.  She feels that IV fluid may help her symptoms.  She denies any prior history of DKA.  Past Medical History:  Diagnosis Date  . Anemia   . Anxiety   . Arthritis    left arm  . Asystole ([place])    a. 05/2015: pt developed bradycardia->asystole during [facility] nuclear stress test, s/p brief code. Cath with only 30% prox LCx. Her asystole during Lexiscan infusion was felt to represent an excessive pharmacologic response to the agent and likely superimposed vasovagal response.  . CKD (chronic kidney disease) stage 3, GFR 30-59 ml/min (HCC)    stage 4, not on dialysis  . COPD (chronic obstructive pulmonary disease) ([place])   . Depression   . Diabetes mellitus with diabetic retinopathy    type 2  . Difficulty walking   . Dyspnea   . Essential hypertension   . GERD (gastroesophageal reflux disease)   . History of blood transfusion   . History of stroke April 2016   Acute lacunar infarct of the  left thalamus  . History of stroke   . Leg cramps   . Mild CAD    a. LHC 05/2015: 30% prox Cx, otherwise widely patent.  . Morbid obesity ([place])   . Neuropathy    both arms, feet  . PTSD (post-traumatic stress disorder)   . Seizures ([place])    has had one 12/26/15 - due to high blood sugar  . Stroke [facility])    right arm weakness, decrease feeling in fingers right hand  . TIA (transient ischemic attack)     Patient Active Problem List   Diagnosis Date Noted  . Mild CAD 06/02/2015  . Bradycardia 06/02/2015  . Morbid obesity ([place])   . Asystole ([place])   . Pain in the chest   . Hyperlipidemia   . TIA (transient ischemic attack) 05/22/2015  . Diastolic dysfunction 24/40/1027  . Essential hypertension 09/04/2014  . Diabetes mellitus with diabetic retinopathy 09/04/2014  . Lacunar stroke, acute ([place]) 09/04/2014  . Paresthesias/numbness 09/04/2014  . CKD (chronic kidney disease), stage III ([place]) 09/04/2014  . Obesity 09/04/2014  . CVA (cerebral infarction) 09/04/2014  . Proliferative diabetic retinopathy of both eyes ([place]) 04/11/2011  . Retinal detachment, traction 04/11/2011    Past Surgical History:  Procedure Laterality Date  . AV FISTULA PLACEMENT Right 01/08/2017   Procedure: RIGHT ARM BRACHIOCEPHALIC ARTERIOVENOUS (AV) FISTULA CREATION;  Surgeon: Waynetta Sandy,  MD;  Location: [place];  Service: Vascular;  Laterality: Right;  . CARDIAC CATHETERIZATION N/A 06/01/2015   Procedure: Left Heart Cath and Coronary Angiography;  Surgeon: Belva Crome, MD; CFX 30%, no other dz, EF nl by echo  . CESAREAN SECTION     x2  . DILATION AND CURETTAGE OF UTERUS    . EYE SURGERY     Right eye pars plano vitrectomy   . LASIK    . PARS PLANA VITRECTOMY  04/20/2011   Procedure: PARS PLANA VITRECTOMY WITH 25 GAUGE;  Surgeon: Hayden Pedro, MD;  Location: [place];  Service: Ophthalmology;  Laterality: Left;  membrane peel, gas fluid exchange, endolaser, repair of complex retinal detachment    . TUBAL LIGATION       OB History    Gravida  2   Para  2   Term  2   Preterm      AB      Living  2     SAB      TAB      Ectopic      Multiple      Live Births               Home Medications    Prior to Admission medications   Medication Sig Start Date End Date Taking? Authorizing Provider  albuterol (PROVENTIL HFA;VENTOLIN HFA) 108 (90 BASE) MCG/ACT inhaler Inhale 1-2 puffs into the lungs every 6 (six) hours as needed for wheezing or shortness of breath. 06/20/14   Nat Christen, MD  amLODipine (NORVASC) 10 MG tablet Take 10 mg by mouth daily.    [provider]  atorvastatin (LIPITOR) 80 MG tablet Take 1 tablet (80 mg total) by mouth daily at 6 PM. Patient taking differently: Take 80 mg by mouth daily.  08/05/15   Satira Sark, MD  brimonidine-timolol (COMBIGAN) 0.2-0.5 % ophthalmic solution Place 1 drop into both eyes at bedtime.    [provider]  cloNIDine (CATAPRES) 0.2 MG tablet Take 0.2 mg by mouth 2 (two) times daily.     [provider]  clopidogrel (PLAVIX) 75 MG tablet Take 1 tablet (75 mg total) by mouth daily. 05/24/15   Hongalgi, Lenis Dickinson, MD  FLUoxetine (PROZAC) 20 MG capsule Take 20 mg by mouth daily.    [provider]  furosemide (LASIX) 40 MG tablet Take 40 mg by mouth daily.    [provider]  gabapentin (NEURONTIN) 300 MG capsule Take 1 capsule (300 mg total) by mouth at bedtime as needed (For numbness and tingling). Patient taking differently: Take 300 mg by mouth daily.  09/05/14   Rexene Alberts, MD  HYDROcodone-acetaminophen (NORCO/VICODIN) 5-325 MG tablet Take one-two tabs po q 4-6 hrs prn pain 08/07/16   Triplett, Tammy, PA-C  insulin aspart (NOVOLOG) 100 UNIT/ML FlexPen Inject 0-20 Units into the skin 3 (three) times daily with meals. Sliding scale    [provider]  Insulin Detemir (LEVEMIR) 100 UNIT/ML Pen Inject 60 Units into the skin at bedtime. Patient taking differently:  Inject 80 Units into the skin at bedtime.  05/24/15   Hongalgi, Lenis Dickinson, MD  lisinopril (PRINIVIL,ZESTRIL) 20 MG tablet Take 20 mg by mouth daily.    [provider]  losartan (COZAAR) 25 MG tablet Take 25 mg by mouth daily.    [provider]  methocarbamol (ROBAXIN) 500 MG tablet Take 1 tablet (500 mg total) by mouth 3 (three) times daily. 08/07/16   Triplett,  Tammy, PA-C  metoprolol succinate (TOPROL-XL) 25 MG 24 hr tablet Take 25 mg by mouth daily.    [provider]  oxyCODONE-acetaminophen (ROXICET) 5-325 MG tablet Take 1 tablet by mouth every 4 (four) hours as needed for severe pain. 01/08/17   Waynetta Sandy, MD  tiotropium (SPIRIVA) 18 MCG inhalation capsule Place 18 mcg into inhaler and inhale daily.    [provider]  traZODone (DESYREL) 100 MG tablet Take 150 mg by mouth at bedtime.     [provider]    Family History Family History  Problem Relation Age of Onset  . Diabetes Mother   . Kidney failure Mother   . Diabetes Father     Social History Social History   Tobacco Use  . Smoking status: Current Every Day Smoker    Packs/day: 0.03    Years: 30.00    Pack years: 0.90    Types: Cigarettes    Start date: 06/05/1981    Last attempt to quit: 12/25/2016    Years since quitting: 0.8  . Smokeless tobacco: Never Used  Substance Use Topics  . Alcohol use: No    Alcohol/week: 0.0 oz  . Drug use: No     Allergies   Contrast media [iodinated diagnostic agents] and Penicillins   Review of Systems Review of Systems  All other systems reviewed and are negative.    Physical Exam Updated Vital Signs BP 138/77   Pulse 68   Temp 97.8 F (36.6 C)   Resp 18   Ht 5\' 4"  (1.626 m)   Wt 112.5 kg (248 lb)   LMP 10/19/2017   SpO2 96%   BMI 42.57 kg/m   Physical Exam  Constitutional: She appears well-developed and well-nourished. No distress.  Moderately obese female laying in bed, appears drowsy but arousable.    HENT:  Head: Atraumatic.  Mouth/Throat: Oropharynx is clear and moist.  Eyes: Conjunctivae and EOM are normal.  Neck: Normal range of motion. Neck supple.  Cardiovascular: Normal rate and regular rhythm.  Pulmonary/Chest: Effort normal and breath sounds normal.  Abdominal: Soft. Bowel sounds are normal. She exhibits no distension. There is no tenderness.  Neurological: She is alert. She has normal strength. No cranial nerve deficit or sensory deficit. GCS eye subscore is 4. GCS verbal subscore is 5. GCS motor subscore is 6.  Skin: No rash noted.  Psychiatric: She has a normal mood and affect.  Nursing note and vitals reviewed.    ED Treatments / Results  Labs (all labs ordered are listed, but only abnormal results are displayed) Labs Reviewed  CBC WITH DIFFERENTIAL/PLATELET - Abnormal; Notable for the following components:      Result Value   Hemoglobin 11.8 (*)    MCH 25.6 (*)    All other components within normal limits  BASIC METABOLIC PANEL - Abnormal; Notable for the following components:   Sodium 131 (*)    Chloride 98 (*)    Glucose, Bld 281 (*)    BUN 35 (*)    Creatinine, Ser 3.95 (*)    Calcium 8.3 (*)    GFR calc non Af Amer 13 (*)    GFR calc Af Amer 15 (*)    All other components within normal limits  CBG MONITORING, ED - Abnormal; Notable for the following components:   Glucose-Capillary 266 (*)    All other components within normal limits  TROPONIN I  URINALYSIS, ROUTINE W REFLEX MICROSCOPIC    EKG EKG Interpretation  Date/Time:  Sunday November 18 2017 23:25:07 EDT Ventricular Rate:  64 PR Interval:    QRS Duration: 91 QT Interval:  404 QTC Calculation: 417 R Axis:   34 Text Interpretation:  Sinus rhythm When compared with ECG of 06/01/2015 No significant change was found Confirmed by McManus, Kathleen ([id number]) on 11/18/2017 11:31:25 PM   ED ECG REPORT   Date: 11/18/2017  Rate: 64  Rhythm: normal sinus rhythm  QRS Axis: normal  Intervals:  normal  ST/T Wave abnormalities: normal  Conduction Disutrbances: none  Narrative Interpretation:   Old EKG Reviewed: No significant changes noted     Radiology No results found.  Procedures Procedures (including critical care time)  Medications Ordered in ED Medications  sodium chloride 0.9 % bolus 1,000 mL (1,000 mLs Intravenous New Bag/Given 11/18/17 2314)     Initial Impression / Assessment and Plan / ED Course  I have reviewed the triage vital signs and the nursing notes.  Pertinent labs & imaging results that were available during my care of the patient were reviewed by me and considered in my medical decision making (see chart for details).     BP 138/77   Pulse 68   Temp 97.8 F (36.6 C)   Resp 18   Ht 5\' 4" (1.626 m)   Wt 112.5 kg (248 lb)   LMP 10/19/2017   SpO2 96%   BMI 42.57 kg/m    Final Clinical Impressions(s) / ED Diagnoses   Final diagnoses:  Hyperglycemia  AKI (acute kidney injury) (HCC)    ED Discharge Orders    None     10 :22 PM Patient with history of insulin-dependent diabetes, who have been controlling her diet presenting with symptoms suggestive of hyperglycemia.  Her initial CBG is 266.  Patient is drowsy.  We will give IV fluid, check labs to ensure no DKA.  Will check urine to rule out underlying infection that can increase her blood sugar.  12:14 AM Her EKG is normal with normal troponin. CBG is 281 without an anion gap.  Patient has known history of CKD however her renal function appears worsened.  Her BUN is 35, creatinine is 3.95 with a GFR of 15.  This is much worse than her baseline.  Appreciate consultation with [facility] Dr. Hendricks Milo who agrees to see and admit pt for further management.    Domenic Moras, PA-C 11/19/17 Maxwell, [place], DO 11/22/17 1645

## 2017-11-18 NOTE — ED Triage Notes (Signed)
Pt c/o frequent urination, bilateral foot pain, dizziness and states her blood sugar has been in upper 300s-400s.

## 2017-11-19 DIAGNOSIS — N179 Acute kidney failure, unspecified: Principal | ICD-10-CM

## 2017-11-19 DIAGNOSIS — K219 Gastro-esophageal reflux disease without esophagitis: Secondary | ICD-10-CM

## 2017-11-19 DIAGNOSIS — E10319 Type 1 diabetes mellitus with unspecified diabetic retinopathy without macular edema: Secondary | ICD-10-CM

## 2017-11-19 DIAGNOSIS — Z6841 Body Mass Index (BMI) 40.0 and over, adult: Secondary | ICD-10-CM

## 2017-11-19 DIAGNOSIS — E782 Mixed hyperlipidemia: Secondary | ICD-10-CM

## 2017-11-19 DIAGNOSIS — I10 Essential (primary) hypertension: Secondary | ICD-10-CM

## 2017-11-19 DIAGNOSIS — J449 Chronic obstructive pulmonary disease, unspecified: Secondary | ICD-10-CM | POA: Diagnosis present

## 2017-11-19 DIAGNOSIS — N183 Chronic kidney disease, stage 3 (moderate): Secondary | ICD-10-CM

## 2017-11-19 LAB — GLUCOSE, CAPILLARY
Glucose-Capillary: 250 mg/dL — ABNORMAL HIGH (ref 65–99)
Glucose-Capillary: 244 mg/dL — ABNORMAL HIGH (ref 65–99)
Glucose-Capillary: 94 mg/dL (ref 65–99)

## 2017-11-19 LAB — URINALYSIS, ROUTINE W REFLEX MICROSCOPIC
Bilirubin Urine: NEGATIVE
Glucose, UA: 150 mg/dL — AB
Hgb urine dipstick: NEGATIVE
Ketones, ur: NEGATIVE mg/dL
Leukocytes, UA: NEGATIVE
Nitrite: NEGATIVE
Protein, ur: 100 mg/dL — AB
Specific Gravity, Urine: 1.01 (ref 1.005–1.030)
pH: 6 (ref 5.0–8.0)

## 2017-11-19 LAB — CBC
HCT: 39.6 % (ref 36.0–46.0)
Hemoglobin: 12 g/dL (ref 12.0–15.0)
MCH: 25.3 pg — ABNORMAL LOW (ref 26.0–34.0)
MCHC: 30.3 g/dL (ref 30.0–36.0)
MCV: 83.5 fL (ref 78.0–100.0)
Platelets: 262 10*3/uL (ref 150–400)
RBC: 4.74 MIL/uL (ref 3.87–5.11)
RDW: 13.3 % (ref 11.5–15.5)
WBC: 6.5 10*3/uL (ref 4.0–10.5)

## 2017-11-19 LAB — COMPREHENSIVE METABOLIC PANEL
ALT: 18 U/L (ref 14–54)
AST: 19 U/L (ref 15–41)
Albumin: 3 g/dL — ABNORMAL LOW (ref 3.5–5.0)
Alkaline Phosphatase: 45 U/L (ref 38–126)
Anion gap: 7 (ref 5–15)
BUN: 36 mg/dL — ABNORMAL HIGH (ref 6–20)
CO2: 24 mmol/L (ref 22–32)
Calcium: 8.5 mg/dL — ABNORMAL LOW (ref 8.9–10.3)
Chloride: 104 mmol/L (ref 101–111)
Creatinine, Ser: 3.36 mg/dL — ABNORMAL HIGH (ref 0.44–1.00)
GFR calc Af Amer: 18 mL/min — ABNORMAL LOW (ref >60)
GFR calc non Af Amer: 15 mL/min — ABNORMAL LOW (ref >60)
Glucose, Bld: 279 mg/dL — ABNORMAL HIGH (ref 65–99)
Potassium: 4.6 mmol/L (ref 3.5–5.1)
Sodium: 135 mmol/L (ref 135–145)
Total Bilirubin: 0.4 mg/dL (ref 0.3–1.2)
Total Protein: 5.9 g/dL — ABNORMAL LOW (ref 6.5–8.1)

## 2017-11-19 LAB — TROPONIN I: Troponin I: <0.03 ng/mL (ref <0.03)

## 2017-11-19 MED ORDER — INSULIN DETEMIR 100 UNIT/ML ~~LOC~~ SOLN
80.0000 [IU] | Freq: Every day | SUBCUTANEOUS | Status: DC
  Administered 2017-11-19: 80 [IU] via SUBCUTANEOUS
  Filled 2017-11-19 (×2): qty 0.8

## 2017-11-19 MED ORDER — CLOPIDOGREL BISULFATE 75 MG PO TABS
75.0000 mg | ORAL_TABLET | Freq: Every day | ORAL | Status: DC
  Administered 2017-11-19: 75 mg via ORAL
  Filled 2017-11-19: qty 1

## 2017-11-19 MED ORDER — PANTOPRAZOLE SODIUM 40 MG PO TBEC: 40.0000 mg | DELAYED_RELEASE_TABLET | Freq: Every day | ORAL | 1 refills | Status: DC

## 2017-11-19 MED ORDER — LOSARTAN POTASSIUM 25 MG PO TABS: 25.0000 mg | ORAL_TABLET | Freq: Every day | ORAL | Status: DC

## 2017-11-19 MED ORDER — BRIMONIDINE TARTRATE-TIMOLOL 0.2-0.5 % OP SOLN
1.0000 [drp] | Freq: Every day | OPHTHALMIC | Status: DC
  Filled 2017-11-19: qty 5

## 2017-11-19 MED ORDER — INSULIN DETEMIR 100 UNIT/ML FLEXPEN: 40.0000 [IU] | PEN_INJECTOR | Freq: Two times a day (BID) | SUBCUTANEOUS | Status: DC

## 2017-11-19 MED ORDER — TIOTROPIUM BROMIDE MONOHYDRATE 18 MCG IN CAPS
18.0000 ug | ORAL_CAPSULE | Freq: Every day | RESPIRATORY_TRACT | Status: DC
  Administered 2017-11-19: 18 ug via RESPIRATORY_TRACT
  Filled 2017-11-19: qty 5

## 2017-11-19 MED ORDER — ONDANSETRON HCL 4 MG PO TABS: 4.0000 mg | ORAL_TABLET | Freq: Four times a day (QID) | ORAL | Status: DC | PRN

## 2017-11-19 MED ORDER — INSULIN ASPART 100 UNIT/ML ~~LOC~~ SOLN
0.0000 [IU] | Freq: Three times a day (TID) | SUBCUTANEOUS | Status: DC
  Administered 2017-11-19: 7 [IU] via SUBCUTANEOUS

## 2017-11-19 MED ORDER — PANTOPRAZOLE SODIUM 40 MG PO TBEC: 40.0000 mg | DELAYED_RELEASE_TABLET | Freq: Every day | ORAL | Status: DC

## 2017-11-19 MED ORDER — FUROSEMIDE 40 MG PO TABS: 40.0000 mg | ORAL_TABLET | Freq: Every day | ORAL | Status: DC

## 2017-11-19 MED ORDER — FLUOXETINE HCL 20 MG PO CAPS
20.0000 mg | ORAL_CAPSULE | Freq: Every day | ORAL | Status: DC
  Administered 2017-11-19: 20 mg via ORAL
  Filled 2017-11-19: qty 1

## 2017-11-19 MED ORDER — BISACODYL 5 MG PO TBEC
5.0000 mg | DELAYED_RELEASE_TABLET | Freq: Every day | ORAL | Status: DC | PRN
  Administered 2017-11-19: 5 mg via ORAL
  Filled 2017-11-19: qty 1

## 2017-11-19 MED ORDER — METOPROLOL SUCCINATE ER 25 MG PO TB24
25.0000 mg | ORAL_TABLET | Freq: Every day | ORAL | Status: DC
  Administered 2017-11-19: 25 mg via ORAL
  Filled 2017-11-19: qty 1

## 2017-11-19 MED ORDER — ENOXAPARIN SODIUM 30 MG/0.3ML ~~LOC~~ SOLN
30.0000 mg | SUBCUTANEOUS | Status: DC
  Administered 2017-11-19: 30 mg via SUBCUTANEOUS
  Filled 2017-11-19: qty 0.3

## 2017-11-19 MED ORDER — METHOCARBAMOL 500 MG PO TABS
500.0000 mg | ORAL_TABLET | Freq: Three times a day (TID) | ORAL | Status: DC
  Administered 2017-11-19: 500 mg via ORAL
  Filled 2017-11-19: qty 1

## 2017-11-19 MED ORDER — ACETAMINOPHEN 325 MG PO TABS: 650.0000 mg | ORAL_TABLET | Freq: Four times a day (QID) | ORAL | Status: DC | PRN

## 2017-11-19 MED ORDER — BRIMONIDINE TARTRATE 0.2 % OP SOLN: 1.0000 [drp] | Freq: Every day | OPHTHALMIC | Status: DC

## 2017-11-19 MED ORDER — TRAZODONE HCL 50 MG PO TABS: 150.0000 mg | ORAL_TABLET | Freq: Every day | ORAL | Status: DC

## 2017-11-19 MED ORDER — ONDANSETRON HCL 4 MG/2ML IJ SOLN: 4.0000 mg | Freq: Four times a day (QID) | INTRAMUSCULAR | Status: DC | PRN

## 2017-11-19 MED ORDER — SODIUM CHLORIDE 0.45 % IV BOLUS
1000.0000 mL | Freq: Once | INTRAVENOUS | Status: AC
  Administered 2017-11-19: 1000 mL via INTRAVENOUS

## 2017-11-19 MED ORDER — HYDROCODONE-ACETAMINOPHEN 5-325 MG PO TABS: 1.0000 | ORAL_TABLET | Freq: Four times a day (QID) | ORAL | Status: DC | PRN

## 2017-11-19 MED ORDER — CLONIDINE HCL 0.2 MG PO TABS
0.2000 mg | ORAL_TABLET | Freq: Two times a day (BID) | ORAL | Status: DC
  Administered 2017-11-19 (×2): 0.2 mg via ORAL
  Filled 2017-11-19 (×2): qty 1

## 2017-11-19 MED ORDER — ACETAMINOPHEN 650 MG RE SUPP: 650.0000 mg | Freq: Four times a day (QID) | RECTAL | Status: DC | PRN

## 2017-11-19 MED ORDER — TIMOLOL MALEATE 0.5 % OP SOLN: 1.0000 [drp] | Freq: Every day | OPHTHALMIC | Status: DC

## 2017-11-19 MED ORDER — MILK AND MOLASSES ENEMA: 1.0000 | Freq: Once | RECTAL | Status: DC | PRN

## 2017-11-19 MED ORDER — SODIUM CHLORIDE 0.45 % IV SOLN
INTRAVENOUS | Status: DC
  Administered 2017-11-19: 04:00:00 via INTRAVENOUS

## 2017-11-19 MED ORDER — AMLODIPINE BESYLATE 5 MG PO TABS
10.0000 mg | ORAL_TABLET | Freq: Every day | ORAL | Status: DC
  Administered 2017-11-19: 10 mg via ORAL
  Filled 2017-11-19: qty 2

## 2017-11-19 MED ORDER — ATORVASTATIN CALCIUM 40 MG PO TABS: 80.0000 mg | ORAL_TABLET | Freq: Every day | ORAL | Status: DC

## 2017-11-19 NOTE — ED Notes (Signed)
Urine sample provided.

## 2017-11-19 NOTE — Progress Notes (Signed)
Inpatient Diabetes Program Recommendations  AACE/ADA: New Consensus Statement on Inpatient Glycemic Control (2019)  Target Ranges:  Prepandial:   less than 140 mg/dL      Peak postprandial:   less than 180 mg/dL (1-2 hours)      Critically ill patients:  140 - 180 mg/dL   Results for Samantha Pierce, Samantha Pierce (MRN [id number]) as of 11/19/2017 08:04  Ref. Range 11/18/2017 21:20 11/19/2017 01:42 11/19/2017 07:55  Glucose-Capillary Latest Ref Range: 65 - 99 mg/dL 266 (H) 250 (H) 244 (H)   Review of Glycemic Control  Diabetes history: DM2 Outpatient Diabetes medications: Levemir 80 units QHS, Novolog 0-20 units TID with meals Current orders for Inpatient glycemic control: Levemir 80 units QHS, Novolog 0-20 units TID with meals  Inpatient Diabetes Program Recommendations: Correction (SSI): Please consider ordering Novolog 0-5 units QHS for bedtime correction. HgbA1C: Please consider ordering an A1C to evaluate glycemic control over the past 2-3 months.  Thanks, Barnie Alderman, RN, MSN, CDE Diabetes Coordinator Inpatient Diabetes Program [phone] (Team Pager from 8am to 5pm)

## 2017-11-19 NOTE — H&P (Signed)
History and Physical    Samantha Pierce DXA:[id number] DOB: 12-31-1970 DOA: 11/18/2017  PCP: Medicine, [facility]   Patient coming from: Home.  I have personally briefly reviewed patient's old medical records in [place]  Chief Complaint: Frequent urination and elevated blood sugar.  HPI: Samantha Pierce is a 47 y.o. female with medical history significant of anemia, anxiety, depression, osteoarthritis, history of bradycardia and asystole during Lexiscan nuclear stress test, history of mild CAD, stage III chronic kidney disease, COPD, type 2 diabetes, hypertension, GERD, history of CVA who is coming to the emergency department due to polyuria, polydipsia, dizziness, fatigue, mild nausea, and blurred vision associated with an increase in her blood glucose from the 200s to 400s for the past 4 to 5 days.  No fever, chills, sore throat, dyspnea, wheezing, hemoptysis, chest pain, palpitations, diaphoresis, PND, orthopnea, abdominal pain, emesis, diarrhea, melena or hematochezia.  However, she complains of constipation for the past 6 days.  No dysuria or gross hematuria.  ED Course: Initial vital signs temperature 97.8 F, pulse 68, respirations 18, blood pressure 138/77 mmHg and O2 sat 96% on room air.  She received a 1000 mL in the ED.  Her urinalysis shows glucosuria 150 and proteinuria 100 mg/dL.  CBC shows a White counts 8.4  with 45% neutrophils, 35% lymphocytes and 11% monocytes.  Platelet count was 259.  Sodium 131, potassium 4.5, chloride 98 and CO2 25 mmol/L.  Glucose 281, BUN 35, creatinine 3.95 and calcium 8.3 mg/dL.  Troponin level was less than 0.03 ng/mL.  EKG was sinus rhythm at 64 bpm.  Review of Systems: As per HPI otherwise 10 point review of systems negative.  Past Medical History:  Diagnosis Date  . Anemia   . Anxiety   . Arthritis    left arm  . Asystole ([place])    a. 05/2015: pt developed bradycardia->asystole during [place] nuclear stress test,  s/p brief code. Cath with only 30% prox LCx. Her asystole during Lexiscan infusion was felt to represent an excessive pharmacologic response to the agent and likely superimposed vasovagal response.  . CKD (chronic kidney disease) stage 3, GFR 30-59 ml/min (HCC)    stage 4, not on dialysis  . COPD (chronic obstructive pulmonary disease) ([place])   . Depression   . Diabetes mellitus with diabetic retinopathy    type 2  . Difficulty walking   . Dyspnea   . Essential hypertension   . GERD (gastroesophageal reflux disease)   . History of blood transfusion   . History of stroke April 2016   Acute lacunar infarct of the left thalamus  . History of stroke   . Leg cramps   . Mild CAD    a. LHC 05/2015: 30% prox Cx, otherwise widely patent.  . Morbid obesity ([place])   . Neuropathy    both arms, feet  . PTSD (post-traumatic stress disorder)   . Seizures ([place])    has had one 12/26/15 - due to high blood sugar  . Stroke [facility])    right arm weakness, decrease feeling in fingers right hand  . TIA (transient ischemic attack)     Past Surgical History:  Procedure Laterality Date  . AV FISTULA PLACEMENT Right 01/08/2017   Procedure: RIGHT ARM BRACHIOCEPHALIC ARTERIOVENOUS (AV) FISTULA CREATION;  Surgeon: Waynetta Sandy, MD;  Location: [place];  Service: Vascular;  Laterality: Right;  . CARDIAC CATHETERIZATION N/A 06/01/2015   Procedure: Left Heart Cath and Coronary Angiography;  Surgeon: Mallie Mussel  Nicholes Stairs, MD; CFX 30%, no other dz, EF nl by echo  . CESAREAN SECTION     x2  . DILATION AND CURETTAGE OF UTERUS    . EYE SURGERY     Right eye pars plano vitrectomy   . LASIK    . PARS PLANA VITRECTOMY  04/20/2011   Procedure: PARS PLANA VITRECTOMY WITH 25 GAUGE;  Surgeon: Hayden Pedro, MD;  Location: [place];  Service: Ophthalmology;  Laterality: Left;  membrane peel, gas fluid exchange, endolaser, repair of complex retinal detachment  . TUBAL LIGATION       reports that she has been smoking  cigarettes.  She started smoking about 36 years ago. She has a 0.90 pack-year smoking history. She has never used smokeless tobacco. She reports that she does not drink alcohol or use drugs.  Allergies  Allergen Reactions  . Contrast Media [Iodinated Diagnostic Agents] Anaphylaxis    "code blue--I died"  . Penicillins Rash    Has patient had a PCN reaction causing immediate rash, facial/tongue/throat swelling, SOB or lightheadedness with hypotension: Yes Has patient had a PCN reaction causing severe rash involving mucus membranes or skin necrosis: No Has patient had a PCN reaction that required hospitalization No Has patient had a PCN reaction occurring within the last 10 years: No If all of the above answers are "NO", then may proceed with Cephalosporin use.     Family History  Problem Relation Age of Onset  . Diabetes Mother   . Kidney failure Mother   . Diabetes Father    Prior to Admission medications   Medication Sig Start Date End Date Taking? Authorizing Provider  albuterol (PROVENTIL HFA;VENTOLIN HFA) 108 (90 BASE) MCG/ACT inhaler Inhale 1-2 puffs into the lungs every 6 (six) hours as needed for wheezing or shortness of breath. 06/20/14  Yes Nat Christen, MD  amLODipine (NORVASC) 10 MG tablet Take 10 mg by mouth daily.   Yes [provider]  atorvastatin (LIPITOR) 80 MG tablet Take 1 tablet (80 mg total) by mouth daily at 6 PM. Patient taking differently: Take 80 mg by mouth daily.  08/05/15  Yes Satira Sark, MD  brimonidine-timolol (COMBIGAN) 0.2-0.5 % ophthalmic solution Place 1 drop into both eyes at bedtime.   Yes [provider]  cloNIDine (CATAPRES) 0.2 MG tablet Take 0.2 mg by mouth 2 (two) times daily.    Yes [provider]  clopidogrel (PLAVIX) 75 MG tablet Take 1 tablet (75 mg total) by mouth daily. 05/24/15  Yes Hongalgi, Lenis Dickinson, MD  FLUoxetine (PROZAC) 20 MG capsule Take 20 mg by mouth daily.   Yes [provider]  furosemide  (LASIX) 40 MG tablet Take 40 mg by mouth daily.   Yes [provider]  gabapentin (NEURONTIN) 300 MG capsule Take 1 capsule (300 mg total) by mouth at bedtime as needed (For numbness and tingling). Patient taking differently: Take 300 mg by mouth daily.  09/05/14  Yes Rexene Alberts, MD  HYDROcodone-acetaminophen (NORCO/VICODIN) 5-325 MG tablet Take one-two tabs po q 4-6 hrs prn pain 08/07/16  Yes Triplett, Tammy, PA-C  insulin aspart (NOVOLOG) 100 UNIT/ML FlexPen Inject 0-20 Units into the skin 3 (three) times daily with meals. Sliding scale   Yes [provider]  Insulin Detemir (LEVEMIR) 100 UNIT/ML Pen Inject 60 Units into the skin at bedtime. Patient taking differently: Inject 80 Units into the skin at bedtime.  05/24/15  Yes Hongalgi, Lenis Dickinson, MD  losartan (COZAAR) 25 MG tablet Take  25 mg by mouth daily.   Yes [provider]  metoprolol succinate (TOPROL-XL) 25 MG 24 hr tablet Take 25 mg by mouth daily.   Yes [provider]  tiotropium (SPIRIVA) 18 MCG inhalation capsule Place 18 mcg into inhaler and inhale daily.   Yes [provider]  traZODone (DESYREL) 100 MG tablet Take 150 mg by mouth at bedtime.    Yes [provider]  methocarbamol (ROBAXIN) 500 MG tablet Take 1 tablet (500 mg total) by mouth 3 (three) times daily. 08/07/16   Kem Parkinson, PA-C    Physical Exam: Vitals:   11/18/17 2116 11/19/17 0108 11/19/17 0132  BP: 138/77 (!) 158/70 (!) 168/80  Pulse: 68 71 66  Resp: 18  18  Temp: 97.8 F (36.6 C)  98 F (36.7 C)  TempSrc:   Oral  SpO2: 96% 100% 100%  Weight: 112.5 kg (248 lb)    Height: 5\' 4"  (1.626 m)      Constitutional: NAD, calm, comfortable Eyes: PERRL, lids and conjunctivae normal ENMT: Mucous membranes are dry. Posterior pharynx clear of any exudate or lesions. Neck: normal, supple, no masses, no thyromegaly Respiratory: clear to auscultation bilaterally, no wheezing, no crackles. Normal respiratory effort.  No accessory muscle use.  Cardiovascular: Regular rate and rhythm, no murmurs / rubs / gallops. No no lower extremities pitting edema.  Stage I lymphedema.  2+ pedal pulses. No carotid bruits.  Abdomen: no tenderness, no masses palpated. No hepatosplenomegaly. Bowel sounds positive.  Musculoskeletal: no clubbing / cyanosis. Good ROM, no contractures. Normal muscle tone.  Skin: no gross rashes, lesions, ulcers on limited dermatological examination. Neurologic: CN 2-12 grossly intact. Sensation intact, DTR normal. Strength 5/5 in all 4.  Psychiatric: Normal judgment and insight. Alert and oriented x 3. Normal mood.    Labs on Admission: I have personally reviewed following labs and imaging studies  CBC: Recent Labs  Lab 11/18/17 2310  WBC 8.4  NEUTROABS 3.8  HGB 11.8*  HCT 38.4  MCV 83.3  PLT 283   Basic Metabolic Panel: Recent Labs  Lab 11/18/17 2310  NA 131*  K 4.5  CL 98*  CO2 25  GLUCOSE 281*  BUN 35*  CREATININE 3.95*  CALCIUM 8.3*   GFR: Estimated Creatinine Clearance: 21.6 mL/min (A) (by C-G formula based on SCr of 3.95 mg/dL (H)). Liver Function Tests: No results for input(s): AST, ALT, ALKPHOS, BILITOT, PROT, ALBUMIN in the last 168 hours. No results for input(s): LIPASE, AMYLASE in the last 168 hours. No results for input(s): AMMONIA in the last 168 hours. Coagulation Profile: No results for input(s): INR, PROTIME in the last 168 hours. Cardiac Enzymes: Recent Labs  Lab 11/18/17 2310  TROPONINI <0.03   BNP (last 3 results) No results for input(s): PROBNP in the last 8760 hours. HbA1C: No results for input(s): HGBA1C in the last 72 hours. CBG: Recent Labs  Lab 11/18/17 2120 11/19/17 0142  GLUCAP 266* 250*   Lipid Profile: No results for input(s): CHOL, HDL, LDLCALC, TRIG, CHOLHDL, LDLDIRECT in the last 72 hours. Thyroid Function Tests: No results for input(s): TSH, T4TOTAL, FREET4, T3FREE, THYROIDAB in the last 72 hours. Anemia Panel: No results  for input(s): VITAMINB12, FOLATE, FERRITIN, TIBC, IRON, RETICCTPCT in the last 72 hours. Urine analysis:    Component Value Date/Time   COLORURINE YELLOW 11/19/2017 0027   APPEARANCEUR HAZY (A) 11/19/2017 0027   LABSPEC 1.010 11/19/2017 0027   PHURINE 6.0 11/19/2017 0027   GLUCOSEU 150 (A) 11/19/2017 1517  HGBUR NEGATIVE 11/19/2017 0027   BILIRUBINUR NEGATIVE 11/19/2017 0027   KETONESUR NEGATIVE 11/19/2017 0027   PROTEINUR 100 (A) 11/19/2017 0027   UROBILINOGEN 0.2 09/03/2014 2255   NITRITE NEGATIVE 11/19/2017 0027   LEUKOCYTESUR NEGATIVE 11/19/2017 0027    Radiological Exams on Admission: No results found.  EKG: Independently reviewed. Vent. rate 64 BPM PR interval * ms QRS duration 91 ms QT/QTc 404/417 ms P-R-T axes 72 34 72 Sinus Rhythm.  Assessment/Plan Principal Problem:   AKI (acute kidney injury) ([place])   CKD (chronic kidney disease), stage III (HCC) Observation/telemetry. Continue IV fluids. Monitor intake and output. Follow-up renal function and electrolytes.  Active Problems:   Uncontrolled diabetes mellitus Last hemoglobin A1C 10.4%. Secondary to noncompliance. Carbohydrate modified diet. Continue Levemir 80 mg SQ at bedtime. Monitoring with regular insulin sliding scale.    Essential hypertension Hold furosemide and losartan for now. Monitor blood pressure, renal function and electrolytes.    Obesity Discussed briefly need for lifestyle modifications.    Hyperlipidemia Continue atorvastatin 80 mg p.o. daily. Monitor LFTs as needed.    Mild CAD Continue atorvastatin. Continue clopidogrel 75 mg p.o. daily.    COPD (chronic obstructive pulmonary disease) (HCC) Stable at this time. Supplemental oxygen and bronchodilators as needed.    GERD (gastroesophageal reflux disease) Protonix 40 mg p.o. at bedtime.     DVT prophylaxis: Lovenox SQ. Code Status: Full code. Family Communication:  Disposition Plan: Admit for IV hydration and blood  glucose control. Consults called: Admission status: Observation/telemetry.   Reubin Milan MD [facility] Pager [phone].  If 7PM-7AM, please contact night-coverage [email or web address] Password [email or web address]  11/19/2017, 2:22 AM

## 2017-11-19 NOTE — Discharge Summary (Signed)
Physician Discharge Summary  Samantha Pierce QZR:[id number] DOB: 09-Jan-1971 DOA: 11/18/2017  PCP: [facility], [facility]  Admit date: 11/18/2017 Discharge date: 11/19/2017  Time spent: 35 minutes  Recommendations for Outpatient Follow-up:  1. Repeat basic metabolic panel to follow electrolytes and renal function 2. Reassess blood pressure and further adjust antihypertensive regimen as needed.   Discharge Diagnoses:  Principal Problem:   AKI (acute kidney injury) ([place]) Active Problems:   Essential hypertension   Diabetes mellitus with retinopathy ([place])   CKD (chronic kidney disease), stage III (HCC)   Obesity   Hyperlipidemia   Mild CAD   COPD (chronic obstructive pulmonary disease) (HCC)   GERD (gastroesophageal reflux disease)   Discharge Condition: Stable and improved.  Patient instructed to follow-up with PCP in 10 days and with her nephrologist as previously scheduled.  Diet recommendation: Heart healthy diet, modified carbohydrates, low calorie diet.  Filed Weights   11/18/17 2116  Weight: 112.5 kg (248 lb)    History of present illness:  Per H&P written by Dr. Olevia Bowens on 11/19/2017. 47 y.o. female with medical history significant of anemia, anxiety, depression, osteoarthritis, history of bradycardia and asystole during [place] nuclear stress test, history of mild CAD, stage III chronic kidney disease, COPD, type 2 diabetes, hypertension, GERD, history of CVA who is coming to the emergency department due to polyuria, polydipsia, dizziness, fatigue, mild nausea, and blurred vision associated with an increase in her blood glucose from the 200s to 400s for the past 4 to 5 days.  No fever, chills, sore throat, dyspnea, wheezing, hemoptysis, chest pain, palpitations, diaphoresis, PND, orthopnea, abdominal pain, emesis, diarrhea, melena or hematochezia.  However, she complains of constipation for the past 6 days.  No dysuria or gross hematuria.  ED Course: Initial  vital signs temperature 97.8 F, pulse 68, respirations 18, blood pressure 138/77 mmHg and O2 sat 96% on room air.  She received a 1000 mL in the ED.  Hospital Course:  1-chronic kidney disease is stage III: With concerns for potential acute kidney injury in the setting of decrease hydration.  Over possibility issues for the progression of her underlying chronic disease. -GFR essentially unchanged -After IV fluids mild improvement appreciated and renal function. -Patient advised to keep herself well-hydrated -Will continue to hold diuretics and ARB on to follow-up with PCP. -Patient advised to follow heart healthy diet.  2-uncontrolled diabetes mellitus -A1c last check 10.4 -Patient reports inconsistency and no compliance with her insulin therapy. At this moment education/teaching has been provided; patient instructed to use her Levemir 40 units subcutaneously twice a day and to continue sliding scale as previously instructed. -Weight loss and modified carbohydrate diets encouraged.  3-essential hypertension -Blood pressure stable -Continue to hold Lasix and losartan at discharge -Resume the rest of her antihypertensive medication.  4-obesity: -Low calorie diet and increase exercise has been discussed with patient -portion control advised  -Body mass index is 42.57 kg/m.   5-HLD -continue statin  6-hx of CAD -no CP -continue statin, plavix and b-blocker   7-COPD -stable at this time -resume home inhaler regimen   8-GERD -continue PPI   Procedures:  See below for x-ray reports.  Consultations: None  Discharge Exam: Vitals:   11/19/17 0958 11/19/17 1157  BP: (!) 143/71   Pulse: 60   Resp:    Temp:    SpO2:  97%    General: Afebrile, no chest pain, no shortness of breath, no nausea or vomiting. Cardiovascular: S1 and S2, no rubs, no  gallops  Respiratory: Good air movement bilaterally, no wheezing, no crackles. Abdomen: Obese, Soft, nontender, nondistended,  positive bowel sounds. Extremities: No cyanosis, no clubbing. Neurologic exam: Cranial nerve grossly intact, no focal deficits appreciated.  Discharge Instructions   Discharge Instructions    Diet - low sodium heart healthy   Complete by:  As directed    Diet Carb Modified   Complete by:  As directed    Discharge instructions   Complete by:  As directed    Keep yourself well-hydrated Take medications as prescribed and do not missed any dose. Arrange follow-up with PCP in 10 days Follow-up with renal service as previously scheduled Be compliant with modified carbohydrates and heart healthy diet; also pay attention to calories intake and portion control.     Allergies as of 11/19/2017      Reactions   Contrast Media [iodinated Diagnostic Agents] Anaphylaxis   "code blue--I died"   Penicillins Rash   Has patient had a PCN reaction causing immediate rash, facial/tongue/throat swelling, SOB or lightheadedness with hypotension: Yes Has patient had a PCN reaction causing severe rash involving mucus membranes or skin necrosis: No Has patient had a PCN reaction that required hospitalization No Has patient had a PCN reaction occurring within the last 10 years: No If all of the above answers are "NO", then may proceed with Cephalosporin use.      Medication List    STOP taking these medications   methocarbamol 500 MG tablet Commonly known as:  ROBAXIN     TAKE these medications   albuterol 108 (90 Base) MCG/ACT inhaler Commonly known as:  PROVENTIL HFA;VENTOLIN HFA Inhale 1-2 puffs into the lungs every 6 (six) hours as needed for wheezing or shortness of breath.   amLODipine 10 MG tablet Commonly known as:  NORVASC Take 10 mg by mouth daily.   atorvastatin 80 MG tablet Commonly known as:  LIPITOR Take 1 tablet (80 mg total) by mouth daily at 6 PM. What changed:  when to take this   cloNIDine 0.2 MG tablet Commonly known as:  CATAPRES Take 0.2 mg by mouth 2 (two) times  daily.   clopidogrel 75 MG tablet Commonly known as:  PLAVIX Take 1 tablet (75 mg total) by mouth daily.   COMBIGAN 0.2-0.5 % ophthalmic solution Generic drug:  brimonidine-timolol Place 1 drop into both eyes at bedtime.   FLUoxetine 20 MG capsule Commonly known as:  PROZAC Take 20 mg by mouth daily.   furosemide 40 MG tablet Commonly known as:  LASIX Take 1 tablet (40 mg total) by mouth daily. Hold until follow up with PCP What changed:  additional instructions   gabapentin 300 MG capsule Commonly known as:  NEURONTIN Take 1 capsule (300 mg total) by mouth at bedtime as needed (For numbness and tingling). What changed:  when to take this   HYDROcodone-acetaminophen 5-325 MG tablet Commonly known as:  NORCO/VICODIN Take one-two tabs po q 4-6 hrs prn pain   insulin aspart 100 UNIT/ML FlexPen Commonly known as:  NOVOLOG Inject 0-20 Units into the skin 3 (three) times daily with meals. Sliding scale   Insulin Detemir 100 UNIT/ML Pen Commonly known as:  LEVEMIR Inject 40 Units into the skin 2 (two) times daily. What changed:    how much to take  when to take this   losartan 25 MG tablet Commonly known as:  COZAAR Take 1 tablet (25 mg total) by mouth daily. Hold until follow up with PCP What changed:  additional  instructions   metoprolol succinate 25 MG 24 hr tablet Commonly known as:  TOPROL-XL Take 25 mg by mouth daily.   pantoprazole 40 MG tablet Commonly known as:  PROTONIX Take 1 tablet (40 mg total) by mouth daily.   tiotropium 18 MCG inhalation capsule Commonly known as:  SPIRIVA Place 18 mcg into inhaler and inhale daily.   traZODone 100 MG tablet Commonly known as:  DESYREL Take 150 mg by mouth at bedtime.      Allergies  Allergen Reactions  . Contrast Media [Iodinated Diagnostic Agents] Anaphylaxis    "code blue--I died"  . Penicillins Rash    Has patient had a PCN reaction causing immediate rash, facial/tongue/throat swelling, SOB or  lightheadedness with hypotension: Yes Has patient had a PCN reaction causing severe rash involving mucus membranes or skin necrosis: No Has patient had a PCN reaction that required hospitalization No Has patient had a PCN reaction occurring within the last 10 years: No If all of the above answers are "NO", then may proceed with Cephalosporin use.    Follow-up Information    [facility]. Schedule an appointment as soon as possible for a visit on 11/21/2017.   Why:  10:00 Contact information: [place] [address] [phone]        Satira Sark, MD.   Specialty:  Cardiology Contact information: [place] [address] [phone]           The results of significant diagnostics from this hospitalization (including imaging, microbiology, ancillary and laboratory) are listed below for reference.    Labs: Basic Metabolic Panel: Recent Labs  Lab 11/18/17 2310 11/19/17 0524  NA 131* 135  K 4.5 4.6  CL 98* 104  CO2 25 24  GLUCOSE 281* 279*  BUN 35* 36*  CREATININE 3.95* 3.36*  CALCIUM 8.3* 8.5*   Liver Function Tests: Recent Labs  Lab 11/19/17 0524  AST 19  ALT 18  ALKPHOS 45  BILITOT 0.4  PROT 5.9*  ALBUMIN 3.0*   CBC: Recent Labs  Lab 11/18/17 2310 11/19/17 0524  WBC 8.4 6.5  NEUTROABS 3.8  --   HGB 11.8* 12.0  HCT 38.4 39.6  MCV 83.3 83.5  PLT 259 262   Cardiac Enzymes: Recent Labs  Lab 11/18/17 2310  TROPONINI <0.03    CBG: Recent Labs  Lab 11/18/17 2120 11/19/17 0142 11/19/17 0755 11/19/17 1141  GLUCAP 266* 250* 244* 94     Signed:  Barton Dubois MD.  [facility] 11/19/2017, 2:26 PM

## 2017-11-19 NOTE — ED Notes (Signed)
Pt ambulated to bathroom to void.

## 2017-11-19 NOTE — Progress Notes (Signed)
Patient IV removed, tolerated well. Patient given discharge instructions at bedside.  

## 2017-11-27 ENCOUNTER — Other Ambulatory Visit: Payer: Self-pay | Admitting: Internal Medicine

## 2017-11-27 DIAGNOSIS — Z1231 Encounter for screening mammogram for malignant neoplasm of breast: Secondary | ICD-10-CM

## 2017-11-29 NOTE — Progress Notes (Addendum)
[facility] Note  11/30/2017     CHIEF COMPLAINT Patient presents for Diabetic Eye Exam   HISTORY OF PRESENT ILLNESS: Samantha Pierce is a 47 y.o. female who presents to the clinic today for:   HPI    Diabetic Eye Exam    Associated Symptoms Negative for Flashes, Blind Spot, Photophobia, Scalp Tenderness, Fever, Weight Loss, Jaw Claudication, Glare, Pain, Floaters, Distortion, Redness, Trauma, Shoulder/Hip pain and Fatigue.  Diabetes characteristics include Type 2.  This started 35 years ago.  Blood sugar level is controlled.  Last Blood Glucose 104.  Last A1C 10.  I, the attending physician,  performed the HPI with the patient and updated documentation appropriately.          Comments    Pt presents on the referral of Dr. Marylynn Pearson for DM exam and surgical clearance, pt states she is considering having glaucoma and cataract surgery so Dr. Venetia Maxon wants her to have her retina looked at first, pt states she is legally blind OD, pt denies flashes, floaters, pain or wavy vision, pt states she is type 2 diabetic dx 35 years ago, her last A1C was 10, her BS was 104 this AM, pt is using Cosopt and Dorzolamide gtts       Last edited by Bernarda Caffey, MD on 11/30/2017  8:33 AM. (History)    Pt states she has had many ocular surgeries; Pt states since having seen Dr. Zigmund Daniel she has been having "black outs" in [place], pt states it usually happens when waking; Pt states she is using [place] and dorzolamide; Pt reports   Referring physician: Marylynn Pearson, MD [address] [place], [place] [address]  HISTORICAL INFORMATION:   Selected notes from the MEDICAL RECORD NUMBER Referred by Dr. Marylynn Pearson, Brooke Bonito for DM exam and surgical clearance  LEE: 06.13.2019 (R. Whitaker) [BCVA: OD: 20/400 OS: 20/40] Ocular Hx- Glaucoma, PDR, macular edema, Cataracts, 6 lasers for RD, 2 PPV OU, SLT OS Nasal OS, TIA, SLT Temporal OU PMH-Cardiac Arrest, DM (taking Novolog and  Levemir), HTN    CURRENT MEDICATIONS: Current Outpatient Medications (Ophthalmic Drugs)  Medication Sig  . dorzolamide (TRUSOPT) 2 % ophthalmic solution 1 drop 3 (three) times daily.  . brimonidine-timolol (COMBIGAN) 0.2-0.5 % ophthalmic solution Place 1 drop into both eyes at bedtime.   No current facility-administered medications for this visit.  (Ophthalmic Drugs)   Current Outpatient Medications (Other)  Medication Sig  . albuterol (PROVENTIL HFA;VENTOLIN HFA) 108 (90 BASE) MCG/ACT inhaler Inhale 1-2 puffs into the lungs every 6 (six) hours as needed for wheezing or shortness of breath.  Marland Kitchen amLODipine (NORVASC) 10 MG tablet Take 10 mg by mouth daily.  Marland Kitchen atorvastatin (LIPITOR) 80 MG tablet Take 1 tablet (80 mg total) by mouth daily at 6 PM. (Patient taking differently: Take 80 mg by mouth daily. )  . cloNIDine (CATAPRES) 0.2 MG tablet Take 0.2 mg by mouth 2 (two) times daily.   . clopidogrel (PLAVIX) 75 MG tablet Take 1 tablet (75 mg total) by mouth daily.  Marland Kitchen FLUoxetine (PROZAC) 20 MG capsule Take 20 mg by mouth daily.  . furosemide (LASIX) 40 MG tablet Take 1 tablet (40 mg total) by mouth daily. Hold until follow up with PCP  . gabapentin (NEURONTIN) 300 MG capsule Take 1 capsule (300 mg total) by mouth at bedtime as needed (For numbness and tingling). (Patient taking differently: Take 300 mg by mouth daily. )  . HYDROcodone-acetaminophen (NORCO/VICODIN)  5-325 MG tablet Take one-two tabs po q 4-6 hrs prn pain  . insulin aspart (NOVOLOG) 100 UNIT/ML FlexPen Inject 0-20 Units into the skin 3 (three) times daily with meals. Sliding scale  . Insulin Detemir (LEVEMIR) 100 UNIT/ML Pen Inject 40 Units into the skin 2 (two) times daily.  Marland Kitchen losartan (COZAAR) 25 MG tablet Take 1 tablet (25 mg total) by mouth daily. Hold until follow up with PCP  . metoprolol succinate (TOPROL-XL) 25 MG 24 hr tablet Take 25 mg by mouth daily.  . pantoprazole (PROTONIX) 40 MG tablet Take 1 tablet (40 mg total) by  mouth daily.  Marland Kitchen tiotropium (SPIRIVA) 18 MCG inhalation capsule Place 18 mcg into inhaler and inhale daily.  . traZODone (DESYREL) 100 MG tablet Take 150 mg by mouth at bedtime.    No current facility-administered medications for this visit.  (Other)      REVIEW OF SYSTEMS: ROS    Positive for: Endocrine, Cardiovascular, Eyes, Psychiatric   Negative for: Constitutional, Gastrointestinal, Neurological, Skin, Genitourinary, Musculoskeletal, HENT, Respiratory, Allergic/Imm, Heme/Lymph   Last edited by Debbrah Alar, COT on 11/30/2017  8:18 AM. (History)       ALLERGIES Allergies  Allergen Reactions  . Contrast Media [Iodinated Diagnostic Agents] Anaphylaxis    "code blue--I died"  . Penicillins Rash    Has patient had a PCN reaction causing immediate rash, facial/tongue/throat swelling, SOB or lightheadedness with hypotension: Yes Has patient had a PCN reaction causing severe rash involving mucus membranes or skin necrosis: No Has patient had a PCN reaction that required hospitalization No Has patient had a PCN reaction occurring within the last 10 years: No If all of the above answers are "NO", then may proceed with Cephalosporin use.     PAST MEDICAL HISTORY Past Medical History:  Diagnosis Date  . Anemia   . Anxiety   . Arthritis    left arm  . Asystole ([place])    a. 05/2015: pt developed bradycardia->asystole during [place] nuclear stress test, s/p brief code. Cath with only 30% prox LCx. Her asystole during Lexiscan infusion was felt to represent an excessive pharmacologic response to the agent and likely superimposed vasovagal response.  . CKD (chronic kidney disease) stage 3, GFR 30-59 ml/min (HCC)    stage 4, not on dialysis  . COPD (chronic obstructive pulmonary disease) ([place])   . Depression   . Diabetes mellitus with diabetic retinopathy    type 2  . Difficulty walking   . Dyspnea   . Essential hypertension   . GERD (gastroesophageal reflux disease)   .  History of blood transfusion   . History of stroke April 2016   Acute lacunar infarct of the left thalamus  . History of stroke   . Leg cramps   . Mild CAD    a. LHC 05/2015: 30% prox Cx, otherwise widely patent.  . Morbid obesity ([place])   . Neuropathy    both arms, feet  . PTSD (post-traumatic stress disorder)   . Seizures ([place])    has had one 12/26/15 - due to high blood sugar  . Stroke [facility])    right arm weakness, decrease feeling in fingers right hand  . TIA (transient ischemic attack)    Past Surgical History:  Procedure Laterality Date  . AV FISTULA PLACEMENT Right 01/08/2017   Procedure: RIGHT ARM BRACHIOCEPHALIC ARTERIOVENOUS (AV) FISTULA CREATION;  Surgeon: Waynetta Sandy, MD;  Location: [facility];  Service: Vascular;  Laterality: Right;  . CARDIAC CATHETERIZATION N/A  06/01/2015   Procedure: Left Heart Cath and Coronary Angiography;  Surgeon: Belva Crome, MD; CFX 30%, no other dz, EF nl by echo  . CESAREAN SECTION     x2  . DILATION AND CURETTAGE OF UTERUS    . EYE SURGERY     Right eye pars plano vitrectomy   . LASIK    . PARS PLANA VITRECTOMY  04/20/2011   Procedure: PARS PLANA VITRECTOMY WITH 25 GAUGE;  Surgeon: Hayden Pedro, MD;  Location: [facility];  Service: Ophthalmology;  Laterality: Left;  membrane peel, gas fluid exchange, endolaser, repair of complex retinal detachment  . TUBAL LIGATION      FAMILY HISTORY Family History  Problem Relation Age of Onset  . Diabetes Mother   . Kidney failure Mother   . Diabetes Father   . Amblyopia Neg Hx   . Blindness Neg Hx   . Cataracts Neg Hx   . Glaucoma Neg Hx   . Macular degeneration Neg Hx   . Retinal detachment Neg Hx   . Strabismus Neg Hx   . Retinitis pigmentosa Neg Hx     SOCIAL HISTORY Social History   Tobacco Use  . Smoking status: Current Every Day Smoker    Packs/day: 0.03    Years: 30.00    Pack years: 0.90    Types: Cigarettes    Start date: 06/05/1981    Last attempt to quit: 12/25/2016     Years since quitting: 0.9  . Smokeless tobacco: Never Used  Substance Use Topics  . Alcohol use: No    Alcohol/week: 0.0 oz  . Drug use: No         OPHTHALMIC EXAM:  Base Eye Exam    Visual Acuity (Snellen - Linear)      Right Left   Dist [place] 20/400 20/70 -2   Dist ph [place] 20/150 -1 20/40       Tonometry (Tonopen, 8:32 AM)      Right Left   Pressure 18 15       Pupils      Dark Light Shape React APD   Right 5 4 Round Slow None   Left 4 3 Round Slow None       Visual Fields (Counting fingers)      Left Right    Full Full       Extraocular Movement      Right Left    Full, Ortho Full, Ortho       Neuro/Psych    Oriented x3:  Yes   Mood/Affect:  Normal       Dilation    Both eyes:  1.0% Mydriacyl, 2.5% Phenylephrine @ 8:32 AM        Slit Lamp and Fundus Exam    Slit Lamp Exam      Right Left   Lids/Lashes Dermatochalasis - upper lid Dermatochalasis - upper lid   Conjunctiva/Sclera White and quiet White and quiet   Cornea Arcus Arcus   Anterior Chamber Deep and quiet Deep and quiet   Iris Round and dilated, No NVI Round and dilated, No NVI   Lens 2+ Nuclear sclerosis, 2+ Cortical cataract 2+ Nuclear sclerosis, 2+ Cortical cataract   Vitreous Post vitrectomy Post vitrectomy       Fundus Exam      Right Left   Disc inferior pigmented atrophy cupping, mild Inferior Peripapillary atrophy   C/D Ratio 0.5 0.8   Macula Epiretinal membrane with striae, fibrosis along inferior arcade  Blunted foveal reflex, mild Epiretinal membrane, No heme or edema   Vessels Sclerosis of arterioles Vascular attenuation   Periphery Attached, round atrophc hole superior to disc, good 360 PRP laser in place, scattered fibrosis Attached, Mild focal fibrosis along arcades; 360 PRP        Refraction    Manifest Refraction      Sphere Cylinder Axis Dist VA   Right -0.50 Sphere  20/400   Left -1.75 +0.50 025 20/40          IMAGING AND PROCEDURES  Imaging and Procedures  for @TODAY @  OCT, Retina - OU - Both Eyes       Right Eye Quality was good. Central Foveal Thickness: 257. Progression has been stable. Findings include abnormal foveal contour, no IRF, no SRF, retinal drusen , epiretinal membrane, outer retinal atrophy, inner retinal atrophy.   Left Eye Quality was good. Central Foveal Thickness: 242. Progression has been stable. Findings include abnormal foveal contour, epiretinal membrane, no IRF, no SRF.   Notes *Images captured and stored on drive  Diagnosis / Impression:  No frank DME OU OD: No IRF/SRF, diffuse retinal atrophy, ERM OS: No IRF/SRF, ERM  Clinical management:  See below  Abbreviations: NFP - Normal foveal profile. CME - cystoid macular edema. PED - pigment epithelial detachment. IRF - intraretinal fluid. SRF - subretinal fluid. EZ - ellipsoid zone. ERM - epiretinal membrane. ORA - outer retinal atrophy. ORT - outer retinal tubulation. SRHM - subretinal hyper-reflective material                  ASSESSMENT/PLAN:    ICD-10-CM   1. Stable proliferative diabetic retinopathy of both eyes associated with type 2 diabetes mellitus ([place]) K99.8338   2. Retinal edema H35.81 OCT, Retina - OU - Both Eyes  3. Epiretinal membrane (ERM) of both eyes H35.373   4. Essential hypertension I10   5. Hypertensive retinopathy of both eyes H35.033   6. Primary open angle glaucoma (POAG) of both eyes, severe stage H40.1133   7. Combined form of age-related cataract, both eyes H25.813     1,2. Proliferative diabetic retinopathy w/o DME, OU - stable - s/p PPV and extensive PRP OU - The incidence, risk factors for progression, natural history and treatment options for diabetic retinopathy were discussed with patient.   - The need for close monitoring of blood glucose, blood pressure, and serum lipids, avoiding cigarette or any type of tobacco, and the need for long term follow up was also discussed with patient. - exam shows scattered  fibrosis but no frank NV - OCT without diabetic macular edema, OU - f/u 3 months  - after cataract / glaucoma surgery  3. Epiretinal membrane, OU (OD > OS) The natural history, anatomy, potential for loss of vision, and treatment options including vitrectomy techniques and the complications of endophthalmitis, retinal detachment, vitreous hemorrhage, cataract progression and permanent vision loss discussed with the patient.  4,5. Hypertensive retinopathy OU - discussed importance of tight BP control - monitor  6. POAG severe stage OU-  - under the expert management of Dr. Venetia Maxon - planning for CE w/ trab OD with Dr. Venetia Maxon  7. Combined form age-related cataract OU-  - The symptoms of cataract, surgical options, and treatments and risks were discussed with patient. - discussed diagnosis and progression - likely visually significant - cleared from a retinal standpoint to proceed with combination cataract and glaucoma surgery (CE + trab OD) - will dictate letter to Dr. Venetia Maxon  Ophthalmic Meds Ordered this visit:  No orders of the defined types were placed in this encounter.      Return in about 3 months (around 03/02/2018) for F/U NPDR OU, DFE, OCT.  There are no Patient Instructions on file for this visit.   This document serves as a record of services personally performed by Gardiner Sleeper, MD, PhD. It was created on their behalf by Ernest Mallick, OA, an ophthalmic assistant. The creation of this record is the provider's dictation and/or activities during the visit.    Electronically signed by: Ernest Mallick, OA  06.27.2019 2:05 PM   This document serves as a record of services personally performed by Gardiner Sleeper, MD, PhD. It was created on their behalf by Catha Brow, [place], a certified ophthalmic assistant. The creation of this record is the provider's dictation and/or activities during the visit.  Electronically signed by: Catha Brow, [place]  06.28.19 2:05  PM   Explained the diagnoses, plan, and follow up with the patient and they expressed understanding.  Patient expressed understanding of the importance of proper follow up care.   Gardiner Sleeper, M.D., Ph.D. Diseases & Surgery of the Retina and Vitreous [facility]  I have reviewed the above documentation for accuracy and completeness, and I agree with the above. Gardiner Sleeper, M.D., Ph.D. 12/03/17 2:05 PM     Abbreviations: M myopia (nearsighted); A astigmatism; H hyperopia (farsighted); P presbyopia; Mrx spectacle prescription;  CTL contact lenses; OD right eye; OS left eye; OU both eyes  XT exotropia; ET esotropia; PEK punctate epithelial keratitis; PEE punctate epithelial erosions; DES dry eye syndrome; MGD meibomian gland dysfunction; ATs artificial tears; PFAT's preservative free artificial tears; [place] nuclear sclerotic cataract; PSC posterior subcapsular cataract; ERM epi-retinal membrane; PVD posterior vitreous detachment; RD retinal detachment; DM diabetes mellitus; DR diabetic retinopathy; NPDR non-proliferative diabetic retinopathy; PDR proliferative diabetic retinopathy; CSME clinically significant macular edema; DME diabetic macular edema; dbh dot blot hemorrhages; CWS cotton wool spot; POAG primary open angle glaucoma; C/D cup-to-disc ratio; HVF humphrey visual field; GVF goldmann visual field; OCT optical coherence tomography; IOP intraocular pressure; BRVO Branch retinal vein occlusion; CRVO central retinal vein occlusion; CRAO central retinal artery occlusion; BRAO branch retinal artery occlusion; RT retinal tear; SB scleral buckle; PPV pars plana vitrectomy; VH Vitreous hemorrhage; PRP panretinal laser photocoagulation; IVK intravitreal kenalog; VMT vitreomacular traction; MH Macular hole;  NVD neovascularization of the disc; NVE neovascularization elsewhere; AREDS age related eye disease study; ARMD age related macular degeneration; POAG primary open angle  glaucoma; EBMD epithelial/anterior basement membrane dystrophy; ACIOL anterior chamber intraocular lens; IOL intraocular lens; PCIOL posterior chamber intraocular lens; Phaco/IOL phacoemulsification with intraocular lens placement; [place] photorefractive keratectomy; LASIK laser assisted in situ keratomileusis; HTN hypertension; DM diabetes mellitus; COPD chronic obstructive pulmonary disease

## 2017-11-30 ENCOUNTER — Ambulatory Visit (INDEPENDENT_AMBULATORY_CARE_PROVIDER_SITE_OTHER): Payer: Medicaid Other | Admitting: Ophthalmology

## 2017-11-30 ENCOUNTER — Encounter (INDEPENDENT_AMBULATORY_CARE_PROVIDER_SITE_OTHER): Payer: Self-pay | Admitting: Ophthalmology

## 2017-11-30 DIAGNOSIS — H35033 Hypertensive retinopathy, bilateral: Secondary | ICD-10-CM

## 2017-11-30 DIAGNOSIS — I10 Essential (primary) hypertension: Secondary | ICD-10-CM | POA: Diagnosis not present

## 2017-11-30 DIAGNOSIS — E113553 Type 2 diabetes mellitus with stable proliferative diabetic retinopathy, bilateral: Secondary | ICD-10-CM

## 2017-11-30 DIAGNOSIS — H3581 Retinal edema: Secondary | ICD-10-CM

## 2017-11-30 DIAGNOSIS — H401133 Primary open-angle glaucoma, bilateral, severe stage: Secondary | ICD-10-CM

## 2017-11-30 DIAGNOSIS — H25813 Combined forms of age-related cataract, bilateral: Secondary | ICD-10-CM | POA: Diagnosis not present

## 2017-11-30 DIAGNOSIS — H35373 Puckering of macula, bilateral: Secondary | ICD-10-CM | POA: Diagnosis not present

## 2017-12-03 ENCOUNTER — Encounter (INDEPENDENT_AMBULATORY_CARE_PROVIDER_SITE_OTHER): Payer: Self-pay | Admitting: Ophthalmology

## 2017-12-13 ENCOUNTER — Other Ambulatory Visit: Payer: Self-pay

## 2017-12-13 ENCOUNTER — Emergency Department (HOSPITAL_COMMUNITY): Payer: Medicare Other

## 2017-12-13 ENCOUNTER — Encounter (HOSPITAL_COMMUNITY): Payer: Self-pay

## 2017-12-13 ENCOUNTER — Emergency Department (HOSPITAL_COMMUNITY)
Admission: EM | Admit: 2017-12-13 | Discharge: 2017-12-13 | Disposition: A | Discharge status: Home / Self Care | Payer: Medicare Other | Attending: Emergency Medicine | Admitting: Emergency Medicine

## 2017-12-13 DIAGNOSIS — Z794 Long term (current) use of insulin: Secondary | ICD-10-CM | POA: Diagnosis not present

## 2017-12-13 DIAGNOSIS — E11319 Type 2 diabetes mellitus with unspecified diabetic retinopathy without macular edema: Secondary | ICD-10-CM | POA: Insufficient documentation

## 2017-12-13 DIAGNOSIS — I129 Hypertensive chronic kidney disease with stage 1 through stage 4 chronic kidney disease, or unspecified chronic kidney disease: Secondary | ICD-10-CM | POA: Diagnosis not present

## 2017-12-13 DIAGNOSIS — E1122 Type 2 diabetes mellitus with diabetic chronic kidney disease: Secondary | ICD-10-CM | POA: Diagnosis not present

## 2017-12-13 DIAGNOSIS — F17210 Nicotine dependence, cigarettes, uncomplicated: Secondary | ICD-10-CM | POA: Insufficient documentation

## 2017-12-13 DIAGNOSIS — N184 Chronic kidney disease, stage 4 (severe): Secondary | ICD-10-CM | POA: Insufficient documentation

## 2017-12-13 DIAGNOSIS — S0512XA Contusion of eyeball and orbital tissues, left eye, initial encounter: Secondary | ICD-10-CM | POA: Insufficient documentation

## 2017-12-13 DIAGNOSIS — S0081XA Abrasion of other part of head, initial encounter: Secondary | ICD-10-CM

## 2017-12-13 DIAGNOSIS — E1140 Type 2 diabetes mellitus with diabetic neuropathy, unspecified: Secondary | ICD-10-CM | POA: Insufficient documentation

## 2017-12-13 DIAGNOSIS — Y999 Unspecified external cause status: Secondary | ICD-10-CM | POA: Insufficient documentation

## 2017-12-13 DIAGNOSIS — S0592XA Unspecified injury of left eye and orbit, initial encounter: Secondary | ICD-10-CM | POA: Diagnosis present

## 2017-12-13 DIAGNOSIS — J449 Chronic obstructive pulmonary disease, unspecified: Secondary | ICD-10-CM | POA: Insufficient documentation

## 2017-12-13 DIAGNOSIS — Z79899 Other long term (current) drug therapy: Secondary | ICD-10-CM | POA: Diagnosis not present

## 2017-12-13 DIAGNOSIS — Y929 Unspecified place or not applicable: Secondary | ICD-10-CM | POA: Diagnosis not present

## 2017-12-13 DIAGNOSIS — Y939 Activity, unspecified: Secondary | ICD-10-CM | POA: Insufficient documentation

## 2017-12-13 LAB — CBG MONITORING, ED: Glucose-Capillary: 161 mg/dL — ABNORMAL HIGH (ref 70–99)

## 2017-12-13 MED ORDER — TETRACAINE HCL 0.5 % OP SOLN
2.0000 [drp] | Freq: Once | OPHTHALMIC | Status: AC
  Administered 2017-12-13: 2 [drp] via OPHTHALMIC
  Filled 2017-12-13: qty 4

## 2017-12-13 MED ORDER — FLUORESCEIN SODIUM 1 MG OP STRP
1.0000 | ORAL_STRIP | Freq: Once | OPHTHALMIC | Status: AC
  Administered 2017-12-13: 1 via OPHTHALMIC
  Filled 2017-12-13: qty 1

## 2017-12-13 MED ORDER — CLONIDINE HCL 0.2 MG PO TABS
0.2000 mg | ORAL_TABLET | Freq: Once | ORAL | Status: AC
  Administered 2017-12-13: 0.2 mg via ORAL
  Filled 2017-12-13: qty 1

## 2017-12-13 MED ORDER — METOPROLOL SUCCINATE ER 25 MG PO TB24
25.0000 mg | ORAL_TABLET | Freq: Every day | ORAL | Status: DC
  Administered 2017-12-13: 25 mg via ORAL
  Filled 2017-12-13: qty 1

## 2017-12-13 MED ORDER — LOSARTAN POTASSIUM 25 MG PO TABS
25.0000 mg | ORAL_TABLET | Freq: Once | ORAL | Status: AC
  Administered 2017-12-13: 25 mg via ORAL
  Filled 2017-12-13: qty 1

## 2017-12-13 MED ORDER — HYDROCODONE-ACETAMINOPHEN 5-325 MG PO TABS
1.0000 | ORAL_TABLET | Freq: Once | ORAL | Status: AC
  Administered 2017-12-13: 1 via ORAL
  Filled 2017-12-13: qty 1

## 2017-12-13 MED ORDER — AMLODIPINE BESYLATE 5 MG PO TABS
10.0000 mg | ORAL_TABLET | Freq: Once | ORAL | Status: AC
  Administered 2017-12-13: 10 mg via ORAL
  Filled 2017-12-13: qty 2
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
## 2017-12-20 ENCOUNTER — Ambulatory Visit
Admission: RE | Admit: 2017-12-20 | Discharge: 2017-12-20 | Disposition: A | Discharge status: Home / Self Care | Payer: Medicare Other | Source: Ambulatory Visit | Attending: Internal Medicine | Admitting: Internal Medicine

## 2017-12-20 DIAGNOSIS — Z1231 Encounter for screening mammogram for malignant neoplasm of breast: Secondary | ICD-10-CM

## 2018-01-16 NOTE — Progress Notes (Signed)
Cardiology Office Note  Date: 01/17/2018   ID: Samantha Pierce, DOB March 03, 1971, MRN [id number]  PCP: [facility], [facility]  Primary Cardiologist: Rozann Lesches, MD   Chief Complaint  Patient presents with  . Cardiac follow-up    History of Present Illness: Samantha Pierce is a 47 y.o. female last seen in May 2018.  She is here for a routine follow-up visit.  Since last assessment she does not report any recurring episodes of chest pain.  She reports NYHA class II dyspnea with typical activities.  I reviewed interval records.  She was hospitalized in June with uncontrolled diabetes mellitus associated with acute on chronic renal insufficiency and CKD stage III at baseline.  She is following with Dr. Lowanda Foster at this time.  I personally reviewed her ECG today which shows sinus bradycardia.  I went over her medications, she reports compliance with her antihypertensive regimen and has good blood pressure control today.  Past Medical History:  Diagnosis Date  . Anemia   . Anxiety   . Arthritis    left arm  . Asystole ([place])    a. 05/2015: pt developed bradycardia->asystole during [place] nuclear stress test, s/p brief code. Cath with only 30% prox LCx. Her asystole during Lexiscan infusion was felt to represent an excessive pharmacologic response to the agent and likely superimposed vasovagal response.  . CKD (chronic kidney disease) stage 3, GFR 30-59 ml/min (HCC)    stage 4, not on dialysis  . COPD (chronic obstructive pulmonary disease) ([place])   . Depression   . Diabetes mellitus with diabetic retinopathy    type 2  . Difficulty walking   . Dyspnea   . Essential hypertension   . GERD (gastroesophageal reflux disease)   . History of blood transfusion   . History of stroke April 2016   Acute lacunar infarct of the left thalamus  . History of stroke   . Leg cramps   . Mild CAD    a. LHC 05/2015: 30% prox Cx, otherwise widely patent.  . Morbid obesity  ([place])   . Neuropathy    both arms, feet  . PTSD (post-traumatic stress disorder)   . Seizures ([place])    has had one 12/26/15 - due to high blood sugar  . Stroke [facility])    right arm weakness, decrease feeling in fingers right hand  . TIA (transient ischemic attack)     Past Surgical History:  Procedure Laterality Date  . AV FISTULA PLACEMENT Right 01/08/2017   Procedure: RIGHT ARM BRACHIOCEPHALIC ARTERIOVENOUS (AV) FISTULA CREATION;  Surgeon: Waynetta Sandy, MD;  Location: [place];  Service: Vascular;  Laterality: Right;  . CARDIAC CATHETERIZATION N/A 06/01/2015   Procedure: Left Heart Cath and Coronary Angiography;  Surgeon: Belva Crome, MD; CFX 30%, no other dz, EF nl by echo  . CESAREAN SECTION     x2  . DILATION AND CURETTAGE OF UTERUS    . EYE SURGERY     Right eye pars plano vitrectomy   . LASIK    . PARS PLANA VITRECTOMY  04/20/2011   Procedure: PARS PLANA VITRECTOMY WITH 25 GAUGE;  Surgeon: Hayden Pedro, MD;  Location: [place];  Service: Ophthalmology;  Laterality: Left;  membrane peel, gas fluid exchange, endolaser, repair of complex retinal detachment  . TUBAL LIGATION      Current Outpatient Medications  Medication Sig Dispense Refill  . albuterol (PROVENTIL HFA;VENTOLIN HFA) 108 (90 BASE) MCG/ACT inhaler Inhale 1-2 puffs into  the lungs every 6 (six) hours as needed for wheezing or shortness of breath. (Patient taking differently: Inhale 1-2 puffs into the lungs every 6 (six) hours as needed for shortness of breath. ) 1 Inhaler 0  . amLODipine (NORVASC) 10 MG tablet Take 10 mg by mouth daily.    Marland Kitchen atorvastatin (LIPITOR) 80 MG tablet Take 1 tablet (80 mg total) by mouth daily at 6 PM. (Patient taking differently: Take 80 mg by mouth daily. ) 30 tablet 6  . brimonidine-timolol (COMBIGAN) 0.2-0.5 % ophthalmic solution Place 1 drop into both eyes every 12 (twelve) hours.     . cloNIDine (CATAPRES) 0.2 MG tablet Take 0.2 mg by mouth 2 (two) times daily.     .  clopidogrel (PLAVIX) 75 MG tablet Take 1 tablet (75 mg total) by mouth daily. 30 tablet 0  . dorzolamide (TRUSOPT) 2 % ophthalmic solution Place 1 drop into both eyes 2 (two) times daily.     . furosemide (LASIX) 40 MG tablet Take 40 mg by mouth 2 (two) times daily.    Marland Kitchen HYDROcodone-acetaminophen (NORCO/VICODIN) 5-325 MG tablet Take one-two tabs po q 4-6 hrs prn pain (Patient taking differently: Take 1-2 tablets by mouth every 4 (four) hours as needed for severe pain (back pain and slip disc cause severe pain). ) 10 tablet 0  . insulin aspart (NOVOLOG) 100 UNIT/ML FlexPen Inject 2-10 Units into the skin 3 (three) times daily with meals. Sliding scale    . Insulin Detemir (LEVEMIR) 100 UNIT/ML Pen Inject 40 Units into the skin 2 (two) times daily. (Patient taking differently: Inject 80 Units into the skin at bedtime. )    . losartan (COZAAR) 25 MG tablet Take 1 tablet (25 mg total) by mouth daily. Hold until follow up with PCP (Patient taking differently: Take 25 mg by mouth daily. )    . metoprolol succinate (TOPROL-XL) 25 MG 24 hr tablet Take 25 mg by mouth daily.    . pantoprazole (PROTONIX) 40 MG tablet Take 1 tablet (40 mg total) by mouth daily. 30 tablet 1  . tiotropium (SPIRIVA) 18 MCG inhalation capsule Place 18 mcg into inhaler and inhale daily.    . traZODone (DESYREL) 100 MG tablet Take 150 mg by mouth at bedtime as needed for sleep.     Marland Kitchen FLUoxetine (PROZAC) 20 MG capsule Take 20 mg by mouth daily.     No current facility-administered medications for this visit.    Allergies:  Contrast media [iodinated diagnostic agents] and Penicillins   Social History: The patient  reports that she has been smoking cigarettes. She started smoking about 36 years ago. She has a 0.90 pack-year smoking history. She has never used smokeless tobacco. She reports that she does not drink alcohol or use drugs.   ROS:  Please see the history of present illness. Otherwise, complete review of systems is positive  for arthritic back pain.  All other systems are reviewed and negative.   Physical Exam: VS:  BP 110/68   Pulse (!) 58   Ht 5\' 4"  (1.626 m)   Wt 247 lb (112 kg)   SpO2 97%   BMI 42.40 kg/m , BMI Body mass index is 42.4 kg/m.  Wt Readings from Last 3 Encounters:  01/17/18 247 lb (112 kg)  12/13/17 252 lb (114.3 kg)  11/18/17 248 lb (112.5 kg)    General: Morbidly obese woman, appears comfortable at rest. HEENT: Conjunctiva and lids normal, oropharynx clear. Neck: Supple, no elevated JVP or carotid  bruits, no thyromegaly. Lungs: Clear to auscultation, nonlabored breathing at rest. Cardiac: Regular rate and rhythm, no S3, soft systolic murmur. Abdomen: Soft, nontender, bowel sounds present. Extremities: No pitting edema, distal pulses 2+. Skin: Warm and dry. Musculoskeletal: No kyphosis. Neuropsychiatric: Alert and oriented x3, affect grossly appropriate.  ECG: I personally reviewed the tracing from 11/18/2017 which showed sinus rhythm.  Recent Labwork: 11/19/2017: ALT 18; AST 19; BUN 36; Creatinine, Ser 3.36; Hemoglobin 12.0; Platelets 262; Potassium 4.6; Sodium 135     Component Value Date/Time   CHOL 245 (H) 05/23/2015 0545   TRIG 278 (H) 05/23/2015 0545   HDL 45 05/23/2015 0545   CHOLHDL 5.4 05/23/2015 0545   VLDL 56 (H) 05/23/2015 0545   LDLCALC 144 (H) 05/23/2015 0545    Other Studies Reviewed Today:  Cardiac catheterization 06/01/2015: Prox Cx lesion, 30% stenosed.   The coronary arteries are widely patent with minimal plaque noted. 40 cc of contrast was used  Left ventricular end-diastolic pressure is mildly elevated. Left ventriculography was not performed in an attempt to minimize contrast exposure.  Asystole during adenosine infusion represents an excessive pharmacologic response to the agent and likely superimposed vasovagal response.  Echocardiogram 09/04/2014: Study Conclusions  - Left ventricle: The cavity size was at the upper limits  of normal. Systolic function was normal. The estimated ejection fraction was in the range of 55% to 60%. Wall motion was normal; there were no regional wall motion abnormalities. Features are consistent with a pseudonormal left ventricular filling pattern, with concomitant abnormal relaxation and increased filling pressure (grade 2 diastolic dysfunction). Doppler parameters are consistent with high ventricular filling pressure. - Aortic valve: There was mild regurgitation. - Left atrium: The atrium was mildly dilated. Volume/bsa, ES, (1-plane Simpson&'s, A2C): 29.6 ml/m^2. - Atrial septum: No defect or patent foramen ovale was identified.  Assessment and Plan:  1.  Mild coronary atherosclerosis based on cardiac catheterization in December 2016.  She does not report progressive angina and remains on medical therapy which includes Plavix (history of stroke) and statin therapy.  ECG reviewed and stable.  2.  Morbid obesity.  She has lost weight over the last few years, but would like to try to lose 40 more pounds.  3.  CKD stage III-IV, following with Dr. Lowanda Foster at this point.  4.  Essential hypertension, blood pressure is well controlled on current regimen.  Current medicines were reviewed with the patient today.   Orders Placed This Encounter  Procedures  . EKG 12-Lead    Disposition: Follow-up in 1 year.  Signed, Satira Sark, MD, [facility] 01/17/2018 1:14 PM    [place] [facility] [address], [place], [place] [address] Phone: [phone]; Fax: [phone]

## 2018-01-17 ENCOUNTER — Encounter: Payer: Self-pay | Admitting: Cardiology

## 2018-01-17 ENCOUNTER — Ambulatory Visit (INDEPENDENT_AMBULATORY_CARE_PROVIDER_SITE_OTHER): Payer: Medicare Other | Admitting: Cardiology

## 2018-01-17 VITALS — BP 110/68 | HR 58 | Ht 64.0 in | Wt 247.0 lb

## 2018-01-17 DIAGNOSIS — I25810 Atherosclerosis of coronary artery bypass graft(s) without angina pectoris: Secondary | ICD-10-CM

## 2018-01-17 DIAGNOSIS — N184 Chronic kidney disease, stage 4 (severe): Secondary | ICD-10-CM

## 2018-01-17 DIAGNOSIS — I10 Essential (primary) hypertension: Secondary | ICD-10-CM | POA: Diagnosis not present

## 2018-01-17 NOTE — Patient Instructions (Signed)
Your physician wants you to follow-up in: 1 year with Dr.McDowell You will receive a reminder letter in the mail two months in advance. If you don't receive a letter, please call our office to schedule the follow-up appointment.     Your physician recommends that you continue on your current medications as directed. Please refer to the Current Medication list given to you today.    If you need a refill on your cardiac medications before your next appointment, please call your pharmacy.      No labs or tests today       Thank you for choosing [facility] !

## 2018-03-01 ENCOUNTER — Encounter (INDEPENDENT_AMBULATORY_CARE_PROVIDER_SITE_OTHER): Payer: Medicaid Other | Admitting: Ophthalmology

## 2018-03-06 ENCOUNTER — Encounter (INDEPENDENT_AMBULATORY_CARE_PROVIDER_SITE_OTHER): Payer: Medicare Other | Admitting: Ophthalmology

## 2018-03-08 NOTE — Progress Notes (Signed)
[facility] Note  03/11/2018     CHIEF COMPLAINT Patient presents for Retina Follow Up   HISTORY OF PRESENT ILLNESS: Samantha Pierce is a 47 y.o. female who presents to the clinic today for:   HPI    Retina Follow Up    Patient presents with  Diabetic Retinopathy.  In both eyes.  Severity is moderate.  Duration of 3 months.  Since onset it is stable.  I, the attending physician,  performed the HPI with the patient and updated documentation appropriately.          Comments    Pt presents for PDR OU f/u, pt states VA has been stable, pt states she does see floaters occasionally, but denies FOL and pain, pt has appt to have A1C checked in November, pts blood sugar was 174 this morning, pt states she is using her gtts but not as consistently as she should be, she is trying to remember them better       Last edited by Bernarda Caffey, MD on 03/11/2018 10:23 AM. (History)    Pt states she did not have surgery with Dr. Venetia Maxon due to him wanting to operate "on my legally blind eye";   Referring physician: Medicine, [facility] [place], [place] [address]  HISTORICAL INFORMATION:   Selected notes from the MEDICAL RECORD NUMBER Referred by Dr. Marylynn Pearson, Brooke Bonito for DM exam and surgical clearance  LEE: 06.13.2019 (R. Whitaker) [BCVA: OD: 20/400 OS: 20/40] Ocular Hx- Glaucoma, PDR, macular edema, Cataracts, 6 lasers for RD, 2 PPV OU, SLT OS Nasal OS, TIA, SLT Temporal OU PMH-Cardiac Arrest, DM (taking Novolog and Levemir), HTN    CURRENT MEDICATIONS: Current Outpatient Medications (Ophthalmic Drugs)  Medication Sig  . brimonidine-timolol (COMBIGAN) 0.2-0.5 % ophthalmic solution Place 1 drop into both eyes every 12 (twelve) hours.   . dorzolamide (TRUSOPT) 2 % ophthalmic solution Place 1 drop into both eyes 2 (two) times daily.    No current facility-administered medications for this visit.  (Ophthalmic Drugs)   Current Outpatient  Medications (Other)  Medication Sig  . Vitamin D, Ergocalciferol, (DRISDOL) 50000 units CAPS capsule Take by mouth.  Marland Kitchen albuterol (PROVENTIL HFA;VENTOLIN HFA) 108 (90 BASE) MCG/ACT inhaler Inhale 1-2 puffs into the lungs every 6 (six) hours as needed for wheezing or shortness of breath. (Patient taking differently: Inhale 1-2 puffs into the lungs every 6 (six) hours as needed for shortness of breath. )  . amLODipine (NORVASC) 10 MG tablet Take 10 mg by mouth daily.  Marland Kitchen atorvastatin (LIPITOR) 80 MG tablet Take 1 tablet (80 mg total) by mouth daily at 6 PM. (Patient taking differently: Take 80 mg by mouth daily. )  . cloNIDine (CATAPRES) 0.2 MG tablet Take 0.2 mg by mouth 2 (two) times daily.   . clopidogrel (PLAVIX) 75 MG tablet Take 1 tablet (75 mg total) by mouth daily.  Marland Kitchen FLUoxetine (PROZAC) 20 MG capsule Take 20 mg by mouth daily.  . furosemide (LASIX) 40 MG tablet Take 40 mg by mouth 2 (two) times daily.  Marland Kitchen gabapentin (NEURONTIN) 300 MG capsule   . HYDROcodone-acetaminophen (NORCO/VICODIN) 5-325 MG tablet Take one-two tabs po q 4-6 hrs prn pain (Patient not taking: Reported on 03/11/2018)  . insulin aspart (NOVOLOG) 100 UNIT/ML FlexPen Inject 2-10 Units into the skin 3 (three) times daily with meals. Sliding scale  . Insulin Detemir (LEVEMIR) 100 UNIT/ML Pen Inject 40 Units into the skin 2 (two)  times daily. (Patient taking differently: Inject 80 Units into the skin at bedtime. )  . losartan (COZAAR) 25 MG tablet Take 1 tablet (25 mg total) by mouth daily. Hold until follow up with PCP (Patient taking differently: Take 25 mg by mouth daily. )  . metoprolol succinate (TOPROL-XL) 25 MG 24 hr tablet Take 25 mg by mouth daily.  Marland Kitchen omeprazole (PRILOSEC) 20 MG capsule   . pantoprazole (PROTONIX) 40 MG tablet Take 1 tablet (40 mg total) by mouth daily. (Patient not taking: Reported on 03/11/2018)  . SYMBICORT 160-4.5 MCG/ACT inhaler   . tiotropium (SPIRIVA) 18 MCG inhalation capsule Place 18 mcg into  inhaler and inhale daily.  . traZODone (DESYREL) 100 MG tablet Take 150 mg by mouth at bedtime as needed for sleep.    No current facility-administered medications for this visit.  (Other)      REVIEW OF SYSTEMS: ROS    Positive for: Endocrine, Cardiovascular, Eyes, Respiratory, Psychiatric   Negative for: Constitutional, Gastrointestinal, Neurological, Skin, Genitourinary, Musculoskeletal, HENT, Allergic/Imm, Heme/Lymph   Last edited by Debbrah Alar, COT on 03/11/2018  9:43 AM. (History)       ALLERGIES Allergies  Allergen Reactions  . Contrast Media [Iodinated Diagnostic Agents] Anaphylaxis    "code blue--I died"  . Penicillins Rash    Has patient had a PCN reaction causing immediate rash, facial/tongue/throat swelling, SOB or lightheadedness with hypotension: Yes Has patient had a PCN reaction causing severe rash involving mucus membranes or skin necrosis: No Has patient had a PCN reaction that required hospitalization No Has patient had a PCN reaction occurring within the last 10 years: No If all of the above answers are "NO", then may proceed with Cephalosporin use.     PAST MEDICAL HISTORY Past Medical History:  Diagnosis Date  . Anemia   . Anxiety   . Arthritis    left arm  . Asystole ([place])    a. 05/2015: pt developed bradycardia->asystole during [place] nuclear stress test, s/p brief code. Cath with only 30% prox LCx. Her asystole during Lexiscan infusion was felt to represent an excessive pharmacologic response to the agent and likely superimposed vasovagal response.  . CKD (chronic kidney disease) stage 3, GFR 30-59 ml/min (HCC)    stage 4, not on dialysis  . COPD (chronic obstructive pulmonary disease) ([place])   . Depression   . Diabetes mellitus with diabetic retinopathy    type 2  . Difficulty walking   . Dyspnea   . Essential hypertension   . GERD (gastroesophageal reflux disease)   . History of blood transfusion   . History of stroke April 2016    Acute lacunar infarct of the left thalamus  . History of stroke   . Leg cramps   . Mild CAD    a. LHC 05/2015: 30% prox Cx, otherwise widely patent.  . Morbid obesity ([place])   . Neuropathy    both arms, feet  . PTSD (post-traumatic stress disorder)   . Seizures ([place])    has had one 12/26/15 - due to high blood sugar  . Stroke [facility])    right arm weakness, decrease feeling in fingers right hand  . TIA (transient ischemic attack)    Past Surgical History:  Procedure Laterality Date  . AV FISTULA PLACEMENT Right 01/08/2017   Procedure: RIGHT ARM BRACHIOCEPHALIC ARTERIOVENOUS (AV) FISTULA CREATION;  Surgeon: Waynetta Sandy, MD;  Location: [facility];  Service: Vascular;  Laterality: Right;  . CARDIAC CATHETERIZATION N/A 06/01/2015  Procedure: Left Heart Cath and Coronary Angiography;  Surgeon: Belva Crome, MD; CFX 30%, no other dz, EF nl by echo  . CESAREAN SECTION     x2  . DILATION AND CURETTAGE OF UTERUS    . EYE SURGERY     Right eye pars plano vitrectomy   . LASIK    . PARS PLANA VITRECTOMY  04/20/2011   Procedure: PARS PLANA VITRECTOMY WITH 25 GAUGE;  Surgeon: Hayden Pedro, MD;  Location: [facility];  Service: Ophthalmology;  Laterality: Left;  membrane peel, gas fluid exchange, endolaser, repair of complex retinal detachment  . TUBAL LIGATION      FAMILY HISTORY Family History  Problem Relation Age of Onset  . Diabetes Mother   . Kidney failure Mother   . Diabetes Father   . Amblyopia Neg Hx   . Blindness Neg Hx   . Cataracts Neg Hx   . Glaucoma Neg Hx   . Macular degeneration Neg Hx   . Retinal detachment Neg Hx   . Strabismus Neg Hx   . Retinitis pigmentosa Neg Hx     SOCIAL HISTORY Social History   Tobacco Use  . Smoking status: Current Every Day Smoker    Packs/day: 0.03    Years: 30.00    Pack years: 0.90    Types: Cigarettes    Start date: 06/05/1981    Last attempt to quit: 12/25/2016    Years since quitting: 1.2  . Smokeless tobacco: Never Used   Substance Use Topics  . Alcohol use: No    Alcohol/week: 0.0 standard drinks  . Drug use: No         OPHTHALMIC EXAM:  Base Eye Exam    Visual Acuity (Snellen - Linear)      Right Left   Dist [place] CF at 2' 20/70 +2   Dist ph [place] 20/150 +2 20/40 +2       Tonometry (Tonopen, 9:51 AM)      Right Left   Pressure 21 23       Pupils      Dark Light Shape React APD   Right 4 3.5 Round Minimal None   Left 4 3.5 Round Minimal None       Visual Fields (Counting fingers)      Left Right    Full Full       Extraocular Movement      Right Left    Full, Ortho Full, Ortho       Neuro/Psych    Oriented x3:  Yes   Mood/Affect:  Normal       Dilation    Both eyes:  1.0% Mydriacyl, 2.5% Phenylephrine @ 9:51 AM        Slit Lamp and Fundus Exam    Slit Lamp Exam      Right Left   Lids/Lashes Dermatochalasis - upper lid Dermatochalasis - upper lid   Conjunctiva/Sclera White and quiet White and quiet   Cornea Arcus, Decreased TBUT, Debris in tear film Arcus, Decreased TBUT, Debris in tear film   Anterior Chamber Deep and quiet Deep and quiet   Iris Round and dilated, No NVI Round and dilated, No NVI   Lens 2-3+ Nuclear sclerosis, 3+ Cortical cataract 2+ Nuclear sclerosis, 2+ Cortical cataract, +Vacuoles   Vitreous Post vitrectomy Post vitrectomy       Fundus Exam      Right Left   Disc inferior pigmented atrophy, Tilted disc cupping, mild Inferior Peripapillary atrophy  C/D Ratio 0.5 0.8   Macula Blunted fovea reflex, Epiretinal membrane with striae, fibrosis along inferior arcade, no edema Blunted foveal reflex, mild Epiretinal membrane, No heme or edema   Vessels Sclerosis of arterioles Vascular attenuation   Periphery Attached, round atrophc hole superior to disc, good 360 PRP laser in place, scattered fibrosis Attached, Mild focal fibrosis along arcades; 360 PRP          IMAGING AND PROCEDURES  Imaging and Procedures for @TODAY @  OCT, Retina - OU - Both Eyes        Right Eye Quality was good. Central Foveal Thickness: 258. Progression has been stable. Findings include abnormal foveal contour, no IRF, no SRF, retinal drusen , epiretinal membrane, outer retinal atrophy, inner retinal atrophy, macular pucker (Scattered cystic change).   Left Eye Quality was good. Central Foveal Thickness: 248. Progression has been stable. Findings include abnormal foveal contour, epiretinal membrane, no IRF, no SRF (stable pre-retinal fibrosis).   Notes *Images captured and stored on drive  Diagnosis / Impression:  No frank DME OU OD: No IRF/SRF, diffuse retinal atrophy, ERM OS: No IRF/SRF, ERM  Clinical management:  See below  Abbreviations: NFP - Normal foveal profile. CME - cystoid macular edema. PED - pigment epithelial detachment. IRF - intraretinal fluid. SRF - subretinal fluid. EZ - ellipsoid zone. ERM - epiretinal membrane. ORA - outer retinal atrophy. ORT - outer retinal tubulation. SRHM - subretinal hyper-reflective material                  ASSESSMENT/PLAN:    ICD-10-CM   1. Stable proliferative diabetic retinopathy of both eyes associated with type 2 diabetes mellitus (HCC) E11.3553 OCT, Retina - OU - Both Eyes  2. Retinal edema H35.81 OCT, Retina - OU - Both Eyes  3. Epiretinal membrane (ERM) of both eyes H35.373   4. Essential hypertension I10   5. Hypertensive retinopathy of both eyes H35.033   6. Primary open angle glaucoma (POAG) of both eyes, severe stage H40.1133   7. Combined form of age-related cataract, both eyes H25.813     1,2. Proliferative diabetic retinopathy w/o DME, OU - stable - s/p PPV and extensive PRP OU - The incidence, risk factors for progression, natural history and treatment options for diabetic retinopathy were discussed with patient.   - The need for close monitoring of blood glucose, blood pressure, and serum lipids, avoiding cigarette or any type of tobacco, and the need for long term follow up was also  discussed with patient. - exam shows scattered fibrosis but no frank NV - OCT without diabetic macular edema, OU - f/u 4 months - after cataract / glaucoma surgery  3. Epiretinal membrane, OU (OD > OS) The natural history, anatomy, potential for loss of vision, and treatment options including vitrectomy techniques and the complications of endophthalmitis, retinal detachment, vitreous hemorrhage, cataract progression and permanent vision loss discussed with the patient.  4,5. Hypertensive retinopathy OU - discussed importance of tight BP control - monitor  6. POAG severe stage OU-  - under the expert management of Dr. Venetia Maxon - was planning for CE w/ trab OD with Dr. Venetia Maxon, but pt held off - IOP elevated today -- pt reports missing drops recently  7. Combined form age-related cataract OU-  - The symptoms of cataract, surgical options, and treatments and risks were discussed with patient. - discussed diagnosis and progression and possibility of improved vision OD with surgery - likely visually significant - cleared from a retinal standpoint to  proceed with combination cataract and glaucoma surgery (CE + trab OD) - pt to reconsider surgery   Ophthalmic Meds Ordered this visit:  No orders of the defined types were placed in this encounter.      Return in about 4 months (around 07/12/2018) for F/U PDR OU, DFE, OCT.  There are no Patient Instructions on file for this visit.   This document serves as a record of services personally performed by Gardiner Sleeper, MD, PhD. It was created on their behalf by Ernest Mallick, OA, an ophthalmic assistant. The creation of this record is the provider's dictation and/or activities during the visit.    Electronically signed by: Ernest Mallick, OA  10.04.19 8:08 AM    Explained the diagnoses, plan, and follow up with the patient and they expressed understanding.  Patient expressed understanding of the importance of proper follow up care.   Gardiner Sleeper, M.D., Ph.D. Diseases & Surgery of the Retina and Vitreous [facility]   I have reviewed the above documentation for accuracy and completeness, and I agree with the above. Gardiner Sleeper, M.D., Ph.D. 03/13/18 8:11 AM    Abbreviations: M myopia (nearsighted); A astigmatism; H hyperopia (farsighted); P presbyopia; Mrx spectacle prescription;  CTL contact lenses; OD right eye; OS left eye; OU both eyes  XT exotropia; ET esotropia; PEK punctate epithelial keratitis; PEE punctate epithelial erosions; DES dry eye syndrome; MGD meibomian gland dysfunction; ATs artificial tears; PFAT's preservative free artificial tears; [place] nuclear sclerotic cataract; PSC posterior subcapsular cataract; ERM epi-retinal membrane; PVD posterior vitreous detachment; RD retinal detachment; DM diabetes mellitus; DR diabetic retinopathy; NPDR non-proliferative diabetic retinopathy; PDR proliferative diabetic retinopathy; CSME clinically significant macular edema; DME diabetic macular edema; dbh dot blot hemorrhages; CWS cotton wool spot; POAG primary open angle glaucoma; C/D cup-to-disc ratio; HVF humphrey visual field; GVF goldmann visual field; OCT optical coherence tomography; IOP intraocular pressure; BRVO Branch retinal vein occlusion; CRVO central retinal vein occlusion; CRAO central retinal artery occlusion; BRAO branch retinal artery occlusion; RT retinal tear; SB scleral buckle; PPV pars plana vitrectomy; VH Vitreous hemorrhage; PRP panretinal laser photocoagulation; IVK intravitreal kenalog; VMT vitreomacular traction; MH Macular hole;  NVD neovascularization of the disc; NVE neovascularization elsewhere; AREDS age related eye disease study; ARMD age related macular degeneration; POAG primary open angle glaucoma; EBMD epithelial/anterior basement membrane dystrophy; ACIOL anterior chamber intraocular lens; IOL intraocular lens; PCIOL posterior chamber intraocular lens; Phaco/IOL  phacoemulsification with intraocular lens placement; [place] photorefractive keratectomy; LASIK laser assisted in situ keratomileusis; HTN hypertension; DM diabetes mellitus; COPD chronic obstructive pulmonary disease

## 2018-03-11 ENCOUNTER — Encounter (INDEPENDENT_AMBULATORY_CARE_PROVIDER_SITE_OTHER): Payer: Self-pay | Admitting: Ophthalmology

## 2018-03-11 ENCOUNTER — Ambulatory Visit (INDEPENDENT_AMBULATORY_CARE_PROVIDER_SITE_OTHER): Payer: Medicare Other | Admitting: Ophthalmology

## 2018-03-11 DIAGNOSIS — H401133 Primary open-angle glaucoma, bilateral, severe stage: Secondary | ICD-10-CM

## 2018-03-11 DIAGNOSIS — H35033 Hypertensive retinopathy, bilateral: Secondary | ICD-10-CM

## 2018-03-11 DIAGNOSIS — I10 Essential (primary) hypertension: Secondary | ICD-10-CM

## 2018-03-11 DIAGNOSIS — H25813 Combined forms of age-related cataract, bilateral: Secondary | ICD-10-CM

## 2018-03-11 DIAGNOSIS — H3581 Retinal edema: Secondary | ICD-10-CM | POA: Diagnosis not present

## 2018-03-11 DIAGNOSIS — E113553 Type 2 diabetes mellitus with stable proliferative diabetic retinopathy, bilateral: Secondary | ICD-10-CM | POA: Diagnosis not present

## 2018-03-11 DIAGNOSIS — H35373 Puckering of macula, bilateral: Secondary | ICD-10-CM | POA: Diagnosis not present

## 2018-03-13 ENCOUNTER — Encounter (INDEPENDENT_AMBULATORY_CARE_PROVIDER_SITE_OTHER): Payer: Self-pay | Admitting: Ophthalmology

## 2018-07-15 ENCOUNTER — Encounter (INDEPENDENT_AMBULATORY_CARE_PROVIDER_SITE_OTHER): Payer: Medicare Other | Admitting: Ophthalmology

## 2018-07-16 ENCOUNTER — Ambulatory Visit: Payer: Medicare Other | Admitting: "Endocrinology

## 2018-07-30 ENCOUNTER — Ambulatory Visit (INDEPENDENT_AMBULATORY_CARE_PROVIDER_SITE_OTHER): Payer: Medicare HMO | Admitting: "Endocrinology

## 2018-07-30 ENCOUNTER — Encounter: Payer: Self-pay | Admitting: "Endocrinology

## 2018-07-30 VITALS — BP 131/77 | HR 56 | Ht 64.0 in | Wt 249.0 lb

## 2018-07-30 DIAGNOSIS — E1165 Type 2 diabetes mellitus with hyperglycemia: Secondary | ICD-10-CM

## 2018-07-30 DIAGNOSIS — E1122 Type 2 diabetes mellitus with diabetic chronic kidney disease: Secondary | ICD-10-CM

## 2018-07-30 DIAGNOSIS — IMO0002 Reserved for concepts with insufficient information to code with codable children: Secondary | ICD-10-CM

## 2018-07-30 DIAGNOSIS — N184 Chronic kidney disease, stage 4 (severe): Secondary | ICD-10-CM | POA: Diagnosis not present

## 2018-07-30 MED ORDER — INSULIN DETEMIR 100 UNIT/ML FLEXPEN: 50.0000 [IU] | PEN_INJECTOR | Freq: Every day | SUBCUTANEOUS | Status: DC

## 2018-07-30 MED ORDER — GLUCOSE BLOOD VI STRP: ORAL_STRIP | 2 refills | Status: DC

## 2018-07-30 NOTE — Progress Notes (Signed)
Endocrinology Consult Note       07/30/2018, 1:02 PM   Subjective:    Patient ID: Samantha Pierce, female    DOB: 12-Apr-1971.  Samantha Pierce is being seen in consultation for management of currently uncontrolled symptomatic diabetes requested by  Med[facility].   Past Medical History:  Diagnosis Date  . Anemia   . Anxiety   . Arthritis    left arm  . Asystole ([place])    a. 05/2015: pt developed bradycardia->asystole during [place] nuclear stress test, s/p brief code. Cath with only 30% prox LCx. Her asystole during Lexiscan infusion was felt to represent an excessive pharmacologic response to the Pierce and likely superimposed vasovagal response.  . CKD (chronic kidney disease) stage 3, GFR 30-59 ml/min (HCC)    stage 4, not on dialysis  . COPD (chronic obstructive pulmonary disease) ([place])   . Depression   . Diabetes mellitus with diabetic retinopathy    type 2  . Difficulty walking   . Dyspnea   . Essential hypertension   . GERD (gastroesophageal reflux disease)   . History of blood transfusion   . History of stroke April 2016   Acute lacunar infarct of the left thalamus  . History of stroke   . Leg cramps   . Mild CAD    a. LHC 05/2015: 30% prox Cx, otherwise widely patent.  . Morbid obesity ([place])   . Neuropathy    both arms, feet  . PTSD (post-traumatic stress disorder)   . Seizures ([place])    has had one 12/26/15 - due to high blood sugar  . Stroke [facility])    right arm weakness, decrease feeling in fingers right hand  . TIA (transient ischemic attack)    Past Surgical History:  Procedure Laterality Date  . AV FISTULA PLACEMENT Right 01/08/2017   Procedure: RIGHT ARM BRACHIOCEPHALIC ARTERIOVENOUS (AV) FISTULA CREATION;  Surgeon: Waynetta Sandy, MD;  Location: [place];  Service: Vascular;  Laterality: Right;  . CARDIAC CATHETERIZATION N/A 06/01/2015    Procedure: Left Heart Cath and Coronary Angiography;  Surgeon: Belva Crome, MD; CFX 30%, no other dz, EF nl by echo  . CESAREAN SECTION     x2  . DILATION AND CURETTAGE OF UTERUS    . EYE SURGERY     Right eye pars plano vitrectomy   . LASIK    . PARS PLANA VITRECTOMY  04/20/2011   Procedure: PARS PLANA VITRECTOMY WITH 25 GAUGE;  Surgeon: Hayden Pedro, MD;  Location: [place];  Service: Ophthalmology;  Laterality: Left;  membrane peel, gas fluid exchange, endolaser, repair of complex retinal detachment  . TUBAL LIGATION     Social History   Socioeconomic History  . Marital status: Single    Spouse name: Not on file  . Number of children: Not on file  . Years of education: Not on file  . Highest education level: Not on file  Occupational History  . Not on file  Social Needs  . Financial resource strain: Not on file  . Food insecurity:    Worry: Not on file  Inability: Not on file  . Transportation needs:    Medical: Not on file    Non-medical: Not on file  Tobacco Use  . Smoking status: Current Every Day Smoker    Packs/day: 0.03    Years: 30.00    Pack years: 0.90    Types: Cigarettes    Start date: 06/05/1981    Last attempt to quit: 12/25/2016    Years since quitting: 1.5  . Smokeless tobacco: Never Used  Substance and Sexual Activity  . Alcohol use: No    Alcohol/week: 0.0 standard drinks  . Drug use: No  . Sexual activity: Yes    Birth control/protection: Surgical  Lifestyle  . Physical activity:    Days per week: Not on file    Minutes per session: Not on file  . Stress: Not on file  Relationships  . Social connections:    Talks on phone: Not on file    Gets together: Not on file    Attends religious service: Not on file    Active member of club or organization: Not on file    Attends meetings of clubs or organizations: Not on file    Relationship status: Not on file  Other Topics Concern  . Not on file  Social History Narrative  . Not on file    Outpatient Encounter Medications as of 07/30/2018  Medication Sig  . albuterol (PROVENTIL HFA;VENTOLIN HFA) 108 (90 BASE) MCG/ACT inhaler Inhale 1-2 puffs into the lungs every 6 (six) hours as needed for wheezing or shortness of breath. (Patient taking differently: Inhale 1-2 puffs into the lungs every 6 (six) hours as needed for shortness of breath. )  . amLODipine (NORVASC) 10 MG tablet Take 10 mg by mouth daily.  . cloNIDine (CATAPRES) 0.2 MG tablet Take 0.2 mg by mouth 2 (two) times daily.   . clopidogrel (PLAVIX) 75 MG tablet Take 1 tablet (75 mg total) by mouth daily.  Marland Kitchen FLUoxetine (PROZAC) 20 MG capsule Take 20 mg by mouth daily.  . furosemide (LASIX) 40 MG tablet Take 40 mg by mouth 2 (two) times daily.  Marland Kitchen gabapentin (NEURONTIN) 300 MG capsule   . Insulin Detemir (LEVEMIR) 100 UNIT/ML Pen Inject 50 Units into the skin at bedtime.  Marland Kitchen losartan (COZAAR) 25 MG tablet Take 1 tablet (25 mg total) by mouth daily. Hold until follow up with PCP (Patient taking differently: Take 100 mg by mouth daily. Hold until follow up with PCP)  . metoprolol succinate (TOPROL-XL) 25 MG 24 hr tablet Take 50 mg by mouth daily.   Marland Kitchen omeprazole (PRILOSEC) 20 MG capsule   . SYMBICORT 160-4.5 MCG/ACT inhaler   . Tafluprost, PF, (ZIOPTAN) 0.0015 % SOLN Apply to eye.  . traZODone (DESYREL) 100 MG tablet Take 100 mg by mouth at bedtime.  . Vitamin D, Ergocalciferol, (DRISDOL) 50000 units CAPS capsule Take by mouth.  . [DISCONTINUED] Insulin Detemir (LEVEMIR) 100 UNIT/ML Pen Inject 40 Units into the skin 2 (two) times daily. (Patient taking differently: Inject 80 Units into the skin at bedtime. )  . atorvastatin (LIPITOR) 80 MG tablet Take 1 tablet (80 mg total) by mouth daily at 6 PM. (Patient taking differently: Take 80 mg by mouth daily. )  . brimonidine-timolol (COMBIGAN) 0.2-0.5 % ophthalmic solution Place 1 drop into both eyes every 12 (twelve) hours.   . dorzolamide (TRUSOPT) 2 % ophthalmic solution Place 1 drop  into both eyes 2 (two) times daily.   Marland Kitchen glucose blood (ACCU-CHEK GUIDE) test strip  Use as instructed  . HYDROcodone-acetaminophen (NORCO/VICODIN) 5-325 MG tablet Take one-two tabs po q 4-6 hrs prn pain (Patient not taking: Reported on 03/11/2018)  . insulin aspart (NOVOLOG) 100 UNIT/ML FlexPen Inject 10-16 Units into the skin 3 (three) times daily before meals.  . pantoprazole (PROTONIX) 40 MG tablet Take 1 tablet (40 mg total) by mouth daily. (Patient not taking: Reported on 03/11/2018)  . tiotropium (SPIRIVA) 18 MCG inhalation capsule Place 18 mcg into inhaler and inhale daily.  . traZODone (DESYREL) 100 MG tablet Take 150 mg by mouth at bedtime as needed for sleep.    No facility-administered encounter medications on file as of 07/30/2018.     ALLERGIES: Allergies  Allergen Reactions  . Contrast Media [Iodinated Diagnostic Agents] Anaphylaxis    "code blue--I died"  . Penicillins Rash    Has patient had a PCN reaction causing immediate rash, facial/tongue/throat swelling, SOB or lightheadedness with hypotension: Yes Has patient had a PCN reaction causing severe rash involving mucus membranes or skin necrosis: No Has patient had a PCN reaction that required hospitalization No Has patient had a PCN reaction occurring within the last 10 years: No If all of the above answers are "NO", then may proceed with Cephalosporin use.     VACCINATION STATUS: Immunization History  Administered Date(s) Administered  . Influenza,trivalent, recombinat, inj, PF 07/05/2016  . Pneumococcal Polysaccharide-23 09/05/2014    Diabetes  She presents for her initial diabetic visit. She has type 2 diabetes mellitus. Onset time: She was diagnosed at approximate age of 39 years. Her disease course has been worsening. There are no hypoglycemic associated symptoms. Pertinent negatives for hypoglycemia include no confusion, headaches, pallor or seizures. Associated symptoms include fatigue, polydipsia and polyuria.  Pertinent negatives for diabetes include no chest pain and no polyphagia. There are no hypoglycemic complications. Symptoms are worsening. Diabetic complications include a CVA, nephropathy and peripheral neuropathy. Risk factors for coronary artery disease include dyslipidemia, diabetes mellitus, hypertension, obesity, sedentary lifestyle, tobacco exposure and family history. Current diabetic treatment includes insulin injections. Her weight is fluctuating minimally. She is following a generally unhealthy diet. When asked about meal planning, she reported none. She never participates in exercise. (Not bring any logs nor meter to review.  She reports her most recent A1c was 13%.) An ACE inhibitor/angiotensin II receptor blocker is being taken.  Hyperlipidemia  This is a chronic problem. The current episode started more than 1 year ago. The problem is uncontrolled. Exacerbating diseases include chronic renal disease, diabetes and obesity. Pertinent negatives include no chest pain, myalgias or shortness of breath. Current antihyperlipidemic treatment includes statins. Risk factors for coronary artery disease include diabetes mellitus, dyslipidemia, hypertension and a sedentary lifestyle.  Hypertension  This is a chronic problem. The current episode started more than 1 year ago. Pertinent negatives include no chest pain, headaches, palpitations or shortness of breath. Risk factors for coronary artery disease include dyslipidemia, diabetes mellitus, obesity, sedentary lifestyle and smoking/tobacco exposure. Past treatments include angiotensin blockers. Hypertensive end-organ damage includes CVA. Identifiable causes of hypertension include chronic renal disease.      Review of Systems  Constitutional: Positive for fatigue. Negative for chills, fever and unexpected weight change.  HENT: Negative for trouble swallowing and voice change.   Eyes: Negative for visual disturbance.  Respiratory: Negative for cough,  shortness of breath and wheezing.   Cardiovascular: Negative for chest pain, palpitations and leg swelling.  Gastrointestinal: Negative for diarrhea, nausea and vomiting.  Endocrine: Positive for polydipsia and polyuria. Negative  for cold intolerance, heat intolerance and polyphagia.  Musculoskeletal: Negative for arthralgias and myalgias.  Skin: Negative for color change, pallor, rash and wound.  Neurological: Negative for seizures and headaches.  Psychiatric/Behavioral: Negative for confusion and suicidal ideas.    Objective:    BP 131/77   Pulse (!) 56   Ht 5\' 4"  (1.626 m)   Wt 249 lb (112.9 kg)   BMI 42.74 kg/m   Wt Readings from Last 3 Encounters:  07/30/18 249 lb (112.9 kg)  01/17/18 247 lb (112 kg)  12/13/17 252 lb (114.3 kg)     Physical Exam Constitutional:      Appearance: She is well-developed.  HENT:     Head: Normocephalic and atraumatic.  Neck:     Musculoskeletal: Normal range of motion and neck supple.     Thyroid: No thyromegaly.     Trachea: No tracheal deviation.  Cardiovascular:     Rate and Rhythm: Normal rate and regular rhythm.  Pulmonary:     Effort: Pulmonary effort is normal.     Breath sounds: Normal breath sounds.  Abdominal:     General: Bowel sounds are normal.     Palpations: Abdomen is soft.     Tenderness: There is no abdominal tenderness. There is no guarding.  Musculoskeletal: Normal range of motion.  Skin:    General: Skin is warm and dry.     Coloration: Skin is not pale.     Findings: No erythema or rash.  Neurological:     Mental Status: She is alert and oriented to person, place, and time.     Cranial Nerves: No cranial nerve deficit.     Coordination: Coordination normal.     Deep Tendon Reflexes: Reflexes are normal and symmetric.  Psychiatric:     Comments: Reluctant, unconcerned affect.       CMP ( most recent) CMP     Component Value Date/Time   NA 135 11/19/2017 0524   K 4.6 11/19/2017 0524   CL 104  11/19/2017 0524   CO2 24 11/19/2017 0524   GLUCOSE 279 (H) 11/19/2017 0524   BUN 36 (H) 11/19/2017 0524   CREATININE 3.36 (H) 11/19/2017 0524   CALCIUM 8.5 (L) 11/19/2017 0524   PROT 5.9 (L) 11/19/2017 0524   ALBUMIN 3.0 (L) 11/19/2017 0524   AST 19 11/19/2017 0524   ALT 18 11/19/2017 0524   ALKPHOS 45 11/19/2017 0524   BILITOT 0.4 11/19/2017 0524   GFRNONAA 15 (L) 11/19/2017 0524   GFRAA 18 (L) 11/19/2017 0524     Diabetic Labs (most recent): Lab Results  Component Value Date   HGBA1C 10.4 (H) 05/23/2015   HGBA1C 10.2 (H) 05/22/2015   HGBA1C 10.0 (H) 09/04/2014     Lipid Panel ( most recent) Lipid Panel     Component Value Date/Time   CHOL 245 (H) 05/23/2015 0545   TRIG 278 (H) 05/23/2015 0545   HDL 45 05/23/2015 0545   CHOLHDL 5.4 05/23/2015 0545   VLDL 56 (H) 05/23/2015 0545   LDLCALC 144 (H) 05/23/2015 0545      Lab Results  Component Value Date   TSH 1.524 09/04/2014           Assessment & Plan:   1. Uncontrolled diabetes mellitus with stage 4 chronic kidney disease ([place])  - Samantha Pierce has currently uncontrolled symptomatic type 2 DM since  48 years of age,  with most recent A1c of 13 %. Recent labs reviewed. - I had a  long discussion with her about the progressive nature of diabetes and the pathology behind its complications. -her diabetes is complicated by stage 3-4, CVA, obesity/sedentary life, chronic heavy smoking and she remains at a high risk for more acute and chronic complications which include CAD, CVA, CKD, retinopathy, and neuropathy. These are all discussed in detail with her.  - I have counseled her on diet management and weight loss, by adopting a carbohydrate restricted/protein rich diet. - she admits that there is a room for improvement in her food and drink choices. - Suggestion is made for her to avoid simple carbohydrates  from her diet including Cakes, Sweet Desserts, Ice Cream, Soda (diet and regular), Sweet Tea, Candies,  Chips, Cookies, Store Bought Juices, Alcohol in Excess of  1-2 drinks a day, Artificial Sweeteners,  Coffee Creamer, and "Sugar-free" Products. This will help patient to have more stable blood glucose profile and potentially avoid unintended weight gain.  - I encouraged her to switch to  unprocessed or minimally processed complex starch and increased protein intake (animal or plant source), fruits, and vegetables.  - she is advised to stick to a routine mealtimes to eat 3 meals  a day and avoid unnecessary snacks ( to snack only to correct hypoglycemia).   - she will be scheduled with Jearld Fenton, RDN, CDE for individualized diabetes education.  - I have approached her with the following individualized plan to manage diabetes and patient agrees:   -On her current and prevailing glycemic burden, she will continue to require intensive treatment with basal/bolus insulin in order for her to achieve and maintain control of diabetes to target. -She is advised to adjust her basal insulin Levemir to 50 units nightly, increase her NovoLog to 10 units 3 times a day with meals  for pre-meal BG readings of 90-150mg /dl, plus patient specific correction dose for unexpected hyperglycemia above 150mg /dl, associated with strict monitoring of glucose 4 times a day-before meals and at bedtime. - she is warned not to take insulin without proper monitoring per orders. - Adjustment parameters are given to her for hypo and hyperglycemia in writing. - she is encouraged to call clinic for blood glucose levels less than 70 or above 300 mg /dl. -Given her comorbidities, she will be treated exclusively with insulin for now.  Is not a candidate for metformin, SGLT2 inhibitors, nor incretin therapy.  - Patient specific target  A1c;  LDL, HDL, Triglycerides, and  Waist Circumference were discussed in detail.  2) Blood Pressure /Hypertension:  her blood pressure is  controlled to target.   she is advised to continue her  current medications including losartan 100 mg p.o. daily with breakfast . 3) Lipids/Hyperlipidemia:   Review of her recent lipid panel showed uncontrolled  LDL at 144 .  she  is advised to continue    atorvastatin 80 mg daily at bedtime.  Side effects and precautions discussed with her.  4)  Weight/Diet:  Body mass index is 42.74 kg/m.  -  clearly complicating her diabetes care.  I discussed with her the fact that loss of 5 - 10% of her  current body weight will have the most impact on her diabetes management.  CDE Consult will be initiated . Exercise, and detailed carbohydrates information provided  -  detailed on discharge instructions.  5) Chronic Care/Health Maintenance:  -she  is on ACEI/ARB and Statin medications and  is encouraged to initiate and continue to follow up with Ophthalmology, Dentist,  Podiatrist at least yearly or  according to recommendations, and advised to  Quit smoking. I have recommended yearly flu vaccine and pneumonia vaccine at least every 5 years; moderate intensity exercise for up to 150 minutes weekly; and  sleep for at least 7 hours a day.  - she is  advised to maintain close follow up with Medicine, [facility] for primary care needs, as well as her other providers for optimal and coordinated care.  - Time spent with the patient: 35 minutes, of which >50% was spent in obtaining information about her symptoms, reviewing her previous labs/studies, evaluations, and treatments, counseling her about her currently uncontrolled, complicated type 2 diabetes; hyperlipidemia, hypertension, and developing plans for long term treatment based on the latest standards of care/guidelines.    [name] participated in the discussions, expressed understanding, and voiced agreement with the above plans.  All questions were answered to her satisfaction. she is encouraged to contact clinic should she have any questions or concerns prior to her return visit.  Follow up  plan: - Return in about 1 week (around 08/06/2018) for Follow up with [facility] and Logs Only - no Labs.  Glade Lloyd, MD [facility] [address] [place], [place] [address] Phone: [phone]  Fax: [phone]    07/30/2018, 1:02 PM  This note was partially dictated with voice recognition software. Similar sounding words can be transcribed inadequately or may not  be corrected upon review.

## 2018-07-30 NOTE — Patient Instructions (Signed)

## 2018-08-06 ENCOUNTER — Ambulatory Visit: Payer: Medicare HMO | Admitting: "Endocrinology

## 2018-08-12 ENCOUNTER — Other Ambulatory Visit: Payer: Self-pay

## 2018-08-12 DIAGNOSIS — N184 Chronic kidney disease, stage 4 (severe): Secondary | ICD-10-CM

## 2018-08-16 ENCOUNTER — Encounter: Payer: Self-pay | Admitting: Vascular Surgery

## 2018-08-16 ENCOUNTER — Ambulatory Visit (INDEPENDENT_AMBULATORY_CARE_PROVIDER_SITE_OTHER): Payer: Medicare HMO | Admitting: Vascular Surgery

## 2018-08-16 ENCOUNTER — Other Ambulatory Visit: Payer: Self-pay

## 2018-08-16 ENCOUNTER — Encounter: Payer: Self-pay | Admitting: *Deleted

## 2018-08-16 ENCOUNTER — Other Ambulatory Visit: Payer: Self-pay | Admitting: *Deleted

## 2018-08-16 ENCOUNTER — Ambulatory Visit (HOSPITAL_COMMUNITY)
Admission: RE | Admit: 2018-08-16 | Discharge: 2018-08-16 | Disposition: A | Discharge status: Home / Self Care | Payer: Medicare HMO | Source: Ambulatory Visit | Attending: Vascular Surgery | Admitting: Vascular Surgery

## 2018-08-16 ENCOUNTER — Ambulatory Visit (INDEPENDENT_AMBULATORY_CARE_PROVIDER_SITE_OTHER)
Admission: RE | Admit: 2018-08-16 | Discharge: 2018-08-16 | Disposition: A | Discharge status: Home / Self Care | Payer: Medicare HMO | Source: Ambulatory Visit | Attending: Vascular Surgery | Admitting: Vascular Surgery

## 2018-08-16 VITALS — BP 160/92 | HR 65 | Temp 98.4°F | Resp 16 | Ht 64.0 in | Wt 245.0 lb

## 2018-08-16 DIAGNOSIS — N184 Chronic kidney disease, stage 4 (severe): Secondary | ICD-10-CM | POA: Insufficient documentation

## 2018-08-16 NOTE — Progress Notes (Signed)
Patient ID: Samantha Pierce, female   DOB: [birth year], 48 y.o.   MRN: [id number]  Reason for Consult: New Patient (Initial Visit) (eval right AVF)   Referred by [name], MD  Subjective:     HPI:  Samantha Pierce is a 48 y.o. female previous placement right brachiocephalic AV fistula.  This was over a year and a half ago.  She does have some right wrist pain that started after this as well as some right shoulder pains.  She is does not know about the timing related to her fistula.  Currently fistula appears thrombosed.  She is still chronic kidney disease but getting closer to dialysis.  Has not required dialysis yet.  States that she is lost about 50 pounds hopeful to get under 204 transplant.  Past Medical History:  Diagnosis Date  . Anemia   . Anxiety   . Arthritis    left arm  . Asystole ([place])    a. 05/2015: pt developed bradycardia->asystole during [place] nuclear stress test, s/p brief code. Cath with only 30% prox LCx. Her asystole during Lexiscan infusion was felt to represent an excessive pharmacologic response to the agent and likely superimposed vasovagal response.  . CKD (chronic kidney disease) stage 3, GFR 30-59 ml/min (HCC)    stage 4, not on dialysis  . COPD (chronic obstructive pulmonary disease) ([place])   . Depression   . Diabetes mellitus with diabetic retinopathy    type 2  . Difficulty walking   . Dyspnea   . Essential hypertension   . GERD (gastroesophageal reflux disease)   . History of blood transfusion   . History of stroke April 2016   Acute lacunar infarct of the left thalamus  . History of stroke   . Leg cramps   . Mild CAD    a. LHC 05/2015: 30% prox Cx, otherwise widely patent.  . Morbid obesity ([place])   . Neuropathy    both arms, feet  . PTSD (post-traumatic stress disorder)   . Seizures ([place])    has had one 12/26/15 - due to high blood sugar  . Stroke [facility])    right arm weakness, decrease feeling in fingers right hand  . TIA  (transient ischemic attack)    Family History  Problem Relation Age of Onset  . Diabetes Mother   . Kidney failure Mother   . Diabetes Father   . Amblyopia Neg Hx   . Blindness Neg Hx   . Cataracts Neg Hx   . Glaucoma Neg Hx   . Macular degeneration Neg Hx   . Retinal detachment Neg Hx   . Strabismus Neg Hx   . Retinitis pigmentosa Neg Hx    Past Surgical History:  Procedure Laterality Date  . AV FISTULA PLACEMENT Right 01/08/2017   Procedure: RIGHT ARM BRACHIOCEPHALIC ARTERIOVENOUS (AV) FISTULA CREATION;  Surgeon: Waynetta Sandy, MD;  Location: [place];  Service: Vascular;  Laterality: Right;  . CARDIAC CATHETERIZATION N/A 06/01/2015   Procedure: Left Heart Cath and Coronary Angiography;  Surgeon: Belva Crome, MD; CFX 30%, no other dz, EF nl by echo  . CESAREAN SECTION     x2  . DILATION AND CURETTAGE OF UTERUS    . EYE SURGERY     Right eye pars plano vitrectomy   . LASIK    . PARS PLANA VITRECTOMY  04/20/2011   Procedure: PARS PLANA VITRECTOMY WITH 25 GAUGE;  Surgeon: Hayden Pedro, MD;  Location: [place];  Service:  Ophthalmology;  Laterality: Left;  membrane peel, gas fluid exchange, endolaser, repair of complex retinal detachment  . TUBAL LIGATION      Short Social History:  Social History   Tobacco Use  . Smoking status: Current Every Day Smoker    Packs/day: 0.03    Years: 30.00    Pack years: 0.90    Types: Cigarettes    Start date: 06/05/1981    Last attempt to quit: 12/25/2016    Years since quitting: 1.6  . Smokeless tobacco: Never Used  Substance Use Topics  . Alcohol use: No    Alcohol/week: 0.0 standard drinks    Allergies  Allergen Reactions  . Contrast Media [Iodinated Diagnostic Agents] Anaphylaxis    "code blue--I died"  . Penicillins Rash    Has patient had a PCN reaction causing immediate rash, facial/tongue/throat swelling, SOB or lightheadedness with hypotension: Yes Has patient had a PCN reaction causing severe rash involving mucus  membranes or skin necrosis: No Has patient had a PCN reaction that required hospitalization No Has patient had a PCN reaction occurring within the last 10 years: No If all of the above answers are "NO", then may proceed with Cephalosporin use.     Current Outpatient Medications  Medication Sig Dispense Refill  . albuterol (PROVENTIL HFA;VENTOLIN HFA) 108 (90 BASE) MCG/ACT inhaler Inhale 1-2 puffs into the lungs every 6 (six) hours as needed for wheezing or shortness of breath. (Patient taking differently: Inhale 1-2 puffs into the lungs every 6 (six) hours as needed for shortness of breath. ) 1 Inhaler 0  . amLODipine (NORVASC) 10 MG tablet Take 10 mg by mouth daily.    Marland Kitchen atorvastatin (LIPITOR) 80 MG tablet Take 1 tablet (80 mg total) by mouth daily at 6 PM. (Patient taking differently: Take 80 mg by mouth daily. ) 30 tablet 6  . brimonidine-timolol (COMBIGAN) 0.2-0.5 % ophthalmic solution Place 1 drop into both eyes every 12 (twelve) hours.     . cloNIDine (CATAPRES) 0.2 MG tablet Take 0.2 mg by mouth 2 (two) times daily.     . clopidogrel (PLAVIX) 75 MG tablet Take 1 tablet (75 mg total) by mouth daily. 30 tablet 0  . dorzolamide (TRUSOPT) 2 % ophthalmic solution Place 1 drop into both eyes 2 (two) times daily.     Marland Kitchen FLUoxetine (PROZAC) 20 MG capsule Take 20 mg by mouth daily.    . furosemide (LASIX) 40 MG tablet Take 40 mg by mouth 2 (two) times daily.    Marland Kitchen gabapentin (NEURONTIN) 300 MG capsule     . glucose blood (ACCU-CHEK GUIDE) test strip Use as instructed 150 each 2  . insulin aspart (NOVOLOG) 100 UNIT/ML FlexPen Inject 10-16 Units into the skin 3 (three) times daily before meals.    . Insulin Detemir (LEVEMIR) 100 UNIT/ML Pen Inject 50 Units into the skin at bedtime. 15 mL   . losartan (COZAAR) 25 MG tablet Take 1 tablet (25 mg total) by mouth daily. Hold until follow up with PCP (Patient taking differently: Take 100 mg by mouth daily. Hold until follow up with PCP)    . metoprolol  succinate (TOPROL-XL) 25 MG 24 hr tablet Take 50 mg by mouth daily.     Marland Kitchen omeprazole (PRILOSEC) 20 MG capsule     . pantoprazole (PROTONIX) 40 MG tablet Take 1 tablet (40 mg total) by mouth daily. 30 tablet 1  . SYMBICORT 160-4.5 MCG/ACT inhaler     . Tafluprost, PF, (ZIOPTAN) 0.0015 % SOLN  Apply to eye.    . tiotropium (SPIRIVA) 18 MCG inhalation capsule Place 18 mcg into inhaler and inhale daily.    . traZODone (DESYREL) 100 MG tablet Take 150 mg by mouth at bedtime as needed for sleep.     . traZODone (DESYREL) 100 MG tablet Take 100 mg by mouth at bedtime.    . Vitamin D, Ergocalciferol, (DRISDOL) 50000 units CAPS capsule Take by mouth.    Marland Kitchen HYDROcodone-acetaminophen (NORCO/VICODIN) 5-325 MG tablet Take one-two tabs po q 4-6 hrs prn pain (Patient not taking: Reported on 08/16/2018) 10 tablet 0   No current facility-administered medications for this visit.     Review of Systems  Constitutional:  Constitutional negative. HENT: HENT negative.  Eyes: Eyes negative.  Respiratory: Respiratory negative.  Cardiovascular: Cardiovascular negative.  GI: Gastrointestinal negative.  Musculoskeletal:       Right arm pain Skin: Skin negative.  Neurological: Neurological negative. Hematologic: Hematologic/lymphatic negative.  Psychiatric: Psychiatric negative.        Objective:  Objective   Vitals:   08/16/18 0901  BP: (!) 160/92  Pulse: 65  Resp: 16  Temp: 98.4 F (36.9 C)  TempSrc: Oral  SpO2: 99%  Weight: 245 lb (111.1 kg)  Height: 5\' 4"  (1.626 m)   Body mass index is 42.05 kg/m.  Physical Exam HENT:     Head: Normocephalic.  Eyes:     Pupils: Pupils are equal, round, and reactive to light.  Neck:     Musculoskeletal: Neck supple.  Cardiovascular:     Rate and Rhythm: Normal rate.     Pulses:          Radial pulses are 2+ on the right side and 2+ on the left side.  Pulmonary:     Effort: Pulmonary effort is normal.  Abdominal:     General: Abdomen is flat.      Palpations: Abdomen is soft.  Musculoskeletal: Normal range of motion.        General: No swelling.  Skin:    General: Skin is warm and dry.     Capillary Refill: Capillary refill takes less than 2 seconds.  Neurological:     General: No focal deficit present.     Mental Status: She is alert.  Psychiatric:        Mood and Affect: Mood normal.        Behavior: Behavior normal.        Thought Content: Thought content normal.        Judgment: Judgment normal.     Data: I have independently interpreted her left upper extremity arterial duplex which demonstrates brachial artery 0.5 cm and triphasic waveforms   I have also independently interpreted her left upper extremity vein mapping which demonstrates cephalic vein 0.5 cm in the proximal forearm and a basilic vein 8.03 at the antecubital fossa.  I also used ultrasound to identify this as well appears to have suitable vein basilic at least for fistula creation.     Assessment/Plan:     48 year old female previous right brachiocephalic AV fistula creation as she is left-handed.  Appears to have basilic vein for fistula creation now on the left as the right is mostly thrombosed except at the anastomosis.  She does have right wrist pain as well as shoulder pain which I am unsure are related to the fistula but I would be happy to refer her to hand surgery for the wrist pain this time she is more interested in getting the fistula  placed.  I discussed with her this is likely a two-stage procedure given that basilic vein is the likely vein to be used although we will evaluate cephalic vein at the time of surgery.  She demonstrates good understanding we will get her scheduled today.     Waynetta Sandy MD [facility]

## 2018-08-21 ENCOUNTER — Telehealth: Payer: Self-pay | Admitting: Vascular Surgery

## 2018-08-22 ENCOUNTER — Telehealth: Payer: Self-pay | Admitting: Vascular Surgery

## 2018-08-22 NOTE — Telephone Encounter (Signed)
-----   Message from Waynetta Sandy, MD sent at 08/22/2018  7:48 AM EDT ----- Regarding: RE: AVF Creation Will not remove old fistula and cannot be reconstructed because it has clotted.   bc ----- Message ----- From: Kaleen Mask, LPN Sent: 8/88/2800   4:41 PM EDT To: Waynetta Sandy, MD Subject: AVF Creation                                   Hey again Dr. Donzetta Matters.  Ms. Savarino is having surgery 09/17/18.  She wants me to ask you 2 questions.  1- Will you be removing the old fistula in her right arm since you will be putting a new one in her left arm? 2- If you will not be removing the old fistula, could the veins somehow be reconstructed in her left arm so that she can continue to use it?  Please advise. Thanks again,  Thurston Hole., LPN

## 2018-08-22 NOTE — Telephone Encounter (Signed)
I called patient to relay Dr. Claretha Cooper message.  No answer.   Thurston Hole., LPN

## 2018-08-26 NOTE — Telephone Encounter (Signed)
Encounter opened in error

## 2018-08-28 ENCOUNTER — Ambulatory Visit: Payer: Medicare Other | Admitting: Nutrition

## 2018-08-28 ENCOUNTER — Telehealth: Payer: Self-pay | Admitting: Nutrition

## 2018-08-28 NOTE — Telephone Encounter (Signed)
Vm x 2 to call and reschedule missed phone assessment for DM.

## 2018-09-06 ENCOUNTER — Encounter: Payer: Self-pay | Admitting: Student

## 2018-09-06 ENCOUNTER — Telehealth (INDEPENDENT_AMBULATORY_CARE_PROVIDER_SITE_OTHER): Payer: Medicare HMO | Admitting: Student

## 2018-09-06 VITALS — BP 135/84 | Wt 245.0 lb

## 2018-09-06 DIAGNOSIS — Z79899 Other long term (current) drug therapy: Secondary | ICD-10-CM

## 2018-09-06 DIAGNOSIS — Z01810 Encounter for preprocedural cardiovascular examination: Secondary | ICD-10-CM | POA: Diagnosis not present

## 2018-09-06 DIAGNOSIS — I2510 Atherosclerotic heart disease of native coronary artery without angina pectoris: Secondary | ICD-10-CM | POA: Diagnosis not present

## 2018-09-06 DIAGNOSIS — I10 Essential (primary) hypertension: Secondary | ICD-10-CM | POA: Diagnosis not present

## 2018-09-06 DIAGNOSIS — Z7902 Long term (current) use of antithrombotics/antiplatelets: Secondary | ICD-10-CM

## 2018-09-06 DIAGNOSIS — N184 Chronic kidney disease, stage 4 (severe): Secondary | ICD-10-CM

## 2018-09-06 DIAGNOSIS — Z6841 Body Mass Index (BMI) 40.0 and over, adult: Secondary | ICD-10-CM

## 2018-09-06 NOTE — Progress Notes (Signed)

## 2018-09-06 NOTE — Progress Notes (Signed)
Virtual Visit via Telephone Note    Evaluation Performed:  Follow-up visit  This visit type was conducted due to national recommendations for restrictions regarding the COVID-19 Pandemic (e.g. social distancing).  This format is felt to be most appropriate for this patient at this time.  All issues noted in this document were discussed and addressed.  No physical exam was performed (except for noted visual exam findings with Video Visits).  Please refer to the patient's chart (MyChart message for video visits and phone note for telephone visits) for the patient's consent to telehealth for [facility].  Date:  09/06/2018   ID:  Samantha Pierce, DOB 10-Jan-1971, MRN [id number]  Patient Location:  [address] [place] [address]  Provider location:   [place], [place]  PCP:  Rosita Fire, MD  Cardiologist:  Rozann Lesches, MD Electrophysiologist:  None   Chief Complaint: Cardiac Clearance for Upcoming Surgery  History of Present Illness:    Samantha Pierce is a 48 y.o. female who presents via audio/video conferencing for a telehealth visit today. Past medical history includes CAD (s/p cath in 2016 following asystole with Lexiscan --> cath showed 30% Proximal-LAD stenosis but no obstructive CAD), HTN, Morbid Obesity, prior CVA's and Stage 4 CKD.   She was last examined by Dr. Domenic Polite in 01/2018 and reported having dyspnea on exertion at baseline but denied any changes in her symptoms or associated chest pain. Was continued on her current medication regimen at that time.   She has been evaluated by Dr. Donzetta Matters with Vascular Surgery in the interim with plans for right brachiocephalic AV fistula creation within the coming months and is being evaluated today for pre-operative cardiac clearance.    In talking with the patient today, she reports overall doing well from a cardiac perspective since her last office visit. She is not overly active at baseline due to her prior CVA and associated  balance issues following this but does walk 1-2 blocks per day and denies any chest pain with this. Has baseline dyspnea on exertion with no recent changes in her symptoms. Denies any orthopnea, PND, or edema.   Says she has lost 4 lbs in the past month due to dietary changes and is trying to reach a goal weight of under 200 lbs to increase her candidacy for a kidney transplant.   The patient does not have symptoms concerning for COVID-19 infection (fever, chills, cough, or new shortness of breath).    Prior CV studies:   The following studies were reviewed today:  Echocardiogram: 05/2015 Study Conclusions  - Left ventricle: The cavity size was normal. There was moderate   concentric hypertrophy. Systolic function was normal. The   estimated ejection fraction was in the range of 60% to 65%. Wall   motion was normal; there were no regional wall motion   abnormalities. Doppler parameters are consistent with abnormal   left ventricular relaxation (grade 1 diastolic dysfunction).   Doppler parameters are consistent with elevated ventricular   end-diastolic filling pressure. - Aortic valve: Trileaflet; normal thickness leaflets. There was   mild regurgitation. - Aortic root: The aortic root was normal in size. - Mitral valve: Structurally normal valve. There was trivial   regurgitation. - Right ventricle: The cavity size was normal. Wall thickness was   normal. Systolic function was normal. - Right atrium: The atrium was normal in size. - Tricuspid valve: Structurally normal valve. There was trivial   regurgitation. - Pulmonic valve: There was trivial regurgitation. -  Inferior vena cava: The vessel was normal in size. - Pericardium, extracardiac: A trivial pericardial effusion was   identified posterior to the heart. Features were not consistent   with tamponade physiology.  Cardiac Catheterization: 05/2015 1. Prox Cx lesion, 30% stenosed.    The coronary arteries are widely  patent with minimal plaque noted. 40 cc of contrast was used  Left ventricular end-diastolic pressure is mildly elevated. Left ventriculography was not performed in an attempt to minimize contrast exposure.  Asystole during adenosine infusion represents an excessive pharmacologic response to the agent and likely superimposed vasovagal response.  Recommendations:  Per treating team   Past Medical History:  Diagnosis Date  . Anemia   . Anxiety   . Arthritis    left arm  . Asystole ([place])    a. 05/2015: pt developed bradycardia->asystole during [place] nuclear stress test, s/p brief code. Cath with only 30% prox LCx. Her asystole during Lexiscan infusion was felt to represent an excessive pharmacologic response to the agent and likely superimposed vasovagal response.  . CKD (chronic kidney disease) stage 3, GFR 30-59 ml/min (HCC)    stage 4, not on dialysis  . COPD (chronic obstructive pulmonary disease) ([place])   . Depression   . Diabetes mellitus with diabetic retinopathy    type 2  . Difficulty walking   . Dyspnea   . Essential hypertension   . GERD (gastroesophageal reflux disease)   . History of blood transfusion   . History of stroke April 2016   Acute lacunar infarct of the left thalamus  . History of stroke   . Leg cramps   . Mild CAD    a. LHC 05/2015: 30% prox Cx, otherwise widely patent.  . Morbid obesity ([place])   . Neuropathy    both arms, feet  . PTSD (post-traumatic stress disorder)   . Seizures ([place])    has had one 12/26/15 - due to high blood sugar  . Stroke [facility])    right arm weakness, decrease feeling in fingers right hand  . TIA (transient ischemic attack)    Past Surgical History:  Procedure Laterality Date  . AV FISTULA PLACEMENT Right 01/08/2017   Procedure: RIGHT ARM BRACHIOCEPHALIC ARTERIOVENOUS (AV) FISTULA CREATION;  Surgeon: Waynetta Sandy, MD;  Location: [place];  Service: Vascular;  Laterality: Right;  . CARDIAC CATHETERIZATION N/A  06/01/2015   Procedure: Left Heart Cath and Coronary Angiography;  Surgeon: Belva Crome, MD; CFX 30%, no other dz, EF nl by echo  . CESAREAN SECTION     x2  . DILATION AND CURETTAGE OF UTERUS    . EYE SURGERY     Right eye pars plano vitrectomy   . LASIK    . PARS PLANA VITRECTOMY  04/20/2011   Procedure: PARS PLANA VITRECTOMY WITH 25 GAUGE;  Surgeon: Hayden Pedro, MD;  Location: [place];  Service: Ophthalmology;  Laterality: Left;  membrane peel, gas fluid exchange, endolaser, repair of complex retinal detachment  . TUBAL LIGATION       Current Meds  Medication Sig  . albuterol (PROVENTIL HFA;VENTOLIN HFA) 108 (90 BASE) MCG/ACT inhaler Inhale 1-2 puffs into the lungs every 6 (six) hours as needed for wheezing or shortness of breath. (Patient taking differently: Inhale 1-2 puffs into the lungs every 6 (six) hours as needed for shortness of breath. )  . amLODipine (NORVASC) 10 MG tablet Take 10 mg by mouth daily.  Marland Kitchen atorvastatin (LIPITOR) 80 MG tablet Take 1 tablet (80 mg total)  by mouth daily at 6 PM. (Patient taking differently: Take 80 mg by mouth daily. )  . brimonidine-timolol (COMBIGAN) 0.2-0.5 % ophthalmic solution Place 1 drop into both eyes every 12 (twelve) hours.   . cloNIDine (CATAPRES) 0.2 MG tablet Take 0.2 mg by mouth 2 (two) times daily.   . clopidogrel (PLAVIX) 75 MG tablet Take 1 tablet (75 mg total) by mouth daily.  . dorzolamide (TRUSOPT) 2 % ophthalmic solution Place 1 drop into both eyes 2 (two) times daily.   Marland Kitchen FLUoxetine (PROZAC) 20 MG capsule Take 20 mg by mouth daily.  . furosemide (LASIX) 40 MG tablet Take 40 mg by mouth 2 (two) times daily.  Marland Kitchen gabapentin (NEURONTIN) 300 MG capsule   . glucose blood (ACCU-CHEK GUIDE) test strip Use as instructed  . HYDROcodone-acetaminophen (NORCO/VICODIN) 5-325 MG tablet Take one-two tabs po q 4-6 hrs prn pain  . insulin aspart (NOVOLOG) 100 UNIT/ML FlexPen Inject 10-16 Units into the skin 3 (three) times daily before meals.   . Insulin Detemir (LEVEMIR) 100 UNIT/ML Pen Inject 50 Units into the skin at bedtime.  Marland Kitchen losartan (COZAAR) 25 MG tablet Take 1 tablet (25 mg total) by mouth daily. Hold until follow up with PCP (Patient taking differently: Take 100 mg by mouth daily. Hold until follow up with PCP)  . metoprolol succinate (TOPROL-XL) 25 MG 24 hr tablet Take 50 mg by mouth daily.   . pantoprazole (PROTONIX) 40 MG tablet Take 1 tablet (40 mg total) by mouth daily.  . SYMBICORT 160-4.5 MCG/ACT inhaler   . Tafluprost, PF, (ZIOPTAN) 0.0015 % SOLN Apply to eye.  . tiotropium (SPIRIVA) 18 MCG inhalation capsule Place 18 mcg into inhaler and inhale daily.  . traZODone (DESYREL) 100 MG tablet Take 150 mg by mouth at bedtime as needed for sleep.   . traZODone (DESYREL) 100 MG tablet Take 100 mg by mouth at bedtime.  . Vitamin D, Ergocalciferol, (DRISDOL) 50000 units CAPS capsule Take by mouth.     Allergies:   Contrast media [iodinated diagnostic agents] and Penicillins   Social History   Tobacco Use  . Smoking status: Current Every Day Smoker    Packs/day: 0.03    Years: 30.00    Pack years: 0.90    Types: Cigarettes    Start date: 06/05/1981    Last attempt to quit: 12/25/2016    Years since quitting: 1.6  . Smokeless tobacco: Never Used  Substance Use Topics  . Alcohol use: No    Alcohol/week: 0.0 standard drinks  . Drug use: No     Family Hx: The patient's family history includes Diabetes in her father and mother; Kidney failure in her mother. There is no history of Amblyopia, Blindness, Cataracts, Glaucoma, Macular degeneration, Retinal detachment, Strabismus, or Retinitis pigmentosa.  ROS:   Please see the history of present illness.     All other systems reviewed and are negative.   Labs/Other Tests and Data Reviewed:    Recent Labs: 11/19/2017: ALT 18; BUN 36; Creatinine, Ser 3.36; Hemoglobin 12.0; Platelets 262; Potassium 4.6; Sodium 135   Recent Lipid Panel Lab Results  Component Value  Date/Time   CHOL 245 (H) 05/23/2015 05:45 AM   TRIG 278 (H) 05/23/2015 05:45 AM   HDL 45 05/23/2015 05:45 AM   CHOLHDL 5.4 05/23/2015 05:45 AM   LDLCALC 144 (H) 05/23/2015 05:45 AM    Wt Readings from Last 3 Encounters:  09/06/18 245 lb (111.1 kg)  08/16/18 245 lb (111.1 kg)  07/30/18 249  lb (112.9 kg)     Objective:    Vital Signs:  BP 135/84   Wt 245 lb (111.1 kg)   BMI 42.05 kg/m    General: Pleasant female sounding in no acute distress.  Psych: Normal affect. Neuro: Alert and oriented X 3. Lungs: Breathing does not sound labored.   ASSESSMENT & PLAN:    1. Preoperative Cardiac Clearance for Fistula Placement - she has baseline dyspnea but denies any recent chest pain. Able to perform 4 METS of activity without anginal symptoms. She does have nonobstructive CAD by cath in 2016. - would not anticipate further cardiac testing prior to her planned surgery. She is of acceptable risk to proceed from a cardiac perspective. Of note, this has been delayed due to the current COVID-19 situation.   2. CAD - s/p catheterization in 2016 following asystole with Lexiscan --> cath showed 30% Proximal-LAD stenosis but no obstructive CAD.  - she has baseline dyspnea on exertion but denies any recent changes in this. No recent chest pain.  - continue statin and BB therapy. She is on Plavix from a Neurology perspective.   3. HTN - Patient reports BP has been well-controlled at home. Continue current medication regimen for now.   4. Stage 4 CKD - followed by Dr. Lowanda Foster. Creatinine stable at 2.98 when checked in 06/2018.  5. Morbid Obesity - BMI at 42. She is actively trying to lose weight and has lost 4 lbs in the past month. Congratulated on this with continued weight loss encouraged.     COVID-19 Education: The signs and symptoms of COVID-19 were discussed with the patient and how to seek care for testing (follow up with PCP or arrange E-visit).  The importance of social distancing  was discussed today.  Patient Risk:   After full review of this patient's clinical status, I feel that they are at least moderate risk at this time.  Time:   Today, I have spent 20 minutes with the patient with telehealth technology discussing the above cardiac issues.     Medication Adjustments/Labs and Tests Ordered: Current medicines are reviewed at length with the patient today.  Concerns regarding medicines are outlined above.  Tests Ordered: No orders of the defined types were placed in this encounter.  Medication Changes: No orders of the defined types were placed in this encounter.   Disposition:  Follow up with Dr. Domenic Polite in 6 months.   Signed, Erma Heritage, PA-C  09/06/2018 4:23 PM    [facility]

## 2018-09-17 ENCOUNTER — Ambulatory Visit: Admit: 2018-09-17 | Payer: Medicare HMO | Admitting: Vascular Surgery

## 2018-09-17 SURGERY — ARTERIOVENOUS (AV) FISTULA CREATION
Anesthesia: Monitor Anesthesia Care | Laterality: Left

## 2018-10-24 ENCOUNTER — Other Ambulatory Visit: Payer: Self-pay

## 2018-11-16 ENCOUNTER — Other Ambulatory Visit (HOSPITAL_COMMUNITY)
Admission: RE | Admit: 2018-11-16 | Discharge: 2018-11-16 | Disposition: A | Discharge status: Home / Self Care | Payer: Medicare HMO | Source: Ambulatory Visit | Attending: Vascular Surgery | Admitting: Vascular Surgery

## 2018-11-16 DIAGNOSIS — Z1159 Encounter for screening for other viral diseases: Secondary | ICD-10-CM | POA: Insufficient documentation

## 2018-11-17 LAB — NOVEL CORONAVIRUS, NAA (HOSP ORDER, SEND-OUT TO REF LAB; TAT 18-24 HRS): SARS-CoV-2, NAA: NOT DETECTED

## 2018-11-19 ENCOUNTER — Other Ambulatory Visit: Payer: Self-pay

## 2018-11-19 ENCOUNTER — Encounter (HOSPITAL_COMMUNITY): Payer: Self-pay | Admitting: *Deleted

## 2018-11-19 MED ORDER — VANCOMYCIN HCL 10 G IV SOLR
1500.0000 mg | INTRAVENOUS | Status: AC
  Administered 2018-11-20: 1500 mg via INTRAVENOUS
  Filled 2018-11-19: qty 1500

## 2018-11-19 NOTE — Progress Notes (Signed)
Pt denies having SOB and chest pain. Pt stated that she is under the care of Dr. Domenic Polite, Cardiology. Pt denies having a chest x ray within the last year. Pt made aware to stop taking vitamins, fish oil and herbal medications. Do not take any NSAIDs ie: Ibuprofen, Advil, Naproxen (Aleve), Motrin, BC and Goody Powder. Pt made aware to take 35 units of Levemir insulin tonight. Pt made aware to check BG every 2 hours prior to arrival to hospital on DOS. Pt made aware to treat a BG < 70 with  4 ounces of apple or cranberry juice, wait 15 minutes after intervention to recheck BG, if BG remains < 70, call Short Stay unit to speak with a nurse.  Pt denies that she and family members tested positive for COVID-19 ( pt had negative COVID -19 test on 11/16/2018 and reminded to quarantine).   Pt denies that she and family members experienced the following symptoms:  Cough yes/no: No Fever (>100.22F)  yes/no: No Runny nose yes/no: No Sore throat yes/no: No Difficulty breathing/shortness of breath  yes/no: No  Have you or a family member traveled in the last 14 days and where? yes/no: No   Pt reminded that hospital visitation restrictions are in effect and the importance of the restrictions.   Pt verbalized understanding of all pre-op instructions.  PA, Anesthesiology, asked to review pt history; see note.

## 2018-11-19 NOTE — Anesthesia Preprocedure Evaluation (Addendum)
Anesthesia Evaluation  Patient identified by MRN, date of birth, ID band Patient awake    Reviewed: Allergy & Precautions, NPO status , Patient's Chart, lab work & pertinent test results, reviewed documented beta blocker date and time   History of Anesthesia Complications Negative for: history of anesthetic complications  Airway Mallampati: II  TM Distance: >3 FB Neck ROM: Full    Dental  (+) Teeth Intact, Dental Advisory Given   Pulmonary COPD,  COPD inhaler, Current Smoker,    Pulmonary exam normal        Cardiovascular hypertension, Pt. on medications and Pt. on home beta blockers + CAD  Normal cardiovascular exam  Cardiac cath 06/01/15: prox Cx lesion, 30% stenosed, no interventions  Echo 05/23/15: moderate concentric hypertrophy, EF 40-98%, grade 1 diastolic dysfunction, mild AVR    Neuro/Psych Seizures - (x1 in 2017),  Anxiety Depression Bipolar Disorder CVA (09/2014, right arm weakness), Residual Symptoms    GI/Hepatic Neg liver ROS, GERD  Medicated and Controlled,  Endo/Other  diabetes, Poorly Controlled, Type 2, Insulin DependentMorbid obesity  Renal/GU Renal Insufficiency and ESRFRenal disease     Musculoskeletal  (+) Arthritis ,   Abdominal   Peds  Hematology negative hematology ROS (+)   Anesthesia Other Findings Day of surgery medications reviewed with the patient.  Reproductive/Obstetrics                         Anesthesia Physical Anesthesia Plan  ASA: III  Anesthesia Plan: MAC   Post-op Pain Management:    Induction:   PONV Risk Score and Plan: Treatment may vary due to age or medical condition and Propofol infusion  Airway Management Planned: Natural Airway and Nasal Cannula  Additional Equipment:   Intra-op Plan:   Post-operative Plan:   Informed Consent: I have reviewed the patients History and Physical, chart, labs and discussed the procedure including the  risks, benefits and alternatives for the proposed anesthesia with the patient or authorized representative who has indicated his/her understanding and acceptance.     Dental advisory given  Plan Discussed with: CRNA  Anesthesia Plan Comments: (PAT note written 11/19/2018 by Myra Gianotti, PA-C. )      Anesthesia Quick Evaluation

## 2018-11-19 NOTE — Progress Notes (Signed)
Anesthesia Chart Review: SAME DAY WORK-UP   Case: [id number] Date/Time: 11/20/18 0816   Procedure: LEFT ARM ARTERIOVENOUS (AV) FISTULA CREATION VS ARTERIOVENOUS GRAFT CREATION (Left )   Anesthesia type: Monitor Anesthesia Care   Pre-op diagnosis: CHRONIC KIDNEY STAGE 4   Location: [facility] ROOM 11 / [facility]   Surgeon: Waynetta Sandy, MD      DISCUSSION: Patient is a 48 year old female scheduled for the above procedure.  History includes smoking, HTN, COPD, anemia, CVA (09/04/14), morbid obesity, dyspnea, DM2, PTSD, Bipolar disorder, seizure (related to uncontrolled HTN/DM), GERD, gait disturbance, CKD stage IV, non-obstructive CAD (05/2015).  Of note, on 06/01/15 she developed bradycardia followed by asystole during a Lexiscan nuclear stress test requiring brief CODE. Cardiac cath showed only 30% LCX disease, so response felt likely "excessive pharmacologic response to the agent and likely superimposed vasovagal response."  Last cardiology evaluation was on 09/06/18 by Bernerd Pho, PA-C. She was seen for preoperative cardiac clearance and wrote,  "- she has baseline dyspnea but denies any recent chest pain. Able to perform 4 METS of activity without anginal symptoms. She does have nonobstructive CAD by cath in 2016. - would not anticipate further cardiac testing prior to her planned surgery. She is of acceptable risk to proceed from a cardiac perspective."  Patient to continue Plavix per [facility].  11/16/18 COVID test was negative. She is for labs on arrival.   PROVIDERS: Rosita Fire, MD is PCP Rozann Lesches, MD is cardiologist [name], MD is nephrologist   LABS: She is for updated labs on the day of surgery.    EKG: 01/17/18: SB at 58 bpm   CV: Cardiac cath 06/01/15: 1. Prox Cx lesion, 30% stenosed.  The coronary arteries are widely patent with minimal plaque noted. 40 cc of contrast was used  Left ventricular end-diastolic pressure is mildly elevated. Left  ventriculography was not performed in an attempt to minimize contrast exposure.  Asystole during adenosine infusion represents an excessive pharmacologic response to the agent and likely superimposed vasovagal response.  Echo 05/23/15: Study Conclusions - Left ventricle: The cavity size was normal. There was moderate   concentric hypertrophy. Systolic function was normal. The   estimated ejection fraction was in the range of 60% to 65%. Wall   motion was normal; there were no regional wall motion   abnormalities. Doppler parameters are consistent with abnormal   left ventricular relaxation (grade 1 diastolic dysfunction).   Doppler parameters are consistent with elevated ventricular   end-diastolic filling pressure. - Aortic valve: Trileaflet; normal thickness leaflets. There was   mild regurgitation. - Aortic root: The aortic root was normal in size. - Mitral valve: Structurally normal valve. There was trivial   regurgitation. - Right ventricle: The cavity size was normal. Wall thickness was   normal. Systolic function was normal. - Right atrium: The atrium was normal in size. - Tricuspid valve: Structurally normal valve. There was trivial   regurgitation. - Pulmonic valve: There was trivial regurgitation. - Inferior vena cava: The vessel was normal in size. - Pericardium, extracardiac: A trivial pericardial effusion was   identified posterior to the heart. Features were not consistent   with tamponade physiology.  Carotid US 05/23/15: Summary: Intimal wall thickening CCA. Mild mixed plaque origin ICA. 1-39% ICA plaquing. Vertebral artery flow is antegrade.   Past Medical History:  Diagnosis Date  . Anemia   . Anxiety   . Arthritis    left arm  . Asystole ([place])  a. 05/2015: pt developed bradycardia->asystole during [place] nuclear stress test, s/p brief code. Cath with only 30% prox LCx. Her asystole during Lexiscan infusion was felt to represent an excessive  pharmacologic response to the agent and likely superimposed vasovagal response.  . Bipolar disorder ([place])   . CKD (chronic kidney disease) stage 3, GFR 30-59 ml/min (HCC)    stage 4, not on dialysis  . COPD (chronic obstructive pulmonary disease) ([place])   . Depression   . Diabetes mellitus with diabetic retinopathy    type 2  . Difficulty walking   . Dyspnea   . Essential hypertension   . GERD (gastroesophageal reflux disease)   . History of blood transfusion   . History of stroke April 2016   Acute lacunar infarct of the left thalamus  . History of stroke   . Leg cramps   . Mild CAD    a. LHC 05/2015: 30% prox Cx, otherwise widely patent.  . Morbid obesity ([place])   . Neuropathy    both arms, feet  . PTSD (post-traumatic stress disorder)   . PTSD (post-traumatic stress disorder)   . Seizures ([place])    has had one 12/26/15 - due to high blood sugar  . Stroke [facility])    right arm weakness, decrease feeling in fingers right hand  . TIA (transient ischemic attack)     Past Surgical History:  Procedure Laterality Date  . AV FISTULA PLACEMENT Right 01/08/2017   Procedure: RIGHT ARM BRACHIOCEPHALIC ARTERIOVENOUS (AV) FISTULA CREATION;  Surgeon: Waynetta Sandy, MD;  Location: [facility];  Service: Vascular;  Laterality: Right;  . CARDIAC CATHETERIZATION N/A 06/01/2015   Procedure: Left Heart Cath and Coronary Angiography;  Surgeon: Belva Crome, MD; CFX 30%, no other dz, EF nl by echo  . CESAREAN SECTION     x2  . DILATION AND CURETTAGE OF UTERUS    . EYE SURGERY     Right eye pars plano vitrectomy   . LASIK    . PARS PLANA VITRECTOMY  04/20/2011   Procedure: PARS PLANA VITRECTOMY WITH 25 GAUGE;  Surgeon: Hayden Pedro, MD;  Location: [facility];  Service: Ophthalmology;  Laterality: Left;  membrane peel, gas fluid exchange, endolaser, repair of complex retinal detachment  . TUBAL LIGATION      MEDICATIONS: . [START ON 11/20/2018] vancomycin (VANCOCIN) 1,500 mg in sodium chloride  0.9 % 500 mL IVPB   . albuterol (PROVENTIL HFA;VENTOLIN HFA) 108 (90 BASE) MCG/ACT inhaler  . amLODipine (NORVASC) 10 MG tablet  . atorvastatin (LIPITOR) 80 MG tablet  . brimonidine-timolol (COMBIGAN) 0.2-0.5 % ophthalmic solution  . cloNIDine (CATAPRES) 0.2 MG tablet  . clopidogrel (PLAVIX) 75 MG tablet  . dorzolamide (TRUSOPT) 2 % ophthalmic solution  . FLUoxetine (PROZAC) 20 MG capsule  . furosemide (LASIX) 40 MG tablet  . gabapentin (NEURONTIN) 300 MG capsule  . insulin aspart (NOVOLOG) 100 UNIT/ML FlexPen  . Insulin Detemir (LEVEMIR) 100 UNIT/ML Pen  . losartan (COZAAR) 100 MG tablet  . metoprolol succinate (TOPROL-XL) 50 MG 24 hr tablet  . omeprazole (PRILOSEC) 20 MG capsule  . SYMBICORT 160-4.5 MCG/ACT inhaler  . Tafluprost, PF, (ZIOPTAN) 0.0015 % SOLN  . tiotropium (SPIRIVA) 18 MCG inhalation capsule  . traZODone (DESYREL) 100 MG tablet  . Vitamin D, Ergocalciferol, (DRISDOL) 50000 units CAPS capsule  . glucose blood (ACCU-CHEK GUIDE) test strip    Myra Gianotti, PA-C Surgical Short Stay/Anesthesiology [facility] Phone [phone] [facility] Phone [phone] 11/19/2018 10:38 AM

## 2018-11-20 ENCOUNTER — Ambulatory Visit (HOSPITAL_COMMUNITY): Payer: Medicare HMO | Admitting: Vascular Surgery

## 2018-11-20 ENCOUNTER — Encounter (HOSPITAL_COMMUNITY)
Admission: RE | Disposition: A | Discharge status: Home / Self Care | Payer: Self-pay | Source: Home / Self Care | Attending: Vascular Surgery

## 2018-11-20 ENCOUNTER — Ambulatory Visit (HOSPITAL_COMMUNITY)
Admission: RE | Admit: 2018-11-20 | Discharge: 2018-11-20 | Disposition: A | Discharge status: Home / Self Care | Payer: Medicare HMO | Attending: Vascular Surgery | Admitting: Vascular Surgery

## 2018-11-20 ENCOUNTER — Encounter (HOSPITAL_COMMUNITY): Payer: Self-pay | Admitting: *Deleted

## 2018-11-20 DIAGNOSIS — Z794 Long term (current) use of insulin: Secondary | ICD-10-CM | POA: Diagnosis not present

## 2018-11-20 DIAGNOSIS — F419 Anxiety disorder, unspecified: Secondary | ICD-10-CM | POA: Diagnosis not present

## 2018-11-20 DIAGNOSIS — I1310 Hypertensive heart and chronic kidney disease without heart failure, with stage 1 through stage 4 chronic kidney disease, or unspecified chronic kidney disease: Secondary | ICD-10-CM | POA: Diagnosis present

## 2018-11-20 DIAGNOSIS — E1142 Type 2 diabetes mellitus with diabetic polyneuropathy: Secondary | ICD-10-CM | POA: Insufficient documentation

## 2018-11-20 DIAGNOSIS — F17210 Nicotine dependence, cigarettes, uncomplicated: Secondary | ICD-10-CM | POA: Insufficient documentation

## 2018-11-20 DIAGNOSIS — J449 Chronic obstructive pulmonary disease, unspecified: Secondary | ICD-10-CM | POA: Diagnosis not present

## 2018-11-20 DIAGNOSIS — E11319 Type 2 diabetes mellitus with unspecified diabetic retinopathy without macular edema: Secondary | ICD-10-CM | POA: Diagnosis not present

## 2018-11-20 DIAGNOSIS — M1990 Unspecified osteoarthritis, unspecified site: Secondary | ICD-10-CM | POA: Insufficient documentation

## 2018-11-20 DIAGNOSIS — I2510 Atherosclerotic heart disease of native coronary artery without angina pectoris: Secondary | ICD-10-CM | POA: Diagnosis not present

## 2018-11-20 DIAGNOSIS — Z79899 Other long term (current) drug therapy: Secondary | ICD-10-CM | POA: Insufficient documentation

## 2018-11-20 DIAGNOSIS — K219 Gastro-esophageal reflux disease without esophagitis: Secondary | ICD-10-CM | POA: Diagnosis not present

## 2018-11-20 DIAGNOSIS — F4310 Post-traumatic stress disorder, unspecified: Secondary | ICD-10-CM | POA: Insufficient documentation

## 2018-11-20 DIAGNOSIS — D649 Anemia, unspecified: Secondary | ICD-10-CM | POA: Diagnosis not present

## 2018-11-20 DIAGNOSIS — F329 Major depressive disorder, single episode, unspecified: Secondary | ICD-10-CM | POA: Diagnosis not present

## 2018-11-20 DIAGNOSIS — N185 Chronic kidney disease, stage 5: Secondary | ICD-10-CM

## 2018-11-20 DIAGNOSIS — I69833 Monoplegia of upper limb following other cerebrovascular disease affecting right non-dominant side: Secondary | ICD-10-CM | POA: Insufficient documentation

## 2018-11-20 DIAGNOSIS — Z6841 Body Mass Index (BMI) 40.0 and over, adult: Secondary | ICD-10-CM | POA: Insufficient documentation

## 2018-11-20 HISTORY — DX: Bipolar disorder, unspecified: F31.9

## 2018-11-20 HISTORY — PX: AV FISTULA PLACEMENT: SHX1204

## 2018-11-20 LAB — POCT I-STAT 4, (NA,K, GLUC, HGB,HCT)
Glucose, Bld: 126 mg/dL — ABNORMAL HIGH (ref 70–99)
HCT: 39 % (ref 36.0–46.0)
Hemoglobin: 13.3 g/dL (ref 12.0–15.0)
Potassium: 4.2 mmol/L (ref 3.5–5.1)
Sodium: 135 mmol/L (ref 135–145)

## 2018-11-20 LAB — GLUCOSE, CAPILLARY: Glucose-Capillary: 105 mg/dL — ABNORMAL HIGH (ref 70–99)

## 2018-11-20 LAB — POCT PREGNANCY, URINE: Preg Test, Ur: NEGATIVE

## 2018-11-20 SURGERY — ARTERIOVENOUS (AV) FISTULA CREATION
Anesthesia: Monitor Anesthesia Care | Site: Arm Upper | Laterality: Left

## 2018-11-20 MED ORDER — LIDOCAINE 2% (20 MG/ML) 5 ML SYRINGE
INTRAMUSCULAR | Status: AC
  Filled 2018-11-20: qty 5

## 2018-11-20 MED ORDER — OXYCODONE HCL 5 MG PO TABS: 5.0000 mg | ORAL_TABLET | Freq: Once | ORAL | Status: DC | PRN

## 2018-11-20 MED ORDER — FENTANYL CITRATE (PF) 100 MCG/2ML IJ SOLN
INTRAMUSCULAR | Status: DC | PRN
  Administered 2018-11-20 (×2): 25 ug via INTRAVENOUS

## 2018-11-20 MED ORDER — OXYCODONE-ACETAMINOPHEN 5-325 MG PO TABS: 1.0000 | ORAL_TABLET | Freq: Four times a day (QID) | ORAL | 0 refills | Status: DC | PRN

## 2018-11-20 MED ORDER — LIDOCAINE-EPINEPHRINE (PF) 1 %-1:200000 IJ SOLN
INTRAMUSCULAR | Status: DC | PRN
  Administered 2018-11-20: 10 mL

## 2018-11-20 MED ORDER — FENTANYL CITRATE (PF) 250 MCG/5ML IJ SOLN
INTRAMUSCULAR | Status: AC
  Filled 2018-11-20: qty 5

## 2018-11-20 MED ORDER — LIDOCAINE 2% (20 MG/ML) 5 ML SYRINGE
INTRAMUSCULAR | Status: DC | PRN
  Administered 2018-11-20: 40 mg via INTRAVENOUS

## 2018-11-20 MED ORDER — CHLORHEXIDINE GLUCONATE 4 % EX LIQD: 60.0000 mL | Freq: Once | CUTANEOUS | Status: DC

## 2018-11-20 MED ORDER — ACETAMINOPHEN 10 MG/ML IV SOLN: 1000.0000 mg | Freq: Once | INTRAVENOUS | Status: DC | PRN

## 2018-11-20 MED ORDER — 0.9 % SODIUM CHLORIDE (POUR BTL) OPTIME
TOPICAL | Status: DC | PRN
  Administered 2018-11-20: 1000 mL

## 2018-11-20 MED ORDER — PROPOFOL 500 MG/50ML IV EMUL
INTRAVENOUS | Status: DC | PRN
  Administered 2018-11-20: 75 ug/kg/min via INTRAVENOUS

## 2018-11-20 MED ORDER — SODIUM CHLORIDE 0.9 % IV SOLN
INTRAVENOUS | Status: AC
  Filled 2018-11-20: qty 1.2

## 2018-11-20 MED ORDER — FENTANYL CITRATE (PF) 100 MCG/2ML IJ SOLN: 25.0000 ug | INTRAMUSCULAR | Status: DC | PRN

## 2018-11-20 MED ORDER — SODIUM CHLORIDE 0.9 % IV SOLN
INTRAVENOUS | Status: DC
  Administered 2018-11-20: 08:00:00 via INTRAVENOUS

## 2018-11-20 MED ORDER — MIDAZOLAM HCL 2 MG/2ML IJ SOLN
INTRAMUSCULAR | Status: DC | PRN
  Administered 2018-11-20: 2 mg via INTRAVENOUS

## 2018-11-20 MED ORDER — PROMETHAZINE HCL 25 MG/ML IJ SOLN: 6.2500 mg | INTRAMUSCULAR | Status: DC | PRN

## 2018-11-20 MED ORDER — MIDAZOLAM HCL 2 MG/2ML IJ SOLN
INTRAMUSCULAR | Status: AC
  Filled 2018-11-20: qty 2

## 2018-11-20 MED ORDER — SODIUM CHLORIDE 0.9 % IV SOLN
INTRAVENOUS | Status: DC | PRN
  Administered 2018-11-20: 500 mL

## 2018-11-20 MED ORDER — OXYCODONE HCL 5 MG/5ML PO SOLN: 5.0000 mg | Freq: Once | ORAL | Status: DC | PRN

## 2018-11-20 MED ORDER — LIDOCAINE-EPINEPHRINE (PF) 1 %-1:200000 IJ SOLN
INTRAMUSCULAR | Status: AC
  Filled 2018-11-20: qty 30

## 2018-11-20 MED ORDER — PROPOFOL 10 MG/ML IV BOLUS
INTRAVENOUS | Status: AC
  Filled 2018-11-20: qty 20

## 2018-11-20 SURGICAL SUPPLY — 34 items
ARMBAND PINK RESTRICT EXTREMIT (MISCELLANEOUS) ×2 IMPLANT
CANISTER SUCT 3000ML PPV (MISCELLANEOUS) ×2 IMPLANT
CLIP VESOCCLUDE MED 6/CT (CLIP) ×2 IMPLANT
CLIP VESOCCLUDE SM WIDE 6/CT (CLIP) ×2 IMPLANT
COVER PROBE W GEL 5X96 (DRAPES) IMPLANT
COVER WAND RF STERILE (DRAPES) ×2 IMPLANT
DERMABOND ADVANCED (GAUZE/BANDAGES/DRESSINGS) ×1
DERMABOND ADVANCED .7 DNX12 (GAUZE/BANDAGES/DRESSINGS) ×1 IMPLANT
ELECT REM PT RETURN 9FT ADLT (ELECTROSURGICAL) ×2
ELECTRODE REM PT RTRN 9FT ADLT (ELECTROSURGICAL) ×1 IMPLANT
GLOVE BIO SURGEON STRL SZ 6.5 (GLOVE) ×2 IMPLANT
GLOVE BIO SURGEON STRL SZ7.5 (GLOVE) ×2 IMPLANT
GLOVE BIOGEL PI IND STRL 7.0 (GLOVE) ×1 IMPLANT
GLOVE BIOGEL PI IND STRL 7.5 (GLOVE) ×1 IMPLANT
GLOVE BIOGEL PI INDICATOR 7.0 (GLOVE) ×1
GLOVE BIOGEL PI INDICATOR 7.5 (GLOVE) ×1
GLOVE ECLIPSE 7.0 STRL STRAW (GLOVE) ×2 IMPLANT
GOWN STRL REUS W/ TWL LRG LVL3 (GOWN DISPOSABLE) ×2 IMPLANT
GOWN STRL REUS W/ TWL XL LVL3 (GOWN DISPOSABLE) ×1 IMPLANT
GOWN STRL REUS W/TWL LRG LVL3 (GOWN DISPOSABLE) ×2
GOWN STRL REUS W/TWL XL LVL3 (GOWN DISPOSABLE) ×1
INSERT FOGARTY SM (MISCELLANEOUS) IMPLANT
KIT BASIN OR (CUSTOM PROCEDURE TRAY) ×2 IMPLANT
KIT TURNOVER KIT B (KITS) ×2 IMPLANT
NS IRRIG 1000ML POUR BTL (IV SOLUTION) ×2 IMPLANT
PACK CV ACCESS (CUSTOM PROCEDURE TRAY) ×2 IMPLANT
PAD ARMBOARD 7.5X6 YLW CONV (MISCELLANEOUS) ×4 IMPLANT
SUT MNCRL AB 4-0 PS2 18 (SUTURE) ×2 IMPLANT
SUT PROLENE 6 0 BV (SUTURE) ×2 IMPLANT
SUT VIC AB 3-0 SH 27 (SUTURE) ×1
SUT VIC AB 3-0 SH 27X BRD (SUTURE) ×1 IMPLANT
TOWEL GREEN STERILE (TOWEL DISPOSABLE) ×2 IMPLANT
UNDERPAD 30X30 (UNDERPADS AND DIAPERS) ×2 IMPLANT
WATER STERILE IRR 1000ML POUR (IV SOLUTION) ×2 IMPLANT

## 2018-11-20 NOTE — Anesthesia Postprocedure Evaluation (Signed)
Anesthesia Post Note  Patient: Samantha Pierce  Procedure(s) Performed: Creation of LEFT ARM ARTERIOVENOUS (AV) FISTULA (Left Arm Upper)     Patient location during evaluation: PACU Anesthesia Type: MAC Level of consciousness: awake and alert Pain management: pain level controlled Vital Signs Assessment: post-procedure vital signs reviewed and stable Respiratory status: spontaneous breathing, nonlabored ventilation and respiratory function stable Cardiovascular status: blood pressure returned to baseline and stable Postop Assessment: no apparent nausea or vomiting Anesthetic complications: no    Last Vitals:  Vitals:   11/20/18 1000 11/20/18 1015  BP:  (!) 149/70  Pulse: (!) 49 (!) 52  Resp: 16   Temp:  36.4 C  SpO2: 96% 97%    Last Pain:  Vitals:   11/20/18 1000  TempSrc:   PainSc: [place]

## 2018-11-20 NOTE — Anesthesia Procedure Notes (Signed)
Procedure Name: MAC Date/Time: 11/20/2018 8:33 AM Performed by: Barrington Ellison, CRNA Pre-anesthesia Checklist: Patient identified, Emergency Drugs available, Suction available, Patient being monitored and Timeout performed Patient Re-evaluated:Patient Re-evaluated prior to induction Oxygen Delivery Method: Simple face mask

## 2018-11-20 NOTE — H&P (Signed)
Subjective:    Samantha Pierce is a 48 y.o. female previous placement right brachiocephalic AV fistula.  This was over a year and a half ago.  She does have some right wrist pain that started after this as well as some right shoulder pains.  She is does not know about the timing related to her fistula.  Currently fistula appears thrombosed.  She is still chronic kidney disease but getting closer to dialysis.  Has not required dialysis yet.  States that she is lost about 50 pounds hopeful to get under 204 transplant.      Past Medical History:  Diagnosis Date  . Anemia   . Anxiety   . Arthritis    left arm  . Asystole ([place])    a. 05/2015: pt developed bradycardia->asystole during [place] nuclear stress test, s/p brief code. Cath with only 30% prox LCx. Her asystole during Lexiscan infusion was felt to represent an excessive pharmacologic response to the agent and likely superimposed vasovagal response.  . CKD (chronic kidney disease) stage 3, GFR 30-59 ml/min (HCC)    stage 4, not on dialysis  . COPD (chronic obstructive pulmonary disease) ([place])   . Depression   . Diabetes mellitus with diabetic retinopathy    type 2  . Difficulty walking   . Dyspnea   . Essential hypertension   . GERD (gastroesophageal reflux disease)   . History of blood transfusion   . History of stroke April 2016   Acute lacunar infarct of the left thalamus  . History of stroke   . Leg cramps   . Mild CAD    a. LHC 05/2015: 30% prox Cx, otherwise widely patent.  . Morbid obesity ([place])   . Neuropathy    both arms, feet  . PTSD (post-traumatic stress disorder)   . Seizures ([place])    has had one 12/26/15 - due to high blood sugar  . Stroke [facility])    right arm weakness, decrease feeling in fingers right hand  . TIA (transient ischemic attack)         Family History  Problem Relation Age of Onset  . Diabetes Mother   . Kidney failure Mother   . Diabetes Father   .  Amblyopia Neg Hx   . Blindness Neg Hx   . Cataracts Neg Hx   . Glaucoma Neg Hx   . Macular degeneration Neg Hx   . Retinal detachment Neg Hx   . Strabismus Neg Hx   . Retinitis pigmentosa Neg Hx         Past Surgical History:  Procedure Laterality Date  . AV FISTULA PLACEMENT Right 01/08/2017   Procedure: RIGHT ARM BRACHIOCEPHALIC ARTERIOVENOUS (AV) FISTULA CREATION;  Surgeon: Waynetta Sandy, MD;  Location: [facility];  Service: Vascular;  Laterality: Right;  . CARDIAC CATHETERIZATION N/A 06/01/2015   Procedure: Left Heart Cath and Coronary Angiography;  Surgeon: Belva Crome, MD; CFX 30%, no other dz, EF nl by echo  . CESAREAN SECTION     x2  . DILATION AND CURETTAGE OF UTERUS    . EYE SURGERY     Right eye pars plano vitrectomy   . LASIK    . PARS PLANA VITRECTOMY  04/20/2011   Procedure: PARS PLANA VITRECTOMY WITH 25 GAUGE;  Surgeon: Hayden Pedro, MD;  Location: [facility];  Service: Ophthalmology;  Laterality: Left;  membrane peel, gas fluid exchange, endolaser, repair of complex retinal detachment  . TUBAL LIGATION  Short Social History:  Social History        Tobacco Use  . Smoking status: Current Every Day Smoker    Packs/day: 0.03    Years: 30.00    Pack years: 0.90    Types: Cigarettes    Start date: 06/05/1981    Last attempt to quit: 12/25/2016    Years since quitting: 1.6  . Smokeless tobacco: Never Used  Substance Use Topics  . Alcohol use: No    Alcohol/week: 0.0 standard drinks         Allergies  Allergen Reactions  . Contrast Media [Iodinated Diagnostic Agents] Anaphylaxis    "code blue--I died"  . Penicillins Rash    Has patient had a PCN reaction causing immediate rash, facial/tongue/throat swelling, SOB or lightheadedness with hypotension: Yes Has patient had a PCN reaction causing severe rash involving mucus membranes or skin necrosis: No Has patient had a PCN reaction that required  hospitalization No Has patient had a PCN reaction occurring within the last 10 years: No If all of the above answers are "NO", then may proceed with Cephalosporin use.           Current Outpatient Medications  Medication Sig Dispense Refill  . albuterol (PROVENTIL HFA;VENTOLIN HFA) 108 (90 BASE) MCG/ACT inhaler Inhale 1-2 puffs into the lungs every 6 (six) hours as needed for wheezing or shortness of breath. (Patient taking differently: Inhale 1-2 puffs into the lungs every 6 (six) hours as needed for shortness of breath. ) 1 Inhaler 0  . amLODipine (NORVASC) 10 MG tablet Take 10 mg by mouth daily.    Marland Kitchen atorvastatin (LIPITOR) 80 MG tablet Take 1 tablet (80 mg total) by mouth daily at 6 PM. (Patient taking differently: Take 80 mg by mouth daily. ) 30 tablet 6  . brimonidine-timolol (COMBIGAN) 0.2-0.5 % ophthalmic solution Place 1 drop into both eyes every 12 (twelve) hours.     . cloNIDine (CATAPRES) 0.2 MG tablet Take 0.2 mg by mouth 2 (two) times daily.     . clopidogrel (PLAVIX) 75 MG tablet Take 1 tablet (75 mg total) by mouth daily. 30 tablet 0  . dorzolamide (TRUSOPT) 2 % ophthalmic solution Place 1 drop into both eyes 2 (two) times daily.     Marland Kitchen FLUoxetine (PROZAC) 20 MG capsule Take 20 mg by mouth daily.    . furosemide (LASIX) 40 MG tablet Take 40 mg by mouth 2 (two) times daily.    Marland Kitchen gabapentin (NEURONTIN) 300 MG capsule     . glucose blood (ACCU-CHEK GUIDE) test strip Use as instructed 150 each 2  . insulin aspart (NOVOLOG) 100 UNIT/ML FlexPen Inject 10-16 Units into the skin 3 (three) times daily before meals.    . Insulin Detemir (LEVEMIR) 100 UNIT/ML Pen Inject 50 Units into the skin at bedtime. 15 mL   . losartan (COZAAR) 25 MG tablet Take 1 tablet (25 mg total) by mouth daily. Hold until follow up with PCP (Patient taking differently: Take 100 mg by mouth daily. Hold until follow up with PCP)    . metoprolol succinate (TOPROL-XL) 25 MG 24 hr tablet Take 50  mg by mouth daily.     Marland Kitchen omeprazole (PRILOSEC) 20 MG capsule     . pantoprazole (PROTONIX) 40 MG tablet Take 1 tablet (40 mg total) by mouth daily. 30 tablet 1  . SYMBICORT 160-4.5 MCG/ACT inhaler     . Tafluprost, PF, (ZIOPTAN) 0.0015 % SOLN Apply to eye.    . tiotropium ([place])  18 MCG inhalation capsule Place 18 mcg into inhaler and inhale daily.    . traZODone (DESYREL) 100 MG tablet Take 150 mg by mouth at bedtime as needed for sleep.     . traZODone (DESYREL) 100 MG tablet Take 100 mg by mouth at bedtime.    . Vitamin D, Ergocalciferol, (DRISDOL) 50000 units CAPS capsule Take by mouth.    Marland Kitchen HYDROcodone-acetaminophen (NORCO/VICODIN) 5-325 MG tablet Take one-two tabs po q 4-6 hrs prn pain (Patient not taking: Reported on 08/16/2018) 10 tablet 0   No current facility-administered medications for this visit.     Review of Systems  Constitutional:  Constitutional negative. HENT: HENT negative.  Eyes: Eyes negative.  Respiratory: Respiratory negative.  Cardiovascular: Cardiovascular negative.  GI: Gastrointestinal negative.  Musculoskeletal:       Right arm pain Skin: Skin negative.  Neurological: Neurological negative. Hematologic: Hematologic/lymphatic negative.  Psychiatric: Psychiatric negative.        Objective:   Vitals:   11/20/18 0631  BP: 123/74  Pulse: 60  Resp: 18  Temp: 98.3 F (36.8 C)  SpO2: 100%     Physical Exam HENT:     Head: Normocephalic.  Eyes:     Pupils: Pupils are equal, round, and reactive to light.  Neck:     Musculoskeletal: Neck supple.  Cardiovascular:     Rate and Rhythm: Normal rate.     Pulses:          Radial pulses are 2+ on the right side and 2+ on the left side.  Pulmonary:     Effort: Pulmonary effort is normal.  Abdominal:     General: Abdomen is flat.     Palpations: Abdomen is soft.  Musculoskeletal: Normal range of motion.        General: No swelling.  Skin:    General: Skin is warm and  dry.     Capillary Refill: Capillary refill takes less than 2 seconds.  Neurological:     General: No focal deficit present.     Mental Status: She is alert.  Psychiatric:        Mood and Affect: Mood normal.        Behavior: Behavior normal.        Thought Content: Thought content normal.        Judgment: Judgment normal.     Data: I have independently interpreted her left upper extremity arterial duplex which demonstrates brachial artery 0.5 cm and triphasic waveforms   I have also independently interpreted her left upper extremity vein mapping which demonstrates cephalic vein 0.5 cm in the proximal forearm and a basilic vein 0.73 at the antecubital fossa.  I also used ultrasound to identify this as well appears to have suitable vein basilic at least for fistula creation.     Assessment/Plan:    48 year old female previous right brachiocephalic AV fistula creation as she is left-handed.  Appears to have basilic vein for fistula creation now on the left as the right is mostly thrombosed except at the anastomosis. Plan for left arm avf today in OR.   Gini Caputo C. Donzetta Matters, MD [facility] Office: [phone] Pager: [phone]

## 2018-11-20 NOTE — Discharge Instructions (Signed)
° °  [facility] ° °Discharge Instructions ° °AV Fistula or Graft Surgery for Dialysis Access ° °Please refer to the following instructions for your post-procedure care. Your surgeon or physician assistant will discuss any changes with you. ° °Activity ° °You may drive the day following your surgery, if you are comfortable and no longer taking prescription pain medication. Resume full activity as the soreness in your incision resolves. ° °Bathing/Showering ° °You may shower after you go home. Keep your incision dry for 48 hours. Do not soak in a bathtub, hot tub, or swim until the incision heals completely. You may not shower if you have a hemodialysis catheter. ° °Incision Care ° °Clean your incision with mild soap and water after 48 hours. Pat the area dry with a clean towel. You do not need a bandage unless otherwise instructed. Do not apply any ointments or creams to your incision. You may have skin glue on your incision. Do not peel it off. It will come off on its own in about one week. Your arm may swell a bit after surgery. To reduce swelling use pillows to elevate your arm so it is above your heart. Your doctor will tell you if you need to lightly wrap your arm with an ACE bandage. ° °Diet ° °Resume your normal diet. There are not special food restrictions following this procedure. In order to heal from your surgery, it is CRITICAL to get adequate nutrition. Your body requires vitamins, minerals, and protein. Vegetables are the best source of vitamins and minerals. Vegetables also provide the perfect balance of protein. Processed food has little nutritional value, so try to avoid this. ° °Medications ° °Resume taking all of your medications. If your incision is causing pain, you may take over-the counter pain relievers such as acetaminophen (Tylenol). If you were prescribed a stronger pain medication, please be aware these medications can cause nausea and constipation. Prevent  nausea by taking the medication with a snack or meal. Avoid constipation by drinking plenty of fluids and eating foods with high amount of fiber, such as fruits, vegetables, and grains. Do not take Tylenol if you are taking prescription pain medications. ° ° ° ° °Follow up °Your surgeon may want to see you in the office following your access surgery. If so, this will be arranged at the time of your surgery. ° °Please call us immediately for any of the following conditions: ° °Increased pain, redness, drainage (pus) from your incision site °Fever of 101 degrees or higher °Severe or worsening pain at your incision site °Hand pain or numbness. ° °Reduce your risk of vascular disease: ° °Stop smoking. If you would like help, call [facility] at 1-800-QUIT-NOW (1-800-784-8669) or [place] at [phone] ° °Manage your cholesterol °Maintain a desired weight °Control your diabetes °Keep your blood pressure down ° °Dialysis ° °It will take several weeks to several months for your new dialysis access to be ready for use. Your surgeon will determine when it is OK to use it. Your nephrologist will continue to direct your dialysis. You can continue to use your Permcath until your new access is ready for use. ° °If you have any questions, please call the office at [phone]. ° °

## 2018-11-20 NOTE — Op Note (Signed)
    Patient name: Samantha Pierce MRN: [id number] DOB: [birth year] Sex: female  11/20/2018 Pre-operative Diagnosis: Chronic kidney disease Post-operative diagnosis:  Same Surgeon:  Eda Paschal. Donzetta Matters, MD Assistant: Laurence Slate, PA Procedure Performed: Left arm for stage basilic vein fistula  Indications: 48 year old female with chronic kidney disease.  She is left-hand dominant has undergone dialysis access creation on the right which failed.  She now has suitable basilic vein on the left for access creation.  Findings: There was a large basilic vein measuring approximately 5 mm external diameter easily flushed with heparinized saline.  Brachial artery was healthy approximately 4 mm external diameter.  At completion there was a strong thrill in the runoff vein and a good signal at the radial artery at the wrist.   Procedure:  The patient was identified in the holding area and taken to the operating room where she is placed supine operative when MAC anesthesia induced.  She was sterilely prepped draped in the left upper extremity usual fashion antibiotics were administered and timeout was called.  Ultrasound was used to evaluate for cephalic vein which was quite diminutive followed by basilic vein which was noted be large.  The area was anesthetized 1% lidocaine.  Curvilinear incision was made between the artery and the vein.  We dissected out the vein first marked this for orientation.  We further dissected through the fascia identified the artery placed a vessel loop around this.  A few vein branches were taken between clips and ties.  The vein was transected distally flushed with heparinized saline and clamped.  The artery was clamped distally proximally opened longitudinally and flushed with heparinized saline both directions.  The artery was then sewn inside with 6-0 Prolene suture.  Prior to completion anastomosis we allowed flushing all directions.  Upon completion there was a strong thrill.  There  was some kinking of the vein from a superficial nerve branch and this was divided between clips to allow the vein to rest easily.  There was a good thrill in the vein as well as a radial artery signal at the wrist with Doppler.  Satisfied with this we irrigated the wound closed in layers Vicryl Monocryl.  Dermabond placed to level skin.  She was awake from anesthesia having tolerated procedure without any complication.  All counts were correct at completion.  EBL 20 cc   Brandon C. Donzetta Matters, MD [facility] Office: [phone] Pager: [phone]

## 2018-11-20 NOTE — Transfer of Care (Signed)
Immediate Anesthesia Transfer of Care Note  Patient: Samantha Pierce  Procedure(s) Performed: Creation of LEFT ARM ARTERIOVENOUS (AV) FISTULA (Left Arm Upper)  Patient Location: PACU  Anesthesia Type:MAC  Level of Consciousness: drowsy and patient cooperative  Airway & Oxygen Therapy: Patient Spontanous Breathing  Post-op Assessment: Report given to RN  Post vital signs: Reviewed and stable  Last Vitals:  Vitals Value Taken Time  BP 115/55 11/20/18 0941  Temp    Pulse 50 11/20/18 0942  Resp 24 11/20/18 0942  SpO2 96 % 11/20/18 0942  Vitals shown include unvalidated device data.  Last Pain:  Vitals:   11/20/18 0748  TempSrc:   PainSc: 0-No pain      Patients Stated Pain Goal: 2 (95/09/32 6712)  Complications: No apparent anesthesia complications

## 2018-11-21 ENCOUNTER — Encounter (HOSPITAL_COMMUNITY): Payer: Self-pay | Admitting: Vascular Surgery

## 2018-12-20 ENCOUNTER — Other Ambulatory Visit: Payer: Self-pay

## 2018-12-20 DIAGNOSIS — N184 Chronic kidney disease, stage 4 (severe): Secondary | ICD-10-CM

## 2018-12-26 ENCOUNTER — Inpatient Hospital Stay (HOSPITAL_COMMUNITY): Admission: RE | Admit: 2018-12-26 | Payer: Medicare HMO | Source: Ambulatory Visit

## 2018-12-27 ENCOUNTER — Ambulatory Visit (HOSPITAL_COMMUNITY): Payer: Medicare HMO

## 2019-01-17 ENCOUNTER — Ambulatory Visit (INDEPENDENT_AMBULATORY_CARE_PROVIDER_SITE_OTHER): Payer: Self-pay | Admitting: Physician Assistant

## 2019-01-17 ENCOUNTER — Ambulatory Visit (INDEPENDENT_AMBULATORY_CARE_PROVIDER_SITE_OTHER)
Admission: RE | Admit: 2019-01-17 | Discharge: 2019-01-17 | Disposition: A | Discharge status: Home / Self Care | Payer: Medicare HMO | Source: Ambulatory Visit | Attending: Vascular Surgery | Admitting: Vascular Surgery

## 2019-01-17 ENCOUNTER — Other Ambulatory Visit: Payer: Self-pay

## 2019-01-17 VITALS — BP 201/84 | HR 75 | Temp 97.6°F | Resp 20 | Ht 64.0 in | Wt 254.0 lb

## 2019-01-17 DIAGNOSIS — I130 Hypertensive heart and chronic kidney disease with heart failure and stage 1 through stage 4 chronic kidney disease, or unspecified chronic kidney disease: Secondary | ICD-10-CM | POA: Diagnosis not present

## 2019-01-17 DIAGNOSIS — N184 Chronic kidney disease, stage 4 (severe): Secondary | ICD-10-CM

## 2019-01-17 DIAGNOSIS — R0602 Shortness of breath: Secondary | ICD-10-CM | POA: Diagnosis not present

## 2019-01-17 NOTE — Progress Notes (Signed)
POST OPERATIVE OFFICE NOTE    CC:  F/u for surgery  HPI:  This is a 48 y.o. female who is s/p Left arm for stage basilic vein fistula.  She is here for f/u to check for maturity of the fistula.  She denise pain, loss of sensation or loss of motor.  Allergies  Allergen Reactions  . Contrast Media [Iodinated Diagnostic Agents] Anaphylaxis    "code blue--I died"  . Penicillins Rash and Other (See Comments)    Has patient had a PCN reaction causing immediate rash, facial/tongue/throat swelling, SOB or lightheadedness with hypotension: Yes Has patient had a PCN reaction causing severe rash involving mucus membranes or skin necrosis: No Has patient had a PCN reaction that required hospitalization No Has patient had a PCN reaction occurring within the last 10 years: No If all of the above answers are "NO", then may proceed with Cephalosporin use.     Current Outpatient Medications  Medication Sig Dispense Refill  . albuterol (PROVENTIL HFA;VENTOLIN HFA) 108 (90 BASE) MCG/ACT inhaler Inhale 1-2 puffs into the lungs every 6 (six) hours as needed for wheezing or shortness of breath. (Patient taking differently: Inhale 1-2 puffs into the lungs every 6 (six) hours as needed for shortness of breath. ) 1 Inhaler 0  . amLODipine (NORVASC) 10 MG tablet Take 10 mg by mouth daily.    Marland Kitchen atorvastatin (LIPITOR) 80 MG tablet Take 1 tablet (80 mg total) by mouth daily at 6 PM. (Patient taking differently: Take 80 mg by mouth daily. ) 30 tablet 6  . brimonidine-timolol (COMBIGAN) 0.2-0.5 % ophthalmic solution Place 1 drop into both eyes 2 (two) times a week.     . cloNIDine (CATAPRES) 0.2 MG tablet Take 0.2 mg by mouth 2 (two) times daily.     . clopidogrel (PLAVIX) 75 MG tablet Take 1 tablet (75 mg total) by mouth daily. 30 tablet 0  . dorzolamide (TRUSOPT) 2 % ophthalmic solution Place 1 drop into both eyes 2 (two) times daily as needed (eye pain.).     Marland Kitchen FLUoxetine (PROZAC) 20 MG capsule Take 40 mg by  mouth daily.     . furosemide (LASIX) 40 MG tablet Take 40 mg by mouth daily.     Marland Kitchen gabapentin (NEURONTIN) 300 MG capsule Take 300 mg by mouth at bedtime.     Marland Kitchen glucose blood (ACCU-CHEK GUIDE) test strip Use as instructed 150 each 2  . insulin aspart (NOVOLOG) 100 UNIT/ML FlexPen Inject 10 Units into the skin 3 (three) times daily before meals.     . Insulin Detemir (LEVEMIR) 100 UNIT/ML Pen Inject 50 Units into the skin at bedtime. (Patient taking differently: Inject 70 Units into the skin at bedtime. ) 15 mL   . losartan (COZAAR) 100 MG tablet Take 100 mg by mouth at bedtime.    . metoprolol succinate (TOPROL-XL) 50 MG 24 hr tablet Take 50 mg by mouth daily.    Marland Kitchen omeprazole (PRILOSEC) 20 MG capsule Take 20 mg by mouth daily before breakfast.     . oxyCODONE-acetaminophen (PERCOCET/ROXICET) 5-325 MG tablet Take 1 tablet by mouth every 6 (six) hours as needed. 10 tablet 0  . SYMBICORT 160-4.5 MCG/ACT inhaler Inhale 2 puffs into the lungs daily.     . Tafluprost, PF, (ZIOPTAN) 0.0015 % SOLN Place 1 drop into both eyes 2 (two) times a week.     . tiotropium (SPIRIVA) 18 MCG inhalation capsule Place 18 mcg into inhaler and inhale 3 (three) times a week.     Marland Kitchen  traZODone (DESYREL) 100 MG tablet Take 100 mg by mouth at bedtime.    . Vitamin D, Ergocalciferol, (DRISDOL) 50000 units CAPS capsule Take 50,000 Units by mouth every Sunday.      No current facility-administered medications for this visit.      ROS:  See HPI  Physical Exam:  +------------+----------+-------------+----------+--------+ OUTFLOW VEINPSV (cm/s)Diameter (cm)Depth (cm)Describe +------------+----------+-------------+----------+--------+ Prox UA        193        1.08        1.79            +------------+----------+-------------+----------+--------+ Mid UA         216        1.15        1.42            +------------+----------+-------------+----------+--------+ Dist UA        162        1.15        1.36             +------------+----------+-------------+----------+--------+ AC Fossa       20 0        0.97        1.43            +------------+----------+-------------+----------+--------+   Incision:  Well healed Extremities:  Grip 5/5, sensation grossly intact, known peripheral  neuropathy Palpable thrill in distal 1/3 of fistula  Assessment/Plan:  This is a 48 y.o. female who is s/p: First stage left AV basilic fistula creation   Plan for second stage basilic transposition.  She is currently not on HD.    Roxy Horseman PA-C [facility] [phone]  Clinic MD:  Oneida Alar

## 2019-01-19 ENCOUNTER — Other Ambulatory Visit: Payer: Self-pay

## 2019-01-19 ENCOUNTER — Encounter (HOSPITAL_COMMUNITY): Payer: Self-pay | Admitting: Emergency Medicine

## 2019-01-19 ENCOUNTER — Emergency Department (HOSPITAL_COMMUNITY): Payer: Medicare HMO

## 2019-01-19 ENCOUNTER — Inpatient Hospital Stay (HOSPITAL_COMMUNITY)
Admission: EM | Admit: 2019-01-19 | Discharge: 2019-01-21 | DRG: 291 | Disposition: A | Discharge status: Home Health | Payer: Medicare HMO | Attending: Internal Medicine | Admitting: Internal Medicine

## 2019-01-19 DIAGNOSIS — N184 Chronic kidney disease, stage 4 (severe): Secondary | ICD-10-CM | POA: Diagnosis present

## 2019-01-19 DIAGNOSIS — Z833 Family history of diabetes mellitus: Secondary | ICD-10-CM

## 2019-01-19 DIAGNOSIS — I69331 Monoplegia of upper limb following cerebral infarction affecting right dominant side: Secondary | ICD-10-CM

## 2019-01-19 DIAGNOSIS — Z91041 Radiographic dye allergy status: Secondary | ICD-10-CM

## 2019-01-19 DIAGNOSIS — I5031 Acute diastolic (congestive) heart failure: Secondary | ICD-10-CM | POA: Diagnosis present

## 2019-01-19 DIAGNOSIS — Z794 Long term (current) use of insulin: Secondary | ICD-10-CM

## 2019-01-19 DIAGNOSIS — F1410 Cocaine abuse, uncomplicated: Secondary | ICD-10-CM

## 2019-01-19 DIAGNOSIS — E785 Hyperlipidemia, unspecified: Secondary | ICD-10-CM | POA: Diagnosis present

## 2019-01-19 DIAGNOSIS — Z7951 Long term (current) use of inhaled steroids: Secondary | ICD-10-CM

## 2019-01-19 DIAGNOSIS — Z880 Allergy status to penicillin: Secondary | ICD-10-CM

## 2019-01-19 DIAGNOSIS — I509 Heart failure, unspecified: Secondary | ICD-10-CM

## 2019-01-19 DIAGNOSIS — F4310 Post-traumatic stress disorder, unspecified: Secondary | ICD-10-CM | POA: Diagnosis present

## 2019-01-19 DIAGNOSIS — E1140 Type 2 diabetes mellitus with diabetic neuropathy, unspecified: Secondary | ICD-10-CM | POA: Diagnosis present

## 2019-01-19 DIAGNOSIS — Z720 Tobacco use: Secondary | ICD-10-CM

## 2019-01-19 DIAGNOSIS — Z20828 Contact with and (suspected) exposure to other viral communicable diseases: Secondary | ICD-10-CM | POA: Diagnosis present

## 2019-01-19 DIAGNOSIS — E11319 Type 2 diabetes mellitus with unspecified diabetic retinopathy without macular edema: Secondary | ICD-10-CM | POA: Diagnosis present

## 2019-01-19 DIAGNOSIS — R0600 Dyspnea, unspecified: Secondary | ICD-10-CM | POA: Diagnosis present

## 2019-01-19 DIAGNOSIS — R0609 Other forms of dyspnea: Secondary | ICD-10-CM

## 2019-01-19 DIAGNOSIS — K219 Gastro-esophageal reflux disease without esophagitis: Secondary | ICD-10-CM | POA: Diagnosis present

## 2019-01-19 DIAGNOSIS — E1165 Type 2 diabetes mellitus with hyperglycemia: Secondary | ICD-10-CM

## 2019-01-19 DIAGNOSIS — I130 Hypertensive heart and chronic kidney disease with heart failure and stage 1 through stage 4 chronic kidney disease, or unspecified chronic kidney disease: Principal | ICD-10-CM | POA: Diagnosis present

## 2019-01-19 DIAGNOSIS — I2510 Atherosclerotic heart disease of native coronary artery without angina pectoris: Secondary | ICD-10-CM | POA: Diagnosis present

## 2019-01-19 DIAGNOSIS — D631 Anemia in chronic kidney disease: Secondary | ICD-10-CM | POA: Diagnosis present

## 2019-01-19 DIAGNOSIS — E1122 Type 2 diabetes mellitus with diabetic chronic kidney disease: Secondary | ICD-10-CM | POA: Diagnosis present

## 2019-01-19 DIAGNOSIS — I10 Essential (primary) hypertension: Secondary | ICD-10-CM | POA: Diagnosis present

## 2019-01-19 DIAGNOSIS — J449 Chronic obstructive pulmonary disease, unspecified: Secondary | ICD-10-CM | POA: Diagnosis present

## 2019-01-19 DIAGNOSIS — Z87891 Personal history of nicotine dependence: Secondary | ICD-10-CM

## 2019-01-19 DIAGNOSIS — F319 Bipolar disorder, unspecified: Secondary | ICD-10-CM | POA: Diagnosis present

## 2019-01-19 DIAGNOSIS — N179 Acute kidney failure, unspecified: Secondary | ICD-10-CM | POA: Diagnosis present

## 2019-01-19 DIAGNOSIS — Z6841 Body Mass Index (BMI) 40.0 and over, adult: Secondary | ICD-10-CM

## 2019-01-19 DIAGNOSIS — E7800 Pure hypercholesterolemia, unspecified: Secondary | ICD-10-CM | POA: Diagnosis present

## 2019-01-19 DIAGNOSIS — Z841 Family history of disorders of kidney and ureter: Secondary | ICD-10-CM

## 2019-01-19 DIAGNOSIS — R0602 Shortness of breath: Secondary | ICD-10-CM | POA: Diagnosis not present

## 2019-01-19 DIAGNOSIS — Z7902 Long term (current) use of antithrombotics/antiplatelets: Secondary | ICD-10-CM

## 2019-01-19 LAB — CBC WITH DIFFERENTIAL/PLATELET
Abs Immature Granulocytes: 0.01 10*3/uL (ref 0.00–0.07)
Basophils Absolute: 0.1 10*3/uL (ref 0.0–0.1)
Basophils Relative: 1 %
Eosinophils Absolute: 0.6 10*3/uL — ABNORMAL HIGH (ref 0.0–0.5)
Eosinophils Relative: 10 %
HCT: 39 % (ref 36.0–46.0)
Hemoglobin: 11.9 g/dL — ABNORMAL LOW (ref 12.0–15.0)
Immature Granulocytes: 0 %
Lymphocytes Relative: 24 %
Lymphs Abs: 1.6 10*3/uL (ref 0.7–4.0)
MCH: 25.8 pg — ABNORMAL LOW (ref 26.0–34.0)
MCHC: 30.5 g/dL (ref 30.0–36.0)
MCV: 84.6 fL (ref 80.0–100.0)
Monocytes Absolute: 0.7 10*3/uL (ref 0.1–1.0)
Monocytes Relative: 10 %
Neutro Abs: 3.7 10*3/uL (ref 1.7–7.7)
Neutrophils Relative %: 55 %
Platelets: 270 10*3/uL (ref 150–400)
RBC: 4.61 MIL/uL (ref 3.87–5.11)
RDW: 13.3 % (ref 11.5–15.5)
WBC: 6.7 10*3/uL (ref 4.0–10.5)
nRBC: 0 % (ref 0.0–0.2)

## 2019-01-19 LAB — TROPONIN I (HIGH SENSITIVITY)
Troponin I (High Sensitivity): 10 ng/L (ref <18)
Troponin I (High Sensitivity): 9 ng/L (ref <18)

## 2019-01-19 LAB — COMPREHENSIVE METABOLIC PANEL
ALT: 18 U/L (ref 0–44)
AST: 13 U/L — ABNORMAL LOW (ref 15–41)
Albumin: 3 g/dL — ABNORMAL LOW (ref 3.5–5.0)
Alkaline Phosphatase: 54 U/L (ref 38–126)
Anion gap: 7 (ref 5–15)
BUN: 34 mg/dL — ABNORMAL HIGH (ref 6–20)
CO2: 24 mmol/L (ref 22–32)
Calcium: 8.7 mg/dL — ABNORMAL LOW (ref 8.9–10.3)
Chloride: 104 mmol/L (ref 98–111)
Creatinine, Ser: 3.28 mg/dL — ABNORMAL HIGH (ref 0.44–1.00)
GFR calc Af Amer: 18 mL/min — ABNORMAL LOW (ref >60)
GFR calc non Af Amer: 16 mL/min — ABNORMAL LOW (ref >60)
Glucose, Bld: 169 mg/dL — ABNORMAL HIGH (ref 70–99)
Potassium: 4.4 mmol/L (ref 3.5–5.1)
Sodium: 135 mmol/L (ref 135–145)
Total Bilirubin: 0.7 mg/dL (ref 0.3–1.2)
Total Protein: 6.2 g/dL — ABNORMAL LOW (ref 6.5–8.1)

## 2019-01-19 LAB — URINALYSIS, ROUTINE W REFLEX MICROSCOPIC
Bilirubin Urine: NEGATIVE
Glucose, UA: 50 mg/dL — AB
Hgb urine dipstick: NEGATIVE
Ketones, ur: NEGATIVE mg/dL
Leukocytes,Ua: NEGATIVE
Nitrite: NEGATIVE
Protein, ur: >=300 mg/dL — AB
Specific Gravity, Urine: 1.013 (ref 1.005–1.030)
pH: 6 (ref 5.0–8.0)

## 2019-01-19 LAB — HEMOGLOBIN A1C
Hgb A1c MFr Bld: 9.8 % — ABNORMAL HIGH (ref 4.8–5.6)
Mean Plasma Glucose: 234.56 mg/dL

## 2019-01-19 LAB — GLUCOSE, CAPILLARY: Glucose-Capillary: 314 mg/dL — ABNORMAL HIGH (ref 70–99)

## 2019-01-19 LAB — BRAIN NATRIURETIC PEPTIDE: B Natriuretic Peptide: 320 pg/mL — ABNORMAL HIGH (ref 0.0–100.0)

## 2019-01-19 LAB — SARS CORONAVIRUS 2 BY RT PCR (HOSPITAL ORDER, PERFORMED IN ~~LOC~~ HOSPITAL LAB): SARS Coronavirus 2: NEGATIVE

## 2019-01-19 LAB — POC URINE PREG, ED: Preg Test, Ur: NEGATIVE

## 2019-01-19 MED ORDER — ALBUTEROL SULFATE (2.5 MG/3ML) 0.083% IN NEBU: 5.0000 mg | INHALATION_SOLUTION | Freq: Once | RESPIRATORY_TRACT | Status: DC

## 2019-01-19 MED ORDER — ATORVASTATIN CALCIUM 40 MG PO TABS
80.0000 mg | ORAL_TABLET | Freq: Every day | ORAL | Status: DC
  Administered 2019-01-19 – 2019-01-21 (×3): 80 mg via ORAL
  Filled 2019-01-19: qty 4
  Filled 2019-01-19 (×2): qty 2

## 2019-01-19 MED ORDER — ONDANSETRON HCL 4 MG/2ML IJ SOLN: 4.0000 mg | Freq: Four times a day (QID) | INTRAMUSCULAR | Status: DC | PRN

## 2019-01-19 MED ORDER — TIOTROPIUM BROMIDE MONOHYDRATE 18 MCG IN CAPS
18.0000 ug | ORAL_CAPSULE | RESPIRATORY_TRACT | Status: DC
  Filled 2019-01-19: qty 5

## 2019-01-19 MED ORDER — LOSARTAN POTASSIUM 50 MG PO TABS
100.0000 mg | ORAL_TABLET | Freq: Every day | ORAL | Status: DC
  Administered 2019-01-19: 100 mg via ORAL
  Filled 2019-01-19: qty 2

## 2019-01-19 MED ORDER — HYDRALAZINE HCL 20 MG/ML IJ SOLN: 10.0000 mg | INTRAMUSCULAR | Status: DC | PRN

## 2019-01-19 MED ORDER — IPRATROPIUM-ALBUTEROL 0.5-2.5 (3) MG/3ML IN SOLN
3.0000 mL | Freq: Once | RESPIRATORY_TRACT | Status: AC
  Administered 2019-01-19: 3 mL via RESPIRATORY_TRACT
  Filled 2019-01-19: qty 3

## 2019-01-19 MED ORDER — AMLODIPINE BESYLATE 5 MG PO TABS
10.0000 mg | ORAL_TABLET | Freq: Every day | ORAL | Status: DC
  Administered 2019-01-20 – 2019-01-21 (×2): 10 mg via ORAL
  Filled 2019-01-19 (×2): qty 2

## 2019-01-19 MED ORDER — INSULIN ASPART 100 UNIT/ML ~~LOC~~ SOLN
5.0000 [IU] | Freq: Once | SUBCUTANEOUS | Status: AC
  Administered 2019-01-19: 5 [IU] via SUBCUTANEOUS

## 2019-01-19 MED ORDER — ACETAMINOPHEN 325 MG PO TABS: 650.0000 mg | ORAL_TABLET | Freq: Four times a day (QID) | ORAL | Status: DC | PRN

## 2019-01-19 MED ORDER — ALBUTEROL SULFATE HFA 108 (90 BASE) MCG/ACT IN AERS
4.0000 | INHALATION_SPRAY | Freq: Once | RESPIRATORY_TRACT | Status: AC
  Administered 2019-01-19: 4 via RESPIRATORY_TRACT
  Filled 2019-01-19: qty 6.7

## 2019-01-19 MED ORDER — IPRATROPIUM-ALBUTEROL 0.5-2.5 (3) MG/3ML IN SOLN
3.0000 mL | RESPIRATORY_TRACT | Status: DC | PRN
  Administered 2019-01-19: 3 mL via RESPIRATORY_TRACT
  Filled 2019-01-19: qty 3

## 2019-01-19 MED ORDER — ONDANSETRON HCL 4 MG PO TABS: 4.0000 mg | ORAL_TABLET | Freq: Four times a day (QID) | ORAL | Status: DC | PRN

## 2019-01-19 MED ORDER — POLYETHYLENE GLYCOL 3350 17 G PO PACK
17.0000 g | PACK | Freq: Every day | ORAL | Status: DC | PRN
  Administered 2019-01-20: 17 g via ORAL
  Filled 2019-01-19: qty 1

## 2019-01-19 MED ORDER — CLONIDINE HCL 0.2 MG PO TABS
0.2000 mg | ORAL_TABLET | Freq: Two times a day (BID) | ORAL | Status: DC
  Administered 2019-01-19 – 2019-01-21 (×4): 0.2 mg via ORAL
  Filled 2019-01-19 (×4): qty 1

## 2019-01-19 MED ORDER — ACETAMINOPHEN 650 MG RE SUPP: 650.0000 mg | Freq: Four times a day (QID) | RECTAL | Status: DC | PRN

## 2019-01-19 MED ORDER — METOPROLOL SUCCINATE ER 50 MG PO TB24
50.0000 mg | ORAL_TABLET | Freq: Every day | ORAL | Status: DC
  Administered 2019-01-19 – 2019-01-21 (×3): 50 mg via ORAL
  Filled 2019-01-19 (×3): qty 1

## 2019-01-19 MED ORDER — INSULIN ASPART 100 UNIT/ML ~~LOC~~ SOLN
0.0000 [IU] | Freq: Three times a day (TID) | SUBCUTANEOUS | Status: DC
  Administered 2019-01-20: 8 [IU] via SUBCUTANEOUS

## 2019-01-19 MED ORDER — FLUOXETINE HCL 20 MG PO CAPS
40.0000 mg | ORAL_CAPSULE | Freq: Every day | ORAL | Status: DC
  Administered 2019-01-20 – 2019-01-21 (×2): 40 mg via ORAL
  Filled 2019-01-19 (×2): qty 2

## 2019-01-19 MED ORDER — FUROSEMIDE 10 MG/ML IJ SOLN
40.0000 mg | Freq: Once | INTRAMUSCULAR | Status: AC
  Administered 2019-01-19: 40 mg via INTRAVENOUS
  Filled 2019-01-19: qty 4

## 2019-01-19 MED ORDER — TRAZODONE HCL 50 MG PO TABS
100.0000 mg | ORAL_TABLET | Freq: Every day | ORAL | Status: DC
  Administered 2019-01-19: 100 mg via ORAL
  Administered 2019-01-20: 50 mg via ORAL
  Administered 2019-01-20: 100 mg via ORAL
  Filled 2019-01-19 (×3): qty 2

## 2019-01-19 MED ORDER — AMLODIPINE BESYLATE 5 MG PO TABS
10.0000 mg | ORAL_TABLET | Freq: Once | ORAL | Status: AC
  Administered 2019-01-19: 10 mg via ORAL
  Filled 2019-01-19: qty 2

## 2019-01-19 MED ORDER — INSULIN DETEMIR 100 UNIT/ML ~~LOC~~ SOLN
40.0000 [IU] | Freq: Every day | SUBCUTANEOUS | Status: DC
  Administered 2019-01-19: 40 [IU] via SUBCUTANEOUS
  Filled 2019-01-19: qty 0.4

## 2019-01-19 MED ORDER — HEPARIN SODIUM (PORCINE) 5000 UNIT/ML IJ SOLN
5000.0000 [IU] | Freq: Three times a day (TID) | INTRAMUSCULAR | Status: DC
  Administered 2019-01-19 – 2019-01-21 (×5): 5000 [IU] via SUBCUTANEOUS
  Filled 2019-01-19 (×5): qty 1

## 2019-01-19 MED ORDER — PANTOPRAZOLE SODIUM 40 MG PO TBEC
40.0000 mg | DELAYED_RELEASE_TABLET | Freq: Every day | ORAL | Status: DC
  Administered 2019-01-19 – 2019-01-21 (×3): 40 mg via ORAL
  Filled 2019-01-19 (×3): qty 1

## 2019-01-19 MED ORDER — CLOPIDOGREL BISULFATE 75 MG PO TABS
75.0000 mg | ORAL_TABLET | Freq: Every day | ORAL | Status: DC
  Administered 2019-01-20 – 2019-01-21 (×2): 75 mg via ORAL
  Filled 2019-01-19 (×2): qty 1

## 2019-01-19 MED ORDER — MOMETASONE FURO-FORMOTEROL FUM 200-5 MCG/ACT IN AERO
2.0000 | INHALATION_SPRAY | Freq: Two times a day (BID) | RESPIRATORY_TRACT | Status: DC
  Administered 2019-01-19 – 2019-01-21 (×4): 2 via RESPIRATORY_TRACT
  Filled 2019-01-19: qty 8.8

## 2019-01-19 MED ORDER — NITROGLYCERIN 0.4 MG SL SUBL
0.4000 mg | SUBLINGUAL_TABLET | Freq: Once | SUBLINGUAL | Status: AC
  Administered 2019-01-19: 0.4 mg via SUBLINGUAL
  Filled 2019-01-19: qty 1

## 2019-01-19 MED ORDER — METOPROLOL TARTRATE 50 MG PO TABS
50.0000 mg | ORAL_TABLET | Freq: Once | ORAL | Status: AC
  Administered 2019-01-19: 50 mg via ORAL
  Filled 2019-01-19: qty 1

## 2019-01-19 MED ORDER — GABAPENTIN 300 MG PO CAPS
300.0000 mg | ORAL_CAPSULE | Freq: Every day | ORAL | Status: DC
  Administered 2019-01-19 – 2019-01-20 (×2): 300 mg via ORAL
  Filled 2019-01-19 (×2): qty 1

## 2019-01-19 MED ORDER — FUROSEMIDE 10 MG/ML IJ SOLN
40.0000 mg | Freq: Two times a day (BID) | INTRAMUSCULAR | Status: AC
  Administered 2019-01-20: 40 mg via INTRAVENOUS
  Filled 2019-01-19: qty 4

## 2019-01-19 MED ORDER — METHYLPREDNISOLONE SODIUM SUCC 125 MG IJ SOLR
125.0000 mg | Freq: Once | INTRAMUSCULAR | Status: AC
  Administered 2019-01-19: 125 mg via INTRAVENOUS
  Filled 2019-01-19: qty 2

## 2019-01-19 MED ORDER — SODIUM BICARBONATE 650 MG PO TABS
650.0000 mg | ORAL_TABLET | Freq: Two times a day (BID) | ORAL | Status: DC
  Administered 2019-01-19 – 2019-01-21 (×4): 650 mg via ORAL
  Filled 2019-01-19 (×4): qty 1

## 2019-01-19 NOTE — ED Provider Notes (Addendum)
[facility] EMERGENCY DEPARTMENT Provider Note   CSN: [id number] Arrival date & time: 01/19/19  0940    History   Chief Complaint Chief Complaint  Patient presents with   Shortness of Breath    HPI Samantha Pierce is a 48 y.o. female with a history of anemia, anxiety, bipolar disorder, chronic kidney disease stage IV and being watched closely for need of dialysis, COPD, diabetes, GERD, history of CVA with mild residual right arm weakness presenting with shortness of breath x1 week.  She denies fevers or chills, cough has been chronic and productive of a white to clear sputum.  She reports pressure in her upper abdomen and her midsternal area radiating into her left chest  which is also been present x1 week and constant  Sob is worsened with exertion and lying flat worsens her shortness of breath.  She denies a history of CHF.  She has had no ankle edema.  She has taken her morning medicines except for her metoprolol and her amlodipine which she has run out of, last used her albuterol inhaler 2 hours before arrival with no significant improvement in breathing improvement.  She denies wheezing.  HPI: A 48 year old patient with a history of CVA, treated diabetes, hypertension, hypercholesterolemia and obesity presents for evaluation of chest pain. Initial onset of pain was more than 6 hours ago. The patient's chest pain is described as heaviness/pressure/tightness and is not worse with exertion. The patient's chest pain is middle- or left-sided, is not well-localized, is not sharp and does not radiate to the arms/jaw/neck. The patient does not complain of nausea and denies diaphoresis. The patient has no history of peripheral artery disease, has not smoked in the past 90 days and has no relevant family history of coronary artery disease (first degree relative at less than age 60).   The history is provided by the patient.    Past Medical History:  Diagnosis Date   Anemia    Anxiety     Arthritis    left arm   Asystole ([place])    a. 05/2015: pt developed bradycardia->asystole during [place] nuclear stress test, s/p brief code. Cath with only 30% prox LCx. Her asystole during Lexiscan infusion was felt to represent an excessive pharmacologic response to the agent and likely superimposed vasovagal response.   Bipolar disorder ([place])    CKD (chronic kidney disease) stage 3, GFR 30-59 ml/min (HCC)    stage 4, not on dialysis   COPD (chronic obstructive pulmonary disease) (HCC)    Depression    Diabetes mellitus with diabetic retinopathy    type 2   Difficulty walking    Dyspnea    Essential hypertension    GERD (gastroesophageal reflux disease)    History of blood transfusion    History of stroke April 2016   Acute lacunar infarct of the left thalamus   History of stroke    Leg cramps    Mild CAD    a. LHC 05/2015: 30% prox Cx, otherwise widely patent.   Morbid obesity (HCC)    Neuropathy    both arms, feet   PTSD (post-traumatic stress disorder)    PTSD (post-traumatic stress disorder)    Seizures ([place])    has had one 12/26/15 - due to high blood sugar   Stroke ([place])    right arm weakness, decrease feeling in fingers right hand   TIA (transient ischemic attack)     Patient Active Problem List   Diagnosis Date Noted  AKI (acute kidney injury) ([place]) 11/19/2017   COPD (chronic obstructive pulmonary disease) ([place]) 11/19/2017   GERD (gastroesophageal reflux disease) 11/19/2017   Mild CAD 06/02/2015   Bradycardia 06/02/2015   Morbid obesity ([place])    Asystole ([place])    Pain in the chest    Hyperlipidemia    TIA (transient ischemic attack) 99991111   Diastolic dysfunction 99991111   Essential hypertension 09/04/2014   Uncontrolled diabetes mellitus with stage 4 chronic kidney disease ([place]) 09/04/2014   Lacunar stroke, acute ([place]) 09/04/2014   Paresthesias/numbness 09/04/2014   CKD (chronic kidney disease), stage III  ([place]) 09/04/2014   Obesity 09/04/2014   CVA (cerebral infarction) 09/04/2014   Proliferative diabetic retinopathy of both eyes ([place]) 04/11/2011   Retinal detachment, traction 04/11/2011    Past Surgical History:  Procedure Laterality Date   AV FISTULA PLACEMENT Right 01/08/2017   Procedure: RIGHT ARM BRACHIOCEPHALIC ARTERIOVENOUS (AV) FISTULA CREATION;  Surgeon: Waynetta Sandy, MD;  Location: [place];  Service: Vascular;  Laterality: Right;   AV FISTULA PLACEMENT Left 11/20/2018   Procedure: Creation of LEFT ARM ARTERIOVENOUS (AV) FISTULA;  Surgeon: Waynetta Sandy, MD;  Location: [place];  Service: Vascular;  Laterality: Left;   CARDIAC CATHETERIZATION N/A 06/01/2015   Procedure: Left Heart Cath and Coronary Angiography;  Surgeon: Belva Crome, MD; CFX 30%, no other dz, EF nl by echo   CESAREAN SECTION     x2   DILATION AND CURETTAGE OF UTERUS     EYE SURGERY     Right eye pars plano vitrectomy    LASIK     PARS PLANA VITRECTOMY  04/20/2011   Procedure: PARS PLANA VITRECTOMY WITH 25 GAUGE;  Surgeon: Hayden Pedro, MD;  Location: [place];  Service: Ophthalmology;  Laterality: Left;  membrane peel, gas fluid exchange, endolaser, repair of complex retinal detachment   TUBAL LIGATION       OB History    Gravida  2   Para  2   Term  2   Preterm      AB      Living  2     SAB      TAB      Ectopic      Multiple      Live Births               Home Medications    Prior to Admission medications   Medication Sig Start Date End Date Taking? Authorizing Provider  albuterol (PROVENTIL HFA;VENTOLIN HFA) 108 (90 BASE) MCG/ACT inhaler Inhale 1-2 puffs into the lungs every 6 (six) hours as needed for wheezing or shortness of breath. Patient taking differently: Inhale 1-2 puffs into the lungs every 6 (six) hours as needed for shortness of breath.  06/20/14   Nat Christen, MD  amLODipine (NORVASC) 10 MG tablet Take 10 mg by mouth daily.     [provider]  atorvastatin (LIPITOR) 80 MG tablet Take 1 tablet (80 mg total) by mouth daily at 6 PM. Patient taking differently: Take 80 mg by mouth daily.  08/05/15   Satira Sark, MD  brimonidine-timolol (COMBIGAN) 0.2-0.5 % ophthalmic solution Place 1 drop into both eyes 2 (two) times a week.     [provider]  calcitRIOL (ROCALTROL) 0.5 MCG capsule Take by mouth. 11/19/18 11/20/19  [provider]  cloNIDine (CATAPRES) 0.2 MG tablet Take 0.2 mg by mouth 2 (two) times daily.     [provider]  clopidogrel (PLAVIX)  75 MG tablet Take 1 tablet (75 mg total) by mouth daily. 05/24/15   Hongalgi, Lenis Dickinson, MD  dorzolamide (TRUSOPT) 2 % ophthalmic solution Place 1 drop into both eyes 2 (two) times daily as needed (eye pain.).     [provider]  FLUoxetine (PROZAC) 20 MG capsule Take 40 mg by mouth daily.     [provider]  furosemide (LASIX) 40 MG tablet Take 40 mg by mouth daily.     [provider]  gabapentin (NEURONTIN) 300 MG capsule Take 300 mg by mouth at bedtime.  02/07/18   [provider]  glucose blood (ACCU-CHEK GUIDE) test strip Use as instructed 07/30/18   Cassandria Anger, MD  insulin aspart (NOVOLOG) 100 UNIT/ML FlexPen Inject 10 Units into the skin 3 (three) times daily before meals.     [provider]  Insulin Detemir (LEVEMIR) 100 UNIT/ML Pen Inject 50 Units into the skin at bedtime. Patient taking differently: Inject 70 Units into the skin at bedtime.  07/30/18   Cassandria Anger, MD  losartan (COZAAR) 100 MG tablet Take 100 mg by mouth at bedtime. 10/11/18   [provider]  metoprolol succinate (TOPROL-XL) 50 MG 24 hr tablet Take 50 mg by mouth daily. 10/11/18   [provider]  omeprazole (PRILOSEC) 20 MG capsule Take 20 mg by mouth daily before breakfast.  02/07/18   [provider]  oxyCODONE-acetaminophen (PERCOCET/ROXICET) 5-325 MG tablet Take 1 tablet by  mouth every 6 (six) hours as needed. 11/20/18   Ulyses Amor, PA-C  sodium bicarbonate 650 MG tablet Take by mouth. 11/19/18   [provider]  SYMBICORT 160-4.5 MCG/ACT inhaler Inhale 2 puffs into the lungs daily.  02/07/18   [provider]  Tafluprost, PF, (ZIOPTAN) 0.0015 % SOLN Place 1 drop into both eyes 2 (two) times a week.     [provider]  tiotropium (SPIRIVA) 18 MCG inhalation capsule Place 18 mcg into inhaler and inhale 3 (three) times a week.     [provider]  traZODone (DESYREL) 100 MG tablet Take 100 mg by mouth at bedtime.    [provider]  Vitamin D, Ergocalciferol, (DRISDOL) 50000 units CAPS capsule Take 50,000 Units by mouth every Sunday.  02/12/18   [provider]    Family History Family History  Problem Relation Age of Onset   Diabetes Mother    Kidney failure Mother    Diabetes Father    Amblyopia Neg Hx    Blindness Neg Hx    Cataracts Neg Hx    Glaucoma Neg Hx    Macular degeneration Neg Hx    Retinal detachment Neg Hx    Strabismus Neg Hx    Retinitis pigmentosa Neg Hx     Social History Social History   Tobacco Use   Smoking status: Current Some Day Smoker    Packs/day: 0.03    Years: 30.00    Pack years: 0.90    Types: Cigarettes    Start date: 06/05/1981   Smokeless tobacco: Never Used  Substance Use Topics   Alcohol use: Yes    Alcohol/week: 0.0 standard drinks    Comment: occ   Drug use: No     Allergies   Contrast media [iodinated diagnostic agents] and Penicillins   Review of Systems Review of Systems  Constitutional: Negative for chills and fever.  HENT: Negative for congestion and sore throat.   Eyes: Negative.   Respiratory: Positive for cough,  chest tightness and shortness of breath.   Cardiovascular: Negative for chest pain, palpitations and leg swelling.  Gastrointestinal: Negative for abdominal pain, nausea and vomiting.  Genitourinary: Negative.     Musculoskeletal: Negative for arthralgias, joint swelling and neck pain.  Skin: Negative.  Negative for rash and wound.  Neurological: Negative for dizziness, weakness, light-headedness, numbness and headaches.  Psychiatric/Behavioral: Negative.      Physical Exam Updated Vital Signs BP (!) 171/76 (BP Location: Right Leg)    Pulse 67    Temp 98.1 F (36.7 C) (Oral)    Resp 18    Ht 5\' 4"  (1.626 m)    Wt 114.3 kg    LMP 01/12/2019 Comment: neg preg   SpO2 99%    BMI 43.26 kg/m   Physical Exam Vitals signs and nursing note reviewed.  Constitutional:      Appearance: She is well-developed.  HENT:     Head: Normocephalic and atraumatic.  Eyes:     Conjunctiva/sclera: Conjunctivae normal.  Neck:     Musculoskeletal: Normal range of motion.     Vascular: No JVD.  Cardiovascular:     Rate and Rhythm: Normal rate and regular rhythm.     Heart sounds: Normal heart sounds.  Pulmonary:     Effort: Pulmonary effort is normal.     Breath sounds: Decreased breath sounds present. No wheezing, rhonchi or rales.     Comments: Decreased breath sounds throughout all lung fields.  There is no obvious rales present, no wheezing.  There is slightly prolonged expiratory phase. Chest:     Chest wall: No mass or tenderness.  Abdominal:     General: Bowel sounds are normal.     Palpations: Abdomen is soft.     Tenderness: There is no abdominal tenderness.  Musculoskeletal: Normal range of motion.     Right lower leg: She exhibits no tenderness. No edema.     Left lower leg: She exhibits no tenderness. No edema.  Skin:    General: Skin is warm and dry.  Neurological:     Mental Status: She is alert.      ED Treatments / Results  Labs (all labs ordered are listed, but only abnormal results are displayed) Labs Reviewed  CBC WITH DIFFERENTIAL/PLATELET - Abnormal; Notable for the following components:      Result Value   Hemoglobin 11.9 (*)    MCH 25.8 (*)    Eosinophils Absolute 0.6 (*)     All other components within normal limits  BRAIN NATRIURETIC PEPTIDE - Abnormal; Notable for the following components:   B Natriuretic Peptide 320.0 (*)    All other components within normal limits  URINALYSIS, ROUTINE W REFLEX MICROSCOPIC - Abnormal; Notable for the following components:   APPearance HAZY (*)    Glucose, UA 50 (*)    Protein, ur >=300 (*)    Bacteria, UA RARE (*)    All other components within normal limits  COMPREHENSIVE METABOLIC PANEL - Abnormal; Notable for the following components:   Glucose, Bld 169 (*)    BUN 34 (*)    Creatinine, Ser 3.28 (*)    Calcium 8.7 (*)    Total Protein 6.2 (*)    Albumin 3.0 (*)    AST 13 (*)    GFR calc non Af Amer 16 (*)    GFR calc Af Amer 18 (*)    All other components within normal limits  SARS CORONAVIRUS 2 (HOSPITAL ORDER, [place]  [facility])  POC URINE PREG, ED  TROPONIN I (HIGH SENSITIVITY)  TROPONIN I (HIGH SENSITIVITY)    EKG EKG Interpretation  Date/Time:  Sunday January 19 2019 10:05:11 EDT Ventricular Rate:  61 PR Interval:    QRS Duration: 85 QT Interval:  418 QTC Calculation: 421 R Axis:   2 Text Interpretation:  Sinus rhythm Confirmed by Milton Ferguson [phone]) on 01/19/2019 5:08:39 PM   Radiology Dg Chest 2 View  Result Date: 01/19/2019 CLINICAL DATA:  Shortness of breath, productive cough, and left-sided chest pain for 1 week. COPD. EXAM: CHEST - 2 VIEW COMPARISON:  08/07/2016 FINDINGS: The heart size and mediastinal contours are within normal limits. New patchy opacity is seen in the lateral right lung base, which could be due to atelectasis or pneumonia. Left lung is clear. No evidence of pleural effusion. IMPRESSION: New mild opacity in lateral right lung base, which may be due to atelectasis or pneumonia. Continued radiographic follow-up is recommended. Electronically Signed   By: Marlaine Hind M.D.   On: 01/19/2019 11:17   Ct Chest Wo Contrast  Result Date: 01/19/2019 CLINICAL  DATA:  Shortness of breath for 1 week. History of COPD. EXAM: CT CHEST WITHOUT CONTRAST TECHNIQUE: Multidetector CT imaging of the chest was performed following the standard protocol without IV contrast. COMPARISON:  Chest CT angiogram dated 04/12/2015. FINDINGS: Cardiovascular: Cardiomegaly. No significant pericardial effusion. No thoracic aortic aneurysm. The Mediastinum/Nodes: No mass or enlarged lymph nodes seen within the mediastinum. Esophagus is unremarkable. Trachea and central bronchi are unremarkable. Lungs/Pleura: Small bilateral pleural effusions with adjacent compressive atelectasis. Probable additional atelectasis at the inferior-lateral aspect of the RIGHT upper lobe. Diffuse interstitial thickening, bibasilar predominant, presumably interstitial edema. No additional consolidation or ground-glass opacity to suggest pneumonia. No pneumothorax. Upper Abdomen: Limited images of the upper abdomen are unremarkable. Musculoskeletal: No acute or suspicious osseous finding. Mild degenerative spondylosis at the thoracolumbar junction. IMPRESSION: 1. Cardiomegaly. 2. Small bilateral pleural effusions with adjacent compressive atelectasis. 3. Diffuse interstitial thickening, bibasilar predominant, presumably interstitial edema related to CHF/volume overload. In the absence of fever, atypical pneumonia is considered less likely. 4. No additional consolidation or ground-glass opacity to suggest pneumonia. Electronically Signed   By: Franki Cabot M.D.   On: 01/19/2019 14:14    Procedures Procedures (including critical care time)  Medications Ordered in ED Medications  nitroGLYCERIN (NITROSTAT) SL tablet 0.4 mg (has no administration in time range)  albuterol (VENTOLIN HFA) 108 (90 Base) MCG/ACT inhaler 4 puff (4 puffs Inhalation Given 01/19/19 1108)  methylPREDNISolone sodium succinate (SOLU-MEDROL) 125 mg/2 mL injection 125 mg (125 mg Intravenous Given 01/19/19 1108)  amLODipine (NORVASC) tablet 10 mg  (10 mg Oral Given 01/19/19 1435)  metoprolol tartrate (LOPRESSOR) tablet 50 mg (50 mg Oral Given 01/19/19 1437)  furosemide (LASIX) injection 40 mg (40 mg Intravenous Given 01/19/19 1435)  ipratropium-albuterol (DUONEB) 0.5-2.5 (3) MG/3ML nebulizer solution 3 mL (3 mLs Nebulization Given 01/19/19 1549)     Initial Impression / Assessment and Plan / ED Course  I have reviewed the triage vital signs and the nursing notes.  Pertinent labs & imaging results that were available during my care of the patient were reviewed by me and considered in my medical decision making (see chart for details).     HEAR Score: 4  CT chest completed given equivocal plain chest x-ray.  Patient has been afebrile and has normal WBC count, favoring new onset CHF over an infectious process.  She is comfortable with her breathing at rest  and she has a normal oxygen saturation on room air.  Patient was given IV Lasix and will ambulate patient to assess for shortness of breath/desaturation.  Pt has continued mild sob but does not desaturate with ambulation.  Mild tachypnea with ambulation (resp rate 24).  Persistent midsternal to left cp, normal ekg, Heart score 4,  serial troponins stable at 10 and 9, less likely unstable angina, new onset CHF per imaging and BNP elevation.  Will continue to monitor after lasix tx, consider dc home with lasix if sx improve.   4:43 PM.  Pt given albut/atrovent neb now that Covid resulted negative.  No sig improvement in sob, still no active wheezing.  Lasix given, has urinated x 3, still tachypneic with exertion. NTG SL ordered to assist with diuresis/bp improvement and to see if CP would improve.  Upper abd discomfort possibly from fluid abd fluid retention.   Discussed with Dr. Denton Brick who will see pt and plan admission.    Final Clinical Impressions(s) / ED Diagnoses   Final diagnoses:  Acute congestive heart failure, unspecified heart failure type [facility])    ED Discharge Orders     None       Landis Martins 01/19/19 1644    Evalee Jefferson, PA-C 01/19/19 1720    Fredia Sorrow, MD 01/20/19 1524

## 2019-01-19 NOTE — ED Triage Notes (Signed)
tient c/o shortness of breath x1 week. Per patient hx of COPD in which she uses x2 inhalers. Denies any fevers or swelling of extremities. Patient reports left side chest tightness and productive cough with thick whote sputum. Denies any cardiac hx.

## 2019-01-19 NOTE — ED Notes (Signed)
Notified Samantha Pierce, RT of [facility]

## 2019-01-19 NOTE — ED Notes (Signed)
Patient angry that provider mistook her for another patient. Used therapeutic communication with pt and explained that her original provider ended their shift. Pt speaking with pharmacy tech so I walked out of the room.

## 2019-01-19 NOTE — Plan of Care (Signed)
  Problem: Education: Goal: Knowledge of General Education information will improve Description Including pain rating scale, medication(s)/side effects and non-pharmacologic comfort measures Outcome: Progressing   Problem: Health Behavior/Discharge Planning: Goal: Ability to manage health-related needs will improve Outcome: Progressing   

## 2019-01-19 NOTE — H&P (Signed)
History and Physical    PATCHES MONTECINO [id number] DOB: 12-15-1970 DOA: 01/19/2019  PCP: Rosita Fire, MD   Patient coming from: Home  I have personally briefly reviewed patient's old medical records in [place]  Chief Complaint: Difficulty breathing, cough  HPI: Samantha Pierce is a 48 y.o. female with medical history significant for COPD, obesity with BMI of 43, bipolar disorder and depression, diabetes mellitus, CKD 4 and stroke.  Patient presented to the ED with complaints of 1 week of increasing difficulty breathing, with chest tightness and cough productive of thick white sputum.  She denies fever or chills.  Patient reports 10 pound weight gain over the past month, which she attributes to decreased activity level.  She reports weight gain around her abdomen.  Denies swelling of her legs. Patient also reports chest tightness and pain in the center of her chest radiating around to her back.  Describes been pulled from the center of her chest backwards.  She tells me she previously smoked 2 to 4 cigarettes daily, quit 2 months ago.  Patient has chronic kidney disease for which she follows with Dr. Lowanda Foster.  ED Course: O2 sats greater than 94% on room air.  Respiratory rate 18-29.  Blood pressure systolic 0000000 to [id number].  Heart rate 60s to 70s.  BNP 320 increased from prior.  Hs Troponin x2- 10 > 9.  EKG sinus rhythm without ST or T wave changes compared to prior.  UA with greater than 300 protein.  Creatinine 3.28, about baseline.  Two-view chest x-ray-suggested new mild opacity in lateral right lung base atelectasis versus pneumonia.  Subsequent CT chest without contrast-cardiomegaly, small pleural effusions, possible interstitial edema related to CHF/volume overload, less likely atypical pneumonia.Marland Kitchen  COVID-19 negative.  Patient given Solu-Medrol 125 in ED, Lasix 40 mg IV.  Hospitalist to admit for pulmonary edema.  Review of Systems: As per HPI all other systems reviewed and  negative.  Past Medical History:  Diagnosis Date   Anemia    Anxiety    Arthritis    left arm   Asystole ([place])    a. 05/2015: pt developed bradycardia->asystole during [place] nuclear stress test, s/p brief code. Cath with only 30% prox LCx. Her asystole during Lexiscan infusion was felt to represent an excessive pharmacologic response to the agent and likely superimposed vasovagal response.   Bipolar disorder ([place])    CKD (chronic kidney disease) stage 3, GFR 30-59 ml/min (HCC)    stage 4, not on dialysis   COPD (chronic obstructive pulmonary disease) (HCC)    Depression    Diabetes mellitus with diabetic retinopathy    type 2   Difficulty walking    Dyspnea    Essential hypertension    GERD (gastroesophageal reflux disease)    History of blood transfusion    History of stroke April 2016   Acute lacunar infarct of the left thalamus   History of stroke    Leg cramps    Mild CAD    a. LHC 05/2015: 30% prox Cx, otherwise widely patent.   Morbid obesity (HCC)    Neuropathy    both arms, feet   PTSD (post-traumatic stress disorder)    PTSD (post-traumatic stress disorder)    Seizures ([place])    has had one 12/26/15 - due to high blood sugar   Stroke ([place])    right arm weakness, decrease feeling in fingers right hand   TIA (transient ischemic attack)     Past Surgical History:  Procedure Laterality Date   AV FISTULA PLACEMENT Right 01/08/2017   Procedure: RIGHT ARM BRACHIOCEPHALIC ARTERIOVENOUS (AV) FISTULA CREATION;  Surgeon: Waynetta Sandy, MD;  Location: [facility];  Service: Vascular;  Laterality: Right;   AV FISTULA PLACEMENT Left 11/20/2018   Procedure: Creation of LEFT ARM ARTERIOVENOUS (AV) FISTULA;  Surgeon: Waynetta Sandy, MD;  Location: [facility];  Service: Vascular;  Laterality: Left;   CARDIAC CATHETERIZATION N/A 06/01/2015   Procedure: Left Heart Cath and Coronary Angiography;  Surgeon: Belva Crome, MD; CFX 30%, no other  dz, EF nl by echo   CESAREAN SECTION     x2   [place]     Right eye pars plano vitrectomy    LASIK     PARS PLANA VITRECTOMY  04/20/2011   Procedure: PARS PLANA VITRECTOMY WITH 25 GAUGE;  Surgeon: Hayden Pedro, MD;  Location: [facility];  Service: Ophthalmology;  Laterality: Left;  membrane peel, gas fluid exchange, endolaser, repair of complex retinal detachment   TUBAL LIGATION       reports that she has been smoking cigarettes. She started smoking about 37 years ago. She has a 0.90 pack-year smoking history. She has never used smokeless tobacco. She reports current alcohol use. She reports that she does not use drugs.  Allergies  Allergen Reactions   Contrast Media [Iodinated Diagnostic Agents] Anaphylaxis    "code blue--I died"   Penicillins Rash and Other (See Comments)    Has patient had a PCN reaction causing immediate rash, facial/tongue/throat swelling, SOB or lightheadedness with hypotension: Yes Has patient had a PCN reaction causing severe rash involving mucus membranes or skin necrosis: No Has patient had a PCN reaction that required hospitalization No Has patient had a PCN reaction occurring within the last 10 years: No If all of the above answers are "NO", then may proceed with Cephalosporin use.     Family History  Problem Relation Age of Onset   Diabetes Mother    Kidney failure Mother    Diabetes Father    Amblyopia Neg Hx    Blindness Neg Hx    Cataracts Neg Hx    Glaucoma Neg Hx    Macular degeneration Neg Hx    Retinal detachment Neg Hx    Strabismus Neg Hx    Retinitis pigmentosa Neg Hx     Prior to Admission medications   Medication Sig Start Date End Date Taking? Authorizing Provider  albuterol (PROVENTIL HFA;VENTOLIN HFA) 108 (90 BASE) MCG/ACT inhaler Inhale 1-2 puffs into the lungs every 6 (six) hours as needed for wheezing or shortness of breath. Patient taking differently: Inhale 1-2  puffs into the lungs every 6 (six) hours as needed for shortness of breath.  06/20/14  Yes Nat Christen, MD  amLODipine (NORVASC) 10 MG tablet Take 10 mg by mouth daily.   Yes [provider]  atorvastatin (LIPITOR) 80 MG tablet Take 1 tablet (80 mg total) by mouth daily at 6 PM. Patient taking differently: Take 80 mg by mouth daily.  08/05/15  Yes Satira Sark, MD  cloNIDine (CATAPRES) 0.2 MG tablet Take 0.2 mg by mouth 2 (two) times daily.    Yes [provider]  clopidogrel (PLAVIX) 75 MG tablet Take 1 tablet (75 mg total) by mouth daily. 05/24/15  Yes Hongalgi, Lenis Dickinson, MD  dorzolamide (TRUSOPT) 2 % ophthalmic solution Place 1 drop into both eyes 2 (two) times daily as needed (eye  pain.).    Yes [provider]  FLUoxetine (PROZAC) 20 MG capsule Take 40 mg by mouth daily.    Yes [provider]  furosemide (LASIX) 40 MG tablet Take 40 mg by mouth daily.    Yes [provider]  gabapentin (NEURONTIN) 300 MG capsule Take 300 mg by mouth at bedtime.  02/07/18  Yes [provider]  insulin aspart (NOVOLOG) 100 UNIT/ML FlexPen Inject 10 Units into the skin 3 (three) times daily before meals.    Yes [provider]  Insulin Detemir (LEVEMIR) 100 UNIT/ML Pen Inject 50 Units into the skin at bedtime. Patient taking differently: Inject 48-80 Units into the skin at bedtime. Prescribed 80 units at bedtime. Patient takes half dose if small meal is eaten at supper 07/30/18  Yes Nida, Marella Chimes, MD  losartan (COZAAR) 100 MG tablet Take 100 mg by mouth at bedtime. 10/11/18  Yes [provider]  metoprolol succinate (TOPROL-XL) 50 MG 24 hr tablet Take 50 mg by mouth daily. 10/11/18  Yes [provider]  omeprazole (PRILOSEC) 20 MG capsule Take 20 mg by mouth daily before breakfast.  02/07/18  Yes [provider]  sodium bicarbonate 650 MG tablet Take 650 mg by mouth 2 (two) times daily.  11/19/18  Yes [provider]   SYMBICORT 160-4.5 MCG/ACT inhaler Inhale 2 puffs into the lungs 2 (two) times daily.  02/07/18  Yes [provider]  tiotropium (SPIRIVA) 18 MCG inhalation capsule Place 18 mcg into inhaler and inhale 3 (three) times a week.    Yes [provider]  traZODone (DESYREL) 100 MG tablet Take 100 mg by mouth at bedtime.   Yes [provider]  oxyCODONE-acetaminophen (PERCOCET/ROXICET) 5-325 MG tablet Take 1 tablet by mouth every 6 (six) hours as needed. Patient not taking: Reported on 01/19/2019 11/20/18   Ulyses Amor, PA-C    Physical Exam: Vitals:   01/19/19 1615 01/19/19 1630 01/19/19 1724 01/19/19 1726  BP: (!) 171/76 (!) 192/64  (!) 141/56  Pulse: 67 63 66 76  Resp: 18 (!) 22 (!) 24 (!) 26  Temp:      TempSrc:      SpO2: 99% 100% 98% 95%  Weight:      Height:        Constitutional: Mild increased work of breathing Vitals:   01/19/19 1615 01/19/19 1630 01/19/19 1724 01/19/19 1726  BP: (!) 171/76 (!) 192/64  (!) 141/56  Pulse: 67 63 66 76  Resp: 18 (!) 22 (!) 24 (!) 26  Temp:      TempSrc:      SpO2: 99% 100% 98% 95%  Weight:      Height:       Eyes: PERRL, lids and conjunctivae normal ENMT: Mucous membranes are moist. Posterior pharynx clear of any exudate or lesions.  Neck: normal, supple, no masses, no thyromegaly Respiratory: clear to auscultation bilaterally, no wheezing, no crackles. Normal respiratory effort. No accessory muscle use.  Cardiovascular: Regular rate and rhythm, no murmurs / rubs / gallops. No extremity edema. 2+ pedal pulses.  Abdomen: Obese, soft, no tenderness, no masses palpated. No hepatosplenomegaly. Bowel sounds positive.  Musculoskeletal: no clubbing / cyanosis. No joint deformity upper and lower extremities. Good ROM, no contractures. Normal muscle tone.  Skin: no rashes, lesions, ulcers. No induration Neurologic: CN 2-12 grossly intact. Strength 5/5 in all 4.  Psychiatric: Normal judgment and insight. Alert and oriented  x 3. Normal mood.   Labs on Admission:  I have personally reviewed following labs and imaging studies  CBC: Recent Labs  Lab 01/19/19 1102  WBC 6.7  NEUTROABS 3.7  HGB 11.9*  HCT 39.0  MCV 84.6  PLT [id number]   Basic Metabolic Panel: Recent Labs  Lab 01/19/19 1102  NA 135  K 4.4  CL 104  CO2 24  GLUCOSE 169*  BUN 34*  CREATININE 3.28*  CALCIUM 8.7*   Liver Function Tests: Recent Labs  Lab 01/19/19 1102  AST 13*  ALT 18  ALKPHOS 54  BILITOT 0.7  PROT 6.2*  ALBUMIN 3.0*   Urine analysis:    Component Value Date/Time   COLORURINE YELLOW 01/19/2019 1031   APPEARANCEUR HAZY (A) 01/19/2019 1031   LABSPEC 1.013 01/19/2019 1031   PHURINE 6.0 01/19/2019 1031   GLUCOSEU 50 (A) 01/19/2019 1031   HGBUR NEGATIVE 01/19/2019 1031   [place] 01/19/2019 1031   [place] 01/19/2019 1031   PROTEINUR >=300 (A) 01/19/2019 1031   UROBILINOGEN 0.2 09/03/2014 2255   NITRITE NEGATIVE 01/19/2019 1031   LEUKOCYTESUR NEGATIVE 01/19/2019 1031    Radiological Exams on Admission: Dg Chest 2 View  Result Date: 01/19/2019 CLINICAL DATA:  Shortness of breath, productive cough, and left-sided chest pain for 1 week. COPD. EXAM: CHEST - 2 VIEW COMPARISON:  08/07/2016 FINDINGS: The heart size and mediastinal contours are within normal limits. New patchy opacity is seen in the lateral right lung base, which could be due to atelectasis or pneumonia. Left lung is clear. No evidence of pleural effusion. IMPRESSION: New mild opacity in lateral right lung base, which may be due to atelectasis or pneumonia. Continued radiographic follow-up is recommended. Electronically Signed   By: Marlaine Hind M.D.   On: 01/19/2019 11:17   Ct Chest Wo Contrast  Result Date: 01/19/2019 CLINICAL DATA:  Shortness of breath for 1 week. History of COPD. EXAM: CT CHEST WITHOUT CONTRAST TECHNIQUE: Multidetector CT imaging of the chest was performed following the standard protocol without IV contrast.  COMPARISON:  Chest CT angiogram dated 04/12/2015. FINDINGS: Cardiovascular: Cardiomegaly. No significant pericardial effusion. No thoracic aortic aneurysm. The Mediastinum/Nodes: No mass or enlarged lymph nodes seen within the mediastinum. Esophagus is unremarkable. Trachea and central bronchi are unremarkable. Lungs/Pleura: Small bilateral pleural effusions with adjacent compressive atelectasis. Probable additional atelectasis at the inferior-lateral aspect of the RIGHT upper lobe. Diffuse interstitial thickening, bibasilar predominant, presumably interstitial edema. No additional consolidation or ground-glass opacity to suggest pneumonia. No pneumothorax. Upper Abdomen: Limited images of the upper abdomen are unremarkable. Musculoskeletal: No acute or suspicious osseous finding. Mild degenerative spondylosis at the thoracolumbar junction. IMPRESSION: 1. Cardiomegaly. 2. Small bilateral pleural effusions with adjacent compressive atelectasis. 3. Diffuse interstitial thickening, bibasilar predominant, presumably interstitial edema related to CHF/volume overload. In the absence of fever, atypical pneumonia is considered less likely. 4. No additional consolidation or ground-glass opacity to suggest pneumonia. Electronically Signed   By: Franki Cabot M.D.   On: 01/19/2019 14:14    EKG: Independently reviewed.  Sinus rhythm, rate 61.  QTc 421.  No significant ST or T wave abnormalities compared to prior EKG.  Assessment/Plan Active Problems:   Dyspnea    Pulmonary edema- dyspnea, productive cough, reports 10 pound weight gain  (per charts weights without significant change).  On room air. BNP elevated 320 from 78, 3 years ago(patient is obese).  CT chest shows small bilateral pleural effusion, interstitial edema, less likely atypical pneumonia.  Afebrile without leukocytosis.  Home medications Lasix 40 daily.  Last echo 2016  EF 60 to 65%, G1DD. -Continue IV Lasix 40 every 12 hourly -Strict input output,  daily weights -Echocardiogram -BMP a.m.  Chest Tightness/pain-musculoskeletal related to dyspnea and cough.  EKG unchanged.  Hs Troponin x2-  10 > 9.  Cardiac cath 2016-widely patent coronary arteries. -Diuresis, resume statins, Metoprolol.  Diabetes mellitus-random glucose 169. -Resume home Levemir at reduced dose 40 units nightly - SSI- M -Resume home gabapentin   Hypertension-elevated.  Systolic in [id number].  Patient ran out of amlodipine and metoprolol. -Resume home amlodipine 10 mg, clonidine 0.2 twice daily, losartan 100, metoprolol 50 daily. -PRN hydralazine  CKD 4-creatinine 3.28, baseline 0 3.2-3.9.  Follows with Dr. Lowanda Foster. -Diuresis  Morbid obesity-BMI 13.  Complicates overall care.  Depression, bipolar disorder-  -Resume home fluoxetine  COPD-dyspnea without wheezing. 125 mg Solu-Medrol given in ED. -PRN duo nebs  DVT prophylaxis: Heparin Code Status: Full Family Communication: None at bedside Disposition Plan: 1 to 2 days Consults called: None Admission status: Observation, telemetry   Samantha Roys MD [facility]  01/19/2019, 6:19 PM

## 2019-01-19 NOTE — ED Notes (Signed)
Pt ambulated well. O2 sats stayed in between 96 and 100%. Pt stated she felt SOB.

## 2019-01-19 NOTE — ED Notes (Signed)
ED Provider at bedside. 

## 2019-01-19 NOTE — ED Notes (Signed)
Patient requesting to speak with the provider. MD notified.

## 2019-01-20 ENCOUNTER — Other Ambulatory Visit (HOSPITAL_COMMUNITY): Payer: Self-pay | Admitting: *Deleted

## 2019-01-20 ENCOUNTER — Observation Stay (HOSPITAL_COMMUNITY): Payer: Medicare HMO

## 2019-01-20 DIAGNOSIS — F4310 Post-traumatic stress disorder, unspecified: Secondary | ICD-10-CM | POA: Diagnosis present

## 2019-01-20 DIAGNOSIS — E1140 Type 2 diabetes mellitus with diabetic neuropathy, unspecified: Secondary | ICD-10-CM | POA: Diagnosis present

## 2019-01-20 DIAGNOSIS — E782 Mixed hyperlipidemia: Secondary | ICD-10-CM | POA: Diagnosis not present

## 2019-01-20 DIAGNOSIS — I10 Essential (primary) hypertension: Secondary | ICD-10-CM

## 2019-01-20 DIAGNOSIS — F1410 Cocaine abuse, uncomplicated: Secondary | ICD-10-CM

## 2019-01-20 DIAGNOSIS — D631 Anemia in chronic kidney disease: Secondary | ICD-10-CM | POA: Diagnosis present

## 2019-01-20 DIAGNOSIS — Z833 Family history of diabetes mellitus: Secondary | ICD-10-CM | POA: Diagnosis not present

## 2019-01-20 DIAGNOSIS — Z880 Allergy status to penicillin: Secondary | ICD-10-CM | POA: Diagnosis not present

## 2019-01-20 DIAGNOSIS — E1122 Type 2 diabetes mellitus with diabetic chronic kidney disease: Secondary | ICD-10-CM | POA: Diagnosis present

## 2019-01-20 DIAGNOSIS — E11319 Type 2 diabetes mellitus with unspecified diabetic retinopathy without macular edema: Secondary | ICD-10-CM | POA: Diagnosis present

## 2019-01-20 DIAGNOSIS — I69331 Monoplegia of upper limb following cerebral infarction affecting right dominant side: Secondary | ICD-10-CM | POA: Diagnosis not present

## 2019-01-20 DIAGNOSIS — R0602 Shortness of breath: Secondary | ICD-10-CM | POA: Diagnosis present

## 2019-01-20 DIAGNOSIS — I371 Nonrheumatic pulmonary valve insufficiency: Secondary | ICD-10-CM

## 2019-01-20 DIAGNOSIS — Z7951 Long term (current) use of inhaled steroids: Secondary | ICD-10-CM | POA: Diagnosis not present

## 2019-01-20 DIAGNOSIS — F319 Bipolar disorder, unspecified: Secondary | ICD-10-CM | POA: Diagnosis present

## 2019-01-20 DIAGNOSIS — Z91041 Radiographic dye allergy status: Secondary | ICD-10-CM | POA: Diagnosis not present

## 2019-01-20 DIAGNOSIS — E785 Hyperlipidemia, unspecified: Secondary | ICD-10-CM | POA: Diagnosis present

## 2019-01-20 DIAGNOSIS — I5031 Acute diastolic (congestive) heart failure: Secondary | ICD-10-CM

## 2019-01-20 DIAGNOSIS — I130 Hypertensive heart and chronic kidney disease with heart failure and stage 1 through stage 4 chronic kidney disease, or unspecified chronic kidney disease: Secondary | ICD-10-CM | POA: Diagnosis present

## 2019-01-20 DIAGNOSIS — Z794 Long term (current) use of insulin: Secondary | ICD-10-CM | POA: Diagnosis not present

## 2019-01-20 DIAGNOSIS — Z6841 Body Mass Index (BMI) 40.0 and over, adult: Secondary | ICD-10-CM | POA: Diagnosis not present

## 2019-01-20 DIAGNOSIS — N184 Chronic kidney disease, stage 4 (severe): Secondary | ICD-10-CM | POA: Diagnosis present

## 2019-01-20 DIAGNOSIS — Z20828 Contact with and (suspected) exposure to other viral communicable diseases: Secondary | ICD-10-CM | POA: Diagnosis present

## 2019-01-20 DIAGNOSIS — K219 Gastro-esophageal reflux disease without esophagitis: Secondary | ICD-10-CM | POA: Diagnosis present

## 2019-01-20 DIAGNOSIS — E1165 Type 2 diabetes mellitus with hyperglycemia: Secondary | ICD-10-CM

## 2019-01-20 DIAGNOSIS — Z720 Tobacco use: Secondary | ICD-10-CM

## 2019-01-20 DIAGNOSIS — N179 Acute kidney failure, unspecified: Secondary | ICD-10-CM | POA: Diagnosis present

## 2019-01-20 DIAGNOSIS — Z841 Family history of disorders of kidney and ureter: Secondary | ICD-10-CM | POA: Diagnosis not present

## 2019-01-20 DIAGNOSIS — Z7902 Long term (current) use of antithrombotics/antiplatelets: Secondary | ICD-10-CM | POA: Diagnosis not present

## 2019-01-20 DIAGNOSIS — E7800 Pure hypercholesterolemia, unspecified: Secondary | ICD-10-CM | POA: Diagnosis present

## 2019-01-20 DIAGNOSIS — I2510 Atherosclerotic heart disease of native coronary artery without angina pectoris: Secondary | ICD-10-CM | POA: Diagnosis present

## 2019-01-20 LAB — BASIC METABOLIC PANEL
Anion gap: 9 (ref 5–15)
BUN: 43 mg/dL — ABNORMAL HIGH (ref 6–20)
CO2: 21 mmol/L — ABNORMAL LOW (ref 22–32)
Calcium: 8.5 mg/dL — ABNORMAL LOW (ref 8.9–10.3)
Chloride: 102 mmol/L (ref 98–111)
Creatinine, Ser: 3.41 mg/dL — ABNORMAL HIGH (ref 0.44–1.00)
GFR calc Af Amer: 18 mL/min — ABNORMAL LOW (ref >60)
GFR calc non Af Amer: 15 mL/min — ABNORMAL LOW (ref >60)
Glucose, Bld: 340 mg/dL — ABNORMAL HIGH (ref 70–99)
Potassium: 4.6 mmol/L (ref 3.5–5.1)
Sodium: 132 mmol/L — ABNORMAL LOW (ref 135–145)

## 2019-01-20 LAB — RAPID URINE DRUG SCREEN, HOSP PERFORMED
Amphetamines: NOT DETECTED
Barbiturates: NOT DETECTED
Benzodiazepines: NOT DETECTED
Cocaine: POSITIVE — AB
Opiates: NOT DETECTED
Tetrahydrocannabinol: POSITIVE — AB

## 2019-01-20 LAB — GLUCOSE, CAPILLARY
Glucose-Capillary: 268 mg/dL — ABNORMAL HIGH (ref 70–99)
Glucose-Capillary: 285 mg/dL — ABNORMAL HIGH (ref 70–99)
Glucose-Capillary: 359 mg/dL — ABNORMAL HIGH (ref 70–99)
Glucose-Capillary: 173 mg/dL — ABNORMAL HIGH (ref 70–99)

## 2019-01-20 LAB — ECHOCARDIOGRAM COMPLETE
Height: 64 in
Weight: 3985.92 oz

## 2019-01-20 MED ORDER — INSULIN ASPART 100 UNIT/ML ~~LOC~~ SOLN
0.0000 [IU] | Freq: Three times a day (TID) | SUBCUTANEOUS | Status: DC
  Administered 2019-01-20: 17:00:00 20 [IU] via SUBCUTANEOUS
  Administered 2019-01-20: 11 [IU] via SUBCUTANEOUS
  Administered 2019-01-21 (×2): 4 [IU] via SUBCUTANEOUS

## 2019-01-20 MED ORDER — INSULIN ASPART 100 UNIT/ML ~~LOC~~ SOLN
5.0000 [IU] | Freq: Three times a day (TID) | SUBCUTANEOUS | Status: DC
  Administered 2019-01-20 – 2019-01-21 (×3): 5 [IU] via SUBCUTANEOUS

## 2019-01-20 MED ORDER — HYDRALAZINE HCL 20 MG/ML IJ SOLN: 10.0000 mg | Freq: Four times a day (QID) | INTRAMUSCULAR | Status: DC | PRN

## 2019-01-20 MED ORDER — INSULIN DETEMIR 100 UNIT/ML ~~LOC~~ SOLN
50.0000 [IU] | Freq: Every day | SUBCUTANEOUS | Status: DC
  Administered 2019-01-20: 50 [IU] via SUBCUTANEOUS
  Filled 2019-01-20 (×2): qty 0.5

## 2019-01-20 MED ORDER — FUROSEMIDE 40 MG PO TABS: 40.0000 mg | ORAL_TABLET | Freq: Every day | ORAL | Status: DC

## 2019-01-20 MED ORDER — INSULIN ASPART 100 UNIT/ML ~~LOC~~ SOLN: 0.0000 [IU] | Freq: Every day | SUBCUTANEOUS | Status: DC

## 2019-01-20 NOTE — Progress Notes (Signed)
Inpatient Diabetes Program Recommendations  AACE/ADA: New Consensus Statement on Inpatient Glycemic Control (2015)  Target Ranges:  Prepandial:   less than 140 mg/dL      Peak postprandial:   less than 180 mg/dL (1-2 hours)      Critically ill patients:  140 - 180 mg/dL   Lab Results  Component Value Date   GLUCAP 285 (H) 01/20/2019   HGBA1C 9.8 (H) 01/19/2019    Review of Glycemic Control Results for Samantha Pierce, Samantha Pierce (MRN [id number]) as of 01/20/2019 14:27  Ref. Range 01/19/2019 21:11 01/20/2019 07:49 01/20/2019 11:40  Glucose-Capillary Latest Ref Range: 70 - 99 mg/dL 314 (H) 268 (H) 285 (H)   Diabetes history: DM 2 Outpatient Diabetes medications: Levemir 48-80 units q HS (patient taking Levemir 50 units q HS), Humalog 10 units tid with meals Current orders for Inpatient glycemic control:  Levemir 50 units q HS, Novolog resistant tid with meals and HS  Inpatient Diabetes Program Recommendations:    Please reduce Novolog correction to moderate tid with meals.  Also consider adding Novolog 6 units tid with meals.  A1C>goal.  Per MD's note, patient not taking insulin consistently.  Will follow-up on 8/18.  Thanks,  Adah Perl, RN, BC-ADM Inpatient Diabetes Coordinator Pager [phone] (8a-5p)

## 2019-01-20 NOTE — TOC Initial Note (Signed)
Transition of Care [facility]) - Initial/Assessment Note    Patient Details  Name: Samantha Pierce MRN: [id number] Date of Birth: [birth year]  Transition of [facility]) CM/SW Contact:    Shade Flood, LCSW Phone Number: 01/20/2019, 11:20 AM  Clinical Narrative:                  Pt admitted from home. Received CSW consult to see pt at her request. Spoke with pt today to assess. Per pt, she wanted to speak with CSW because she has been feeling depressed. She denies suicidal thoughts or plan at this time though she states that since the pandemic started, she has occasionally had suicidal thoughts with no plan. Emotional support provided to pt.  Pt does take Prozac and Trazadone. She states that she used to receive outpatient counseling services at [facility] but that about a year ago, she was approved for disability and she now has Medicare with Medicaid secondary and she can no longer go to [facility]. Pt asking about an alternative mental health provider. Discussed the option of [facility] referral. Pt agreeable. Will initiate referral.  Pt states she has a history of PTSD from being in [place] of the [place] on 9/11 and that she feels now like she felt after that. Pt also states that even with her Medicaid, she cannot always afford to pay for all her medications. She states that she has been picking and choosing which ones to fill. Per pt, she now gets her medicine at [facility] and she is working with someone to see if she can qualify for medication assistance programs with the manufacturers.   Per pt, she is diagnosed with bipolar disorder and depression. She is legally blind in her R eye. Pt would likely benefit from [facility] RN and CSW at the time of dc.  TOC will follow and assist with pt's support and dc planning needs.  Expected Discharge Plan: [place] Barriers to Discharge: Continued Medical Work up   Patient Goals and CMS  Choice Patient states their goals for this hospitalization and ongoing recovery are:: Get re-connected with mental health care CMS Medicare.gov Compare Post Acute Care list provided to:: Patient Choice offered to / list presented to : Patient  Expected Discharge Plan and Services Expected Discharge Plan: [place] In-house Referral: Clinical Social Work   Post Acute Care Choice: [place] arrangements for the past 2 months: [place]                                      Prior Living Arrangements/Services Living arrangements for the past 2 months: Single Family Home Lives with:: Minor Children Patient language and need for interpreter reviewed:: Yes Do you feel safe going back to the place where you live?: Yes      Need for Family Participation in Patient Care: No (Comment) Care giver support system in place?: Yes (comment)   Criminal Activity/Legal Involvement Pertinent to Current Situation/Hospitalization: No - Comment as needed  Activities of Daily Living Home Assistive Devices/Equipment: None ADL Screening (condition at time of admission) Patient's cognitive ability adequate to safely complete daily activities?: Yes Is the patient deaf or have difficulty hearing?: No Does the patient have difficulty seeing, even when wearing glasses/contacts?: Yes(legally blind right eye) Does the patient have difficulty concentrating, remembering, or making decisions?:  No Patient able to express need for assistance with ADLs?: Yes Does the patient have difficulty dressing or bathing?: No Independently performs ADLs?: Yes (appropriate for developmental age) Does the patient have difficulty walking or climbing stairs?: No Weakness of Legs: Both Weakness of Arms/Hands: None  Permission Sought/Granted                  Emotional Assessment Appearance:: Appears stated age Attitude/Demeanor/Rapport: Engaged, Gracious Affect (typically  observed): Calm, Pleasant Orientation: : Oriented to Self, Oriented to Place, Oriented to  Time, Oriented to Situation Alcohol / Substance Use: Tobacco Use Psych Involvement: No (comment)  Admission diagnosis:  Acute congestive heart failure, unspecified heart failure type [facility]) [I50.9] Patient Active Problem List   Diagnosis Date Noted  . Tobacco abuse 01/20/2019  . Cocaine abuse ([place]) 01/20/2019  . Uncontrolled type 2 diabetes mellitus with hyperglycemia, with long-term current use of insulin ([place]) 01/20/2019  . Acute diastolic CHF (congestive heart failure) ([place]) 01/20/2019  . Dyspnea 01/19/2019  . AKI (acute kidney injury) ([place]) 11/19/2017  . COPD (chronic obstructive pulmonary disease) ([place]) 11/19/2017  . GERD (gastroesophageal reflux disease) 11/19/2017  . Mild CAD 06/02/2015  . Bradycardia 06/02/2015  . Morbid obesity ([place])   . Asystole ([place])   . Pain in the chest   . Hyperlipidemia   . TIA (transient ischemic attack) 05/22/2015  . Diastolic dysfunction [id number]  . Essential hypertension 09/04/2014  . Uncontrolled diabetes mellitus with stage 4 chronic kidney disease ([place]) 09/04/2014  . Lacunar stroke, acute ([place]) 09/04/2014  . Paresthesias/numbness 09/04/2014  . CKD (chronic kidney disease), stage III ([place]) 09/04/2014  . Obesity 09/04/2014  . CVA (cerebral infarction) 09/04/2014  . Proliferative diabetic retinopathy of both eyes ([place]) 04/11/2011  . Retinal detachment, traction 04/11/2011   PCP:  Rosita Fire, MD Pharmacy:   [place], [place] [address] Phone: [phone] Fax: [place], [place] [address] Phone: [phone] Fax: [phone]     Social Determinants of Health (SDOH) Interventions    Readmission Risk Interventions Readmission Risk Prevention Plan 01/20/2019  Transportation Screening  Complete  HRI or [place] Complete  Social Work Consult for [place] Planning/Counseling Complete  Palliative Care Screening Not Applicable  Medication Review Press photographer) Complete  Some recent data might be hidden

## 2019-01-20 NOTE — Progress Notes (Signed)
*  PRELIMINARY RESULTS* Echocardiogram 2D Echocardiogram has been performed.  Samantha Pierce 01/20/2019, 9:38 AM

## 2019-01-20 NOTE — Progress Notes (Signed)
PROGRESS NOTE  Samantha Pierce [id number] DOB: [birth year] DOA: 01/19/2019 PCP: Rosita Fire, MD  Brief History:  48 year old female with a history of bipolar disorder, diabetes mellitus type 2, CKD stage IV, CAD, stroke, hypertension, COPD, and tobacco abuse presenting with 1 week history of shortness of breath and intermittent chest discomfort.  Patient initially thought it was related to her COPD.  She started using inhalers without much relief.  She noted increasing abdominal girth.  She stated that she had occasional substernal chest discomfort usually at rest but occasionally with exertion.  She denied any fever, chills, nausea, vomiting, diarrhea,, pain, coughing, hemoptysis.  She has noted increasing weight gain and increasing abdominal girth.  She had some orthopnea type symptoms.  Notably, the patient states that she smoked cocaine and marijuana approximately 3 to 4 days prior to this admission.  In addition, the patient states that she has not been completely compliant with any of her other medications.  She states that she is only been taking half of her prescribed Levemir, and states that there are many days during the week where she does not take it. In the emergency department, the patient was afebrile hemodynamically stable saturating 97% on room air.  Chest x-ray showed vascular congestion, and possible right lower lobe opacity.  CT of the chest showed small bilateral pleural effusions with diffuse interstitial thickening predominantly bibasilar presumably from interstitial edema.  The patient was started on intravenous furosemide.  Assessment/Plan: Acute diastolic CHF -Continue IV furosemide -She remains mildly fluid overloaded -Accurate I's and O's -Echocardiogram  CKD stage IV -Baseline creatinine 3.2-3.3 -will need to tolerate slight worsening of renal function for improved respiratory status -A.m. BMP -Monitor closely with diuresis -Discontinue  losartan -Patient had first stage of left basilic vein fistula on 0000000 -Continue bicarbonate  Diabetes mellitus type 2, uncontrolled with hyperglycemia -01/19/2019 hemoglobin A1c 9.8 -Increase Levemir to 50 units -Increased to resistant sliding scale insulin  Polysubstance abuse -Including cocaine, THC, tobacco -cessation discussed   Essential hypertension -Continue metoprolol succinate, amlodipine, clonidine  Hyperlipidemia -Continue statin  Morbid obesity -BMI 42.76 -Lifestyle modification  COPD -Stable without exacerbation       Disposition Plan:   Home in 1-2 days  Family Communication:  No Family at bedside  Consultants:  none  Code Status:  FULL  DVT Prophylaxis:  [place] Heparin   Procedures: As Listed in Progress Note Above  Antibiotics: None       Subjective: Patient states that she is breathing better but not yet back to baseline.  She still has some dyspnea to mild exertion.  She denies any chest pain, headache, coughing, hemoptysis, nausea, vomiting, diarrhea, abdominal pain.  There is no dysuria hematuria.  There is no hematochezia or melena    Objective: Vitals:   01/19/19 2115 01/20/19 0555 01/20/19 0755 01/20/19 0832  BP: (!) 146/88 (!) 148/66  (!) 156/72  Pulse: 71 (!) 58  62  Resp: (!) 22 (!) 22    Temp: 98 F (36.7 C) 98.2 F (36.8 C)    TempSrc: Oral Oral    SpO2: 100% 97% 98%   Weight:  113 kg    Height:       No intake or output data in the 24 hours ending 01/20/19 0927 Weight change:  Exam:   General:  Pt is alert, follows commands appropriately, not in acute distress  HEENT: No icterus, No thrush, No neck mass, /AT  Cardiovascular:  RRR, S1/S2, no rubs, no gallops  Respiratory: Bibasilar crackles.  No wheezing.  Good air movement  Abdomen: Soft/+BS, non tender, non distended, no guarding  Extremities: 1 + LE edema, No lymphangitis, No petechiae, No rashes, no synovitis   Data Reviewed: I have personally  reviewed following labs and imaging studies Basic Metabolic Panel: Recent Labs  Lab 01/19/19 1102 01/20/19 0436  NA 135 132*  K 4.4 4.6  CL 104 102  CO2 24 21*  GLUCOSE 169* 340*  BUN 34* 43*  CREATININE 3.28* 3.41*  CALCIUM 8.7* 8.5*   Liver Function Tests: Recent Labs  Lab 01/19/19 1102  AST 13*  ALT 18  ALKPHOS 54  BILITOT 0.7  PROT 6.2*  ALBUMIN 3.0*   No results for input(s): LIPASE, AMYLASE in the last 168 hours. No results for input(s): AMMONIA in the last 168 hours. Coagulation Profile: No results for input(s): INR, PROTIME in the last 168 hours. CBC: Recent Labs  Lab 01/19/19 1102  WBC 6.7  NEUTROABS 3.7  HGB 11.9*  HCT 39.0  MCV 84.6  PLT 270   Cardiac Enzymes: No results for input(s): CKTOTAL, CKMB, CKMBINDEX, TROPONINI in the last 168 hours. BNP: Invalid input(s): POCBNP CBG: Recent Labs  Lab 01/19/19 2111 01/20/19 0749  GLUCAP 314* 268*   HbA1C: Recent Labs    01/19/19 1100  HGBA1C 9.8*   Urine analysis:    Component Value Date/Time   COLORURINE YELLOW 01/19/2019 1031   APPEARANCEUR HAZY (A) 01/19/2019 1031   LABSPEC 1.013 01/19/2019 1031   PHURINE 6.0 01/19/2019 1031   GLUCOSEU 50 (A) 01/19/2019 1031   HGBUR NEGATIVE 01/19/2019 1031   BILIRUBINUR NEGATIVE 01/19/2019 1031   KETONESUR NEGATIVE 01/19/2019 1031   PROTEINUR >=300 (A) 01/19/2019 1031   UROBILINOGEN 0.2 09/03/2014 2255   NITRITE NEGATIVE 01/19/2019 1031   LEUKOCYTESUR NEGATIVE 01/19/2019 1031   Sepsis Labs: @LABRCNTIP (procalcitonin:4,lacticidven:4) ) Recent Results (from the past 240 hour(s))  SARS Coronavirus 2 [facility] order, Performed in [facility] hospital lab) Nasopharyngeal Nasopharyngeal Swab     Status: None   Collection Time: 01/19/19 11:14 AM   Specimen: Nasopharyngeal Swab  Result Value Ref Range Status   SARS Coronavirus 2 NEGATIVE NEGATIVE Final    Comment: (NOTE) If result is NEGATIVE SARS-CoV-2 target nucleic acids are NOT DETECTED. The  SARS-CoV-2 RNA is generally detectable in upper and lower  respiratory specimens during the acute phase of infection. The lowest  concentration of SARS-CoV-2 viral copies this assay can detect is 250  copies / mL. A negative result does not preclude SARS-CoV-2 infection  and should not be used as the sole basis for treatment or other  patient management decisions.  A negative result may occur with  improper specimen collection / handling, submission of specimen other  than nasopharyngeal swab, presence of viral mutation(s) within the  areas targeted by this assay, and inadequate number of viral copies  (<250 copies / mL). A negative result must be combined with clinical  observations, patient history, and epidemiological information. If result is POSITIVE SARS-CoV-2 target nucleic acids are DETECTED. The SARS-CoV-2 RNA is generally detectable in upper and lower  respiratory specimens dur ing the acute phase of infection.  Positive  results are indicative of active infection with SARS-CoV-2.  Clinical  correlation with patient history and other diagnostic information is  necessary to determine patient infection status.  Positive results do  not rule out bacterial infection or co-infection with other viruses. If result is PRESUMPTIVE POSTIVE SARS-CoV-2 nucleic  acids MAY BE PRESENT.   A presumptive positive result was obtained on the submitted specimen  and confirmed on repeat testing.  While 2019 novel coronavirus  (SARS-CoV-2) nucleic acids may be present in the submitted sample  additional confirmatory testing may be necessary for epidemiological  and / or clinical management purposes  to differentiate between  SARS-CoV-2 and other Sarbecovirus currently known to infect humans.  If clinically indicated additional testing with an alternate test  methodology [phone]) is advised. The SARS-CoV-2 RNA is generally  detectable in upper and lower respiratory sp ecimens during the acute   phase of infection. The expected result is Negative. Fact Sheet for Patients:  StrictlyIdeas.no Fact Sheet for Healthcare Providers: BankingDealers.co.za This test is not yet approved or cleared by the [place] FDA and has been authorized for detection and/or diagnosis of SARS-CoV-2 by FDA under an Emergency Use Authorization (EUA).  This EUA will remain in effect (meaning this test can be used) for the duration of the COVID-19 declaration under Section 564(b)(1) of the Act, 21 U.S.C. section 360bbb-3(b)(1), unless the authorization is terminated or revoked sooner. Performed at [facility], [address], [place], [place] [address]      Scheduled Meds:  amLODipine  10 mg Oral Daily   atorvastatin  80 mg Oral Daily   cloNIDine  0.2 mg Oral BID   clopidogrel  75 mg Oral Daily   FLUoxetine  40 mg Oral Daily   furosemide  40 mg Intravenous Q12H   gabapentin  300 mg Oral QHS   heparin injection (subcutaneous)  5,000 Units Subcutaneous Q8H   insulin aspart  0-15 Units Subcutaneous TID WC   insulin detemir  40 Units Subcutaneous QHS   losartan  100 mg Oral QHS   metoprolol succinate  50 mg Oral Daily   mometasone-formoterol  2 puff Inhalation BID   pantoprazole  40 mg Oral Daily   sodium bicarbonate  650 mg Oral BID   tiotropium  18 mcg Inhalation 3 times weekly   traZODone  100 mg Oral QHS   Continuous Infusions:  Procedures/Studies: Dg Chest 2 View  Result Date: 01/19/2019 CLINICAL DATA:  Shortness of breath, productive cough, and left-sided chest pain for 1 week. COPD. EXAM: CHEST - 2 VIEW COMPARISON:  08/07/2016 FINDINGS: The heart size and mediastinal contours are within normal limits. New patchy opacity is seen in the lateral right lung base, which could be due to atelectasis or pneumonia. Left lung is clear. No evidence of pleural effusion. IMPRESSION: New mild opacity in lateral right lung base, which may  be due to atelectasis or pneumonia. Continued radiographic follow-up is recommended. Electronically Signed   By: Marlaine Hind M.D.   On: 01/19/2019 11:17   Ct Chest Wo Contrast  Result Date: 01/19/2019 CLINICAL DATA:  Shortness of breath for 1 week. History of COPD. EXAM: CT CHEST WITHOUT CONTRAST TECHNIQUE: Multidetector CT imaging of the chest was performed following the standard protocol without IV contrast. COMPARISON:  Chest CT angiogram dated 04/12/2015. FINDINGS: Cardiovascular: Cardiomegaly. No significant pericardial effusion. No thoracic aortic aneurysm. The Mediastinum/Nodes: No mass or enlarged lymph nodes seen within the mediastinum. Esophagus is unremarkable. Trachea and central bronchi are unremarkable. Lungs/Pleura: Small bilateral pleural effusions with adjacent compressive atelectasis. Probable additional atelectasis at the inferior-lateral aspect of the RIGHT upper lobe. Diffuse interstitial thickening, bibasilar predominant, presumably interstitial edema. No additional consolidation or ground-glass opacity to suggest pneumonia. No pneumothorax. Upper Abdomen: Limited images of the upper abdomen are unremarkable. Musculoskeletal:  No acute or suspicious osseous finding. Mild degenerative spondylosis at the thoracolumbar junction. IMPRESSION: 1. Cardiomegaly. 2. Small bilateral pleural effusions with adjacent compressive atelectasis. 3. Diffuse interstitial thickening, bibasilar predominant, presumably interstitial edema related to CHF/volume overload. In the absence of fever, atypical pneumonia is considered less likely. 4. No additional consolidation or ground-glass opacity to suggest pneumonia. Electronically Signed   By: Franki Cabot M.D.   On: 01/19/2019 14:14   Vas US Duplex Dialysis Access (avf,avg)  Result Date: 01/17/2019 DIALYSIS ACCESS Reason for Exam: Routine follow up. Access Site: Left Upper Extremity. Access Type: Basilic vein transposition. History: Creation of left arm  basilic vein transposition on 11/20/2018. Performing Technologist: Delorise Shiner RVT  Examination Guidelines: A complete evaluation includes B-mode imaging, spectral Doppler, color Doppler, and power Doppler as needed of all accessible portions of each vessel. Unilateral testing is considered an integral part of a complete examination. Limited examinations for reoccurring indications may be performed as noted.  Findings: +--------------------+----------+-----------------+--------+  AVF                  PSV (cm/s) Flow Vol (mL/min) Comments  +--------------------+----------+-----------------+--------+  Native artery inflow    436           2733                  +--------------------+----------+-----------------+--------+  AVF Anastomosis         648                                 +--------------------+----------+-----------------+--------+  +------------+----------+-------------+----------+--------+  OUTFLOW VEIN PSV (cm/s) Diameter (cm) Depth (cm) Describe  +------------+----------+-------------+----------+--------+  Prox UA         193         1.08         1.79              +------------+----------+-------------+----------+--------+  Mid UA          216         1.15         1.42              +------------+----------+-------------+----------+--------+  Dist UA         162         1.15         1.36              +------------+----------+-------------+----------+--------+  AC Fossa        200         0.97         1.43              +------------+----------+-------------+----------+--------+   Summary: Patent arteriovenous fistula. *See table(s) above for measurements and observations.  Diagnosing physician: Curt Jews MD   --------------------------------------------------------------------------------   Preliminary     Orson Eva, DO  [facility] Pager [phone]  If 7PM-7AM, please contact night-coverage [email or web address] Password [email or web address] 01/20/2019, 9:27 AM   LOS: 0 days

## 2019-01-21 LAB — GLUCOSE, CAPILLARY
Glucose-Capillary: 173 mg/dL — ABNORMAL HIGH (ref 70–99)
Glucose-Capillary: 185 mg/dL — ABNORMAL HIGH (ref 70–99)

## 2019-01-21 LAB — BASIC METABOLIC PANEL
Anion gap: 10 (ref 5–15)
BUN: 49 mg/dL — ABNORMAL HIGH (ref 6–20)
CO2: 20 mmol/L — ABNORMAL LOW (ref 22–32)
Calcium: 8.3 mg/dL — ABNORMAL LOW (ref 8.9–10.3)
Chloride: 104 mmol/L (ref 98–111)
Creatinine, Ser: 3.87 mg/dL — ABNORMAL HIGH (ref 0.44–1.00)
GFR calc Af Amer: 15 mL/min — ABNORMAL LOW (ref >60)
GFR calc non Af Amer: 13 mL/min — ABNORMAL LOW (ref >60)
Glucose, Bld: 171 mg/dL — ABNORMAL HIGH (ref 70–99)
Potassium: 4.5 mmol/L (ref 3.5–5.1)
Sodium: 134 mmol/L — ABNORMAL LOW (ref 135–145)

## 2019-01-21 LAB — HIV ANTIBODY (ROUTINE TESTING W REFLEX): HIV Screen 4th Generation wRfx: NONREACTIVE

## 2019-01-21 LAB — MAGNESIUM: Magnesium: 2.1 mg/dL (ref 1.7–2.4)

## 2019-01-21 MED ORDER — HYDRALAZINE HCL 25 MG PO TABS: 25.0000 mg | ORAL_TABLET | Freq: Two times a day (BID) | ORAL | 1 refills | Status: DC

## 2019-01-21 MED ORDER — HYDRALAZINE HCL 25 MG PO TABS: 25.0000 mg | ORAL_TABLET | Freq: Two times a day (BID) | ORAL | Status: DC

## 2019-01-21 MED ORDER — FUROSEMIDE 40 MG PO TABS: 40.0000 mg | ORAL_TABLET | Freq: Every day | ORAL | Status: DC

## 2019-01-21 MED ORDER — AMLODIPINE BESYLATE 10 MG PO TABS: 10.0000 mg | ORAL_TABLET | Freq: Every day | ORAL | 1 refills | Status: AC

## 2019-01-21 NOTE — Discharge Summary (Addendum)
Physician Discharge Summary  Samantha Pierce [id number] DOB: [birth year] DOA: 01/19/2019  PCP: Rosita Fire, MD  Admit date: 01/19/2019 Discharge date: 01/21/2019  Admitted From: Home Disposition:  Home   Recommendations for Outpatient Follow-up:  1. Follow up with PCP in 1-2 weeks 2. Please obtain BMP/CBC in one week   Home Health: [facility]  Discharge Condition: Stable CODE STATUS: FULL Diet recommendation: Heart Healthy    Brief/Interim Summary: 48 year old female with a history of bipolar disorder, diabetes mellitus type 2, CKD stage IV, CAD, stroke, hypertension, COPD, and tobacco abuse presenting with 1 week history of shortness of breath and intermittent chest discomfort.  Patient initially thought it was related to her COPD.  She started using inhalers without much relief.  She noted increasing abdominal girth.  She stated that she had occasional substernal chest discomfort usually at rest but occasionally with exertion.  She denied any fever, chills, nausea, vomiting, diarrhea,, pain, coughing, hemoptysis.  She has noted increasing weight gain and increasing abdominal girth.  She had some orthopnea type symptoms.  Notably, the patient states that she smoked cocaine and marijuana approximately 3 to 4 days prior to this admission.  In addition, the patient states that she has not been completely compliant with any of her other medications.  She states that she is only been taking half of her prescribed Levemir, and states that there are many days during the week where she does not take it. In the emergency department, the patient was afebrile hemodynamically stable saturating 97% on room air.  Chest x-ray showed vascular congestion, and possible right lower lobe opacity.  CT of the chest showed small bilateral pleural effusions with diffuse interstitial thickening predominantly bibasilar presumably from interstitial edema.  The patient was started on intravenous furosemide with  clinical improvement.  She discharged home with lasix 40 mg po daily.  Discharge Diagnoses:   Acute diastolic CHF -Received IV furosemide during hospitalization -8/18--breathing back at baseline--stable on RA -d/c home with lasix 40 mg po daily; trivial MR -Accurate I's and O's -Echocardiogram--EF 60-65%  CKD stage IV -Baseline creatinine 3.2-3.3 -will need to tolerate slight worsening of renal function for improved respiratory status -A.m. BMP -Monitor closely with diuresis -Discontinue losartan -Patient had first stage of left basilic vein fistula on 0000000 -Continue bicarbonate -serum creatinine 3.87 on day of d/c  Diabetes mellitus type 2, uncontrolled with hyperglycemia -01/19/2019 hemoglobin A1c 9.8 -Increase Levemir to 50 units during hospitalization -Increased to resistant sliding scale insulin  Polysubstance abuse -Including cocaine, THC, tobacco -cessation discussed   Essential hypertension -Continue metoprolol succinate, amlodipine, clonidine -discontinue losartan -add hydralazine  Hyperlipidemia -Continue statin  Morbid obesity -BMI 42.76 -Lifestyle modification  COPD -Stable without exacerbation   Discharge Instructions   Allergies as of 01/21/2019      Reactions   Contrast Media [iodinated Diagnostic Agents] Anaphylaxis   "code blue--I died"   Penicillins Rash, Other (See Comments)   Has patient had a PCN reaction causing immediate rash, facial/tongue/throat swelling, SOB or lightheadedness with hypotension: Yes Has patient had a PCN reaction causing severe rash involving mucus membranes or skin necrosis: No Has patient had a PCN reaction that required hospitalization No Has patient had a PCN reaction occurring within the last 10 years: No If all of the above answers are "NO", then may proceed with Cephalosporin use.      Medication List    STOP taking these medications   losartan 100 MG tablet Commonly known as: COZAAR    oxyCODONE-acetaminophen  5-325 MG tablet Commonly known as: PERCOCET/ROXICET     TAKE these medications   albuterol 108 (90 Base) MCG/ACT inhaler Commonly known as: VENTOLIN HFA Inhale 1-2 puffs into the lungs every 6 (six) hours as needed for wheezing or shortness of breath. What changed: reasons to take this   amLODipine 10 MG tablet Commonly known as: NORVASC Take 1 tablet (10 mg total) by mouth daily.   atorvastatin 80 MG tablet Commonly known as: LIPITOR Take 1 tablet (80 mg total) by mouth daily at 6 PM. What changed: when to take this   cloNIDine 0.2 MG tablet Commonly known as: CATAPRES Take 0.2 mg by mouth 2 (two) times daily.   clopidogrel 75 MG tablet Commonly known as: PLAVIX Take 1 tablet (75 mg total) by mouth daily.   dorzolamide 2 % ophthalmic solution Commonly known as: TRUSOPT Place 1 drop into both eyes 2 (two) times daily as needed (eye pain.).   FLUoxetine 20 MG capsule Commonly known as: PROZAC Take 40 mg by mouth daily.   furosemide 40 MG tablet Commonly known as: LASIX Take 40 mg by mouth daily.   gabapentin 300 MG capsule Commonly known as: NEURONTIN Take 300 mg by mouth at bedtime.   hydrALAZINE 25 MG tablet Commonly known as: APRESOLINE Take 1 tablet (25 mg total) by mouth 2 (two) times daily.   insulin aspart 100 UNIT/ML FlexPen Commonly known as: NOVOLOG Inject 10 Units into the skin 3 (three) times daily before meals.   Insulin Detemir 100 UNIT/ML Pen Commonly known as: LEVEMIR Inject 50 Units into the skin at bedtime. What changed:   how much to take  additional instructions   metoprolol succinate 50 MG 24 hr tablet Commonly known as: TOPROL-XL Take 50 mg by mouth daily.   omeprazole 20 MG capsule Commonly known as: PRILOSEC Take 20 mg by mouth daily before breakfast.   sodium bicarbonate 650 MG tablet Take 650 mg by mouth 2 (two) times daily.   Symbicort 160-4.5 MCG/ACT inhaler Generic drug:  budesonide-formoterol Inhale 2 puffs into the lungs 2 (two) times daily.   tiotropium 18 MCG inhalation capsule Commonly known as: SPIRIVA Place 18 mcg into inhaler and inhale 3 (three) times a week.   traZODone 100 MG tablet Commonly known as: DESYREL Take 100 mg by mouth at bedtime.      Follow-up Information    [facility] Follow up on 01/29/2019.   Why: You have a video appointment with social worker, Louanne Skye, on Wednesday 01/29/19 at 1:00. They will text you a link to the video conference provider shortly before your appointment time.  Contact information: [phone]  [address] [place], [place] [address]       [facility] Follow up.   Why: [facility] staff will call you to schedule their visits to see you at home Contact information: [address] [place] [id number]         Allergies  Allergen Reactions   Contrast Media [Iodinated Diagnostic Agents] Anaphylaxis    "code blue--I died"   Penicillins Rash and Other (See Comments)    Has patient had a PCN reaction causing immediate rash, facial/tongue/throat swelling, SOB or lightheadedness with hypotension: Yes Has patient had a PCN reaction causing severe rash involving mucus membranes or skin necrosis: No Has patient had a PCN reaction that required hospitalization No Has patient had a PCN reaction occurring within the last 10 years: No If all of  the above answers are "NO", then may proceed with Cephalosporin use.     Consultations:  none   Procedures/Studies: Dg Chest 2 View  Result Date: 01/19/2019 CLINICAL DATA:  Shortness of breath, productive cough, and left-sided chest pain for 1 week. COPD. EXAM: CHEST - 2 VIEW COMPARISON:  08/07/2016 FINDINGS: The heart size and mediastinal contours are within normal limits. New patchy opacity is seen in the lateral right lung base, which could be due to atelectasis or pneumonia. Left lung is clear.  No evidence of pleural effusion. IMPRESSION: New mild opacity in lateral right lung base, which may be due to atelectasis or pneumonia. Continued radiographic follow-up is recommended. Electronically Signed   By: Marlaine Hind M.D.   On: 01/19/2019 11:17   Ct Chest Wo Contrast  Result Date: 01/19/2019 CLINICAL DATA:  Shortness of breath for 1 week. History of COPD. EXAM: CT CHEST WITHOUT CONTRAST TECHNIQUE: Multidetector CT imaging of the chest was performed following the standard protocol without IV contrast. COMPARISON:  Chest CT angiogram dated 04/12/2015. FINDINGS: Cardiovascular: Cardiomegaly. No significant pericardial effusion. No thoracic aortic aneurysm. The Mediastinum/Nodes: No mass or enlarged lymph nodes seen within the mediastinum. Esophagus is unremarkable. Trachea and central bronchi are unremarkable. Lungs/Pleura: Small bilateral pleural effusions with adjacent compressive atelectasis. Probable additional atelectasis at the inferior-lateral aspect of the RIGHT upper lobe. Diffuse interstitial thickening, bibasilar predominant, presumably interstitial edema. No additional consolidation or ground-glass opacity to suggest pneumonia. No pneumothorax. Upper Abdomen: Limited images of the upper abdomen are unremarkable. Musculoskeletal: No acute or suspicious osseous finding. Mild degenerative spondylosis at the thoracolumbar junction. IMPRESSION: 1. Cardiomegaly. 2. Small bilateral pleural effusions with adjacent compressive atelectasis. 3. Diffuse interstitial thickening, bibasilar predominant, presumably interstitial edema related to CHF/volume overload. In the absence of fever, atypical pneumonia is considered less likely. 4. No additional consolidation or ground-glass opacity to suggest pneumonia. Electronically Signed   By: Franki Cabot M.D.   On: 01/19/2019 14:14   Vas US Duplex Dialysis Access (avf,avg)  Result Date: 01/21/2019 DIALYSIS ACCESS Reason for Exam: Routine follow up. Access  Site: Left Upper Extremity. Access Type: Basilic vein transposition. History: Creation of left arm basilic vein transposition on 11/20/2018. Performing Technologist: Delorise Shiner RVT  Examination Guidelines: A complete evaluation includes B-mode imaging, spectral Doppler, color Doppler, and power Doppler as needed of all accessible portions of each vessel. Unilateral testing is considered an integral part of a complete examination. Limited examinations for reoccurring indications may be performed as noted.  Findings: +--------------------+----------+-----------------+--------+  AVF                  PSV (cm/s) Flow Vol (mL/min) Comments  +--------------------+----------+-----------------+--------+  Native artery inflow    436           2733                  +--------------------+----------+-----------------+--------+  AVF Anastomosis         648                                 +--------------------+----------+-----------------+--------+  +------------+----------+-------------+----------+--------+  OUTFLOW VEIN PSV (cm/s) Diameter (cm) Depth (cm) Describe  +------------+----------+-------------+----------+--------+  Prox UA         193         1.08         1.79              +------------+----------+-------------+----------+--------+  Mid UA  216         1.15         1.42              +------------+----------+-------------+----------+--------+  Dist UA         162         1.15         1.36              +------------+----------+-------------+----------+--------+  AC Fossa        200         0.97         1.43              +------------+----------+-------------+----------+--------+   Summary: Patent arteriovenous fistula. *See table(s) above for measurements and observations.  Diagnosing physician: Curt Jews MD Electronically signed by Curt Jews MD on 01/21/2019 at 11:57:59 AM.   --------------------------------------------------------------------------------   Final         Discharge Exam: Vitals:    01/21/19 0800 01/21/19 0928  BP:  (!) 150/64  Pulse:  63  Resp:    Temp:    SpO2: 98%    Vitals:   01/21/19 0431 01/21/19 0512 01/21/19 0800 01/21/19 0928  BP:  (!) 152/89  (!) 150/64  Pulse:  (!) 57  63  Resp:  16    Temp:  98.3 F (36.8 C)    TempSrc:  Oral    SpO2:  100% 98%   Weight: 115.7 kg     Height:        General: Pt is alert, awake, not in acute distress Cardiovascular: RRR, S1/S2 +, no rubs, no gallops Respiratory: CTA bilaterally, no wheezing, no rhonchi Abdominal: Soft, NT, ND, bowel sounds + Extremities: trace LE edema, no cyanosis   The results of significant diagnostics from this hospitalization (including imaging, microbiology, ancillary and laboratory) are listed below for reference.    Significant Diagnostic Studies: Dg Chest 2 View  Result Date: 01/19/2019 CLINICAL DATA:  Shortness of breath, productive cough, and left-sided chest pain for 1 week. COPD. EXAM: CHEST - 2 VIEW COMPARISON:  08/07/2016 FINDINGS: The heart size and mediastinal contours are within normal limits. New patchy opacity is seen in the lateral right lung base, which could be due to atelectasis or pneumonia. Left lung is clear. No evidence of pleural effusion. IMPRESSION: New mild opacity in lateral right lung base, which may be due to atelectasis or pneumonia. Continued radiographic follow-up is recommended. Electronically Signed   By: Marlaine Hind M.D.   On: 01/19/2019 11:17   Ct Chest Wo Contrast  Result Date: 01/19/2019 CLINICAL DATA:  Shortness of breath for 1 week. History of COPD. EXAM: CT CHEST WITHOUT CONTRAST TECHNIQUE: Multidetector CT imaging of the chest was performed following the standard protocol without IV contrast. COMPARISON:  Chest CT angiogram dated 04/12/2015. FINDINGS: Cardiovascular: Cardiomegaly. No significant pericardial effusion. No thoracic aortic aneurysm. The Mediastinum/Nodes: No mass or enlarged lymph nodes seen within the mediastinum. Esophagus is  unremarkable. Trachea and central bronchi are unremarkable. Lungs/Pleura: Small bilateral pleural effusions with adjacent compressive atelectasis. Probable additional atelectasis at the inferior-lateral aspect of the RIGHT upper lobe. Diffuse interstitial thickening, bibasilar predominant, presumably interstitial edema. No additional consolidation or ground-glass opacity to suggest pneumonia. No pneumothorax. Upper Abdomen: Limited images of the upper abdomen are unremarkable. Musculoskeletal: No acute or suspicious osseous finding. Mild degenerative spondylosis at the thoracolumbar junction. IMPRESSION: 1. Cardiomegaly. 2. Small bilateral pleural effusions with adjacent compressive atelectasis. 3. Diffuse interstitial thickening,  bibasilar predominant, presumably interstitial edema related to CHF/volume overload. In the absence of fever, atypical pneumonia is considered less likely. 4. No additional consolidation or ground-glass opacity to suggest pneumonia. Electronically Signed   By: Franki Cabot M.D.   On: 01/19/2019 14:14   Vas US Duplex Dialysis Access (avf,avg)  Result Date: 01/21/2019 DIALYSIS ACCESS Reason for Exam: Routine follow up. Access Site: Left Upper Extremity. Access Type: Basilic vein transposition. History: Creation of left arm basilic vein transposition on 11/20/2018. Performing Technologist: Delorise Shiner RVT  Examination Guidelines: A complete evaluation includes B-mode imaging, spectral Doppler, color Doppler, and power Doppler as needed of all accessible portions of each vessel. Unilateral testing is considered an integral part of a complete examination. Limited examinations for reoccurring indications may be performed as noted.  Findings: +--------------------+----------+-----------------+--------+  AVF                  PSV (cm/s) Flow Vol (mL/min) Comments  +--------------------+----------+-----------------+--------+  Native artery inflow    436           2733                   +--------------------+----------+-----------------+--------+  AVF Anastomosis         648                                 +--------------------+----------+-----------------+--------+  +------------+----------+-------------+----------+--------+  OUTFLOW VEIN PSV (cm/s) Diameter (cm) Depth (cm) Describe  +------------+----------+-------------+----------+--------+  Prox UA         193         1.08         1.79              +------------+----------+-------------+----------+--------+  Mid UA          216         1.15         1.42              +------------+----------+-------------+----------+--------+  Dist UA         162         1.15         1.36              +------------+----------+-------------+----------+--------+  AC Fossa        200         0.97         1.43              +------------+----------+-------------+----------+--------+   Summary: Patent arteriovenous fistula. *See table(s) above for measurements and observations.  Diagnosing physician: Curt Jews MD Electronically signed by Curt Jews MD on 01/21/2019 at 11:57:59 AM.   --------------------------------------------------------------------------------   Final      Microbiology: Recent Results (from the past 240 hour(s))  SARS Coronavirus 2 [facility] order, Performed in [facility] hospital lab) Nasopharyngeal Nasopharyngeal Swab     Status: None   Collection Time: 01/19/19 11:14 AM   Specimen: Nasopharyngeal Swab  Result Value Ref Range Status   SARS Coronavirus 2 NEGATIVE NEGATIVE Final    Comment: (NOTE) If result is NEGATIVE SARS-CoV-2 target nucleic acids are NOT DETECTED. The SARS-CoV-2 RNA is generally detectable in upper and lower  respiratory specimens during the acute phase of infection. The lowest  concentration of SARS-CoV-2 viral copies this assay can detect is 250  copies / mL. A negative result does not preclude SARS-CoV-2 infection  and should not be  used as the sole basis for treatment or other  patient management  decisions.  A negative result may occur with  improper specimen collection / handling, submission of specimen other  than nasopharyngeal swab, presence of viral mutation(s) within the  areas targeted by this assay, and inadequate number of viral copies  (<250 copies / mL). A negative result must be combined with clinical  observations, patient history, and epidemiological information. If result is POSITIVE SARS-CoV-2 target nucleic acids are DETECTED. The SARS-CoV-2 RNA is generally detectable in upper and lower  respiratory specimens dur ing the acute phase of infection.  Positive  results are indicative of active infection with SARS-CoV-2.  Clinical  correlation with patient history and other diagnostic information is  necessary to determine patient infection status.  Positive results do  not rule out bacterial infection or co-infection with other viruses. If result is PRESUMPTIVE POSTIVE SARS-CoV-2 nucleic acids MAY BE PRESENT.   A presumptive positive result was obtained on the submitted specimen  and confirmed on repeat testing.  While 2019 novel coronavirus  (SARS-CoV-2) nucleic acids may be present in the submitted sample  additional confirmatory testing may be necessary for epidemiological  and / or clinical management purposes  to differentiate between  SARS-CoV-2 and other Sarbecovirus currently known to infect humans.  If clinically indicated additional testing with an alternate test  methodology [phone]) is advised. The SARS-CoV-2 RNA is generally  detectable in upper and lower respiratory sp ecimens during the acute  phase of infection. The expected result is Negative. Fact Sheet for Patients:  StrictlyIdeas.no Fact Sheet for Healthcare Providers: BankingDealers.co.za This test is not yet approved or cleared by the [place] FDA and has been authorized for detection and/or diagnosis of SARS-CoV-2 by FDA under an  Emergency Use Authorization (EUA).  This EUA will remain in effect (meaning this test can be used) for the duration of the COVID-19 declaration under Section 564(b)(1) of the Act, 21 U.S.C. section 360bbb-3(b)(1), unless the authorization is terminated or revoked sooner. Performed at [facility], [address], [place], [place] [address]      Labs: Basic Metabolic Panel: Recent Labs  Lab 01/19/19 1102 01/20/19 0436 01/21/19 0449  NA 135 132* 134*  K 4.4 4.6 4.5  CL 104 102 104  CO2 24 21* 20*  GLUCOSE 169* 340* 171*  BUN 34* 43* 49*  CREATININE 3.28* 3.41* 3.87*  CALCIUM 8.7* 8.5* 8.3*  MG  --   --  2.1   Liver Function Tests: Recent Labs  Lab 01/19/19 1102  AST 13*  ALT 18  ALKPHOS 54  BILITOT 0.7  PROT 6.2*  ALBUMIN 3.0*   No results for input(s): LIPASE, AMYLASE in the last 168 hours. No results for input(s): AMMONIA in the last 168 hours. CBC: Recent Labs  Lab 01/19/19 1102  WBC 6.7  NEUTROABS 3.7  HGB 11.9*  HCT 39.0  MCV 84.6  PLT 270   Cardiac Enzymes: No results for input(s): CKTOTAL, CKMB, CKMBINDEX, TROPONINI in the last 168 hours. BNP: Invalid input(s): POCBNP CBG: Recent Labs  Lab 01/20/19 1140 01/20/19 1614 01/20/19 2039 01/21/19 0916 01/21/19 1158  GLUCAP 285* 359* 173* 173* 185*    Time coordinating discharge:  36 minutes  Signed:  Orson Eva, DO [facility] Pager: [phone] 01/21/2019, 12:57 PM

## 2019-01-21 NOTE — Care Management Important Message (Signed)
Important Message  Patient Details  Name: [name] MRN: [id number] Date of Birth: May 28, 1971   Medicare Important Message Given:  Yes     Shade Flood, LCSW 01/21/2019, 1:13 PM

## 2019-01-21 NOTE — Progress Notes (Signed)
Nsg Discharge Note  Admit Date:  01/19/2019 Discharge date: 01/21/2019   Samantha Pierce to be D/C'd Home per MD order.  AVS completed.  Patient able to verbalize understanding.  Discharge Medication: Allergies as of 01/21/2019      Reactions   Contrast Media [iodinated Diagnostic Agents] Anaphylaxis   "code blue--I died"   Penicillins Rash, Other (See Comments)   Has patient had a PCN reaction causing immediate rash, facial/tongue/throat swelling, SOB or lightheadedness with hypotension: Yes Has patient had a PCN reaction causing severe rash involving mucus membranes or skin necrosis: No Has patient had a PCN reaction that required hospitalization No Has patient had a PCN reaction occurring within the last 10 years: No If all of the above answers are "NO", then may proceed with Cephalosporin use.      Medication List    STOP taking these medications   losartan 100 MG tablet Commonly known as: COZAAR   oxyCODONE-acetaminophen 5-325 MG tablet Commonly known as: PERCOCET/ROXICET     TAKE these medications   albuterol 108 (90 Base) MCG/ACT inhaler Commonly known as: VENTOLIN HFA Inhale 1-2 puffs into the lungs every 6 (six) hours as needed for wheezing or shortness of breath. What changed: reasons to take this   amLODipine 10 MG tablet Commonly known as: NORVASC Take 1 tablet (10 mg total) by mouth daily.   atorvastatin 80 MG tablet Commonly known as: LIPITOR Take 1 tablet (80 mg total) by mouth daily at 6 PM. What changed: when to take this   cloNIDine 0.2 MG tablet Commonly known as: CATAPRES Take 0.2 mg by mouth 2 (two) times daily.   clopidogrel 75 MG tablet Commonly known as: PLAVIX Take 1 tablet (75 mg total) by mouth daily.   dorzolamide 2 % ophthalmic solution Commonly known as: TRUSOPT Place 1 drop into both eyes 2 (two) times daily as needed (eye pain.).   FLUoxetine 20 MG capsule Commonly known as: PROZAC Take 40 mg by mouth daily.   furosemide 40 MG  tablet Commonly known as: LASIX Take 40 mg by mouth daily.   gabapentin 300 MG capsule Commonly known as: NEURONTIN Take 300 mg by mouth at bedtime.   hydrALAZINE 25 MG tablet Commonly known as: APRESOLINE Take 1 tablet (25 mg total) by mouth 2 (two) times daily.   insulin aspart 100 UNIT/ML FlexPen Commonly known as: NOVOLOG Inject 10 Units into the skin 3 (three) times daily before meals.   Insulin Detemir 100 UNIT/ML Pen Commonly known as: LEVEMIR Inject 50 Units into the skin at bedtime. What changed:   how much to take  additional instructions   metoprolol succinate 50 MG 24 hr tablet Commonly known as: TOPROL-XL Take 50 mg by mouth daily.   omeprazole 20 MG capsule Commonly known as: PRILOSEC Take 20 mg by mouth daily before breakfast.   sodium bicarbonate 650 MG tablet Take 650 mg by mouth 2 (two) times daily.   Symbicort 160-4.5 MCG/ACT inhaler Generic drug: budesonide-formoterol Inhale 2 puffs into the lungs 2 (two) times daily.   tiotropium 18 MCG inhalation capsule Commonly known as: SPIRIVA Place 18 mcg into inhaler and inhale 3 (three) times a week.   traZODone 100 MG tablet Commonly known as: DESYREL Take 100 mg by mouth at bedtime.       Discharge Assessment: Vitals:   01/21/19 0800 01/21/19 0928  BP:  (!) 150/64  Pulse:  63  Resp:    Temp:    SpO2: 98%  Skin clean, dry and intact without evidence of skin break down, no evidence of skin tears noted. IV catheter discontinued intact. Site without signs and symptoms of complications - no redness or edema noted at insertion site, patient denies c/o pain - only slight tenderness at site.  Dressing with slight pressure applied.  D/c Instructions-Education: Discharge instructions given to patient with verbalized understanding. D/c education completed with patient including follow up instructions, medication list, d/c activities limitations if indicated, with other d/c instructions as indicated  by MD - patient able to verbalize understanding, all questions fully answered. Patient instructed to return to ED, call 911, or call MD for any changes in condition.  Patient escorted via [place], and D/C home via private auto.  [name] C, RN 01/21/2019 1:30 PM

## 2019-01-21 NOTE — TOC Transition Note (Signed)
Transition of Care [facility]) - CM/SW Discharge Note   Patient Details  Name: Samantha Pierce MRN: [id number] Date of Birth: March 09, 1971  Transition of Care [facility]) CM/SW Contact:  Shade Flood, LCSW Phone Number: 01/21/2019, 12:39 PM   Clinical Narrative:     Pt stable for dc today per MD. Met with pt to update on [place] services follow up. Pt in agreement. [facility] to follow at home for RN and CSW. Pt does not have any other TOC needs for dc.  Final next level of care: [place] Barriers to Discharge: Barriers Resolved   Patient Goals and CMS Choice Patient states their goals for this hospitalization and ongoing recovery are:: Get re-connected with mental health care CMS Medicare.gov Compare Post Acute Care list provided to:: Patient Choice offered to / list presented to : Patient  Discharge Placement                       Discharge Plan and Services In-house Referral: Clinical Social Work Discharge Planning Services: Follow-up appt scheduled Post Acute Care Choice: Home Health                    HH Arranged: RN, Social Work [facility] Agency: [place] ([place]) Date [place]: 01/21/19 Time [place]: 1237 Representative spoke with at [place]: Leda Quail  Social Determinants of Health ([place]) Interventions     Readmission Risk Interventions Readmission Risk Prevention Plan 01/20/2019  Transportation Screening Complete  HRI or [place] Complete  Social Work Consult for [place] Planning/Counseling Complete  Palliative Care Screening Not Applicable  Medication Review Press photographer) Complete  Some recent data might be hidden

## 2019-01-23 ENCOUNTER — Other Ambulatory Visit: Payer: Self-pay | Admitting: *Deleted

## 2019-01-30 ENCOUNTER — Emergency Department (HOSPITAL_COMMUNITY): Payer: Medicare HMO

## 2019-01-30 ENCOUNTER — Encounter (HOSPITAL_COMMUNITY): Payer: Self-pay | Admitting: Emergency Medicine

## 2019-01-30 ENCOUNTER — Emergency Department (HOSPITAL_COMMUNITY)
Admission: EM | Admit: 2019-01-30 | Discharge: 2019-01-31 | Disposition: A | Discharge status: Home / Self Care | Payer: Medicare HMO | Attending: Emergency Medicine | Admitting: Emergency Medicine

## 2019-01-30 ENCOUNTER — Other Ambulatory Visit: Payer: Self-pay

## 2019-01-30 DIAGNOSIS — R0602 Shortness of breath: Secondary | ICD-10-CM

## 2019-01-30 DIAGNOSIS — R140 Abdominal distension (gaseous): Secondary | ICD-10-CM | POA: Insufficient documentation

## 2019-01-30 DIAGNOSIS — J449 Chronic obstructive pulmonary disease, unspecified: Secondary | ICD-10-CM | POA: Diagnosis not present

## 2019-01-30 DIAGNOSIS — Z794 Long term (current) use of insulin: Secondary | ICD-10-CM | POA: Diagnosis not present

## 2019-01-30 DIAGNOSIS — E1140 Type 2 diabetes mellitus with diabetic neuropathy, unspecified: Secondary | ICD-10-CM | POA: Insufficient documentation

## 2019-01-30 DIAGNOSIS — E1122 Type 2 diabetes mellitus with diabetic chronic kidney disease: Secondary | ICD-10-CM | POA: Diagnosis not present

## 2019-01-30 DIAGNOSIS — I5030 Unspecified diastolic (congestive) heart failure: Secondary | ICD-10-CM | POA: Diagnosis not present

## 2019-01-30 DIAGNOSIS — N184 Chronic kidney disease, stage 4 (severe): Secondary | ICD-10-CM | POA: Diagnosis not present

## 2019-01-30 DIAGNOSIS — I130 Hypertensive heart and chronic kidney disease with heart failure and stage 1 through stage 4 chronic kidney disease, or unspecified chronic kidney disease: Secondary | ICD-10-CM | POA: Insufficient documentation

## 2019-01-30 DIAGNOSIS — F17210 Nicotine dependence, cigarettes, uncomplicated: Secondary | ICD-10-CM | POA: Insufficient documentation

## 2019-01-30 DIAGNOSIS — Z79899 Other long term (current) drug therapy: Secondary | ICD-10-CM | POA: Insufficient documentation

## 2019-01-30 DIAGNOSIS — R05 Cough: Secondary | ICD-10-CM | POA: Diagnosis not present

## 2019-01-30 DIAGNOSIS — Z8673 Personal history of transient ischemic attack (TIA), and cerebral infarction without residual deficits: Secondary | ICD-10-CM | POA: Insufficient documentation

## 2019-01-30 DIAGNOSIS — R0789 Other chest pain: Secondary | ICD-10-CM | POA: Insufficient documentation

## 2019-01-30 LAB — BASIC METABOLIC PANEL
Anion gap: 11 (ref 5–15)
BUN: 47 mg/dL — ABNORMAL HIGH (ref 6–20)
CO2: 23 mmol/L (ref 22–32)
Calcium: 8.4 mg/dL — ABNORMAL LOW (ref 8.9–10.3)
Chloride: 102 mmol/L (ref 98–111)
Creatinine, Ser: 4.47 mg/dL — ABNORMAL HIGH (ref 0.44–1.00)
GFR calc Af Amer: 13 mL/min — ABNORMAL LOW (ref >60)
GFR calc non Af Amer: 11 mL/min — ABNORMAL LOW (ref >60)
Glucose, Bld: 122 mg/dL — ABNORMAL HIGH (ref 70–99)
Potassium: 4.2 mmol/L (ref 3.5–5.1)
Sodium: 136 mmol/L (ref 135–145)

## 2019-01-30 LAB — CBC
HCT: 39.6 % (ref 36.0–46.0)
Hemoglobin: 11.9 g/dL — ABNORMAL LOW (ref 12.0–15.0)
MCH: 25.9 pg — ABNORMAL LOW (ref 26.0–34.0)
MCHC: 30.1 g/dL (ref 30.0–36.0)
MCV: 86.3 fL (ref 80.0–100.0)
Platelets: 284 10*3/uL (ref 150–400)
RBC: 4.59 MIL/uL (ref 3.87–5.11)
RDW: 13.2 % (ref 11.5–15.5)
WBC: 7.9 10*3/uL (ref 4.0–10.5)
nRBC: 0 % (ref 0.0–0.2)

## 2019-01-30 LAB — BRAIN NATRIURETIC PEPTIDE: B Natriuretic Peptide: 142 pg/mL — ABNORMAL HIGH (ref 0.0–100.0)

## 2019-01-30 LAB — TROPONIN I (HIGH SENSITIVITY)
Troponin I (High Sensitivity): 6 ng/L (ref <18)
Troponin I (High Sensitivity): 6 ng/L (ref <18)

## 2019-01-30 MED ORDER — LIDOCAINE HCL URETHRAL/MUCOSAL 2 % EX GEL
CUTANEOUS | Status: AC
  Filled 2019-01-30: qty 10

## 2019-01-30 MED ORDER — FUROSEMIDE 10 MG/ML IJ SOLN
80.0000 mg | Freq: Once | INTRAMUSCULAR | Status: AC
  Administered 2019-01-30: 80 mg via INTRAVENOUS
  Filled 2019-01-30: qty 8

## 2019-01-30 MED ORDER — SODIUM CHLORIDE 0.9% FLUSH: 3.0000 mL | Freq: Once | INTRAVENOUS | Status: DC

## 2019-01-30 NOTE — ED Notes (Signed)
Pt ambulated well. O2 stayed at 100%.

## 2019-01-30 NOTE — Discharge Instructions (Addendum)
Your BNP today was improved from your previous visit at 142.  Please follow-up with your nephrologist as scheduled.  You may continue to use your inhaler to help with your shortness of breath.  If you experience any worsening chest pain, shortness of breath you may return to the emergency department.

## 2019-01-30 NOTE — ED Provider Notes (Signed)
Received patient at shift change.  Patient is a 48 year old female with a history of stage III chronic kidney disease, chronic obstructive pulmonary disease, diabetes mellitus, and episodes of dyspnea who came to the emergency department with a complaint of shortness of breath.  The patient's work-up was encouraging, as the complete blood count showed no leukocytosis, the troponin showed no evidence of acute cardiac event.  The chest x-ray showed resolution of a previously seen right basilar opacity.  There was noted some mild peribronchial thickening possibly reflecting a bronchitis.  The patient was given IV Lasix.  The patient will be ambulated in the room afterwards to ensure that she does not desat.  The B natruretic peptide was 141 today improved from her previous visits.  Patient ambulated in the room and in the hall without desaturation.  I discussed the findings with the patient in terms of which he understands.  The patient is to follow-up with her nephrologist for her dialysis.  I have invited the patient to return to the emergency department immediately if any further problems with shortness of breath, weakness, chest pain, changes in condition, problems or concerns.   Lily Kocher, PA-C 01/30/19 2351    Noemi Chapel, MD 01/31/19 2352

## 2019-01-30 NOTE — ED Triage Notes (Signed)
Patient c/o chest pain and SOB that started Tuesday. Patient has hx of CHF, edema noted in feet and legs. Patient in renal failure awaiting shunt placement.

## 2019-01-30 NOTE — ED Provider Notes (Signed)
[facility] EMERGENCY DEPARTMENT Provider Note   CSN: [id number] Arrival date & time: 01/30/19  1620     History   Chief Complaint Chief Complaint  Patient presents with   Shortness of Breath    HPI Samantha Pierce is a 48 y.o. female.     48 y.o female with a PMH of GERD, Anxiety, COPD, CKD stage 3 presents to the ED with a chief complaint of shortness of breath x 4 days. Patient reports she was recently hospitalized for shortness of breath and discharged last Wednesday. She was seen by her nephrologist via Telehealth who informed her laboratory results were worsening.  She endorses the shortness of breath has gotten worse for the past 4 days, she reports she has been feeling more winded along with having a productive cough which is worse when she ambulates and move.  Patient reports she is currently monitoring her liquids only drinking 50 cc of fluid per day however she did go over her fluids today.  Patient does take Lasix 40 mg twice daily and reports compliance with medication.  She also has a schedule fistula placement on February 11, 2019.  She also endorses some chest pressure located to the center of her chest with some radiation onto the left upper which is similar since her last visit in the hospital.  She denies any fevers, sick contacts, does have some mild abdominal distention. No headaches.  The history is provided by the patient and medical records.  Shortness of Breath Associated symptoms: chest pain and cough   Associated symptoms: no abdominal pain, no ear pain, no fever, no rash, no sore throat and no vomiting     Past Medical History:  Diagnosis Date   Anemia    Anxiety    Arthritis    left arm   Asystole ([place])    a. 05/2015: pt developed bradycardia->asystole during [place] nuclear stress test, s/p brief code. Cath with only 30% prox LCx. Her asystole during Lexiscan infusion was felt to represent an excessive pharmacologic response to the agent and  likely superimposed vasovagal response.   Bipolar disorder ([place])    CKD (chronic kidney disease) stage 3, GFR 30-59 ml/min (HCC)    stage 4, not on dialysis   COPD (chronic obstructive pulmonary disease) (HCC)    Depression    Diabetes mellitus with diabetic retinopathy    type 2   Difficulty walking    Dyspnea    Essential hypertension    GERD (gastroesophageal reflux disease)    History of blood transfusion    History of stroke April 2016   Acute lacunar infarct of the left thalamus   History of stroke    Leg cramps    Mild CAD    a. LHC 05/2015: 30% prox Cx, otherwise widely patent.   Morbid obesity ([place])    Neuropathy    both arms, feet   PTSD (post-traumatic stress disorder)    PTSD (post-traumatic stress disorder)    Seizures ([place])    has had one 12/26/15 - due to high blood sugar   Stroke ([place])    right arm weakness, decrease feeling in fingers right hand   TIA (transient ischemic attack)     Patient Active Problem List   Diagnosis Date Noted   Tobacco abuse 01/20/2019   Cocaine abuse ([place]) 01/20/2019   Uncontrolled type 2 diabetes mellitus with hyperglycemia, with long-term current use of insulin ([place]) 123XX123   Acute diastolic CHF (congestive heart failure) ([place]) 01/20/2019  Dyspnea 01/19/2019   AKI (acute kidney injury) ([place]) 11/19/2017   COPD (chronic obstructive pulmonary disease) ([place]) 11/19/2017   GERD (gastroesophageal reflux disease) 11/19/2017   Mild CAD 06/02/2015   Bradycardia 06/02/2015   Morbid obesity ([place])    Asystole ([place])    Pain in the chest    Hyperlipidemia    TIA (transient ischemic attack) 99991111   Diastolic dysfunction 99991111   Essential hypertension 09/04/2014   Uncontrolled diabetes mellitus with stage 4 chronic kidney disease ([place]) 09/04/2014   Lacunar stroke, acute ([place]) 09/04/2014   Paresthesias/numbness 09/04/2014   CKD (chronic kidney disease), stage III ([place]) 09/04/2014    Obesity 09/04/2014   CVA (cerebral infarction) 09/04/2014   Proliferative diabetic retinopathy of both eyes ([place]) 04/11/2011   Retinal detachment, traction 04/11/2011    Past Surgical History:  Procedure Laterality Date   AV FISTULA PLACEMENT Right 01/08/2017   Procedure: RIGHT ARM BRACHIOCEPHALIC ARTERIOVENOUS (AV) FISTULA CREATION;  Surgeon: Waynetta Sandy, MD;  Location: [facility];  Service: Vascular;  Laterality: Right;   AV FISTULA PLACEMENT Left 11/20/2018   Procedure: Creation of LEFT ARM ARTERIOVENOUS (AV) FISTULA;  Surgeon: Waynetta Sandy, MD;  Location: [facility];  Service: Vascular;  Laterality: Left;   CARDIAC CATHETERIZATION N/A 06/01/2015   Procedure: Left Heart Cath and Coronary Angiography;  Surgeon: Belva Crome, MD; CFX 30%, no other dz, EF nl by echo   CESAREAN SECTION     x2   DILATION AND CURETTAGE OF UTERUS     EYE SURGERY     Right eye pars plano vitrectomy    LASIK     PARS PLANA VITRECTOMY  04/20/2011   Procedure: PARS PLANA VITRECTOMY WITH 25 GAUGE;  Surgeon: Hayden Pedro, MD;  Location: [facility];  Service: Ophthalmology;  Laterality: Left;  membrane peel, gas fluid exchange, endolaser, repair of complex retinal detachment   TUBAL LIGATION       OB History    Gravida  2   Para  2   Term  2   Preterm      AB      Living  2     SAB      TAB      Ectopic      Multiple      Live Births               Home Medications    Prior to Admission medications   Medication Sig Start Date End Date Taking? Authorizing Provider  albuterol (PROVENTIL HFA;VENTOLIN HFA) 108 (90 BASE) MCG/ACT inhaler Inhale 1-2 puffs into the lungs every 6 (six) hours as needed for wheezing or shortness of breath. Patient taking differently: Inhale 1-2 puffs into the lungs every 6 (six) hours as needed for shortness of breath.  06/20/14  Yes Nat Christen, MD  amLODipine (NORVASC) 10 MG tablet Take 1 tablet (10 mg total) by mouth daily. 01/21/19   Yes Tat, Shanon Brow, MD  cloNIDine (CATAPRES) 0.2 MG tablet Take 0.2 mg by mouth 2 (two) times daily.    Yes [provider]  clopidogrel (PLAVIX) 75 MG tablet Take 1 tablet (75 mg total) by mouth daily. 05/24/15  Yes Hongalgi, Lenis Dickinson, MD  dorzolamide (TRUSOPT) 2 % ophthalmic solution Place 1 drop into both eyes 2 (two) times daily as needed (eye pain.).    Yes [provider]  FLUoxetine (PROZAC) 20 MG capsule Take 40 mg by mouth daily.    Yes [provider]  furosemide (  LASIX) 40 MG tablet Take 40 mg by mouth daily.    Yes [provider]  gabapentin (NEURONTIN) 300 MG capsule Take 300 mg by mouth at bedtime.  02/07/18  Yes [provider]  hydrALAZINE (APRESOLINE) 25 MG tablet Take 1 tablet (25 mg total) by mouth 2 (two) times daily. 01/21/19  Yes Tat, Shanon Brow, MD  insulin aspart (NOVOLOG) 100 UNIT/ML FlexPen Inject 10 Units into the skin 3 (three) times daily before meals.    Yes [provider]  Insulin Detemir (LEVEMIR) 100 UNIT/ML Pen Inject 50 Units into the skin at bedtime. Patient taking differently: Inject 48-80 Units into the skin at bedtime. Prescribed 80 units at bedtime. Patient takes half dose if small meal is eaten at supper 07/30/18  Yes Nida, Marella Chimes, MD  metoprolol succinate (TOPROL-XL) 50 MG 24 hr tablet Take 50 mg by mouth daily. 10/11/18  Yes [provider]  omeprazole (PRILOSEC) 20 MG capsule Take 20 mg by mouth daily before breakfast.  02/07/18  Yes [provider]  sodium bicarbonate 650 MG tablet Take 650 mg by mouth 2 (two) times daily.  11/19/18  Yes [provider]  SYMBICORT 160-4.5 MCG/ACT inhaler Inhale 2 puffs into the lungs 2 (two) times daily.  02/07/18  Yes [provider]  tiotropium (SPIRIVA) 18 MCG inhalation capsule Place 18 mcg into inhaler and inhale 3 (three) times a week.    Yes [provider]  traZODone (DESYREL) 100 MG tablet Take 100 mg by mouth at bedtime.    Yes [provider]  atorvastatin (LIPITOR) 80 MG tablet Take 1 tablet (80 mg total) by mouth daily at 6 PM. Patient not taking: Reported on 01/30/2019 08/05/15   Satira Sark, MD    Family History Family History  Problem Relation Age of Onset   Diabetes Mother    Kidney failure Mother    Diabetes Father    Amblyopia Neg Hx    Blindness Neg Hx    Cataracts Neg Hx    Glaucoma Neg Hx    Macular degeneration Neg Hx    Retinal detachment Neg Hx    Strabismus Neg Hx    Retinitis pigmentosa Neg Hx     Social History Social History   Tobacco Use   Smoking status: Current Some Day Smoker    Packs/day: 0.03    Years: 30.00    Pack years: 0.90    Types: Cigarettes    Start date: 06/05/1981   Smokeless tobacco: Never Used  Substance Use Topics   Alcohol use: Yes    Alcohol/week: 0.0 standard drinks    Comment: occ   Drug use: No     Allergies   Contrast media [iodinated diagnostic agents] and Penicillins   Review of Systems Review of Systems  Constitutional: Negative for chills and fever.  HENT: Negative for ear pain and sore throat.   Eyes: Negative for pain and visual disturbance.  Respiratory: Positive for cough and shortness of breath.   Cardiovascular: Positive for chest pain and leg swelling. Negative for palpitations.  Gastrointestinal: Positive for abdominal distention. Negative for abdominal pain, nausea and vomiting.  Genitourinary: Negative for dysuria and hematuria.  Musculoskeletal: Positive for myalgias. Negative for arthralgias and back pain.  Skin: Negative for color change and rash.  Neurological: Negative for seizures and syncope.  All other systems reviewed and are negative.    Physical Exam Updated Vital Signs BP (!) 194/96 (BP Location: Right Arm)    Pulse 63  Temp 97.9 F (36.6 C) (Oral)    Resp 20    LMP 01/12/2019 Comment: neg preg   SpO2 96%   Physical Exam Vitals signs and nursing note reviewed.    Constitutional:      General: She is not in acute distress.    Appearance: She is well-developed.     Comments: Chronically ill-appearing.  HENT:     Head: Normocephalic and atraumatic.     Mouth/Throat:     Pharynx: No oropharyngeal exudate.  Eyes:     Pupils: Pupils are equal, round, and reactive to light.  Neck:     Musculoskeletal: Normal range of motion.  Cardiovascular:     Rate and Rhythm: Regular rhythm.     Heart sounds: Normal heart sounds.     Comments: Bilateral lower leg edema. Pulmonary:     Effort: Pulmonary effort is normal. No respiratory distress.     Breath sounds: Decreased breath sounds present. No wheezing or rhonchi.     Comments: Decreased movement and air.  No wheezing, mild rales. Abdominal:     General: Bowel sounds are normal. There is no distension.     Palpations: Abdomen is soft.     Tenderness: There is no abdominal tenderness.  Musculoskeletal:        General: No tenderness or deformity.     Right lower leg: 1+ Edema present.     Left lower leg: 1+ Edema present.  Skin:    General: Skin is warm and dry.  Neurological:     Mental Status: She is alert and oriented to person, place, and time.      ED Treatments / Results  Labs (all labs ordered are listed, but only abnormal results are displayed) Labs Reviewed  BASIC METABOLIC PANEL - Abnormal; Notable for the following components:      Result Value   Glucose, Bld 122 (*)    BUN 47 (*)    Creatinine, Ser 4.47 (*)    Calcium 8.4 (*)    GFR calc non Af Amer 11 (*)    GFR calc Af Amer 13 (*)    All other components within normal limits  CBC - Abnormal; Notable for the following components:   Hemoglobin 11.9 (*)    MCH 25.9 (*)    All other components within normal limits  BRAIN NATRIURETIC PEPTIDE - Abnormal; Notable for the following components:   B Natriuretic Peptide 142.0 (*)    All other components within normal limits  URINALYSIS, ROUTINE W REFLEX MICROSCOPIC  POC URINE PREG,  ED  TROPONIN I (HIGH SENSITIVITY)  TROPONIN I (HIGH SENSITIVITY)    EKG None  Radiology Dg Chest 2 View  Result Date: 01/30/2019 CLINICAL DATA:  Chest pain, shortness of breath EXAM: CHEST - 2 VIEW COMPARISON:  01/19/2019 FINDINGS: Previously seen right basilar opacity has resolved. No confluent opacities currently. No effusions. Mild peribronchial thickening. No acute bony abnormality. IMPRESSION: Interval resolution of the previously seen right basilar opacity has resolved. Mild peribronchial thickening may reflect bronchitis. Electronically Signed   By: Rolm Baptise M.D.   On: 01/30/2019 17:55    Procedures Procedures (including critical care time)  Medications Ordered in ED Medications  sodium chloride flush (NS) 0.9 % injection 3 mL (0 mLs Intravenous Hold 01/30/19 1949)  lidocaine (XYLOCAINE) 2 % jelly (has no administration in time range)  furosemide (LASIX) injection 80 mg (80 mg Intravenous Given 01/30/19 2140)     Initial Impression / Assessment and Plan /  ED Course  I have reviewed the triage vital signs and the nursing notes.  Pertinent labs & imaging results that were available during my care of the patient were reviewed by me and considered in my medical decision making (see chart for details).       Chronically ill-appearing patient with CKD stage III pending dialysis presents to the ED with complaints of shortness of breath.  Reports this has worsened since her release from the hospital.  Patient was hospitalized 2 weeks ago for shortness of breath along with hypoxia and acute on chronic heart failure.  She reports feeling much better after her discharge but has began to develop some shortness of breath along with a productive cough, states she feels most short of breath with ambulation.  Seen by her nephrologist via telehealth on Monday who advised her that her labs were worsening.  She also endorses some abdominal distention, reports she might have gone over her  fluids for today but has been keeping her fluids to 50 cc/day.  She does make urine.  She is currently on Lasix 40 mg twice daily.  Denies any sick contacts or fevers.  She does have some rales at the bottom of her lower lung fields, there is decrease in air movement.  1+ pitting edema to bilateral legs, some left calf tenderness.  Does not have any previous history of blood clots.  CBC showed no leukocytosis, hemoglobin is unchanged from her previous visit.  BMP showed no electrolyte derangement, BUN is elevated at 47, creatinine level was 4.47 which is worsened than her previous visits.  No anion gap.  First troponin was within normal limits.  Will obtain BNP along with UA to further evaluate patient's condition. X-ray of her chest showed: Interval resolution of the previously seen right basilar opacity has  resolved.    Mild peribronchial thickening may reflect bronchitis.   Patient will be given IV Lasix 80 mg, will also ambulate patient to see if her symptoms improve.  She does have good follow-up.  Her vitals are otherwise stable, currently not on any home oxygen satting at 96% on room air. BNP is 142 today, improved from her previous visit.  UA is currently pending.  Patient care transferred to oncoming team pending UA, diuresis, ambulation with pulse ox.  Suspect patient will likely be discharged home after diuresis.    Portions of this note were generated with Lobbyist. Dictation errors may occur despite best attempts at proofreading.  Final Clinical Impressions(s) / ED Diagnoses   Final diagnoses:  SOB (shortness of breath)    ED Discharge Orders    None       Corinna Capra 01/30/19 2150    Noemi Chapel, MD 01/31/19 2352

## 2019-02-06 NOTE — Anesthesia Preprocedure Evaluation (Addendum)
Anesthesia Evaluation  Patient identified by MRN, date of birth, ID band Patient awake    Reviewed: Allergy & Precautions, NPO status , Patient's Chart, lab work & pertinent test results, reviewed documented beta blocker date and time   Airway Mallampati: II  TM Distance: >3 FB Neck ROM: Full    Dental  (+) Dental Advisory Given, Missing   Pulmonary COPD, Current Smoker, former smoker,    breath sounds clear to auscultation       Cardiovascular hypertension, Pt. on medications and Pt. on home beta blockers + CAD and +CHF   Rhythm:Regular Rate:Normal     Neuro/Psych TIACVA    GI/Hepatic Neg liver ROS, GERD  ,  Endo/Other  diabetes  Renal/GU CRFRenal disease     Musculoskeletal  (+) Arthritis ,   Abdominal   Peds  Hematology negative hematology ROS (+)   Anesthesia Other Findings   Reproductive/Obstetrics                           Lab Results  Component Value Date   WBC 7.9 01/30/2019   HGB 12.9 02/11/2019   HCT 38.0 02/11/2019   MCV 86.3 01/30/2019   PLT 284 01/30/2019   Lab Results  Component Value Date   CREATININE 4.47 (H) 01/30/2019   BUN 47 (H) 01/30/2019   NA 136 02/11/2019   K 4.5 02/11/2019   CL 102 01/30/2019   CO2 23 01/30/2019    Anesthesia Physical Anesthesia Plan  ASA: III  Anesthesia Plan: General   Post-op Pain Management:    Induction: Intravenous  PONV Risk Score and Plan: 3 and Dexamethasone, Ondansetron and Treatment may vary due to age or medical condition  Airway Management Planned: LMA  Additional Equipment:   Intra-op Plan:   Post-operative Plan: Extubation in OR  Informed Consent: I have reviewed the patients History and Physical, chart, labs and discussed the procedure including the risks, benefits and alternatives for the proposed anesthesia with the patient or authorized representative who has indicated his/her understanding and  acceptance.     Dental advisory given  Plan Discussed with: CRNA  Anesthesia Plan Comments: (   )       Anesthesia Quick Evaluation

## 2019-02-06 NOTE — Progress Notes (Signed)
Anesthesia Chart Review: Samantha Pierce    Case: [id number] Date/Time: 02/11/19 0857   Procedure: BASILIC VEIN TRANSPOSITION SECOND STAGE (Left )   Anesthesia type: Choice   Pre-op diagnosis: CHRONIC KIDNEY DISEASE   Location: [facility] OR ROOM 11 / [facility] OR   Surgeon: Waynetta Sandy, MD      DISCUSSION: Patient is a 48 year old female scheduled for the above procedure. She is s/p first stage left basilic vein transposition (BVT) fistula 11/20/18. She is not yet on hemodialysis.  History includes smoking, HTN, COPD, anemia, CVA (09/04/14), morbid obesity, dyspnea, DM2 (with neuropathy, retinopathy, nephropathy), PTSD, Bipolar disorder, seizure (related to uncontrolled HTN/DM), GERD, gait disturbance, CKD stage IV, non-obstructive CAD (05/2015). Positive UDS for cocaine and THC 01/19/19.  - ED visit 01/30/19 for SOB. Work-up showed no leukocytosis, normal troponin, resolving right basilar opacity,improving BNP, and no desaturation with ambulation. She was treated with IV Lasix and discharge with continued nephrology follow-up. - Admission 01/19/19-01/21/19 for acute diastolic CHF in the setting of CKD stage IV, partial medication compliance, and recent cocaine and marijuana use. Chest x-ray showed vascular congestion,and possible right lower lobe opacity. CT of the chest showed small bilateral pleural effusions with diffuse interstitial thickening predominantly bibasilar presumably from interstitial edema. BNP 320. Troponin normal. She was treated with IV Lasix and discharged on 40 mg po daily. Echo showed normal LVEF.  Of note, on 06/01/15 she developed bradycardia followed by asystole during a Lexiscan nuclear stress test requiring brief CODE. Cardiac cath showed only 30% LCX disease, so response felt likely "excessive pharmacologic response to the agent and likely superimposed vasovagal response."  Last cardiology evaluation was on 09/06/18 by Bernerd Pho, PA-C prior to first stage BVT. No  further cardiac testing recommended prior to that procedure. Per Zigmund Daniel at [facility], patient can continue Plavix perioperatively.  COVID-19 test is scheduled for 02/07/19. She is for labs and anesthesia team evaluation on arrival.    PROVIDERS: Rosita Fire, MD is PCP Rozann Lesches, MD is cardiologist [name], MD is nephrologist   LABS:  She is for updated labs on the day of surgery. As of 01/30/19, Cr 4.47, glucose 122, H/H 11.9/39.6, PLT 284.   IMAGES: CXR 01/30/19: IMPRESSION: Interval resolution of the previously seen right basilar opacity has resolved. Mild peribronchial thickening may reflect bronchitis.  CT Chest 01/19/19: IMPRESSION: 1. Cardiomegaly. 2. Small bilateral pleural effusions with adjacent compressive atelectasis. 3. Diffuse interstitial thickening, bibasilar predominant, presumably interstitial edema related to CHF/volume overload. In the absence of fever, atypical pneumonia is considered less likely. 4. No additional consolidation or ground-glass opacity to suggest pneumonia.    EKG: 01/30/19:  Normal sinus rhythm Possible Anterior infarct , age undetermined Abnormal ECG Confirmed by Nat Christen [phone]) on 01/31/2019 2:49:31 PM   CV: Echo 01/20/19: IMPRESSIONS  1. The left ventricle has normal systolic function with an ejection fraction of 60-65%. The cavity size was normal. There is moderately increased left ventricular wall thickness. Left ventricular diastolic Doppler parameters are indeterminate.  2. The right ventricle has normal systolic function. The cavity was normal. There is no increase in right ventricular wall thickness.  3. Left atrial size was severely dilated.  4. Right atrial size was mildly dilated.  5. No evidence of mitral valve stenosis.  6. The aortic valve is tricuspid. No stenosis of the aortic valve.  7. The aorta is normal unless otherwise noted.  8. There is dilatation of the aortic root. (3.10 cm)  9. Pulmonary  hypertension  is indeterminate, inadequate TR jet. 10. The inferior vena cava was dilated in size with >50% respiratory variability.  Cardiac cath 06/01/15: 1. Prox Cx lesion, 30% stenosed.  The coronary arteries are widely patent with minimal plaque noted. 40 cc of contrast was used  Left ventricular end-diastolic pressure is mildly elevated. Left ventriculography was not performed in an attempt to minimize contrast exposure.  Asystole during adenosine infusion represents an excessive pharmacologic response to the agent and likely superimposed vasovagal response.  Carotid US 05/23/15: Summary: Intimal wall thickening CCA. Mild mixed plaque origin ICA. 1-39% ICA plaquing. Vertebral artery flow is antegrade.   Past Medical History:  Diagnosis Date  . Anemia   . Anxiety   . Arthritis    left arm  . Asystole ([place])    a. 05/2015: pt developed bradycardia->asystole during [place] nuclear stress test, s/p brief code. Cath with only 30% prox LCx. Her asystole during Lexiscan infusion was felt to represent an excessive pharmacologic response to the agent and likely superimposed vasovagal response.  . Bipolar disorder ([place])   . CKD (chronic kidney disease) stage 3, GFR 30-59 ml/min (HCC)    stage 4, not on dialysis  . COPD (chronic obstructive pulmonary disease) ([place])   . Depression   . Diabetes mellitus with diabetic retinopathy    type 2  . Difficulty walking   . Dyspnea   . Essential hypertension   . GERD (gastroesophageal reflux disease)   . History of blood transfusion   . History of stroke April 2016   Acute lacunar infarct of the left thalamus  . History of stroke   . Leg cramps   . Mild CAD    a. LHC 05/2015: 30% prox Cx, otherwise widely patent.  . Morbid obesity ([place])   . Neuropathy    both arms, feet  . PTSD (post-traumatic stress disorder)   . PTSD (post-traumatic stress disorder)   . Seizures ([place])    has had one 12/26/15 - due to high blood sugar  . Stroke  [facility])    right arm weakness, decrease feeling in fingers right hand  . TIA (transient ischemic attack)     Past Surgical History:  Procedure Laterality Date  . AV FISTULA PLACEMENT Right 01/08/2017   Procedure: RIGHT ARM BRACHIOCEPHALIC ARTERIOVENOUS (AV) FISTULA CREATION;  Surgeon: Waynetta Sandy, MD;  Location: [facility];  Service: Vascular;  Laterality: Right;  . AV FISTULA PLACEMENT Left 11/20/2018   Procedure: Creation of LEFT ARM ARTERIOVENOUS (AV) FISTULA;  Surgeon: Waynetta Sandy, MD;  Location: [facility];  Service: Vascular;  Laterality: Left;  . CARDIAC CATHETERIZATION N/A 06/01/2015   Procedure: Left Heart Cath and Coronary Angiography;  Surgeon: Belva Crome, MD; CFX 30%, no other dz, EF nl by echo  . CESAREAN SECTION     x2  . DILATION AND CURETTAGE OF UTERUS    . EYE SURGERY     Right eye pars plano vitrectomy   . LASIK    . PARS PLANA VITRECTOMY  04/20/2011   Procedure: PARS PLANA VITRECTOMY WITH 25 GAUGE;  Surgeon: Hayden Pedro, MD;  Location: [facility];  Service: Ophthalmology;  Laterality: Left;  membrane peel, gas fluid exchange, endolaser, repair of complex retinal detachment  . TUBAL LIGATION      MEDICATIONS: No current facility-administered medications for this encounter.    Marland Kitchen albuterol (PROVENTIL HFA;VENTOLIN HFA) 108 (90 BASE) MCG/ACT inhaler  . amLODipine (NORVASC) 10 MG tablet  . atorvastatin (LIPITOR) 80 MG tablet  .  cloNIDine (CATAPRES) 0.2 MG tablet  . clopidogrel (PLAVIX) 75 MG tablet  . dorzolamide (TRUSOPT) 2 % ophthalmic solution  . FLUoxetine (PROZAC) 20 MG capsule  . furosemide (LASIX) 40 MG tablet  . gabapentin (NEURONTIN) 300 MG capsule  . hydrALAZINE (APRESOLINE) 25 MG tablet  . insulin aspart (NOVOLOG) 100 UNIT/ML FlexPen  . Insulin Detemir (LEVEMIR) 100 UNIT/ML Pen  . metoprolol succinate (TOPROL-XL) 50 MG 24 hr tablet  . omeprazole (PRILOSEC) 20 MG capsule  . sodium bicarbonate 650 MG tablet  . SYMBICORT 160-4.5 MCG/ACT  inhaler  . tiotropium (SPIRIVA) 18 MCG inhalation capsule  . traZODone (DESYREL) 100 MG tablet  As of 01/30/19, not taking Lipitor.   Myra Gianotti, PA-C Surgical Short Stay/Anesthesiology [facility] Phone [phone] [facility] Phone [phone] 02/06/2019 11:50 AM

## 2019-02-07 ENCOUNTER — Other Ambulatory Visit (HOSPITAL_COMMUNITY)
Admission: RE | Admit: 2019-02-07 | Discharge: 2019-02-07 | Disposition: A | Discharge status: Home / Self Care | Payer: Medicare HMO | Source: Ambulatory Visit | Attending: Vascular Surgery | Admitting: Vascular Surgery

## 2019-02-07 ENCOUNTER — Encounter (HOSPITAL_COMMUNITY): Payer: Self-pay | Admitting: *Deleted

## 2019-02-07 ENCOUNTER — Other Ambulatory Visit: Payer: Self-pay

## 2019-02-07 DIAGNOSIS — Z01812 Encounter for preprocedural laboratory examination: Secondary | ICD-10-CM | POA: Diagnosis present

## 2019-02-07 DIAGNOSIS — Z20828 Contact with and (suspected) exposure to other viral communicable diseases: Secondary | ICD-10-CM | POA: Diagnosis not present

## 2019-02-07 LAB — SARS CORONAVIRUS 2 (TAT 6-24 HRS): SARS Coronavirus 2: NEGATIVE

## 2019-02-07 MED ORDER — VANCOMYCIN HCL 10 G IV SOLR
1500.0000 mg | INTRAVENOUS | Status: AC
  Administered 2019-02-11: 1500 mg via INTRAVENOUS
  Filled 2019-02-07: qty 1500

## 2019-02-07 NOTE — Progress Notes (Signed)
Patient denies shortness of breath, fever, cough and chest pain.  PCP - Dr Rosita Fire Cardiologist - Dr Rozann Lesches Nephrologist-Dr [place]   Chest x-ray - 01/30/19 EKG - 01/30/19 Stress Test -Denies ECHO - 01/20/19 Cardiac Cath - 06/01/15  Fasting Blood Sugar -  70-90s Checks Blood Sugar __3-4___ times a day  THE NIGHT BEFORE SURGERY, take 50% of dinner/bedtime dose of Levemir insulin.      . If your CBG is greater than 220 mg/dL, you may take  of your sliding scale (correction) dose of Novolog insulin.  . If your blood sugar is less than 70 mg/dL, you will need to treat for low blood sugar: o Do not take insulin. o Treat a low blood sugar (less than 70 mg/dL) with  cup of clear juice (cranberry or apple), 4 glucose tablets, OR glucose gel. o Recheck blood sugar in 15 minutes after treatment (to make sure it is greater than 70 mg/dL). If your blood sugar is not greater than 70 mg/dL on recheck, call [phone] for further instructions.  Blood Thinner Instructions: Plavix - no need to stop per Kay@MD 's office/Allison,PA/Jan,RN. Patient to call office to see if she needs to take plavix on DOS.  Anesthesia review: Yes  STOP now taking any Aspirin (unless otherwise instructed by your surgeon), Aleve, Naproxen, Ibuprofen, Motrin, Advil, Goody's, BC's, all herbal medications, fish oil, and all vitamins.   Coronavirus Screening Have you or your sister experienced the following symptoms:  Cough yes/no: No Fever (>100.12F)  yes/no: No Runny nose yes/no: No Sore throat yes/no: No Difficulty breathing/shortness of breath  yes/no: No  Have you or your sister traveled in the last 14 days and where? yes/no: No

## 2019-02-11 ENCOUNTER — Ambulatory Visit (HOSPITAL_COMMUNITY)
Admission: RE | Admit: 2019-02-11 | Discharge: 2019-02-11 | Disposition: A | Discharge status: Home / Self Care | Payer: Medicare HMO | Attending: Vascular Surgery | Admitting: Vascular Surgery

## 2019-02-11 ENCOUNTER — Encounter (HOSPITAL_COMMUNITY)
Admission: RE | Disposition: A | Discharge status: Home / Self Care | Payer: Self-pay | Source: Home / Self Care | Attending: Vascular Surgery

## 2019-02-11 ENCOUNTER — Emergency Department (HOSPITAL_COMMUNITY)
Admission: EM | Admit: 2019-02-11 | Discharge: 2019-02-11 | Disposition: A | Discharge status: Home / Self Care | Payer: Medicare HMO | Source: Home / Self Care | Attending: Emergency Medicine | Admitting: Emergency Medicine

## 2019-02-11 ENCOUNTER — Encounter (HOSPITAL_COMMUNITY): Payer: Self-pay

## 2019-02-11 ENCOUNTER — Encounter (HOSPITAL_COMMUNITY): Payer: Self-pay | Admitting: Surgery

## 2019-02-11 ENCOUNTER — Ambulatory Visit (HOSPITAL_COMMUNITY): Payer: Medicare HMO | Admitting: Vascular Surgery

## 2019-02-11 ENCOUNTER — Other Ambulatory Visit: Payer: Self-pay

## 2019-02-11 DIAGNOSIS — Z880 Allergy status to penicillin: Secondary | ICD-10-CM | POA: Diagnosis not present

## 2019-02-11 DIAGNOSIS — F4310 Post-traumatic stress disorder, unspecified: Secondary | ICD-10-CM | POA: Insufficient documentation

## 2019-02-11 DIAGNOSIS — Z6841 Body Mass Index (BMI) 40.0 and over, adult: Secondary | ICD-10-CM | POA: Insufficient documentation

## 2019-02-11 DIAGNOSIS — E1122 Type 2 diabetes mellitus with diabetic chronic kidney disease: Secondary | ICD-10-CM | POA: Diagnosis not present

## 2019-02-11 DIAGNOSIS — E11319 Type 2 diabetes mellitus with unspecified diabetic retinopathy without macular edema: Secondary | ICD-10-CM | POA: Insufficient documentation

## 2019-02-11 DIAGNOSIS — N183 Chronic kidney disease, stage 3 unspecified: Secondary | ICD-10-CM

## 2019-02-11 DIAGNOSIS — J440 Chronic obstructive pulmonary disease with acute lower respiratory infection: Secondary | ICD-10-CM | POA: Insufficient documentation

## 2019-02-11 DIAGNOSIS — Z992 Dependence on renal dialysis: Secondary | ICD-10-CM | POA: Diagnosis not present

## 2019-02-11 DIAGNOSIS — K219 Gastro-esophageal reflux disease without esophagitis: Secondary | ICD-10-CM | POA: Insufficient documentation

## 2019-02-11 DIAGNOSIS — Z8673 Personal history of transient ischemic attack (TIA), and cerebral infarction without residual deficits: Secondary | ICD-10-CM | POA: Insufficient documentation

## 2019-02-11 DIAGNOSIS — F319 Bipolar disorder, unspecified: Secondary | ICD-10-CM | POA: Insufficient documentation

## 2019-02-11 DIAGNOSIS — J90 Pleural effusion, not elsewhere classified: Secondary | ICD-10-CM | POA: Diagnosis not present

## 2019-02-11 DIAGNOSIS — I509 Heart failure, unspecified: Secondary | ICD-10-CM | POA: Insufficient documentation

## 2019-02-11 DIAGNOSIS — I130 Hypertensive heart and chronic kidney disease with heart failure and stage 1 through stage 4 chronic kidney disease, or unspecified chronic kidney disease: Secondary | ICD-10-CM | POA: Diagnosis not present

## 2019-02-11 DIAGNOSIS — F419 Anxiety disorder, unspecified: Secondary | ICD-10-CM | POA: Diagnosis not present

## 2019-02-11 DIAGNOSIS — I2510 Atherosclerotic heart disease of native coronary artery without angina pectoris: Secondary | ICD-10-CM | POA: Diagnosis not present

## 2019-02-11 DIAGNOSIS — M1990 Unspecified osteoarthritis, unspecified site: Secondary | ICD-10-CM | POA: Insufficient documentation

## 2019-02-11 DIAGNOSIS — Z91041 Radiographic dye allergy status: Secondary | ICD-10-CM | POA: Insufficient documentation

## 2019-02-11 DIAGNOSIS — Z4889 Encounter for other specified surgical aftercare: Secondary | ICD-10-CM

## 2019-02-11 DIAGNOSIS — Z87891 Personal history of nicotine dependence: Secondary | ICD-10-CM | POA: Insufficient documentation

## 2019-02-11 DIAGNOSIS — Z841 Family history of disorders of kidney and ureter: Secondary | ICD-10-CM | POA: Insufficient documentation

## 2019-02-11 DIAGNOSIS — Z794 Long term (current) use of insulin: Secondary | ICD-10-CM | POA: Diagnosis not present

## 2019-02-11 DIAGNOSIS — Z7951 Long term (current) use of inhaled steroids: Secondary | ICD-10-CM | POA: Insufficient documentation

## 2019-02-11 DIAGNOSIS — N184 Chronic kidney disease, stage 4 (severe): Secondary | ICD-10-CM | POA: Insufficient documentation

## 2019-02-11 DIAGNOSIS — Z833 Family history of diabetes mellitus: Secondary | ICD-10-CM | POA: Diagnosis not present

## 2019-02-11 DIAGNOSIS — Z79899 Other long term (current) drug therapy: Secondary | ICD-10-CM | POA: Diagnosis not present

## 2019-02-11 DIAGNOSIS — N185 Chronic kidney disease, stage 5: Secondary | ICD-10-CM

## 2019-02-11 HISTORY — PX: BASCILIC VEIN TRANSPOSITION: SHX5742

## 2019-02-11 LAB — POCT I-STAT 4, (NA,K, GLUC, HGB,HCT)
Glucose, Bld: 248 mg/dL — ABNORMAL HIGH (ref 70–99)
HCT: 38 % (ref 36.0–46.0)
Hemoglobin: 12.9 g/dL (ref 12.0–15.0)
Potassium: 4.5 mmol/L (ref 3.5–5.1)
Sodium: 136 mmol/L (ref 135–145)

## 2019-02-11 LAB — GLUCOSE, CAPILLARY
Glucose-Capillary: 235 mg/dL — ABNORMAL HIGH (ref 70–99)
Glucose-Capillary: 211 mg/dL — ABNORMAL HIGH (ref 70–99)

## 2019-02-11 LAB — POCT PREGNANCY, URINE: Preg Test, Ur: NEGATIVE

## 2019-02-11 SURGERY — TRANSPOSITION, VEIN, BASILIC
Anesthesia: General | Laterality: Left

## 2019-02-11 MED ORDER — SODIUM CHLORIDE 0.9 % IV SOLN
INTRAVENOUS | Status: AC
  Filled 2019-02-11: qty 1.2

## 2019-02-11 MED ORDER — PROMETHAZINE HCL 25 MG/ML IJ SOLN
6.2500 mg | INTRAMUSCULAR | Status: DC | PRN
  Administered 2019-02-11: 13:00:00 6.25 mg via INTRAVENOUS

## 2019-02-11 MED ORDER — ONDANSETRON HCL 4 MG/2ML IJ SOLN
INTRAMUSCULAR | Status: DC | PRN
  Administered 2019-02-11: 4 mg via INTRAVENOUS

## 2019-02-11 MED ORDER — HYDROCODONE-ACETAMINOPHEN 5-325 MG PO TABS
1.0000 | ORAL_TABLET | Freq: Once | ORAL | Status: AC
  Administered 2019-02-11: 12:00:00 1 via ORAL

## 2019-02-11 MED ORDER — SODIUM CHLORIDE 0.9 % IV SOLN
INTRAVENOUS | Status: DC | PRN
  Administered 2019-02-11: 500 mL

## 2019-02-11 MED ORDER — PROPOFOL 10 MG/ML IV BOLUS
INTRAVENOUS | Status: DC | PRN
  Administered 2019-02-11: 80 mg via INTRAVENOUS
  Administered 2019-02-11: 200 mg via INTRAVENOUS

## 2019-02-11 MED ORDER — MIDAZOLAM HCL 5 MG/5ML IJ SOLN
INTRAMUSCULAR | Status: DC | PRN
  Administered 2019-02-11: 1 mg via INTRAVENOUS

## 2019-02-11 MED ORDER — LABETALOL HCL 5 MG/ML IV SOLN: 5.0000 mg | Freq: Once | INTRAVENOUS | Status: DC

## 2019-02-11 MED ORDER — PROMETHAZINE HCL 25 MG/ML IJ SOLN
INTRAMUSCULAR | Status: AC
  Filled 2019-02-11: qty 1

## 2019-02-11 MED ORDER — LIDOCAINE HCL (PF) 1 % IJ SOLN
INTRAMUSCULAR | Status: AC
  Filled 2019-02-11: qty 30

## 2019-02-11 MED ORDER — ONDANSETRON HCL 4 MG/2ML IJ SOLN
INTRAMUSCULAR | Status: AC
  Filled 2019-02-11: qty 2

## 2019-02-11 MED ORDER — FENTANYL CITRATE (PF) 100 MCG/2ML IJ SOLN
INTRAMUSCULAR | Status: AC
  Administered 2019-02-11: 25 ug via INTRAVENOUS
  Filled 2019-02-11: qty 2

## 2019-02-11 MED ORDER — LABETALOL HCL 5 MG/ML IV SOLN
INTRAVENOUS | Status: AC
  Filled 2019-02-11: qty 4

## 2019-02-11 MED ORDER — FENTANYL CITRATE (PF) 100 MCG/2ML IJ SOLN
INTRAMUSCULAR | Status: DC | PRN
  Administered 2019-02-11 (×2): 50 ug via INTRAVENOUS

## 2019-02-11 MED ORDER — FENTANYL CITRATE (PF) 250 MCG/5ML IJ SOLN
INTRAMUSCULAR | Status: AC
  Filled 2019-02-11: qty 5

## 2019-02-11 MED ORDER — SUCCINYLCHOLINE CHLORIDE 200 MG/10ML IV SOSY
PREFILLED_SYRINGE | INTRAVENOUS | Status: AC
  Filled 2019-02-11: qty 10

## 2019-02-11 MED ORDER — DEXAMETHASONE SODIUM PHOSPHATE 10 MG/ML IJ SOLN
INTRAMUSCULAR | Status: DC | PRN
  Administered 2019-02-11: 5 mg via INTRAVENOUS

## 2019-02-11 MED ORDER — HYDROCODONE-ACETAMINOPHEN 5-325 MG PO TABS: 1.0000 | ORAL_TABLET | Freq: Four times a day (QID) | ORAL | 0 refills | Status: DC | PRN

## 2019-02-11 MED ORDER — PHENYLEPHRINE 40 MCG/ML (10ML) SYRINGE FOR IV PUSH (FOR BLOOD PRESSURE SUPPORT)
PREFILLED_SYRINGE | INTRAVENOUS | Status: AC
  Filled 2019-02-11: qty 10

## 2019-02-11 MED ORDER — EPHEDRINE 5 MG/ML INJ
INTRAVENOUS | Status: AC
  Filled 2019-02-11: qty 10

## 2019-02-11 MED ORDER — PHENYLEPHRINE 40 MCG/ML (10ML) SYRINGE FOR IV PUSH (FOR BLOOD PRESSURE SUPPORT)
PREFILLED_SYRINGE | INTRAVENOUS | Status: DC | PRN
  Administered 2019-02-11: 40 ug via INTRAVENOUS

## 2019-02-11 MED ORDER — 0.9 % SODIUM CHLORIDE (POUR BTL) OPTIME
TOPICAL | Status: DC | PRN
  Administered 2019-02-11: 1000 mL

## 2019-02-11 MED ORDER — EPHEDRINE SULFATE-NACL 50-0.9 MG/10ML-% IV SOSY
PREFILLED_SYRINGE | INTRAVENOUS | Status: DC | PRN
  Administered 2019-02-11: 10 mg via INTRAVENOUS

## 2019-02-11 MED ORDER — LIDOCAINE 2% (20 MG/ML) 5 ML SYRINGE
INTRAMUSCULAR | Status: DC | PRN
  Administered 2019-02-11: 100 mg via INTRAVENOUS

## 2019-02-11 MED ORDER — LIDOCAINE-EPINEPHRINE (PF) 1 %-1:200000 IJ SOLN
INTRAMUSCULAR | Status: AC
  Filled 2019-02-11: qty 30

## 2019-02-11 MED ORDER — SODIUM CHLORIDE 0.9 % IV SOLN
INTRAVENOUS | Status: DC
  Administered 2019-02-11: 09:00:00 via INTRAVENOUS

## 2019-02-11 MED ORDER — MIDAZOLAM HCL 2 MG/2ML IJ SOLN
INTRAMUSCULAR | Status: AC
  Filled 2019-02-11: qty 2

## 2019-02-11 MED ORDER — DEXAMETHASONE SODIUM PHOSPHATE 10 MG/ML IJ SOLN
INTRAMUSCULAR | Status: AC
  Filled 2019-02-11: qty 1

## 2019-02-11 MED ORDER — FENTANYL CITRATE (PF) 100 MCG/2ML IJ SOLN
25.0000 ug | INTRAMUSCULAR | Status: DC | PRN
  Administered 2019-02-11 (×4): 25 ug via INTRAVENOUS

## 2019-02-11 MED ORDER — LIDOCAINE 2% (20 MG/ML) 5 ML SYRINGE
INTRAMUSCULAR | Status: AC
  Filled 2019-02-11: qty 5

## 2019-02-11 MED ORDER — HYDROCODONE-ACETAMINOPHEN 5-325 MG PO TABS
ORAL_TABLET | ORAL | Status: AC
  Administered 2019-02-11: 1 via ORAL
  Filled 2019-02-11: qty 1

## 2019-02-11 SURGICAL SUPPLY — 37 items
ARMBAND PINK RESTRICT EXTREMIT (MISCELLANEOUS) ×2 IMPLANT
BNDG ELASTIC 4X5.8 VLCR STR LF (GAUZE/BANDAGES/DRESSINGS) ×1 IMPLANT
CANISTER SUCT 3000ML PPV (MISCELLANEOUS) ×2 IMPLANT
CLIP VESOCCLUDE MED 24/CT (CLIP) IMPLANT
CLIP VESOCCLUDE MED 6/CT (CLIP) IMPLANT
CLIP VESOCCLUDE SM WIDE 24/CT (CLIP) ×1 IMPLANT
CLIP VESOCCLUDE SM WIDE 6/CT (CLIP) ×2 IMPLANT
COVER PROBE W GEL 5X96 (DRAPES) ×2 IMPLANT
COVER WAND RF STERILE (DRAPES) ×1 IMPLANT
DERMABOND ADVANCED (GAUZE/BANDAGES/DRESSINGS) ×1
DERMABOND ADVANCED .7 DNX12 (GAUZE/BANDAGES/DRESSINGS) ×1 IMPLANT
ELECT REM PT RETURN 9FT ADLT (ELECTROSURGICAL) ×2
ELECTRODE REM PT RTRN 9FT ADLT (ELECTROSURGICAL) ×1 IMPLANT
GLOVE BIO SURGEON STRL SZ 6.5 (GLOVE) ×2 IMPLANT
GLOVE BIO SURGEON STRL SZ7.5 (GLOVE) ×2 IMPLANT
GLOVE BIOGEL PI IND STRL 6.5 (GLOVE) IMPLANT
GLOVE BIOGEL PI IND STRL 7.0 (GLOVE) IMPLANT
GLOVE BIOGEL PI INDICATOR 6.5 (GLOVE) ×1
GLOVE BIOGEL PI INDICATOR 7.0 (GLOVE) ×2
GLOVE SURG SS PI 6.5 STRL IVOR (GLOVE) ×1 IMPLANT
GOWN STRL REUS W/ TWL LRG LVL3 (GOWN DISPOSABLE) ×2 IMPLANT
GOWN STRL REUS W/ TWL XL LVL3 (GOWN DISPOSABLE) ×1 IMPLANT
GOWN STRL REUS W/TWL LRG LVL3 (GOWN DISPOSABLE) ×3
GOWN STRL REUS W/TWL XL LVL3 (GOWN DISPOSABLE) ×1
KIT BASIN OR (CUSTOM PROCEDURE TRAY) ×2 IMPLANT
KIT TURNOVER KIT B (KITS) ×2 IMPLANT
NS IRRIG 1000ML POUR BTL (IV SOLUTION) ×2 IMPLANT
PACK CV ACCESS (CUSTOM PROCEDURE TRAY) ×2 IMPLANT
PAD ARMBOARD 7.5X6 YLW CONV (MISCELLANEOUS) ×4 IMPLANT
SUT MNCRL AB 4-0 PS2 18 (SUTURE) ×3 IMPLANT
SUT PROLENE 6 0 BV (SUTURE) ×2 IMPLANT
SUT SILK 2 0 SH (SUTURE) ×1 IMPLANT
SUT VIC AB 3-0 SH 27 (SUTURE) ×2
SUT VIC AB 3-0 SH 27X BRD (SUTURE) ×1 IMPLANT
TOWEL GREEN STERILE (TOWEL DISPOSABLE) ×2 IMPLANT
UNDERPAD 30X30 (UNDERPADS AND DIAPERS) ×2 IMPLANT
WATER STERILE IRR 1000ML POUR (IV SOLUTION) ×2 IMPLANT

## 2019-02-11 NOTE — Op Note (Signed)
    Patient name: Samantha Pierce MRN: [id number] DOB: 07/27/1970 Sex: female  02/11/2019 Pre-operative Diagnosis: Chronic kidney disease Post-operative diagnosis:  Same Surgeon:  Eda Paschal. Donzetta Matters, MD Assistant: Leontine Locket, PA Procedure Performed:   Left arm second stage basilic vein transposition fistula creation  Indications: 48 year old female with chronic kidney disease.  She has undergone a first stage basilic vein fistula on the left that has matured nicely and is indicated for the second stage.  Findings: There was a large well matured fistula.  At completion there is strong thrill and strong radial artery signal that did augment with compression of the fistula.   Procedure:  The patient was identified in the holding area and taken to the operating room where she is placed by operative general anesthesia induced.  She was sterilely prepped draped left upper extremity usual fashion antibiotics were administered timeout was called.  We began by opening her previous incision.  There was dense adherent scar.  We identified the nerve attempted to preserve it did have to divide 1 branch between clips.  We dissected up under a skin bridge created a second incision to the axilla.  We did divided branches tween ties.  We marked the vein for orientation.  We clamped it near the antecubitum and divided.  It was tunneled laterally flushed with heparinized saline.  We spatulated both and sewed them end-to-end with 6-0 Prolene.  Prior to completion anastomosis we allowed flushing in both directions.  We then completed our anastomosis.  There was strong thrill.  There was good signal at the radial artery at the wrist.  Satisfied with this we obtain hemostasis closed in layers Vicryl Monocryl.  Dermabond was placed to level skin and Ace wrap to protect the incisions.  She was awakened anesthesia having tolerated procedure without immediate complication.  EBL 50 cc  Niels Cranshaw C. Donzetta Matters, MD [facility] [place] Office: [phone] Pager: [phone]

## 2019-02-11 NOTE — Anesthesia Postprocedure Evaluation (Signed)
Anesthesia Post Note  Patient: Samantha Pierce  Procedure(s) Performed: BASILIC VEIN TRANSPOSITION SECOND STAGE (Left )     Patient location during evaluation: PACU Anesthesia Type: General Level of consciousness: awake and alert Pain management: pain level controlled Vital Signs Assessment: post-procedure vital signs reviewed and stable Respiratory status: spontaneous breathing, nonlabored ventilation, respiratory function stable and patient connected to nasal cannula oxygen Cardiovascular status: blood pressure returned to baseline and stable Postop Assessment: no apparent nausea or vomiting Anesthetic complications: no    Last Vitals:  Vitals:   02/11/19 1316 02/11/19 1330  BP: (!) 151/88 (!) 151/88  Pulse: 62 65  Resp: 17 18  Temp:  36.7 C  SpO2: 100% 95%    Last Pain:  Vitals:   02/11/19 1215  TempSrc:   PainSc: 7                  Tiajuana Amass

## 2019-02-11 NOTE — ED Triage Notes (Signed)
Pt had second part of fistula placement to left arm at [facility] today. Prior to arrival pt was at home when site started bleeding. EMS report CBG of 491- pt ate a burger prior. EMS reinforced pressure dressing to fistula site. Bleeding controlled at this time.

## 2019-02-11 NOTE — ED Notes (Signed)
Pt arm clean and dry with no bleeding  Wrapped arm and told pt to leave wrap on for tonight

## 2019-02-11 NOTE — H&P (Signed)
   History and Physical Update  The patient was interviewed and re-examined.  The patient's previous History and Physical has been reviewed and is unchanged from recent office visit. Plan for 2nd stage bvt.   Samantha Pierce C. Donzetta Matters, MD [facility] [place] Office: [phone] Pager: [phone]  02/11/2019, 8:20 AM

## 2019-02-11 NOTE — Transfer of Care (Signed)
Immediate Anesthesia Transfer of Care Note  Patient: Samantha Pierce  Procedure(s) Performed: BASILIC VEIN TRANSPOSITION SECOND STAGE (Left )  Patient Location: PACU  Anesthesia Type:General  Level of Consciousness: drowsy  Airway & Oxygen Therapy: Patient Spontanous Breathing and Patient connected to nasal cannula oxygen  Post-op Assessment: Report given to RN, Post -op Vital signs reviewed and stable and Patient moving all extremities  Post vital signs: Reviewed and stable  Last Vitals:  Vitals Value Taken Time  BP 162/73 02/11/19 1046  Temp    Pulse 86 02/11/19 1047  Resp 18 02/11/19 1048  SpO2 92 % 02/11/19 1047  Vitals shown include unvalidated device data.  Last Pain:  Vitals:   02/11/19 0737  TempSrc:   PainSc: 0-No pain      Patients Stated Pain Goal: 2 ([id number])  Complications: No apparent anesthesia complications

## 2019-02-11 NOTE — Anesthesia Procedure Notes (Signed)
Procedure Name: Intubation Date/Time: 02/11/2019 9:22 AM Performed by: Revanth Neidig T, CRNA Pre-anesthesia Checklist: Patient identified, Emergency Drugs available, Suction available and Patient being monitored Patient Re-evaluated:Patient Re-evaluated prior to induction Oxygen Delivery Method: Circle system utilized Preoxygenation: Pre-oxygenation with 100% oxygen Induction Type: IV induction Ventilation: Mask ventilation with difficulty and Two handed mask ventilation required Laryngoscope Size: Mac and 4 Grade View: Grade III Tube type: Oral Tube size: 7.5 mm Number of attempts: 1 Airway Equipment and Method: Patient positioned with wedge pillow and Stylet Placement Confirmation: ETT inserted through vocal cords under direct vision,  positive ETCO2 and breath sounds checked- equal and bilateral Secured at: 22 cm Tube secured with: Tape Dental Injury: Teeth and Oropharynx as per pre-operative assessment  Future Recommendations: Recommend- induction with short-acting agent, and alternative techniques readily available Comments: Unable to seat LMA. Convert to OETT

## 2019-02-11 NOTE — Discharge Instructions (Signed)
° °  [facility]  Discharge Instructions  AV Fistula or Graft Surgery for Dialysis Access  Please refer to the following instructions for your post-procedure care. Your surgeon or physician assistant will discuss any changes with you.  Activity  You may drive the day following your surgery, if you are comfortable and no longer taking prescription pain medication. Resume full activity as the soreness in your incision resolves.  Bathing/Showering  You may shower after you go home. Keep your incision dry for 48 hours. Do not soak in a bathtub, hot tub, or swim until the incision heals completely. You may not shower if you have a hemodialysis catheter.  Incision Care  Clean your incision with mild soap and water after 48 hours. Pat the area dry with a clean towel. You do not need a bandage unless otherwise instructed. Do not apply any ointments or creams to your incision. You may have skin glue on your incision. Do not peel it off. It will come off on its own in about one week. Your arm may swell a bit after surgery. To reduce swelling use pillows to elevate your arm so it is above your heart. Your doctor will tell you if you need to lightly wrap your arm with an ACE bandage.  Diet  Resume your normal diet. There are not special food restrictions following this procedure. In order to heal from your surgery, it is CRITICAL to get adequate nutrition. Your body requires vitamins, minerals, and protein. Vegetables are the best source of vitamins and minerals. Vegetables also provide the perfect balance of protein. Processed food has little nutritional value, so try to avoid this.  Medications  Resume taking all of your medications. If your incision is causing pain, you may take over-the counter pain relievers such as acetaminophen (Tylenol). If you were prescribed a stronger pain medication, please be aware these medications can cause nausea and constipation. Prevent  nausea by taking the medication with a snack or meal. Avoid constipation by drinking plenty of fluids and eating foods with high amount of fiber, such as fruits, vegetables, and grains.  Do not take Tylenol if you are taking prescription pain medications.  Follow up Your surgeon may want to see you in the office following your access surgery. If so, this will be arranged at the time of your surgery.  Please call us immediately for any of the following conditions:  Increased pain, redness, drainage (pus) from your incision site Fever of 101 degrees or higher Severe or worsening pain at your incision site Hand pain or numbness.  Reduce your risk of vascular disease:  Stop smoking. If you would like help, call [facility] at 1-800-QUIT-NOW 872-094-7065) or [place] at [place] your cholesterol Maintain a desired weight Control your diabetes Keep your blood pressure down  Dialysis  It will take several weeks to several months for your new dialysis access to be ready for use. Your surgeon will determine when it is okay to use it. Your nephrologist will continue to direct your dialysis. You can continue to use your Permcath until your new access is ready for use.   02/11/2019 Samantha Pierce [id number] [birth year]  Surgeon(s): Waynetta Sandy, MD  Procedure(s): LEFT BASILIC VEIN TRANSPOSITION SECOND STAGE  X Do not stick fistula for 6 weeks    If you have any questions, please call the office at [phone].

## 2019-02-11 NOTE — Discharge Instructions (Addendum)
Keep arm wrapped for the rest of the night. Wound care and follow-up otherwise as Dr Donzetta Matters recommended.

## 2019-02-12 ENCOUNTER — Other Ambulatory Visit: Payer: Self-pay

## 2019-02-12 ENCOUNTER — Encounter (HOSPITAL_COMMUNITY): Payer: Self-pay | Admitting: Vascular Surgery

## 2019-02-12 ENCOUNTER — Ambulatory Visit (INDEPENDENT_AMBULATORY_CARE_PROVIDER_SITE_OTHER): Payer: Self-pay | Admitting: Physician Assistant

## 2019-02-12 VITALS — BP 147/78 | HR 87 | Temp 97.9°F | Resp 14 | Ht 64.0 in | Wt 259.0 lb

## 2019-02-12 DIAGNOSIS — N183 Chronic kidney disease, stage 3 unspecified: Secondary | ICD-10-CM

## 2019-02-12 NOTE — Progress Notes (Signed)
    Postoperative Access Visit   History of Present Illness   Samantha Pierce is a 48 y.o. year old female who presents for postoperative follow-up for: left second stage basilic vein transposition by Dr. Donzetta Matters (Date: 02/11/19).  Patient states when she got home after surgery while making a sandwich she heard a pop and then felt blood dripping out of her arm.  She went to any [facility] emergency department where pressure was held and hemostasis was achieved.  Patient states she had some additional sanguinous oozing this morning from her arm.  She is able to tolerate the discomfort in her arm at this time.  She is not yet on dialysis.  She would like to avoid any further surgery if possible.  She has not been making an effort to elevate her arm or use any type of compression.  Physical Examination   Vitals:   02/12/19 1407  BP: (!) 147/78  Pulse: 87  Resp: 14  Temp: 97.9 F (36.6 C)  TempSrc: Temporal  SpO2: 98%  Weight: 259 lb (117.5 kg)  Height: 5\' 4"  (1.626 m)   Body mass index is 44.46 kg/m.  left arm  moderate edema left arm, forearm, and hand with local ecchymosis incisions of left upper arm; no active drainage or bleeding; palpable thrill in transposed basilic vein fistula; palpable left radial pulse    Medical Decision Making   Samantha Pierce is a 48 y.o. year old female who presents s/p left second stage basilic vein transposition  Patent fistula without signs or symptoms of steal syndrome.  Patient does not feel edema left arm has worsened over the past 12 hours.  We will try to manage this conservatively with Ace wrap from hand to shoulder.  She was also strongly encouraged to elevate her left arm throughout the day and exercise her left hand.  Steri-Strips applied to incision near antecubitum.  If pain becomes unbearable, if edema of left arm worsens, or if bleeding continues she will return to office or report to the emergency department.  She will follow-up with Dr. Donzetta Matters or  PA in 2 to 3 weeks.  Dagoberto Ligas PA-C [facility] [place] Office: [phone]  Clinic MD: Dr. Oneida Alar

## 2019-02-16 NOTE — ED Provider Notes (Signed)
[facility] EMERGENCY DEPARTMENT Provider Note   CSN: [id number] Arrival date & time: 02/11/19  1902     History   Chief Complaint Chief Complaint  Patient presents with  . Vascular Access Problem    bleeding    HPI Samantha Pierce is a 48 y.o. female.     HPI  9yF with bleeding from recent vascular surgery site LUE. Felt warmness in L arm when laid down prior to arrival to the ED this evening. Saw the blood and came to the ED. L arm pain but otherwise no other complaints. She is on plavix.   Past Medical History:  Diagnosis Date  . Anemia   . Anxiety   . Arthritis    left arm  . Asystole ([place])    a. 05/2015: pt developed bradycardia->asystole during [place] nuclear stress test, s/p brief code. Cath with only 30% prox LCx. Her asystole during Lexiscan infusion was felt to represent an excessive pharmacologic response to the agent and likely superimposed vasovagal response.  . Bipolar disorder ([place])   . CKD (chronic kidney disease) stage 3, GFR 30-59 ml/min (HCC)    stage 4, not on dialysis  . COPD (chronic obstructive pulmonary disease) ([place])   . Depression   . Diabetes mellitus with diabetic retinopathy    type 2  . Difficulty walking   . Dyspnea    with exertion  . Essential hypertension   . GERD (gastroesophageal reflux disease)   . History of blood transfusion   . History of blood transfusion    after c/s surgery  . History of stroke April 2016   Acute lacunar infarct of the left thalamus  . History of stroke   . Leg cramps   . Mild CAD    a. LHC 05/2015: 30% prox Cx, otherwise widely patent.  . Morbid obesity ([place])   . Neuropathy    both arms, feet  . PTSD (post-traumatic stress disorder)   . PTSD (post-traumatic stress disorder)   . Seizures ([place])    has had one 12/26/15 - due to high blood sugar  . Stroke [facility])    right arm weakness, decrease feeling in fingers right hand  . TIA (transient ischemic attack)     Patient Active Problem List   Diagnosis Date Noted  . Tobacco abuse 01/20/2019  . Cocaine abuse ([place]) 01/20/2019  . Uncontrolled type 2 diabetes mellitus with hyperglycemia, with long-term current use of insulin ([place]) 01/20/2019  . Acute diastolic CHF (congestive heart failure) ([place]) 01/20/2019  . Dyspnea 01/19/2019  . AKI (acute kidney injury) ([place]) 11/19/2017  . COPD (chronic obstructive pulmonary disease) ([place]) 11/19/2017  . GERD (gastroesophageal reflux disease) 11/19/2017  . Mild CAD 06/02/2015  . Bradycardia 06/02/2015  . Morbid obesity ([place])   . Asystole ([place])   . Pain in the chest   . Hyperlipidemia   . TIA (transient ischemic attack) 05/22/2015  . Diastolic dysfunction 99991111  . Essential hypertension 09/04/2014  . Uncontrolled diabetes mellitus with stage 4 chronic kidney disease ([place]) 09/04/2014  . Lacunar stroke, acute ([place]) 09/04/2014  . Paresthesias/numbness 09/04/2014  . CKD (chronic kidney disease), stage III ([place]) 09/04/2014  . Obesity 09/04/2014  . CVA (cerebral infarction) 09/04/2014  . Proliferative diabetic retinopathy of both eyes ([place]) 04/11/2011  . Retinal detachment, traction 04/11/2011    Past Surgical History:  Procedure Laterality Date  . AV FISTULA PLACEMENT Right 01/08/2017   Procedure: RIGHT ARM BRACHIOCEPHALIC ARTERIOVENOUS (AV) FISTULA CREATION;  Surgeon: Waynetta Sandy,  MD;  Location: [place];  Service: Vascular;  Laterality: Right;  . AV FISTULA PLACEMENT Left 11/20/2018   Procedure: Creation of LEFT ARM ARTERIOVENOUS (AV) FISTULA;  Surgeon: Waynetta Sandy, MD;  Location: [place];  Service: Vascular;  Laterality: Left;  . BASCILIC VEIN TRANSPOSITION Left 02/11/2019   Procedure: BASILIC VEIN TRANSPOSITION SECOND STAGE;  Surgeon: Waynetta Sandy, MD;  Location: [place];  Service: Vascular;  Laterality: Left;  . CARDIAC CATHETERIZATION N/A 06/01/2015   Procedure: Left Heart Cath and Coronary Angiography;  Surgeon: Belva Crome, MD; CFX 30%, no other  dz, EF nl by echo  . CESAREAN SECTION     x2  . DILATION AND CURETTAGE OF UTERUS    . EYE SURGERY     Right eye pars plano vitrectomy   . LASIK    . PARS PLANA VITRECTOMY  04/20/2011   Procedure: PARS PLANA VITRECTOMY WITH 25 GAUGE;  Surgeon: Hayden Pedro, MD;  Location: [place];  Service: Ophthalmology;  Laterality: Left;  membrane peel, gas fluid exchange, endolaser, repair of complex retinal detachment  . TUBAL LIGATION       OB History    Gravida  2   Para  2   Term  2   Preterm      AB      Living  2     SAB      TAB      Ectopic      Multiple      Live Births               Home Medications    Prior to Admission medications   Medication Sig Start Date End Date Taking? Authorizing Provider  HYDROcodone-acetaminophen (NORCO/VICODIN) 5-325 MG tablet Take 1 tablet by mouth every 6 (six) hours as needed for moderate pain. 02/11/19  Yes Rhyne, Samantha J, PA-C  albuterol (PROVENTIL HFA;VENTOLIN HFA) 108 (90 BASE) MCG/ACT inhaler Inhale 1-2 puffs into the lungs every 6 (six) hours as needed for wheezing or shortness of breath. Patient taking differently: Inhale 1-2 puffs into the lungs every 6 (six) hours as needed for shortness of breath.  06/20/14   Nat Christen, MD  amLODipine (NORVASC) 10 MG tablet Take 1 tablet (10 mg total) by mouth daily. 01/21/19   Orson Eva, MD  atorvastatin (LIPITOR) 80 MG tablet Take 1 tablet (80 mg total) by mouth daily at 6 PM. Patient not taking: Reported on 01/30/2019 08/05/15   Satira Sark, MD  cloNIDine (CATAPRES) 0.2 MG tablet Take 0.2 mg by mouth 2 (two) times daily.     [provider]  clopidogrel (PLAVIX) 75 MG tablet Take 1 tablet (75 mg total) by mouth daily. 05/24/15   Hongalgi, Lenis Dickinson, MD  dorzolamide (TRUSOPT) 2 % ophthalmic solution Place 1 drop into both eyes 2 (two) times daily as needed (eye pain.).     [provider]  FLUoxetine (PROZAC) 20 MG capsule Take 40 mg by mouth daily.     [provider]  furosemide (LASIX) 40 MG tablet Take 40 mg by mouth daily.     [provider]  gabapentin (NEURONTIN) 300 MG capsule Take 300 mg by mouth at bedtime.  02/07/18   [provider]  hydrALAZINE (APRESOLINE) 25 MG tablet Take 1 tablet (25 mg total) by mouth 2 (two) times daily. 01/21/19   Orson Eva, MD  insulin aspart (NOVOLOG) 100 UNIT/ML FlexPen Inject 10 Units into the skin 3 (three) times  daily before meals.     [provider]  Insulin Detemir (LEVEMIR) 100 UNIT/ML Pen Inject 50 Units into the skin at bedtime. Patient taking differently: Inject 48-80 Units into the skin at bedtime. Prescribed 80 units at bedtime. Patient takes half dose if small meal is eaten at supper 07/30/18   Cassandria Anger, MD  metoprolol succinate (TOPROL-XL) 50 MG 24 hr tablet Take 50 mg by mouth daily. 10/11/18   [provider]  omeprazole (PRILOSEC) 20 MG capsule Take 20 mg by mouth daily before breakfast.  02/07/18   [provider]  sodium bicarbonate 650 MG tablet Take 650 mg by mouth 2 (two) times daily.  11/19/18   [provider]  SYMBICORT 160-4.5 MCG/ACT inhaler Inhale 2 puffs into the lungs 2 (two) times daily.  02/07/18   [provider]  tiotropium (SPIRIVA) 18 MCG inhalation capsule Place 18 mcg into inhaler and inhale 3 (three) times a week.     [provider]  traZODone (DESYREL) 100 MG tablet Take 100 mg by mouth at bedtime.    [provider]    Family History Family History  Problem Relation Age of Onset  . Diabetes Mother   . Kidney failure Mother   . Diabetes Father   . Amblyopia Neg Hx   . Blindness Neg Hx   . Cataracts Neg Hx   . Glaucoma Neg Hx   . Macular degeneration Neg Hx   . Retinal detachment Neg Hx   . Strabismus Neg Hx   . Retinitis pigmentosa Neg Hx     Social History Social History   Tobacco Use  . Smoking status: Former Smoker    Packs/day: 0.03    Years: 30.00    Pack  years: 0.90    Types: Cigarettes    Start date: 06/05/1981    Quit date: 12/04/2018    Years since quitting: 0.2  . Smokeless tobacco: Never Used  Substance Use Topics  . Alcohol use: Yes    Alcohol/week: 0.0 standard drinks    Comment: occ wine  . Drug use: No     Allergies   Contrast media [iodinated diagnostic agents] and Penicillins   Review of Systems Review of Systems  All systems reviewed and negative, other than as noted in HPI.  Physical Exam Updated Vital Signs BP (!) 153/86 (BP Location: Right Wrist)   Pulse 69   Temp 98.8 F (37.1 C) (Oral)   Resp 19   Ht 5\' 4"  (1.626 m)   Wt 113.9 kg   LMP 01/12/2019 (Approximate)   SpO2 95%   BMI 43.08 kg/m   Physical Exam Vitals signs and nursing note reviewed.  Constitutional:      General: She is not in acute distress.    Appearance: She is well-developed. She is obese.  HENT:     Head: Normocephalic and atraumatic.  Eyes:     General:        Right eye: No discharge.        Left eye: No discharge.     Conjunctiva/sclera: Conjunctivae normal.  Neck:     Musculoskeletal: Neck supple.  Cardiovascular:     Rate and Rhythm: Normal rate and regular rhythm.     Heart sounds: Normal heart sounds. No murmur. No friction rub. No gallop.   Pulmonary:     Effort: Pulmonary effort is normal. No respiratory distress.     Breath sounds: Normal breath sounds.  Abdominal:     General:  There is no distension.     Palpations: Abdomen is soft.     Tenderness: There is no abdominal tenderness.  Musculoskeletal:        General: No tenderness.     Comments: Swelling and ecchymosis L upper arm that seems appropriate for recent procedure. Incisions are intact but small amount of dried blood noted at edges. nvi distally.   Skin:    General: Skin is warm and dry.  Neurological:     Mental Status: She is alert.  Psychiatric:        Behavior: Behavior normal.        Thought Content: Thought content normal.      ED Treatments  / Results  Labs (all labs ordered are listed, but only abnormal results are displayed) Labs Reviewed - No data to display  EKG None  Radiology No results found.  Procedures Procedures (including critical care time)  Medications Ordered in ED Medications - No data to display   Initial Impression / Assessment and Plan / ED Course  I have reviewed the triage vital signs and the nursing notes.  Pertinent labs & imaging results that were available during my care of the patient were reviewed by me and considered in my medical decision making (see chart for details).        47yf with some bleeding from incisions LUE after surgery earlier today. Suspect minimal and currently stopped. Incisions intact. Redressed. Pt advised to keep on overnight. Return precautions discussed. Post-op care per her vascular surgeon's recommendations otherwise.   Final Clinical Impressions(s) / ED Diagnoses   Final diagnoses:  Encounter for postoperative wound check    ED Discharge Orders    None       Virgel Manifold, MD 02/16/19 1327

## 2019-02-22 ENCOUNTER — Encounter (HOSPITAL_COMMUNITY): Payer: Self-pay | Admitting: *Deleted

## 2019-02-22 ENCOUNTER — Emergency Department (HOSPITAL_COMMUNITY)
Admission: EM | Admit: 2019-02-22 | Discharge: 2019-02-22 | Disposition: A | Discharge status: Home / Self Care | Payer: Medicare HMO | Attending: Emergency Medicine | Admitting: Emergency Medicine

## 2019-02-22 ENCOUNTER — Other Ambulatory Visit: Payer: Self-pay

## 2019-02-22 DIAGNOSIS — R2232 Localized swelling, mass and lump, left upper limb: Secondary | ICD-10-CM | POA: Diagnosis present

## 2019-02-22 DIAGNOSIS — J449 Chronic obstructive pulmonary disease, unspecified: Secondary | ICD-10-CM | POA: Insufficient documentation

## 2019-02-22 DIAGNOSIS — E1122 Type 2 diabetes mellitus with diabetic chronic kidney disease: Secondary | ICD-10-CM | POA: Insufficient documentation

## 2019-02-22 DIAGNOSIS — Z9889 Other specified postprocedural states: Secondary | ICD-10-CM | POA: Insufficient documentation

## 2019-02-22 DIAGNOSIS — Z87891 Personal history of nicotine dependence: Secondary | ICD-10-CM | POA: Diagnosis not present

## 2019-02-22 DIAGNOSIS — M7989 Other specified soft tissue disorders: Secondary | ICD-10-CM

## 2019-02-22 DIAGNOSIS — I129 Hypertensive chronic kidney disease with stage 1 through stage 4 chronic kidney disease, or unspecified chronic kidney disease: Secondary | ICD-10-CM | POA: Diagnosis not present

## 2019-02-22 DIAGNOSIS — Z79899 Other long term (current) drug therapy: Secondary | ICD-10-CM | POA: Diagnosis not present

## 2019-02-22 DIAGNOSIS — Z794 Long term (current) use of insulin: Secondary | ICD-10-CM | POA: Insufficient documentation

## 2019-02-22 DIAGNOSIS — N183 Chronic kidney disease, stage 3 (moderate): Secondary | ICD-10-CM | POA: Diagnosis not present

## 2019-02-22 LAB — CBC WITH DIFFERENTIAL/PLATELET
Abs Immature Granulocytes: 0.07 10*3/uL (ref 0.00–0.07)
Basophils Absolute: 0.1 10*3/uL (ref 0.0–0.1)
Basophils Relative: 1 %
Eosinophils Absolute: 0.3 10*3/uL (ref 0.0–0.5)
Eosinophils Relative: 3 %
HCT: 30.7 % — ABNORMAL LOW (ref 36.0–46.0)
Hemoglobin: 9.8 g/dL — ABNORMAL LOW (ref 12.0–15.0)
Immature Granulocytes: 1 %
Lymphocytes Relative: 17 %
Lymphs Abs: 1.7 10*3/uL (ref 0.7–4.0)
MCH: 26.9 pg (ref 26.0–34.0)
MCHC: 31.9 g/dL (ref 30.0–36.0)
MCV: 84.3 fL (ref 80.0–100.0)
Monocytes Absolute: 0.9 10*3/uL (ref 0.1–1.0)
Monocytes Relative: 9 %
Neutro Abs: 7 10*3/uL (ref 1.7–7.7)
Neutrophils Relative %: 69 %
Platelets: 452 10*3/uL — ABNORMAL HIGH (ref 150–400)
RBC: 3.64 MIL/uL — ABNORMAL LOW (ref 3.87–5.11)
RDW: 13.1 % (ref 11.5–15.5)
WBC: 9.9 10*3/uL (ref 4.0–10.5)
nRBC: 0 % (ref 0.0–0.2)

## 2019-02-22 LAB — URINALYSIS, ROUTINE W REFLEX MICROSCOPIC
Bacteria, UA: NONE SEEN
Bilirubin Urine: NEGATIVE
Glucose, UA: NEGATIVE mg/dL
Hgb urine dipstick: NEGATIVE
Ketones, ur: NEGATIVE mg/dL
Leukocytes,Ua: NEGATIVE
Nitrite: NEGATIVE
Protein, ur: >=300 mg/dL — AB
Specific Gravity, Urine: 1.011 (ref 1.005–1.030)
pH: 5 (ref 5.0–8.0)

## 2019-02-22 LAB — COMPREHENSIVE METABOLIC PANEL
ALT: 13 U/L (ref 0–44)
AST: 16 U/L (ref 15–41)
Albumin: 2.9 g/dL — ABNORMAL LOW (ref 3.5–5.0)
Alkaline Phosphatase: 60 U/L (ref 38–126)
Anion gap: 9 (ref 5–15)
BUN: 42 mg/dL — ABNORMAL HIGH (ref 6–20)
CO2: 23 mmol/L (ref 22–32)
Calcium: 8.7 mg/dL — ABNORMAL LOW (ref 8.9–10.3)
Chloride: 103 mmol/L (ref 98–111)
Creatinine, Ser: 4.09 mg/dL — ABNORMAL HIGH (ref 0.44–1.00)
GFR calc Af Amer: 14 mL/min — ABNORMAL LOW (ref >60)
GFR calc non Af Amer: 12 mL/min — ABNORMAL LOW (ref >60)
Glucose, Bld: 135 mg/dL — ABNORMAL HIGH (ref 70–99)
Potassium: 4.3 mmol/L (ref 3.5–5.1)
Sodium: 135 mmol/L (ref 135–145)
Total Bilirubin: 0.6 mg/dL (ref 0.3–1.2)
Total Protein: 6.8 g/dL (ref 6.5–8.1)

## 2019-02-22 LAB — I-STAT BETA HCG BLOOD, ED (MC, WL, AP ONLY): I-stat hCG, quantitative: <5 m[IU]/mL (ref <5)

## 2019-02-22 LAB — LACTIC ACID, PLASMA: Lactic Acid, Venous: 1.2 mmol/L (ref 0.5–1.9)

## 2019-02-22 MED ORDER — SODIUM CHLORIDE 0.9% FLUSH: 3.0000 mL | Freq: Once | INTRAVENOUS | Status: DC

## 2019-02-22 NOTE — Discharge Instructions (Addendum)
Follow up per vascular surgery at your appointment in October. 'Wound care per vascular.

## 2019-02-22 NOTE — Consult Note (Signed)
POST OPERATIVE OFFICE NOTE    Procedure Performed:   Left arm second stage basilic vein transposition fistula creation  Indications: 48 year old female with chronic kidney disease.  She has undergone a first stage basilic vein fistula on the left that has matured nicely and is indicated for the second stage.  She was last seen in our office by Arlee Muslim PA-C for wound problems with bleeding, after being seen at [facility] [place].   She got home after surgery while making a sandwich she heard a pop and then felt blood dripping out of her arm.  She went to any [facility] emergency department where pressure was held and hemostasis was achieved.   She was instructed to perform elevation and compression at home to help with the edema.  She states she has not been washing her arm, but the swelling has gone down some and she can move it better.  She denise fever and chills.  She is not on HD yet.  Allergies  Allergen Reactions  . Contrast Media [Iodinated Diagnostic Agents] Anaphylaxis    "code blue--I died"  . Penicillins Rash and Other (See Comments)    Has patient had a PCN reaction causing immediate rash, facial/tongue/throat swelling, SOB or lightheadedness with hypotension: Yes Has patient had a PCN reaction causing severe rash involving mucus membranes or skin necrosis: No Has patient had a PCN reaction that required hospitalization No Has patient had a PCN reaction occurring within the last 10 years: No If all of the above answers are "NO", then may proceed with Cephalosporin use.     Current Facility-Administered Medications  Medication Dose Route Frequency Provider Last Rate Last Dose  . sodium chloride flush (NS) 0.9 % injection 3 mL  3 mL Intravenous Once [name], MD       Current Outpatient Medications  Medication Sig Dispense Refill  . albuterol (PROVENTIL HFA;VENTOLIN HFA) 108 (90 BASE) MCG/ACT inhaler Inhale 1-2 puffs into the lungs every 6 (six) hours as needed for  wheezing or shortness of breath. (Patient taking differently: Inhale 1-2 puffs into the lungs every 6 (six) hours as needed for shortness of breath. ) 1 Inhaler 0  . amLODipine (NORVASC) 10 MG tablet Take 1 tablet (10 mg total) by mouth daily. 30 tablet 1  . atorvastatin (LIPITOR) 80 MG tablet Take 1 tablet (80 mg total) by mouth daily at 6 PM. (Patient not taking: Reported on 01/30/2019) 30 tablet 6  . cloNIDine (CATAPRES) 0.2 MG tablet Take 0.2 mg by mouth 2 (two) times daily.     . clopidogrel (PLAVIX) 75 MG tablet Take 1 tablet (75 mg total) by mouth daily. 30 tablet 0  . dorzolamide (TRUSOPT) 2 % ophthalmic solution Place 1 drop into both eyes 2 (two) times daily as needed (eye pain.).     Marland Kitchen FLUoxetine (PROZAC) 20 MG capsule Take 40 mg by mouth daily.     . furosemide (LASIX) 40 MG tablet Take 40 mg by mouth daily.     Marland Kitchen gabapentin (NEURONTIN) 300 MG capsule Take 300 mg by mouth at bedtime.     . hydrALAZINE (APRESOLINE) 25 MG tablet Take 1 tablet (25 mg total) by mouth 2 (two) times daily. 60 tablet 1  . HYDROcodone-acetaminophen (NORCO/VICODIN) 5-325 MG tablet Take 1 tablet by mouth every 6 (six) hours as needed for moderate pain. 20 tablet 0  . insulin aspart (NOVOLOG) 100 UNIT/ML FlexPen Inject 10 Units into the skin 3 (three) times daily before meals.     Marland Kitchen  Insulin Detemir (LEVEMIR) 100 UNIT/ML Pen Inject 50 Units into the skin at bedtime. (Patient taking differently: Inject 48-80 Units into the skin at bedtime. Prescribed 80 units at bedtime. Patient takes half dose if small meal is eaten at supper) 15 mL   . metoprolol succinate (TOPROL-XL) 50 MG 24 hr tablet Take 50 mg by mouth daily.    Marland Kitchen omeprazole (PRILOSEC) 20 MG capsule Take 20 mg by mouth daily before breakfast.     . sodium bicarbonate 650 MG tablet Take 650 mg by mouth 2 (two) times daily.     . SYMBICORT 160-4.5 MCG/ACT inhaler Inhale 2 puffs into the lungs 2 (two) times daily.     Marland Kitchen tiotropium (SPIRIVA) 18 MCG inhalation  capsule Place 18 mcg into inhaler and inhale 3 (three) times a week.     . traZODone (DESYREL) 100 MG tablet Take 100 mg by mouth at bedtime.       ROS:  See HPI  Physical Exam:    Incision:  Superficial separation of the proximal vein harvest incision.  No abnormal erythema or drainage.  Ecchymosis and mild firmness surround vein harvest incisions.  Evidence of hematoma.   Extremities:  Palpable radial pulse and palpable thrill in vein fistula left UE.  Left hand with good skin lines and no abnormal edema in hand and forearm.   Lungs non labored breathing  Heart RRR   Assessment/Plan:  This is a 48 y.o. female who is s/p:Left Basilic second stage fistula creation with vein transposition.   Resolving mild hematoma, decreased edema, palpable thrill in fistula and left radial pulse.  She was given dry dressing and ace wrap from the hand to the shoulder for compression.  She was instructed to cont. Elevation and compression until her F/U visit with our office.   Roxy Horseman PA-C  [facility] [phone]

## 2019-02-22 NOTE — ED Provider Notes (Signed)
[facility] EMERGENCY DEPARTMENT Provider Note   CSN: [id number] Arrival date & time: 02/22/19  1202     History   Chief Complaint Chief Complaint  Patient presents with  . Wound Check    HPI Samantha Pierce is a 48 y.o. female.     Patient with history of chronic kidney disease, high blood pressure, stroke, recent fistula placement on September 8 followed by hematoma for which she needed urgent attention by vascular presents with concern for possible infection.  Patient feels the swelling has gone down since she was seen last in the left upper arm.  No fevers, no vomiting.  No spreading redness.  Patient said there is a mild odor however that is been there since she left the hospital.  Patient said the bandages have not been changed since she left.  Patient has nursing check on her but not for wound care specifically.     Past Medical History:  Diagnosis Date  . Anemia   . Anxiety   . Arthritis    left arm  . Asystole ([place])    a. 05/2015: pt developed bradycardia->asystole during [facility] nuclear stress test, s/p brief code. Cath with only 30% prox LCx. Her asystole during Lexiscan infusion was felt to represent an excessive pharmacologic response to the agent and likely superimposed vasovagal response.  . Bipolar disorder ([place])   . CKD (chronic kidney disease) stage 3, GFR 30-59 ml/min (HCC)    stage 4, not on dialysis  . COPD (chronic obstructive pulmonary disease) ([place])   . Depression   . Diabetes mellitus with diabetic retinopathy    type 2  . Difficulty walking   . Dyspnea    with exertion  . Essential hypertension   . GERD (gastroesophageal reflux disease)   . History of blood transfusion   . History of blood transfusion    after c/s surgery  . History of stroke April 2016   Acute lacunar infarct of the left thalamus  . History of stroke   . Leg cramps   . Mild CAD    a. LHC 05/2015: 30% prox Cx, otherwise widely patent.  . Morbid  obesity ([place])   . Neuropathy    both arms, feet  . PTSD (post-traumatic stress disorder)   . PTSD (post-traumatic stress disorder)   . Seizures ([place])    has had one 12/26/15 - due to high blood sugar  . Stroke [facility])    right arm weakness, decrease feeling in fingers right hand  . TIA (transient ischemic attack)     Patient Active Problem List   Diagnosis Date Noted  . Tobacco abuse 01/20/2019  . Cocaine abuse ([place]) 01/20/2019  . Uncontrolled type 2 diabetes mellitus with hyperglycemia, with long-term current use of insulin ([place]) 01/20/2019  . Acute diastolic CHF (congestive heart failure) ([place]) 01/20/2019  . Dyspnea 01/19/2019  . AKI (acute kidney injury) ([place]) 11/19/2017  . COPD (chronic obstructive pulmonary disease) ([place]) 11/19/2017  . GERD (gastroesophageal reflux disease) 11/19/2017  . Mild CAD 06/02/2015  . Bradycardia 06/02/2015  . Morbid obesity ([place])   . Asystole ([place])   . Pain in the chest   . Hyperlipidemia   . TIA (transient ischemic attack) 05/22/2015  . Diastolic dysfunction 99991111  . Essential hypertension 09/04/2014  . Uncontrolled diabetes mellitus with stage 4 chronic kidney disease ([place]) 09/04/2014  . Lacunar stroke, acute ([place]) 09/04/2014  . Paresthesias/numbness 09/04/2014  . CKD (chronic kidney disease), stage III ([place]) 09/04/2014  .  Obesity 09/04/2014  . CVA (cerebral infarction) 09/04/2014  . Proliferative diabetic retinopathy of both eyes ([place]) 04/11/2011  . Retinal detachment, traction 04/11/2011    Past Surgical History:  Procedure Laterality Date  . AV FISTULA PLACEMENT Right 01/08/2017   Procedure: RIGHT ARM BRACHIOCEPHALIC ARTERIOVENOUS (AV) FISTULA CREATION;  Surgeon: Waynetta Sandy, MD;  Location: [place];  Service: Vascular;  Laterality: Right;  . AV FISTULA PLACEMENT Left 11/20/2018   Procedure: Creation of LEFT ARM ARTERIOVENOUS (AV) FISTULA;  Surgeon: Waynetta Sandy, MD;  Location: [place];  Service: Vascular;   Laterality: Left;  . BASCILIC VEIN TRANSPOSITION Left 02/11/2019   Procedure: BASILIC VEIN TRANSPOSITION SECOND STAGE;  Surgeon: Waynetta Sandy, MD;  Location: [place];  Service: Vascular;  Laterality: Left;  . CARDIAC CATHETERIZATION N/A 06/01/2015   Procedure: Left Heart Cath and Coronary Angiography;  Surgeon: Belva Crome, MD; CFX 30%, no other dz, EF nl by echo  . CESAREAN SECTION     x2  . DILATION AND CURETTAGE OF UTERUS    . EYE SURGERY     Right eye pars plano vitrectomy   . LASIK    . PARS PLANA VITRECTOMY  04/20/2011   Procedure: PARS PLANA VITRECTOMY WITH 25 GAUGE;  Surgeon: Hayden Pedro, MD;  Location: [place];  Service: Ophthalmology;  Laterality: Left;  membrane peel, gas fluid exchange, endolaser, repair of complex retinal detachment  . TUBAL LIGATION       OB History    Gravida  2   Para  2   Term  2   Preterm      AB      Living  2     SAB      TAB      Ectopic      Multiple      Live Births               Home Medications    Prior to Admission medications   Medication Sig Start Date End Date Taking? Authorizing Provider  albuterol (PROVENTIL HFA;VENTOLIN HFA) 108 (90 BASE) MCG/ACT inhaler Inhale 1-2 puffs into the lungs every 6 (six) hours as needed for wheezing or shortness of breath. Patient taking differently: Inhale 1-2 puffs into the lungs every 6 (six) hours as needed for shortness of breath.  06/20/14   Nat Christen, MD  amLODipine (NORVASC) 10 MG tablet Take 1 tablet (10 mg total) by mouth daily. 01/21/19   Orson Eva, MD  atorvastatin (LIPITOR) 80 MG tablet Take 1 tablet (80 mg total) by mouth daily at 6 PM. Patient not taking: Reported on 01/30/2019 08/05/15   Satira Sark, MD  cloNIDine (CATAPRES) 0.2 MG tablet Take 0.2 mg by mouth 2 (two) times daily.     [provider]  clopidogrel (PLAVIX) 75 MG tablet Take 1 tablet (75 mg total) by mouth daily. 05/24/15   Hongalgi, Lenis Dickinson, MD  dorzolamide (TRUSOPT) 2 %  ophthalmic solution Place 1 drop into both eyes 2 (two) times daily as needed (eye pain.).     [provider]  FLUoxetine (PROZAC) 20 MG capsule Take 40 mg by mouth daily.     [provider]  furosemide (LASIX) 40 MG tablet Take 40 mg by mouth daily.     [provider]  gabapentin (NEURONTIN) 300 MG capsule Take 300 mg by mouth at bedtime.  02/07/18   [provider]  hydrALAZINE (APRESOLINE) 25 MG tablet Take 1 tablet (25 mg  total) by mouth 2 (two) times daily. 01/21/19   Orson Eva, MD  HYDROcodone-acetaminophen (NORCO/VICODIN) 5-325 MG tablet Take 1 tablet by mouth every 6 (six) hours as needed for moderate pain. 02/11/19   Rhyne, Hulen Shouts, PA-C  insulin aspart (NOVOLOG) 100 UNIT/ML FlexPen Inject 10 Units into the skin 3 (three) times daily before meals.     [provider]  Insulin Detemir (LEVEMIR) 100 UNIT/ML Pen Inject 50 Units into the skin at bedtime. Patient taking differently: Inject 48-80 Units into the skin at bedtime. Prescribed 80 units at bedtime. Patient takes half dose if small meal is eaten at supper 07/30/18   Cassandria Anger, MD  metoprolol succinate (TOPROL-XL) 50 MG 24 hr tablet Take 50 mg by mouth daily. 10/11/18   [provider]  omeprazole (PRILOSEC) 20 MG capsule Take 20 mg by mouth daily before breakfast.  02/07/18   [provider]  sodium bicarbonate 650 MG tablet Take 650 mg by mouth 2 (two) times daily.  11/19/18   [provider]  SYMBICORT 160-4.5 MCG/ACT inhaler Inhale 2 puffs into the lungs 2 (two) times daily.  02/07/18   [provider]  tiotropium (SPIRIVA) 18 MCG inhalation capsule Place 18 mcg into inhaler and inhale 3 (three) times a week.     [provider]  traZODone (DESYREL) 100 MG tablet Take 100 mg by mouth at bedtime.    [provider]    Family History Family History  Problem Relation Age of Onset  . Diabetes Mother   . Kidney failure Mother   .  Diabetes Father   . Amblyopia Neg Hx   . Blindness Neg Hx   . Cataracts Neg Hx   . Glaucoma Neg Hx   . Macular degeneration Neg Hx   . Retinal detachment Neg Hx   . Strabismus Neg Hx   . Retinitis pigmentosa Neg Hx     Social History Social History   Tobacco Use  . Smoking status: Former Smoker    Packs/day: 0.03    Years: 30.00    Pack years: 0.90    Types: Cigarettes    Start date: 06/05/1981    Quit date: 12/04/2018    Years since quitting: 0.2  . Smokeless tobacco: Never Used  Substance Use Topics  . Alcohol use: Yes    Alcohol/week: 0.0 standard drinks    Comment: occ wine  . Drug use: No     Allergies   Contrast media [iodinated diagnostic agents] and Penicillins   Review of Systems Review of Systems  Constitutional: Negative for chills and fever.  HENT: Negative for congestion.   Eyes: Negative for visual disturbance.  Respiratory: Positive for shortness of breath (chronic).   Cardiovascular: Negative for chest pain.  Gastrointestinal: Negative for abdominal pain and vomiting.  Genitourinary: Negative for dysuria and flank pain.  Musculoskeletal: Positive for joint swelling. Negative for back pain, neck pain and neck stiffness.  Skin: Positive for wound. Negative for rash.  Neurological: Positive for light-headedness. Negative for headaches.     Physical Exam Updated Vital Signs BP (!) 144/47 (BP Location: Right Arm)   Pulse (!) 59   Temp 98.1 F (36.7 C) (Oral)   Resp 18   Ht 5\' 4"  (1.626 m)   Wt 117.5 kg   LMP 02/12/2019   SpO2 95%   BMI 44.46 kg/m   Physical Exam Vitals signs and nursing note reviewed.  Constitutional:      Appearance: She is well-developed.  HENT:     Head: Normocephalic and atraumatic.  Eyes:     General:        Right eye: No discharge.        Left eye: No discharge.     Conjunctiva/sclera: Conjunctivae normal.  Neck:     Musculoskeletal: Normal range of motion and neck supple.     Trachea: No tracheal deviation.   Cardiovascular:     Rate and Rhythm: Normal rate.  Pulmonary:     Effort: Pulmonary effort is normal.  Abdominal:     General: There is no distension.     Palpations: Abdomen is soft.     Tenderness: There is no abdominal tenderness. There is no guarding.  Musculoskeletal:        General: Swelling and tenderness present. No signs of injury.  Skin:    General: Skin is warm.     Findings: No rash.     Comments: Patient has moderate edema left upper extremity and to healing surgical wounds from fistula.  Patient has bandages in place on both wounds without active drainage.  Minimal tenderness to palpation no induration however significant swelling from recent hematoma.  Compartments soft.  No spreading erythema and minimal warmth.  Neurological:     Mental Status: She is alert and oriented to person, place, and time.      ED Treatments / Results  Labs (all labs ordered are listed, but only abnormal results are displayed) Labs Reviewed  COMPREHENSIVE METABOLIC PANEL - Abnormal; Notable for the following components:      Result Value   Glucose, Bld 135 (*)    BUN 42 (*)    Creatinine, Ser 4.09 (*)    Calcium 8.7 (*)    Albumin 2.9 (*)    GFR calc non Af Amer 12 (*)    GFR calc Af Amer 14 (*)    All other components within normal limits  CBC WITH DIFFERENTIAL/PLATELET - Abnormal; Notable for the following components:   RBC 3.64 (*)    Hemoglobin 9.8 (*)    HCT 30.7 (*)    Platelets 452 (*)    All other components within normal limits  URINALYSIS, ROUTINE W REFLEX MICROSCOPIC - Abnormal; Notable for the following components:   APPearance CLOUDY (*)    Protein, ur >=300 (*)    All other components within normal limits  LACTIC ACID, PLASMA  LACTIC ACID, PLASMA  I-STAT BETA HCG BLOOD, ED ([facility], [facility], [facility] ONLY)    EKG None  Radiology No results found.  Procedures Procedures (including critical care time)  Medications Ordered in ED Medications  sodium chloride flush (NS)  0.9 % injection 3 mL (0 mLs Intravenous Hold 02/22/19 1721)     Initial Impression / Assessment and Plan / ED Course  I have reviewed the triage vital signs and the nursing notes.  Pertinent labs & imaging results that were available during my care of the patient were reviewed by me and considered in my medical decision making (see chart for details).       Patient presents for left arm assessment for possible infection.  Patient has no active drainage, no fever chills, normal white blood cell count.  Low suspicion for infection and I feel this is likely hematoma.  Discussed with vascular surgeon physician assistant will come down to assess and likely arrange outpatient follow-up with wound care. UA no acute findings.  Cr 4.0.    Vascular evaluated in ED and arranged outpatient follow up.  Final Clinical Impressions(s) / ED Diagnoses   Final diagnoses:  Left arm swelling    ED Discharge Orders    None       [name], MD 02/22/19 1733

## 2019-02-22 NOTE — ED Triage Notes (Signed)
Pt states has same dressing on recently placed L arm fistula since 9/9.  She feels it's infected.

## 2019-02-22 NOTE — ED Notes (Signed)
Left upper arm is swollen, discolored and warm to touch. Steri-strips and band aid placed over fistula incision site.

## 2019-02-26 ENCOUNTER — Ambulatory Visit: Payer: Medicare HMO | Admitting: Vascular Surgery

## 2019-02-26 ENCOUNTER — Other Ambulatory Visit: Payer: Self-pay

## 2019-02-26 DIAGNOSIS — S41102D Unspecified open wound of left upper arm, subsequent encounter: Secondary | ICD-10-CM

## 2019-02-26 DIAGNOSIS — N184 Chronic kidney disease, stage 4 (severe): Secondary | ICD-10-CM

## 2019-02-26 NOTE — Progress Notes (Signed)
Pt walked in with a family member concerned because she is still having swelling in her arm and she there is part of the incision that she said is open and oozing blood.   Spoke with Dr Oneida Alar and he was ok with me evaluating wound and redressing as needed.   Pts arm does still have swelling but it has improved since she was in the office last as I dressed her myself when she was here. There was an approximately 2inch area of the incision that was slightly open and dripping a little blood. Not significant oozing, no pus and no heat to the touch. Reapplied steri strips to this area and covered with a sterile 4x4 to catch what little blood was coming through until it had time to clot. Also applied some antibiotic ointment to an area that seemed to have become a little raw in the crease of the inside of her elbow from the way the ace was wrapped around her arm.   Applied 2 Ace bandages to her arm for compression and told her that when she changes it she needs to try to wrap it like I had it where it was smooth and not creased in that area and from her hand to above the incision. We discussed where she should hold her arm in order to have it elevated appropriately and to squeeze on a rag or stress ball to help with the swelling.   Advised her of things to look for with changes in the wound that would warrant her calling the office before her appointment to be seen. She states that she understands this. She was advised to be sure to keep her follow up appt next week.   York Cerise, CMA

## 2019-03-02 ENCOUNTER — Other Ambulatory Visit: Payer: Self-pay

## 2019-03-02 ENCOUNTER — Inpatient Hospital Stay (HOSPITAL_COMMUNITY)
Admission: EM | Admit: 2019-03-02 | Discharge: 2019-03-06 | DRG: 920 | Disposition: A | Discharge status: Home Health | Payer: Medicare HMO | Attending: Vascular Surgery | Admitting: Vascular Surgery

## 2019-03-02 ENCOUNTER — Encounter (HOSPITAL_COMMUNITY): Payer: Self-pay

## 2019-03-02 DIAGNOSIS — T8131XA Disruption of external operation (surgical) wound, not elsewhere classified, initial encounter: Principal | ICD-10-CM | POA: Diagnosis present

## 2019-03-02 DIAGNOSIS — F4310 Post-traumatic stress disorder, unspecified: Secondary | ICD-10-CM | POA: Diagnosis present

## 2019-03-02 DIAGNOSIS — T8131XD Disruption of external operation (surgical) wound, not elsewhere classified, subsequent encounter: Secondary | ICD-10-CM | POA: Diagnosis not present

## 2019-03-02 DIAGNOSIS — M1909 Primary osteoarthritis, other specified site: Secondary | ICD-10-CM | POA: Diagnosis present

## 2019-03-02 DIAGNOSIS — Z794 Long term (current) use of insulin: Secondary | ICD-10-CM

## 2019-03-02 DIAGNOSIS — E11319 Type 2 diabetes mellitus with unspecified diabetic retinopathy without macular edema: Secondary | ICD-10-CM | POA: Diagnosis present

## 2019-03-02 DIAGNOSIS — Z91041 Radiographic dye allergy status: Secondary | ICD-10-CM

## 2019-03-02 DIAGNOSIS — I129 Hypertensive chronic kidney disease with stage 1 through stage 4 chronic kidney disease, or unspecified chronic kidney disease: Secondary | ICD-10-CM | POA: Diagnosis present

## 2019-03-02 DIAGNOSIS — Z7951 Long term (current) use of inhaled steroids: Secondary | ICD-10-CM

## 2019-03-02 DIAGNOSIS — Z833 Family history of diabetes mellitus: Secondary | ICD-10-CM

## 2019-03-02 DIAGNOSIS — J449 Chronic obstructive pulmonary disease, unspecified: Secondary | ICD-10-CM | POA: Diagnosis present

## 2019-03-02 DIAGNOSIS — Z79899 Other long term (current) drug therapy: Secondary | ICD-10-CM

## 2019-03-02 DIAGNOSIS — Y838 Other surgical procedures as the cause of abnormal reaction of the patient, or of later complication, without mention of misadventure at the time of the procedure: Secondary | ICD-10-CM | POA: Diagnosis present

## 2019-03-02 DIAGNOSIS — E1122 Type 2 diabetes mellitus with diabetic chronic kidney disease: Secondary | ICD-10-CM | POA: Diagnosis present

## 2019-03-02 DIAGNOSIS — Z6841 Body Mass Index (BMI) 40.0 and over, adult: Secondary | ICD-10-CM

## 2019-03-02 DIAGNOSIS — F419 Anxiety disorder, unspecified: Secondary | ICD-10-CM | POA: Diagnosis present

## 2019-03-02 DIAGNOSIS — Z7902 Long term (current) use of antithrombotics/antiplatelets: Secondary | ICD-10-CM

## 2019-03-02 DIAGNOSIS — Z20828 Contact with and (suspected) exposure to other viral communicable diseases: Secondary | ICD-10-CM | POA: Diagnosis present

## 2019-03-02 DIAGNOSIS — Z880 Allergy status to penicillin: Secondary | ICD-10-CM

## 2019-03-02 DIAGNOSIS — Z841 Family history of disorders of kidney and ureter: Secondary | ICD-10-CM

## 2019-03-02 DIAGNOSIS — Z87891 Personal history of nicotine dependence: Secondary | ICD-10-CM

## 2019-03-02 DIAGNOSIS — F319 Bipolar disorder, unspecified: Secondary | ICD-10-CM | POA: Diagnosis present

## 2019-03-02 DIAGNOSIS — E785 Hyperlipidemia, unspecified: Secondary | ICD-10-CM | POA: Diagnosis present

## 2019-03-02 DIAGNOSIS — N184 Chronic kidney disease, stage 4 (severe): Secondary | ICD-10-CM | POA: Diagnosis present

## 2019-03-02 DIAGNOSIS — K219 Gastro-esophageal reflux disease without esophagitis: Secondary | ICD-10-CM | POA: Diagnosis present

## 2019-03-02 DIAGNOSIS — S40022A Contusion of left upper arm, initial encounter: Secondary | ICD-10-CM | POA: Diagnosis present

## 2019-03-02 DIAGNOSIS — Z8673 Personal history of transient ischemic attack (TIA), and cerebral infarction without residual deficits: Secondary | ICD-10-CM

## 2019-03-02 LAB — CBC WITH DIFFERENTIAL/PLATELET
Abs Immature Granulocytes: 0.08 10*3/uL — ABNORMAL HIGH (ref 0.00–0.07)
Basophils Absolute: 0.1 10*3/uL (ref 0.0–0.1)
Basophils Relative: 0 %
Eosinophils Absolute: 0.1 10*3/uL (ref 0.0–0.5)
Eosinophils Relative: 1 %
HCT: 27.9 % — ABNORMAL LOW (ref 36.0–46.0)
Hemoglobin: 8.4 g/dL — ABNORMAL LOW (ref 12.0–15.0)
Immature Granulocytes: 1 %
Lymphocytes Relative: 6 %
Lymphs Abs: 0.8 10*3/uL (ref 0.7–4.0)
MCH: 25.9 pg — ABNORMAL LOW (ref 26.0–34.0)
MCHC: 30.1 g/dL (ref 30.0–36.0)
MCV: 86.1 fL (ref 80.0–100.0)
Monocytes Absolute: 1.8 10*3/uL — ABNORMAL HIGH (ref 0.1–1.0)
Monocytes Relative: 14 %
Neutro Abs: 10.3 10*3/uL — ABNORMAL HIGH (ref 1.7–7.7)
Neutrophils Relative %: 78 %
Platelets: 312 10*3/uL (ref 150–400)
RBC: 3.24 MIL/uL — ABNORMAL LOW (ref 3.87–5.11)
RDW: 13.1 % (ref 11.5–15.5)
WBC: 13.1 10*3/uL — ABNORMAL HIGH (ref 4.0–10.5)
nRBC: 0 % (ref 0.0–0.2)

## 2019-03-02 LAB — BASIC METABOLIC PANEL
Anion gap: 10 (ref 5–15)
BUN: 46 mg/dL — ABNORMAL HIGH (ref 6–20)
CO2: 19 mmol/L — ABNORMAL LOW (ref 22–32)
Calcium: 7.9 mg/dL — ABNORMAL LOW (ref 8.9–10.3)
Chloride: 97 mmol/L — ABNORMAL LOW (ref 98–111)
Creatinine, Ser: 4.06 mg/dL — ABNORMAL HIGH (ref 0.44–1.00)
GFR calc Af Amer: 14 mL/min — ABNORMAL LOW (ref >60)
GFR calc non Af Amer: 12 mL/min — ABNORMAL LOW (ref >60)
Glucose, Bld: 336 mg/dL — ABNORMAL HIGH (ref 70–99)
Potassium: 4.2 mmol/L (ref 3.5–5.1)
Sodium: 126 mmol/L — ABNORMAL LOW (ref 135–145)

## 2019-03-02 NOTE — ED Triage Notes (Signed)
Pt reports fistula to left arm (newly placed dialysis port) started bleeding this evening. Pt saw surgeon this past Tuesday and the check up was good per pt. Pt says she has saw surgeon three times since fistula was placed. Bleeding controlled with pressure dressing.

## 2019-03-02 NOTE — ED Notes (Signed)
Applied new pressure dressing to upper left arm in an attempt to slow bleeding.

## 2019-03-02 NOTE — ED Provider Notes (Signed)
[facility] EMERGENCY DEPARTMENT Provider Note   CSN: [id number] Arrival date & time: 03/02/19  2126     History   Chief Complaint Chief Complaint  Patient presents with  . Vascular Access Problem    bleeding    HPI Samantha Pierce is a 48 y.o. female.     Patient presents to the emergency department for evaluation of bleeding from her recently placed dialysis fistula.  Patient reports having a fistula placed in her left upper arm on September 8.  She reports that she has had a hematoma formation as well as a small dehiscence of the wound, has been seen several times by her surgeon since it was placed.  Tonight she suddenly had significant bleeding from the area without any known trauma.  She has not had any concern for infection with past visits, but reports tonight she noticed a foul odor coming from the wound.     Past Medical History:  Diagnosis Date  . Anemia   . Anxiety   . Arthritis    left arm  . Asystole ([place])    a. 05/2015: pt developed bradycardia->asystole during [place] nuclear stress test, s/p brief code. Cath with only 30% prox LCx. Her asystole during Lexiscan infusion was felt to represent an excessive pharmacologic response to the agent and likely superimposed vasovagal response.  . Bipolar disorder ([place])   . CKD (chronic kidney disease) stage 3, GFR 30-59 ml/min (HCC)    stage 4, not on dialysis  . COPD (chronic obstructive pulmonary disease) ([place])   . Depression   . Diabetes mellitus with diabetic retinopathy    type 2  . Difficulty walking   . Dyspnea    with exertion  . Essential hypertension   . GERD (gastroesophageal reflux disease)   . History of blood transfusion   . History of blood transfusion    after c/s surgery  . History of stroke April 2016   Acute lacunar infarct of the left thalamus  . History of stroke   . Leg cramps   . Mild CAD    a. LHC 05/2015: 30% prox Cx, otherwise widely patent.  . Morbid obesity ([place])   . Neuropathy     both arms, feet  . PTSD (post-traumatic stress disorder)   . PTSD (post-traumatic stress disorder)   . Seizures ([place])    has had one 12/26/15 - due to high blood sugar  . Stroke [facility])    right arm weakness, decrease feeling in fingers right hand  . TIA (transient ischemic attack)     Patient Active Problem List   Diagnosis Date Noted  . Chronic kidney disease (CKD), stage IV (severe) ([place]) 02/26/2019  . Tobacco abuse 01/20/2019  . Cocaine abuse ([place]) 01/20/2019  . Uncontrolled type 2 diabetes mellitus with hyperglycemia, with long-term current use of insulin ([place]) 01/20/2019  . Acute diastolic CHF (congestive heart failure) ([place]) 01/20/2019  . Dyspnea 01/19/2019  . AKI (acute kidney injury) ([place]) 11/19/2017  . COPD (chronic obstructive pulmonary disease) ([place]) 11/19/2017  . GERD (gastroesophageal reflux disease) 11/19/2017  . Mild CAD 06/02/2015  . Bradycardia 06/02/2015  . Morbid obesity ([place])   . Asystole ([place])   . Pain in the chest   . Hyperlipidemia   . TIA (transient ischemic attack) 05/22/2015  . Diastolic dysfunction 99991111  . Essential hypertension 09/04/2014  . Uncontrolled diabetes mellitus with stage 4 chronic kidney disease ([place]) 09/04/2014  . Lacunar stroke, acute ([place]) 09/04/2014  . Paresthesias/numbness 09/04/2014  .  CKD (chronic kidney disease), stage III ([place]) 09/04/2014  . Obesity 09/04/2014  . CVA (cerebral infarction) 09/04/2014  . Proliferative diabetic retinopathy of both eyes ([place]) 04/11/2011  . Retinal detachment, traction 04/11/2011    Past Surgical History:  Procedure Laterality Date  . AV FISTULA PLACEMENT Right 01/08/2017   Procedure: RIGHT ARM BRACHIOCEPHALIC ARTERIOVENOUS (AV) FISTULA CREATION;  Surgeon: Waynetta Sandy, MD;  Location: [place];  Service: Vascular;  Laterality: Right;  . AV FISTULA PLACEMENT Left 11/20/2018   Procedure: Creation of LEFT ARM ARTERIOVENOUS (AV) FISTULA;  Surgeon: Waynetta Sandy, MD;   Location: [place];  Service: Vascular;  Laterality: Left;  . BASCILIC VEIN TRANSPOSITION Left 02/11/2019   Procedure: BASILIC VEIN TRANSPOSITION SECOND STAGE;  Surgeon: Waynetta Sandy, MD;  Location: [place];  Service: Vascular;  Laterality: Left;  . CARDIAC CATHETERIZATION N/A 06/01/2015   Procedure: Left Heart Cath and Coronary Angiography;  Surgeon: Belva Crome, MD; CFX 30%, no other dz, EF nl by echo  . CESAREAN SECTION     x2  . DILATION AND CURETTAGE OF UTERUS    . EYE SURGERY     Right eye pars plano vitrectomy   . LASIK    . PARS PLANA VITRECTOMY  04/20/2011   Procedure: PARS PLANA VITRECTOMY WITH 25 GAUGE;  Surgeon: Hayden Pedro, MD;  Location: [place];  Service: Ophthalmology;  Laterality: Left;  membrane peel, gas fluid exchange, endolaser, repair of complex retinal detachment  . TUBAL LIGATION       OB History    Gravida  2   Para  2   Term  2   Preterm      AB      Living  2     SAB      TAB      Ectopic      Multiple      Live Births               Home Medications    Prior to Admission medications   Medication Sig Start Date End Date Taking? Authorizing Provider  albuterol (PROVENTIL HFA;VENTOLIN HFA) 108 (90 BASE) MCG/ACT inhaler Inhale 1-2 puffs into the lungs every 6 (six) hours as needed for wheezing or shortness of breath. Patient taking differently: Inhale 1-2 puffs into the lungs every 6 (six) hours as needed for shortness of breath.  06/20/14   Nat Christen, MD  amLODipine (NORVASC) 10 MG tablet Take 1 tablet (10 mg total) by mouth daily. 01/21/19   Orson Eva, MD  atorvastatin (LIPITOR) 80 MG tablet Take 1 tablet (80 mg total) by mouth daily at 6 PM. Patient not taking: Reported on 01/30/2019 08/05/15   Satira Sark, MD  cloNIDine (CATAPRES) 0.2 MG tablet Take 0.2 mg by mouth 2 (two) times daily.     [provider]  clopidogrel (PLAVIX) 75 MG tablet Take 1 tablet (75 mg total) by mouth daily. 05/24/15   Hongalgi, Lenis Dickinson, MD  dorzolamide (TRUSOPT) 2 % ophthalmic solution Place 1 drop into both eyes 2 (two) times daily as needed (eye pain.).     [provider]  FLUoxetine (PROZAC) 20 MG capsule Take 40 mg by mouth daily.     [provider]  furosemide (LASIX) 40 MG tablet Take 40 mg by mouth daily.     [provider]  gabapentin (NEURONTIN) 300 MG capsule Take 300 mg by mouth at bedtime.  02/07/18   [provider]  hydrALAZINE (APRESOLINE) 25 MG tablet Take 1 tablet (25 mg total) by mouth 2 (two) times daily. 01/21/19   Orson Eva, MD  HYDROcodone-acetaminophen (NORCO/VICODIN) 5-325 MG tablet Take 1 tablet by mouth every 6 (six) hours as needed for moderate pain. 02/11/19   Rhyne, Hulen Shouts, PA-C  insulin aspart (NOVOLOG) 100 UNIT/ML FlexPen Inject 10 Units into the skin 3 (three) times daily before meals.     [provider]  Insulin Detemir (LEVEMIR) 100 UNIT/ML Pen Inject 50 Units into the skin at bedtime. Patient taking differently: Inject 48-80 Units into the skin at bedtime. Prescribed 80 units at bedtime. Patient takes half dose if small meal is eaten at supper 07/30/18   Cassandria Anger, MD  metoprolol succinate (TOPROL-XL) 50 MG 24 hr tablet Take 50 mg by mouth daily. 10/11/18   [provider]  omeprazole (PRILOSEC) 20 MG capsule Take 20 mg by mouth daily before breakfast.  02/07/18   [provider]  sodium bicarbonate 650 MG tablet Take 650 mg by mouth 2 (two) times daily.  11/19/18   [provider]  SYMBICORT 160-4.5 MCG/ACT inhaler Inhale 2 puffs into the lungs 2 (two) times daily.  02/07/18   [provider]  tiotropium (SPIRIVA) 18 MCG inhalation capsule Place 18 mcg into inhaler and inhale 3 (three) times a week.     [provider]  traZODone (DESYREL) 100 MG tablet Take 100 mg by mouth at bedtime.    [provider]    Family History Family History  Problem Relation Age of Onset  . Diabetes  Mother   . Kidney failure Mother   . Diabetes Father   . Amblyopia Neg Hx   . Blindness Neg Hx   . Cataracts Neg Hx   . Glaucoma Neg Hx   . Macular degeneration Neg Hx   . Retinal detachment Neg Hx   . Strabismus Neg Hx   . Retinitis pigmentosa Neg Hx     Social History Social History   Tobacco Use  . Smoking status: Former Smoker    Packs/day: 0.03    Years: 30.00    Pack years: 0.90    Types: Cigarettes    Start date: 06/05/1981    Quit date: 12/04/2018    Years since quitting: 0.2  . Smokeless tobacco: Never Used  Substance Use Topics  . Alcohol use: Yes    Alcohol/week: 0.0 standard drinks    Comment: occ wine  . Drug use: No     Allergies   Contrast media [iodinated diagnostic agents] and Penicillins   Review of Systems Review of Systems  Constitutional: Negative for fever.  Skin: Positive for wound.  All other systems reviewed and are negative.    Physical Exam Updated Vital Signs BP (!) 133/53   Pulse 74   Temp 99.1 F (37.3 C) (Oral)   Resp 18   Ht 5\' 4"  (1.626 m)   Wt 117 kg   LMP 02/12/2019   SpO2 99%   BMI 44.29 kg/m   Physical Exam Vitals signs and nursing note reviewed.  Constitutional:      General: She is not in acute distress.    Appearance: Normal appearance. She is well-developed.  HENT:     Head: Normocephalic and atraumatic.     Right Ear: Hearing normal.     Left Ear: Hearing normal.     Nose: Nose normal.  Eyes:     Conjunctiva/sclera: Conjunctivae normal.     Pupils: Pupils  are equal, round, and reactive to light.  Neck:     Musculoskeletal: Normal range of motion and neck supple.  Cardiovascular:     Rate and Rhythm: Regular rhythm.     Heart sounds: S1 normal and S2 normal. No murmur. No friction rub. No gallop.   Pulmonary:     Effort: Pulmonary effort is normal. No respiratory distress.     Breath sounds: Normal breath sounds.  Chest:     Chest wall: No tenderness.  Abdominal:     General: Bowel sounds are  normal.     Palpations: Abdomen is soft.     Tenderness: There is no abdominal tenderness. There is no guarding or rebound. Negative signs include Murphy's sign and McBurney's sign.     Hernia: No hernia is present.  Musculoskeletal: Normal range of motion.  Skin:    General: Skin is warm and dry.     Findings: No rash.     Comments: 4 cm x 1.5 cm ulcerated area over surgical incision left anterior bicep with surrounding fullness and induration without fluctuance  Neurological:     Mental Status: She is alert and oriented to person, place, and time.     GCS: GCS eye subscore is 4. GCS verbal subscore is 5. GCS motor subscore is 6.     Cranial Nerves: No cranial nerve deficit.     Sensory: No sensory deficit.     Coordination: Coordination normal.  Psychiatric:        Speech: Speech normal.        Behavior: Behavior normal.        Thought Content: Thought content normal.      ED Treatments / Results  Labs (all labs ordered are listed, but only abnormal results are displayed) Labs Reviewed  CBC WITH DIFFERENTIAL/PLATELET - Abnormal; Notable for the following components:      Result Value   WBC 13.1 (*)    RBC 3.24 (*)    Hemoglobin 8.4 (*)    HCT 27.9 (*)    MCH 25.9 (*)    Neutro Abs 10.3 (*)    Monocytes Absolute 1.8 (*)    Abs Immature Granulocytes 0.08 (*)    All other components within normal limits  BASIC METABOLIC PANEL - Abnormal; Notable for the following components:   Sodium 126 (*)    Chloride 97 (*)    CO2 19 (*)    Glucose, Bld 336 (*)    BUN 46 (*)    Creatinine, Ser 4.06 (*)    Calcium 7.9 (*)    GFR calc non Af Amer 12 (*)    GFR calc Af Amer 14 (*)    All other components within normal limits  SARS CORONAVIRUS 2 (HOSPITAL ORDER, [place])    EKG None  Radiology No results found.  Procedures Procedures (including critical care time)  Medications Ordered in ED Medications - No data to display   Initial  Impression / Assessment and Plan / ED Course  I have reviewed the triage vital signs and the nursing notes.  Pertinent labs & imaging results that were available during my care of the patient were reviewed by me and considered in my medical decision making (see chart for details).        Patient presents to the ER for evaluation of bleeding from previous surgical site.  Patient had dialysis fistula placed on September 8.  She has had slight dehiscence of the surgical site  with intermittent bleeding since surgery.  Tonight she had a large amount of bleeding.  EMS reports that she had soaked her shirt and pants prior to their arrival.  They placed multiple ABD pads and a pressure dressing and she soaked through those during transport.  Area was redressed at arrival and monitored, she has achieved hemostasis.  Patient reports that there is foul smell coming from the surgical site but I do not see any purulence on exam.  There is induration and a significant amount of fullness around the surgical site, likely a retained hematoma.  Cannot, however, rule out vascular bleeding, though I doubt it.  Discussed with Dr. Donnetta Hutching, on-call for vascular surgery.  Patient will be transferred to [facility] for Dr. Donnetta Hutching to evaluate further.  Patient has had a 4 g drop in her hemoglobin since the surgery but is asymptomatic at this time.  Final Clinical Impressions(s) / ED Diagnoses   Final diagnoses:  Disruption of external surgical wound, subsequent encounter    ED Discharge Orders    None       Marri Mcneff, Gwenyth Allegra, MD 03/03/19 [phone]

## 2019-03-03 ENCOUNTER — Encounter (HOSPITAL_COMMUNITY)
Admission: EM | Disposition: A | Discharge status: Home Health | Payer: Self-pay | Source: Home / Self Care | Attending: Vascular Surgery

## 2019-03-03 ENCOUNTER — Encounter (HOSPITAL_COMMUNITY): Payer: Self-pay | Admitting: Certified Registered Nurse Anesthetist

## 2019-03-03 ENCOUNTER — Emergency Department (HOSPITAL_COMMUNITY): Payer: Medicare HMO | Admitting: Certified Registered Nurse Anesthetist

## 2019-03-03 DIAGNOSIS — Z20828 Contact with and (suspected) exposure to other viral communicable diseases: Secondary | ICD-10-CM | POA: Diagnosis present

## 2019-03-03 DIAGNOSIS — M1909 Primary osteoarthritis, other specified site: Secondary | ICD-10-CM | POA: Diagnosis present

## 2019-03-03 DIAGNOSIS — Z833 Family history of diabetes mellitus: Secondary | ICD-10-CM | POA: Diagnosis not present

## 2019-03-03 DIAGNOSIS — K219 Gastro-esophageal reflux disease without esophagitis: Secondary | ICD-10-CM | POA: Diagnosis present

## 2019-03-03 DIAGNOSIS — N184 Chronic kidney disease, stage 4 (severe): Secondary | ICD-10-CM | POA: Diagnosis present

## 2019-03-03 DIAGNOSIS — Z880 Allergy status to penicillin: Secondary | ICD-10-CM | POA: Diagnosis not present

## 2019-03-03 DIAGNOSIS — Z87891 Personal history of nicotine dependence: Secondary | ICD-10-CM | POA: Diagnosis not present

## 2019-03-03 DIAGNOSIS — E785 Hyperlipidemia, unspecified: Secondary | ICD-10-CM | POA: Diagnosis present

## 2019-03-03 DIAGNOSIS — F4310 Post-traumatic stress disorder, unspecified: Secondary | ICD-10-CM | POA: Diagnosis present

## 2019-03-03 DIAGNOSIS — S40022A Contusion of left upper arm, initial encounter: Secondary | ICD-10-CM | POA: Diagnosis present

## 2019-03-03 DIAGNOSIS — I97638 Postprocedural hematoma of a circulatory system organ or structure following other circulatory system procedure: Secondary | ICD-10-CM

## 2019-03-03 DIAGNOSIS — E1122 Type 2 diabetes mellitus with diabetic chronic kidney disease: Secondary | ICD-10-CM | POA: Diagnosis present

## 2019-03-03 DIAGNOSIS — T8131XA Disruption of external operation (surgical) wound, not elsewhere classified, initial encounter: Secondary | ICD-10-CM | POA: Diagnosis present

## 2019-03-03 DIAGNOSIS — Z8673 Personal history of transient ischemic attack (TIA), and cerebral infarction without residual deficits: Secondary | ICD-10-CM | POA: Diagnosis not present

## 2019-03-03 DIAGNOSIS — T8131XD Disruption of external operation (surgical) wound, not elsewhere classified, subsequent encounter: Secondary | ICD-10-CM | POA: Diagnosis present

## 2019-03-03 DIAGNOSIS — Z841 Family history of disorders of kidney and ureter: Secondary | ICD-10-CM | POA: Diagnosis not present

## 2019-03-03 DIAGNOSIS — Z794 Long term (current) use of insulin: Secondary | ICD-10-CM | POA: Diagnosis not present

## 2019-03-03 DIAGNOSIS — F419 Anxiety disorder, unspecified: Secondary | ICD-10-CM | POA: Diagnosis present

## 2019-03-03 DIAGNOSIS — F319 Bipolar disorder, unspecified: Secondary | ICD-10-CM | POA: Diagnosis present

## 2019-03-03 DIAGNOSIS — Y838 Other surgical procedures as the cause of abnormal reaction of the patient, or of later complication, without mention of misadventure at the time of the procedure: Secondary | ICD-10-CM | POA: Diagnosis present

## 2019-03-03 DIAGNOSIS — J449 Chronic obstructive pulmonary disease, unspecified: Secondary | ICD-10-CM | POA: Diagnosis present

## 2019-03-03 DIAGNOSIS — Z91041 Radiographic dye allergy status: Secondary | ICD-10-CM | POA: Diagnosis not present

## 2019-03-03 DIAGNOSIS — Z7902 Long term (current) use of antithrombotics/antiplatelets: Secondary | ICD-10-CM | POA: Diagnosis not present

## 2019-03-03 DIAGNOSIS — Z6841 Body Mass Index (BMI) 40.0 and over, adult: Secondary | ICD-10-CM | POA: Diagnosis not present

## 2019-03-03 DIAGNOSIS — I129 Hypertensive chronic kidney disease with stage 1 through stage 4 chronic kidney disease, or unspecified chronic kidney disease: Secondary | ICD-10-CM | POA: Diagnosis present

## 2019-03-03 DIAGNOSIS — E11319 Type 2 diabetes mellitus with unspecified diabetic retinopathy without macular edema: Secondary | ICD-10-CM | POA: Diagnosis present

## 2019-03-03 HISTORY — PX: HEMATOMA EVACUATION: SHX5118

## 2019-03-03 LAB — POCT I-STAT, CHEM 8
BUN: 42 mg/dL — ABNORMAL HIGH (ref 6–20)
Calcium, Ion: 1.18 mmol/L (ref 1.15–1.40)
Chloride: 98 mmol/L (ref 98–111)
Creatinine, Ser: 4.4 mg/dL — ABNORMAL HIGH (ref 0.44–1.00)
Glucose, Bld: 122 mg/dL — ABNORMAL HIGH (ref 70–99)
HCT: 28 % — ABNORMAL LOW (ref 36.0–46.0)
Hemoglobin: 9.5 g/dL — ABNORMAL LOW (ref 12.0–15.0)
Potassium: 4.2 mmol/L (ref 3.5–5.1)
Sodium: 132 mmol/L — ABNORMAL LOW (ref 135–145)
TCO2: 22 mmol/L (ref 22–32)

## 2019-03-03 LAB — CBG MONITORING, ED: Glucose-Capillary: 134 mg/dL — ABNORMAL HIGH (ref 70–99)

## 2019-03-03 LAB — GLUCOSE, CAPILLARY: Glucose-Capillary: 123 mg/dL — ABNORMAL HIGH (ref 70–99)

## 2019-03-03 LAB — SARS CORONAVIRUS 2 BY RT PCR (HOSPITAL ORDER, PERFORMED IN ~~LOC~~ HOSPITAL LAB): SARS Coronavirus 2: NEGATIVE

## 2019-03-03 LAB — HCG, SERUM, QUALITATIVE: Preg, Serum: NEGATIVE

## 2019-03-03 SURGERY — EVACUATION HEMATOMA
Anesthesia: General | Site: Arm Upper | Laterality: Left

## 2019-03-03 MED ORDER — OXYCODONE HCL 5 MG/5ML PO SOLN: 5.0000 mg | Freq: Once | ORAL | Status: DC | PRN

## 2019-03-03 MED ORDER — FENTANYL CITRATE (PF) 100 MCG/2ML IJ SOLN: 25.0000 ug | INTRAMUSCULAR | Status: DC | PRN

## 2019-03-03 MED ORDER — PROPOFOL 10 MG/ML IV BOLUS
INTRAVENOUS | Status: AC
  Filled 2019-03-03: qty 20

## 2019-03-03 MED ORDER — ACETAMINOPHEN 325 MG RE SUPP
325.0000 mg | RECTAL | Status: DC | PRN
  Filled 2019-03-03: qty 2

## 2019-03-03 MED ORDER — LIDOCAINE 2% (20 MG/ML) 5 ML SYRINGE
INTRAMUSCULAR | Status: DC | PRN
  Administered 2019-03-03: 100 mg via INTRAVENOUS

## 2019-03-03 MED ORDER — PROPOFOL 10 MG/ML IV BOLUS
INTRAVENOUS | Status: DC | PRN
  Administered 2019-03-03: 200 mg via INTRAVENOUS

## 2019-03-03 MED ORDER — EPHEDRINE SULFATE-NACL 50-0.9 MG/10ML-% IV SOSY
PREFILLED_SYRINGE | INTRAVENOUS | Status: DC | PRN
  Administered 2019-03-03: 15 mg via INTRAVENOUS

## 2019-03-03 MED ORDER — SODIUM CHLORIDE 0.9 % IV SOLN
INTRAVENOUS | Status: DC
  Administered 2019-03-03: 11:00:00 via INTRAVENOUS

## 2019-03-03 MED ORDER — HYDRALAZINE HCL 20 MG/ML IJ SOLN
5.0000 mg | INTRAMUSCULAR | Status: DC | PRN
  Filled 2019-03-03: qty 1

## 2019-03-03 MED ORDER — PANTOPRAZOLE SODIUM 40 MG PO TBEC
40.0000 mg | DELAYED_RELEASE_TABLET | Freq: Every day | ORAL | Status: DC
  Administered 2019-03-03 – 2019-03-06 (×4): 40 mg via ORAL
  Filled 2019-03-03 (×4): qty 1

## 2019-03-03 MED ORDER — POTASSIUM CHLORIDE CRYS ER 20 MEQ PO TBCR: 20.0000 meq | EXTENDED_RELEASE_TABLET | Freq: Once | ORAL | Status: DC

## 2019-03-03 MED ORDER — ACETAMINOPHEN 325 MG PO TABS: 325.0000 mg | ORAL_TABLET | ORAL | Status: DC | PRN

## 2019-03-03 MED ORDER — OXYCODONE HCL 5 MG PO TABS: 5.0000 mg | ORAL_TABLET | Freq: Once | ORAL | Status: DC | PRN

## 2019-03-03 MED ORDER — ONDANSETRON HCL 4 MG/2ML IJ SOLN
INTRAMUSCULAR | Status: DC | PRN
  Administered 2019-03-03: 4 mg via INTRAVENOUS

## 2019-03-03 MED ORDER — FENTANYL CITRATE (PF) 100 MCG/2ML IJ SOLN
50.0000 ug | Freq: Once | INTRAMUSCULAR | Status: AC
  Administered 2019-03-03: 50 ug via INTRAVENOUS
  Filled 2019-03-03: qty 2

## 2019-03-03 MED ORDER — FENTANYL CITRATE (PF) 250 MCG/5ML IJ SOLN
INTRAMUSCULAR | Status: AC
  Filled 2019-03-03: qty 5

## 2019-03-03 MED ORDER — SODIUM CHLORIDE 0.9 % IR SOLN
Status: DC | PRN
  Administered 2019-03-03: 2000 mL

## 2019-03-03 MED ORDER — METOPROLOL SUCCINATE ER 25 MG PO TB24
ORAL_TABLET | ORAL | Status: AC
  Administered 2019-03-03: 50 mg via ORAL
  Filled 2019-03-03: qty 2

## 2019-03-03 MED ORDER — OXYCODONE HCL 5 MG PO TABS
ORAL_TABLET | ORAL | Status: AC
  Administered 2019-03-03: 5 mg
  Filled 2019-03-03: qty 2

## 2019-03-03 MED ORDER — VANCOMYCIN HCL 10 G IV SOLR
1500.0000 mg | Freq: Once | INTRAVENOUS | Status: AC
  Administered 2019-03-03: 1500 mg via INTRAVENOUS
  Filled 2019-03-03: qty 1500

## 2019-03-03 MED ORDER — SODIUM CHLORIDE 0.9 % IV SOLN: 250.0000 mL | INTRAVENOUS | Status: DC | PRN

## 2019-03-03 MED ORDER — FENTANYL CITRATE (PF) 250 MCG/5ML IJ SOLN
INTRAMUSCULAR | Status: DC | PRN
  Administered 2019-03-03 (×2): 25 ug via INTRAVENOUS
  Administered 2019-03-03 (×4): 50 ug via INTRAVENOUS

## 2019-03-03 MED ORDER — PHENOL 1.4 % MT LIQD: 1.0000 | OROMUCOSAL | Status: DC | PRN

## 2019-03-03 MED ORDER — MIDAZOLAM HCL 2 MG/2ML IJ SOLN
INTRAMUSCULAR | Status: DC | PRN
  Administered 2019-03-03: 2 mg via INTRAVENOUS

## 2019-03-03 MED ORDER — VANCOMYCIN HCL 10 G IV SOLR
1250.0000 mg | Freq: Once | INTRAVENOUS | Status: AC
  Administered 2019-03-04: 1250 mg via INTRAVENOUS
  Filled 2019-03-03 (×2): qty 1250

## 2019-03-03 MED ORDER — OXYCODONE HCL 5 MG PO TABS
5.0000 mg | ORAL_TABLET | ORAL | Status: DC | PRN
  Administered 2019-03-03: 10 mg via ORAL
  Administered 2019-03-04 (×2): 5 mg via ORAL
  Administered 2019-03-04 – 2019-03-05 (×5): 10 mg via ORAL
  Filled 2019-03-03: qty 2
  Filled 2019-03-03: qty 1
  Filled 2019-03-03 (×2): qty 2
  Filled 2019-03-03 (×2): qty 1
  Filled 2019-03-03 (×2): qty 2

## 2019-03-03 MED ORDER — METOPROLOL TARTRATE 5 MG/5ML IV SOLN: 2.0000 mg | INTRAVENOUS | Status: DC | PRN

## 2019-03-03 MED ORDER — PROMETHAZINE HCL 25 MG/ML IJ SOLN: 6.2500 mg | INTRAMUSCULAR | Status: DC | PRN

## 2019-03-03 MED ORDER — METOPROLOL SUCCINATE ER 25 MG PO TB24
50.0000 mg | ORAL_TABLET | Freq: Every day | ORAL | Status: DC
  Administered 2019-03-03: 11:00:00 50 mg via ORAL

## 2019-03-03 MED ORDER — LABETALOL HCL 5 MG/ML IV SOLN
10.0000 mg | INTRAVENOUS | Status: DC | PRN
  Administered 2019-03-05 – 2019-03-06 (×3): 10 mg via INTRAVENOUS
  Filled 2019-03-03 (×3): qty 4

## 2019-03-03 MED ORDER — SODIUM CHLORIDE 0.9% FLUSH: 3.0000 mL | INTRAVENOUS | Status: DC | PRN

## 2019-03-03 MED ORDER — ONDANSETRON HCL 4 MG/2ML IJ SOLN: 4.0000 mg | Freq: Four times a day (QID) | INTRAMUSCULAR | Status: DC | PRN

## 2019-03-03 MED ORDER — MIDAZOLAM HCL 2 MG/2ML IJ SOLN
INTRAMUSCULAR | Status: AC
  Filled 2019-03-03: qty 2

## 2019-03-03 MED ORDER — GUAIFENESIN-DM 100-10 MG/5ML PO SYRP: 15.0000 mL | ORAL_SOLUTION | ORAL | Status: DC | PRN

## 2019-03-03 SURGICAL SUPPLY — 52 items
BANDAGE ESMARK 6X9 LF (GAUZE/BANDAGES/DRESSINGS) IMPLANT
BNDG ELASTIC 3X5.8 VLCR STR LF (GAUZE/BANDAGES/DRESSINGS) ×2 IMPLANT
BNDG ELASTIC 6X5.8 VLCR STR LF (GAUZE/BANDAGES/DRESSINGS) ×2 IMPLANT
BNDG ESMARK 6X9 LF (GAUZE/BANDAGES/DRESSINGS)
BNDG GAUZE ELAST 4 BULKY (GAUZE/BANDAGES/DRESSINGS) ×2 IMPLANT
CANISTER SUCT 3000ML PPV (MISCELLANEOUS) ×2 IMPLANT
COVER WAND RF STERILE (DRAPES) ×2 IMPLANT
CUFF TOURN SGL QUICK 18X4 (TOURNIQUET CUFF) IMPLANT
CUFF TOURN SGL QUICK 24 (TOURNIQUET CUFF)
CUFF TOURN SGL QUICK 34 (TOURNIQUET CUFF)
CUFF TOURN SGL QUICK 42 (TOURNIQUET CUFF) IMPLANT
CUFF TRNQT CYL 24X4X16.5-23 (TOURNIQUET CUFF) IMPLANT
CUFF TRNQT CYL 34X4.125X (TOURNIQUET CUFF) IMPLANT
DERMABOND ADVANCED (GAUZE/BANDAGES/DRESSINGS)
DERMABOND ADVANCED .7 DNX12 (GAUZE/BANDAGES/DRESSINGS) IMPLANT
DRAIN JACKSON PRT FLT 10 (DRAIN) ×2 IMPLANT
DRAIN SNY 10X20 3/4 PERF (WOUND CARE) IMPLANT
DRAPE ORTHO SPLIT 77X108 STRL (DRAPES)
DRAPE SURG ORHT 6 SPLT 77X108 (DRAPES) IMPLANT
ELECT REM PT RETURN 9FT ADLT (ELECTROSURGICAL) ×2
ELECTRODE REM PT RTRN 9FT ADLT (ELECTROSURGICAL) ×1 IMPLANT
EVACUATOR SILICONE 100CC (DRAIN) ×2 IMPLANT
GAUZE SPONGE 4X4 16PLY XRAY LF (GAUZE/BANDAGES/DRESSINGS) ×2 IMPLANT
GLOVE BIO SURGEON STRL SZ7.5 (GLOVE) ×2 IMPLANT
GLOVE BIOGEL PI IND STRL 6.5 (GLOVE) ×2 IMPLANT
GLOVE BIOGEL PI INDICATOR 6.5 (GLOVE) ×2
GLOVE SURG SS PI 6.0 STRL IVOR (GLOVE) ×2 IMPLANT
GOWN STRL REUS W/ TWL LRG LVL3 (GOWN DISPOSABLE) ×3 IMPLANT
GOWN STRL REUS W/TWL LRG LVL3 (GOWN DISPOSABLE) ×3
KIT BASIN OR (CUSTOM PROCEDURE TRAY) ×2 IMPLANT
KIT TURNOVER KIT B (KITS) ×2 IMPLANT
NS IRRIG 1000ML POUR BTL (IV SOLUTION) ×4 IMPLANT
PACK CV ACCESS (CUSTOM PROCEDURE TRAY) IMPLANT
PACK GENERAL/GYN (CUSTOM PROCEDURE TRAY) IMPLANT
PACK PERIPHERAL VASCULAR (CUSTOM PROCEDURE TRAY) IMPLANT
PACK UNIVERSAL I (CUSTOM PROCEDURE TRAY) IMPLANT
PAD ARMBOARD 7.5X6 YLW CONV (MISCELLANEOUS) ×4 IMPLANT
SPONGE SURGIFOAM ABS GEL 100 (HEMOSTASIS) IMPLANT
STAPLER VISISTAT 35W (STAPLE) IMPLANT
STOCKINETTE 6  STRL (DRAPES) ×1
STOCKINETTE 6 STRL (DRAPES) ×1 IMPLANT
SUT ETHILON 3 0 PS 1 (SUTURE) ×8 IMPLANT
SUT PROLENE 5 0 C 1 24 (SUTURE) IMPLANT
SUT PROLENE 6 0 CC (SUTURE) IMPLANT
SUT VIC AB 2-0 CTX 36 (SUTURE) IMPLANT
SUT VIC AB 2-0 SH 27 (SUTURE) ×1
SUT VIC AB 2-0 SH 27X BRD (SUTURE) ×1 IMPLANT
SUT VIC AB 3-0 SH 27 (SUTURE) ×1
SUT VIC AB 3-0 SH 27X BRD (SUTURE) ×1 IMPLANT
TOWEL GREEN STERILE (TOWEL DISPOSABLE) ×2 IMPLANT
UNDERPAD 30X30 (UNDERPADS AND DIAPERS) ×2 IMPLANT
WATER STERILE IRR 1000ML POUR (IV SOLUTION) ×2 IMPLANT

## 2019-03-03 NOTE — Transfer of Care (Signed)
Immediate Anesthesia Transfer of Care Note  Patient: Samantha Pierce  Procedure(s) Performed: EVACUATION HEMATOMA (Left Arm Upper)  Patient Location: PACU  Anesthesia Type:General  Level of Consciousness: drowsy and patient cooperative  Airway & Oxygen Therapy: Patient Spontanous Breathing  Post-op Assessment: Report given to RN and Post -op Vital signs reviewed and stable  Post vital signs: Reviewed and stable  Last Vitals:  Vitals Value Taken Time  BP    Temp    Pulse    Resp    SpO2      Last Pain:  Vitals:   03/03/19 0957  TempSrc:   PainSc: 8          Complications: No apparent anesthesia complications

## 2019-03-03 NOTE — ED Notes (Signed)
Report given to [facility] ED charge RN.

## 2019-03-03 NOTE — ED Provider Notes (Signed)
Discussed with Dr. Betsey Holiday, patient has a new fistula in left arm from about 8 days ago.  She has significant swelling with 4 g hemoglobin drop.  Discussed with Dr. early and request transfer to [facility] for consult and disposition.   Ivoree Felmlee, Corene Cornea, MD 03/03/19 [phone]

## 2019-03-03 NOTE — ED Notes (Signed)
[facility] here to transport patient. 

## 2019-03-03 NOTE — Progress Notes (Signed)
Dr. Oneida Alar made aware that patient does not have anyone to stay with patient overnight.

## 2019-03-03 NOTE — Consult Note (Signed)
Patient name: Samantha Pierce MRN: [id number] DOB: December 14, 1970 Sex: female    HPI: TRANG HELL is a 48 y.o. female seen in the emergency room for ongoing bleeding from left upper arm.  She has had a very complicated course.  She has stage IV renal insufficiency and is not on hemodialysis.  On 11/20/2018 she underwent for stage basilic vein fistula with Dr. Donzetta Matters.  She was returned to the operating room on 02/11/2019 for second stage basilic vein transposition fistula.  She presented to [facility], ER the night of this procedure having sensation of a popping sensation in her arm and noted some bleeding.  She had Ace wrap placed and was seen in our office by our physician assistant on 02/12/2019.  She had no ongoing bleeding and was instructed on Ace wrap and elevation.  She presented to the [facility] emergency room on 02/22/2019 with ongoing oozing from her incision.  There was no active bleeding at that time and she again was instructed to elevate her arm and had a Ace wrap placed.  Most recently she presented to our office on 02/26/2019 with continued oozing again felt to be no active bleeding with potential old hematoma and was redressed.  She presented to the emergency room at [facility] on the evening of 03/02/2019 with persistent bleeding from her arm.  She reports that this is been not active arterial red bleeding but continues to have oozing of what sounds like old blood from her arm.  She was taken to [facility] by EMS.  I was consulted and asked to have her transferred to [facility] for further evaluation.  She has no steal symptoms.  Is not on hemodialysis currently.  No current facility-administered medications for this encounter.    Current Outpatient Medications  Medication Sig Dispense Refill  . albuterol (PROVENTIL HFA;VENTOLIN HFA) 108 (90 BASE) MCG/ACT inhaler Inhale 1-2 puffs into the lungs every 6 (six) hours as needed for wheezing or shortness  of breath. (Patient taking differently: Inhale 1-2 puffs into the lungs every 6 (six) hours as needed for shortness of breath. ) 1 Inhaler 0  . amLODipine (NORVASC) 10 MG tablet Take 1 tablet (10 mg total) by mouth daily. 30 tablet 1  . atorvastatin (LIPITOR) 80 MG tablet Take 1 tablet (80 mg total) by mouth daily at 6 PM. (Patient not taking: Reported on 01/30/2019) 30 tablet 6  . cloNIDine (CATAPRES) 0.2 MG tablet Take 0.2 mg by mouth 2 (two) times daily.     . clopidogrel (PLAVIX) 75 MG tablet Take 1 tablet (75 mg total) by mouth daily. 30 tablet 0  . dorzolamide (TRUSOPT) 2 % ophthalmic solution Place 1 drop into both eyes 2 (two) times daily as needed (eye pain.).     Marland Kitchen FLUoxetine (PROZAC) 20 MG capsule Take 40 mg by mouth daily.     . furosemide (LASIX) 40 MG tablet Take 40 mg by mouth daily.     Marland Kitchen gabapentin (NEURONTIN) 300 MG capsule Take 300 mg by mouth at bedtime.     . hydrALAZINE (APRESOLINE) 25 MG tablet Take 1 tablet (25 mg total) by mouth 2 (two) times daily. 60 tablet 1  . HYDROcodone-acetaminophen (NORCO/VICODIN) 5-325 MG tablet Take 1 tablet by mouth every 6 (six) hours as needed for moderate pain. 20 tablet 0  . insulin aspart (NOVOLOG) 100 UNIT/ML FlexPen Inject 10 Units into the skin 3 (three) times daily before meals.     . Insulin Detemir (  LEVEMIR) 100 UNIT/ML Pen Inject 50 Units into the skin at bedtime. (Patient taking differently: Inject 48-80 Units into the skin at bedtime. Prescribed 80 units at bedtime. Patient takes half dose if small meal is eaten at supper) 15 mL   . metoprolol succinate (TOPROL-XL) 50 MG 24 hr tablet Take 50 mg by mouth daily.    Marland Kitchen omeprazole (PRILOSEC) 20 MG capsule Take 20 mg by mouth daily before breakfast.     . sodium bicarbonate 650 MG tablet Take 650 mg by mouth 2 (two) times daily.     . SYMBICORT 160-4.5 MCG/ACT inhaler Inhale 2 puffs into the lungs 2 (two) times daily.     Marland Kitchen tiotropium (SPIRIVA) 18 MCG inhalation capsule Place 18 mcg into  inhaler and inhale 3 (three) times a week.     . traZODone (DESYREL) 100 MG tablet Take 100 mg by mouth at bedtime.       PHYSICAL EXAM: Vitals:   03/02/19 2141 03/02/19 2200 03/03/19 0038 03/03/19 0217  BP: (!) 103/56 (!) 145/70 (!) 133/53 (!) 125/54  Pulse: 79 76 74 71  Resp: 18   18  Temp:    98.4 F (36.9 C)  TempSrc:    Oral  SpO2: 100% 97% 99% 99%  Weight:      Height:        GENERAL: The patient is a well-nourished female, in no acute distress. The vital signs are documented above. She has a large hematoma in her upper arm harvest site.  She does have a thrill present.  Due to the persistent hematoma she does have some thickening in her skin.  There is no evidence of infection.  She does have separation of her skin in the vein harvest incision with old blood present here as well  Potassium is 4.2.  Hemoglobin and hematocrit are 8.4 and 27.9.  Coronavirus test is negative  MEDICAL ISSUES: Large hematoma in her tunnel track from basilic vein harvest for basilic vein fistula.  She has presented to the emergency room on 3 occasions in the last 3 weeks since the surgery with continued persistent oozing from this.  She clearly is failed conservative treatment.  I have recommended that she be returned to the operating room for evacuation of hematoma and washout of her wound.  Fortunately this does appear to be remote from her fistula itself.  She will be made n.p.o. in the emergency room.  She will not require admission and will be taken to the operating room later today   Rosetta Posner, MD [facility] Tel [phone] Pager [phone]

## 2019-03-03 NOTE — ED Provider Notes (Signed)
   1:54 AM Patient arrives from [facility] for evaluation with vascular surgery.  Recent fistula placed on 02/11/19 and has had wound dehiscence and drop in hemoglobin.  Initially bleeding was controlled but states it started bleeding again en route through the bandage and was re-wrapped just PTA.  Patient is HD stable at this time.  Will page Dr. Donnetta Hutching.  Dr. Donnetta Hutching has evaluated-- will take to OR today but will not require formal admission.  She will be discharged after surgery.  Patient to remain NPO.   Larene Pickett, PA-C 03/03/19 0341    Merrily Pew, MD 03/03/19 [phone]

## 2019-03-03 NOTE — Anesthesia Postprocedure Evaluation (Signed)
Anesthesia Post Note  Patient: Samantha Pierce  Procedure(s) Performed: EVACUATION HEMATOMA (Left Arm Upper)     Patient location during evaluation: PACU Anesthesia Type: General Level of consciousness: awake and alert Pain management: pain level controlled Vital Signs Assessment: post-procedure vital signs reviewed and stable Respiratory status: spontaneous breathing, nonlabored ventilation and respiratory function stable Cardiovascular status: blood pressure returned to baseline and stable Postop Assessment: no apparent nausea or vomiting Anesthetic complications: no    Last Vitals:  Vitals:   03/03/19 1305 03/03/19 1315  BP: (!) 111/58   Pulse: 83   Resp: 20 19  Temp:    SpO2: 94%     Last Pain:  Vitals:   03/03/19 1300  TempSrc:   PainSc: 0-No pain                 Audry Pili

## 2019-03-03 NOTE — Anesthesia Preprocedure Evaluation (Addendum)
Anesthesia Evaluation  Patient identified by MRN, date of birth, ID band Patient awake    Reviewed: Allergy & Precautions, NPO status , Patient's Chart, lab work & pertinent test results  Airway Mallampati: III  TM Distance: >3 FB Neck ROM: Full    Dental  (+) Missing   Pulmonary COPD,  COPD inhaler, former smoker,    Pulmonary exam normal        Cardiovascular hypertension, Pt. on medications and Pt. on home beta blockers + CAD and +CHF  Normal cardiovascular exam     Neuro/Psych neg Seizures (denies) PSYCHIATRIC DISORDERS Anxiety Depression Bipolar Disorder TIACVA, No Residual Symptoms    GI/Hepatic Neg liver ROS, GERD  ,  Endo/Other  diabetes, Type 2, Insulin DependentMorbid obesity  Renal/GU CRFRenal disease     Musculoskeletal  (+) Arthritis ,   Abdominal (+) + obese,   Peds  Hematology  (+) anemia ,   Anesthesia Other Findings   Reproductive/Obstetrics  S/p tubal ligation                            Lab Results  Component Value Date   WBC 13.1 (H) 03/02/2019   HGB 8.4 (L) 03/02/2019   HCT 27.9 (L) 03/02/2019   MCV 86.1 03/02/2019   PLT 312 03/02/2019   Lab Results  Component Value Date   CREATININE 4.06 (H) 03/02/2019   BUN 46 (H) 03/02/2019   NA 126 (L) 03/02/2019   K 4.2 03/02/2019   CL 97 (L) 03/02/2019   CO2 19 (L) 03/02/2019    Anesthesia Physical  Anesthesia Plan  ASA: III  Anesthesia Plan: General   Post-op Pain Management:    Induction: Intravenous  PONV Risk Score and Plan: 3 and Dexamethasone, Ondansetron and Treatment may vary due to age or medical condition  Airway Management Planned: LMA  Additional Equipment: None  Intra-op Plan:   Post-operative Plan: Extubation in OR  Informed Consent: I have reviewed the patients History and Physical, chart, labs and discussed the procedure including the risks, benefits and alternatives for the proposed  anesthesia with the patient or authorized representative who has indicated his/her understanding and acceptance.     Dental advisory given  Plan Discussed with: CRNA  Anesthesia Plan Comments: (Will try LMA supreme with ETT as backup. History of poor LMA seal in past necessitating ETT placement.  )       Anesthesia Quick Evaluation

## 2019-03-03 NOTE — ED Notes (Signed)
Pt arrived via [facility] from [facility] ED. Bleeding controlled at this time. NAD.

## 2019-03-03 NOTE — Interval H&P Note (Signed)
History and Physical Interval Note:  03/03/2019 10:40 AM  Samantha Pierce  has presented today for surgery, with the diagnosis of Left Arm Hematoma.  The various methods of treatment have been discussed with the patient and family. After consideration of risks, benefits and other options for treatment, the patient has consented to  Procedure(s): EVACUATION HEMATOMA (Left) as a surgical intervention.  The patient's history has been reviewed, patient examined, no change in status, stable for surgery.  I have reviewed the patient's chart and labs.  Questions were answered to the patient's satisfaction.     Ruta Hinds

## 2019-03-03 NOTE — ED Notes (Signed)
No Valuables to lock up.  Patient has cell phone and clothes only at bedside.

## 2019-03-03 NOTE — ED Notes (Signed)
Consulting physician saw pt, pt to go to surgery today at some point and then will be d/c after surgery. Pt will not be admitted. Pt to be NPO at this time.

## 2019-03-03 NOTE — Progress Notes (Signed)
NEW ADMISSION NOTE New Admission Note:   Arrival Method: on stretcher from PACU Mental Orientation: alert and oriented x4  Telemetry: box: 13 nsr Assessment: Completed Skin: ACE WRAP AND JP drain on left arm IV: right FA  Pain: 6 PAIN MEDS given  Tubes: JP drain on left arm  Safety Measures: Safety Fall Prevention Plan has been given, discussed and signed Admission: Completed [facility] Orientation: Patient has been orientated to the room, unit and staff.  Family: NA  Orders have been reviewed and implemented. Will continue to monitor the patient. Call light has been placed within reach and bed alarm has been activated.   Baldo Ash, RN

## 2019-03-03 NOTE — ED Provider Notes (Signed)
Care transferred from previous provider. See note for full HPI.   In summation, patient from [facility] for evaluation by vascular surgery. Fistula placed on 02/11/19 with wound dehiscence and drop in hemoglobin. Patient HD stable. Dr Early paged and has evaluated patient. Patient will be taken to OR today but will not require formal admission. Patient to be discharged after surgery from vascular surgery.  Pain controlled.  0915: Dialysis site no longer bleeding.  Patient requesting something additional for pain.  Rating 8/10.  Will order.  Updated patient surgery at 12:00 which she is agreeable to.  No other complaints.  1100: Pain controlled. No complaints.  Labs Reviewed  CBC WITH DIFFERENTIAL/PLATELET - Abnormal; Notable for the following components:      Result Value   WBC 13.1 (*)    RBC 3.24 (*)    Hemoglobin 8.4 (*)    HCT 27.9 (*)    MCH 25.9 (*)    Neutro Abs 10.3 (*)    Monocytes Absolute 1.8 (*)    Abs Immature Granulocytes 0.08 (*)    All other components within normal limits  BASIC METABOLIC PANEL - Abnormal; Notable for the following components:   Sodium 126 (*)    Chloride 97 (*)    CO2 19 (*)    Glucose, Bld 336 (*)    BUN 46 (*)    Creatinine, Ser 4.06 (*)    Calcium 7.9 (*)    GFR calc non Af Amer 12 (*)    GFR calc Af Amer 14 (*)    All other components within normal limits  SARS CORONAVIRUS 2 ([facility])  No results found.   Samantha Czaplicki A, PA-C 03/03/19 1312    Isla Pence, MD 03/03/19 1352

## 2019-03-03 NOTE — Op Note (Signed)
Procedure: Incision and drainage evacuation of hematoma left arm.  Preoperative diagnosis: Hematoma left arm  Postoperative diagnosis  Stage: General  Assistant: Nurse  Findings: 7 x 7 cm hematoma left axillary region, patent AV fistula, diffuse oozing from granulation tissue and surrounding soft tissues  10 flat Jackson-Pratt drain left axilla  Operative details monitor pain informed consent, patient taken the operating.  The patient placed in supine just operating table.  After induction of general anesthesia and placement of laryngeal mask, the patient's entire left upper extremities prepped and draped in usual sterile fashion.  Pre-existing longitudinal incision up into the axilla was opened carried down through subcutaneous tissues and a hematoma cavity was entered.  This is all coagulated blood no obvious evidence of infection.  This was all removed and the wound thoroughly irrigated with normal saline solution 2 L.  There was some diffuse oozing adjacent to the median nerve and soft tissues.  This was controlled with cautery and direct pressure.  The fistula was palpated and was patent had a thrill within it.  The wound was then irrigated once more and a flat Jackson-Pratt drain brought out through a separate stab incision on the medial aspect of the left upper arm.  This was sutured to the skin with nylon stitch.  The wound was then closed with a running 2-0 and 3-0 Vicryl suture in interrupted 3-0 nylon vertical mattress and simple sutures in the skin.  The patient tolerated procedure well and there were no complications.  Sponge and needle counts correct in the case.  Patient taken to recovery in stable condition.  Ruta Hinds, MD [facility] [place] Office: [phone] Pager: [phone]

## 2019-03-03 NOTE — H&P (View-Only) (Signed)
Patient name: Samantha Pierce MRN: [id number] DOB: 06-22-1970 Sex: female    HPI: Samantha Pierce is a 48 y.o. female seen in the emergency room for ongoing bleeding from left upper arm.  She has had a very complicated course.  She has stage IV renal insufficiency and is not on hemodialysis.  On 11/20/2018 she underwent for stage basilic vein fistula with Dr. Donzetta Matters.  She was returned to the operating room on 02/11/2019 for second stage basilic vein transposition fistula.  She presented to [facility], ER the night of this procedure having sensation of a popping sensation in her arm and noted some bleeding.  She had Ace wrap placed and was seen in our office by our physician assistant on 02/12/2019.  She had no ongoing bleeding and was instructed on Ace wrap and elevation.  She presented to the [facility] emergency room on 02/22/2019 with ongoing oozing from her incision.  There was no active bleeding at that time and she again was instructed to elevate her arm and had a Ace wrap placed.  Most recently she presented to our office on 02/26/2019 with continued oozing again felt to be no active bleeding with potential old hematoma and was redressed.  She presented to the emergency room at [facility] on the evening of 03/02/2019 with persistent bleeding from her arm.  She reports that this is been not active arterial red bleeding but continues to have oozing of what sounds like old blood from her arm.  She was taken to [facility] by EMS.  I was consulted and asked to have her transferred to [facility] for further evaluation.  She has no steal symptoms.  Is not on hemodialysis currently.  No current facility-administered medications for this encounter.    Current Outpatient Medications  Medication Sig Dispense Refill  . albuterol (PROVENTIL HFA;VENTOLIN HFA) 108 (90 BASE) MCG/ACT inhaler Inhale 1-2 puffs into the lungs every 6 (six) hours as needed for wheezing or shortness  of breath. (Patient taking differently: Inhale 1-2 puffs into the lungs every 6 (six) hours as needed for shortness of breath. ) 1 Inhaler 0  . amLODipine (NORVASC) 10 MG tablet Take 1 tablet (10 mg total) by mouth daily. 30 tablet 1  . atorvastatin (LIPITOR) 80 MG tablet Take 1 tablet (80 mg total) by mouth daily at 6 PM. (Patient not taking: Reported on 01/30/2019) 30 tablet 6  . cloNIDine (CATAPRES) 0.2 MG tablet Take 0.2 mg by mouth 2 (two) times daily.     . clopidogrel (PLAVIX) 75 MG tablet Take 1 tablet (75 mg total) by mouth daily. 30 tablet 0  . dorzolamide (TRUSOPT) 2 % ophthalmic solution Place 1 drop into both eyes 2 (two) times daily as needed (eye pain.).     Marland Kitchen FLUoxetine (PROZAC) 20 MG capsule Take 40 mg by mouth daily.     . furosemide (LASIX) 40 MG tablet Take 40 mg by mouth daily.     Marland Kitchen gabapentin (NEURONTIN) 300 MG capsule Take 300 mg by mouth at bedtime.     . hydrALAZINE (APRESOLINE) 25 MG tablet Take 1 tablet (25 mg total) by mouth 2 (two) times daily. 60 tablet 1  . HYDROcodone-acetaminophen (NORCO/VICODIN) 5-325 MG tablet Take 1 tablet by mouth every 6 (six) hours as needed for moderate pain. 20 tablet 0  . insulin aspart (NOVOLOG) 100 UNIT/ML FlexPen Inject 10 Units into the skin 3 (three) times daily before meals.     . Insulin Detemir (  LEVEMIR) 100 UNIT/ML Pen Inject 50 Units into the skin at bedtime. (Patient taking differently: Inject 48-80 Units into the skin at bedtime. Prescribed 80 units at bedtime. Patient takes half dose if small meal is eaten at supper) 15 mL   . metoprolol succinate (TOPROL-XL) 50 MG 24 hr tablet Take 50 mg by mouth daily.    Marland Kitchen omeprazole (PRILOSEC) 20 MG capsule Take 20 mg by mouth daily before breakfast.     . sodium bicarbonate 650 MG tablet Take 650 mg by mouth 2 (two) times daily.     . SYMBICORT 160-4.5 MCG/ACT inhaler Inhale 2 puffs into the lungs 2 (two) times daily.     Marland Kitchen tiotropium (SPIRIVA) 18 MCG inhalation capsule Place 18 mcg into  inhaler and inhale 3 (three) times a week.     . traZODone (DESYREL) 100 MG tablet Take 100 mg by mouth at bedtime.       PHYSICAL EXAM: Vitals:   03/02/19 2141 03/02/19 2200 03/03/19 0038 03/03/19 0217  BP: (!) 103/56 (!) 145/70 (!) 133/53 (!) 125/54  Pulse: 79 76 74 71  Resp: 18   18  Temp:    98.4 F (36.9 C)  TempSrc:    Oral  SpO2: 100% 97% 99% 99%  Weight:      Height:        GENERAL: The patient is a well-nourished female, in no acute distress. The vital signs are documented above. She has a large hematoma in her upper arm harvest site.  She does have a thrill present.  Due to the persistent hematoma she does have some thickening in her skin.  There is no evidence of infection.  She does have separation of her skin in the vein harvest incision with old blood present here as well  Potassium is 4.2.  Hemoglobin and hematocrit are 8.4 and 27.9.  Coronavirus test is negative  MEDICAL ISSUES: Large hematoma in her tunnel track from basilic vein harvest for basilic vein fistula.  She has presented to the emergency room on 3 occasions in the last 3 weeks since the surgery with continued persistent oozing from this.  She clearly is failed conservative treatment.  I have recommended that she be returned to the operating room for evacuation of hematoma and washout of her wound.  Fortunately this does appear to be remote from her fistula itself.  She will be made n.p.o. in the emergency room.  She will not require admission and will be taken to the operating room later today   Rosetta Posner, MD [facility] Tel [phone] Pager [phone]

## 2019-03-03 NOTE — Anesthesia Procedure Notes (Signed)
Procedure Name: LMA Insertion Date/Time: 03/03/2019 11:16 AM Performed by: Elayne Snare, CRNA Pre-anesthesia Checklist: Patient identified, Emergency Drugs available, Suction available and Patient being monitored Patient Re-evaluated:Patient Re-evaluated prior to induction Oxygen Delivery Method: Circle System Utilized Preoxygenation: Pre-oxygenation with 100% oxygen Induction Type: IV induction Ventilation: Mask ventilation without difficulty LMA: LMA inserted and LMA with gastric port inserted LMA Size: 4.0 Number of attempts: 1 Placement Confirmation: positive ETCO2 Tube secured with: Tape Dental Injury: Teeth and Oropharynx as per pre-operative assessment

## 2019-03-03 NOTE — Progress Notes (Addendum)
Pharmacy Antibiotic Note  Samantha Pierce is a 48 y.o. female who presented to [facility] on 03/02/2019 with ongoing bleeding from left upper arm with diagnosis of left arm hematoma in tunnel track from basilic vein harvest for basilic vein fistula. Pt transferred to [facility], where she underwent I & D, evacuation of left arm hematoma this afternoon. Pharmacy has been consulted for vancomycin dosing for 24 hrs of surgical prophylaxis.  Patient rec'd vancomycin 1500 mg IV X 1 pre-op at 11:34 AM today.  Medical history includes: CKD IV (not on HD); current Scr 4.4; CrCl 19.6 ml/min  Plan: Administer vancomycin 1250 mg IV X 1 at 01:30 AM on 9/29. With  pt's poor renal function and pre-op vancomycin 1500 mg IV dose already rec'd, this dose should provide adequate vancomycin levels for 24 hrs of surgical prophylaxis.  Height: 5\' 4"  (162.6 cm) Weight: 258 lb (117 kg) IBW/kg (Calculated) : 54.7  Temp (24hrs), Avg:98.5 F (36.9 C), Min:97.9 F (36.6 C), Max:99.1 F (37.3 C)  Recent Labs  Lab 03/02/19 2252 03/03/19 1055  WBC 13.1*  --   CREATININE 4.06* 4.40*    Estimated Creatinine Clearance: 19.6 mL/min (A) (by C-G formula based on SCr of 4.4 mg/dL (H)).    Allergies  Allergen Reactions  . Contrast Media [Iodinated Diagnostic Agents] Anaphylaxis    "code blue--I died"  . Penicillins Rash and Other (See Comments)    Has patient had a PCN reaction causing immediate rash, facial/tongue/throat swelling, SOB or lightheadedness with hypotension: Yes Has patient had a PCN reaction causing severe rash involving mucus membranes or skin necrosis: No Has patient had a PCN reaction that required hospitalization No Has patient had a PCN reaction occurring within the last 10 years: No If all of the above answers are "NO", then may proceed with Cephalosporin use.     Microbiology results: 9/28 COVID: negative  Thank you for allowing pharmacy to be a part of this patient's  care.  Gillermina Hu, PharmD, BCPS, [facility] Clinical Pharmacist 03/03/2019 3:54 PM

## 2019-03-04 ENCOUNTER — Other Ambulatory Visit: Payer: Self-pay

## 2019-03-04 ENCOUNTER — Encounter (HOSPITAL_COMMUNITY): Payer: Self-pay | Admitting: Vascular Surgery

## 2019-03-04 LAB — BASIC METABOLIC PANEL
Anion gap: 9 (ref 5–15)
BUN: 47 mg/dL — ABNORMAL HIGH (ref 6–20)
CO2: 21 mmol/L — ABNORMAL LOW (ref 22–32)
Calcium: 7.8 mg/dL — ABNORMAL LOW (ref 8.9–10.3)
Chloride: 96 mmol/L — ABNORMAL LOW (ref 98–111)
Creatinine, Ser: 4.72 mg/dL — ABNORMAL HIGH (ref 0.44–1.00)
GFR calc Af Amer: 12 mL/min — ABNORMAL LOW (ref >60)
GFR calc non Af Amer: 10 mL/min — ABNORMAL LOW (ref >60)
Glucose, Bld: 339 mg/dL — ABNORMAL HIGH (ref 70–99)
Potassium: 4.6 mmol/L (ref 3.5–5.1)
Sodium: 126 mmol/L — ABNORMAL LOW (ref 135–145)

## 2019-03-04 LAB — GLUCOSE, CAPILLARY
Glucose-Capillary: 378 mg/dL — ABNORMAL HIGH (ref 70–99)
Glucose-Capillary: 214 mg/dL — ABNORMAL HIGH (ref 70–99)

## 2019-03-04 MED ORDER — INSULIN DETEMIR 100 UNIT/ML ~~LOC~~ SOLN
40.0000 [IU] | Freq: Every day | SUBCUTANEOUS | Status: DC
  Administered 2019-03-04 – 2019-03-05 (×2): 40 [IU] via SUBCUTANEOUS
  Filled 2019-03-04 (×3): qty 0.4

## 2019-03-04 MED ORDER — INSULIN ASPART 100 UNIT/ML ~~LOC~~ SOLN
0.0000 [IU] | Freq: Every day | SUBCUTANEOUS | Status: DC
  Administered 2019-03-04: 2 [IU] via SUBCUTANEOUS
  Administered 2019-03-05: 3 [IU] via SUBCUTANEOUS
  Administered 2019-03-06: 5 [IU] via SUBCUTANEOUS

## 2019-03-04 MED ORDER — INSULIN ASPART 100 UNIT/ML ~~LOC~~ SOLN
0.0000 [IU] | Freq: Three times a day (TID) | SUBCUTANEOUS | Status: DC
  Administered 2019-03-04: 9 [IU] via SUBCUTANEOUS
  Administered 2019-03-05: 2 [IU] via SUBCUTANEOUS
  Administered 2019-03-05: 5 [IU] via SUBCUTANEOUS
  Administered 2019-03-05 – 2019-03-06 (×2): 3 [IU] via SUBCUTANEOUS

## 2019-03-04 MED ORDER — INSULIN ASPART 100 UNIT/ML ~~LOC~~ SOLN
5.0000 [IU] | Freq: Three times a day (TID) | SUBCUTANEOUS | Status: DC
  Administered 2019-03-04 – 2019-03-06 (×6): 5 [IU] via SUBCUTANEOUS

## 2019-03-04 NOTE — Plan of Care (Signed)
  Problem: Education: Goal: Knowledge of General Education information will improve Description: Including pain rating scale, medication(s)/side effects and non-pharmacologic comfort measures Outcome: Completed/Met   Problem: Clinical Measurements: Goal: Diagnostic test results will improve Outcome: Completed/Met Goal: Cardiovascular complication will be avoided Outcome: Completed/Met   Problem: Activity: Goal: Risk for activity intolerance will decrease Outcome: Completed/Met   Problem: Nutrition: Goal: Adequate nutrition will be maintained Outcome: Completed/Met   Problem: Skin Integrity: Goal: Risk for impaired skin integrity will decrease Outcome: Completed/Met

## 2019-03-04 NOTE — Progress Notes (Signed)
[facility] [place]  Subjective  - arm hurts   Objective (!) 155/53 81 98.2 F (36.8 C) (Oral) 18 97%  Intake/Output Summary (Last 24 hours) at 03/04/2019 0852 Last data filed at 03/04/2019 [id number] Gross per 24 hour  Intake 960 ml  Output 170 ml  Net 790 ml   JP 70 bloody  Left hand warm pink  Assessment/Planning: S/p I and D hematoma left arm Will need to keep drain for until at least Thursday regardless of out put to make sure we shrink hematoma cavity  Ruta Hinds 03/04/2019 8:52 AM --  Laboratory Lab Results: Recent Labs    03/02/19 2252 03/03/19 1055  WBC 13.1*  --   HGB 8.4* 9.5*  HCT 27.9* 28.0*  PLT 312  --    BMET Recent Labs    03/02/19 2252 03/03/19 1055 03/04/19 0616  NA 126* 132* 126*  K 4.2 4.2 4.6  CL 97* 98 96*  CO2 19*  --  21*  GLUCOSE 336* 122* 339*  BUN 46* 42* 47*  CREATININE 4.06* 4.40* 4.72*  CALCIUM 7.9*  --  7.8*    COAG Lab Results  Component Value Date   INR 0.97 06/01/2015   INR 1.00 06/01/2015   INR 0.97 05/22/2015   No results found for: PTT

## 2019-03-04 NOTE — Plan of Care (Signed)
  Problem: Coping: Goal: Level of anxiety will decrease Outcome: Progressing   Problem: Pain Managment: Goal: General experience of comfort will improve Outcome: Progressing   

## 2019-03-04 NOTE — Progress Notes (Signed)
Inpatient Diabetes Program Recommendations  AACE/ADA: New Consensus Statement on Inpatient Glycemic Control (2015)  Target Ranges:  Prepandial:   less than 140 mg/dL      Peak postprandial:   less than 180 mg/dL (1-2 hours)      Critically ill patients:  140 - 180 mg/dL   Lab Results  Component Value Date   GLUCAP 123 (H) 03/03/2019   HGBA1C 9.8 (H) 01/19/2019    Review of Glycemic Control Results for Samantha Pierce, Samantha Pierce (MRN [id number]) as of 03/04/2019 12:55  Ref. Range 03/04/2019 06:16  Glucose Latest Ref Range: 70 - 99 mg/dL 339 (H)   Diabetes history: DM2 Outpatient Diabetes medications: Levemir 80 units q hs + Novolog 10 units tid meal coverage Current orders for Inpatient glycemic control: None  Inpatient Diabetes Program Recommendations:   -Levemir 40 units q hs (start now instead of waiting until hs) -Novolog 5 units tid meal coverage if eats 50% -Glycemic control order set with Novolog sensitive correction tid + hs 0-5 units  Thank you, Bethena Roys E. Jerimah Witucki, RN, MSN, CDE  Diabetes Coordinator Inpatient Glycemic Control Team Team Pager [phone] (8am-5pm) 03/04/2019 1:01 PM

## 2019-03-05 LAB — GLUCOSE, CAPILLARY
Glucose-Capillary: 247 mg/dL — ABNORMAL HIGH (ref 70–99)
Glucose-Capillary: 271 mg/dL — ABNORMAL HIGH (ref 70–99)
Glucose-Capillary: 167 mg/dL — ABNORMAL HIGH (ref 70–99)
Glucose-Capillary: 258 mg/dL — ABNORMAL HIGH (ref 70–99)

## 2019-03-05 NOTE — Progress Notes (Signed)
Pharmacy Antibiotic Note  Samantha Pierce is a 48 y.o. female who presented to [facility] on 03/02/2019 with ongoing bleeding from left upper arm with diagnosis of left arm hematoma in tunnel track from basilic vein harvest for basilic vein fistula. Pt transferred to [facility], where she underwent I & D, evacuation of left arm hematoma this afternoon. Pharmacy has been consulted to continue Vancomycin   Patient rec'd vancomycin 1500 mg IV X 1 9/28 at 1330 pm and 1250 mg x 1 9/29 at 1:30 am  Medical history includes: CKD IV (not on HD); current Scr 4.72  Plan: With CKD, no further doses of vancomycin needed now Vancomycin level in AM Re-dose Vancomycin then if needed  Height: 5\' 4"  (162.6 cm) Weight: 248 lb 14.4 oz (112.9 kg) IBW/kg (Calculated) : 54.7  Temp (24hrs), Avg:98.2 F (36.8 C), Min:98.1 F (36.7 C), Max:98.3 F (36.8 C)  Recent Labs  Lab 03/02/19 2252 03/03/19 1055 03/04/19 0616  WBC 13.1*  --   --   CREATININE 4.06* 4.40* 4.72*    Estimated Creatinine Clearance: 17.9 mL/min (A) (by C-G formula based on SCr of 4.72 mg/dL (H)).    Allergies  Allergen Reactions  . Contrast Media [Iodinated Diagnostic Agents] Anaphylaxis    "code blue--I died"  . Penicillins Rash and Other (See Comments)    Has patient had a PCN reaction causing immediate rash, facial/tongue/throat swelling, SOB or lightheadedness with hypotension: Yes Has patient had a PCN reaction causing severe rash involving mucus membranes or skin necrosis: No Has patient had a PCN reaction that required hospitalization No Has patient had a PCN reaction occurring within the last 10 years: No If all of the above answers are "NO", then may proceed with Cephalosporin use.     Microbiology results: 9/28 COVID: negative  Thank you Anette Guarneri, PharmD [phone] 03/05/2019 9:14 AM

## 2019-03-05 NOTE — Progress Notes (Addendum)
[facility]   Subjective  - arm still sore   Objective (!) 174/74 89 98.2 F (36.8 C) (Oral) 18 100%  Intake/Output Summary (Last 24 hours) at 03/05/2019 0840 Last data filed at 03/05/2019 0600 Gross per 24 hour  Intake 1740 ml  Output 840 ml  Net 900 ml   Left arm drain brown fluid Incision with some brown drainage  No erythema + thrill in fistula Drain removed incision partially opened and packed  Wound culture sent  Assessment/Planning: Most likely hematoma was infected will pack open Will follow up on culture Will most likely d/c home tomorrow with home health wet to dry dressings Start vanc transition to oral based on culture  [facility] 03/05/2019 8:40 AM --  Laboratory Lab Results: Recent Labs    03/02/19 2252 03/03/19 1055  WBC 13.1*  --   HGB 8.4* 9.5*  HCT 27.9* 28.0*  PLT 312  --    BMET Recent Labs    03/02/19 2252 03/03/19 1055 03/04/19 0616  NA 126* 132* 126*  K 4.2 4.2 4.6  CL 97* 98 96*  CO2 19*  --  21*  GLUCOSE 336* 122* 339*  BUN 46* 42* 47*  CREATININE 4.06* 4.40* 4.72*  CALCIUM 7.9*  --  7.8*    COAG Lab Results  Component Value Date   INR 0.97 06/01/2015   INR 1.00 06/01/2015   INR 0.97 05/22/2015   No results found for: PTT

## 2019-03-05 NOTE — Progress Notes (Signed)
Inpatient Diabetes Program Recommendations  AACE/ADA: New Consensus Statement on Inpatient Glycemic Control (2015)  Target Ranges:  Prepandial:   less than 140 mg/dL      Peak postprandial:   less than 180 mg/dL (1-2 hours)      Critically ill patients:  140 - 180 mg/dL   Lab Results  Component Value Date   GLUCAP 167 (H) 03/05/2019   HGBA1C 9.8 (H) 01/19/2019    Review of Glycemic Control  Blood sugars running above goal. Needs insulin adjustment.  Inpatient Diabetes Program Recommendations:     Increase Levemir to 45 units QHS Increase Novolog to 7 units tidwc for meal coverage insulin  Will continue to follow.  Thank you. Lorenda Peck, RD, LDN, CDE Inpatient Diabetes Coordinator [phone]

## 2019-03-06 LAB — VANCOMYCIN, RANDOM: Vancomycin Rm: 14

## 2019-03-06 LAB — GLUCOSE, CAPILLARY
Glucose-Capillary: 225 mg/dL — ABNORMAL HIGH (ref 70–99)
Glucose-Capillary: 274 mg/dL — ABNORMAL HIGH (ref 70–99)

## 2019-03-06 MED ORDER — LEVOFLOXACIN 250 MG PO TABS: 250.0000 mg | ORAL_TABLET | Freq: Every day | ORAL | 0 refills | Status: DC

## 2019-03-06 MED ORDER — HYDROCODONE-ACETAMINOPHEN 5-325 MG PO TABS: 1.0000 | ORAL_TABLET | Freq: Four times a day (QID) | ORAL | 0 refills | Status: DC | PRN

## 2019-03-06 NOTE — Discharge Instructions (Signed)
° °  [facility] [place] ° °Discharge Instructions ° °AV Fistula or Graft Surgery for Dialysis Access ° °Please refer to the following instructions for your post-procedure care. Your surgeon or physician assistant will discuss any changes with you. ° °Activity ° °You may drive the day following your surgery, if you are comfortable and no longer taking prescription pain medication. Resume full activity as the soreness in your incision resolves. ° °Bathing/Showering ° °You may shower after you go home. Keep your incision dry for 48 hours. Do not soak in a bathtub, hot tub, or swim until the incision heals completely. You may not shower if you have a hemodialysis catheter. ° °Incision Care ° °Clean your incision with mild soap and water after 48 hours. Pat the area dry with a clean towel. You do not need a bandage unless otherwise instructed. Do not apply any ointments or creams to your incision. You may have skin glue on your incision. Do not peel it off. It will come off on its own in about one week. Your arm may swell a bit after surgery. To reduce swelling use pillows to elevate your arm so it is above your heart. Your doctor will tell you if you need to lightly wrap your arm with an ACE bandage. ° °Diet ° °Resume your normal diet. There are not special food restrictions following this procedure. In order to heal from your surgery, it is CRITICAL to get adequate nutrition. Your body requires vitamins, minerals, and protein. Vegetables are the best source of vitamins and minerals. Vegetables also provide the perfect balance of protein. Processed food has little nutritional value, so try to avoid this. ° °Medications ° °Resume taking all of your medications. If your incision is causing pain, you may take over-the counter pain relievers such as acetaminophen (Tylenol). If you were prescribed a stronger pain medication, please be aware these medications can cause nausea and constipation. Prevent  nausea by taking the medication with a snack or meal. Avoid constipation by drinking plenty of fluids and eating foods with high amount of fiber, such as fruits, vegetables, and grains. Do not take Tylenol if you are taking prescription pain medications. ° ° ° ° °Follow up °Your surgeon may want to see you in the office following your access surgery. If so, this will be arranged at the time of your surgery. ° °Please call us immediately for any of the following conditions: ° °Increased pain, redness, drainage (pus) from your incision site °Fever of 101 degrees or higher °Severe or worsening pain at your incision site °Hand pain or numbness. ° °Reduce your risk of vascular disease: ° °Stop smoking. If you would like help, call [facility] at 1-800-QUIT-NOW (1-800-784-8669) or [facility] at 336-586-4000 ° °Manage your cholesterol °Maintain a desired weight °Control your diabetes °Keep your blood pressure down ° °Dialysis ° °It will take several weeks to several months for your new dialysis access to be ready for use. Your surgeon will determine when it is OK to use it. Your nephrologist will continue to direct your dialysis. You can continue to use your Permcath until your new access is ready for use. ° °If you have any questions, please call the office at [phone]. ° °

## 2019-03-06 NOTE — Progress Notes (Addendum)
  Progress Note    03/06/2019 7:59 AM 3 Days Post-Op  Subjective:  Having anxiety this morning   Vitals:   03/06/19 0639 03/06/19 0731  BP: (!) 200/86 (!) 192/69  Pulse: 84 79  Resp:  20  Temp:    SpO2:  100%   Physical Exam: Lungs:  Non labored Incisions:  L arm wound without purulence, healthy wound bed Extremities:  Palpable thrill L arm fistula Neurologic: A&O  CBC    Component Value Date/Time   WBC 13.1 (H) 03/02/2019 2252   RBC 3.24 (L) 03/02/2019 2252   HGB 9.5 (L) 03/03/2019 1055   HCT 28.0 (L) 03/03/2019 1055   PLT 312 03/02/2019 2252   MCV 86.1 03/02/2019 2252   MCH 25.9 (L) 03/02/2019 2252   MCHC 30.1 03/02/2019 2252   RDW 13.1 03/02/2019 2252   LYMPHSABS 0.8 03/02/2019 2252   MONOABS 1.8 (H) 03/02/2019 2252   EOSABS 0.1 03/02/2019 2252   BASOSABS 0.1 03/02/2019 2252    BMET    Component Value Date/Time   NA 126 (L) 03/04/2019 0616   K 4.6 03/04/2019 0616   CL 96 (L) 03/04/2019 0616   CO2 21 (L) 03/04/2019 0616   GLUCOSE 339 (H) 03/04/2019 0616   BUN 47 (H) 03/04/2019 0616   CREATININE 4.72 (H) 03/04/2019 0616   CALCIUM 7.8 (L) 03/04/2019 0616   GFRNONAA 10 (L) 03/04/2019 0616   GFRAA 12 (L) 03/04/2019 0616    INR    Component Value Date/Time   INR 0.97 06/01/2015 1255     Intake/Output Summary (Last 24 hours) at 03/06/2019 0759 Last data filed at 03/06/2019 [id number] Gross per 24 hour  Intake 1160 ml  Output 0 ml  Net 1160 ml     Assessment/Plan:  48 y.o. female is s/p L arm hematoma evac 3 Days Post-Op   Anxiety medication ordered Will transition to 2 weeks of levaquin Ok for discharge this morning with [facility] RN for wet to dry packing dressing changes Follow up in office in 2 weeks   Dagoberto Ligas, PA-C [facility] [phone] 03/06/2019 7:59 AM  Agree with above levaquin for gm neg gm + coverage Follow up Cx D/c home today with home health  Follow up 2 weeks  Ruta Hinds, MD [facility] [place] Office: [phone] Pager: [phone]

## 2019-03-06 NOTE — TOC Initial Note (Signed)
Transition of Care [facility]) - Initial/Assessment Note    Patient Details  Name: Samantha Pierce MRN: [id number] Date of Birth: [birth year]  Transition [facility]) CM/SW Contact:    Bartholomew Crews, RN Phone Number: [phone] 03/06/2019, 10:06 AM  Clinical Narrative:                 Spoke with patient at the bedside. PTA home with child. Independent. Active with [facility] for RN. Needs RN for dressing changes. Notified Tiffany at [facility] of transition home today and need for dressing changes. Patient states that she has someone available to pick her up today. Gets her medications filled at [facility] - no concerns. No further transition of care needs identified.   Expected Discharge Plan: [place] Barriers to Discharge: No Barriers Identified   Patient Goals and CMS Choice Patient states their goals for this hospitalization and ongoing recovery are:: ready to go home CMS Medicare.gov Compare Post Acute Care list provided to:: Patient Choice offered to / list presented to : Patient  Expected Discharge Plan and Services Expected Discharge Plan: [place] In-house Referral: NA Discharge Planning Services: CM Consult Post Acute Care Choice: [place] arrangements for the past 2 months: Single Family Home Expected Discharge Date: 03/06/19               DME Arranged: N/A DME Agency: NA       HH Arranged: RN [address] Agency: [facility] [employer] [facility] [employer] Date [place]: 03/06/19 Time [place]: 1005 Representative spoke with at [place]: [place]  Prior Living Arrangements/Services Living arrangements for the past 2 months: [place] with:: Self, Minor Children Patient language and need for interpreter reviewed:: Yes            Current home services: Home RN Criminal Activity/Legal Involvement Pertinent to Current Situation/Hospitalization: No - Comment as needed  Activities of Daily  Living      Permission Sought/Granted                  Emotional Assessment Appearance:: Appears stated age Attitude/Demeanor/Rapport: Engaged Affect (typically observed): Accepting Orientation: : Oriented to Self, Oriented to Place, Oriented to  Time, Oriented to Situation Alcohol / Substance Use: Not Applicable Psych Involvement: No (comment)  Admission diagnosis:  Disruption of external surgical wound, subsequent encounter [T81.31XD] Patient Active Problem List   Diagnosis Date Noted  . Traumatic hematoma of left upper arm 03/03/2019  . Chronic kidney disease (CKD), stage IV (severe) ([place]) 02/26/2019  . Tobacco abuse 01/20/2019  . Cocaine abuse ([place]) 01/20/2019  . Uncontrolled type 2 diabetes mellitus with hyperglycemia, with long-term current use of insulin ([place]) 01/20/2019  . Acute diastolic CHF (congestive heart failure) ([place]) 01/20/2019  . Dyspnea 01/19/2019  . AKI (acute kidney injury) ([place]) 11/19/2017  . COPD (chronic obstructive pulmonary disease) ([place]) 11/19/2017  . GERD (gastroesophageal reflux disease) 11/19/2017  . Mild CAD 06/02/2015  . Bradycardia 06/02/2015  . Morbid obesity ([place])   . Asystole ([place])   . Pain in the chest   . Hyperlipidemia   . TIA (transient ischemic attack) 05/22/2015  . Diastolic dysfunction 99991111  . Essential hypertension 09/04/2014  . Uncontrolled diabetes mellitus with stage 4 chronic kidney disease ([place]) 09/04/2014  . Lacunar stroke, acute ([place]) 09/04/2014  . Paresthesias/numbness 09/04/2014  . CKD (chronic kidney disease), stage III ([place]) 09/04/2014  . Obesity 09/04/2014  . CVA (cerebral infarction) 09/04/2014  .  Proliferative diabetic retinopathy of both eyes ([place]) 04/11/2011  . Retinal detachment, traction 04/11/2011   PCP:  Rosita Fire, MD Pharmacy:   [facility], [address] [place] [address] Phone: [phone] Fax: [place], [place] [address] Phone: [phone] Fax: [phone]     Social Determinants of Health (SDOH) Interventions    Readmission Risk Interventions Readmission Risk Prevention Plan 01/20/2019  Transportation Screening Complete  HRI or [place] Complete  Social Work Consult for [place] Planning/Counseling Complete  Palliative Care Screening Not Applicable  Medication Review Press photographer) Complete  Some recent data might be hidden

## 2019-03-06 NOTE — Plan of Care (Signed)
  Problem: Health Behavior/Discharge Planning: Goal: Ability to manage health-related needs will improve Outcome: Progressing   

## 2019-03-06 NOTE — Discharge Summary (Signed)
Physician Discharge Summary   Patient ID: Samantha Pierce [id number] 47 y.o. April 29, 1971  Admit date: 03/02/2019  Discharge date and time: 03/06/2019  1:20 PM   Admitting Physician: Merrily Pew, MD   Discharge Physician: Dr. Oneida Alar  Admission Diagnoses: Disruption of external surgical wound, subsequent encounter [T81.31XD]  Discharge Diagnoses: Same  Admission Condition: fair  Discharged Condition: fair  Indication for Admission: Hematoma and nonhealing surgical wound left arm  Hospital Course: Ms. Samantha Pierce is a 48 year old female who underwent left second stage basilic vein transposition by Dr. Donzetta Matters on 02/11/2019.  She developed a hematoma postoperatively.  Initially this was managed conservatively with compression and elevation of left arm.  However after several office visits and ER visits conservative management failed and she underwent evacuation of left arm hematoma by Dr. Oneida Alar on 03/03/2019.  JP drain was also placed during this procedure.  Postoperative course consisted of pain control as well as IV antibiotics due to a suspected infected hematoma.  No organisms were identified by culture.  She underwent wet-to-dry packing dressing changes at least daily with Ace wrap compression during her hospitalization.  She will be discharged with a 2-week course of Levaquin.  Case management was also consulted to arrange home health RN for wet-to-dry packing dressing changes.  The patient will follow-up for wound check in 2 weeks with PA.  She was also prescribed several days of narcotic pain medication for continued postoperative pain control.  At the time of discharge left arm edema is much improved, wound bed is demonstrating good granulation tissue, and she still has a palpable thrill throughout transposed basilic vein fistula.  She was discharged this morning in stable condition.  Consults: None  Treatments: surgery: Evacuation of hematoma of left arm  Discharge Exam: See  progress note 03/06/2019 Vitals:   03/06/19 0639 03/06/19 0731  BP: (!) 200/86 (!) 192/69  Pulse: 84 79  Resp:  20  Temp:    SpO2:  100%     Disposition: Discharge disposition: 01-Home or Self Care       Patient Instructions:  Allergies as of 03/06/2019      Reactions   Contrast Media [iodinated Diagnostic Agents] Anaphylaxis   "code blue--I died"   Penicillins Rash, Other (See Comments)   Has patient had a PCN reaction causing immediate rash, facial/tongue/throat swelling, SOB or lightheadedness with hypotension: Yes Has patient had a PCN reaction causing severe rash involving mucus membranes or skin necrosis: No Has patient had a PCN reaction that required hospitalization No Has patient had a PCN reaction occurring within the last 10 years: No If all of the above answers are "NO", then may proceed with Cephalosporin use.      Medication List    TAKE these medications   albuterol 108 (90 Base) MCG/ACT inhaler Commonly known as: VENTOLIN HFA Inhale 1-2 puffs into the lungs every 6 (six) hours as needed for wheezing or shortness of breath. What changed: reasons to take this   amLODipine 10 MG tablet Commonly known as: NORVASC Take 1 tablet (10 mg total) by mouth daily.   atorvastatin 80 MG tablet Commonly known as: LIPITOR Take 1 tablet (80 mg total) by mouth daily at 6 PM. What changed: when to take this   budesonide-formoterol 160-4.5 MCG/ACT inhaler Commonly known as: SYMBICORT Inhale 2 puffs into the lungs every morning.   cloNIDine 0.2 MG tablet Commonly known as: CATAPRES Take 0.2 mg by mouth 2 (two) times daily.   clopidogrel 75 MG tablet Commonly  known as: PLAVIX Take 1 tablet (75 mg total) by mouth daily.   dorzolamide 2 % ophthalmic solution Commonly known as: TRUSOPT Place 1 drop into both eyes 2 (two) times daily as needed (eye pain.).   FLUoxetine 20 MG capsule Commonly known as: PROZAC Take 40 mg by mouth daily.   furosemide 40 MG  tablet Commonly known as: LASIX Take 80 mg by mouth daily.   gabapentin 300 MG capsule Commonly known as: NEURONTIN Take 300 mg by mouth at bedtime.   hydrALAZINE 25 MG tablet Commonly known as: APRESOLINE Take 1 tablet (25 mg total) by mouth 2 (two) times daily.   HYDROcodone-acetaminophen 5-325 MG tablet Commonly known as: NORCO/VICODIN Take 1 tablet by mouth every 6 (six) hours as needed for moderate pain.   insulin aspart 100 UNIT/ML FlexPen Commonly known as: NOVOLOG Inject 10 Units into the skin 3 (three) times daily before meals.   Insulin Detemir 100 UNIT/ML Pen Commonly known as: LEVEMIR Inject 50 Units into the skin at bedtime. What changed: how much to take   levofloxacin 250 MG tablet Commonly known as: Levaquin Take 1 tablet (250 mg total) by mouth daily for 14 days.   metoprolol succinate 50 MG 24 hr tablet Commonly known as: TOPROL-XL Take 50 mg by mouth daily.   omeprazole 20 MG capsule Commonly known as: PRILOSEC Take 20 mg by mouth daily before breakfast.   sodium bicarbonate 650 MG tablet Take 650 mg by mouth 2 (two) times daily.   tiotropium 18 MCG inhalation capsule Commonly known as: SPIRIVA Place 18 mcg into inhaler and inhale every morning.   traZODone 100 MG tablet Commonly known as: DESYREL Take 100 mg by mouth at bedtime.      Activity: activity as tolerated Diet: regular diet Wound Care: Wet-to-dry packing to be performed at least once a day preferably twice.  Continue to elevate left arm and use Ace wrap for compression  Follow-up with Dr. Oneida Alar in 2 weeks.  SignedDagoberto Ligas 03/06/2019 1:33 PM

## 2019-03-06 NOTE — Care Management Important Message (Signed)
Important Message  Patient Details  Name: Samantha Pierce MRN: [id number] Date of Birth: January 20, 1971   Medicare Important Message Given:  Yes     Janene Yousuf Montine Circle 03/06/2019, 3:42 PM

## 2019-03-06 NOTE — Progress Notes (Signed)
[name] to be discharged Home per MD order. Discussed prescriptions and follow up appointments with the patient. Prescriptions given to patient; medication list explained in detail. Patient verbalized understanding.  Skin clean, dry and intact without evidence of skin break down, no evidence of skin tears noted. IV catheter discontinued intact. Site without signs and symptoms of complications. Dressing and pressure applied. Pt denies pain at the site currently. No complaints noted.  Patient free of lines, drains, and Dressing on left arm dry and intact  An After Visit Summary (AVS) was printed and given to the patient. Patient escorted via wheelchair, and discharged home via private auto.  Shela Commons, RN

## 2019-03-07 ENCOUNTER — Other Ambulatory Visit: Payer: Self-pay | Admitting: Physician Assistant

## 2019-03-07 ENCOUNTER — Telehealth: Payer: Self-pay | Admitting: *Deleted

## 2019-03-07 MED ORDER — CEFDINIR 300 MG PO CAPS: 300.0000 mg | ORAL_CAPSULE | Freq: Every day | ORAL | 0 refills | Status: AC

## 2019-03-07 NOTE — Telephone Encounter (Signed)
-----   Message from Dagoberto Ligas, PA-C sent at 03/07/2019 11:02 AM EDT ----- Can someone notify this patient that wound cultures returned today and we need to switch her antibiotics.  I ordered cefdinir 300mg  daily for 2 weeks to her pharmacy.  She will discontinue her levaquin prescription. Thanks, [facility]

## 2019-03-07 NOTE — Telephone Encounter (Signed)
Couldn't leave a msg for patient at this [phone] mailbox was full. I did LMTCB on [phone] that we had switched her antibiotic.

## 2019-03-08 LAB — AEROBIC CULTURE W GRAM STAIN (SUPERFICIAL SPECIMEN)

## 2019-03-16 ENCOUNTER — Telehealth: Payer: Self-pay | Admitting: Vascular Surgery

## 2019-03-16 NOTE — Telephone Encounter (Signed)
Pt s/p basilic vein fistula by Donzetta Matters 9/8.  Subsequent hematoma evacuation by me 9/28.  Pt concerned she is having some wound breakdown Will arrange for her to be seen in office tomorrow  She wants someone to call her sister during office visit  Ruta Hinds, MD [facility] [place] Office: [phone] Pager: [phone]

## 2019-03-17 ENCOUNTER — Other Ambulatory Visit: Payer: Self-pay

## 2019-03-17 ENCOUNTER — Ambulatory Visit (INDEPENDENT_AMBULATORY_CARE_PROVIDER_SITE_OTHER): Payer: Medicare HMO | Admitting: Physician Assistant

## 2019-03-17 VITALS — BP 128/74 | HR 68 | Temp 97.8°F | Resp 14 | Ht 64.0 in | Wt 248.0 lb

## 2019-03-17 DIAGNOSIS — S41102D Unspecified open wound of left upper arm, subsequent encounter: Secondary | ICD-10-CM

## 2019-03-17 NOTE — Progress Notes (Signed)
POST OPERATIVE OFFICE NOTE    CC:  F/u for surgery  HPI:  This is a 48 y.o. female who is s/p 7 x 7 cm hematoma left axillary region, patent AV fistula.  Left arm second stage basilic vein transposition fistula creation.  Left LE wet to dry dressing changes daily.   She is not on HD yet.    She states the swelling is better than pre-surgery.  She elevates the left arm and squeezes a ball for exercise.  Allergies  Allergen Reactions  . Contrast Media [Iodinated Diagnostic Agents] Anaphylaxis    "code blue--I died"  . Penicillins Rash and Other (See Comments)    Has patient had a PCN reaction causing immediate rash, facial/tongue/throat swelling, SOB or lightheadedness with hypotension: Yes Has patient had a PCN reaction causing severe rash involving mucus membranes or skin necrosis: No Has patient had a PCN reaction that required hospitalization No Has patient had a PCN reaction occurring within the last 10 years: No If all of the above answers are "NO", then may proceed with Cephalosporin use.     Current Outpatient Medications  Medication Sig Dispense Refill  . albuterol (PROVENTIL HFA;VENTOLIN HFA) 108 (90 BASE) MCG/ACT inhaler Inhale 1-2 puffs into the lungs every 6 (six) hours as needed for wheezing or shortness of breath. (Patient taking differently: Inhale 1-2 puffs into the lungs every 6 (six) hours as needed for shortness of breath. ) 1 Inhaler 0  . amLODipine (NORVASC) 10 MG tablet Take 1 tablet (10 mg total) by mouth daily. 30 tablet 1  . atorvastatin (LIPITOR) 80 MG tablet Take 1 tablet (80 mg total) by mouth daily at 6 PM. (Patient taking differently: Take 80 mg by mouth every morning. ) 30 tablet 6  . budesonide-formoterol (SYMBICORT) 160-4.5 MCG/ACT inhaler Inhale 2 puffs into the lungs every morning.    . cefdinir (OMNICEF) 300 MG capsule Take 1 capsule (300 mg total) by mouth daily for 14 days. 14 capsule 0  . cloNIDine (CATAPRES) 0.2 MG tablet Take 0.2 mg by mouth 2  (two) times daily.     . clopidogrel (PLAVIX) 75 MG tablet Take 1 tablet (75 mg total) by mouth daily. 30 tablet 0  . dorzolamide (TRUSOPT) 2 % ophthalmic solution Place 1 drop into both eyes 2 (two) times daily as needed (eye pain.).     Marland Kitchen FLUoxetine (PROZAC) 20 MG capsule Take 40 mg by mouth daily.     . furosemide (LASIX) 40 MG tablet Take 80 mg by mouth daily.     Marland Kitchen gabapentin (NEURONTIN) 300 MG capsule Take 300 mg by mouth at bedtime.     . hydrALAZINE (APRESOLINE) 25 MG tablet Take 1 tablet (25 mg total) by mouth 2 (two) times daily. 60 tablet 1  . HYDROcodone-acetaminophen (NORCO/VICODIN) 5-325 MG tablet Take 1 tablet by mouth every 6 (six) hours as needed for moderate pain. 12 tablet 0  . insulin aspart (NOVOLOG) 100 UNIT/ML FlexPen Inject 10 Units into the skin 3 (three) times daily before meals.     . Insulin Detemir (LEVEMIR) 100 UNIT/ML Pen Inject 50 Units into the skin at bedtime. (Patient taking differently: Inject 80 Units into the skin at bedtime. ) 15 mL   . metoprolol succinate (TOPROL-XL) 50 MG 24 hr tablet Take 50 mg by mouth daily.    Marland Kitchen omeprazole (PRILOSEC) 20 MG capsule Take 20 mg by mouth daily before breakfast.     . sodium bicarbonate 650 MG tablet Take 650 mg by  mouth 2 (two) times daily.     Marland Kitchen tiotropium (SPIRIVA) 18 MCG inhalation capsule Place 18 mcg into inhaler and inhale every morning.     . traZODone (DESYREL) 100 MG tablet Take 100 mg by mouth at bedtime.     No current facility-administered medications for this visit.      ROS:  See HPI  Physical Exam:    Incision:      Extremities:  Palpable left radial pulse grip 4+/5.     Assessment/Plan:  This is a 48 y.o. female who is s/p:evacuation of left arm hematoma s/p left AV fistula basilic vein transposition.  She will contniue wet to dry dressing changes daily. She may shower with soapy water as needed.  [place] RN for 2 additional weeks.  She lives alone.  Activity of ball squeezes daily and elevation  to help with left arm edema.  The surrounding tissue is soft.  F/U wound check in 2 weeks.   Roxy Horseman PA-C [facility] [phone]  Call MD:  Carlis Abbott

## 2019-04-01 ENCOUNTER — Other Ambulatory Visit: Payer: Self-pay

## 2019-04-01 ENCOUNTER — Ambulatory Visit (INDEPENDENT_AMBULATORY_CARE_PROVIDER_SITE_OTHER): Payer: Self-pay | Admitting: Physician Assistant

## 2019-04-01 VITALS — BP 158/72 | HR 74 | Temp 97.7°F | Resp 14 | Ht 67.0 in | Wt 264.5 lb

## 2019-04-01 DIAGNOSIS — N184 Chronic kidney disease, stage 4 (severe): Secondary | ICD-10-CM

## 2019-04-01 NOTE — Progress Notes (Signed)
    Postoperative Access Visit   History of Present Illness   Samantha Pierce is a 48 y.o. year old female who presents for postoperative follow-up for: left second stage basilic vein transposition with subsequent hematoma evacuation.  She has had [place] RN for packing of remaining open wound which is now nearly healed.  She denies signs or symptoms of steal syndrome.  She is not yet on hemodialysis.  She has an appointment with her Nephrologist this Friday.     Physical Examination   Vitals:   04/01/19 1450  BP: (!) 158/72  Pulse: 74  Resp: 14  Temp: 97.7 F (36.5 C)  TempSrc: Temporal  SpO2: 98%  Weight: 264 lb 8 oz (120 kg)  Height: 5\' 7"  (1.702 m)   Body mass index is 41.43 kg/m.  left arm Incision is 1.5cm L x 0.5cm W x 0.5cm D; healthy wound bed; palpable thrill through fistula; palpable L radial pulse     Medical Decision Making   SHAVELL BOWDISH is a 48 y.o. year old female who presents s/p left second stage basilic vein transposition with subsequent evacuation of hematoma   Patent transposed fistula without signs or symptoms of steal syndrome  Wound nearly healed; continue current wound care  Fistula will be ready for use when hemodialysis is initiated  The patient may follow up on a prn basis   Dagoberto Ligas PA-C [facility] [place] Office: [phone]  Clinic MD: Dr. Donnetta Hutching

## 2019-04-03 DIAGNOSIS — D631 Anemia in chronic kidney disease: Secondary | ICD-10-CM | POA: Insufficient documentation

## 2019-04-03 DIAGNOSIS — E7889 Other lipoprotein metabolism disorders: Secondary | ICD-10-CM | POA: Insufficient documentation

## 2019-04-15 ENCOUNTER — Encounter (HOSPITAL_COMMUNITY): Payer: Self-pay

## 2019-04-15 ENCOUNTER — Encounter (HOSPITAL_COMMUNITY)
Admission: RE | Admit: 2019-04-15 | Discharge: 2019-04-15 | Disposition: A | Discharge status: Home / Self Care | Payer: Medicare HMO | Source: Ambulatory Visit | Attending: Nephrology | Admitting: Nephrology

## 2019-04-15 ENCOUNTER — Other Ambulatory Visit: Payer: Self-pay

## 2019-04-15 DIAGNOSIS — D631 Anemia in chronic kidney disease: Secondary | ICD-10-CM | POA: Diagnosis not present

## 2019-04-15 DIAGNOSIS — N185 Chronic kidney disease, stage 5: Secondary | ICD-10-CM | POA: Insufficient documentation

## 2019-04-15 MED ORDER — SODIUM CHLORIDE 0.9 % IV SOLN
510.0000 mg | Freq: Once | INTRAVENOUS | Status: AC
  Administered 2019-04-15: 510 mg via INTRAVENOUS
  Filled 2019-04-15: qty 17

## 2019-04-15 MED ORDER — SODIUM CHLORIDE 0.9 % IV SOLN
Freq: Once | INTRAVENOUS | Status: AC
  Administered 2019-04-15: 10:00:00 via INTRAVENOUS

## 2019-04-22 ENCOUNTER — Other Ambulatory Visit: Payer: Self-pay

## 2019-04-22 ENCOUNTER — Encounter (HOSPITAL_COMMUNITY): Payer: Self-pay

## 2019-04-22 ENCOUNTER — Encounter (HOSPITAL_COMMUNITY)
Admission: RE | Admit: 2019-04-22 | Discharge: 2019-04-22 | Disposition: A | Discharge status: Home / Self Care | Payer: Medicare HMO | Source: Ambulatory Visit | Attending: Nephrology | Admitting: Nephrology

## 2019-04-22 DIAGNOSIS — N185 Chronic kidney disease, stage 5: Secondary | ICD-10-CM | POA: Diagnosis not present

## 2019-04-22 MED ORDER — SODIUM CHLORIDE 0.9 % IV SOLN
Freq: Once | INTRAVENOUS | Status: AC
  Administered 2019-04-22: 10:00:00 via INTRAVENOUS

## 2019-04-22 MED ORDER — SODIUM CHLORIDE 0.9 % IV SOLN
510.0000 mg | Freq: Once | INTRAVENOUS | Status: AC
  Administered 2019-04-22: 510 mg via INTRAVENOUS
  Filled 2019-04-22: qty 17

## 2019-05-21 ENCOUNTER — Encounter: Payer: Self-pay | Admitting: Gastroenterology

## 2019-06-12 ENCOUNTER — Telehealth: Payer: Self-pay | Admitting: Obstetrics & Gynecology

## 2019-06-12 NOTE — Telephone Encounter (Signed)
Called patient regarding appointment scheduled in our office and advised to come alone to the visit, however, a support person, over age 50, may accompany her to appointment if assistance is needed for safety or care concerns. Otherwise, support persons should remain outside until the visit is complete.   Prescreen questions asked: 1. Any of the following symptoms of COVID such as chills, fever, cough, shortness of breath, muscle pain, diarrhea, rash, vomiting, abdominal pain, red eye, weakness, bruising, bleeding, joint pain, loss of taste or smell, a severe headache, sore throat, fatigue 2. Any exposure to anyone suspected or confirmed of having COVID-19 3. Awaiting test results for COVID-19  Also,to keep you safe, please use the provided hand sanitizer when you enter the office. We are asking everyone in the office to wear a mask to help prevent the spread of germs. If you have a mask of your own, please wear it to your appointment, if not, we are happy to provide one for you.  Thank you for understanding and your cooperation.    [facility] Staff      

## 2019-06-16 ENCOUNTER — Ambulatory Visit (INDEPENDENT_AMBULATORY_CARE_PROVIDER_SITE_OTHER): Payer: Medicare HMO | Admitting: Obstetrics & Gynecology

## 2019-06-16 ENCOUNTER — Encounter: Payer: Self-pay | Admitting: Obstetrics & Gynecology

## 2019-06-16 ENCOUNTER — Other Ambulatory Visit (HOSPITAL_COMMUNITY)
Admission: RE | Admit: 2019-06-16 | Discharge: 2019-06-16 | Disposition: A | Discharge status: Home / Self Care | Payer: Medicare HMO | Source: Ambulatory Visit | Attending: Obstetrics & Gynecology | Admitting: Obstetrics & Gynecology

## 2019-06-16 ENCOUNTER — Other Ambulatory Visit: Payer: Self-pay

## 2019-06-16 VITALS — BP 185/83 | HR 64 | Ht 64.0 in | Wt 264.0 lb

## 2019-06-16 DIAGNOSIS — Z01419 Encounter for gynecological examination (general) (routine) without abnormal findings: Secondary | ICD-10-CM | POA: Diagnosis present

## 2019-06-16 DIAGNOSIS — Z1151 Encounter for screening for human papillomavirus (HPV): Secondary | ICD-10-CM | POA: Diagnosis not present

## 2019-06-16 NOTE — Progress Notes (Signed)
Subjective:     Samantha Pierce is a 49 y.o. female here for a routine exam.  Patient's last menstrual period was 05/26/2019 (exact date). [id number] Birth Control Method:  BTL Menstrual Calendar(currently): irregular  Current complaints: none.   Current acute medical issues:     Recent Gynecologic History Patient's last menstrual period was 05/26/2019 (exact date). Last Pap: 2010,  normal Last mammogram: 12/2017,  normal  Past Medical History:  Diagnosis Date  . Anemia   . Anxiety   . Arthritis    left arm  . Asystole ([place])    a. 05/2015: pt developed bradycardia->asystole during [place] nuclear stress test, s/p brief code. Cath with only 30% prox LCx. Her asystole during Lexiscan infusion was felt to represent an excessive pharmacologic response to the agent and likely superimposed vasovagal response.  . Bipolar disorder ([place])   . CKD (chronic kidney disease) stage 3, GFR 30-59 ml/min    stage 4, not on dialysis  . COPD (chronic obstructive pulmonary disease) ([place])   . Depression   . Diabetes mellitus with diabetic retinopathy    type 2  . Difficulty walking   . Dyspnea    with exertion  . Essential hypertension   . GERD (gastroesophageal reflux disease)   . History of blood transfusion   . History of blood transfusion    after c/s surgery  . History of stroke April 2016   Acute lacunar infarct of the left thalamus  . History of stroke   . Leg cramps   . Mild CAD    a. LHC 05/2015: 30% prox Cx, otherwise widely patent.  . Morbid obesity ([place])   . Neuropathy    both arms, feet  . PTSD (post-traumatic stress disorder)   . PTSD (post-traumatic stress disorder)   . Seizures ([place])    has had one 12/26/15 - due to high blood sugar  . Stroke [facility])    right arm weakness, decrease feeling in fingers right hand  . TIA (transient ischemic attack)     Past Surgical History:  Procedure Laterality Date  . AV FISTULA PLACEMENT Right 01/08/2017   Procedure: RIGHT ARM  BRACHIOCEPHALIC ARTERIOVENOUS (AV) FISTULA CREATION;  Surgeon: Waynetta Sandy, MD;  Location: [place];  Service: Vascular;  Laterality: Right;  . AV FISTULA PLACEMENT Left 11/20/2018   Procedure: Creation of LEFT ARM ARTERIOVENOUS (AV) FISTULA;  Surgeon: Waynetta Sandy, MD;  Location: [place];  Service: Vascular;  Laterality: Left;  . BASCILIC VEIN TRANSPOSITION Left 02/11/2019   Procedure: BASILIC VEIN TRANSPOSITION SECOND STAGE;  Surgeon: Waynetta Sandy, MD;  Location: [place];  Service: Vascular;  Laterality: Left;  . CARDIAC CATHETERIZATION N/A 06/01/2015   Procedure: Left Heart Cath and Coronary Angiography;  Surgeon: Belva Crome, MD; CFX 30%, no other dz, EF nl by echo  . CESAREAN SECTION     x2  . DILATION AND CURETTAGE OF UTERUS    . EYE SURGERY     Right eye pars plano vitrectomy   . HEMATOMA EVACUATION Left 03/03/2019   Procedure: EVACUATION HEMATOMA;  Surgeon: Elam Dutch, MD;  Location: [place];  Service: Vascular;  Laterality: Left;  . LASIK    . PARS PLANA VITRECTOMY  04/20/2011   Procedure: PARS PLANA VITRECTOMY WITH 25 GAUGE;  Surgeon: Hayden Pedro, MD;  Location: [place];  Service: Ophthalmology;  Laterality: Left;  membrane peel, gas fluid exchange, endolaser, repair of complex retinal detachment  . TUBAL LIGATION  OB History    Gravida  2   Para  2   Term  2   Preterm      AB      Living  2     SAB      TAB      Ectopic      Multiple      Live Births              Social History   Socioeconomic History  . Marital status: Single    Spouse name: Not on file  . Number of children: Not on file  . Years of education: Not on file  . Highest education level: Not on file  Occupational History  . Not on file  Tobacco Use  . Smoking status: Former Smoker    Packs/day: 0.03    Years: 30.00    Pack years: 0.90    Types: Cigarettes    Start date: 06/05/1981    Quit date: 12/04/2018    Years since quitting: 0.5  .  Smokeless tobacco: Never Used  Substance and Sexual Activity  . Alcohol use: Yes    Alcohol/week: 0.0 standard drinks    Comment: occ wine  . Drug use: No  . Sexual activity: Yes    Birth control/protection: Surgical  Other Topics Concern  . Not on file  Social History Narrative  . Not on file   Social Determinants of Health   Financial Resource Strain:   . Difficulty of Paying Living Expenses: Not on file  Food Insecurity:   . Worried About Charity fundraiser in the Last Year: Not on file  . Ran Out of Food in the Last Year: Not on file  Transportation Needs:   . Lack of Transportation (Medical): Not on file  . Lack of Transportation (Non-Medical): Not on file  Physical Activity:   . Days of Exercise per Week: Not on file  . Minutes of Exercise per Session: Not on file  Stress:   . Feeling of Stress : Not on file  Social Connections:   . Frequency of Communication with Friends and Family: Not on file  . Frequency of Social Gatherings with Friends and Family: Not on file  . Attends Religious Services: Not on file  . Active Member of Clubs or Organizations: Not on file  . Attends Archivist Meetings: Not on file  . Marital Status: Not on file    Family History  Problem Relation Age of Onset  . Diabetes Mother   . Kidney failure Mother   . Diabetes Father   . Amblyopia Neg Hx   . Blindness Neg Hx   . Cataracts Neg Hx   . Glaucoma Neg Hx   . Macular degeneration Neg Hx   . Retinal detachment Neg Hx   . Strabismus Neg Hx   . Retinitis pigmentosa Neg Hx      Current Outpatient Medications:  .  albuterol (PROVENTIL HFA;VENTOLIN HFA) 108 (90 BASE) MCG/ACT inhaler, Inhale 1-2 puffs into the lungs every 6 (six) hours as needed for wheezing or shortness of breath. (Patient taking differently: Inhale 1-2 puffs into the lungs every 6 (six) hours as needed for shortness of breath. ), Disp: 1 Inhaler, Rfl: 0 .  amLODipine (NORVASC) 10 MG tablet, Take 1 tablet (10  mg total) by mouth daily., Disp: 30 tablet, Rfl: 1 .  atorvastatin (LIPITOR) 80 MG tablet, Take 1 tablet (80 mg total) by mouth daily at 6 PM. (  Patient taking differently: Take 80 mg by mouth every morning. ), Disp: 30 tablet, Rfl: 6 .  budesonide-formoterol (SYMBICORT) 160-4.5 MCG/ACT inhaler, Inhale 2 puffs into the lungs every morning., Disp: , Rfl:  .  cloNIDine (CATAPRES) 0.2 MG tablet, Take 0.2 mg by mouth 2 (two) times daily. , Disp: , Rfl:  .  clopidogrel (PLAVIX) 75 MG tablet, Take 1 tablet (75 mg total) by mouth daily., Disp: 30 tablet, Rfl: 0 .  FLUoxetine (PROZAC) 20 MG capsule, Take 40 mg by mouth daily. , Disp: , Rfl:  .  furosemide (LASIX) 40 MG tablet, Take 80 mg by mouth daily. , Disp: , Rfl:  .  gabapentin (NEURONTIN) 300 MG capsule, Take 300 mg by mouth at bedtime. , Disp: , Rfl:  .  hydrALAZINE (APRESOLINE) 25 MG tablet, Take 1 tablet (25 mg total) by mouth 2 (two) times daily., Disp: 60 tablet, Rfl: 1 .  insulin aspart (NOVOLOG) 100 UNIT/ML FlexPen, Inject 10 Units into the skin 3 (three) times daily before meals. , Disp: , Rfl:  .  Insulin Detemir (LEVEMIR) 100 UNIT/ML Pen, Inject 50 Units into the skin at bedtime. (Patient taking differently: Inject 80 Units into the skin at bedtime. ), Disp: 15 mL, Rfl:  .  metoprolol succinate (TOPROL-XL) 50 MG 24 hr tablet, Take 50 mg by mouth daily., Disp: , Rfl:  .  omeprazole (PRILOSEC) 20 MG capsule, Take 20 mg by mouth daily before breakfast. , Disp: , Rfl:  .  tiotropium (SPIRIVA) 18 MCG inhalation capsule, Place 18 mcg into inhaler and inhale every morning. , Disp: , Rfl:  .  traZODone (DESYREL) 100 MG tablet, Take 100 mg by mouth at bedtime., Disp: , Rfl:  .  dorzolamide (TRUSOPT) 2 % ophthalmic solution, Place 1 drop into both eyes 2 (two) times daily as needed (eye pain.). , Disp: , Rfl:  .  HYDROcodone-acetaminophen (NORCO/VICODIN) 5-325 MG tablet, Take 1 tablet by mouth every 6 (six) hours as needed for moderate pain. (Patient  not taking: Reported on 06/16/2019), Disp: 12 tablet, Rfl: 0 .  sodium bicarbonate 650 MG tablet, Take 650 mg by mouth 2 (two) times daily. , Disp: , Rfl:   Review of Systems  Review of Systems  Constitutional: Negative for fever, chills, weight loss, malaise/fatigue and diaphoresis.  HENT: Negative for hearing loss, ear pain, nosebleeds, congestion, sore throat, neck pain, tinnitus and ear discharge.   Eyes: Negative for blurred vision, double vision, photophobia, pain, discharge and redness.  Respiratory: Negative for cough, hemoptysis, sputum production, shortness of breath, wheezing and stridor.   Cardiovascular: Negative for chest pain, palpitations, orthopnea, claudication, leg swelling and PND.  Gastrointestinal: negative for abdominal pain. Negative for heartburn, nausea, vomiting, diarrhea, constipation, blood in stool and melena.  Genitourinary: Negative for dysuria, urgency, frequency, hematuria and flank pain.  Musculoskeletal: Negative for myalgias, back pain, joint pain and falls.  Skin: Negative for itching and rash.  Neurological: Negative for dizziness, tingling, tremors, sensory change, speech change, focal weakness, seizures, loss of consciousness, weakness and headaches.  Endo/Heme/Allergies: Negative for environmental allergies and polydipsia. Does not bruise/bleed easily.  Psychiatric/Behavioral: Negative for depression, suicidal ideas, hallucinations, memory loss and substance abuse. The patient is not nervous/anxious and does not have insomnia.        Objective:  Blood pressure (!) 185/83, pulse 64, height 5\' 4"  (1.626 m), weight 264 lb (119.7 kg), last menstrual period 05/26/2019.   Physical Exam  Vitals reviewed. Constitutional: She is oriented to person, place, and  time. She appears well-developed and well-nourished.  HENT:  Head: Normocephalic and atraumatic.        Right Ear: External ear normal.  Left Ear: External ear normal.  Nose: Nose normal.   Mouth/Throat: Oropharynx is clear and moist.  Eyes: Conjunctivae and EOM are normal. Pupils are equal, round, and reactive to light. Right eye exhibits no discharge. Left eye exhibits no discharge. No scleral icterus.  Neck: Normal range of motion. Neck supple. No tracheal deviation present. No thyromegaly present.  Cardiovascular: Normal rate, regular rhythm, normal heart sounds and intact distal pulses.  Exam reveals no gallop and no friction rub.   No murmur heard. Respiratory: Effort normal and breath sounds normal. No respiratory distress. She has no wheezes. She has no rales. She exhibits no tenderness.  GI: Soft. Bowel sounds are normal. She exhibits no distension and no mass. There is no tenderness. There is no rebound and no guarding.  Genitourinary:  Breasts no masses skin changes or nipple changes bilaterally      Vulva is normal without lesions Vagina is pink moist without discharge Cervix normal in appearance and pap is done Uterus is normal size shape and contour Adnexa is negative with normal sized ovaries   Musculoskeletal: Normal range of motion. She exhibits no edema and no tenderness.  Neurological: She is alert and oriented to person, place, and time. She has normal reflexes. She displays normal reflexes. No cranial nerve deficit. She exhibits normal muscle tone. Coordination normal.  Skin: Skin is warm and dry. No rash noted. No erythema. No pallor.  Psychiatric: She has a normal mood and affect. Her behavior is normal. Judgment and thought content normal.       Medications Ordered at today's visit: No orders of the defined types were placed in this encounter.   Other orders placed at today's visit: No orders of the defined types were placed in this encounter.     Assessment:    Healthy female exam.    Plan:    Mammogram ordered. Follow up in: 1 year.     Return in about 1 year (around 06/15/2020) for yearly.

## 2019-06-18 LAB — CYTOLOGY - PAP
Adequacy: ABSENT
Chlamydia: NEGATIVE
Comment: NEGATIVE
Comment: NEGATIVE
Comment: NORMAL
Diagnosis: NEGATIVE
High risk HPV: NEGATIVE
Neisseria Gonorrhea: NEGATIVE

## 2019-06-23 ENCOUNTER — Telehealth: Payer: Self-pay | Admitting: *Deleted

## 2019-06-23 NOTE — Telephone Encounter (Signed)
Called pt to remind her of her appt on 06/24/2019.  Pt decided to cancel.  She said that she did not want to have a future procedure right now.  Notified PCP.

## 2019-06-24 ENCOUNTER — Ambulatory Visit: Payer: Medicare HMO | Admitting: Gastroenterology

## 2019-08-12 ENCOUNTER — Telehealth: Payer: Self-pay

## 2019-08-12 NOTE — Telephone Encounter (Signed)
Pt called and said that she is having a tiny bit of a sticking feeling when she has dialysis and that she may possibly have a stitch but she cannot tell. Advised her that given she is 6 months post surgery, that her kidney dr needs to manage the care at this time. If they feel that it is something that our drs need to manage - they will call and let us know.   York Cerise, CMA

## 2019-10-13 ENCOUNTER — Encounter: Payer: Self-pay | Admitting: Cardiology

## 2019-10-13 ENCOUNTER — Telehealth: Payer: Self-pay | Admitting: Cardiology

## 2019-10-13 NOTE — Telephone Encounter (Signed)
    Patient Consent for Virtual Visit         Samantha Pierce has provided verbal consent on 10/13/2019 for a virtual visit (video or telephone).   CONSENT FOR VIRTUAL VISIT FOR:  Samantha Pierce  By participating in this virtual visit I agree to the following:  I hereby voluntarily request, consent and authorize [facility] and its employed or contracted physicians, physician assistants, nurse practitioners or other licensed health care professionals (the Practitioner), to provide me with telemedicine health care services (the "Services") as deemed necessary by the treating Practitioner. I acknowledge and consent to receive the Services by the Practitioner via telemedicine. I understand that the telemedicine visit will involve communicating with the Practitioner through live audiovisual communication technology and the disclosure of certain medical information by electronic transmission. I acknowledge that I have been given the opportunity to request an in-person assessment or other available alternative prior to the telemedicine visit and am voluntarily participating in the telemedicine visit.  I understand that I have the right to withhold or withdraw my consent to the use of telemedicine in the course of my care at any time, without affecting my right to future care or treatment, and that the Practitioner or I may terminate the telemedicine visit at any time. I understand that I have the right to inspect all information obtained and/or recorded in the course of the telemedicine visit and may receive copies of available information for a reasonable fee.  I understand that some of the potential risks of receiving the Services via telemedicine include:  Marland Kitchen Delay or interruption in medical evaluation due to technological equipment failure or disruption; . Information transmitted may not be sufficient (e.g. poor resolution of images) to allow for appropriate medical decision making by the  Practitioner; and/or  . In rare instances, security protocols could fail, causing a breach of personal health information.  Furthermore, I acknowledge that it is my responsibility to provide information about my medical history, conditions and care that is complete and accurate to the best of my ability. I acknowledge that Practitioner's advice, recommendations, and/or decision may be based on factors not within their control, such as incomplete or inaccurate data provided by me or distortions of diagnostic images or specimens that may result from electronic transmissions. I understand that the practice of medicine is not an exact science and that Practitioner makes no warranties or guarantees regarding treatment outcomes. I acknowledge that a copy of this consent can be made available to me via my patient portal ([facility]), or I can request a printed copy by calling the office of [facility].    I understand that my insurance will be billed for this visit.   I have read or had this consent read to me. . I understand the contents of this consent, which adequately explains the benefits and risks of the Services being provided via telemedicine.  . I have been provided ample opportunity to ask questions regarding this consent and the Services and have had my questions answered to my satisfaction. . I give my informed consent for the services to be provided through the use of telemedicine in my medical care

## 2019-10-13 NOTE — Progress Notes (Signed)
Virtual Visit via Telephone Note   This visit type was conducted due to national recommendations for restrictions regarding the COVID-19 Pandemic (e.g. social distancing) in an effort to limit this patient's exposure and mitigate transmission in our community.  Due to her co-morbid illnesses, this patient is at least at moderate risk for complications without adequate follow up.  This format is felt to be most appropriate for this patient at this time.  The patient did not have access to video technology/had technical difficulties with video requiring transitioning to audio format only (telephone).  All issues noted in this document were discussed and addressed.  No physical exam could be performed with this format.  Please refer to the patient's chart for her  consent to telehealth for [facility].   The patient was identified using 2 identifiers.  Date:  10/14/2019   ID:  Samantha Pierce, DOB Aug 19, 1970, MRN [id number]  Patient Location: Home Provider Location: Office  PCP:  Rosita Fire, MD  Cardiologist:  Rozann Lesches, MD Electrophysiologist:  None   Evaluation Performed:  Follow-Up Visit  Chief Complaint:  Cardiac follow-up  History of Present Illness:    Samantha Pierce is a 49 y.o. female last assessed via telehealth encounter in April 2020 by Ms. Strader PA-C.  We spoke by phone today.  She tells me that she has been doing relatively well.  From a cardiac perspective no interval chest pain or change in stamina.  She has been recently focused on diet with plan for weight loss, would also like to start using her stationary bicycle again.  She is following with Dr. Theador Hawthorne, has not had to start hemodialysis as yet.  She does have an access in place.  I reviewed her medications which are outlined below.  She states that Plavix dropped off of her list in the interim - it looks like this may have been started by hospitalist in the past in the setting of prior stroke.  I have  asked her to clarify with her PCP whether she should continue or not, I would think that this would be a long-term medication.   Past Medical History:  Diagnosis Date  . Anemia   . Anxiety   . Arthritis   . Asystole ([place])    a. 05/2015: pt developed bradycardia->asystole during [place] nuclear stress test, s/p brief code. Cath with only 30% prox LCx. Her asystole during Lexiscan infusion was felt to represent an excessive pharmacologic response to the agent and likely superimposed vasovagal response.  . Bipolar disorder ([place])   . CKD (chronic kidney disease) stage 4, GFR 15-29 ml/min (HCC)   . COPD (chronic obstructive pulmonary disease) ([place])   . Depression   . Essential hypertension   . GERD (gastroesophageal reflux disease)   . History of blood transfusion   . History of seizure 2017  . History of stroke April 2016   Acute lacunar infarct of the left thalamus  . Leg cramps   . Mild CAD    a. LHC 05/2015: 30% prox Cx, otherwise widely patent.  . Morbid obesity ([place])   . Neuropathy   . PTSD (post-traumatic stress disorder)   . Type 2 diabetes mellitus ([place])    Past Surgical History:  Procedure Laterality Date  . AV FISTULA PLACEMENT Right 01/08/2017   Procedure: RIGHT ARM BRACHIOCEPHALIC ARTERIOVENOUS (AV) FISTULA CREATION;  Surgeon: Waynetta Sandy, MD;  Location: [place];  Service: Vascular;  Laterality: Right;  . AV FISTULA PLACEMENT Left 11/20/2018  Procedure: Creation of LEFT ARM ARTERIOVENOUS (AV) FISTULA;  Surgeon: Waynetta Sandy, MD;  Location: [place];  Service: Vascular;  Laterality: Left;  . BASCILIC VEIN TRANSPOSITION Left 02/11/2019   Procedure: BASILIC VEIN TRANSPOSITION SECOND STAGE;  Surgeon: Waynetta Sandy, MD;  Location: [place];  Service: Vascular;  Laterality: Left;  . CARDIAC CATHETERIZATION N/A 06/01/2015   Procedure: Left Heart Cath and Coronary Angiography;  Surgeon: Belva Crome, MD; CFX 30%, no other dz, EF nl by echo  .  CESAREAN SECTION     x2  . DILATION AND CURETTAGE OF UTERUS    . EYE SURGERY     Right eye pars plano vitrectomy   . HEMATOMA EVACUATION Left 03/03/2019   Procedure: EVACUATION HEMATOMA;  Surgeon: Elam Dutch, MD;  Location: [place];  Service: Vascular;  Laterality: Left;  . LASIK    . PARS PLANA VITRECTOMY  04/20/2011   Procedure: PARS PLANA VITRECTOMY WITH 25 GAUGE;  Surgeon: Hayden Pedro, MD;  Location: [place];  Service: Ophthalmology;  Laterality: Left;  membrane peel, gas fluid exchange, endolaser, repair of complex retinal detachment  . TUBAL LIGATION       Current Meds  Medication Sig  . albuterol (PROVENTIL HFA;VENTOLIN HFA) 108 (90 BASE) MCG/ACT inhaler Inhale 1-2 puffs into the lungs every 6 (six) hours as needed for wheezing or shortness of breath. (Patient taking differently: Inhale 1-2 puffs into the lungs every 6 (six) hours as needed for shortness of breath. )  . amLODipine (NORVASC) 10 MG tablet Take 1 tablet (10 mg total) by mouth daily.  Marland Kitchen atorvastatin (LIPITOR) 80 MG tablet Take 1 tablet (80 mg total) by mouth daily at 6 PM. (Patient taking differently: Take 80 mg by mouth every morning. )  . budesonide-formoterol (SYMBICORT) 160-4.5 MCG/ACT inhaler Inhale 2 puffs into the lungs every morning.  . cloNIDine (CATAPRES) 0.2 MG tablet Take 0.2 mg by mouth 2 (two) times daily.   . dorzolamide (TRUSOPT) 2 % ophthalmic solution Place 1 drop into both eyes 2 (two) times daily as needed (eye pain.).   Marland Kitchen FLUoxetine (PROZAC) 20 MG capsule Take 40 mg by mouth daily.   . furosemide (LASIX) 80 MG tablet Take 80 mg by mouth 2 (two) times daily.  Marland Kitchen gabapentin (NEURONTIN) 300 MG capsule Take 300 mg by mouth at bedtime.   . hydrALAZINE (APRESOLINE) 25 MG tablet Take 1 tablet (25 mg total) by mouth 2 (two) times daily.  . insulin aspart (NOVOLOG) 100 UNIT/ML FlexPen Inject 10 Units into the skin 3 (three) times daily before meals.   . Insulin Detemir (LEVEMIR) 100 UNIT/ML Pen Inject 50  Units into the skin at bedtime. (Patient taking differently: Inject 80 Units into the skin at bedtime. )  . metoprolol succinate (TOPROL-XL) 50 MG 24 hr tablet Take 50 mg by mouth daily.  Marland Kitchen omeprazole (PRILOSEC) 20 MG capsule Take 20 mg by mouth daily before breakfast.   . sodium bicarbonate 650 MG tablet Take 650 mg by mouth 2 (two) times daily.   Marland Kitchen tiotropium (SPIRIVA) 18 MCG inhalation capsule Place 18 mcg into inhaler and inhale every morning.   . traZODone (DESYREL) 100 MG tablet Take 100 mg by mouth at bedtime.  . [DISCONTINUED] furosemide (LASIX) 40 MG tablet Take 80 mg by mouth 2 (two) times daily.      Allergies:   Contrast media [iodinated diagnostic agents] and Penicillins   ROS:   No palpitations or syncope.  Prior CV studies:  The following studies were reviewed today:  Cardiac catheterization 06/01/2015: Prox Cx lesion, 30% stenosed.   The coronary arteries are widely patent with minimal plaque noted. 40 cc of contrast was used  Left ventricular end-diastolic pressure is mildly elevated. Left ventriculography was not performed in an attempt to minimize contrast exposure.  Asystole during adenosine infusion represents an excessive pharmacologic response to the agent and likely superimposed vasovagal response.  Echocardiogram 01/20/2019: 1. The left ventricle has normal systolic function with an ejection  fraction of 60-65%. The cavity size was normal. There is moderately  increased left ventricular wall thickness. Left ventricular diastolic  Doppler parameters are indeterminate.  2. The right ventricle has normal systolic function. The cavity was  normal. There is no increase in right ventricular wall thickness.  3. Left atrial size was severely dilated.  4. Right atrial size was mildly dilated.  5. No evidence of mitral valve stenosis.  6. The aortic valve is tricuspid. No stenosis of the aortic valve.  7. The aorta is normal unless otherwise noted.   8. There is dilatation of the aortic root.  9. Pulmonary hypertension is indeterminate, inadequate TR jet.  10. The inferior vena cava was dilated in size with >50% respiratory  variability.   Labs/Other Tests and Data Reviewed:    EKG:  An ECG dated 01/30/2019 was personally reviewed today and demonstrated:  Sinus rhythm with decreased R wave progression.  Recent Labs: 01/21/2019: Magnesium 2.1 01/30/2019: B Natriuretic Peptide 142.0 02/22/2019: ALT 13 03/02/2019: Platelets 312 03/03/2019: Hemoglobin 9.5 03/04/2019: BUN 47; Creatinine, Ser 4.72; Potassium 4.6; Sodium 126    Wt Readings from Last 3 Encounters:  10/14/19 260 lb (117.9 kg)  06/16/19 264 lb (119.7 kg)  04/15/19 262 lb (118.8 kg)     Objective:    Vital Signs:  Ht 5\' 4"  (1.626 m)   Wt 260 lb (117.9 kg)   BMI 44.63 kg/m    Unable to obtain vital signs today. Patient spoke in full sentences, not short of breath. No audible wheezing or coughing.  ASSESSMENT & PLAN:    1.  History of mild coronary atherosclerosis by cardiac catheterization in December 2016.  She does not report any active angina symptoms since last evaluation.  Medical therapy includes Lipitor, Norvasc, and Toprol-XL.  It looks like Plavix dropped off of her medication list (previously prescribed by hospital team following stroke), I have asked her to clarify this with her PCP.  She should at least be on an aspirin daily.  2.  CKD stage IV-V, continues to follow with Dr. Theador Hawthorne.  She has not had to start hemodialysis as yet.   Time:   Today, I have spent 5 minutes with the patient with telehealth technology discussing the above problems.     Medication Adjustments/Labs and Tests Ordered: Current medicines are reviewed at length with the patient today.  Concerns regarding medicines are outlined above.   Tests Ordered: No orders of the defined types were placed in this encounter.   Medication Changes: No orders of the defined types were  placed in this encounter.   Follow Up:  In Person 1 year in the [place] office.  Signed, Rozann Lesches, MD  10/14/2019 9:44 AM    [place]

## 2019-10-14 ENCOUNTER — Telehealth (INDEPENDENT_AMBULATORY_CARE_PROVIDER_SITE_OTHER): Payer: Medicare HMO | Admitting: Cardiology

## 2019-10-14 ENCOUNTER — Encounter: Payer: Self-pay | Admitting: Cardiology

## 2019-10-14 VITALS — Ht 64.0 in | Wt 260.0 lb

## 2019-10-14 DIAGNOSIS — I10 Essential (primary) hypertension: Secondary | ICD-10-CM

## 2019-10-14 DIAGNOSIS — I2510 Atherosclerotic heart disease of native coronary artery without angina pectoris: Secondary | ICD-10-CM

## 2019-10-14 DIAGNOSIS — N185 Chronic kidney disease, stage 5: Secondary | ICD-10-CM

## 2019-10-14 NOTE — Patient Instructions (Addendum)

## 2019-10-27 ENCOUNTER — Ambulatory Visit: Payer: Medicare HMO

## 2019-10-27 DIAGNOSIS — Z23 Encounter for immunization: Secondary | ICD-10-CM

## 2019-10-27 NOTE — Progress Notes (Signed)
   Covid-19 Vaccination Clinic  Name:  Samantha Pierce    MRN: [id number] DOB: Mar 23, 1971  10/27/2019  [name] was observed post Covid-19 immunization for 30 minutes based on pre-vaccination screening without incident. She was provided with Vaccine Information Sheet and instruction to access the V-Safe system.   [name] was instructed to call 911 with any severe reactions post vaccine: Marland Kitchen Difficulty breathing  . Swelling of face and throat  . A fast heartbeat  . A bad rash all over body  . Dizziness and weakness   Immunizations Administered    Name Date Dose VIS Date Route   Moderna COVID-19 Vaccine 10/27/2019  1:12 PM 0.5 mL 05/2019 Intramuscular   Manufacturer: Moderna   Lot: [id number]   [place]: 24497-530-05

## 2019-11-13 DIAGNOSIS — N184 Chronic kidney disease, stage 4 (severe): Secondary | ICD-10-CM | POA: Diagnosis not present

## 2019-11-13 DIAGNOSIS — E113591 Type 2 diabetes mellitus with proliferative diabetic retinopathy without macular edema, right eye: Secondary | ICD-10-CM | POA: Diagnosis not present

## 2019-11-13 DIAGNOSIS — J449 Chronic obstructive pulmonary disease, unspecified: Secondary | ICD-10-CM | POA: Diagnosis not present

## 2019-11-17 DIAGNOSIS — F319 Bipolar disorder, unspecified: Secondary | ICD-10-CM | POA: Diagnosis not present

## 2019-11-17 DIAGNOSIS — Z91410 Personal history of adult physical and sexual abuse: Secondary | ICD-10-CM | POA: Diagnosis not present

## 2019-11-17 DIAGNOSIS — Z62810 Personal history of physical and sexual abuse in childhood: Secondary | ICD-10-CM | POA: Diagnosis not present

## 2019-11-17 DIAGNOSIS — Z720 Tobacco use: Secondary | ICD-10-CM | POA: Diagnosis not present

## 2019-11-17 DIAGNOSIS — Z91411 Personal history of adult psychological abuse: Secondary | ICD-10-CM | POA: Diagnosis not present

## 2019-11-17 DIAGNOSIS — F331 Major depressive disorder, recurrent, moderate: Secondary | ICD-10-CM | POA: Diagnosis not present

## 2019-11-17 DIAGNOSIS — Z6229 Other upbringing away from parents: Secondary | ICD-10-CM | POA: Diagnosis not present

## 2019-11-17 DIAGNOSIS — F411 Generalized anxiety disorder: Secondary | ICD-10-CM | POA: Diagnosis not present

## 2019-11-17 DIAGNOSIS — R69 Illness, unspecified: Secondary | ICD-10-CM | POA: Diagnosis not present

## 2019-11-17 DIAGNOSIS — Z915 Personal history of self-harm: Secondary | ICD-10-CM | POA: Diagnosis not present

## 2019-11-24 ENCOUNTER — Ambulatory Visit: Payer: Medicare HMO

## 2019-11-24 DIAGNOSIS — Z23 Encounter for immunization: Secondary | ICD-10-CM

## 2019-11-24 NOTE — Progress Notes (Signed)
   Covid-19 Vaccination Clinic  Name:  Samantha Pierce    MRN: [id number] DOB: 1970/09/22  11/24/2019  [name] was observed post Covid-19 immunization for 30 minutes based on pre-vaccination screening without incident. She was provided with Vaccine Information Sheet and instruction to access the V-Safe system.   [name] was instructed to call 911 with any severe reactions post vaccine: Marland Kitchen Difficulty breathing  . Swelling of face and throat  . A fast heartbeat  . A bad rash all over body  . Dizziness and weakness   Immunizations Administered    Name Date Dose VIS Date Route   Moderna COVID-19 Vaccine 11/24/2019  1:14 PM 0.5 mL 05/2019 Intramuscular   Manufacturer: Moderna   Lot: [id number]   [place]: 47096-283-66

## 2019-12-01 DIAGNOSIS — Z915 Personal history of self-harm: Secondary | ICD-10-CM | POA: Diagnosis not present

## 2019-12-01 DIAGNOSIS — Z6229 Other upbringing away from parents: Secondary | ICD-10-CM | POA: Diagnosis not present

## 2019-12-01 DIAGNOSIS — F319 Bipolar disorder, unspecified: Secondary | ICD-10-CM | POA: Diagnosis not present

## 2019-12-01 DIAGNOSIS — Z91410 Personal history of adult physical and sexual abuse: Secondary | ICD-10-CM | POA: Diagnosis not present

## 2019-12-01 DIAGNOSIS — R69 Illness, unspecified: Secondary | ICD-10-CM | POA: Diagnosis not present

## 2019-12-01 DIAGNOSIS — Z720 Tobacco use: Secondary | ICD-10-CM | POA: Diagnosis not present

## 2019-12-01 DIAGNOSIS — F411 Generalized anxiety disorder: Secondary | ICD-10-CM | POA: Diagnosis not present

## 2019-12-01 DIAGNOSIS — F331 Major depressive disorder, recurrent, moderate: Secondary | ICD-10-CM | POA: Diagnosis not present

## 2019-12-01 DIAGNOSIS — Z62810 Personal history of physical and sexual abuse in childhood: Secondary | ICD-10-CM | POA: Diagnosis not present

## 2019-12-01 DIAGNOSIS — Z91411 Personal history of adult psychological abuse: Secondary | ICD-10-CM | POA: Diagnosis not present

## 2019-12-13 DIAGNOSIS — E113591 Type 2 diabetes mellitus with proliferative diabetic retinopathy without macular edema, right eye: Secondary | ICD-10-CM | POA: Diagnosis not present

## 2019-12-13 DIAGNOSIS — E785 Hyperlipidemia, unspecified: Secondary | ICD-10-CM | POA: Diagnosis not present

## 2019-12-15 DIAGNOSIS — Z91411 Personal history of adult psychological abuse: Secondary | ICD-10-CM | POA: Diagnosis not present

## 2019-12-15 DIAGNOSIS — Z720 Tobacco use: Secondary | ICD-10-CM | POA: Diagnosis not present

## 2019-12-15 DIAGNOSIS — Z62810 Personal history of physical and sexual abuse in childhood: Secondary | ICD-10-CM | POA: Diagnosis not present

## 2019-12-15 DIAGNOSIS — Z6229 Other upbringing away from parents: Secondary | ICD-10-CM | POA: Diagnosis not present

## 2019-12-15 DIAGNOSIS — Z91410 Personal history of adult physical and sexual abuse: Secondary | ICD-10-CM | POA: Diagnosis not present

## 2019-12-15 DIAGNOSIS — F319 Bipolar disorder, unspecified: Secondary | ICD-10-CM | POA: Diagnosis not present

## 2019-12-15 DIAGNOSIS — F331 Major depressive disorder, recurrent, moderate: Secondary | ICD-10-CM | POA: Diagnosis not present

## 2019-12-15 DIAGNOSIS — R69 Illness, unspecified: Secondary | ICD-10-CM | POA: Diagnosis not present

## 2019-12-15 DIAGNOSIS — Z915 Personal history of self-harm: Secondary | ICD-10-CM | POA: Diagnosis not present

## 2019-12-15 DIAGNOSIS — F411 Generalized anxiety disorder: Secondary | ICD-10-CM | POA: Diagnosis not present

## 2019-12-30 DIAGNOSIS — F319 Bipolar disorder, unspecified: Secondary | ICD-10-CM | POA: Diagnosis not present

## 2019-12-30 DIAGNOSIS — Z915 Personal history of self-harm: Secondary | ICD-10-CM | POA: Diagnosis not present

## 2019-12-30 DIAGNOSIS — Z91410 Personal history of adult physical and sexual abuse: Secondary | ICD-10-CM | POA: Diagnosis not present

## 2019-12-30 DIAGNOSIS — Z6229 Other upbringing away from parents: Secondary | ICD-10-CM | POA: Diagnosis not present

## 2019-12-30 DIAGNOSIS — Z720 Tobacco use: Secondary | ICD-10-CM | POA: Diagnosis not present

## 2019-12-30 DIAGNOSIS — Z91411 Personal history of adult psychological abuse: Secondary | ICD-10-CM | POA: Diagnosis not present

## 2019-12-30 DIAGNOSIS — F411 Generalized anxiety disorder: Secondary | ICD-10-CM | POA: Diagnosis not present

## 2019-12-30 DIAGNOSIS — F331 Major depressive disorder, recurrent, moderate: Secondary | ICD-10-CM | POA: Diagnosis not present

## 2019-12-30 DIAGNOSIS — R69 Illness, unspecified: Secondary | ICD-10-CM | POA: Diagnosis not present

## 2019-12-30 DIAGNOSIS — Z62810 Personal history of physical and sexual abuse in childhood: Secondary | ICD-10-CM | POA: Diagnosis not present

## 2020-01-05 DIAGNOSIS — N184 Chronic kidney disease, stage 4 (severe): Secondary | ICD-10-CM | POA: Diagnosis not present

## 2020-01-05 DIAGNOSIS — R809 Proteinuria, unspecified: Secondary | ICD-10-CM | POA: Diagnosis not present

## 2020-01-05 DIAGNOSIS — Z79899 Other long term (current) drug therapy: Secondary | ICD-10-CM | POA: Diagnosis not present

## 2020-01-05 DIAGNOSIS — D631 Anemia in chronic kidney disease: Secondary | ICD-10-CM | POA: Diagnosis not present

## 2020-01-12 DIAGNOSIS — F4310 Post-traumatic stress disorder, unspecified: Secondary | ICD-10-CM | POA: Diagnosis not present

## 2020-01-12 DIAGNOSIS — F411 Generalized anxiety disorder: Secondary | ICD-10-CM | POA: Diagnosis not present

## 2020-01-12 DIAGNOSIS — R69 Illness, unspecified: Secondary | ICD-10-CM | POA: Diagnosis not present

## 2020-01-13 DIAGNOSIS — E113591 Type 2 diabetes mellitus with proliferative diabetic retinopathy without macular edema, right eye: Secondary | ICD-10-CM | POA: Diagnosis not present

## 2020-01-13 DIAGNOSIS — I10 Essential (primary) hypertension: Secondary | ICD-10-CM | POA: Diagnosis not present

## 2020-01-15 DIAGNOSIS — Z6229 Other upbringing away from parents: Secondary | ICD-10-CM | POA: Diagnosis not present

## 2020-01-15 DIAGNOSIS — R69 Illness, unspecified: Secondary | ICD-10-CM | POA: Diagnosis not present

## 2020-01-15 DIAGNOSIS — Z62810 Personal history of physical and sexual abuse in childhood: Secondary | ICD-10-CM | POA: Diagnosis not present

## 2020-01-15 DIAGNOSIS — Z91411 Personal history of adult psychological abuse: Secondary | ICD-10-CM | POA: Diagnosis not present

## 2020-01-15 DIAGNOSIS — Z720 Tobacco use: Secondary | ICD-10-CM | POA: Diagnosis not present

## 2020-01-15 DIAGNOSIS — Z915 Personal history of self-harm: Secondary | ICD-10-CM | POA: Diagnosis not present

## 2020-01-15 DIAGNOSIS — F411 Generalized anxiety disorder: Secondary | ICD-10-CM | POA: Diagnosis not present

## 2020-01-15 DIAGNOSIS — Z91410 Personal history of adult physical and sexual abuse: Secondary | ICD-10-CM | POA: Diagnosis not present

## 2020-01-15 DIAGNOSIS — F331 Major depressive disorder, recurrent, moderate: Secondary | ICD-10-CM | POA: Diagnosis not present

## 2020-01-15 DIAGNOSIS — F319 Bipolar disorder, unspecified: Secondary | ICD-10-CM | POA: Diagnosis not present

## 2020-01-19 DIAGNOSIS — I129 Hypertensive chronic kidney disease with stage 1 through stage 4 chronic kidney disease, or unspecified chronic kidney disease: Secondary | ICD-10-CM | POA: Diagnosis not present

## 2020-01-19 DIAGNOSIS — E211 Secondary hyperparathyroidism, not elsewhere classified: Secondary | ICD-10-CM | POA: Diagnosis not present

## 2020-01-19 DIAGNOSIS — E1122 Type 2 diabetes mellitus with diabetic chronic kidney disease: Secondary | ICD-10-CM | POA: Diagnosis not present

## 2020-01-19 DIAGNOSIS — N185 Chronic kidney disease, stage 5: Secondary | ICD-10-CM | POA: Diagnosis not present

## 2020-01-19 DIAGNOSIS — E559 Vitamin D deficiency, unspecified: Secondary | ICD-10-CM | POA: Diagnosis not present

## 2020-01-19 DIAGNOSIS — R809 Proteinuria, unspecified: Secondary | ICD-10-CM | POA: Diagnosis not present

## 2020-01-19 DIAGNOSIS — E1129 Type 2 diabetes mellitus with other diabetic kidney complication: Secondary | ICD-10-CM | POA: Diagnosis not present

## 2020-01-29 DIAGNOSIS — F331 Major depressive disorder, recurrent, moderate: Secondary | ICD-10-CM | POA: Diagnosis not present

## 2020-01-29 DIAGNOSIS — Z62810 Personal history of physical and sexual abuse in childhood: Secondary | ICD-10-CM | POA: Diagnosis not present

## 2020-01-29 DIAGNOSIS — F319 Bipolar disorder, unspecified: Secondary | ICD-10-CM | POA: Diagnosis not present

## 2020-01-29 DIAGNOSIS — Z915 Personal history of self-harm: Secondary | ICD-10-CM | POA: Diagnosis not present

## 2020-01-29 DIAGNOSIS — R69 Illness, unspecified: Secondary | ICD-10-CM | POA: Diagnosis not present

## 2020-01-29 DIAGNOSIS — Z91411 Personal history of adult psychological abuse: Secondary | ICD-10-CM | POA: Diagnosis not present

## 2020-01-29 DIAGNOSIS — Z720 Tobacco use: Secondary | ICD-10-CM | POA: Diagnosis not present

## 2020-01-29 DIAGNOSIS — F411 Generalized anxiety disorder: Secondary | ICD-10-CM | POA: Diagnosis not present

## 2020-01-29 DIAGNOSIS — Z91410 Personal history of adult physical and sexual abuse: Secondary | ICD-10-CM | POA: Diagnosis not present

## 2020-01-29 DIAGNOSIS — Z6229 Other upbringing away from parents: Secondary | ICD-10-CM | POA: Diagnosis not present

## 2020-02-12 DIAGNOSIS — Z62810 Personal history of physical and sexual abuse in childhood: Secondary | ICD-10-CM | POA: Diagnosis not present

## 2020-02-12 DIAGNOSIS — Z915 Personal history of self-harm: Secondary | ICD-10-CM | POA: Diagnosis not present

## 2020-02-12 DIAGNOSIS — Z720 Tobacco use: Secondary | ICD-10-CM | POA: Diagnosis not present

## 2020-02-12 DIAGNOSIS — F319 Bipolar disorder, unspecified: Secondary | ICD-10-CM | POA: Diagnosis not present

## 2020-02-12 DIAGNOSIS — F411 Generalized anxiety disorder: Secondary | ICD-10-CM | POA: Diagnosis not present

## 2020-02-12 DIAGNOSIS — Z6229 Other upbringing away from parents: Secondary | ICD-10-CM | POA: Diagnosis not present

## 2020-02-12 DIAGNOSIS — Z91410 Personal history of adult physical and sexual abuse: Secondary | ICD-10-CM | POA: Diagnosis not present

## 2020-02-12 DIAGNOSIS — F331 Major depressive disorder, recurrent, moderate: Secondary | ICD-10-CM | POA: Diagnosis not present

## 2020-02-12 DIAGNOSIS — R69 Illness, unspecified: Secondary | ICD-10-CM | POA: Diagnosis not present

## 2020-02-26 DIAGNOSIS — Z62810 Personal history of physical and sexual abuse in childhood: Secondary | ICD-10-CM | POA: Diagnosis not present

## 2020-02-26 DIAGNOSIS — F319 Bipolar disorder, unspecified: Secondary | ICD-10-CM | POA: Diagnosis not present

## 2020-02-26 DIAGNOSIS — R69 Illness, unspecified: Secondary | ICD-10-CM | POA: Diagnosis not present

## 2020-02-26 DIAGNOSIS — Z91410 Personal history of adult physical and sexual abuse: Secondary | ICD-10-CM | POA: Diagnosis not present

## 2020-02-26 DIAGNOSIS — F411 Generalized anxiety disorder: Secondary | ICD-10-CM | POA: Diagnosis not present

## 2020-02-26 DIAGNOSIS — Z6229 Other upbringing away from parents: Secondary | ICD-10-CM | POA: Diagnosis not present

## 2020-02-26 DIAGNOSIS — F331 Major depressive disorder, recurrent, moderate: Secondary | ICD-10-CM | POA: Diagnosis not present

## 2020-02-26 DIAGNOSIS — Z915 Personal history of self-harm: Secondary | ICD-10-CM | POA: Diagnosis not present

## 2020-02-26 DIAGNOSIS — Z720 Tobacco use: Secondary | ICD-10-CM | POA: Diagnosis not present

## 2020-02-26 DIAGNOSIS — Z91411 Personal history of adult psychological abuse: Secondary | ICD-10-CM | POA: Diagnosis not present

## 2020-03-02 DIAGNOSIS — N184 Chronic kidney disease, stage 4 (severe): Secondary | ICD-10-CM | POA: Diagnosis not present

## 2020-03-02 DIAGNOSIS — I10 Essential (primary) hypertension: Secondary | ICD-10-CM | POA: Diagnosis not present

## 2020-03-02 DIAGNOSIS — J449 Chronic obstructive pulmonary disease, unspecified: Secondary | ICD-10-CM | POA: Diagnosis not present

## 2020-03-02 DIAGNOSIS — E113591 Type 2 diabetes mellitus with proliferative diabetic retinopathy without macular edema, right eye: Secondary | ICD-10-CM | POA: Diagnosis not present

## 2020-03-18 DIAGNOSIS — F331 Major depressive disorder, recurrent, moderate: Secondary | ICD-10-CM | POA: Diagnosis not present

## 2020-03-18 DIAGNOSIS — Z6229 Other upbringing away from parents: Secondary | ICD-10-CM | POA: Diagnosis not present

## 2020-03-18 DIAGNOSIS — R69 Illness, unspecified: Secondary | ICD-10-CM | POA: Diagnosis not present

## 2020-03-18 DIAGNOSIS — F319 Bipolar disorder, unspecified: Secondary | ICD-10-CM | POA: Diagnosis not present

## 2020-03-18 DIAGNOSIS — Z720 Tobacco use: Secondary | ICD-10-CM | POA: Diagnosis not present

## 2020-03-18 DIAGNOSIS — F411 Generalized anxiety disorder: Secondary | ICD-10-CM | POA: Diagnosis not present

## 2020-03-19 DIAGNOSIS — E1122 Type 2 diabetes mellitus with diabetic chronic kidney disease: Secondary | ICD-10-CM | POA: Diagnosis not present

## 2020-03-19 DIAGNOSIS — R809 Proteinuria, unspecified: Secondary | ICD-10-CM | POA: Diagnosis not present

## 2020-03-19 DIAGNOSIS — E211 Secondary hyperparathyroidism, not elsewhere classified: Secondary | ICD-10-CM | POA: Diagnosis not present

## 2020-03-19 DIAGNOSIS — E1129 Type 2 diabetes mellitus with other diabetic kidney complication: Secondary | ICD-10-CM | POA: Diagnosis not present

## 2020-03-19 DIAGNOSIS — N185 Chronic kidney disease, stage 5: Secondary | ICD-10-CM | POA: Diagnosis not present

## 2020-03-19 DIAGNOSIS — I129 Hypertensive chronic kidney disease with stage 1 through stage 4 chronic kidney disease, or unspecified chronic kidney disease: Secondary | ICD-10-CM | POA: Diagnosis not present

## 2020-03-19 DIAGNOSIS — E559 Vitamin D deficiency, unspecified: Secondary | ICD-10-CM | POA: Diagnosis not present

## 2020-03-24 DIAGNOSIS — R809 Proteinuria, unspecified: Secondary | ICD-10-CM | POA: Diagnosis not present

## 2020-03-24 DIAGNOSIS — I129 Hypertensive chronic kidney disease with stage 1 through stage 4 chronic kidney disease, or unspecified chronic kidney disease: Secondary | ICD-10-CM | POA: Diagnosis not present

## 2020-03-24 DIAGNOSIS — E1122 Type 2 diabetes mellitus with diabetic chronic kidney disease: Secondary | ICD-10-CM | POA: Diagnosis not present

## 2020-03-24 DIAGNOSIS — E559 Vitamin D deficiency, unspecified: Secondary | ICD-10-CM | POA: Diagnosis not present

## 2020-03-24 DIAGNOSIS — R600 Localized edema: Secondary | ICD-10-CM | POA: Diagnosis not present

## 2020-03-24 DIAGNOSIS — E872 Acidosis: Secondary | ICD-10-CM | POA: Diagnosis not present

## 2020-03-24 DIAGNOSIS — N185 Chronic kidney disease, stage 5: Secondary | ICD-10-CM | POA: Diagnosis not present

## 2020-03-24 DIAGNOSIS — E211 Secondary hyperparathyroidism, not elsewhere classified: Secondary | ICD-10-CM | POA: Diagnosis not present

## 2020-03-24 DIAGNOSIS — E1129 Type 2 diabetes mellitus with other diabetic kidney complication: Secondary | ICD-10-CM | POA: Diagnosis not present

## 2020-03-24 DIAGNOSIS — E871 Hypo-osmolality and hyponatremia: Secondary | ICD-10-CM | POA: Diagnosis not present

## 2020-03-30 DIAGNOSIS — R69 Illness, unspecified: Secondary | ICD-10-CM | POA: Diagnosis not present

## 2020-03-30 DIAGNOSIS — F411 Generalized anxiety disorder: Secondary | ICD-10-CM | POA: Diagnosis not present

## 2020-04-01 DIAGNOSIS — E113591 Type 2 diabetes mellitus with proliferative diabetic retinopathy without macular edema, right eye: Secondary | ICD-10-CM | POA: Diagnosis not present

## 2020-04-01 DIAGNOSIS — N184 Chronic kidney disease, stage 4 (severe): Secondary | ICD-10-CM | POA: Diagnosis not present

## 2020-04-08 DIAGNOSIS — Z23 Encounter for immunization: Secondary | ICD-10-CM | POA: Diagnosis not present

## 2020-04-13 DIAGNOSIS — Z20822 Contact with and (suspected) exposure to covid-19: Secondary | ICD-10-CM | POA: Diagnosis not present

## 2020-04-16 ENCOUNTER — Ambulatory Visit: Payer: Medicare HMO | Admitting: Vascular Surgery

## 2020-04-22 ENCOUNTER — Other Ambulatory Visit (HOSPITAL_COMMUNITY): Payer: Self-pay | Admitting: Psychiatry

## 2020-05-02 DIAGNOSIS — I10 Essential (primary) hypertension: Secondary | ICD-10-CM | POA: Diagnosis not present

## 2020-05-02 DIAGNOSIS — E113591 Type 2 diabetes mellitus with proliferative diabetic retinopathy without macular edema, right eye: Secondary | ICD-10-CM | POA: Diagnosis not present

## 2020-05-07 DIAGNOSIS — I10 Essential (primary) hypertension: Secondary | ICD-10-CM | POA: Diagnosis not present

## 2020-05-07 DIAGNOSIS — E113599 Type 2 diabetes mellitus with proliferative diabetic retinopathy without macular edema, unspecified eye: Secondary | ICD-10-CM | POA: Diagnosis not present

## 2020-05-08 LAB — HEMOGLOBIN A1C: Hemoglobin A1C: 12.4

## 2020-05-19 DIAGNOSIS — H401133 Primary open-angle glaucoma, bilateral, severe stage: Secondary | ICD-10-CM | POA: Diagnosis not present

## 2020-05-19 DIAGNOSIS — E113512 Type 2 diabetes mellitus with proliferative diabetic retinopathy with macular edema, left eye: Secondary | ICD-10-CM | POA: Diagnosis not present

## 2020-05-19 DIAGNOSIS — H25013 Cortical age-related cataract, bilateral: Secondary | ICD-10-CM | POA: Diagnosis not present

## 2020-05-20 DIAGNOSIS — N185 Chronic kidney disease, stage 5: Secondary | ICD-10-CM | POA: Diagnosis not present

## 2020-05-20 DIAGNOSIS — E1122 Type 2 diabetes mellitus with diabetic chronic kidney disease: Secondary | ICD-10-CM | POA: Diagnosis not present

## 2020-05-20 DIAGNOSIS — Z1389 Encounter for screening for other disorder: Secondary | ICD-10-CM | POA: Diagnosis not present

## 2020-05-20 DIAGNOSIS — R600 Localized edema: Secondary | ICD-10-CM | POA: Diagnosis not present

## 2020-05-20 DIAGNOSIS — E872 Acidosis: Secondary | ICD-10-CM | POA: Diagnosis not present

## 2020-05-20 DIAGNOSIS — E113591 Type 2 diabetes mellitus with proliferative diabetic retinopathy without macular edema, right eye: Secondary | ICD-10-CM | POA: Diagnosis not present

## 2020-05-20 DIAGNOSIS — E871 Hypo-osmolality and hyponatremia: Secondary | ICD-10-CM | POA: Diagnosis not present

## 2020-05-20 DIAGNOSIS — N184 Chronic kidney disease, stage 4 (severe): Secondary | ICD-10-CM | POA: Diagnosis not present

## 2020-05-20 DIAGNOSIS — I10 Essential (primary) hypertension: Secondary | ICD-10-CM | POA: Diagnosis not present

## 2020-05-20 DIAGNOSIS — Z1331 Encounter for screening for depression: Secondary | ICD-10-CM | POA: Diagnosis not present

## 2020-05-20 DIAGNOSIS — I129 Hypertensive chronic kidney disease with stage 1 through stage 4 chronic kidney disease, or unspecified chronic kidney disease: Secondary | ICD-10-CM | POA: Diagnosis not present

## 2020-05-20 DIAGNOSIS — R809 Proteinuria, unspecified: Secondary | ICD-10-CM | POA: Diagnosis not present

## 2020-05-20 DIAGNOSIS — E211 Secondary hyperparathyroidism, not elsewhere classified: Secondary | ICD-10-CM | POA: Diagnosis not present

## 2020-05-20 DIAGNOSIS — E1129 Type 2 diabetes mellitus with other diabetic kidney complication: Secondary | ICD-10-CM | POA: Diagnosis not present

## 2020-05-20 DIAGNOSIS — Z0001 Encounter for general adult medical examination with abnormal findings: Secondary | ICD-10-CM | POA: Diagnosis not present

## 2020-05-24 DIAGNOSIS — E211 Secondary hyperparathyroidism, not elsewhere classified: Secondary | ICD-10-CM | POA: Diagnosis not present

## 2020-05-24 DIAGNOSIS — E559 Vitamin D deficiency, unspecified: Secondary | ICD-10-CM | POA: Diagnosis not present

## 2020-05-24 DIAGNOSIS — R809 Proteinuria, unspecified: Secondary | ICD-10-CM | POA: Diagnosis not present

## 2020-05-24 DIAGNOSIS — E871 Hypo-osmolality and hyponatremia: Secondary | ICD-10-CM | POA: Diagnosis not present

## 2020-05-24 DIAGNOSIS — N185 Chronic kidney disease, stage 5: Secondary | ICD-10-CM | POA: Diagnosis not present

## 2020-05-24 DIAGNOSIS — E1122 Type 2 diabetes mellitus with diabetic chronic kidney disease: Secondary | ICD-10-CM | POA: Diagnosis not present

## 2020-05-24 DIAGNOSIS — E1129 Type 2 diabetes mellitus with other diabetic kidney complication: Secondary | ICD-10-CM | POA: Diagnosis not present

## 2020-05-24 DIAGNOSIS — I129 Hypertensive chronic kidney disease with stage 1 through stage 4 chronic kidney disease, or unspecified chronic kidney disease: Secondary | ICD-10-CM | POA: Diagnosis not present

## 2020-05-24 DIAGNOSIS — E872 Acidosis: Secondary | ICD-10-CM | POA: Diagnosis not present

## 2020-05-25 DIAGNOSIS — F411 Generalized anxiety disorder: Secondary | ICD-10-CM | POA: Diagnosis not present

## 2020-05-25 DIAGNOSIS — R69 Illness, unspecified: Secondary | ICD-10-CM | POA: Diagnosis not present

## 2020-06-15 ENCOUNTER — Encounter: Payer: Self-pay | Admitting: Nurse Practitioner

## 2020-06-15 ENCOUNTER — Other Ambulatory Visit: Payer: Self-pay

## 2020-06-15 ENCOUNTER — Ambulatory Visit (INDEPENDENT_AMBULATORY_CARE_PROVIDER_SITE_OTHER): Payer: Medicare HMO | Admitting: Nurse Practitioner

## 2020-06-15 VITALS — BP 163/82 | HR 63 | Ht 64.0 in | Wt 259.4 lb

## 2020-06-15 DIAGNOSIS — N184 Chronic kidney disease, stage 4 (severe): Secondary | ICD-10-CM

## 2020-06-15 DIAGNOSIS — E1122 Type 2 diabetes mellitus with diabetic chronic kidney disease: Secondary | ICD-10-CM

## 2020-06-15 DIAGNOSIS — IMO0002 Reserved for concepts with insufficient information to code with codable children: Secondary | ICD-10-CM

## 2020-06-15 DIAGNOSIS — E1165 Type 2 diabetes mellitus with hyperglycemia: Secondary | ICD-10-CM

## 2020-06-15 DIAGNOSIS — I10 Essential (primary) hypertension: Secondary | ICD-10-CM

## 2020-06-15 DIAGNOSIS — E782 Mixed hyperlipidemia: Secondary | ICD-10-CM

## 2020-06-15 NOTE — Patient Instructions (Signed)
Diabetes Mellitus and Nutrition, Adult When you have diabetes, or diabetes mellitus, it is very important to have healthy eating habits because your blood sugar (glucose) levels are greatly affected by what you eat and drink. Eating healthy foods in the right amounts, at about the same times every day, can help you:  Control your blood glucose.  Lower your risk of heart disease.  Improve your blood pressure.  Reach or maintain a healthy weight. What can affect my meal plan? Every person with diabetes is different, and each person has different needs for a meal plan. Your health care provider may recommend that you work with a dietitian to make a meal plan that is best for you. Your meal plan may vary depending on factors such as:  The calories you need.  The medicines you take.  Your weight.  Your blood glucose, blood pressure, and cholesterol levels.  Your activity level.  Other health conditions you have, such as heart or kidney disease. How do carbohydrates affect me? Carbohydrates, also called carbs, affect your blood glucose level more than any other type of food. Eating carbs naturally raises the amount of glucose in your blood. Carb counting is a method for keeping track of how many carbs you eat. Counting carbs is important to keep your blood glucose at a healthy level, especially if you use insulin or take certain oral diabetes medicines. It is important to know how many carbs you can safely have in each meal. This is different for every person. Your dietitian can help you calculate how many carbs you should have at each meal and for each snack. How does alcohol affect me? Alcohol can cause a sudden decrease in blood glucose (hypoglycemia), especially if you use insulin or take certain oral diabetes medicines. Hypoglycemia can be a life-threatening condition. Symptoms of hypoglycemia, such as sleepiness, dizziness, and confusion, are similar to symptoms of having too much  alcohol.  Do not drink alcohol if: ? Your health care provider tells you not to drink. ? You are pregnant, may be pregnant, or are planning to become pregnant.  If you drink alcohol: ? Do not drink on an empty stomach. ? Limit how much you use to:  0-1 drink a day for women.  0-2 drinks a day for men. ? Be aware of how much alcohol is in your drink. In the [place] one drink equals one 12 oz bottle of beer (355 mL), one 5 oz glass of wine (148 mL), or one 1 oz glass of hard liquor (44 mL). ? Keep yourself hydrated with water, diet soda, or unsweetened iced tea.  Keep in mind that regular soda, juice, and other mixers may contain a lot of sugar and must be counted as carbs. What are tips for following this plan? Reading food labels  Start by checking the serving size on the "Nutrition Facts" label of packaged foods and drinks. The amount of calories, carbs, fats, and other nutrients listed on the label is based on one serving of the item. Many items contain more than one serving per package.  Check the total grams (g) of carbs in one serving. You can calculate the number of servings of carbs in one serving by dividing the total carbs by 15. For example, if a food has 30 g of total carbs per serving, it would be equal to 2 servings of carbs.  Check the number of grams (g) of saturated fats and trans fats in one serving. Choose foods that have   a low amount or none of these fats.  Check the number of milligrams (mg) of salt (sodium) in one serving. Most people should limit total sodium intake to less than 2,300 mg per day.  Always check the nutrition information of foods labeled as "low-fat" or "nonfat." These foods may be higher in added sugar or refined carbs and should be avoided.  Talk to your dietitian to identify your daily goals for nutrients listed on the label. Shopping  Avoid buying canned, pre-made, or processed foods. These foods tend to be high in fat, sodium, and added  sugar.  Shop around the outside edge of the grocery store. This is where you will most often find fresh fruits and vegetables, bulk grains, fresh meats, and fresh dairy. Cooking  Use low-heat cooking methods, such as baking, instead of high-heat cooking methods like deep frying.  Cook using healthy oils, such as olive, canola, or sunflower oil.  Avoid cooking with butter, cream, or high-fat meats. Meal planning  Eat meals and snacks regularly, preferably at the same times every day. Avoid going long periods of time without eating.  Eat foods that are high in fiber, such as fresh fruits, vegetables, beans, and whole grains. Talk with your dietitian about how many servings of carbs you can eat at each meal.  Eat 4-6 oz (112-168 g) of lean protein each day, such as lean meat, chicken, fish, eggs, or tofu. One ounce (oz) of lean protein is equal to: ? 1 oz (28 g) of meat, chicken, or fish. ? 1 egg. ?  cup (62 g) of tofu.  Eat some foods each day that contain healthy fats, such as avocado, nuts, seeds, and fish.   What foods should I eat? Fruits Berries. Apples. Oranges. Peaches. Apricots. Plums. Grapes. Mango. Papaya. Pomegranate. Kiwi. Cherries. Vegetables Lettuce. Spinach. Leafy greens, including kale, chard, collard greens, and mustard greens. Beets. Cauliflower. Cabbage. Broccoli. Carrots. Green beans. Tomatoes. Peppers. Onions. Cucumbers. Brussels sprouts. Grains Whole grains, such as whole-wheat or whole-grain bread, crackers, tortillas, cereal, and pasta. Unsweetened oatmeal. Quinoa. Brown or wild rice. Meats and other proteins Seafood. Poultry without skin. Lean cuts of poultry and beef. Tofu. Nuts. Seeds. Dairy Low-fat or fat-free dairy products such as milk, yogurt, and cheese. The items listed above may not be a complete list of foods and beverages you can eat. Contact a dietitian for more information. What foods should I avoid? Fruits Fruits canned with  syrup. Vegetables Canned vegetables. Frozen vegetables with butter or cream sauce. Grains Refined white flour and flour products such as bread, pasta, snack foods, and cereals. Avoid all processed foods. Meats and other proteins Fatty cuts of meat. Poultry with skin. Breaded or fried meats. Processed meat. Avoid saturated fats. Dairy Full-fat yogurt, cheese, or milk. Beverages Sweetened drinks, such as soda or iced tea. The items listed above may not be a complete list of foods and beverages you should avoid. Contact a dietitian for more information. Questions to ask a health care provider  Do I need to meet with a diabetes educator?  Do I need to meet with a dietitian?  What number can I call if I have questions?  When are the best times to check my blood glucose? Where to find more information:  American Diabetes Association: diabetes.org  Academy of Nutrition and Dietetics: www.eatright.org  National Institute of Diabetes and Digestive and Kidney Diseases: www.niddk.nih.gov  Association of Diabetes Care and Education Specialists: www.diabeteseducator.org Summary  It is important to have healthy eating   habits because your blood sugar (glucose) levels are greatly affected by what you eat and drink.  A healthy meal plan will help you control your blood glucose and maintain a healthy lifestyle.  Your health care provider may recommend that you work with a dietitian to make a meal plan that is best for you.  Keep in mind that carbohydrates (carbs) and alcohol have immediate effects on your blood glucose levels. It is important to count carbs and to use alcohol carefully. This information is not intended to replace advice given to you by your health care provider. Make sure you discuss any questions you have with your health care provider. Document Revised: 04/29/2019 Document Reviewed: 04/29/2019 Elsevier Patient Education  2021 [employer]  

## 2020-06-15 NOTE — Progress Notes (Signed)
Endocrinology Consult Note       06/15/2020, 3:22 PM   Subjective:    Patient ID: Samantha Pierce, female    DOB: November 24, 1970.  Samantha Pierce is being seen in consultation for management of currently uncontrolled symptomatic diabetes requested by  Rosita Fire, MD.   Past Medical History:  Diagnosis Date  . Anemia   . Anxiety   . Arthritis   . Asystole ([place])    a. 05/2015: pt developed bradycardia->asystole during [facility] nuclear stress test, s/p brief code. Cath with only 30% prox LCx. Her asystole during Lexiscan infusion was felt to represent an excessive pharmacologic response to the agent and likely superimposed vasovagal response.  . Bipolar disorder ([place])   . CKD (chronic kidney disease) stage 4, GFR 15-29 ml/min (HCC)   . COPD (chronic obstructive pulmonary disease) ([place])   . Depression   . Essential hypertension   . GERD (gastroesophageal reflux disease)   . History of blood transfusion   . History of seizure 2017  . History of stroke April 2016   Acute lacunar infarct of the left thalamus  . Leg cramps   . Mild CAD    a. LHC 05/2015: 30% prox Cx, otherwise widely patent.  . Morbid obesity ([place])   . Neuropathy   . PTSD (post-traumatic stress disorder)   . Type 2 diabetes mellitus ([place])     Past Surgical History:  Procedure Laterality Date  . AV FISTULA PLACEMENT Right 01/08/2017   Procedure: RIGHT ARM BRACHIOCEPHALIC ARTERIOVENOUS (AV) FISTULA CREATION;  Surgeon: Waynetta Sandy, MD;  Location: [place];  Service: Vascular;  Laterality: Right;  . AV FISTULA PLACEMENT Left 11/20/2018   Procedure: Creation of LEFT ARM ARTERIOVENOUS (AV) FISTULA;  Surgeon: Waynetta Sandy, MD;  Location: [place];  Service: Vascular;  Laterality: Left;  . BASCILIC VEIN TRANSPOSITION Left 02/11/2019   Procedure: BASILIC VEIN TRANSPOSITION SECOND STAGE;  Surgeon: Waynetta Sandy, MD;   Location: [place];  Service: Vascular;  Laterality: Left;  . CARDIAC CATHETERIZATION N/A 06/01/2015   Procedure: Left Heart Cath and Coronary Angiography;  Surgeon: Belva Crome, MD; CFX 30%, no other dz, EF nl by echo  . CESAREAN SECTION     x2  . DILATION AND CURETTAGE OF UTERUS    . EYE SURGERY     Right eye pars plano vitrectomy   . HEMATOMA EVACUATION Left 03/03/2019   Procedure: EVACUATION HEMATOMA;  Surgeon: Elam Dutch, MD;  Location: [place];  Service: Vascular;  Laterality: Left;  . LASIK    . PARS PLANA VITRECTOMY  04/20/2011   Procedure: PARS PLANA VITRECTOMY WITH 25 GAUGE;  Surgeon: Hayden Pedro, MD;  Location: [place];  Service: Ophthalmology;  Laterality: Left;  membrane peel, gas fluid exchange, endolaser, repair of complex retinal detachment  . TUBAL LIGATION      Social History   Socioeconomic History  . Marital status: Single    Spouse name: Not on file  . Number of children: Not on file  . Years of education: Not on file  . Highest education level: Not on file  Occupational History  . Not on file  Tobacco Use  .  Smoking status: Current Some Day Smoker    Packs/day: 0.03    Years: 30.00    Pack years: 0.90    Types: Cigarettes    Start date: 06/05/1981    Last attempt to quit: 12/04/2018    Years since quitting: 1.5  . Smokeless tobacco: Never Used  Vaping Use  . Vaping Use: Never used  Substance and Sexual Activity  . Alcohol use: Yes    Alcohol/week: 0.0 standard drinks    Comment: occ wine  . Drug use: No  . Sexual activity: Yes    Birth control/protection: Surgical  Other Topics Concern  . Not on file  Social History Narrative  . Not on file   Social Determinants of Health   Financial Resource Strain: Not on file  Food Insecurity: Not on file  Transportation Needs: Not on file  Physical Activity: Not on file  Stress: Not on file  Social Connections: Not on file    Family History  Problem Relation Age of Onset  . Diabetes Mother   .  Kidney failure Mother   . Diabetes Father   . Amblyopia Neg Hx   . Blindness Neg Hx   . Cataracts Neg Hx   . Glaucoma Neg Hx   . Macular degeneration Neg Hx   . Retinal detachment Neg Hx   . Strabismus Neg Hx   . Retinitis pigmentosa Neg Hx     Outpatient Encounter Medications as of 06/15/2020  Medication Sig  . albuterol (PROVENTIL HFA;VENTOLIN HFA) 108 (90 BASE) MCG/ACT inhaler Inhale 1-2 puffs into the lungs every 6 (six) hours as needed for wheezing or shortness of breath. (Patient taking differently: Inhale 1-2 puffs into the lungs every 6 (six) hours as needed for shortness of breath.)  . amLODipine (NORVASC) 10 MG tablet Take 1 tablet (10 mg total) by mouth daily.  Marland Kitchen atorvastatin (LIPITOR) 80 MG tablet Take 1 tablet (80 mg total) by mouth daily at 6 PM. (Patient taking differently: Take 80 mg by mouth every morning.)  . cloNIDine (CATAPRES) 0.2 MG tablet Take 0.2 mg by mouth daily.  . clopidogrel (PLAVIX) 75 MG tablet Take 1 tablet (75 mg total) by mouth daily.  Marland Kitchen FLUoxetine (PROZAC) 20 MG capsule Take 20 mg by mouth daily.  . furosemide (LASIX) 80 MG tablet Take 80 mg by mouth 2 (two) times daily.  Marland Kitchen gabapentin (NEURONTIN) 300 MG capsule Take 300 mg by mouth at bedtime.   . insulin aspart (NOVOLOG) 100 UNIT/ML FlexPen Inject 10 Units into the skin 3 (three) times daily before meals.   . Insulin Detemir (LEVEMIR) 100 UNIT/ML Pen Inject 50 Units into the skin at bedtime. (Patient taking differently: Inject 80 Units into the skin at bedtime.)  . metoprolol succinate (TOPROL-XL) 50 MG 24 hr tablet Take 50 mg by mouth daily.  Marland Kitchen omeprazole (PRILOSEC) 20 MG capsule Take 20 mg by mouth daily before breakfast.   . traZODone (DESYREL) 100 MG tablet Take 100 mg by mouth at bedtime.  . [DISCONTINUED] budesonide-formoterol (SYMBICORT) 160-4.5 MCG/ACT inhaler Inhale 2 puffs into the lungs every morning.  . [DISCONTINUED] dorzolamide (TRUSOPT) 2 % ophthalmic solution Place 1 drop into both eyes 2  (two) times daily as needed (eye pain.).   . [DISCONTINUED] hydrALAZINE (APRESOLINE) 25 MG tablet Take 1 tablet (25 mg total) by mouth 2 (two) times daily.  . [DISCONTINUED] sodium bicarbonate 650 MG tablet Take 650 mg by mouth 2 (two) times daily.   . [DISCONTINUED] tiotropium (SPIRIVA) 18 MCG  inhalation capsule Place 18 mcg into inhaler and inhale every morning.    No facility-administered encounter medications on file as of 06/15/2020.    ALLERGIES: Allergies  Allergen Reactions  . Contrast Media [Iodinated Diagnostic Agents] Anaphylaxis    "code blue--I died"  . Penicillins Rash and Other (See Comments)    Has patient had a PCN reaction causing immediate rash, facial/tongue/throat swelling, SOB or lightheadedness with hypotension: Yes Has patient had a PCN reaction causing severe rash involving mucus membranes or skin necrosis: No Has patient had a PCN reaction that required hospitalization No Has patient had a PCN reaction occurring within the last 10 years: No If all of the above answers are "NO", then may proceed with Cephalosporin use.     VACCINATION STATUS: Immunization History  Administered Date(s) Administered  . Influenza,trivalent, recombinat, inj, PF 07/05/2016  . Moderna Sars-Covid-2 Vaccination 10/27/2019, 11/24/2019  . Pneumococcal Polysaccharide-23 09/05/2014    Diabetes She presents for her initial diabetic visit. She has type 2 diabetes mellitus. Onset time: She was diagnosed at approximate age of 38. Her disease course has been worsening. There are no hypoglycemic associated symptoms. Associated symptoms include blurred vision, fatigue, foot paresthesias, polydipsia, polyphagia and polyuria. There are no hypoglycemic complications. Symptoms are worsening. Diabetic complications include a CVA, heart disease, nephropathy, peripheral neuropathy and retinopathy. (Legally blind in right eye, says she was hospitalized for DKA once) Risk factors for coronary artery  disease include diabetes mellitus, dyslipidemia, hypertension, obesity, sedentary lifestyle and tobacco exposure. Current diabetic treatment includes intensive insulin program. She is compliant with treatment most of the time. Her weight is fluctuating minimally (says she has been heavy all of her life). She is following a generally healthy diet. When asked about meal planning, she reported none. She has not had a previous visit with a dietitian. She rarely participates in exercise. (She presents today for her consultation with no meter or logs to review.  Her most recent A1c on 05/07/20 was 12.4%.  She has not been monitoring her glucose routinely.  She endorses eating mostly healthy, however she sometimes skips breakfast and consumes sugar substitutes to her water like crystal light.  She does not engage in routine physical exercise.  She has CKD stage 4, not yet on HD.  She denies any recent s/s of hypoglycemia.) An ACE inhibitor/angiotensin II receptor blocker is not being taken. She does not see a podiatrist.Eye exam is current.  Hyperlipidemia This is a chronic problem. The current episode started more than 1 year ago. The problem is uncontrolled. Recent lipid tests were reviewed and are high. Exacerbating diseases include chronic renal disease, diabetes and obesity. Factors aggravating her hyperlipidemia include beta blockers and fatty foods. Current antihyperlipidemic treatment includes statins. The current treatment provides moderate improvement of lipids. Compliance problems include adherence to diet and adherence to exercise.  Risk factors for coronary artery disease include diabetes mellitus, dyslipidemia, hypertension, obesity and a sedentary lifestyle.  Hypertension This is a chronic problem. The current episode started more than 1 year ago. The problem has been gradually improving since onset. The problem is uncontrolled. Associated symptoms include blurred vision. There are no associated agents to  hypertension. Risk factors for coronary artery disease include diabetes mellitus, dyslipidemia, obesity, sedentary lifestyle and smoking/tobacco exposure. Past treatments include calcium channel blockers, diuretics and central alpha agonists. The current treatment provides mild improvement. There are no compliance problems.  Hypertensive end-organ damage includes kidney disease, CAD/MI, CVA, heart failure and retinopathy. Identifiable causes of hypertension include  chronic renal disease.     Review of systems  Constitutional: + Minimally fluctuating body weight,  current Body mass index is 44.53 kg/m. , + fatigue, no subjective hyperthermia, no subjective hypothermia Eyes: + blurry vision (diabetic retinopathy and legally blind in Right eye), no xerophthalmia ENT: no sore throat, no nodules palpated in throat, no dysphagia/odynophagia, no hoarseness Cardiovascular: no chest pain, no shortness of breath, no palpitations, no leg swelling Respiratory: no cough, no shortness of breath Gastrointestinal: no nausea/vomiting/diarrhea Genitourinary: + polyuria Musculoskeletal: no muscle/joint aches Skin: no rashes, no hyperemia Neurological: no tremors, no dizziness, + numbness/tingling to BLE Psychiatric: no depression, no anxiety  Objective:     BP (!) 163/82 (BP Location: Right Arm)   Pulse 63   Ht 5\' 4"  (1.626 m)   Wt 259 lb 6.4 oz (117.7 kg)   BMI 44.53 kg/m   Wt Readings from Last 3 Encounters:  06/15/20 259 lb 6.4 oz (117.7 kg)  10/14/19 260 lb (117.9 kg)  06/16/19 264 lb (119.7 kg)    BP Readings from Last 3 Encounters:  06/15/20 (!) 163/82  06/16/19 (!) 185/83  04/22/19 (!) 167/82    Physical Exam- Limited  Constitutional:  Body mass index is 44.53 kg/m., not in acute distress, normal state of mind Eyes:  EOMI, no exophthalmos Neck: Supple Cardiovascular: RRR, no murmers, rubs, or gallops, no edema Respiratory: Adequate breathing efforts, no crackles, rales, rhonchi, or  wheezing Musculoskeletal: no gross deformities, strength intact in all four extremities, no gross restriction of joint movements Skin:  no rashes, no hyperemia Neurological: no tremor with outstretched hands    CMP ( most recent) CMP     Component Value Date/Time   NA 126 (L) 03/04/2019 0616   K 4.6 03/04/2019 0616   CL 96 (L) 03/04/2019 0616   CO2 21 (L) 03/04/2019 0616   GLUCOSE 339 (H) 03/04/2019 0616   BUN 47 (H) 03/04/2019 0616   CREATININE 4.72 (H) 03/04/2019 0616   CALCIUM 7.8 (L) 03/04/2019 0616   PROT 6.8 02/22/2019 1222   ALBUMIN 2.9 (L) 02/22/2019 1222   AST 16 02/22/2019 1222   ALT 13 02/22/2019 1222   ALKPHOS 60 02/22/2019 1222   BILITOT 0.6 02/22/2019 1222   GFRNONAA 10 (L) 03/04/2019 0616   GFRAA 12 (L) 03/04/2019 0616     Diabetic Labs (most recent): Lab Results  Component Value Date   HGBA1C 12.4 05/08/2020   HGBA1C 9.8 (H) 01/19/2019   HGBA1C 10.4 (H) 05/23/2015     Lipid Panel ( most recent) Lipid Panel     Component Value Date/Time   CHOL 245 (H) 05/23/2015 0545   TRIG 278 (H) 05/23/2015 0545   HDL 45 05/23/2015 0545   CHOLHDL 5.4 05/23/2015 0545   VLDL 56 (H) 05/23/2015 0545   LDLCALC 144 (H) 05/23/2015 0545      Lab Results  Component Value Date   TSH 1.524 09/04/2014           Assessment & Plan:   1) Uncontrolled diabetes mellitus with stage 4 chronic kidney disease ([place])  She presents today for her consultation with no meter or logs to review.  Her most recent A1c on 05/07/20 was 12.4%.  She has not been monitoring her glucose routinely.  She endorses eating mostly healthy, however she sometimes skips breakfast and consumes sugar substitutes to her water like crystal light.  She does not engage in routine physical exercise.  She has CKD stage 4, not yet on HD.  She denies any recent s/s of hypoglycemia.  - Samantha Pierce has currently uncontrolled symptomatic type 2 DM since 50 years of age, with most recent A1c of 12.4 %.    -Recent labs reviewed.  - I had a long discussion with her about the progressive nature of diabetes and the pathology behind its complications. -her diabetes is complicated by CVA, retinopathy, neuropathy, CKD, and DKA x 1 episode and she remains at a high risk for more acute and chronic complications which include worsening CAD, CVA, CKD, retinopathy, and neuropathy. These are all discussed in detail with her.  - I have counseled her on diet  and weight management  by adopting a carbohydrate restricted/protein rich diet. Patient is encouraged to switch to unprocessed or minimally processed complex starch and increased protein intake (animal or plant source), fruits, and vegetables. -  she is advised to stick to a routine mealtimes to eat 3 meals a day and avoid unnecessary snacks (to snack only to correct hypoglycemia).   - she acknowledges that there is a room for improvement in her food and drink choices. - Suggestion is made for her to avoid simple carbohydrates  from her diet including Cakes, Sweet Desserts, Ice Cream, Soda (diet and regular), Sweet Tea, Candies, Chips, Cookies, Store Bought Juices, Alcohol in Excess of  1-2 drinks a day, Artificial Sweeteners, Coffee Creamer, and "Sugar-free" Products. This will help patient to have more stable blood glucose profile and potentially avoid unintended weight gain.  - she will be scheduled with Jearld Fenton, RDN, CDE for diabetes education.  - I have approached her with the following individualized plan to manage  her diabetes and patient agrees:   -She is advised to continue Levemir 80 units SQ daily at bedtime and adjust her Novolog to 10-16 units TID with meals if glucose is above 90 and she is eating.  Specific instructions on how to titrate insulin dose based on glucose readings given to patient in writing.  -she is encouraged to start monitoring glucose 4 times daily, before meals and before bed, to log their readings on the clinic  sheets provided, and bring them to review at follow up appointment in 2 weeks.  - she is warned not to take insulin without proper monitoring per orders. - Adjustment parameters are given to her for hypo and hyperglycemia in writing. - she is encouraged to call clinic for blood glucose levels less than 70 or above 300 mg /dl.  - she is not a candidate for Metformin, SGLT2 due to concurrent renal insufficiency.  - she will be considered for incretin therapy as appropriate next visit.  - Specific targets for  A1c;  LDL, HDL,  and Triglycerides were discussed with the patient.  2) Blood Pressure /Hypertension:  her blood pressure is not controlled to target.   she is advised to continue her current medications including Norvasc 10 mg po daily, Clonidine 0.2 mg po daily, Lasix 80 mg po twice daily, and Metoprolol 50 mg  mg p.o. daily with breakfast.  3) Lipids/Hyperlipidemia:    There is no recent lipid panel to review.  She is advised to continue Lipitor 80 mg po daily before bed.  Side effects and precautions discussed with her.  Will consider rechecking lipid panel on subsequent visits.   4)  Weight/Diet:  her Body mass index is 44.53 kg/m.  -  clearly complicating her diabetes care.   she is a candidate for weight loss. I discussed with her the fact  that loss of 5 - 10% of her  current body weight will have the most impact on her diabetes management.  Exercise, and detailed carbohydrates information provided  -  detailed on discharge instructions.  5) Chronic Care/Health Maintenance: -she is not on ACEI/ARB and is on Statin medications and is encouraged to initiate and continue to follow up with Ophthalmology, Dentist, Podiatrist at least yearly or according to recommendations, and advised to QUIT smoking altogether. I have recommended yearly flu vaccine and pneumonia vaccine at least every 5 years; moderate intensity exercise for up to 150 minutes weekly; and sleep for at least 7 hours a  day.  - she is advised to maintain close follow up with Rosita Fire, MD for primary care needs, as well as her other providers for optimal and coordinated care.   - Time spent in this patient care: 60 min, of which > 50% was spent in  counseling  her about her diabetes and the rest reviewing her blood glucose logs , discussing her hypoglycemia and hyperglycemia episodes, reviewing her current and  previous labs / studies  ( including abstraction from other facilities) and medications  doses and developing a  long term treatment plan based on the latest standards of care/ guidelines; and documenting her care.    Please refer to Patient Instructions for Blood Glucose Monitoring and Insulin/Medications Dosing Guide"  in media tab for additional information. Please  also refer to " Patient Self Inventory" in the Media  tab for reviewed elements of pertinent patient history.  Samantha Pierce participated in the discussions, expressed understanding, and voiced agreement with the above plans.  All questions were answered to her satisfaction. she is encouraged to contact clinic should she have any questions or concerns prior to her return visit.   Follow up plan: - Return in about 2 weeks (around 06/29/2020) for Diabetes follow up, Bring glucometer and logs.  [name], [facility] [address] [place], [place] [address] Phone: [phone] Fax: [phone]  06/15/2020, 3:22 PM

## 2020-06-18 ENCOUNTER — Ambulatory Visit: Payer: Medicaid Other | Admitting: Vascular Surgery

## 2020-06-20 DIAGNOSIS — N184 Chronic kidney disease, stage 4 (severe): Secondary | ICD-10-CM | POA: Diagnosis not present

## 2020-06-20 DIAGNOSIS — E113591 Type 2 diabetes mellitus with proliferative diabetic retinopathy without macular edema, right eye: Secondary | ICD-10-CM | POA: Diagnosis not present

## 2020-06-29 ENCOUNTER — Ambulatory Visit: Payer: Medicare HMO | Admitting: Nurse Practitioner

## 2020-07-06 ENCOUNTER — Other Ambulatory Visit: Payer: Self-pay

## 2020-07-06 ENCOUNTER — Encounter: Payer: Medicare HMO | Attending: Nurse Practitioner | Admitting: Nutrition

## 2020-07-06 VITALS — Ht 64.0 in | Wt 253.0 lb

## 2020-07-06 DIAGNOSIS — I10 Essential (primary) hypertension: Secondary | ICD-10-CM

## 2020-07-06 DIAGNOSIS — N184 Chronic kidney disease, stage 4 (severe): Secondary | ICD-10-CM

## 2020-07-06 DIAGNOSIS — E782 Mixed hyperlipidemia: Secondary | ICD-10-CM | POA: Diagnosis not present

## 2020-07-06 DIAGNOSIS — Z794 Long term (current) use of insulin: Secondary | ICD-10-CM | POA: Diagnosis not present

## 2020-07-06 DIAGNOSIS — E1165 Type 2 diabetes mellitus with hyperglycemia: Secondary | ICD-10-CM | POA: Diagnosis not present

## 2020-07-06 NOTE — Progress Notes (Signed)
Medical Nutrition Therapy  Appointment Start time:  7014 Appointment End time:  1030 This visit was completed via telephone due to the COVID-19 pandemic.   I spoke with Samantha Pierce and verified that I was speaking with the correct person with two patient identifiers (full name and date of birth).   I discussed the limitations related to this kind of visit and the patient is willing to proceed.  Primary concerns today: DM and CKD. Referral diagnosis: E11.8, N18.4,  Preferred learning style: auditory. Legally blind Learning readiness: change in progress  NUTRITION ASSESSMENT  Has been diabetic 30 years. [name]'s with her blood sugars. Wiling to get serious and do much better with taking medications correctly and eating better. Eating better and checking blood sugars.  Scheduled to see [name], FNP at [facility] for her DM tomorrow. Levemir 80 units daily at night and Novolog 14 units plus sliding scale with meals. Has had a few low blood sugars. Trying to work on timing of meals better. FBS; 75-170  Before lunch 200's Before dinner 180-200's Bedtime 200-300's Working better on testing blood sugars Legally blind One touch meter. Has trouble with small strips due to vision issues. Nees a voice talking meter due to visual impairment.   Anthropometrics  Wt Readings from Last 3 Encounters:  07/06/20 253 lb (114.8 kg)  06/15/20 259 lb 6.4 oz (117.7 kg)  10/14/19 260 lb (117.9 kg)   Ht Readings from Last 3 Encounters:  07/06/20 5' 4" (1.626 m)  06/15/20 5' 4" (1.626 m)  10/14/19 5' 4" (1.626 m)   Body mass index is 43.43 kg/m. _0 @ Facility age limit for growth percentiles is 20 years. Facility age limit for growth percentiles is 20 years.   Clinical Medical Hx: DM Type 2, Obesity, HTN and Hyperlipidemia, CKD Medications: Bubble packs,Levermi 80 units, Novologu 10 units with meals. Labs:  Lab Results  Component Value Date   HGBA1C 12.4 05/08/2020   CMP Latest Ref Rng &  Units 03/04/2019 03/03/2019 03/02/2019  Glucose 70 - 99 mg/dL 339(H) 122(H) 336(H)  BUN 6 - 20 mg/dL 47(H) 42(H) 46(H)  Creatinine 0.44 - 1.00 mg/dL 4.72(H) 4.40(H) 4.06(H)  Sodium 135 - 145 mmol/L 126(L) 132(L) 126(L)  Potassium 3.5 - 5.1 mmol/L 4.6 4.2 4.2  Chloride 98 - 111 mmol/L 96(L) 98 97(L)  CO2 22 - 32 mmol/L 21(L) - 19(L)  Calcium 8.9 - 10.3 mg/dL 7.8(L) - 7.9(L)  Total Protein 6.5 - 8.1 g/dL - - -  Total Bilirubin 0.3 - 1.2 mg/dL - - -  Alkaline Phos 38 - 126 U/L - - -  AST 15 - 41 U/L - - -  ALT 0 - 44 U/L - - -    Notable Signs/Symptoms:   Lifestyle & Dietary Hx    Estimated daily fluid intake: 45  oz Supplements:  Sleep:  Stress / self-care: Legally blind, chronic kidney failure-no dialysis, Current average weekly physical activity: ADL  24-Hr Dietary Recall First Meal:Cherrios and fruit cocktail Snack:  Second Meal: chicken with salad, or soup LS, with fruit Snack: Third Meal:Meat, vegetables, water, fruit Snack: sometimes fruit  Beverages: water,   Estimated Energy Needs Calories: 1400  Carbohydrate: 158 Protein: 80 Fat: 45g   NUTRITION DIAGNOSIS  NB-1.1 Food and nutrition-related knowledge deficit As related to Diabetes Type 2, CKD  As evidenced by A1C 12.4% and eGFR.   NUTRITION INTERVENTION  Nutrition education (E-1) on the following topics:  Nutrition and Diabetes education provided on My Plate, CHO counting, meal  planning, portion sizes, timing of meals, avoiding snacks between meals unless having a low blood sugar, target ranges for A1C and blood sugars, signs/symptoms and treatment of hyper/hypoglycemia, monitoring blood sugars, taking medications as prescribed, benefits of exercising 30 minutes per day and prevention of complications of DM. Chronic Kidney disease and low potassium and low phosphorus diet.  Handouts Provided emailed/mail  Include   CKD nutrition therpy  My Plate  Diabetes instrucitons  S/S hyper/hypoglycemia       Low  Salt Diet        Learning Style & Readiness for Change Teaching method utilized: Visual & Auditory  Demonstrated degree of understanding via: Teach Back  Barriers to learning/adherence to lifestyle change: Visual impairment  Goals Established by Pt  Follow The Renal Plate Method Take insulin as prescribed Eat meals on time. Increase lower carb vegetables. Get A1C down to 8%   MONITORING & EVALUATION Dietary intake, weekly physical activity, and blood sugars  in 1 month.. Would benefit from a CGM.  Next Steps  Patient is to Track blood sugars and meals. Marland Kitchen

## 2020-07-07 ENCOUNTER — Encounter: Payer: Self-pay | Admitting: Nurse Practitioner

## 2020-07-07 ENCOUNTER — Ambulatory Visit (INDEPENDENT_AMBULATORY_CARE_PROVIDER_SITE_OTHER): Payer: Medicare HMO | Admitting: Nurse Practitioner

## 2020-07-07 VITALS — BP 168/81 | HR 61 | Ht 64.0 in | Wt 267.8 lb

## 2020-07-07 DIAGNOSIS — N184 Chronic kidney disease, stage 4 (severe): Secondary | ICD-10-CM

## 2020-07-07 DIAGNOSIS — I10 Essential (primary) hypertension: Secondary | ICD-10-CM | POA: Diagnosis not present

## 2020-07-07 DIAGNOSIS — E1122 Type 2 diabetes mellitus with diabetic chronic kidney disease: Secondary | ICD-10-CM | POA: Diagnosis not present

## 2020-07-07 DIAGNOSIS — E782 Mixed hyperlipidemia: Secondary | ICD-10-CM

## 2020-07-07 DIAGNOSIS — E1165 Type 2 diabetes mellitus with hyperglycemia: Secondary | ICD-10-CM | POA: Diagnosis not present

## 2020-07-07 DIAGNOSIS — IMO0002 Reserved for concepts with insufficient information to code with codable children: Secondary | ICD-10-CM

## 2020-07-07 NOTE — Patient Instructions (Signed)

## 2020-07-07 NOTE — Progress Notes (Signed)
Endocrinology Follow Up Visit      07/07/2020, 3:35 PM   Subjective:    Patient ID: Samantha Pierce, female    DOB: 14-Jun-1970.  Samantha Pierce is being seen in follow up after being seen in consultation for management of currently uncontrolled symptomatic diabetes requested by  Rosita Fire, MD.   Past Medical History:  Diagnosis Date  . Anemia   . Anxiety   . Arthritis   . Asystole ([place])    a. 05/2015: pt developed bradycardia->asystole during Optima nuclear stress test, s/p brief code. Cath with only 30% prox LCx. Her asystole during Lexiscan infusion was felt to represent an excessive pharmacologic response to the agent and likely superimposed vasovagal response.  . Bipolar disorder ([place])   . CKD (chronic kidney disease) stage 4, GFR 15-29 ml/min (HCC)   . COPD (chronic obstructive pulmonary disease) ([place])   . Depression   . Essential hypertension   . GERD (gastroesophageal reflux disease)   . History of blood transfusion   . History of seizure 2017  . History of stroke April 2016   Acute lacunar infarct of the left thalamus  . Leg cramps   . Mild CAD    a. LHC 05/2015: 30% prox Cx, otherwise widely patent.  . Morbid obesity ([place])   . Neuropathy   . PTSD (post-traumatic stress disorder)   . Type 2 diabetes mellitus ([place])     Past Surgical History:  Procedure Laterality Date  . AV FISTULA PLACEMENT Right 01/08/2017   Procedure: RIGHT ARM BRACHIOCEPHALIC ARTERIOVENOUS (AV) FISTULA CREATION;  Surgeon: Waynetta Sandy, MD;  Location: [facility];  Service: Vascular;  Laterality: Right;  . AV FISTULA PLACEMENT Left 11/20/2018   Procedure: Creation of LEFT ARM ARTERIOVENOUS (AV) FISTULA;  Surgeon: Waynetta Sandy, MD;  Location: [facility];  Service: Vascular;  Laterality: Left;  . BASCILIC VEIN TRANSPOSITION Left 02/11/2019   Procedure: BASILIC VEIN TRANSPOSITION SECOND STAGE;  Surgeon: Waynetta Sandy, MD;  Location: [facility];  Service: Vascular;  Laterality: Left;  . CARDIAC CATHETERIZATION N/A 06/01/2015   Procedure: Left Heart Cath and Coronary Angiography;  Surgeon: Belva Crome, MD; CFX 30%, no other dz, EF nl by echo  . CESAREAN SECTION     x2  . DILATION AND CURETTAGE OF UTERUS    . EYE SURGERY     Right eye pars plano vitrectomy   . HEMATOMA EVACUATION Left 03/03/2019   Procedure: EVACUATION HEMATOMA;  Surgeon: Elam Dutch, MD;  Location: [facility];  Service: Vascular;  Laterality: Left;  . LASIK    . PARS PLANA VITRECTOMY  04/20/2011   Procedure: PARS PLANA VITRECTOMY WITH 25 GAUGE;  Surgeon: Hayden Pedro, MD;  Location: [facility];  Service: Ophthalmology;  Laterality: Left;  membrane peel, gas fluid exchange, endolaser, repair of complex retinal detachment  . TUBAL LIGATION      Social History   Socioeconomic History  . Marital status: Single    Spouse name: Not on file  . Number of children: Not on file  . Years of education: Not on file  . Highest education level: Not on file  Occupational History  . Not  on file  Tobacco Use  . Smoking status: Current Some Day Smoker    Packs/day: 0.03    Years: 30.00    Pack years: 0.90    Types: Cigarettes    Start date: 06/05/1981    Last attempt to quit: 12/04/2018    Years since quitting: 1.5  . Smokeless tobacco: Never Used  Vaping Use  . Vaping Use: Never used  Substance and Sexual Activity  . Alcohol use: Yes    Alcohol/week: 0.0 standard drinks    Comment: occ wine  . Drug use: No  . Sexual activity: Yes    Birth control/protection: Surgical  Other Topics Concern  . Not on file  Social History Narrative  . Not on file   Social Determinants of Health   Financial Resource Strain: Not on file  Food Insecurity: Not on file  Transportation Needs: Not on file  Physical Activity: Not on file  Stress: Not on file  Social Connections: Not on file    Family History  Problem Relation Age of Onset   . Diabetes Mother   . Kidney failure Mother   . Diabetes Father   . Amblyopia Neg Hx   . Blindness Neg Hx   . Cataracts Neg Hx   . Glaucoma Neg Hx   . Macular degeneration Neg Hx   . Retinal detachment Neg Hx   . Strabismus Neg Hx   . Retinitis pigmentosa Neg Hx     Outpatient Encounter Medications as of 07/07/2020  Medication Sig  . albuterol (PROVENTIL HFA;VENTOLIN HFA) 108 (90 BASE) MCG/ACT inhaler Inhale 1-2 puffs into the lungs every 6 (six) hours as needed for wheezing or shortness of breath. (Patient taking differently: Inhale 1-2 puffs into the lungs every 6 (six) hours as needed for shortness of breath.)  . amLODipine (NORVASC) 10 MG tablet Take 1 tablet (10 mg total) by mouth daily.  Marland Kitchen atorvastatin (LIPITOR) 80 MG tablet Take 1 tablet (80 mg total) by mouth daily at 6 PM. (Patient taking differently: Take 80 mg by mouth every morning.)  . cloNIDine (CATAPRES) 0.2 MG tablet Take 0.2 mg by mouth daily.  . clopidogrel (PLAVIX) 75 MG tablet Take 1 tablet (75 mg total) by mouth daily.  Marland Kitchen FLUoxetine (PROZAC) 20 MG capsule Take 20 mg by mouth daily.  . furosemide (LASIX) 80 MG tablet Take 80 mg by mouth 2 (two) times daily.  Marland Kitchen gabapentin (NEURONTIN) 300 MG capsule Take 300 mg by mouth at bedtime.   . hydrALAZINE (APRESOLINE) 50 MG tablet Take 50 mg by mouth 2 (two) times daily.  . insulin aspart (NOVOLOG) 100 UNIT/ML FlexPen Inject 10 Units into the skin 3 (three) times daily before meals.   . Insulin Detemir (LEVEMIR) 100 UNIT/ML Pen Inject 50 Units into the skin at bedtime. (Patient taking differently: Inject 80 Units into the skin at bedtime.)  . metoprolol succinate (TOPROL-XL) 50 MG 24 hr tablet Take 50 mg by mouth daily.  Marland Kitchen OLANZapine (ZYPREXA) 2.5 MG tablet Take 2.5 mg by mouth daily.  Marland Kitchen omeprazole (PRILOSEC) 20 MG capsule Take 20 mg by mouth daily before breakfast.   . traZODone (DESYREL) 100 MG tablet Take 100 mg by mouth at bedtime.  Marland Kitchen ZIOPTAN 0.0015 % SOLN Apply 1 drop to  eye at bedtime.   No facility-administered encounter medications on file as of 07/07/2020.    ALLERGIES: Allergies  Allergen Reactions  . Contrast Media [Iodinated Diagnostic Agents] Anaphylaxis    "code blue--I died"  . Penicillins  Rash and Other (See Comments)    Has patient had a PCN reaction causing immediate rash, facial/tongue/throat swelling, SOB or lightheadedness with hypotension: Yes Has patient had a PCN reaction causing severe rash involving mucus membranes or skin necrosis: No Has patient had a PCN reaction that required hospitalization No Has patient had a PCN reaction occurring within the last 10 years: No If all of the above answers are "NO", then may proceed with Cephalosporin use.     VACCINATION STATUS: Immunization History  Administered Date(s) Administered  . Influenza,trivalent, recombinat, inj, PF 07/05/2016  . Moderna Sars-Covid-2 Vaccination 10/27/2019, 11/24/2019  . Pneumococcal Polysaccharide-23 09/05/2014    Diabetes She presents for her follow-up diabetic visit. She has type 2 diabetes mellitus. Onset time: She was diagnosed at approximate age of 5. Her disease course has been improving. There are no hypoglycemic associated symptoms. Associated symptoms include blurred vision, fatigue, foot paresthesias, polydipsia, polyphagia and polyuria. There are no hypoglycemic complications. Symptoms are improving. Diabetic complications include a CVA, heart disease, nephropathy, peripheral neuropathy and retinopathy. (Legally blind in right eye, says she was hospitalized for DKA once) Risk factors for coronary artery disease include diabetes mellitus, dyslipidemia, hypertension, obesity, sedentary lifestyle and tobacco exposure. Current diabetic treatment includes intensive insulin program. She is compliant with treatment most of the time. Her weight is fluctuating dramatically (says she has been heavy all of her life). She is following a generally healthy diet. When  asked about meal planning, she reported none. She has not had a previous visit with a dietitian. She rarely participates in exercise. Her home blood glucose trend is fluctuating dramatically. Her breakfast blood glucose range is generally 130-140 mg/dl. Her lunch blood glucose range is generally 140-180 mg/dl. Her dinner blood glucose range is generally 180-200 mg/dl. (She presents today with her meter and logs showing widely fluctuating glycemic profile.  She is frustrated with her weight today as she has worked hard to adopt a healthier diet since initial visit.  She does have some mild hypoglycemia noted, both fasting and postprandially which may be due to meal quantity or timing.) An ACE inhibitor/angiotensin II receptor blocker is not being taken. She does not see a podiatrist.Eye exam is current.  Hyperlipidemia This is a chronic problem. The current episode started more than 1 year ago. The problem is uncontrolled. Recent lipid tests were reviewed and are high. Exacerbating diseases include chronic renal disease, diabetes and obesity. Factors aggravating her hyperlipidemia include beta blockers and fatty foods. Current antihyperlipidemic treatment includes statins. The current treatment provides moderate improvement of lipids. Compliance problems include adherence to diet and adherence to exercise.  Risk factors for coronary artery disease include diabetes mellitus, dyslipidemia, hypertension, obesity and a sedentary lifestyle.  Hypertension This is a chronic problem. The current episode started more than 1 year ago. The problem has been gradually improving since onset. The problem is uncontrolled. Associated symptoms include blurred vision. There are no associated agents to hypertension. Risk factors for coronary artery disease include diabetes mellitus, dyslipidemia, obesity, sedentary lifestyle and smoking/tobacco exposure. Past treatments include calcium channel blockers, diuretics and central alpha  agonists. The current treatment provides mild improvement. There are no compliance problems.  Hypertensive end-organ damage includes kidney disease, CAD/MI, CVA, heart failure and retinopathy. Identifiable causes of hypertension include chronic renal disease.     Review of systems  Constitutional: + Minimally fluctuating body weight,  current Body mass index is 45.97 kg/m. , + fatigue, no subjective hyperthermia, no subjective hypothermia Eyes: + blurry  vision (diabetic retinopathy and legally blind in Right eye), no xerophthalmia ENT: no sore throat, no nodules palpated in throat, no dysphagia/odynophagia, no hoarseness Cardiovascular: no chest pain, no shortness of breath, no palpitations, no leg swelling Respiratory: no cough, no shortness of breath Gastrointestinal: no nausea/vomiting/diarrhea Genitourinary: + polyuria Musculoskeletal: no muscle/joint aches Skin: no rashes, no hyperemia Neurological: no tremors, no dizziness, + numbness/tingling to BLE R>L Psychiatric: no depression, no anxiety  Objective:     BP (!) 168/81 (BP Location: Right Arm)   Pulse 61   Ht '5\' 4"'$  (1.626 m)   Wt 267 lb 12.8 oz (121.5 kg)   BMI 45.97 kg/m   Wt Readings from Last 3 Encounters:  07/07/20 267 lb 12.8 oz (121.5 kg)  07/06/20 253 lb (114.8 kg)  06/15/20 259 lb 6.4 oz (117.7 kg)    BP Readings from Last 3 Encounters:  07/07/20 (!) 168/81  06/15/20 (!) 163/82  06/16/19 (!) 185/83    Physical Exam- Limited  Constitutional:  Body mass index is 45.97 kg/m., not in acute distress, tearful state of mind Eyes:  EOMI, no exophthalmos Neck: Supple Cardiovascular: RRR, no murmers, rubs, or gallops, mild pitting edema to BLE Respiratory: Adequate breathing efforts, no crackles, rales, rhonchi, or wheezing Musculoskeletal: no gross deformities, strength intact in all four extremities, no gross restriction of joint movements Skin:  no rashes, no hyperemia Neurological: no tremor with  outstretched hands    CMP ( most recent) CMP     Component Value Date/Time   NA 126 (L) 03/04/2019 0616   K 4.6 03/04/2019 0616   CL 96 (L) 03/04/2019 0616   CO2 21 (L) 03/04/2019 0616   GLUCOSE 339 (H) 03/04/2019 0616   BUN 47 (H) 03/04/2019 0616   CREATININE 4.72 (H) 03/04/2019 0616   CALCIUM 7.8 (L) 03/04/2019 0616   PROT 6.8 02/22/2019 1222   ALBUMIN 2.9 (L) 02/22/2019 1222   AST 16 02/22/2019 1222   ALT 13 02/22/2019 1222   ALKPHOS 60 02/22/2019 1222   BILITOT 0.6 02/22/2019 1222   GFRNONAA 10 (L) 03/04/2019 0616   GFRAA 12 (L) 03/04/2019 0616     Diabetic Labs (most recent): Lab Results  Component Value Date   HGBA1C 12.4 05/08/2020   HGBA1C 9.8 (H) 01/19/2019   HGBA1C 10.4 (H) 05/23/2015     Lipid Panel ( most recent) Lipid Panel     Component Value Date/Time   CHOL 245 (H) 05/23/2015 0545   TRIG 278 (H) 05/23/2015 0545   HDL 45 05/23/2015 0545   CHOLHDL 5.4 05/23/2015 0545   VLDL 56 (H) 05/23/2015 0545   LDLCALC 144 (H) 05/23/2015 0545      Lab Results  Component Value Date   TSH 1.524 09/04/2014           Assessment & Plan:   1) Uncontrolled diabetes mellitus with stage 4 chronic kidney disease ([place])  She presents today with her meter and logs showing widely fluctuating glycemic profile.  She is frustrated with her weight today as she has worked hard to adopt a healthier diet since initial visit.  She does have some mild hypoglycemia noted, both fasting and postprandially which may be due to meal quantity or timing.  - Samantha Pierce has currently uncontrolled symptomatic type 2 DM since 50 years of age, with most recent A1c of 12.4 %.   -Recent labs reviewed.  - I had a long discussion with her about the progressive nature of diabetes and the pathology behind  its complications. -her diabetes is complicated by CVA, retinopathy, neuropathy, CKD, and DKA x 1 episode and she remains at a high risk for more acute and chronic complications  which include worsening CAD, CVA, CKD, retinopathy, and neuropathy. These are all discussed in detail with her.  - Nutritional counseling repeated at each appointment due to patients tendency to fall back in to old habits.  - The patient admits there is a room for improvement in their diet and drink choices. -  Suggestion is made for the patient to avoid simple carbohydrates from their diet including Cakes, Sweet Desserts / Pastries, Ice Cream, Soda (diet and regular), Sweet Tea, Candies, Chips, Cookies, Sweet Pastries,  Store Bought Juices, Alcohol in Excess of  1-2 drinks a day, Artificial Sweeteners, Coffee Creamer, and "Sugar-free" Products. This will help patient to have stable blood glucose profile and potentially avoid unintended weight gain.   - I encouraged the patient to switch to  unprocessed or minimally processed complex starch and increased protein intake (animal or plant source), fruits, and vegetables.   - Patient is advised to stick to a routine mealtimes to eat 3 meals  a day and avoid unnecessary snacks ( to snack only to correct hypoglycemia).  - she will be scheduled with Jearld Fenton, RDN, CDE for diabetes education.  - I have approached her with the following individualized plan to manage  her diabetes and patient agrees:   -Given her widely fluctuating glycemic profile with random episodes of hypoglycemia, will not make adjustments to medications today.  She is advised to continue Levemir 80 units SQ daily at bedtime and adjust her Novolog to 10-16 units TID with meals if glucose is above 90 and she is eating.  Specific instructions on how to titrate insulin dose based on glucose readings given to patient in writing.  -she is encouraged to continue monitoring blood glucose 4 times daily, before meals and before bed, and to call the clinic if she has readings less than 70 or greater than 300 for 3 tests in a row.  -She could benefit greatly from CGM device, especially  given her significant visual impairment.  We discussed trying a sample of the Dexcom, however she is in the process of upgrading her phone, so wants to wait on it until it is complete so we can help her with the set up.  - she is warned not to take insulin without proper monitoring per orders. - Adjustment parameters are given to her for hypo and hyperglycemia in writing.  - she is not a candidate for Metformin, SGLT2 due to concurrent renal insufficiency.  - she will be considered for incretin therapy as appropriate next visit.  - Specific targets for  A1c;  LDL, HDL,  and Triglycerides were discussed with the patient.  2) Blood Pressure /Hypertension:  her blood pressure is not controlled to target.   she is advised to continue her current medications including Norvasc 10 mg po daily, Clonidine 0.2 mg po daily, Lasix 80 mg po twice daily, and Metoprolol 50 mg  mg p.o. daily with breakfast.  She says she has a follow up appointment with nephrology in March, however given her weight gain and swelling, I encouraged her to reach out to them for an appointment sooner.  Will defer any med changes to them.  3) Lipids/Hyperlipidemia:    There is no recent lipid panel to review.  She says she thinks she has had her lipids checked at her PCP and will  request they send [place] a copy.  She is advised to continue Lipitor 80 mg po daily before bed.  Side effects and precautions discussed with her.    4)  Weight/Diet:  her Body mass index is 45.97 kg/m.  -  clearly complicating her diabetes care.   she is a candidate for weight loss. I discussed with her the fact that loss of 5 - 10% of her  current body weight will have the most impact on her diabetes management.  Exercise, and detailed carbohydrates information provided  -  detailed on discharge instructions.  5) Chronic Care/Health Maintenance: -she is not on ACEI/ARB and is on Statin medications and is encouraged to initiate and continue to follow up with  Ophthalmology, Dentist, Podiatrist at least yearly or according to recommendations, and advised to QUIT smoking altogether. I have recommended yearly flu vaccine and pneumonia vaccine at least every 5 years; moderate intensity exercise for up to 150 minutes weekly; and sleep for at least 7 hours a day.  - she is advised to maintain close follow up with Rosita Fire, MD for primary care needs, as well as her other providers for optimal and coordinated care.  - Time spent on this patient care encounter:  35 min, of which > 50% was spent in  counseling and the rest reviewing her blood glucose logs , discussing her hypoglycemia and hyperglycemia episodes, reviewing her current and  previous labs / studies  ( including abstraction from other facilities) and medications  doses and developing a  long term treatment plan and documenting her care.   Please refer to Patient Instructions for Blood Glucose Monitoring and Insulin/Medications Dosing Guide"  in media tab for additional information. Please  also refer to " Patient Self Inventory" in the Media  tab for reviewed elements of pertinent patient history.  Samantha Pierce participated in the discussions, expressed understanding, and voiced agreement with the above plans.  All questions were answered to her satisfaction. she is encouraged to contact clinic should she have any questions or concerns prior to her return visit.   Follow up plan: - Return in about 3 months (around 10/04/2020) for Diabetes follow up with A1c in office, Bring glucometer and logs, No previsit labs.  [name], [facility] [address] [place], [place] [address] Phone: [phone] Fax: [phone]  07/07/2020, 3:35 PM

## 2020-07-21 DIAGNOSIS — J449 Chronic obstructive pulmonary disease, unspecified: Secondary | ICD-10-CM | POA: Diagnosis not present

## 2020-07-21 DIAGNOSIS — E113591 Type 2 diabetes mellitus with proliferative diabetic retinopathy without macular edema, right eye: Secondary | ICD-10-CM | POA: Diagnosis not present

## 2020-07-26 DIAGNOSIS — E872 Acidosis: Secondary | ICD-10-CM | POA: Diagnosis not present

## 2020-07-26 DIAGNOSIS — E1122 Type 2 diabetes mellitus with diabetic chronic kidney disease: Secondary | ICD-10-CM | POA: Diagnosis not present

## 2020-07-26 DIAGNOSIS — E211 Secondary hyperparathyroidism, not elsewhere classified: Secondary | ICD-10-CM | POA: Diagnosis not present

## 2020-07-26 DIAGNOSIS — E1129 Type 2 diabetes mellitus with other diabetic kidney complication: Secondary | ICD-10-CM | POA: Diagnosis not present

## 2020-07-26 DIAGNOSIS — R809 Proteinuria, unspecified: Secondary | ICD-10-CM | POA: Diagnosis not present

## 2020-07-26 DIAGNOSIS — I129 Hypertensive chronic kidney disease with stage 1 through stage 4 chronic kidney disease, or unspecified chronic kidney disease: Secondary | ICD-10-CM | POA: Diagnosis not present

## 2020-07-26 DIAGNOSIS — E871 Hypo-osmolality and hyponatremia: Secondary | ICD-10-CM | POA: Diagnosis not present

## 2020-07-26 DIAGNOSIS — N185 Chronic kidney disease, stage 5: Secondary | ICD-10-CM | POA: Diagnosis not present

## 2020-07-26 DIAGNOSIS — E559 Vitamin D deficiency, unspecified: Secondary | ICD-10-CM | POA: Diagnosis not present

## 2020-07-29 ENCOUNTER — Encounter: Payer: Self-pay | Admitting: Nutrition

## 2020-07-29 DIAGNOSIS — R809 Proteinuria, unspecified: Secondary | ICD-10-CM | POA: Diagnosis not present

## 2020-07-29 DIAGNOSIS — E211 Secondary hyperparathyroidism, not elsewhere classified: Secondary | ICD-10-CM | POA: Diagnosis not present

## 2020-07-29 DIAGNOSIS — E1122 Type 2 diabetes mellitus with diabetic chronic kidney disease: Secondary | ICD-10-CM | POA: Diagnosis not present

## 2020-07-29 DIAGNOSIS — I129 Hypertensive chronic kidney disease with stage 1 through stage 4 chronic kidney disease, or unspecified chronic kidney disease: Secondary | ICD-10-CM | POA: Diagnosis not present

## 2020-07-29 DIAGNOSIS — E1129 Type 2 diabetes mellitus with other diabetic kidney complication: Secondary | ICD-10-CM | POA: Diagnosis not present

## 2020-07-29 DIAGNOSIS — N185 Chronic kidney disease, stage 5: Secondary | ICD-10-CM | POA: Diagnosis not present

## 2020-07-29 NOTE — Patient Instructions (Signed)
  Goals Established by Pt  Follow The Renal Plate Method Take insulin as prescribed Eat meals on time. Increase lower carb vegetables. Get A1C down to 8%

## 2020-08-02 DIAGNOSIS — E113591 Type 2 diabetes mellitus with proliferative diabetic retinopathy without macular edema, right eye: Secondary | ICD-10-CM | POA: Diagnosis not present

## 2020-08-02 DIAGNOSIS — H00035 Abscess of left lower eyelid: Secondary | ICD-10-CM | POA: Diagnosis not present

## 2020-08-02 DIAGNOSIS — I10 Essential (primary) hypertension: Secondary | ICD-10-CM | POA: Diagnosis not present

## 2020-08-03 DIAGNOSIS — I129 Hypertensive chronic kidney disease with stage 1 through stage 4 chronic kidney disease, or unspecified chronic kidney disease: Secondary | ICD-10-CM | POA: Diagnosis not present

## 2020-08-03 DIAGNOSIS — E211 Secondary hyperparathyroidism, not elsewhere classified: Secondary | ICD-10-CM | POA: Diagnosis not present

## 2020-08-03 DIAGNOSIS — E1129 Type 2 diabetes mellitus with other diabetic kidney complication: Secondary | ICD-10-CM | POA: Diagnosis not present

## 2020-08-03 DIAGNOSIS — R809 Proteinuria, unspecified: Secondary | ICD-10-CM | POA: Diagnosis not present

## 2020-08-03 DIAGNOSIS — E1122 Type 2 diabetes mellitus with diabetic chronic kidney disease: Secondary | ICD-10-CM | POA: Diagnosis not present

## 2020-08-03 DIAGNOSIS — N185 Chronic kidney disease, stage 5: Secondary | ICD-10-CM | POA: Diagnosis not present

## 2020-08-12 DIAGNOSIS — E559 Vitamin D deficiency, unspecified: Secondary | ICD-10-CM | POA: Diagnosis not present

## 2020-08-12 DIAGNOSIS — I129 Hypertensive chronic kidney disease with stage 1 through stage 4 chronic kidney disease, or unspecified chronic kidney disease: Secondary | ICD-10-CM | POA: Diagnosis not present

## 2020-08-12 DIAGNOSIS — E1122 Type 2 diabetes mellitus with diabetic chronic kidney disease: Secondary | ICD-10-CM | POA: Diagnosis not present

## 2020-08-12 DIAGNOSIS — E1129 Type 2 diabetes mellitus with other diabetic kidney complication: Secondary | ICD-10-CM | POA: Diagnosis not present

## 2020-08-12 DIAGNOSIS — E211 Secondary hyperparathyroidism, not elsewhere classified: Secondary | ICD-10-CM | POA: Diagnosis not present

## 2020-08-12 DIAGNOSIS — R809 Proteinuria, unspecified: Secondary | ICD-10-CM | POA: Diagnosis not present

## 2020-08-12 DIAGNOSIS — N185 Chronic kidney disease, stage 5: Secondary | ICD-10-CM | POA: Diagnosis not present

## 2020-08-13 DIAGNOSIS — Z20822 Contact with and (suspected) exposure to covid-19: Secondary | ICD-10-CM | POA: Diagnosis not present

## 2020-08-17 DIAGNOSIS — Z992 Dependence on renal dialysis: Secondary | ICD-10-CM | POA: Diagnosis not present

## 2020-08-17 DIAGNOSIS — N186 End stage renal disease: Secondary | ICD-10-CM | POA: Diagnosis not present

## 2020-08-17 DIAGNOSIS — E119 Type 2 diabetes mellitus without complications: Secondary | ICD-10-CM | POA: Diagnosis not present

## 2020-08-17 DIAGNOSIS — E785 Hyperlipidemia, unspecified: Secondary | ICD-10-CM | POA: Diagnosis not present

## 2020-08-17 DIAGNOSIS — Z794 Long term (current) use of insulin: Secondary | ICD-10-CM | POA: Diagnosis not present

## 2020-08-18 DIAGNOSIS — Z794 Long term (current) use of insulin: Secondary | ICD-10-CM | POA: Diagnosis not present

## 2020-08-18 DIAGNOSIS — I120 Hypertensive chronic kidney disease with stage 5 chronic kidney disease or end stage renal disease: Secondary | ICD-10-CM | POA: Diagnosis not present

## 2020-08-18 DIAGNOSIS — I69353 Hemiplegia and hemiparesis following cerebral infarction affecting right non-dominant side: Secondary | ICD-10-CM | POA: Diagnosis not present

## 2020-08-18 DIAGNOSIS — Z6841 Body Mass Index (BMI) 40.0 and over, adult: Secondary | ICD-10-CM | POA: Diagnosis not present

## 2020-08-18 DIAGNOSIS — R69 Illness, unspecified: Secondary | ICD-10-CM | POA: Diagnosis not present

## 2020-08-18 DIAGNOSIS — I509 Heart failure, unspecified: Secondary | ICD-10-CM | POA: Diagnosis not present

## 2020-08-18 DIAGNOSIS — N186 End stage renal disease: Secondary | ICD-10-CM | POA: Diagnosis not present

## 2020-08-18 DIAGNOSIS — J449 Chronic obstructive pulmonary disease, unspecified: Secondary | ICD-10-CM | POA: Diagnosis not present

## 2020-08-18 DIAGNOSIS — Z008 Encounter for other general examination: Secondary | ICD-10-CM | POA: Diagnosis not present

## 2020-08-18 DIAGNOSIS — Z992 Dependence on renal dialysis: Secondary | ICD-10-CM | POA: Diagnosis not present

## 2020-08-19 DIAGNOSIS — Z992 Dependence on renal dialysis: Secondary | ICD-10-CM | POA: Diagnosis not present

## 2020-08-19 DIAGNOSIS — N186 End stage renal disease: Secondary | ICD-10-CM | POA: Diagnosis not present

## 2020-08-21 DIAGNOSIS — N186 End stage renal disease: Secondary | ICD-10-CM | POA: Diagnosis not present

## 2020-08-21 DIAGNOSIS — Z992 Dependence on renal dialysis: Secondary | ICD-10-CM | POA: Diagnosis not present

## 2020-08-23 DIAGNOSIS — R69 Illness, unspecified: Secondary | ICD-10-CM | POA: Diagnosis not present

## 2020-08-23 DIAGNOSIS — F411 Generalized anxiety disorder: Secondary | ICD-10-CM | POA: Diagnosis not present

## 2020-08-24 DIAGNOSIS — N2581 Secondary hyperparathyroidism of renal origin: Secondary | ICD-10-CM | POA: Diagnosis not present

## 2020-08-24 DIAGNOSIS — N186 End stage renal disease: Secondary | ICD-10-CM | POA: Diagnosis not present

## 2020-08-24 DIAGNOSIS — Z992 Dependence on renal dialysis: Secondary | ICD-10-CM | POA: Diagnosis not present

## 2020-08-26 DIAGNOSIS — Z992 Dependence on renal dialysis: Secondary | ICD-10-CM | POA: Diagnosis not present

## 2020-08-26 DIAGNOSIS — N186 End stage renal disease: Secondary | ICD-10-CM | POA: Diagnosis not present

## 2020-08-27 DIAGNOSIS — Z992 Dependence on renal dialysis: Secondary | ICD-10-CM | POA: Diagnosis not present

## 2020-08-27 DIAGNOSIS — N186 End stage renal disease: Secondary | ICD-10-CM | POA: Diagnosis not present

## 2020-08-30 DIAGNOSIS — N186 End stage renal disease: Secondary | ICD-10-CM | POA: Diagnosis not present

## 2020-08-30 DIAGNOSIS — D509 Iron deficiency anemia, unspecified: Secondary | ICD-10-CM | POA: Diagnosis not present

## 2020-08-30 DIAGNOSIS — Z992 Dependence on renal dialysis: Secondary | ICD-10-CM | POA: Diagnosis not present

## 2020-08-31 ENCOUNTER — Other Ambulatory Visit: Payer: Self-pay | Admitting: *Deleted

## 2020-08-31 ENCOUNTER — Telehealth: Payer: Self-pay | Admitting: *Deleted

## 2020-08-31 NOTE — Telephone Encounter (Signed)
Spoke with [employer]. States pt has been unable to complete dialysis and likely appropriate for a fistulogram. Added patient on for fistulogram Thursday.

## 2020-08-31 NOTE — Telephone Encounter (Signed)
Pt states she was unable to dialyze yesterday and has been having trouble with her fistula for the last 3 dialysis sessions. Pt states we should be receiving a referral from her nephrologist. We will contact her about an appt when we receive the referral.

## 2020-09-01 DIAGNOSIS — Z992 Dependence on renal dialysis: Secondary | ICD-10-CM | POA: Diagnosis not present

## 2020-09-01 DIAGNOSIS — N186 End stage renal disease: Secondary | ICD-10-CM | POA: Diagnosis not present

## 2020-09-02 ENCOUNTER — Other Ambulatory Visit: Payer: Self-pay

## 2020-09-02 ENCOUNTER — Encounter (HOSPITAL_COMMUNITY)
Admission: RE | Disposition: A | Discharge status: Home / Self Care | Payer: Self-pay | Source: Home / Self Care | Attending: Vascular Surgery

## 2020-09-02 ENCOUNTER — Encounter (HOSPITAL_COMMUNITY): Payer: Self-pay | Admitting: Vascular Surgery

## 2020-09-02 ENCOUNTER — Ambulatory Visit (HOSPITAL_COMMUNITY)
Admission: RE | Admit: 2020-09-02 | Discharge: 2020-09-02 | Disposition: A | Discharge status: Home / Self Care | Payer: Medicare HMO | Attending: Vascular Surgery | Admitting: Vascular Surgery

## 2020-09-02 DIAGNOSIS — Z79899 Other long term (current) drug therapy: Secondary | ICD-10-CM | POA: Diagnosis not present

## 2020-09-02 DIAGNOSIS — Z880 Allergy status to penicillin: Secondary | ICD-10-CM | POA: Diagnosis not present

## 2020-09-02 DIAGNOSIS — T82898A Other specified complication of vascular prosthetic devices, implants and grafts, initial encounter: Secondary | ICD-10-CM

## 2020-09-02 DIAGNOSIS — T82858A Stenosis of vascular prosthetic devices, implants and grafts, initial encounter: Secondary | ICD-10-CM | POA: Diagnosis not present

## 2020-09-02 DIAGNOSIS — Z91041 Radiographic dye allergy status: Secondary | ICD-10-CM | POA: Insufficient documentation

## 2020-09-02 DIAGNOSIS — N186 End stage renal disease: Secondary | ICD-10-CM | POA: Insufficient documentation

## 2020-09-02 DIAGNOSIS — Z20822 Contact with and (suspected) exposure to covid-19: Secondary | ICD-10-CM | POA: Insufficient documentation

## 2020-09-02 DIAGNOSIS — I120 Hypertensive chronic kidney disease with stage 5 chronic kidney disease or end stage renal disease: Secondary | ICD-10-CM | POA: Insufficient documentation

## 2020-09-02 DIAGNOSIS — Y841 Kidney dialysis as the cause of abnormal reaction of the patient, or of later complication, without mention of misadventure at the time of the procedure: Secondary | ICD-10-CM | POA: Insufficient documentation

## 2020-09-02 DIAGNOSIS — E1122 Type 2 diabetes mellitus with diabetic chronic kidney disease: Secondary | ICD-10-CM | POA: Insufficient documentation

## 2020-09-02 DIAGNOSIS — Z992 Dependence on renal dialysis: Secondary | ICD-10-CM | POA: Diagnosis not present

## 2020-09-02 DIAGNOSIS — F17210 Nicotine dependence, cigarettes, uncomplicated: Secondary | ICD-10-CM | POA: Insufficient documentation

## 2020-09-02 DIAGNOSIS — Z794 Long term (current) use of insulin: Secondary | ICD-10-CM | POA: Diagnosis not present

## 2020-09-02 HISTORY — PX: PERIPHERAL VASCULAR BALLOON ANGIOPLASTY: CATH118281

## 2020-09-02 LAB — POCT I-STAT, CHEM 8
BUN: 44 mg/dL — ABNORMAL HIGH (ref 6–20)
Calcium, Ion: 1.11 mmol/L — ABNORMAL LOW (ref 1.15–1.40)
Chloride: 101 mmol/L (ref 98–111)
Creatinine, Ser: 5.8 mg/dL — ABNORMAL HIGH (ref 0.44–1.00)
Glucose, Bld: 63 mg/dL — ABNORMAL LOW (ref 70–99)
HCT: 33 % — ABNORMAL LOW (ref 36.0–46.0)
Hemoglobin: 11.2 g/dL — ABNORMAL LOW (ref 12.0–15.0)
Potassium: 4.4 mmol/L (ref 3.5–5.1)
Sodium: 139 mmol/L (ref 135–145)
TCO2: 27 mmol/L (ref 22–32)

## 2020-09-02 LAB — GLUCOSE, CAPILLARY
Glucose-Capillary: 82 mg/dL (ref 70–99)
Glucose-Capillary: 65 mg/dL — ABNORMAL LOW (ref 70–99)

## 2020-09-02 LAB — PREGNANCY, URINE: Preg Test, Ur: NEGATIVE

## 2020-09-02 LAB — SARS CORONAVIRUS 2 BY RT PCR (HOSPITAL ORDER, PERFORMED IN ~~LOC~~ HOSPITAL LAB): SARS Coronavirus 2: NEGATIVE

## 2020-09-02 SURGERY — PERIPHERAL VASCULAR BALLOON ANGIOPLASTY
Anesthesia: LOCAL | Laterality: Left

## 2020-09-02 MED ORDER — HEPARIN (PORCINE) IN NACL 1000-0.9 UT/500ML-% IV SOLN
INTRAVENOUS | Status: AC
  Filled 2020-09-02: qty 500

## 2020-09-02 MED ORDER — HEPARIN (PORCINE) IN NACL 1000-0.9 UT/500ML-% IV SOLN
INTRAVENOUS | Status: DC | PRN
  Administered 2020-09-02: 500 mL

## 2020-09-02 MED ORDER — DEXTROSE 50 % IV SOLN
INTRAVENOUS | Status: AC
  Administered 2020-09-02: 25 mL via INTRAVENOUS
  Filled 2020-09-02: qty 50

## 2020-09-02 MED ORDER — HEPARIN SODIUM (PORCINE) 1000 UNIT/ML IJ SOLN
INTRAMUSCULAR | Status: AC
  Filled 2020-09-02: qty 1

## 2020-09-02 MED ORDER — DEXTROSE 50 % IV SOLN: 25.0000 mL | Freq: Once | INTRAVENOUS | Status: AC

## 2020-09-02 MED ORDER — DIPHENHYDRAMINE HCL 50 MG/ML IJ SOLN
25.0000 mg | INTRAMUSCULAR | Status: AC
  Administered 2020-09-02: 25 mg via INTRAVENOUS
  Filled 2020-09-02: qty 1

## 2020-09-02 MED ORDER — HEPARIN SODIUM (PORCINE) 1000 UNIT/ML IJ SOLN
INTRAMUSCULAR | Status: DC | PRN
  Administered 2020-09-02: 5000 [IU] via INTRAVENOUS

## 2020-09-02 MED ORDER — FAMOTIDINE IN NACL 20-0.9 MG/50ML-% IV SOLN
20.0000 mg | INTRAVENOUS | Status: AC
  Administered 2020-09-02: 20 mg via INTRAVENOUS
  Filled 2020-09-02: qty 50

## 2020-09-02 MED ORDER — LIDOCAINE HCL (PF) 1 % IJ SOLN
INTRAMUSCULAR | Status: DC | PRN
  Administered 2020-09-02: 5 mL via INTRADERMAL

## 2020-09-02 MED ORDER — IODIXANOL 320 MG/ML IV SOLN
INTRAVENOUS | Status: DC | PRN
  Administered 2020-09-02: 32 mL via INTRAVENOUS

## 2020-09-02 MED ORDER — SODIUM CHLORIDE 0.9% FLUSH: 3.0000 mL | Freq: Two times a day (BID) | INTRAVENOUS | Status: DC

## 2020-09-02 MED ORDER — SODIUM CHLORIDE 0.9% FLUSH: 3.0000 mL | INTRAVENOUS | Status: DC | PRN

## 2020-09-02 MED ORDER — SODIUM CHLORIDE 0.9 % IV SOLN: 250.0000 mL | INTRAVENOUS | Status: DC | PRN

## 2020-09-02 MED ORDER — METHYLPREDNISOLONE SODIUM SUCC 125 MG IJ SOLR
125.0000 mg | INTRAMUSCULAR | Status: AC
  Administered 2020-09-02: 125 mg via INTRAVENOUS
  Filled 2020-09-02: qty 2

## 2020-09-02 SURGICAL SUPPLY — 19 items
BAG SNAP BAND KOVER 36X36 (MISCELLANEOUS) ×2 IMPLANT
BALLN IN.PACT DCB 7X40 (BALLOONS) ×2
BALLN MUSTANG 5.0X40 75 (BALLOONS) ×2
BALLN MUSTANG 7.0X40 75 (BALLOONS) ×2
BALLOON MUSTANG 5.0X40 75 (BALLOONS) ×1 IMPLANT
BALLOON MUSTANG 7.0X40 75 (BALLOONS) ×1 IMPLANT
COVER DOME SNAP 22 D (MISCELLANEOUS) ×2 IMPLANT
DCB IN.PACT 7X40 (BALLOONS) ×1 IMPLANT
KIT ENCORE 26 ADVANTAGE (KITS) ×2 IMPLANT
KIT MICROPUNCTURE NIT STIFF (SHEATH) ×2 IMPLANT
PROTECTION STATION PRESSURIZED (MISCELLANEOUS) ×2
SHEATH PINNACLE R/O II 6F 4CM (SHEATH) ×2 IMPLANT
SHEATH PROBE COVER 6X72 (BAG) ×2 IMPLANT
STATION PROTECTION PRESSURIZED (MISCELLANEOUS) ×1 IMPLANT
STOPCOCK MORSE 400PSI 3WAY (MISCELLANEOUS) ×2 IMPLANT
TRAY PV CATH (CUSTOM PROCEDURE TRAY) ×2 IMPLANT
TUBING CIL FLEX 10 FLL-RA (TUBING) ×2 IMPLANT
WIRE BENTSON .035X145CM (WIRE) ×2 IMPLANT
WIRE ROSEN-J .035X260CM (WIRE) ×2 IMPLANT

## 2020-09-02 NOTE — H&P (Signed)
H&P   History of Present Illness: This is a 50 y.o. female with multiple medical problems including end-stage renal disease on dialysis Monday Wednesday Friday that presents for left arm fistulogram.  She has had malfunction of her left basilic vein fistula that Dr. Donzetta Matters placed in 2020.  She states this has been ongoing since Friday.  Up until Friday this worked very well.  States she has not had dialysis since Friday.  Past Medical History:  Diagnosis Date  . Anemia   . Anxiety   . Arthritis   . Asystole ([place])    a. 05/2015: pt developed bradycardia->asystole during [place] nuclear stress test, s/p brief code. Cath with only 30% prox LCx. Her asystole during Lexiscan infusion was felt to represent an excessive pharmacologic response to the agent and likely superimposed vasovagal response.  . Bipolar disorder ([place])   . CKD (chronic kidney disease) stage 4, GFR 15-29 ml/min (HCC)   . COPD (chronic obstructive pulmonary disease) ([place])   . Depression   . Essential hypertension   . GERD (gastroesophageal reflux disease)   . History of blood transfusion   . History of seizure 2017  . History of stroke April 2016   Acute lacunar infarct of the left thalamus  . Leg cramps   . Mild CAD    a. LHC 05/2015: 30% prox Cx, otherwise widely patent.  . Morbid obesity ([place])   . Neuropathy   . PTSD (post-traumatic stress disorder)   . Type 2 diabetes mellitus ([place])     Past Surgical History:  Procedure Laterality Date  . AV FISTULA PLACEMENT Right 01/08/2017   Procedure: RIGHT ARM BRACHIOCEPHALIC ARTERIOVENOUS (AV) FISTULA CREATION;  Surgeon: Waynetta Sandy, MD;  Location: [facility];  Service: Vascular;  Laterality: Right;  . AV FISTULA PLACEMENT Left 11/20/2018   Procedure: Creation of LEFT ARM ARTERIOVENOUS (AV) FISTULA;  Surgeon: Waynetta Sandy, MD;  Location: [facility];  Service: Vascular;  Laterality: Left;  . BASCILIC VEIN TRANSPOSITION Left 02/11/2019   Procedure: BASILIC VEIN  TRANSPOSITION SECOND STAGE;  Surgeon: Waynetta Sandy, MD;  Location: [facility];  Service: Vascular;  Laterality: Left;  . CARDIAC CATHETERIZATION N/A 06/01/2015   Procedure: Left Heart Cath and Coronary Angiography;  Surgeon: Belva Crome, MD; CFX 30%, no other dz, EF nl by echo  . CESAREAN SECTION     x2  . DILATION AND CURETTAGE OF UTERUS    . EYE SURGERY     Right eye pars plano vitrectomy   . HEMATOMA EVACUATION Left 03/03/2019   Procedure: EVACUATION HEMATOMA;  Surgeon: Elam Dutch, MD;  Location: [facility];  Service: Vascular;  Laterality: Left;  . LASIK    . PARS PLANA VITRECTOMY  04/20/2011   Procedure: PARS PLANA VITRECTOMY WITH 25 GAUGE;  Surgeon: Hayden Pedro, MD;  Location: [facility];  Service: Ophthalmology;  Laterality: Left;  membrane peel, gas fluid exchange, endolaser, repair of complex retinal detachment  . TUBAL LIGATION      Allergies  Allergen Reactions  . Contrast Media [Iodinated Diagnostic Agents] Anaphylaxis    "code blue--I died"  . Penicillins Rash and Other (See Comments)    Has patient had a PCN reaction causing immediate rash, facial/tongue/throat swelling, SOB or lightheadedness with hypotension: Yes Has patient had a PCN reaction causing severe rash involving mucus membranes or skin necrosis: No Has patient had a PCN reaction that required hospitalization No Has patient had a PCN reaction occurring within the last 10 years: No  If all of the above answers are "NO", then may proceed with Cephalosporin use.     Prior to Admission medications   Medication Sig Start Date End Date Taking? Authorizing Provider  albuterol (PROVENTIL HFA;VENTOLIN HFA) 108 (90 BASE) MCG/ACT inhaler Inhale 1-2 puffs into the lungs every 6 (six) hours as needed for wheezing or shortness of breath. Patient taking differently: Inhale 1-2 puffs into the lungs every 6 (six) hours as needed for shortness of breath. 06/20/14  Yes Nat Christen, MD  amLODipine (NORVASC) 10 MG tablet  Take 1 tablet (10 mg total) by mouth daily. 01/21/19  Yes Tat, Shanon Brow, MD  atorvastatin (LIPITOR) 80 MG tablet Take 1 tablet (80 mg total) by mouth daily at 6 PM. Patient taking differently: Take 80 mg by mouth every morning. 08/05/15  Yes Satira Sark, MD  cholecalciferol (VITAMIN D3) 25 MCG (1000 UNIT) tablet Take 3,000 Units by mouth daily.   Yes [provider]  cloNIDine (CATAPRES) 0.2 MG tablet Take 0.2 mg by mouth daily.   Yes [provider]  FLUoxetine (PROZAC) 10 MG tablet Take 10 mg by mouth daily.   Yes [provider]  FLUoxetine (PROZAC) 20 MG capsule Take 20 mg by mouth daily.   Yes [provider]  furosemide (LASIX) 80 MG tablet Take 80 mg by mouth 2 (two) times daily. 09/25/19  Yes [provider]  gabapentin (NEURONTIN) 300 MG capsule Take 300 mg by mouth at bedtime.  02/07/18  Yes [provider]  hydrALAZINE (APRESOLINE) 50 MG tablet Take 50 mg by mouth 2 (two) times daily. 06/16/20  Yes [provider]  insulin aspart (NOVOLOG) 100 UNIT/ML FlexPen Inject 10-16 Units into the skin 3 (three) times daily before meals. Sliding scale   Yes [provider]  Insulin Detemir (LEVEMIR) 100 UNIT/ML Pen Inject 50 Units into the skin at bedtime. Patient taking differently: Inject 80 Units into the skin at bedtime. 07/30/18  Yes Nida, Marella Chimes, MD  metoprolol succinate (TOPROL-XL) 50 MG 24 hr tablet Take 50 mg by mouth daily. 10/11/18  Yes [provider]  OLANZapine (ZYPREXA) 2.5 MG tablet Take 2.5 mg by mouth daily. 06/18/20  Yes [provider]  omeprazole (PRILOSEC) 20 MG capsule Take 20 mg by mouth daily before breakfast.  02/07/18  Yes [provider]  traZODone (DESYREL) 100 MG tablet Take 100 mg by mouth at bedtime.   Yes [provider]    Social History   Socioeconomic History  . Marital status: Single    Spouse name: Not on file  . Number of children: Not on file  .  Years of education: Not on file  . Highest education level: Not on file  Occupational History  . Not on file  Tobacco Use  . Smoking status: Current Some Day Smoker    Packs/day: 0.03    Years: 30.00    Pack years: 0.90    Types: Cigarettes    Start date: 06/05/1981    Last attempt to quit: 12/04/2018    Years since quitting: 1.7  . Smokeless tobacco: Never Used  Vaping Use  . Vaping Use: Never used  Substance and Sexual Activity  . Alcohol use: Yes    Alcohol/week: 0.0 standard drinks    Comment: occ wine  . Drug use: No  . Sexual activity: Yes    Birth control/protection: Surgical  Other Topics Concern  . Not on file  Social History Narrative  . Not on file  Social Determinants of Health   Financial Resource Strain: Not on file  Food Insecurity: Not on file  Transportation Needs: Not on file  Physical Activity: Not on file  Stress: Not on file  Social Connections: Not on file  Intimate Partner Violence: Not on file     Family History  Problem Relation Age of Onset  . Diabetes Mother   . Kidney failure Mother   . Diabetes Father   . Amblyopia Neg Hx   . Blindness Neg Hx   . Cataracts Neg Hx   . Glaucoma Neg Hx   . Macular degeneration Neg Hx   . Retinal detachment Neg Hx   . Strabismus Neg Hx   . Retinitis pigmentosa Neg Hx     ROS: '[x]'$  Positive   '[ ]'$  Negative   '[ ]'$  All sytems reviewed and are negative  Cardiovascular: '[]'$  chest pain/pressure '[]'$  palpitations '[]'$  SOB lying flat '[]'$  DOE '[]'$  pain in legs while walking '[]'$  pain in legs at rest '[]'$  pain in legs at night '[]'$  non-healing ulcers '[]'$  hx of DVT '[]'$  swelling in legs  Pulmonary: '[]'$  productive cough '[]'$  asthma/wheezing '[]'$  home O2  Neurologic: '[]'$  weakness in '[]'$  arms '[]'$  legs '[]'$  numbness in '[]'$  arms '[]'$  legs '[]'$  hx of CVA '[]'$  mini stroke '[]'$ difficulty speaking or slurred speech '[]'$  temporary loss of vision in one eye '[]'$  dizziness  Hematologic: '[]'$  hx of cancer '[]'$  bleeding problems '[]'$  problems with  blood clotting easily  Endocrine:   '[]'$  diabetes '[]'$  thyroid disease  GI '[]'$  vomiting blood '[]'$  blood in stool  GU: '[]'$  CKD/renal failure '[]'$  HD--'[]'$  M/W/F or '[]'$  T/T/S '[]'$  burning with urination '[]'$  blood in urine  Psychiatric: '[]'$  anxiety '[]'$  depression  Musculoskeletal: '[]'$  arthritis '[]'$  joint pain  Integumentary: '[]'$  rashes '[]'$  ulcers  Constitutional: '[]'$  fever '[]'$  chills   Physical Examination  Vitals:   09/02/20 0747  BP: (!) 163/96  Pulse: 69  Resp: 18  Temp: 97.8 F (36.6 C)  SpO2: 95%   Body mass index is 45.3 kg/m.  General:  NAD Gait: Not observed HENT: WNL, normocephalic Pulmonary: normal non-labored breathing, without Rales, rhonchi,  wheezing Cardiac: regular, without  Murmurs, rubs or gallops Vascular Exam/Pulses: Left basilic vein fistula feels like a thrill at the antecubitum and more pulsatile in the mid to proximal upper arm   CBC    Component Value Date/Time   WBC 13.1 (H) 03/02/2019 2252   RBC 3.24 (L) 03/02/2019 2252   HGB 11.2 (L) 09/02/2020 0954   HCT 33.0 (L) 09/02/2020 0954   PLT 312 03/02/2019 2252   MCV 86.1 03/02/2019 2252   MCH 25.9 (L) 03/02/2019 2252   MCHC 30.1 03/02/2019 2252   RDW 13.1 03/02/2019 2252   LYMPHSABS 0.8 03/02/2019 2252   MONOABS 1.8 (H) 03/02/2019 2252   EOSABS 0.1 03/02/2019 2252   BASOSABS 0.1 03/02/2019 2252    BMET    Component Value Date/Time   NA 139 09/02/2020 0954   K 4.4 09/02/2020 0954   CL 101 09/02/2020 0954   CO2 21 (L) 03/04/2019 0616   GLUCOSE 63 (L) 09/02/2020 0954   BUN 44 (H) 09/02/2020 0954   CREATININE 5.80 (H) 09/02/2020 0954   CALCIUM 7.8 (L) 03/04/2019 0616   GFRNONAA 10 (L) 03/04/2019 0616   GFRAA 12 (L) 03/04/2019 0616    COAGS: Lab Results  Component Value Date   INR 0.97 06/01/2015   INR 1.00 06/01/2015   INR 0.97 05/22/2015  Non-Invasive Vascular Imaging:    None   ASSESSMENT/PLAN: This is a 50 y.o. female with end-stage renal disease on dialysis Monday  Wednesday Friday that presents with malfunction of left arm AV fistula.  Discussed plans for left arm fistulogram.  She has gotten prophylaxis for her contrast allergy.  Risks and benefits discussed.     Marty Heck, MD [facility] Office: [place]

## 2020-09-02 NOTE — Progress Notes (Signed)
Discharge instructions reviewed with pt voices understanding.  

## 2020-09-02 NOTE — Discharge Instructions (Signed)
Dialysis Fistulogram  A dialysis fistulogram is an X-ray test to look inside the vascular access site. This is the area where blood is removed and returned to your body during dialysis. Fistulas and grafts provide easy and effective access for dialysis. However, sometimes problems can occur. The fistula or graft can become clogged or narrow. This can make dialysis less effective. You may need to have a fistulogram to check for these problems. Tell a health care provider about:  Any allergies you have, including any reactions you have had to contrast dye.  All medicines you are taking, including vitamins, herbs, eye drops, creams, and over-the-counter medicines.  Any problems you or family members have had with anesthetic medicines.  Any blood disorders you have.  Any surgeries you have had.  Any medical conditions you have.  Whether you are pregnant or may be pregnant.  Any warmth, pain, redness, or swelling in the limb that has the fistula or graft.  Any bleeding from your fistula or graft.  Any of the following in the area where the fistula or graft leads: ? Numbness. ? Tingling. ? A cold feeling. ? Bluish or pale white color. What are the risks? Generally, this is a safe procedure. However, problems may occur, including:  Infection.  Bleeding.  Allergic reactions to medicines or dyes.  Damage to other structures, such as nearby nerves or blood vessels.  A blood clot in the fistula or graft.  A blood clot that travels to your lungs (pulmonary embolism). What happens before the procedure? Medicines Ask your health care provider about:  Changing or stopping your regular medicines. This is especially important if you are taking diabetes medicines or blood thinners (anticoagulants).  Taking medicines such as aspirin and ibuprofen. These medicines can thin your blood. Do not take these medicines unless your health care provider tells you to take them.  Taking  over-the-counter medicines, vitamins, herbs, and supplements. General instructions  Follow instructions from your health care provider about eating and drinking restrictions.  Plan to have a responsible adult take you home from the hospital or clinic.  Plan to have a responsible adult care for you for the time you are told after you leave the hospital or clinic. This is important.  You may have a blood or urine sample taken. These will help your health care provider learn how well your kidneys and liver are working and how well your blood clots.  Ask your health care provider what steps will be taken to help prevent infection. What happens during the procedure?  An IV will be inserted into one of your veins.  You may be connected to monitors that track your heart rate, blood pressure, oxygen level, and pulse.  You will be given one or more of the following: ? A medicine to help you relax (sedative). ? A medicine to numb the area (local anesthetic) on the limb with your fistula or graft.  The area of your body where the catheter is to be inserted will be sterilized and covered with a surgical drape.  The health care provider will make a very small incision at the vascular access site.  A sheath or short tube will be inserted into the fistula or graft.  The catheter will be inserted through the sheath and advanced until it reaches the area of possible problems.  Dye, also called contrast material,will be injected into the catheter. An angiogram or X-rays will be taken to help identify the site of the blockage.  If a blockage or narrowing is found, your health care provider can do treatments during the procedure to clear the problem. Treatments to open a blocked or narrowed fistula or graft may include: ? Using a device to remove the clot from the fistula or graft. ? Using a balloon (angioplasty) to open or widen the blocked area. ? Placing a small mesh tube (stent) to keep the  fistula or graft open. ? Injecting a medicine to dissolve the clot (thrombolysis).  When the procedure is complete, the catheter will be removed and pressure will be applied to stop any bleeding.  In some cases, the catheter site will be closed with stitches (sutures) underneath the skin.  Your skin will be covered with a bandage (dressing) and the IV will be removed. The procedure may vary among health care providers and hospitals. What happens after the procedure?  Your blood pressure, heart rate, breathing rate, and blood oxygen level will be monitored until you leave the hospital or clinic. Your health care provider will also monitor your vascular access site.  You can expect to wait in a recovery area for several hours.  If you were given a sedative during the procedure, it can affect you for several hours. Do not drive or operate machinery until your health care provider says that it is safe. Summary  A dialysis fistulogram is an X-ray test to look inside the vascular access site. This is the area where blood is removed and returned to your body during dialysis.  Before the procedure, ask your health care provider if you should change or stop any medicines you are taking.  If a problem is found during a fistulogram, your health care provider can also do treatments to clear the blocked or narrowed areas. This information is not intended to replace advice given to you by your health care provider. Make sure you discuss any questions you have with your health care provider. Document Revised: 12/31/2019 Document Reviewed: 12/31/2019 Elsevier Patient Education  [place].

## 2020-09-02 NOTE — Op Note (Signed)
    OPERATIVE NOTE   PROCEDURE: 1. left brachiobasilic arteriovenous fistula cannulation under ultrasound guidance 2. left arm fistulogram including central venogram 3. left peripheral angioplasty of basilic vein fistula (5 mm x 40 mm Mustang, 7 mm x 40 mm Mustang, and 7 mm x 40 mm drug coated Impact)   PRE-OPERATIVE DIAGNOSIS: Malfunctioning left arteriovenous fistula  POST-OPERATIVE DIAGNOSIS: same as above   SURGEON: Marty Heck, MD  ANESTHESIA: local  ESTIMATED BLOOD LOSS: 5 cc  FINDING(S): 1. Left arm brachiobasilic fistula was pulsatile and accessed with [place].  Fistulogram showed no evidence of central stenosis.  In the mid upper arm there was a high-grade stenosis greater than 90 to 95% stenosis over short segment of about 20 mm.  This was crossed and then treated with a 5 x 40 Mustang, 7 x 40 Mustang, and 7 x 40 drug-coated Impact with excellent results and less than 30% stenosis at completion.  Good thrill at completion.  SPECIMEN(S):  None  CONTRAST: 32 mL  INDICATIONS: Samantha Pierce is a 50 y.o. female who  presents with malfunctioning left brachiobasilic arteriovenous fistula.  The patient is scheduled for left arm fistulogram.  The patient is aware the risks include but are not limited to: bleeding, infection, thrombosis of the cannulated access, and possible anaphylactic reaction to the contrast.  The patient is aware of the risks of the procedure and elects to proceed forward.  DESCRIPTION: After full informed written consent was obtained, the patient was brought back to the angiography suite and placed supine upon the angiography table.  The patient was connected to monitoring equipment.  The left arm was prepped and draped in the standard fashion for a left arm fistulogram.  Under ultrasound guidance, the left arteriovenous fistula was evaluated, it was patent, an image was saved.  It was cannulated with a micropuncture needle.  The microwire was advanced  into the fistula and the needle was exchanged for the a microsheath, which was lodged 2 cm into the access.  The wire was removed and the sheath was connected to the IV extension tubing.  Hand injections were completed to image the access from the antecubitum up to the level of axilla.  The central venous structures were also imaged by hand injections.  Based on the images, this patient will need: peripheral intervention on basilic vein stenosis.  I then placed a Benson wire in the micro sheath and exchanged for a short 6 [place] sheath.  Patient was given 5000 units of IV heparin.  I then crossed the lesion and this was then treated with a 5 x 40 Mustang to nominal pressure for 2 minutes initially with good results.  We did get a retrograde shot that showed some tortuosity by the arterial anastomosis but no stenosis.  Then upsized to a 7 x 40 Mustang to nominal pressure for 2 minutes and then a 7 x 40 drug-coated Impact to nominal pressure for 3 minutes.  Excellent results with good thrill.    A 4-0 Monocryl purse-string suture was sewn around the sheath.  The sheath was removed while tying down the suture.  A sterile bandage was applied to the puncture site.  COMPLICATIONS: None  CONDITION: Stable  Marty Heck, MD [facility] Office: [place]   09/02/2020 11:21 AM

## 2020-09-03 DIAGNOSIS — Z23 Encounter for immunization: Secondary | ICD-10-CM | POA: Diagnosis not present

## 2020-09-03 DIAGNOSIS — Z992 Dependence on renal dialysis: Secondary | ICD-10-CM | POA: Diagnosis not present

## 2020-09-03 DIAGNOSIS — N186 End stage renal disease: Secondary | ICD-10-CM | POA: Diagnosis not present

## 2020-09-06 DIAGNOSIS — N186 End stage renal disease: Secondary | ICD-10-CM | POA: Diagnosis not present

## 2020-09-06 DIAGNOSIS — Z992 Dependence on renal dialysis: Secondary | ICD-10-CM | POA: Diagnosis not present

## 2020-09-06 DIAGNOSIS — Z23 Encounter for immunization: Secondary | ICD-10-CM | POA: Diagnosis not present

## 2020-09-08 DIAGNOSIS — E559 Vitamin D deficiency, unspecified: Secondary | ICD-10-CM | POA: Diagnosis not present

## 2020-09-08 DIAGNOSIS — Z992 Dependence on renal dialysis: Secondary | ICD-10-CM | POA: Diagnosis not present

## 2020-09-08 DIAGNOSIS — Z23 Encounter for immunization: Secondary | ICD-10-CM | POA: Diagnosis not present

## 2020-09-08 DIAGNOSIS — N186 End stage renal disease: Secondary | ICD-10-CM | POA: Diagnosis not present

## 2020-09-10 DIAGNOSIS — N186 End stage renal disease: Secondary | ICD-10-CM | POA: Diagnosis not present

## 2020-09-10 DIAGNOSIS — Z23 Encounter for immunization: Secondary | ICD-10-CM | POA: Diagnosis not present

## 2020-09-10 DIAGNOSIS — Z992 Dependence on renal dialysis: Secondary | ICD-10-CM | POA: Diagnosis not present

## 2020-09-13 DIAGNOSIS — Z992 Dependence on renal dialysis: Secondary | ICD-10-CM | POA: Diagnosis not present

## 2020-09-13 DIAGNOSIS — N186 End stage renal disease: Secondary | ICD-10-CM | POA: Diagnosis not present

## 2020-09-13 DIAGNOSIS — Z23 Encounter for immunization: Secondary | ICD-10-CM | POA: Diagnosis not present

## 2020-09-14 ENCOUNTER — Ambulatory Visit: Payer: Medicare HMO | Admitting: Nurse Practitioner

## 2020-09-15 DIAGNOSIS — Z992 Dependence on renal dialysis: Secondary | ICD-10-CM | POA: Diagnosis not present

## 2020-09-15 DIAGNOSIS — Z23 Encounter for immunization: Secondary | ICD-10-CM | POA: Diagnosis not present

## 2020-09-15 DIAGNOSIS — N186 End stage renal disease: Secondary | ICD-10-CM | POA: Diagnosis not present

## 2020-09-15 NOTE — Patient Instructions (Signed)

## 2020-09-16 ENCOUNTER — Other Ambulatory Visit: Payer: Self-pay

## 2020-09-16 ENCOUNTER — Ambulatory Visit (INDEPENDENT_AMBULATORY_CARE_PROVIDER_SITE_OTHER): Payer: Medicare HMO | Admitting: Nurse Practitioner

## 2020-09-16 ENCOUNTER — Encounter: Payer: Self-pay | Admitting: Nurse Practitioner

## 2020-09-16 VITALS — BP 149/67 | HR 76 | Ht 64.0 in | Wt 262.0 lb

## 2020-09-16 DIAGNOSIS — I10 Essential (primary) hypertension: Secondary | ICD-10-CM

## 2020-09-16 DIAGNOSIS — E1122 Type 2 diabetes mellitus with diabetic chronic kidney disease: Secondary | ICD-10-CM | POA: Diagnosis not present

## 2020-09-16 DIAGNOSIS — N184 Chronic kidney disease, stage 4 (severe): Secondary | ICD-10-CM | POA: Diagnosis not present

## 2020-09-16 DIAGNOSIS — E782 Mixed hyperlipidemia: Secondary | ICD-10-CM

## 2020-09-16 DIAGNOSIS — IMO0002 Reserved for concepts with insufficient information to code with codable children: Secondary | ICD-10-CM

## 2020-09-16 DIAGNOSIS — E1165 Type 2 diabetes mellitus with hyperglycemia: Secondary | ICD-10-CM

## 2020-09-16 NOTE — Progress Notes (Signed)
Endocrinology Follow Up Visit      09/16/2020, 1:23 PM   Subjective:    Patient ID: Samantha Pierce, female    DOB: 07-12-1970.  Samantha Pierce is being seen in follow up after being seen in consultation for management of currently uncontrolled symptomatic diabetes requested by  Rosita Fire, MD.   Past Medical History:  Diagnosis Date  . Anemia   . Anxiety   . Arthritis   . Asystole ([place])    a. 05/2015: pt developed bradycardia->asystole during [place] nuclear stress test, s/p brief code. Cath with only 30% prox LCx. Her asystole during Lexiscan infusion was felt to represent an excessive pharmacologic response to the agent and likely superimposed vasovagal response.  . Bipolar disorder ([place])   . CKD (chronic kidney disease) stage 4, GFR 15-29 ml/min (HCC)   . COPD (chronic obstructive pulmonary disease) ([place])   . Depression   . Essential hypertension   . GERD (gastroesophageal reflux disease)   . History of blood transfusion   . History of seizure 2017  . History of stroke April 2016   Acute lacunar infarct of the left thalamus  . Leg cramps   . Mild CAD    a. LHC 05/2015: 30% prox Cx, otherwise widely patent.  . Morbid obesity ([place])   . Neuropathy   . PTSD (post-traumatic stress disorder)   . Type 2 diabetes mellitus ([place])     Past Surgical History:  Procedure Laterality Date  . AV FISTULA PLACEMENT Right 01/08/2017   Procedure: RIGHT ARM BRACHIOCEPHALIC ARTERIOVENOUS (AV) FISTULA CREATION;  Surgeon: Waynetta Sandy, MD;  Location: [facility];  Service: Vascular;  Laterality: Right;  . AV FISTULA PLACEMENT Left 11/20/2018   Procedure: Creation of LEFT ARM ARTERIOVENOUS (AV) FISTULA;  Surgeon: Waynetta Sandy, MD;  Location: [facility];  Service: Vascular;  Laterality: Left;  . BASCILIC VEIN TRANSPOSITION Left 02/11/2019   Procedure: BASILIC VEIN TRANSPOSITION SECOND STAGE;  Surgeon: Waynetta Sandy, MD;  Location: [facility];  Service: Vascular;  Laterality: Left;  . CARDIAC CATHETERIZATION N/A 06/01/2015   Procedure: Left Heart Cath and Coronary Angiography;  Surgeon: Belva Crome, MD; CFX 30%, no other dz, EF nl by echo  . CESAREAN SECTION     x2  . DILATION AND CURETTAGE OF UTERUS    . EYE SURGERY     Right eye pars plano vitrectomy   . HEMATOMA EVACUATION Left 03/03/2019   Procedure: EVACUATION HEMATOMA;  Surgeon: Elam Dutch, MD;  Location: [facility];  Service: Vascular;  Laterality: Left;  . LASIK    . PARS PLANA VITRECTOMY  04/20/2011   Procedure: PARS PLANA VITRECTOMY WITH 25 GAUGE;  Surgeon: Hayden Pedro, MD;  Location: [facility];  Service: Ophthalmology;  Laterality: Left;  membrane peel, gas fluid exchange, endolaser, repair of complex retinal detachment  . PERIPHERAL VASCULAR BALLOON ANGIOPLASTY Left 09/02/2020   Procedure: PERIPHERAL VASCULAR BALLOON ANGIOPLASTY;  Surgeon: Marty Heck, MD;  Location: [facility];  Service: Cardiovascular;  Laterality: Left;  arm fistula  . TUBAL LIGATION      Social History   Socioeconomic History  . Marital status: Single  Spouse name: Not on file  . Number of children: Not on file  . Years of education: Not on file  . Highest education level: Not on file  Occupational History  . Not on file  Tobacco Use  . Smoking status: Current Some Day Smoker    Packs/day: 0.03    Years: 30.00    Pack years: 0.90    Types: Cigarettes    Start date: 06/05/1981    Last attempt to quit: 12/04/2018    Years since quitting: 1.7  . Smokeless tobacco: Never Used  Vaping Use  . Vaping Use: Never used  Substance and Sexual Activity  . Alcohol use: Yes    Alcohol/week: 0.0 standard drinks    Comment: occ wine  . Drug use: No  . Sexual activity: Yes    Birth control/protection: Surgical  Other Topics Concern  . Not on file  Social History Narrative  . Not on file   Social Determinants of Health    Financial Resource Strain: Not on file  Food Insecurity: Not on file  Transportation Needs: Not on file  Physical Activity: Not on file  Stress: Not on file  Social Connections: Not on file    Family History  Problem Relation Age of Onset  . Diabetes Mother   . Kidney failure Mother   . Diabetes Father   . Amblyopia Neg Hx   . Blindness Neg Hx   . Cataracts Neg Hx   . Glaucoma Neg Hx   . Macular degeneration Neg Hx   . Retinal detachment Neg Hx   . Strabismus Neg Hx   . Retinitis pigmentosa Neg Hx     Outpatient Encounter Medications as of 09/16/2020  Medication Sig  . albuterol (PROVENTIL HFA;VENTOLIN HFA) 108 (90 BASE) MCG/ACT inhaler Inhale 1-2 puffs into the lungs every 6 (six) hours as needed for wheezing or shortness of breath. (Patient taking differently: Inhale 1-2 puffs into the lungs every 6 (six) hours as needed for shortness of breath.)  . amLODipine (NORVASC) 10 MG tablet Take 1 tablet (10 mg total) by mouth daily.  Marland Kitchen atorvastatin (LIPITOR) 80 MG tablet Take 1 tablet (80 mg total) by mouth daily at 6 PM. (Patient taking differently: Take 80 mg by mouth every morning.)  . cholecalciferol (VITAMIN D3) 25 MCG (1000 UNIT) tablet Take 3,000 Units by mouth daily.  . cloNIDine (CATAPRES) 0.2 MG tablet Take 0.2 mg by mouth daily.  Marland Kitchen FLUoxetine (PROZAC) 10 MG tablet Take 10 mg by mouth daily.  Marland Kitchen FLUoxetine (PROZAC) 20 MG capsule Take 20 mg by mouth daily.  . furosemide (LASIX) 80 MG tablet Take 80 mg by mouth 2 (two) times daily.  Marland Kitchen gabapentin (NEURONTIN) 300 MG capsule Take 300 mg by mouth at bedtime.   . hydrALAZINE (APRESOLINE) 50 MG tablet Take 50 mg by mouth 2 (two) times daily.  . insulin aspart (NOVOLOG) 100 UNIT/ML FlexPen Inject 10-16 Units into the skin 3 (three) times daily before meals. Sliding scale  . Insulin Detemir (LEVEMIR) 100 UNIT/ML Pen Inject 50 Units into the skin at bedtime. (Patient taking differently: Inject 80 Units into the skin at bedtime.)  .  metoprolol succinate (TOPROL-XL) 50 MG 24 hr tablet Take 50 mg by mouth daily.  Marland Kitchen OLANZapine (ZYPREXA) 2.5 MG tablet Take 2.5 mg by mouth daily.  Marland Kitchen omeprazole (PRILOSEC) 20 MG capsule Take 20 mg by mouth daily before breakfast.   . traZODone (DESYREL) 100 MG tablet Take 100 mg by mouth at bedtime.  No facility-administered encounter medications on file as of 09/16/2020.    ALLERGIES: Allergies  Allergen Reactions  . Contrast Media [Iodinated Diagnostic Agents] Anaphylaxis    "code blue--I died"  . Penicillins Rash and Other (See Comments)    Has patient had a PCN reaction causing immediate rash, facial/tongue/throat swelling, SOB or lightheadedness with hypotension: Yes Has patient had a PCN reaction causing severe rash involving mucus membranes or skin necrosis: No Has patient had a PCN reaction that required hospitalization No Has patient had a PCN reaction occurring within the last 10 years: No If all of the above answers are "NO", then may proceed with Cephalosporin use.     VACCINATION STATUS: Immunization History  Administered Date(s) Administered  . Influenza,trivalent, recombinat, inj, PF 07/05/2016  . Moderna Sars-Covid-2 Vaccination 10/27/2019, 11/24/2019  . Pneumococcal Polysaccharide-23 09/05/2014    Diabetes She presents for her follow-up diabetic visit. She has type 2 diabetes mellitus. Onset time: She was diagnosed at approximate age of 72. Her disease course has been improving. There are no hypoglycemic associated symptoms. Associated symptoms include blurred vision, fatigue and foot paresthesias. Pertinent negatives for diabetes include no polydipsia, no polyphagia and no polyuria. There are no hypoglycemic complications. Symptoms are improving. Diabetic complications include a CVA, heart disease, nephropathy, peripheral neuropathy and retinopathy. (Legally blind in right eye, says she was hospitalized for DKA once; now on HD) Risk factors for coronary artery disease  include diabetes mellitus, dyslipidemia, hypertension, obesity, sedentary lifestyle and tobacco exposure. Current diabetic treatment includes intensive insulin program. She is compliant with treatment some of the time (will skip on dialysis days). Her weight is fluctuating minimally. She is following a generally healthy diet. When asked about meal planning, she reported none. She has not had a previous visit with a dietitian. She rarely participates in exercise. Her home blood glucose trend is decreasing steadily. Her breakfast blood glucose range is generally 110-130 mg/dl. Her lunch blood glucose range is generally 180-200 mg/dl. Her overall blood glucose range is >200 mg/dl. (She presents today with her meter and logs showing inconsistent monitoring pattern but improved glycemic profile.  Her previsit A1c was 9.1% on 3/28, improving from previous visit of 12.4%.  She misses some opportunities for injecting insulin due to her HD schedule which she started in March.  There are no major episodes of hypoglycemia noted.) An ACE inhibitor/angiotensin II receptor blocker is not being taken. She does not see a podiatrist.Eye exam is current.  Hyperlipidemia This is a chronic problem. The current episode started more than 1 year ago. The problem is uncontrolled. Recent lipid tests were reviewed and are high. Exacerbating diseases include chronic renal disease, diabetes and obesity. Factors aggravating her hyperlipidemia include beta blockers and fatty foods. Current antihyperlipidemic treatment includes statins. The current treatment provides moderate improvement of lipids. Compliance problems include adherence to diet and adherence to exercise.  Risk factors for coronary artery disease include diabetes mellitus, dyslipidemia, hypertension, obesity and a sedentary lifestyle.  Hypertension This is a chronic problem. The current episode started more than 1 year ago. The problem has been gradually improving since onset.  The problem is controlled. Associated symptoms include blurred vision. There are no associated agents to hypertension. Risk factors for coronary artery disease include diabetes mellitus, dyslipidemia, obesity, sedentary lifestyle and smoking/tobacco exposure. Past treatments include calcium channel blockers, diuretics, central alpha agonists and beta blockers. The current treatment provides mild improvement. There are no compliance problems.  Hypertensive end-organ damage includes kidney disease, CAD/MI, CVA, heart  failure and retinopathy. Identifiable causes of hypertension include chronic renal disease.    Review of systems  Constitutional: + Minimally fluctuating body weight,  current There is no height or weight on file to calculate BMI. , + fatigue, no subjective hyperthermia, no subjective hypothermia Eyes: + blurry vision (diabetic retinopathy and legally blind in Right eye), no xerophthalmia ENT: no sore throat, no nodules palpated in throat, no dysphagia/odynophagia, no hoarseness Cardiovascular: no chest pain, no shortness of breath, no palpitations, no leg swelling Respiratory: no cough, no shortness of breath Gastrointestinal: no nausea/vomiting/diarrhea Musculoskeletal: no muscle/joint aches Skin: no rashes, no hyperemia Neurological: no tremors, no dizziness, + numbness/tingling to BLE R>L Psychiatric: no depression, no anxiety   Objective:     BP (!) 149/67 (BP Location: Right Arm, Patient Position: Sitting)   Pulse 76   Ht '5\' 4"'$  (1.626 m)   Wt 262 lb (118.8 kg)   BMI 44.97 kg/m   Wt Readings from Last 3 Encounters:  09/16/20 262 lb (118.8 kg)  09/02/20 263 lb 14.3 oz (119.7 kg)  07/07/20 267 lb 12.8 oz (121.5 kg)    BP Readings from Last 3 Encounters:  09/16/20 (!) 149/67  09/02/20 (!) 184/80  07/07/20 (!) 168/81     Physical Exam- Limited  Constitutional:  Body mass index is 44.97 kg/m. , not in acute distress, normal state of mind Eyes:  EOMI, no  exophthalmos Neck: Supple Cardiovascular: RRR, no murmers, rubs, or gallops, no edema Respiratory: Adequate breathing efforts, no crackles, rales, rhonchi, or wheezing Musculoskeletal: no gross deformities, strength intact in all four extremities, no gross restriction of joint movements Skin:  no rashes, no hyperemia Neurological: no tremor with outstretched hands      CMP ( most recent) CMP     Component Value Date/Time   NA 139 09/02/2020 0954   K 4.4 09/02/2020 0954   CL 101 09/02/2020 0954   CO2 21 (L) 03/04/2019 0616   GLUCOSE 63 (L) 09/02/2020 0954   BUN 44 (H) 09/02/2020 0954   CREATININE 5.80 (H) 09/02/2020 0954   CALCIUM 7.8 (L) 03/04/2019 0616   PROT 6.8 02/22/2019 1222   ALBUMIN 2.9 (L) 02/22/2019 1222   AST 16 02/22/2019 1222   ALT 13 02/22/2019 1222   ALKPHOS 60 02/22/2019 1222   BILITOT 0.6 02/22/2019 1222   GFRNONAA 10 (L) 03/04/2019 0616   GFRAA 12 (L) 03/04/2019 0616     Diabetic Labs (most recent): Lab Results  Component Value Date   HGBA1C 12.4 05/07/2020   HGBA1C 9.8 (H) 01/19/2019   HGBA1C 10.4 (H) 05/23/2015     Lipid Panel ( most recent) Lipid Panel     Component Value Date/Time   CHOL 245 (H) 05/23/2015 0545   TRIG 278 (H) 05/23/2015 0545   HDL 45 05/23/2015 0545   CHOLHDL 5.4 05/23/2015 0545   VLDL 56 (H) 05/23/2015 0545   LDLCALC 144 (H) 05/23/2015 0545      Lab Results  Component Value Date   TSH 1.524 09/04/2014           Assessment & Plan:   1) Uncontrolled diabetes mellitus with stage 4 chronic kidney disease ([place])  She presents today with her meter and logs showing inconsistent monitoring pattern but improved glycemic profile.  Her previsit A1c was 9.1% on 3/28, improving from previous visit of 12.4%.  She misses some opportunities for injecting insulin due to her HD schedule which she started in March.  There are no major episodes of hypoglycemia  noted.  - PAISELY STOFFERS has currently uncontrolled symptomatic  type 2 DM since 50 years of age.  -Recent labs reviewed.  - I had a long discussion with her about the progressive nature of diabetes and the pathology behind its complications. -her diabetes is complicated by CVA, retinopathy, neuropathy, CKD, and DKA x 1 episode and she remains at a high risk for more acute and chronic complications which include worsening CAD, CVA, CKD, retinopathy, and neuropathy. These are all discussed in detail with her.  - Nutritional counseling repeated at each appointment due to patients tendency to fall back in to old habits.  - The patient admits there is a room for improvement in their diet and drink choices. -  Suggestion is made for the patient to avoid simple carbohydrates from their diet including Cakes, Sweet Desserts / Pastries, Ice Cream, Soda (diet and regular), Sweet Tea, Candies, Chips, Cookies, Sweet Pastries,  Store Bought Juices, Alcohol in Excess of  1-2 drinks a day, Artificial Sweeteners, Coffee Creamer, and "Sugar-free" Products. This will help patient to have stable blood glucose profile and potentially avoid unintended weight gain.   - I encouraged the patient to switch to  unprocessed or minimally processed complex starch and increased protein intake (animal or plant source), fruits, and vegetables.   - Patient is advised to stick to a routine mealtimes to eat 3 meals  a day and avoid unnecessary snacks ( to snack only to correct hypoglycemia).  - she will be scheduled with Jearld Fenton, RDN, CDE for diabetes education.  - I have approached her with the following individualized plan to manage  her diabetes and patient agrees:   -Given her improved glycemic profile overall and inconsistent monitoring pattern, no changes will be made to her medications today. She is advised to continue Levemir 80 units SQ daily at bedtime and adjust her Novolog to 10-16 units TID with meals if glucose is above 90 and she is eating.  Specific instructions on how to  titrate insulin dose based on glucose readings given to patient in writing.  -she is encouraged to continue monitoring blood glucose 4 times daily, before meals and before bed, and to call the clinic if she has readings less than 70 or greater than 300 for 3 tests in a row.    -She could benefit greatly from CGM device, especially given her significant visual impairment.  Sample Dexcom given to patient.  Will send in Rx to [facility].  - she is warned not to take insulin without proper monitoring per orders. - Adjustment parameters are given to her for hypo and hyperglycemia in writing.  - she is not a candidate for Metformin, SGLT2 due to concurrent renal insufficiency.  - she will be considered for incretin therapy as appropriate next visit.  - Specific targets for  A1c;  LDL, HDL,  and Triglycerides were discussed with the patient.  2) Blood Pressure /Hypertension:  her blood pressure is not controlled to target.   she is advised to continue her current medications including Norvasc 10 mg po daily, Clonidine 0.2 mg po daily, Lasix 80 mg po twice daily, and Metoprolol 50 mg  mg p.o. daily with breakfast.  Will defer any med changes to nephrology.  3) Lipids/Hyperlipidemia:    There is no recent lipid panel to review.  She is advised to continue Lipitor 80 mg po daily before bed.  Side effects and precautions discussed with her.  Will recheck lipid panel prior to next visit.  4)  Weight/Diet:  her Body mass index is 44.97 kg/m.  -  clearly complicating her diabetes care.   she is a candidate for weight loss. I discussed with her the fact that loss of 5 - 10% of her  current body weight will have the most impact on her diabetes management.  Exercise, and detailed carbohydrates information provided  -  detailed on discharge instructions.  5) Chronic Care/Health Maintenance: -she is not on ACEI/ARB and is on Statin medications and is encouraged to initiate and continue to follow up with  Ophthalmology, Dentist, Podiatrist at least yearly or according to recommendations, and advised to QUIT smoking altogether. I have recommended yearly flu vaccine and pneumonia vaccine at least every 5 years; moderate intensity exercise for up to 150 minutes weekly; and sleep for at least 7 hours a day.  - she is advised to maintain close follow up with Rosita Fire, MD for primary care needs, as well as her other providers for optimal and coordinated care.    I spent 40 minutes in the care of the patient today including review of labs from [place], Lipids, Thyroid Function, Hematology (current and previous including abstractions from other facilities); face-to-face time discussing  her blood glucose readings/logs, discussing hypoglycemia and hyperglycemia episodes and symptoms, medications doses, her options of short and long term treatment based on the latest standards of care / guidelines;  discussion about incorporating lifestyle medicine;  and documenting the encounter.    Please refer to Patient Instructions for Blood Glucose Monitoring and Insulin/Medications Dosing Guide"  in media tab for additional information. Please  also refer to " Patient Self Inventory" in the Media  tab for reviewed elements of pertinent patient history.  Samantha Pierce participated in the discussions, expressed understanding, and voiced agreement with the above plans.  All questions were answered to her satisfaction. she is encouraged to contact clinic should she have any questions or concerns prior to her return visit.   Follow up plan: - Return in about 3 months (around 12/16/2020) for Diabetes follow up with A1c in office, Previsit labs, Bring glucometer and logs.  [name], [facility] [address] [place], [place] [address] Phone: [phone] Fax: [phone]  09/16/2020, 1:23 PM

## 2020-09-17 DIAGNOSIS — Z992 Dependence on renal dialysis: Secondary | ICD-10-CM | POA: Diagnosis not present

## 2020-09-17 DIAGNOSIS — Z23 Encounter for immunization: Secondary | ICD-10-CM | POA: Diagnosis not present

## 2020-09-17 DIAGNOSIS — N186 End stage renal disease: Secondary | ICD-10-CM | POA: Diagnosis not present

## 2020-09-20 DIAGNOSIS — N186 End stage renal disease: Secondary | ICD-10-CM | POA: Diagnosis not present

## 2020-09-20 DIAGNOSIS — Z992 Dependence on renal dialysis: Secondary | ICD-10-CM | POA: Diagnosis not present

## 2020-09-20 DIAGNOSIS — Z23 Encounter for immunization: Secondary | ICD-10-CM | POA: Diagnosis not present

## 2020-09-22 DIAGNOSIS — Z23 Encounter for immunization: Secondary | ICD-10-CM | POA: Diagnosis not present

## 2020-09-22 DIAGNOSIS — Z992 Dependence on renal dialysis: Secondary | ICD-10-CM | POA: Diagnosis not present

## 2020-09-22 DIAGNOSIS — N186 End stage renal disease: Secondary | ICD-10-CM | POA: Diagnosis not present

## 2020-09-24 DIAGNOSIS — Z23 Encounter for immunization: Secondary | ICD-10-CM | POA: Diagnosis not present

## 2020-09-24 DIAGNOSIS — N186 End stage renal disease: Secondary | ICD-10-CM | POA: Diagnosis not present

## 2020-09-24 DIAGNOSIS — Z992 Dependence on renal dialysis: Secondary | ICD-10-CM | POA: Diagnosis not present

## 2020-09-24 DIAGNOSIS — D509 Iron deficiency anemia, unspecified: Secondary | ICD-10-CM | POA: Diagnosis not present

## 2020-09-27 DIAGNOSIS — Z992 Dependence on renal dialysis: Secondary | ICD-10-CM | POA: Diagnosis not present

## 2020-09-27 DIAGNOSIS — Z23 Encounter for immunization: Secondary | ICD-10-CM | POA: Diagnosis not present

## 2020-09-27 DIAGNOSIS — N186 End stage renal disease: Secondary | ICD-10-CM | POA: Diagnosis not present

## 2020-09-29 ENCOUNTER — Telehealth: Payer: Self-pay

## 2020-09-29 DIAGNOSIS — N186 End stage renal disease: Secondary | ICD-10-CM | POA: Diagnosis not present

## 2020-09-29 DIAGNOSIS — Z23 Encounter for immunization: Secondary | ICD-10-CM | POA: Diagnosis not present

## 2020-09-29 DIAGNOSIS — Z992 Dependence on renal dialysis: Secondary | ICD-10-CM | POA: Diagnosis not present

## 2020-09-29 DIAGNOSIS — N2581 Secondary hyperparathyroidism of renal origin: Secondary | ICD-10-CM | POA: Diagnosis not present

## 2020-09-29 NOTE — Telephone Encounter (Signed)
Patient is s/p 2nd stage L BVT in 2020, and fistulogram/angioplasty 09/02/20. She very recently started dialysis and HD says it has been difficult to cannulate, and once they cannulate it, it has been running poorly. Her flow rates improved slightly after the fistulogram - but now they are having a lot of trouble again. Suspect patient may need graft. Scheduled patient for dialysis access duplex and follow up with MD.

## 2020-09-30 ENCOUNTER — Ambulatory Visit (HOSPITAL_COMMUNITY)
Admission: RE | Admit: 2020-09-30 | Discharge: 2020-09-30 | Disposition: A | Discharge status: Home / Self Care | Payer: Medicare HMO | Source: Ambulatory Visit | Attending: Vascular Surgery | Admitting: Vascular Surgery

## 2020-09-30 ENCOUNTER — Ambulatory Visit (INDEPENDENT_AMBULATORY_CARE_PROVIDER_SITE_OTHER): Payer: Medicare HMO | Admitting: Vascular Surgery

## 2020-09-30 ENCOUNTER — Other Ambulatory Visit: Payer: Self-pay

## 2020-09-30 ENCOUNTER — Encounter: Payer: Self-pay | Admitting: Vascular Surgery

## 2020-09-30 VITALS — BP 160/91 | HR 70 | Temp 97.8°F | Resp 20 | Ht 64.0 in | Wt 264.9 lb

## 2020-09-30 DIAGNOSIS — N186 End stage renal disease: Secondary | ICD-10-CM

## 2020-09-30 DIAGNOSIS — Z992 Dependence on renal dialysis: Secondary | ICD-10-CM

## 2020-09-30 DIAGNOSIS — N184 Chronic kidney disease, stage 4 (severe): Secondary | ICD-10-CM

## 2020-09-30 NOTE — Progress Notes (Signed)
Patient is a 50 year old female sent for evaluation of poorly performing AV fistula.  She had a second stage basilic vein transposition performed September 2020.  She started dialysis August 17, 2020.  The fistula has not worked well since then.  She had angioplasty of the outflow vein on March 28 by Dr. Carlis Abbott.  Her hemodialysis today is Monday Wednesday Friday.   Physical exam:  Vitals:   09/30/20 1533  BP: (!) 160/91  Pulse: 70  Resp: 20  Temp: 97.8 F (36.6 C)  SpO2: 98%  Weight: 264 lb 14.4 oz (120.2 kg)  Height: '5\' 4"'$  (1.626 m)    Fistula left upper arm fairly difficult to palpate but there is a faint thrill at the proximal anastomosis.   Data: Patient had a duplex ultrasound today of her AV fistula.  This showed the fistula was patent.  However there is thrombus and narrowing adjacent to the arterial anastomosis at the antecubital fossa and proximal portion of the fistula.  Reviewed the patient's recent sonogram from about a month ago with Dr. Carlis Abbott angioplasty of the outflow portion of the vein with residual 30% stenosis.  Assessment: Poorly functioning left arm AV fistula.  Most likely has inflow and outflow narrowing.  Plan: Revision left arm AV fistula with plan to do patch angioplasty of distal outflow stenosis at the level of the axilla based on her previous shuntogram.  We will also do an ultrasound of her AV fistula the day of the procedure to see whether or not the proximal anastomosis may also need to be revised.  This will be scheduled for Tuesday, Oct 05, 2020.  Patient would like to be admitted post procedure as she has had problems with outpatient procedures before when she got home.  We will try to get her dialyzed on Wednesday morning to make sure her fistula is working adequately prior to discharging her from the hospital.  If she has continued problems with her access she might would require a catheter as well.  Ruta Hinds, MD [facility]  [place] Office: [phone]

## 2020-09-30 NOTE — H&P (View-Only) (Signed)
Patient is a 50 year old female sent for evaluation of poorly performing AV fistula.  She had a second stage basilic vein transposition performed September 2020.  She started dialysis August 17, 2020.  The fistula has not worked well since then.  She had angioplasty of the outflow vein on March 28 by Dr. Carlis Abbott.  Her hemodialysis today is Monday Wednesday Friday.   Physical exam:  Vitals:   09/30/20 1533  BP: (!) 160/91  Pulse: 70  Resp: 20  Temp: 97.8 F (36.6 C)  SpO2: 98%  Weight: 264 lb 14.4 oz (120.2 kg)  Height: '5\' 4"'$  (1.626 m)    Fistula left upper arm fairly difficult to palpate but there is a faint thrill at the proximal anastomosis.   Data: Patient had a duplex ultrasound today of her AV fistula.  This showed the fistula was patent.  However there is thrombus and narrowing adjacent to the arterial anastomosis at the antecubital fossa and proximal portion of the fistula.  Reviewed the patient's recent sonogram from about a month ago with Dr. Carlis Abbott angioplasty of the outflow portion of the vein with residual 30% stenosis.  Assessment: Poorly functioning left arm AV fistula.  Most likely has inflow and outflow narrowing.  Plan: Revision left arm AV fistula with plan to do patch angioplasty of distal outflow stenosis at the level of the axilla based on her previous shuntogram.  We will also do an ultrasound of her AV fistula the day of the procedure to see whether or not the proximal anastomosis may also need to be revised.  This will be scheduled for Tuesday, Oct 05, 2020.  Patient would like to be admitted post procedure as she has had problems with outpatient procedures before when she got home.  We will try to get her dialyzed on Wednesday morning to make sure her fistula is working adequately prior to discharging her from the hospital.  If she has continued problems with her access she might would require a catheter as well.  Ruta Hinds, MD [facility]  [place] Office: [phone]

## 2020-10-01 DIAGNOSIS — D509 Iron deficiency anemia, unspecified: Secondary | ICD-10-CM | POA: Diagnosis not present

## 2020-10-01 DIAGNOSIS — Z23 Encounter for immunization: Secondary | ICD-10-CM | POA: Diagnosis not present

## 2020-10-01 DIAGNOSIS — N2581 Secondary hyperparathyroidism of renal origin: Secondary | ICD-10-CM | POA: Diagnosis not present

## 2020-10-01 DIAGNOSIS — Z992 Dependence on renal dialysis: Secondary | ICD-10-CM | POA: Diagnosis not present

## 2020-10-01 DIAGNOSIS — N186 End stage renal disease: Secondary | ICD-10-CM | POA: Diagnosis not present

## 2020-10-02 DIAGNOSIS — Z992 Dependence on renal dialysis: Secondary | ICD-10-CM | POA: Diagnosis not present

## 2020-10-02 DIAGNOSIS — N186 End stage renal disease: Secondary | ICD-10-CM | POA: Diagnosis not present

## 2020-10-04 ENCOUNTER — Other Ambulatory Visit: Payer: Self-pay

## 2020-10-04 ENCOUNTER — Ambulatory Visit: Payer: Medicare HMO | Admitting: Nurse Practitioner

## 2020-10-04 ENCOUNTER — Encounter (HOSPITAL_COMMUNITY): Payer: Self-pay | Admitting: Vascular Surgery

## 2020-10-04 DIAGNOSIS — N186 End stage renal disease: Secondary | ICD-10-CM | POA: Diagnosis not present

## 2020-10-04 DIAGNOSIS — Z992 Dependence on renal dialysis: Secondary | ICD-10-CM | POA: Diagnosis not present

## 2020-10-04 DIAGNOSIS — Z23 Encounter for immunization: Secondary | ICD-10-CM | POA: Diagnosis not present

## 2020-10-04 MED ORDER — VANCOMYCIN HCL 1500 MG/300ML IV SOLN
1500.0000 mg | INTRAVENOUS | Status: AC
  Administered 2020-10-05: 1500 mg via INTRAVENOUS
  Filled 2020-10-04 (×2): qty 300

## 2020-10-04 NOTE — Progress Notes (Signed)
PCP - Dr Rosita Fire Cardiologist - Dr Rozann Lesches Neurology - Dr Ulice Bold  Chest x-ray - n/a EKG - 09/02/20 Stress Test - 06/01/15 ECHO - 01/20/19 Cardiac Cath - 06/01/15  Sleep Study -  Yes, but patient unsure of results CPAP - none  Fasting Blood Sugar - 110-180s Checks Blood Sugar 2 times a day  . THE NIGHT BEFORE SURGERY, take 40 Units Levemir Insulin.    . If your blood sugar is less than 70 mg/dL, you will need to treat for low blood sugar: o Treat a low blood sugar (less than 70 mg/dL) with  cup of clear juice (cranberry or apple), 4 glucose tablets, OR glucose gel. o Recheck blood sugar in 15 minutes after treatment (to make sure it is greater than 70 mg/dL). If your blood sugar is not greater than 70 mg/dL on recheck, call [phone] for further instructions.  Anesthesia review: Yes  STOP now taking any Aspirin (unless otherwise instructed by your surgeon), Aleve, Naproxen, Ibuprofen, Motrin, Advil, Goody's, BC's, all herbal medications, fish oil, and all vitamins.   Coronavirus Screening COVID TEST ON DOS Do you have any of the following symptoms:  Cough yes/no: No Fever (>100.31F)  yes/no: No Runny nose yes/no: No Sore throat yes/no: No Difficulty breathing/shortness of breath  yes/no: No  Have you traveled in the last 14 days and where? yes/no: No  Patient verbalized understanding of instructions that were given via phone.

## 2020-10-05 ENCOUNTER — Encounter (HOSPITAL_COMMUNITY): Payer: Self-pay | Admitting: Vascular Surgery

## 2020-10-05 ENCOUNTER — Inpatient Hospital Stay (HOSPITAL_COMMUNITY)
Admission: RE | Admit: 2020-10-05 | Discharge: 2020-10-06 | DRG: 252 | Disposition: A | Discharge status: Home / Self Care | Payer: Medicare HMO | Attending: Vascular Surgery | Admitting: Vascular Surgery

## 2020-10-05 ENCOUNTER — Inpatient Hospital Stay (HOSPITAL_COMMUNITY): Payer: Medicare HMO | Admitting: Physician Assistant

## 2020-10-05 ENCOUNTER — Encounter (HOSPITAL_COMMUNITY)
Admission: RE | Disposition: A | Discharge status: Home / Self Care | Payer: Self-pay | Source: Home / Self Care | Attending: Vascular Surgery

## 2020-10-05 ENCOUNTER — Other Ambulatory Visit: Payer: Self-pay

## 2020-10-05 DIAGNOSIS — Z794 Long term (current) use of insulin: Secondary | ICD-10-CM

## 2020-10-05 DIAGNOSIS — Y832 Surgical operation with anastomosis, bypass or graft as the cause of abnormal reaction of the patient, or of later complication, without mention of misadventure at the time of the procedure: Secondary | ICD-10-CM | POA: Diagnosis not present

## 2020-10-05 DIAGNOSIS — Z880 Allergy status to penicillin: Secondary | ICD-10-CM

## 2020-10-05 DIAGNOSIS — E1140 Type 2 diabetes mellitus with diabetic neuropathy, unspecified: Secondary | ICD-10-CM | POA: Diagnosis present

## 2020-10-05 DIAGNOSIS — D631 Anemia in chronic kidney disease: Secondary | ICD-10-CM | POA: Diagnosis present

## 2020-10-05 DIAGNOSIS — I120 Hypertensive chronic kidney disease with stage 5 chronic kidney disease or end stage renal disease: Secondary | ICD-10-CM | POA: Diagnosis present

## 2020-10-05 DIAGNOSIS — Z841 Family history of disorders of kidney and ureter: Secondary | ICD-10-CM

## 2020-10-05 DIAGNOSIS — I770 Arteriovenous fistula, acquired: Secondary | ICD-10-CM | POA: Diagnosis not present

## 2020-10-05 DIAGNOSIS — T82590A Other mechanical complication of surgically created arteriovenous fistula, initial encounter: Secondary | ICD-10-CM | POA: Diagnosis not present

## 2020-10-05 DIAGNOSIS — Z833 Family history of diabetes mellitus: Secondary | ICD-10-CM

## 2020-10-05 DIAGNOSIS — F17210 Nicotine dependence, cigarettes, uncomplicated: Secondary | ICD-10-CM | POA: Diagnosis present

## 2020-10-05 DIAGNOSIS — Z8674 Personal history of sudden cardiac arrest: Secondary | ICD-10-CM | POA: Diagnosis not present

## 2020-10-05 DIAGNOSIS — E1122 Type 2 diabetes mellitus with diabetic chronic kidney disease: Secondary | ICD-10-CM | POA: Diagnosis present

## 2020-10-05 DIAGNOSIS — J449 Chronic obstructive pulmonary disease, unspecified: Secondary | ICD-10-CM | POA: Diagnosis present

## 2020-10-05 DIAGNOSIS — N186 End stage renal disease: Secondary | ICD-10-CM | POA: Diagnosis not present

## 2020-10-05 DIAGNOSIS — N2581 Secondary hyperparathyroidism of renal origin: Secondary | ICD-10-CM | POA: Diagnosis present

## 2020-10-05 DIAGNOSIS — Z79899 Other long term (current) drug therapy: Secondary | ICD-10-CM

## 2020-10-05 DIAGNOSIS — T82898A Other specified complication of vascular prosthetic devices, implants and grafts, initial encounter: Principal | ICD-10-CM | POA: Diagnosis present

## 2020-10-05 DIAGNOSIS — Z91041 Radiographic dye allergy status: Secondary | ICD-10-CM | POA: Diagnosis not present

## 2020-10-05 DIAGNOSIS — E1129 Type 2 diabetes mellitus with other diabetic kidney complication: Secondary | ICD-10-CM | POA: Diagnosis not present

## 2020-10-05 DIAGNOSIS — Z9115 Patient's noncompliance with renal dialysis: Secondary | ICD-10-CM

## 2020-10-05 DIAGNOSIS — I5031 Acute diastolic (congestive) heart failure: Secondary | ICD-10-CM | POA: Diagnosis not present

## 2020-10-05 DIAGNOSIS — I2510 Atherosclerotic heart disease of native coronary artery without angina pectoris: Secondary | ICD-10-CM | POA: Diagnosis present

## 2020-10-05 DIAGNOSIS — E8889 Other specified metabolic disorders: Secondary | ICD-10-CM | POA: Diagnosis present

## 2020-10-05 DIAGNOSIS — Z8673 Personal history of transient ischemic attack (TIA), and cerebral infarction without residual deficits: Secondary | ICD-10-CM | POA: Diagnosis not present

## 2020-10-05 DIAGNOSIS — Z6841 Body Mass Index (BMI) 40.0 and over, adult: Secondary | ICD-10-CM | POA: Diagnosis not present

## 2020-10-05 DIAGNOSIS — N250 Renal osteodystrophy: Secondary | ICD-10-CM | POA: Diagnosis not present

## 2020-10-05 DIAGNOSIS — I132 Hypertensive heart and chronic kidney disease with heart failure and with stage 5 chronic kidney disease, or end stage renal disease: Secondary | ICD-10-CM | POA: Diagnosis not present

## 2020-10-05 DIAGNOSIS — Z992 Dependence on renal dialysis: Secondary | ICD-10-CM

## 2020-10-05 DIAGNOSIS — Z20822 Contact with and (suspected) exposure to covid-19: Secondary | ICD-10-CM | POA: Diagnosis present

## 2020-10-05 HISTORY — PX: PATCH ANGIOPLASTY: SHX6230

## 2020-10-05 HISTORY — DX: Myoneural disorder, unspecified: G70.9

## 2020-10-05 HISTORY — PX: REVISON OF ARTERIOVENOUS FISTULA: SHX6074

## 2020-10-05 LAB — GLUCOSE, CAPILLARY
Glucose-Capillary: 242 mg/dL — ABNORMAL HIGH (ref 70–99)
Glucose-Capillary: 109 mg/dL — ABNORMAL HIGH (ref 70–99)
Glucose-Capillary: 85 mg/dL (ref 70–99)
Glucose-Capillary: 118 mg/dL — ABNORMAL HIGH (ref 70–99)
Glucose-Capillary: 125 mg/dL — ABNORMAL HIGH (ref 70–99)
Glucose-Capillary: 108 mg/dL — ABNORMAL HIGH (ref 70–99)
Glucose-Capillary: 298 mg/dL — ABNORMAL HIGH (ref 70–99)
Glucose-Capillary: 414 mg/dL — ABNORMAL HIGH (ref 70–99)
Glucose-Capillary: 424 mg/dL — ABNORMAL HIGH (ref 70–99)

## 2020-10-05 LAB — CBC
HCT: 36.2 % (ref 36.0–46.0)
Hemoglobin: 11.2 g/dL — ABNORMAL LOW (ref 12.0–15.0)
MCH: 25.9 pg — ABNORMAL LOW (ref 26.0–34.0)
MCHC: 30.9 g/dL (ref 30.0–36.0)
MCV: 83.8 fL (ref 80.0–100.0)
Platelets: 222 10*3/uL (ref 150–400)
RBC: 4.32 MIL/uL (ref 3.87–5.11)
RDW: 13.4 % (ref 11.5–15.5)
WBC: 10.6 10*3/uL — ABNORMAL HIGH (ref 4.0–10.5)
nRBC: 0 % (ref 0.0–0.2)

## 2020-10-05 LAB — POCT I-STAT, CHEM 8
BUN: 38 mg/dL — ABNORMAL HIGH (ref 6–20)
Calcium, Ion: 1.1 mmol/L — ABNORMAL LOW (ref 1.15–1.40)
Chloride: 101 mmol/L (ref 98–111)
Creatinine, Ser: 5.6 mg/dL — ABNORMAL HIGH (ref 0.44–1.00)
Glucose, Bld: 138 mg/dL — ABNORMAL HIGH (ref 70–99)
HCT: 35 % — ABNORMAL LOW (ref 36.0–46.0)
Hemoglobin: 11.9 g/dL — ABNORMAL LOW (ref 12.0–15.0)
Potassium: 5 mmol/L (ref 3.5–5.1)
Sodium: 142 mmol/L (ref 135–145)
TCO2: 24 mmol/L (ref 22–32)

## 2020-10-05 LAB — CREATININE, SERUM
Creatinine, Ser: 5.45 mg/dL — ABNORMAL HIGH (ref 0.44–1.00)
GFR, Estimated: 9 mL/min — ABNORMAL LOW (ref >60)

## 2020-10-05 LAB — POCT PREGNANCY, URINE: Preg Test, Ur: NEGATIVE

## 2020-10-05 LAB — SARS CORONAVIRUS 2 BY RT PCR (HOSPITAL ORDER, PERFORMED IN ~~LOC~~ HOSPITAL LAB): SARS Coronavirus 2: NEGATIVE

## 2020-10-05 LAB — HEMOGLOBIN A1C
Hgb A1c MFr Bld: 8.7 % — ABNORMAL HIGH (ref 4.8–5.6)
Mean Plasma Glucose: 202.99 mg/dL

## 2020-10-05 SURGERY — REVISON OF ARTERIOVENOUS FISTULA
Anesthesia: General | Site: Arm Upper | Laterality: Left

## 2020-10-05 MED ORDER — DEXTROSE 50 % IV SOLN: 25.0000 mL | Freq: Once | INTRAVENOUS | Status: AC

## 2020-10-05 MED ORDER — VANCOMYCIN HCL 1000 MG/200ML IV SOLN
1000.0000 mg | Freq: Once | INTRAVENOUS | Status: AC
  Administered 2020-10-06: 1000 mg via INTRAVENOUS
  Filled 2020-10-05: qty 200

## 2020-10-05 MED ORDER — FLUOXETINE HCL 20 MG PO CAPS
30.0000 mg | ORAL_CAPSULE | Freq: Every day | ORAL | Status: DC
  Administered 2020-10-05 – 2020-10-06 (×2): 30 mg via ORAL
  Filled 2020-10-05 (×2): qty 1

## 2020-10-05 MED ORDER — ONDANSETRON HCL 4 MG/2ML IJ SOLN
4.0000 mg | Freq: Four times a day (QID) | INTRAMUSCULAR | Status: DC | PRN
  Administered 2020-10-06: 4 mg via INTRAVENOUS

## 2020-10-05 MED ORDER — METOPROLOL TARTRATE 5 MG/5ML IV SOLN: 2.0000 mg | INTRAVENOUS | Status: DC | PRN

## 2020-10-05 MED ORDER — HEPARIN SODIUM (PORCINE) 1000 UNIT/ML IJ SOLN
INTRAMUSCULAR | Status: AC
  Filled 2020-10-05: qty 1

## 2020-10-05 MED ORDER — TRAZODONE HCL 100 MG PO TABS
100.0000 mg | ORAL_TABLET | Freq: Every day | ORAL | Status: DC
  Administered 2020-10-05: 100 mg via ORAL
  Filled 2020-10-05: qty 1

## 2020-10-05 MED ORDER — LIDOCAINE HCL (PF) 1 % IJ SOLN
INTRAMUSCULAR | Status: AC
  Filled 2020-10-05: qty 30

## 2020-10-05 MED ORDER — ATORVASTATIN CALCIUM 80 MG PO TABS
80.0000 mg | ORAL_TABLET | Freq: Every morning | ORAL | Status: DC
  Administered 2020-10-05 – 2020-10-06 (×2): 80 mg via ORAL
  Filled 2020-10-05 (×2): qty 1

## 2020-10-05 MED ORDER — DEXAMETHASONE SODIUM PHOSPHATE 10 MG/ML IJ SOLN
INTRAMUSCULAR | Status: DC | PRN
  Administered 2020-10-05: 10 mg via INTRAVENOUS

## 2020-10-05 MED ORDER — SODIUM CHLORIDE 0.9 % IV SOLN
INTRAVENOUS | Status: DC
  Administered 2020-10-05: 10 mL/h via INTRAVENOUS

## 2020-10-05 MED ORDER — AMLODIPINE BESYLATE 10 MG PO TABS
10.0000 mg | ORAL_TABLET | Freq: Every day | ORAL | Status: DC
  Administered 2020-10-05: 10 mg via ORAL
  Filled 2020-10-05 (×2): qty 1

## 2020-10-05 MED ORDER — PHENYLEPHRINE HCL-NACL 10-0.9 MG/250ML-% IV SOLN
INTRAVENOUS | Status: DC | PRN
  Administered 2020-10-05: 20 ug/min via INTRAVENOUS

## 2020-10-05 MED ORDER — GABAPENTIN 300 MG PO CAPS
300.0000 mg | ORAL_CAPSULE | Freq: Every day | ORAL | Status: DC
  Administered 2020-10-05: 300 mg via ORAL
  Filled 2020-10-05: qty 1

## 2020-10-05 MED ORDER — LIDOCAINE-PRILOCAINE 2.5-2.5 % EX CREA
TOPICAL_CREAM | CUTANEOUS | Status: DC
  Filled 2020-10-05: qty 5

## 2020-10-05 MED ORDER — POLYETHYLENE GLYCOL 3350 17 G PO PACK: 17.0000 g | PACK | Freq: Every day | ORAL | Status: DC | PRN

## 2020-10-05 MED ORDER — OXYCODONE HCL 5 MG PO TABS: 5.0000 mg | ORAL_TABLET | Freq: Once | ORAL | Status: DC | PRN

## 2020-10-05 MED ORDER — ONDANSETRON HCL 4 MG/2ML IJ SOLN
INTRAMUSCULAR | Status: DC | PRN
  Administered 2020-10-05: 4 mg via INTRAVENOUS

## 2020-10-05 MED ORDER — FENTANYL CITRATE (PF) 250 MCG/5ML IJ SOLN
INTRAMUSCULAR | Status: AC
  Filled 2020-10-05: qty 5

## 2020-10-05 MED ORDER — INSULIN ASPART 100 UNIT/ML IJ SOLN
0.0000 [IU] | Freq: Three times a day (TID) | INTRAMUSCULAR | Status: DC
  Administered 2020-10-05 – 2020-10-06 (×2): 8 [IU] via SUBCUTANEOUS
  Administered 2020-10-06: 15 [IU] via SUBCUTANEOUS

## 2020-10-05 MED ORDER — ALBUTEROL SULFATE HFA 108 (90 BASE) MCG/ACT IN AERS: 1.0000 | INHALATION_SPRAY | Freq: Four times a day (QID) | RESPIRATORY_TRACT | Status: DC | PRN

## 2020-10-05 MED ORDER — HEPARIN SODIUM (PORCINE) 5000 UNIT/ML IJ SOLN
5000.0000 [IU] | Freq: Three times a day (TID) | INTRAMUSCULAR | Status: DC
  Administered 2020-10-06: 5000 [IU] via SUBCUTANEOUS
  Filled 2020-10-05: qty 1

## 2020-10-05 MED ORDER — ACETAMINOPHEN 160 MG/5ML PO SOLN: 325.0000 mg | ORAL | Status: DC | PRN

## 2020-10-05 MED ORDER — VITAMIN D3 25 MCG (1000 UNIT) PO TABS
2000.0000 [IU] | ORAL_TABLET | Freq: Every day | ORAL | Status: DC
  Administered 2020-10-06: 2000 [IU] via ORAL
  Filled 2020-10-05 (×2): qty 2

## 2020-10-05 MED ORDER — HEPARIN SODIUM (PORCINE) 1000 UNIT/ML IJ SOLN
INTRAMUSCULAR | Status: DC | PRN
  Administered 2020-10-05: 8000 [IU] via INTRAVENOUS

## 2020-10-05 MED ORDER — BISACODYL 10 MG RE SUPP: 10.0000 mg | Freq: Every day | RECTAL | Status: DC | PRN

## 2020-10-05 MED ORDER — MIDAZOLAM HCL 2 MG/2ML IJ SOLN
INTRAMUSCULAR | Status: DC | PRN
  Administered 2020-10-05: 2 mg via INTRAVENOUS

## 2020-10-05 MED ORDER — CHLORHEXIDINE GLUCONATE 0.12 % MT SOLN
15.0000 mL | Freq: Once | OROMUCOSAL | Status: AC
  Administered 2020-10-05: 15 mL via OROMUCOSAL

## 2020-10-05 MED ORDER — PANTOPRAZOLE SODIUM 40 MG PO TBEC
40.0000 mg | DELAYED_RELEASE_TABLET | Freq: Every day | ORAL | Status: DC
  Administered 2020-10-05 – 2020-10-06 (×2): 40 mg via ORAL
  Filled 2020-10-05 (×2): qty 1

## 2020-10-05 MED ORDER — ACETAMINOPHEN 325 MG PO TABS: 325.0000 mg | ORAL_TABLET | ORAL | Status: DC | PRN

## 2020-10-05 MED ORDER — HYDRALAZINE HCL 20 MG/ML IJ SOLN: 5.0000 mg | INTRAMUSCULAR | Status: DC | PRN

## 2020-10-05 MED ORDER — VANCOMYCIN HCL IN DEXTROSE 1-5 GM/200ML-% IV SOLN
INTRAVENOUS | Status: AC
  Filled 2020-10-05: qty 200

## 2020-10-05 MED ORDER — PHENOL 1.4 % MT LIQD: 1.0000 | OROMUCOSAL | Status: DC | PRN

## 2020-10-05 MED ORDER — DEXTROSE 50 % IV SOLN
INTRAVENOUS | Status: AC
  Administered 2020-10-05: 25 mL via INTRAVENOUS
  Filled 2020-10-05: qty 50

## 2020-10-05 MED ORDER — FENTANYL CITRATE (PF) 100 MCG/2ML IJ SOLN
INTRAMUSCULAR | Status: AC
  Filled 2020-10-05: qty 2

## 2020-10-05 MED ORDER — ACETAMINOPHEN 10 MG/ML IV SOLN
INTRAVENOUS | Status: AC
  Filled 2020-10-05: qty 100

## 2020-10-05 MED ORDER — EPHEDRINE SULFATE-NACL 50-0.9 MG/10ML-% IV SOSY
PREFILLED_SYRINGE | INTRAVENOUS | Status: DC | PRN
  Administered 2020-10-05 (×2): 10 mg via INTRAVENOUS
  Administered 2020-10-05: 5 mg via INTRAVENOUS

## 2020-10-05 MED ORDER — FUROSEMIDE 80 MG PO TABS
80.0000 mg | ORAL_TABLET | Freq: Two times a day (BID) | ORAL | Status: DC
  Administered 2020-10-05 – 2020-10-06 (×3): 80 mg via ORAL
  Filled 2020-10-05 (×3): qty 1

## 2020-10-05 MED ORDER — ACETAMINOPHEN 650 MG RE SUPP
325.0000 mg | RECTAL | Status: DC | PRN
Start: 2020-10-05 — End: 2020-10-06

## 2020-10-05 MED ORDER — 0.9 % SODIUM CHLORIDE (POUR BTL) OPTIME
TOPICAL | Status: DC | PRN
  Administered 2020-10-05: 1000 mL

## 2020-10-05 MED ORDER — MIDAZOLAM HCL 2 MG/2ML IJ SOLN
INTRAMUSCULAR | Status: AC
  Filled 2020-10-05: qty 2

## 2020-10-05 MED ORDER — INSULIN ASPART 100 UNIT/ML IJ SOLN: 5.0000 [IU] | Freq: Once | INTRAMUSCULAR | Status: AC

## 2020-10-05 MED ORDER — INSULIN DETEMIR 100 UNIT/ML ~~LOC~~ SOLN
80.0000 [IU] | Freq: Every day | SUBCUTANEOUS | Status: DC
  Administered 2020-10-05: 80 [IU] via SUBCUTANEOUS
  Filled 2020-10-05 (×2): qty 0.8

## 2020-10-05 MED ORDER — OXYCODONE HCL 5 MG/5ML PO SOLN: 5.0000 mg | Freq: Once | ORAL | Status: DC | PRN

## 2020-10-05 MED ORDER — SODIUM CHLORIDE 0.9 % IV SOLN
INTRAVENOUS | Status: AC
  Filled 2020-10-05: qty 1.2

## 2020-10-05 MED ORDER — LABETALOL HCL 5 MG/ML IV SOLN: 10.0000 mg | INTRAVENOUS | Status: DC | PRN

## 2020-10-05 MED ORDER — GUAIFENESIN-DM 100-10 MG/5ML PO SYRP: 15.0000 mL | ORAL_SOLUTION | ORAL | Status: DC | PRN

## 2020-10-05 MED ORDER — CHLORHEXIDINE GLUCONATE 4 % EX LIQD: 60.0000 mL | Freq: Once | CUTANEOUS | Status: DC

## 2020-10-05 MED ORDER — SODIUM CHLORIDE 0.9 % IV SOLN: INTRAVENOUS | Status: DC | PRN

## 2020-10-05 MED ORDER — ACETAMINOPHEN 10 MG/ML IV SOLN
1000.0000 mg | Freq: Once | INTRAVENOUS | Status: DC | PRN
  Administered 2020-10-05: 1000 mg via INTRAVENOUS

## 2020-10-05 MED ORDER — ORAL CARE MOUTH RINSE: 15.0000 mL | Freq: Once | OROMUCOSAL | Status: AC

## 2020-10-05 MED ORDER — INSULIN ASPART 100 UNIT/ML FLEXPEN: 10.0000 [IU] | PEN_INJECTOR | Freq: Three times a day (TID) | SUBCUTANEOUS | Status: DC

## 2020-10-05 MED ORDER — FENTANYL CITRATE (PF) 100 MCG/2ML IJ SOLN: 25.0000 ug | INTRAMUSCULAR | Status: DC | PRN

## 2020-10-05 MED ORDER — PROMETHAZINE HCL 25 MG/ML IJ SOLN: 6.2500 mg | INTRAMUSCULAR | Status: DC | PRN

## 2020-10-05 MED ORDER — LIDOCAINE 2% (20 MG/ML) 5 ML SYRINGE
INTRAMUSCULAR | Status: DC | PRN
  Administered 2020-10-05: 40 mg via INTRAVENOUS

## 2020-10-05 MED ORDER — INSULIN ASPART 100 UNIT/ML IJ SOLN
INTRAMUSCULAR | Status: AC
  Administered 2020-10-05: 5 [IU] via SUBCUTANEOUS
  Filled 2020-10-05: qty 1

## 2020-10-05 MED ORDER — CHLORHEXIDINE GLUCONATE 0.12 % MT SOLN
OROMUCOSAL | Status: AC
  Filled 2020-10-05: qty 15

## 2020-10-05 MED ORDER — VANCOMYCIN HCL 1000 MG/200ML IV SOLN: 1000.0000 mg | Freq: Two times a day (BID) | INTRAVENOUS | Status: DC

## 2020-10-05 MED ORDER — PROPOFOL 10 MG/ML IV BOLUS
INTRAVENOUS | Status: DC | PRN
  Administered 2020-10-05: 150 mg via INTRAVENOUS
  Administered 2020-10-05: 50 mg via INTRAVENOUS

## 2020-10-05 MED ORDER — OXYCODONE-ACETAMINOPHEN 5-325 MG PO TABS
1.0000 | ORAL_TABLET | Freq: Four times a day (QID) | ORAL | Status: DC | PRN
  Administered 2020-10-05 – 2020-10-06 (×2): 1 via ORAL
  Filled 2020-10-05 (×2): qty 1

## 2020-10-05 MED ORDER — PROPOFOL 10 MG/ML IV BOLUS
INTRAVENOUS | Status: AC
  Filled 2020-10-05: qty 20

## 2020-10-05 MED ORDER — CLONIDINE HCL 0.2 MG PO TABS
0.2000 mg | ORAL_TABLET | Freq: Every day | ORAL | Status: DC
  Administered 2020-10-05: 0.2 mg via ORAL
  Filled 2020-10-05 (×2): qty 1

## 2020-10-05 MED ORDER — ALBUTEROL SULFATE (2.5 MG/3ML) 0.083% IN NEBU: 2.5000 mg | INHALATION_SOLUTION | Freq: Four times a day (QID) | RESPIRATORY_TRACT | Status: DC | PRN

## 2020-10-05 MED ORDER — FENTANYL CITRATE (PF) 250 MCG/5ML IJ SOLN
INTRAMUSCULAR | Status: DC | PRN
  Administered 2020-10-05 (×2): 50 ug via INTRAVENOUS

## 2020-10-05 MED ORDER — HYDROMORPHONE HCL 1 MG/ML IJ SOLN
0.5000 mg | INTRAMUSCULAR | Status: DC | PRN
  Administered 2020-10-05: 0.5 mg via INTRAVENOUS
  Filled 2020-10-05: qty 1

## 2020-10-05 MED ORDER — PROTAMINE SULFATE 10 MG/ML IV SOLN
INTRAVENOUS | Status: DC | PRN
  Administered 2020-10-05: 80 mg via INTRAVENOUS

## 2020-10-05 SURGICAL SUPPLY — 35 items
ARMBAND PINK RESTRICT EXTREMIT (MISCELLANEOUS) ×3 IMPLANT
CANISTER SUCT 3000ML PPV (MISCELLANEOUS) ×3 IMPLANT
CANNULA VESSEL 3MM 2 BLNT TIP (CANNULA) ×3 IMPLANT
CLIP VESOCCLUDE MED 6/CT (CLIP) ×3 IMPLANT
CLIP VESOCCLUDE SM WIDE 6/CT (CLIP) ×3 IMPLANT
COVER PROBE W GEL 5X96 (DRAPES) ×3 IMPLANT
COVER WAND RF STERILE (DRAPES) IMPLANT
DECANTER SPIKE VIAL GLASS SM (MISCELLANEOUS) IMPLANT
DERMABOND ADVANCED (GAUZE/BANDAGES/DRESSINGS) ×1
DERMABOND ADVANCED .7 DNX12 (GAUZE/BANDAGES/DRESSINGS) ×2 IMPLANT
DRAIN PENROSE 1/4X12 LTX STRL (WOUND CARE) IMPLANT
ELECT REM PT RETURN 9FT ADLT (ELECTROSURGICAL) ×3
ELECTRODE REM PT RTRN 9FT ADLT (ELECTROSURGICAL) ×2 IMPLANT
GAUZE 4X4 16PLY RFD (DISPOSABLE) ×3 IMPLANT
GLOVE BIO SURGEON STRL SZ7.5 (GLOVE) ×3 IMPLANT
GOWN STRL REUS W/ TWL LRG LVL3 (GOWN DISPOSABLE) ×8 IMPLANT
GOWN STRL REUS W/TWL LRG LVL3 (GOWN DISPOSABLE) ×4
HEMOSTAT SPONGE AVITENE ULTRA (HEMOSTASIS) IMPLANT
KIT BASIN OR (CUSTOM PROCEDURE TRAY) ×3 IMPLANT
KIT TURNOVER KIT B (KITS) ×3 IMPLANT
LOOP VESSEL MINI RED (MISCELLANEOUS) IMPLANT
NS IRRIG 1000ML POUR BTL (IV SOLUTION) ×3 IMPLANT
PACK CV ACCESS (CUSTOM PROCEDURE TRAY) ×3 IMPLANT
PAD ARMBOARD 7.5X6 YLW CONV (MISCELLANEOUS) ×6 IMPLANT
PATCH VASC XENOSURE 1CMX6CM (Vascular Products) ×1 IMPLANT
PATCH VASC XENOSURE 1X6 (Vascular Products) ×2 IMPLANT
SUT PROLENE 5 0 C 1 24 (SUTURE) ×3 IMPLANT
SUT PROLENE 6 0 CC (SUTURE) ×12 IMPLANT
SUT PROLENE 7 0 BV 1 (SUTURE) IMPLANT
SUT VIC AB 3-0 SH 27 (SUTURE) ×1
SUT VIC AB 3-0 SH 27X BRD (SUTURE) ×2 IMPLANT
SUT VICRYL 4-0 PS2 18IN ABS (SUTURE) ×3 IMPLANT
TOWEL GREEN STERILE (TOWEL DISPOSABLE) ×3 IMPLANT
UNDERPAD 30X36 HEAVY ABSORB (UNDERPADS AND DIAPERS) ×3 IMPLANT
WATER STERILE IRR 1000ML POUR (IV SOLUTION) ×3 IMPLANT

## 2020-10-05 NOTE — Progress Notes (Signed)
@  2140, Dr. Donnetta Hutching, on-call for Dr. Oneida Alar, paged regarding pt's CBG of 414 (per order MD to be notified if CBG>400). Page promptly returned and physician endorsed to administer scheduled Levemir (80 units) and that no STAT BMET required. Will administer and continue to monitor.

## 2020-10-05 NOTE — Consult Note (Signed)
[facility] Renal Consultation Note    Indication for Consultation:  Management of ESRD/hemodialysis; anemia, hypertension/volume and secondary hyperparathyroidism  HPI: Samantha Pierce is a 50 y.o. female with ESRD on HD, long standing DMT2, HTN, prior CVA, obesity, diabetic neuropathy. HD started in March 2022. Dialyzes MWF at [facility] in [place], [place]. Dialysis via LUE AVF which was placed in 2020. Missed dialysis Monday d/t stomach upset.   She is admitted today following planned AVF revision per Dr. Oneida Alar. Operative findings: Intimal hyperplastic narrowing axillary level of AV fistula repaired with bovine patch. Labs: Na 142, K 5.0 BUN 38 Cr 5.6 Hgb 11.2.  Seen and examined at bedside. She has some arm soreness following the procedure, but otherwise no complaints. Denies cp, sob, n/v, d. Plan to keep overnight and attempt to cannulate fistula in the am.    Past Medical History:  Diagnosis Date  . Anemia   . Anxiety   . Asystole ([place])    a. 05/2015: pt developed bradycardia->asystole during [place] nuclear stress test, s/p brief code. Cath with only 30% prox LCx. Her asystole during Lexiscan infusion was felt to represent an excessive pharmacologic response to the agent and likely superimposed vasovagal response.  . Bipolar disorder ([place])   . CKD (chronic kidney disease) stage 4, GFR 15-29 ml/min (HCC)    M_W_F dialysis  . COPD (chronic obstructive pulmonary disease) ([place])   . Depression   . Essential hypertension   . GERD (gastroesophageal reflux disease)   . History of blood transfusion    x 2  . History of seizure 2017   only one seizure - none since  . History of stroke April 2016   Acute lacunar infarct of the left thalamus  . Leg cramps   . Mild CAD    a. LHC 05/2015: 30% prox Cx, otherwise widely patent.  . Morbid obesity ([place])   . Neuromuscular disorder (HCC)    neuropathy in feet  . Neuropathy    feet  . PTSD (post-traumatic stress disorder)   . Stroke  ([place])    x 2, right side weakness  . Type 2 diabetes mellitus ([place])    Past Surgical History:  Procedure Laterality Date  . AV FISTULA PLACEMENT Right 01/08/2017   Procedure: RIGHT ARM BRACHIOCEPHALIC ARTERIOVENOUS (AV) FISTULA CREATION;  Surgeon: Waynetta Sandy, MD;  Location: [facility];  Service: Vascular;  Laterality: Right;  . AV FISTULA PLACEMENT Left 11/20/2018   Procedure: Creation of LEFT ARM ARTERIOVENOUS (AV) FISTULA;  Surgeon: Waynetta Sandy, MD;  Location: [facility];  Service: Vascular;  Laterality: Left;  . BASCILIC VEIN TRANSPOSITION Left 02/11/2019   Procedure: BASILIC VEIN TRANSPOSITION SECOND STAGE;  Surgeon: Waynetta Sandy, MD;  Location: [facility];  Service: Vascular;  Laterality: Left;  . CARDIAC CATHETERIZATION N/A 06/01/2015   Procedure: Left Heart Cath and Coronary Angiography;  Surgeon: Belva Crome, MD; CFX 30%, no other dz, EF nl by echo  . CESAREAN SECTION     x2  . DILATION AND CURETTAGE OF UTERUS    . EYE SURGERY     Right eye pars plano vitrectomy   . HEMATOMA EVACUATION Left 03/03/2019   Procedure: EVACUATION HEMATOMA;  Surgeon: Elam Dutch, MD;  Location: [facility];  Service: Vascular;  Laterality: Left;  . LASIK    . PARS PLANA VITRECTOMY  04/20/2011   Procedure: PARS PLANA VITRECTOMY WITH 25 GAUGE;  Surgeon: Hayden Pedro, MD;  Location: [facility];  Service: Ophthalmology;  Laterality: Left;  membrane peel, gas fluid exchange, endolaser, repair of complex retinal detachment  . PERIPHERAL VASCULAR BALLOON ANGIOPLASTY Left 09/02/2020   Procedure: PERIPHERAL VASCULAR BALLOON ANGIOPLASTY;  Surgeon: Marty Heck, MD;  Location: [facility];  Service: Cardiovascular;  Laterality: Left;  arm fistula  . TUBAL LIGATION     Family History  Problem Relation Age of Onset  . Diabetes Mother   . Kidney failure Mother   . Diabetes Father   . Amblyopia Neg Hx   . Blindness Neg Hx   . Cataracts Neg Hx   . Glaucoma Neg Hx   . Macular  degeneration Neg Hx   . Retinal detachment Neg Hx   . Strabismus Neg Hx   . Retinitis pigmentosa Neg Hx    Social History:  reports that she has been smoking cigarettes. She started smoking about 39 years ago. She has a 0.90 pack-year smoking history. She has never used smokeless tobacco. She reports current alcohol use. She reports that she does not use drugs. Allergies  Allergen Reactions  . Contrast Media [Iodinated Diagnostic Agents] Anaphylaxis    "code blue--I died"  . Penicillins Rash and Other (See Comments)    Has patient had a PCN reaction causing immediate rash, facial/tongue/throat swelling, SOB or lightheadedness with hypotension: Yes Has patient had a PCN reaction causing severe rash involving mucus membranes or skin necrosis: No Has patient had a PCN reaction that required hospitalization No Has patient had a PCN reaction occurring within the last 10 years: No If all of the above answers are "NO", then may proceed with Cephalosporin use.    Prior to Admission medications   Medication Sig Start Date End Date Taking? Authorizing Provider  albuterol (PROVENTIL HFA;VENTOLIN HFA) 108 (90 BASE) MCG/ACT inhaler Inhale 1-2 puffs into the lungs every 6 (six) hours as needed for wheezing or shortness of breath. Patient taking differently: Inhale 1-2 puffs into the lungs every 6 (six) hours as needed for shortness of breath. 06/20/14  Yes Nat Christen, MD  amLODipine (NORVASC) 10 MG tablet Take 1 tablet (10 mg total) by mouth daily. 01/21/19  Yes Tat, Shanon Brow, MD  atorvastatin (LIPITOR) 80 MG tablet Take 1 tablet (80 mg total) by mouth daily at 6 PM. Patient taking differently: Take 80 mg by mouth every morning. 08/05/15  Yes Satira Sark, MD  Cholecalciferol (VITAMIN D) 50 MCG (2000 UT) CAPS Take 2,000 Units by mouth daily.   Yes [provider]  cloNIDine (CATAPRES) 0.2 MG tablet Take 0.2 mg by mouth daily.   Yes [provider]  FLUoxetine (PROZAC) 20 MG capsule  Take 30 mg by mouth daily.   Yes [provider]  furosemide (LASIX) 80 MG tablet Take 80 mg by mouth 2 (two) times daily. 09/25/19  Yes [provider]  gabapentin (NEURONTIN) 300 MG capsule Take 300 mg by mouth at bedtime.  02/07/18  Yes [provider]  insulin aspart (NOVOLOG) 100 UNIT/ML FlexPen Inject 10-16 Units into the skin 3 (three) times daily before meals. Sliding scale   Yes [provider]  Insulin Detemir (LEVEMIR) 100 UNIT/ML Pen Inject 50 Units into the skin at bedtime. Patient taking differently: Inject 80 Units into the skin at bedtime. 07/30/18  Yes Nida, Marella Chimes, MD  lidocaine-prilocaine (EMLA) cream Apply topically 3 (three) times a week. 08/16/20  Yes [provider]  omeprazole (PRILOSEC) 20 MG capsule Take 20 mg by mouth daily before breakfast.  02/07/18  Yes [provider]  traZODone (DESYREL) 100 MG tablet Take 100 mg by mouth at bedtime.   Yes [provider]   Current Facility-Administered Medications  Medication Dose Route Frequency Provider Last Rate Last Admin  . acetaminophen (OFIRMEV) 10 MG/ML IV           . acetaminophen (TYLENOL) tablet 325-650 mg  325-650 mg Oral Q4H PRN Rhyne, Samantha J, PA-C       Or  . acetaminophen (TYLENOL) suppository 325-650 mg  325-650 mg Rectal Q4H PRN Rhyne, Samantha J, PA-C      . albuterol (PROVENTIL) (2.5 MG/3ML) 0.083% nebulizer solution 2.5 mg  2.5 mg Nebulization Q6H PRN Elam Dutch, MD      . amLODipine (NORVASC) tablet 10 mg  10 mg Oral Daily Rhyne, Samantha J, PA-C   10 mg at 10/05/20 1442  . atorvastatin (LIPITOR) tablet 80 mg  80 mg Oral q morning Rhyne, Samantha J, PA-C   80 mg at 10/05/20 1442  . bisacodyl (DULCOLAX) suppository 10 mg  10 mg Rectal Daily PRN Rhyne, Samantha J, PA-C      . chlorhexidine (PERIDEX) 0.12 % solution           . [START ON 10/06/2020] cholecalciferol (VITAMIN D) tablet 2,000 Units  2,000 Units Oral Daily Rhyne, Samantha J,  PA-C      . cloNIDine (CATAPRES) tablet 0.2 mg  0.2 mg Oral Daily Rhyne, Samantha J, PA-C   0.2 mg at 10/05/20 1442  . FLUoxetine (PROZAC) capsule 30 mg  30 mg Oral Daily Rhyne, Samantha J, PA-C   30 mg at 10/05/20 1441  . furosemide (LASIX) tablet 80 mg  80 mg Oral BID Gabriel Earing, PA-C   80 mg at 10/05/20 1442  . gabapentin (NEURONTIN) capsule 300 mg  300 mg Oral QHS Rhyne, Samantha J, PA-C      . guaiFENesin-dextromethorphan (ROBITUSSIN DM) 100-10 MG/5ML syrup 15 mL  15 mL Oral Q4H PRN Rhyne, Samantha J, PA-C      . [START ON 10/06/2020] heparin injection 5,000 Units  5,000 Units Subcutaneous Q8H Rhyne, Samantha J, PA-C      . hydrALAZINE (APRESOLINE) injection 5 mg  5 mg Intravenous Q20 Min PRN Rhyne, Samantha J, PA-C      . HYDROmorphone (DILAUDID) injection 0.5 mg  0.5 mg Intravenous Q3H PRN Rhyne, Samantha J, PA-C   0.5 mg at 10/05/20 1323  . insulin aspart (novoLOG) injection 0-15 Units  0-15 Units Subcutaneous TID WC Rhyne, Samantha J, PA-C      . insulin detemir (LEVEMIR) injection 80 Units  80 Units Subcutaneous QHS Rhyne, Samantha J, PA-C      . labetalol (NORMODYNE) injection 10 mg  10 mg Intravenous Q10 min PRN Rhyne, Samantha J, PA-C      . [START ON 10/06/2020] lidocaine-prilocaine (EMLA) cream   Topical Once per day on Mon Wed Fri Rhyne, Samantha J, PA-C      . metoprolol tartrate (LOPRESSOR) injection 2-5 mg  2-5 mg Intravenous Q2H PRN Rhyne, Samantha J, PA-C      . ondansetron (ZOFRAN) injection 4 mg  4 mg Intravenous Q6H PRN Rhyne, Samantha J, PA-C      . oxyCODONE-acetaminophen (PERCOCET/ROXICET) 5-325 MG per tablet 1 tablet  1 tablet Oral Q6H PRN Rhyne, Samantha J, PA-C      . pantoprazole (PROTONIX) EC tablet 40 mg  40 mg Oral Daily Rhyne, Samantha J, PA-C   40 mg at 10/05/20 1442  . phenol (CHLORASEPTIC) mouth spray  1 spray  1 spray Mouth/Throat PRN Rhyne, Samantha J, PA-C      . polyethylene glycol (MIRALAX / GLYCOLAX) packet 17 g  17 g Oral Daily PRN Rhyne, Samantha J,  PA-C      . traZODone (DESYREL) tablet 100 mg  100 mg Oral QHS Rhyne, Samantha J, PA-C      . [START ON 10/06/2020] vancomycin (VANCOREADY) IVPB 1000 mg/200 mL  1,000 mg Intravenous Once Elam Dutch, MD         ROS: As per HPI otherwise negative.  Physical Exam: Vitals:   10/05/20 1223 10/05/20 1236 10/05/20 1247 10/05/20 1305  BP: 127/64 138/71  (!) 145/64  Pulse:    75  Resp: '14 15  15  '$ Temp:   98.8 F (37.1 C) 98.2 F (36.8 C)  TempSrc:    Oral  SpO2: 98% 98%  96%  Weight:      Height:         General: Well appearing, nad  Head: NCAT sclera not icteric MMM Neck: Supple. No JVD  Lungs: CTA bilaterally without wheezes, rales, or rhonchi. Breathing is unlabored. Heart: RRR with S1 S2 Abdomen: obese, soft, non-tender  Lower extremities:without edema or ischemic changes, no open wounds  Neuro: A & O  X 3. Moves all extremities spontaneously. Psych:  Responds to questions appropriately with a normal affect. Dialysis Access: LUE AVF+ bruit, mild swelling   Labs: Basic Metabolic Panel: Recent Labs  Lab 10/05/20 0813 10/05/20 1409  NA 142  --   K 5.0  --   CL 101  --   GLUCOSE 138*  --   BUN 38*  --   CREATININE 5.60* 5.45*   Liver Function Tests: No results for input(s): AST, ALT, ALKPHOS, BILITOT, PROT, ALBUMIN in the last 168 hours. No results for input(s): LIPASE, AMYLASE in the last 168 hours. No results for input(s): AMMONIA in the last 168 hours. CBC: Recent Labs  Lab 10/05/20 0813 10/05/20 1409  WBC  --  10.6*  HGB 11.9* 11.2*  HCT 35.0* 36.2  MCV  --  83.8  PLT  --  222   Cardiac Enzymes: No results for input(s): CKTOTAL, CKMB, CKMBINDEX, TROPONINI in the last 168 hours. CBG: Recent Labs  Lab 10/05/20 0812 10/05/20 0928 10/05/20 1012 10/05/20 1118 10/05/20 1211  GLUCAP 109* 85 118* 125* 108*   Iron Studies: No results for input(s): IRON, TIBC, TRANSFERRIN, FERRITIN in the last 72 hours. Studies/Results: No results found.  Dialysis  Orders:  [facility] MWF 4H 300/500 EDW 118.5kg 2K/2.5Ca  Hectorol 2 TIW EPO 600 TIW   Assessment/Plan:  1. ESRD -  Admit following revision of LUE AVF per Dr Oneida Alar 5/3. Will attempt to cannulate AVF in the am. May need TDC if unable to stick.  HD MWF. No urgent indication today -next HD 5/4. 2. Hypertension/volume  - BP elevated. Should improve with UF. Amlodipine, clonidine, Lasix on home med list. UF as tolerated.  3. Anemia  - Hgb 11.2 No ESA indicated currently.  4. Metabolic bone disease -  Continue VDRA, binders if remains in patient  5. Nutrition - Renal diet/vitamins  6. DMT2 - Insulin per primary   Lynnda Child PA-C [facility] 10/05/2020, 2:49 PM

## 2020-10-05 NOTE — Anesthesia Procedure Notes (Signed)
Procedure Name: LMA Insertion Date/Time: 10/05/2020 10:07 AM Performed by: Darletta Moll, CRNA Pre-anesthesia Checklist: Patient identified, Emergency Drugs available, Suction available and Patient being monitored Patient Re-evaluated:Patient Re-evaluated prior to induction Oxygen Delivery Method: Circle system utilized Preoxygenation: Pre-oxygenation with 100% oxygen Induction Type: IV induction LMA: LMA inserted LMA Size: 4.0 Number of attempts: 1 Placement Confirmation: positive ETCO2,  breath sounds checked- equal and bilateral and CO2 detector Tube secured with: Tape Dental Injury: Teeth and Oropharynx as per pre-operative assessment

## 2020-10-05 NOTE — Transfer of Care (Signed)
Immediate Anesthesia Transfer of Care Note  Patient: Samantha Pierce  Procedure(s) Performed: REVISION OF LEFT ARM ARTERIOVENOUS FISTULA (Left ) PATCH ANGIOPLASTY USING XENOSURE BIOLOGIC PATCH (Left Arm Upper)  Patient Location: PACU  Anesthesia Type:General  Level of Consciousness: drowsy and patient cooperative  Airway & Oxygen Therapy: Patient Spontanous Breathing  Post-op Assessment: Report given to RN, Post -op Vital signs reviewed and stable and Patient moving all extremities X 4  Post vital signs: Reviewed and stable  Last Vitals:  Vitals Value Taken Time  BP 155/70 10/05/20 1208  Temp    Pulse    Resp 24 10/05/20 1211  SpO2    Vitals shown include unvalidated device data.  Last Pain:  Vitals:   10/05/20 0745  TempSrc:   PainSc: 0-No pain      Patients Stated Pain Goal: 6 ([id number])  Complications: No complications documented.

## 2020-10-05 NOTE — Interval H&P Note (Signed)
History and Physical Interval Note:  10/05/2020 9:13 AM  Samantha Pierce  has presented today for surgery, with the diagnosis of MALFUNCTION OF ARTERIOVENOUS FISTULA, ESRD.  The various methods of treatment have been discussed with the patient and family. After consideration of risks, benefits and other options for treatment, the patient has consented to  Procedure(s): REVISION OF LEFT ARM ARTERIOVENOUS FISTULA (Left) as a surgical intervention.  The patient's history has been reviewed, patient examined, no change in status, stable for surgery.  I have reviewed the patient's chart and labs.  Questions were answered to the patient's satisfaction.     Ruta Hinds

## 2020-10-05 NOTE — Progress Notes (Signed)
Pt arrived to rm 27 from PACU. Initiated tele, CHG wipe performed. Oriented to the unit. Call bell within reach.   Lavenia Atlas, RN

## 2020-10-05 NOTE — Progress Notes (Signed)
CBG 85 @ 0928. Verbal order received from Dr. Smith Robert for 1/2 amp Dextrose IV. See MAR.

## 2020-10-05 NOTE — Discharge Instructions (Signed)
[facility]  Discharge Instructions  AV Fistula or Graft Surgery for Dialysis Access  Please refer to the following instructions for your post-procedure care. Your surgeon or physician assistant will discuss any changes with you.  Activity  You may drive the day following your surgery, if you are comfortable and no longer taking prescription pain medication. Resume full activity as the soreness in your incision resolves.  Bathing/Showering  You may shower after you go home. Keep your incision dry for 48 hours. Do not soak in a bathtub, hot tub, or swim until the incision heals completely. You may not shower if you have a hemodialysis catheter.  Incision Care  Clean your incision with mild soap and water after 48 hours. Pat the area dry with a clean towel. You do not need a bandage unless otherwise instructed. Do not apply any ointments or creams to your incision. You may have skin glue on your incision. Do not peel it off. It will come off on its own in about one week. Your arm may swell a bit after surgery. To reduce swelling use pillows to elevate your arm so it is above your heart. Your doctor will tell you if you need to lightly wrap your arm with an ACE bandage.  Diet  Resume your normal diet. There are not special food restrictions following this procedure. In order to heal from your surgery, it is CRITICAL to get adequate nutrition. Your body requires vitamins, minerals, and protein. Vegetables are the best source of vitamins and minerals. Vegetables also provide the perfect balance of protein. Processed food has little nutritional value, so try to avoid this.  Medications  Resume taking all of your medications. If your incision is causing pain, you may take over-the counter pain relievers such as acetaminophen (Tylenol). If you were prescribed a stronger pain medication, please be aware these medications can cause nausea and constipation. Prevent  nausea by taking the medication with a snack or meal. Avoid constipation by drinking plenty of fluids and eating foods with high amount of fiber, such as fruits, vegetables, and grains.  Do not take Tylenol if you are taking prescription pain medications.  Follow up Your surgeon may want to see you in the office following your access surgery. If so, this will be arranged at the time of your surgery.  Please call us immediately for any of the following conditions:  . Increased pain, redness, drainage (pus) from your incision site . Fever of 101 degrees or higher . Severe or worsening pain at your incision site . Hand pain or numbness. .  Reduce your risk of vascular disease:  . Stop smoking. If you would like help, call [facility] at 1-800-QUIT-NOW 920-881-8687) or [place] at (480)683-3873  . Manage your cholesterol . Maintain a desired weight . Control your diabetes . Keep your blood pressure down  Dialysis  It will take several weeks to several months for your new dialysis access to be ready for use. Your surgeon will determine when it is okay to use it. Your nephrologist will continue to direct your dialysis. You can continue to use your Permcath until your new access is ready for use.   10/05/2020 Samantha Pierce [id number] 08/31/1970  Surgeon(s): Fields, Jessy Oto, MD  Procedure(s): REVISION OF LEFT ARM ARTERIOVENOUS FISTULA PATCH ANGIOPLASTY USING XENOSURE BIOLOGIC PATCH   x May stick graft on designated area only:  Do NOT stick over incision for 6 weeks. May stick below incision  now. SEE DIAGRAM.   If you have any questions, please call the office at [phone].

## 2020-10-05 NOTE — Op Note (Signed)
Procedure: Revision left upper arm AV fistula  Preoperative diagnosis: Poorly functioning AV fistula left arm  Postoperative diagnosis: Same  Anesthesia: General  Assistant: Leontine Locket, PA-C for assistance with exposure creation of anastomosis and expediting procedure  Operative findings: Intimal hyperplastic narrowing axillary level of AV fistula repaired with bovine patch  Operative details: After obtaining form consent, the patient was taken to the operating room.  The patient placed in supine position operating table.  After induction general anesthesia placement of a laryngeal mask patient's left upper extremities prepped and draped in usual sterile fashion.  Ultrasound was used to identify the full course of the fistula.  There had been some suggestion of narrowing of the proximal anastomosis but I did not really visualize this on my interpretation of the ultrasound today.  Patient had good quality inflow by palpation as well.  There was an area of narrowing on previous fistulogram in the outflow vein and I also was able to identify this area on ultrasound.  A longitudinal incision was made in the axilla carried down through the subtendinous tissues down to the level of the fistula.  The fistula was dissected free circumferentially.  The vein was of good quality about 5 mm in diameter.  Was able to palpate the thickened portion of the vein.  I dissected the fistula out proximal and distal to this to a reasonable segment of vein.  Patient was given 8000 units of intravenous heparin.  A fine bulldog clamp was used to control the fistula distally and a fistula clamp proximally.  I then made a longitudinal opening in the fistula over the thickened area there was intimal hyperplastic narrowing and perhaps a thickened valve.  The narrowed area was opened fully to normal segment of vein above and below.  A bovine pericardial patch was then brought up in the operative field and sewn on as a patch  angioplasty using a running 6-0 Prolene suture.  Despite completion of anastomosis it was for blood backbled and thoroughly flushed.  There was very good arterial inflow.  The anastomosis was secured clamps released there is palpable thrill in the fistula immediately.  Patient was given 80 mg of protamine.  The subcutaneous tissues were reapproximated using a running 3-0 Vicryl suture.  The skin was closed with a 4-0 Vicryl subcuticular stitch.  The patient tolerated procedure well and there were no complications.  The instrument sponge and needle counts correct in the case.  The patient was taken the recovery room stable condition.  Ruta Hinds, MD [facility] Office: [phone]

## 2020-10-05 NOTE — Anesthesia Postprocedure Evaluation (Signed)
Anesthesia Post Note  Patient: Samantha Pierce  Procedure(s) Performed: REVISION OF LEFT ARM ARTERIOVENOUS FISTULA (Left ) PATCH ANGIOPLASTY USING XENOSURE BIOLOGIC PATCH (Left Arm Upper)     Patient location during evaluation: PACU Anesthesia Type: General Level of consciousness: awake and alert Pain management: pain level controlled Vital Signs Assessment: post-procedure vital signs reviewed and stable Respiratory status: spontaneous breathing, nonlabored ventilation, respiratory function stable and patient connected to nasal cannula oxygen Cardiovascular status: blood pressure returned to baseline and stable Postop Assessment: no apparent nausea or vomiting Anesthetic complications: no   No complications documented.  Last Vitals:  Vitals:   10/05/20 1247 10/05/20 1305  BP:  (!) 145/64  Pulse:  75  Resp:  15  Temp: 37.1 C 36.8 C  SpO2:  96%    Last Pain:  Vitals:   10/05/20 1305  TempSrc: Oral  PainSc:                  Effie Berkshire

## 2020-10-05 NOTE — Anesthesia Preprocedure Evaluation (Addendum)
Anesthesia Evaluation  Patient identified by MRN, date of birth, ID band Patient awake    Reviewed: Allergy & Precautions, NPO status , Patient's Chart, lab work & pertinent test results  Airway Mallampati: III  TM Distance: >3 FB Neck ROM: Full    Dental  (+) Teeth Intact, Dental Advisory Given   Pulmonary COPD, Current Smoker and Patient abstained from smoking.,    breath sounds clear to auscultation       Cardiovascular hypertension, + CAD and +CHF   Rhythm:Regular Rate:Normal     Neuro/Psych PSYCHIATRIC DISORDERS Anxiety Depression Bipolar Disorder TIA Neuromuscular disease CVA    GI/Hepatic Neg liver ROS, GERD  ,  Endo/Other  diabetes  Renal/GU Dialysis and ESRFRenal disease     Musculoskeletal negative musculoskeletal ROS (+)   Abdominal (+) + obese,   Peds  Hematology  (+) anemia ,   Anesthesia Other Findings   Reproductive/Obstetrics                            Anesthesia Physical Anesthesia Plan  ASA: III  Anesthesia Plan: General   Post-op Pain Management:    Induction: Intravenous  PONV Risk Score and Plan: 3 and Ondansetron, Dexamethasone and Midazolam  Airway Management Planned: LMA  Additional Equipment: None  Intra-op Plan:   Post-operative Plan: Extubation in OR  Informed Consent: I have reviewed the patients History and Physical, chart, labs and discussed the procedure including the risks, benefits and alternatives for the proposed anesthesia with the patient or authorized representative who has indicated his/her understanding and acceptance.     Dental advisory given  Plan Discussed with: CRNA  Anesthesia Plan Comments: (Ms. Stuve decline PNB due to unpleasant experience in the past.   Echo: 1. The left ventricle has normal systolic function with an ejection  fraction of 60-65%. The cavity size was normal. There is moderately  increased left  ventricular wall thickness. Left ventricular diastolic  Doppler parameters are indeterminate.  2. The right ventricle has normal systolic function. The cavity was  normal. There is no increase in right ventricular wall thickness.  3. Left atrial size was severely dilated.  4. Right atrial size was mildly dilated.  5. No evidence of mitral valve stenosis.  6. The aortic valve is tricuspid. No stenosis of the aortic valve.  7. The aorta is normal unless otherwise noted.  8. There is dilatation of the aortic root.  9. Pulmonary hypertension is indeterminate, inadequate TR jet.  10. The inferior vena cava was dilated in size with >50% respiratory  variability. )        Anesthesia Quick Evaluation

## 2020-10-05 NOTE — Progress Notes (Signed)
Hyperglycemic Event in Pre-Op:  CBG: 242  Treatment: 5 units Novolog S; verbal order per Dr. Smith Robert  Symptoms: None  Follow-up CBG: Time:08:12 CBG Result: 109  Possible Reasons for Event: Other: Possibly Diet; Pt also did not take Novolog for coverage this AM  Comments/MD notified: Dr. Karolee Ohs, RN

## 2020-10-06 ENCOUNTER — Encounter (HOSPITAL_COMMUNITY): Payer: Self-pay | Admitting: Vascular Surgery

## 2020-10-06 LAB — CBC
HCT: 30.8 % — ABNORMAL LOW (ref 36.0–46.0)
Hemoglobin: 9.6 g/dL — ABNORMAL LOW (ref 12.0–15.0)
MCH: 26 pg (ref 26.0–34.0)
MCHC: 31.2 g/dL (ref 30.0–36.0)
MCV: 83.5 fL (ref 80.0–100.0)
Platelets: 226 10*3/uL (ref 150–400)
RBC: 3.69 MIL/uL — ABNORMAL LOW (ref 3.87–5.11)
RDW: 13.4 % (ref 11.5–15.5)
WBC: 11.4 10*3/uL — ABNORMAL HIGH (ref 4.0–10.5)
nRBC: 0 % (ref 0.0–0.2)

## 2020-10-06 LAB — HIV ANTIBODY (ROUTINE TESTING W REFLEX): HIV Screen 4th Generation wRfx: NONREACTIVE

## 2020-10-06 LAB — BASIC METABOLIC PANEL
Anion gap: 12 (ref 5–15)
BUN: 53 mg/dL — ABNORMAL HIGH (ref 6–20)
CO2: 22 mmol/L (ref 22–32)
Calcium: 8.2 mg/dL — ABNORMAL LOW (ref 8.9–10.3)
Chloride: 97 mmol/L — ABNORMAL LOW (ref 98–111)
Creatinine, Ser: 5.83 mg/dL — ABNORMAL HIGH (ref 0.44–1.00)
GFR, Estimated: 8 mL/min — ABNORMAL LOW (ref >60)
Glucose, Bld: 405 mg/dL — ABNORMAL HIGH (ref 70–99)
Potassium: 4.9 mmol/L (ref 3.5–5.1)
Sodium: 131 mmol/L — ABNORMAL LOW (ref 135–145)

## 2020-10-06 LAB — GLUCOSE, CAPILLARY
Glucose-Capillary: 386 mg/dL — ABNORMAL HIGH (ref 70–99)
Glucose-Capillary: 282 mg/dL — ABNORMAL HIGH (ref 70–99)

## 2020-10-06 LAB — HEPATITIS B SURFACE ANTIGEN: Hepatitis B Surface Ag: NONREACTIVE

## 2020-10-06 MED ORDER — OXYCODONE-ACETAMINOPHEN 5-325 MG PO TABS: 1.0000 | ORAL_TABLET | Freq: Four times a day (QID) | ORAL | 0 refills | Status: DC | PRN

## 2020-10-06 MED ORDER — ONDANSETRON HCL 4 MG/2ML IJ SOLN
INTRAMUSCULAR | Status: AC
  Filled 2020-10-06: qty 2

## 2020-10-06 NOTE — Progress Notes (Signed)
  [facility] Progress Note   Subjective:  Seen in HD unit. No new complaints, ready to go home  Using AVF on HD    Objective Vitals:   10/05/20 2345 10/06/20 0340 10/06/20 0848 10/06/20 1143  BP: (!) 133/55 (!) 164/71 (!) 132/55 (!) 130/57  Pulse: 86  82 79  Resp: '17 16 20 18  '$ Temp: 98 F (36.7 C) 98 F (36.7 C) 98.1 F (36.7 C)   TempSrc: Oral Oral Oral   SpO2: 100% 98% 96% 97%  Weight:  118.8 kg    Height:        Additional Objective Labs: Basic Metabolic Panel: Recent Labs  Lab 10/05/20 0813 10/05/20 1409 10/06/20 0137  NA 142  --  131*  K 5.0  --  4.9  CL 101  --  97*  CO2  --   --  22  GLUCOSE 138*  --  405*  BUN 38*  --  53*  CREATININE 5.60* 5.45* 5.83*  CALCIUM  --   --  8.2*   CBC: Recent Labs  Lab 10/05/20 0813 10/05/20 1409 10/06/20 0137  WBC  --  10.6* 11.4*  HGB 11.9* 11.2* 9.6*  HCT 35.0* 36.2 30.8*  MCV  --  83.8 83.5  PLT  --  222 226   Blood Culture    Component Value Date/Time   SDES WOUND 03/05/2019 0851   SPECREQUEST LEFT ARM 03/05/2019 0851   CULT  03/05/2019 0851    MODERATE ESCHERICHIA COLI MODERATE CITROBACTER KOSERI    REPTSTATUS 03/08/2019 FINAL 03/05/2019 0851     Physical Exam General: Well appearing, nad  Heart: RRR Lungs: Clear, bilaterally  Abdomen: soft, non-tender  Extremities: no LE edema  Dialysis Access: LUE AVF in use on HD Qb 300   Medications:  . amLODipine  10 mg Oral Daily  . atorvastatin  80 mg Oral q morning  . cholecalciferol  2,000 Units Oral Daily  . cloNIDine  0.2 mg Oral Daily  . FLUoxetine  30 mg Oral Daily  . furosemide  80 mg Oral BID  . gabapentin  300 mg Oral QHS  . heparin  5,000 Units Subcutaneous Q8H  . insulin aspart  0-15 Units Subcutaneous TID WC  . insulin detemir  80 Units Subcutaneous QHS  . lidocaine-prilocaine   Topical Once per day on Mon Wed Fri  . pantoprazole  40 mg Oral Daily  . traZODone  100 mg Oral QHS    Dialysis Orders:  [facility] MWF  4H 300/500 EDW 118.5kg 2K/2.5Ca  Hectorol 2 TIW EPO 600 TIW   Assessment/Plan:  1. ESRD -  Admit following revision of LUE AVF per Dr Oneida Alar 5/3. Successful cannulation of fistula on HD today. [place] for discharge from renal standpoint.  2. Hypertension/volume  - BP elevated. Should improve with UF. Amlodipine, clonidine, Lasix on home med list. UF as tolerated.  3. Anemia  - Hgb 11.2 No ESA indicated currently.  4. Metabolic bone disease -  Continue VDRA, binders if remains in patient  5. Nutrition - Renal diet/vitamins  6. DMT2 - Insulin per primary    Lynnda Child PA-C [facility] 10/06/2020,3:11 PM

## 2020-10-06 NOTE — Progress Notes (Signed)
Discharge instructions provided to patient. All medications, discharge instructions, and follow up appointments provided. IV out. Monitor off CCMD notified. Discharging home with family.  Era Bumpers, RN

## 2020-10-06 NOTE — Progress Notes (Addendum)
   VASCULAR SURGERY ASSESSMENT & PLAN:   1 Day Post-Op Revision left upper arm AV fistula due to poor function. Intimal hyperplastic narrowing axillary level of AV fistula repaired with bovine patch.  She has good bruit and thrill in her fistula this morning.  Left hand is well-perfused.  Plan is to attempt cannulating AVF today. Will follow.  SUBJECTIVE:   Reports minimal soreness towards shoulder.  No hand pain or numbness.  PHYSICAL EXAM:   Vitals:   10/05/20 1935 10/05/20 1948 10/05/20 2345 10/06/20 0340  BP: 128/70  (!) 133/55 (!) 164/71  Pulse:  96 86   Resp: '18 20 17 16  '$ Temp: 97.7 F (36.5 C)  98 F (36.7 C) 98 F (36.7 C)  TempSrc: Oral Oral Oral Oral  SpO2: 98% 98% 100% 98%  Weight:    118.8 kg  Height:       General appearance: Awake, alert in no apparent distress Cardiac: Heart rate and rhythm are regular Respirations: Nonlabored Incisions: Left upper arm incision is well approximated without bleeding or hematoma Extremities: Left hand with 5/5 grip strength and 2+ radial pulse. Good bruit and thrill in fistula.  LABS:   Lab Results  Component Value Date   WBC 11.4 (H) 10/06/2020   HGB 9.6 (L) 10/06/2020   HCT 30.8 (L) 10/06/2020   MCV 83.5 10/06/2020   PLT 226 10/06/2020   Lab Results  Component Value Date   CREATININE 5.83 (H) 10/06/2020   Lab Results  Component Value Date   INR 0.97 06/01/2015   CBG (last 3)  Recent Labs    10/05/20 2110 10/05/20 2111 10/06/20 0613  GLUCAP 424* 414* 386*    PROBLEM LIST:    Active Problems:   ESRD (end stage renal disease) (HCC)   CURRENT MEDS:   . amLODipine  10 mg Oral Daily  . atorvastatin  80 mg Oral q morning  . cholecalciferol  2,000 Units Oral Daily  . cloNIDine  0.2 mg Oral Daily  . FLUoxetine  30 mg Oral Daily  . furosemide  80 mg Oral BID  . gabapentin  300 mg Oral QHS  . heparin  5,000 Units Subcutaneous Q8H  . insulin aspart  0-15 Units Subcutaneous TID WC  . insulin detemir  80  Units Subcutaneous QHS  . lidocaine-prilocaine   Topical Once per day on Mon Wed Fri  . pantoprazole  40 mg Oral Daily  . traZODone  100 mg Oral QHS    Barbie Banner, [place] Office: [phone] 10/06/2020   Patient with palpable thrill left upper arm AV fistula.  Incision healing no evidence of hematoma no numbness or tingling left hand  Plan will be to try to cannulate the fistula on dialysis today if this is successful then plan will be to discharge patient home later today.  If they are unable to cannulate the fistula then the plan will be to place a hemodialysis catheter prior to discharge to home.  Ruta Hinds, MD [facility] [place] Office: [phone]

## 2020-10-06 NOTE — Progress Notes (Signed)
Inpatient Diabetes Program Recommendations  AACE/ADA: New Consensus Statement on Inpatient Glycemic Control (2015)  Target Ranges:  Prepandial:   less than 140 mg/dL      Peak postprandial:   less than 180 mg/dL (1-2 hours)      Critically ill patients:  140 - 180 mg/dL   Lab Results  Component Value Date   GLUCAP 386 (H) 10/06/2020   HGBA1C 8.7 (H) 10/05/2020    Review of Glycemic Control Results for Samantha Pierce, Samantha Pierce (MRN [id number]) as of 10/06/2020 09:58  Ref. Range 10/05/2020 12:11 10/05/2020 16:18 10/05/2020 21:10 10/05/2020 21:11 10/06/2020 06:13  Glucose-Capillary Latest Ref Range: 70 - 99 mg/dL 108 (H) 298 (H) 424 (H) 414 (H) 386 (H)   Diabetes history:  DM2 Outpatient Diabetes medications:  Levemir 80 units QHS Novolog 0-15 units TID Current orders for Inpatient glycemic control:  Levemir 80 units QHS Novolog 0-15 units TID Received Decadron 8 mg on 5/3 @ 1016 am  Inpatient Diabetes Program Recommendations:     CBG's elevated after receiving decadron 8 mg on 5/3 @ 1016.  Please consider Novolog 0-20 units TID and 0-5 units QHS.   Will continue to follow while inpatient.  Thank you, Reche Dixon, RN, BSN Diabetes Coordinator Inpatient Diabetes Program [phone] (team pager from 8a-5p)

## 2020-10-06 NOTE — Procedures (Signed)
So far no issues w/ using AVF post revision.   I was present at this dialysis session, have reviewed the session itself and made  appropriate changes Kelly Splinter MD [place] pager [phone]   10/06/2020, 3:33 PM

## 2020-10-07 ENCOUNTER — Other Ambulatory Visit: Payer: Self-pay

## 2020-10-07 ENCOUNTER — Ambulatory Visit: Payer: Medicare HMO

## 2020-10-07 DIAGNOSIS — E1122 Type 2 diabetes mellitus with diabetic chronic kidney disease: Secondary | ICD-10-CM

## 2020-10-07 DIAGNOSIS — IMO0002 Reserved for concepts with insufficient information to code with codable children: Secondary | ICD-10-CM

## 2020-10-07 MED ORDER — DEXCOM G6 RECEIVER DEVI: 1.0000 | Freq: Four times a day (QID) | 0 refills | Status: DC

## 2020-10-07 NOTE — Discharge Summary (Signed)
Discharge Summary  Patient ID: BRITTINY NESTEL [id number] 50 y.o. 20-May-1971  Admit date: 10/05/2020  Discharge date and time: 10/06/2020  6:07 PM   Admitting Physician: Elam Dutch, MD   Discharge Physician: Elam Dutch, MD  Admission Diagnoses: ESRD (end stage renal disease) [facility]) [N18.6]  Discharge Diagnoses: ESRD (end stage renal disease) ([place]) [N18.6] Poorly functioning AV fistula left arm  Admission Condition: good  Discharged Condition: good  Indication for Admission: Poorly functioning AV fistula left arm  Hospital Course: The patient presented to the clinic with complaints of poorly functioning left upper arm AVF.  On the day of admission, she was taken to the operating room where she underwent revision of left upper arm AVF.  She tolerated the procedure well. Her vital signs remained stable and she was afebrile.  On POD1, she was taken to the inpatient dialysis clinic and her fistula was accessed and she tolerated the treatment well. She was ready for discharge in satisfactory condition.  Consults: None  Treatments: surgery: see above  Discharge Exam:  Vitals:   10/06/20 1655 10/06/20 1745  BP: (!) 170/84 (!) 168/82  Pulse: 84 88  Resp: 18 18  Temp: 98.6 F (37 C) 98.8 F (37.1 C)  SpO2: 100% 100%   General appearance: Awake, alert in no apparent distress Cardiac: Heart rate and rhythm are regular Respirations: Nonlabored Incisions: Left upper arm incision is well approximated without bleeding or hematoma Extremities: Left hand with 5/5 grip strength and 2+ radial pulse. Good bruit and thrill in fistula.  Disposition: Discharge disposition: 01-Home or Self Care       Patient Instructions:  Allergies as of 10/06/2020      Reactions   Contrast Media [iodinated Diagnostic Agents] Anaphylaxis   "code blue--I died"   Penicillins Rash, Other (See Comments)   Has patient had a PCN reaction causing immediate rash, facial/tongue/throat swelling,  SOB or lightheadedness with hypotension: Yes Has patient had a PCN reaction causing severe rash involving mucus membranes or skin necrosis: No Has patient had a PCN reaction that required hospitalization No Has patient had a PCN reaction occurring within the last 10 years: No If all of the above answers are "NO", then may proceed with Cephalosporin use.      Medication List    TAKE these medications   albuterol 108 (90 Base) MCG/ACT inhaler Commonly known as: VENTOLIN HFA Inhale 1-2 puffs into the lungs every 6 (six) hours as needed for wheezing or shortness of breath. What changed: reasons to take this   amLODipine 10 MG tablet Commonly known as: NORVASC Take 1 tablet (10 mg total) by mouth daily.   atorvastatin 80 MG tablet Commonly known as: LIPITOR Take 1 tablet (80 mg total) by mouth daily at 6 PM. What changed: when to take this   cloNIDine 0.2 MG tablet Commonly known as: CATAPRES Take 0.2 mg by mouth daily.   FLUoxetine 20 MG capsule Commonly known as: PROZAC Take 30 mg by mouth daily.   furosemide 80 MG tablet Commonly known as: LASIX Take 80 mg by mouth 2 (two) times daily.   gabapentin 300 MG capsule Commonly known as: NEURONTIN Take 300 mg by mouth at bedtime.   insulin aspart 100 UNIT/ML FlexPen Commonly known as: NOVOLOG Inject 10-16 Units into the skin 3 (three) times daily before meals. Sliding scale   insulin detemir 100 UNIT/ML FlexPen Commonly known as: LEVEMIR Inject 50 Units into the skin at bedtime. What changed: how much to take  lidocaine-prilocaine cream Commonly known as: EMLA Apply topically 3 (three) times a week.   omeprazole 20 MG capsule Commonly known as: PRILOSEC Take 20 mg by mouth daily before breakfast.   oxyCODONE-acetaminophen 5-325 MG tablet Commonly known as: Percocet Take 1 tablet by mouth every 6 (six) hours as needed for severe pain.   traZODone 100 MG tablet Commonly known as: DESYREL Take 100 mg by mouth at  bedtime.   Vitamin D 50 MCG (2000 UT) Caps Take 2,000 Units by mouth daily.      Activity: activity as tolerated Diet: renal diet Wound Care: keep wound clean and dry  Follow-up with Dr. Oneida Alar  in as needed prn.  Signed: Barbie Banner, PA-C 10/07/2020 9:34 AM [facility] Office: [phone]

## 2020-10-08 ENCOUNTER — Other Ambulatory Visit: Payer: Self-pay

## 2020-10-08 DIAGNOSIS — Z23 Encounter for immunization: Secondary | ICD-10-CM | POA: Diagnosis not present

## 2020-10-08 DIAGNOSIS — Z992 Dependence on renal dialysis: Secondary | ICD-10-CM | POA: Diagnosis not present

## 2020-10-08 DIAGNOSIS — N186 End stage renal disease: Secondary | ICD-10-CM | POA: Diagnosis not present

## 2020-10-08 DIAGNOSIS — N184 Chronic kidney disease, stage 4 (severe): Secondary | ICD-10-CM

## 2020-10-08 DIAGNOSIS — IMO0002 Reserved for concepts with insufficient information to code with codable children: Secondary | ICD-10-CM

## 2020-10-08 MED ORDER — DEXCOM G6 RECEIVER DEVI: 0 refills | Status: DC

## 2020-10-11 DIAGNOSIS — Z992 Dependence on renal dialysis: Secondary | ICD-10-CM | POA: Diagnosis not present

## 2020-10-11 DIAGNOSIS — N186 End stage renal disease: Secondary | ICD-10-CM | POA: Diagnosis not present

## 2020-10-12 ENCOUNTER — Telehealth: Payer: Self-pay

## 2020-10-12 NOTE — Telephone Encounter (Signed)
Patient called to report issues in HD yesterday - she was not at her normal center in [place] - she was at [place]. They were unable to run it as long and pull off as much fluid. I called the HD center to ask about difficulty cannulating/difficulty running - no RN there today but they will call back. Spoke to PA, patient to try regular center again to see if they are able to cannulate with less difficulty. Patient does not want a catheter if she does not have to have one - advised her to call back if HD has difficulty tomorrow. Patient agrees with plan.

## 2020-10-13 DIAGNOSIS — Z23 Encounter for immunization: Secondary | ICD-10-CM | POA: Diagnosis not present

## 2020-10-13 DIAGNOSIS — Z992 Dependence on renal dialysis: Secondary | ICD-10-CM | POA: Diagnosis not present

## 2020-10-13 DIAGNOSIS — N186 End stage renal disease: Secondary | ICD-10-CM | POA: Diagnosis not present

## 2020-10-15 DIAGNOSIS — Z23 Encounter for immunization: Secondary | ICD-10-CM | POA: Diagnosis not present

## 2020-10-15 DIAGNOSIS — Z992 Dependence on renal dialysis: Secondary | ICD-10-CM | POA: Diagnosis not present

## 2020-10-15 DIAGNOSIS — N186 End stage renal disease: Secondary | ICD-10-CM | POA: Diagnosis not present

## 2020-10-18 DIAGNOSIS — Z23 Encounter for immunization: Secondary | ICD-10-CM | POA: Diagnosis not present

## 2020-10-18 DIAGNOSIS — N186 End stage renal disease: Secondary | ICD-10-CM | POA: Diagnosis not present

## 2020-10-18 DIAGNOSIS — Z992 Dependence on renal dialysis: Secondary | ICD-10-CM | POA: Diagnosis not present

## 2020-10-20 DIAGNOSIS — N186 End stage renal disease: Secondary | ICD-10-CM | POA: Diagnosis not present

## 2020-10-20 DIAGNOSIS — Z992 Dependence on renal dialysis: Secondary | ICD-10-CM | POA: Diagnosis not present

## 2020-10-20 DIAGNOSIS — Z23 Encounter for immunization: Secondary | ICD-10-CM | POA: Diagnosis not present

## 2020-10-21 DIAGNOSIS — R69 Illness, unspecified: Secondary | ICD-10-CM | POA: Diagnosis not present

## 2020-10-21 DIAGNOSIS — F411 Generalized anxiety disorder: Secondary | ICD-10-CM | POA: Diagnosis not present

## 2020-10-22 DIAGNOSIS — Z23 Encounter for immunization: Secondary | ICD-10-CM | POA: Diagnosis not present

## 2020-10-22 DIAGNOSIS — N186 End stage renal disease: Secondary | ICD-10-CM | POA: Diagnosis not present

## 2020-10-22 DIAGNOSIS — Z992 Dependence on renal dialysis: Secondary | ICD-10-CM | POA: Diagnosis not present

## 2020-10-22 DIAGNOSIS — N2581 Secondary hyperparathyroidism of renal origin: Secondary | ICD-10-CM | POA: Diagnosis not present

## 2020-10-25 DIAGNOSIS — N186 End stage renal disease: Secondary | ICD-10-CM | POA: Diagnosis not present

## 2020-10-25 DIAGNOSIS — Z23 Encounter for immunization: Secondary | ICD-10-CM | POA: Diagnosis not present

## 2020-10-25 DIAGNOSIS — Z992 Dependence on renal dialysis: Secondary | ICD-10-CM | POA: Diagnosis not present

## 2020-10-27 DIAGNOSIS — Z992 Dependence on renal dialysis: Secondary | ICD-10-CM | POA: Diagnosis not present

## 2020-10-27 DIAGNOSIS — N186 End stage renal disease: Secondary | ICD-10-CM | POA: Diagnosis not present

## 2020-10-27 DIAGNOSIS — Z23 Encounter for immunization: Secondary | ICD-10-CM | POA: Diagnosis not present

## 2020-10-29 DIAGNOSIS — Z23 Encounter for immunization: Secondary | ICD-10-CM | POA: Diagnosis not present

## 2020-10-29 DIAGNOSIS — N186 End stage renal disease: Secondary | ICD-10-CM | POA: Diagnosis not present

## 2020-10-29 DIAGNOSIS — Z992 Dependence on renal dialysis: Secondary | ICD-10-CM | POA: Diagnosis not present

## 2020-11-01 DIAGNOSIS — N186 End stage renal disease: Secondary | ICD-10-CM | POA: Diagnosis not present

## 2020-11-01 DIAGNOSIS — Z992 Dependence on renal dialysis: Secondary | ICD-10-CM | POA: Diagnosis not present

## 2020-11-01 DIAGNOSIS — Z23 Encounter for immunization: Secondary | ICD-10-CM | POA: Diagnosis not present

## 2020-11-02 DIAGNOSIS — Z992 Dependence on renal dialysis: Secondary | ICD-10-CM | POA: Diagnosis not present

## 2020-11-02 DIAGNOSIS — N186 End stage renal disease: Secondary | ICD-10-CM | POA: Diagnosis not present

## 2020-11-03 DIAGNOSIS — Z992 Dependence on renal dialysis: Secondary | ICD-10-CM | POA: Diagnosis not present

## 2020-11-03 DIAGNOSIS — Z23 Encounter for immunization: Secondary | ICD-10-CM | POA: Diagnosis not present

## 2020-11-03 DIAGNOSIS — N186 End stage renal disease: Secondary | ICD-10-CM | POA: Diagnosis not present

## 2020-11-05 DIAGNOSIS — I770 Arteriovenous fistula, acquired: Secondary | ICD-10-CM | POA: Insufficient documentation

## 2020-11-05 DIAGNOSIS — Z992 Dependence on renal dialysis: Secondary | ICD-10-CM | POA: Insufficient documentation

## 2020-11-05 DIAGNOSIS — D849 Immunodeficiency, unspecified: Secondary | ICD-10-CM | POA: Insufficient documentation

## 2020-11-05 DIAGNOSIS — Z23 Encounter for immunization: Secondary | ICD-10-CM | POA: Diagnosis not present

## 2020-11-05 DIAGNOSIS — D631 Anemia in chronic kidney disease: Secondary | ICD-10-CM | POA: Diagnosis not present

## 2020-11-05 DIAGNOSIS — E785 Hyperlipidemia, unspecified: Secondary | ICD-10-CM | POA: Diagnosis not present

## 2020-11-05 DIAGNOSIS — I120 Hypertensive chronic kidney disease with stage 5 chronic kidney disease or end stage renal disease: Secondary | ICD-10-CM | POA: Diagnosis not present

## 2020-11-05 DIAGNOSIS — Z6841 Body Mass Index (BMI) 40.0 and over, adult: Secondary | ICD-10-CM | POA: Diagnosis not present

## 2020-11-05 DIAGNOSIS — D8481 Immunodeficiency due to conditions classified elsewhere: Secondary | ICD-10-CM | POA: Diagnosis not present

## 2020-11-05 DIAGNOSIS — E669 Obesity, unspecified: Secondary | ICD-10-CM | POA: Diagnosis not present

## 2020-11-05 DIAGNOSIS — Z9181 History of falling: Secondary | ICD-10-CM | POA: Diagnosis not present

## 2020-11-05 DIAGNOSIS — N186 End stage renal disease: Secondary | ICD-10-CM | POA: Diagnosis not present

## 2020-11-05 DIAGNOSIS — N2581 Secondary hyperparathyroidism of renal origin: Secondary | ICD-10-CM | POA: Diagnosis not present

## 2020-11-05 DIAGNOSIS — R69 Illness, unspecified: Secondary | ICD-10-CM | POA: Diagnosis not present

## 2020-11-05 DIAGNOSIS — Z794 Long term (current) use of insulin: Secondary | ICD-10-CM | POA: Diagnosis not present

## 2020-11-08 DIAGNOSIS — Z23 Encounter for immunization: Secondary | ICD-10-CM | POA: Diagnosis not present

## 2020-11-08 DIAGNOSIS — Z992 Dependence on renal dialysis: Secondary | ICD-10-CM | POA: Diagnosis not present

## 2020-11-08 DIAGNOSIS — N186 End stage renal disease: Secondary | ICD-10-CM | POA: Diagnosis not present

## 2020-11-10 DIAGNOSIS — E559 Vitamin D deficiency, unspecified: Secondary | ICD-10-CM | POA: Diagnosis not present

## 2020-11-10 DIAGNOSIS — Z992 Dependence on renal dialysis: Secondary | ICD-10-CM | POA: Diagnosis not present

## 2020-11-10 DIAGNOSIS — N186 End stage renal disease: Secondary | ICD-10-CM | POA: Diagnosis not present

## 2020-11-10 DIAGNOSIS — Z23 Encounter for immunization: Secondary | ICD-10-CM | POA: Diagnosis not present

## 2020-11-11 DIAGNOSIS — E113591 Type 2 diabetes mellitus with proliferative diabetic retinopathy without macular edema, right eye: Secondary | ICD-10-CM | POA: Diagnosis not present

## 2020-11-11 DIAGNOSIS — J449 Chronic obstructive pulmonary disease, unspecified: Secondary | ICD-10-CM | POA: Diagnosis not present

## 2020-11-11 DIAGNOSIS — N186 End stage renal disease: Secondary | ICD-10-CM | POA: Diagnosis not present

## 2020-11-11 DIAGNOSIS — I10 Essential (primary) hypertension: Secondary | ICD-10-CM | POA: Diagnosis not present

## 2020-11-12 DIAGNOSIS — Z992 Dependence on renal dialysis: Secondary | ICD-10-CM | POA: Diagnosis not present

## 2020-11-12 DIAGNOSIS — N186 End stage renal disease: Secondary | ICD-10-CM | POA: Diagnosis not present

## 2020-11-12 DIAGNOSIS — Z23 Encounter for immunization: Secondary | ICD-10-CM | POA: Diagnosis not present

## 2020-11-15 DIAGNOSIS — Z23 Encounter for immunization: Secondary | ICD-10-CM | POA: Diagnosis not present

## 2020-11-15 DIAGNOSIS — Z992 Dependence on renal dialysis: Secondary | ICD-10-CM | POA: Diagnosis not present

## 2020-11-15 DIAGNOSIS — N186 End stage renal disease: Secondary | ICD-10-CM | POA: Diagnosis not present

## 2020-11-17 DIAGNOSIS — Z23 Encounter for immunization: Secondary | ICD-10-CM | POA: Diagnosis not present

## 2020-11-17 DIAGNOSIS — Z992 Dependence on renal dialysis: Secondary | ICD-10-CM | POA: Diagnosis not present

## 2020-11-17 DIAGNOSIS — Z20822 Contact with and (suspected) exposure to covid-19: Secondary | ICD-10-CM | POA: Diagnosis not present

## 2020-11-17 DIAGNOSIS — N186 End stage renal disease: Secondary | ICD-10-CM | POA: Diagnosis not present

## 2020-11-19 DIAGNOSIS — N186 End stage renal disease: Secondary | ICD-10-CM | POA: Diagnosis not present

## 2020-11-19 DIAGNOSIS — Z23 Encounter for immunization: Secondary | ICD-10-CM | POA: Diagnosis not present

## 2020-11-19 DIAGNOSIS — Z992 Dependence on renal dialysis: Secondary | ICD-10-CM | POA: Diagnosis not present

## 2020-11-22 DIAGNOSIS — Z992 Dependence on renal dialysis: Secondary | ICD-10-CM | POA: Diagnosis not present

## 2020-11-22 DIAGNOSIS — N186 End stage renal disease: Secondary | ICD-10-CM | POA: Diagnosis not present

## 2020-11-24 DIAGNOSIS — N186 End stage renal disease: Secondary | ICD-10-CM | POA: Diagnosis not present

## 2020-11-24 DIAGNOSIS — Z1159 Encounter for screening for other viral diseases: Secondary | ICD-10-CM | POA: Diagnosis not present

## 2020-11-24 DIAGNOSIS — Z23 Encounter for immunization: Secondary | ICD-10-CM | POA: Diagnosis not present

## 2020-11-24 DIAGNOSIS — Z992 Dependence on renal dialysis: Secondary | ICD-10-CM | POA: Diagnosis not present

## 2020-11-26 DIAGNOSIS — Z23 Encounter for immunization: Secondary | ICD-10-CM | POA: Diagnosis not present

## 2020-11-26 DIAGNOSIS — N186 End stage renal disease: Secondary | ICD-10-CM | POA: Diagnosis not present

## 2020-11-26 DIAGNOSIS — Z992 Dependence on renal dialysis: Secondary | ICD-10-CM | POA: Diagnosis not present

## 2020-11-29 ENCOUNTER — Ambulatory Visit: Payer: Medicare HMO | Admitting: Cardiology

## 2020-11-29 DIAGNOSIS — N186 End stage renal disease: Secondary | ICD-10-CM | POA: Diagnosis not present

## 2020-11-29 DIAGNOSIS — Z992 Dependence on renal dialysis: Secondary | ICD-10-CM | POA: Diagnosis not present

## 2020-11-29 DIAGNOSIS — Z23 Encounter for immunization: Secondary | ICD-10-CM | POA: Diagnosis not present

## 2020-12-01 DIAGNOSIS — N186 End stage renal disease: Secondary | ICD-10-CM | POA: Diagnosis not present

## 2020-12-01 DIAGNOSIS — Z992 Dependence on renal dialysis: Secondary | ICD-10-CM | POA: Diagnosis not present

## 2020-12-01 DIAGNOSIS — Z23 Encounter for immunization: Secondary | ICD-10-CM | POA: Diagnosis not present

## 2020-12-02 DIAGNOSIS — N186 End stage renal disease: Secondary | ICD-10-CM | POA: Diagnosis not present

## 2020-12-02 DIAGNOSIS — Z992 Dependence on renal dialysis: Secondary | ICD-10-CM | POA: Diagnosis not present

## 2020-12-03 DIAGNOSIS — N186 End stage renal disease: Secondary | ICD-10-CM | POA: Diagnosis not present

## 2020-12-03 DIAGNOSIS — Z992 Dependence on renal dialysis: Secondary | ICD-10-CM | POA: Diagnosis not present

## 2020-12-06 DIAGNOSIS — Z992 Dependence on renal dialysis: Secondary | ICD-10-CM | POA: Diagnosis not present

## 2020-12-06 DIAGNOSIS — N186 End stage renal disease: Secondary | ICD-10-CM | POA: Diagnosis not present

## 2020-12-08 ENCOUNTER — Telehealth: Payer: Self-pay | Admitting: Nurse Practitioner

## 2020-12-08 DIAGNOSIS — Z992 Dependence on renal dialysis: Secondary | ICD-10-CM | POA: Diagnosis not present

## 2020-12-08 DIAGNOSIS — N186 End stage renal disease: Secondary | ICD-10-CM | POA: Diagnosis not present

## 2020-12-08 NOTE — Telephone Encounter (Signed)
Faxed lab order to [facility] in [place] per pt at fax # [phone]

## 2020-12-10 DIAGNOSIS — Z992 Dependence on renal dialysis: Secondary | ICD-10-CM | POA: Diagnosis not present

## 2020-12-10 DIAGNOSIS — N186 End stage renal disease: Secondary | ICD-10-CM | POA: Diagnosis not present

## 2020-12-13 DIAGNOSIS — N186 End stage renal disease: Secondary | ICD-10-CM | POA: Diagnosis not present

## 2020-12-13 DIAGNOSIS — D509 Iron deficiency anemia, unspecified: Secondary | ICD-10-CM | POA: Diagnosis not present

## 2020-12-13 DIAGNOSIS — N2581 Secondary hyperparathyroidism of renal origin: Secondary | ICD-10-CM | POA: Diagnosis not present

## 2020-12-13 DIAGNOSIS — Z992 Dependence on renal dialysis: Secondary | ICD-10-CM | POA: Diagnosis not present

## 2020-12-15 ENCOUNTER — Telehealth: Payer: Self-pay

## 2020-12-15 ENCOUNTER — Other Ambulatory Visit: Payer: Self-pay

## 2020-12-15 DIAGNOSIS — Z992 Dependence on renal dialysis: Secondary | ICD-10-CM | POA: Diagnosis not present

## 2020-12-15 DIAGNOSIS — N186 End stage renal disease: Secondary | ICD-10-CM | POA: Diagnosis not present

## 2020-12-15 NOTE — Telephone Encounter (Signed)
Ramiro Harvest, HD PA calls to report that patient has been having increasing numbness and intermittent cramping since her fistula revision on 10/05/2020. There is no swelling, redness, etc - and access is working well. The numbness/cramping is becoming increasingly bothersome for her. Placed patient on the schedule for a steal study and follow up.

## 2020-12-16 ENCOUNTER — Ambulatory Visit: Payer: Medicare HMO | Admitting: Vascular Surgery

## 2020-12-16 ENCOUNTER — Ambulatory Visit: Payer: Medicare HMO | Admitting: Nurse Practitioner

## 2020-12-16 ENCOUNTER — Other Ambulatory Visit (HOSPITAL_COMMUNITY): Payer: Medicare HMO

## 2020-12-17 DIAGNOSIS — N186 End stage renal disease: Secondary | ICD-10-CM | POA: Diagnosis not present

## 2020-12-17 DIAGNOSIS — Z992 Dependence on renal dialysis: Secondary | ICD-10-CM | POA: Diagnosis not present

## 2020-12-20 DIAGNOSIS — Z992 Dependence on renal dialysis: Secondary | ICD-10-CM | POA: Diagnosis not present

## 2020-12-20 DIAGNOSIS — N186 End stage renal disease: Secondary | ICD-10-CM | POA: Diagnosis not present

## 2020-12-22 DIAGNOSIS — N186 End stage renal disease: Secondary | ICD-10-CM | POA: Diagnosis not present

## 2020-12-22 DIAGNOSIS — Z992 Dependence on renal dialysis: Secondary | ICD-10-CM | POA: Diagnosis not present

## 2020-12-24 DIAGNOSIS — N186 End stage renal disease: Secondary | ICD-10-CM | POA: Diagnosis not present

## 2020-12-24 DIAGNOSIS — Z992 Dependence on renal dialysis: Secondary | ICD-10-CM | POA: Diagnosis not present

## 2020-12-27 DIAGNOSIS — Z794 Long term (current) use of insulin: Secondary | ICD-10-CM | POA: Diagnosis not present

## 2020-12-27 DIAGNOSIS — Z992 Dependence on renal dialysis: Secondary | ICD-10-CM | POA: Diagnosis not present

## 2020-12-27 DIAGNOSIS — N2581 Secondary hyperparathyroidism of renal origin: Secondary | ICD-10-CM | POA: Diagnosis not present

## 2020-12-27 DIAGNOSIS — N186 End stage renal disease: Secondary | ICD-10-CM | POA: Diagnosis not present

## 2020-12-27 DIAGNOSIS — E119 Type 2 diabetes mellitus without complications: Secondary | ICD-10-CM | POA: Diagnosis not present

## 2020-12-29 DIAGNOSIS — N186 End stage renal disease: Secondary | ICD-10-CM | POA: Diagnosis not present

## 2020-12-29 DIAGNOSIS — Z992 Dependence on renal dialysis: Secondary | ICD-10-CM | POA: Diagnosis not present

## 2020-12-30 ENCOUNTER — Ambulatory Visit (HOSPITAL_COMMUNITY)
Admission: RE | Admit: 2020-12-30 | Discharge: 2020-12-30 | Disposition: A | Discharge status: Home / Self Care | Payer: Medicare HMO | Source: Ambulatory Visit | Attending: Vascular Surgery | Admitting: Vascular Surgery

## 2020-12-30 ENCOUNTER — Encounter: Payer: Self-pay | Admitting: Vascular Surgery

## 2020-12-30 ENCOUNTER — Other Ambulatory Visit: Payer: Self-pay

## 2020-12-30 ENCOUNTER — Ambulatory Visit (INDEPENDENT_AMBULATORY_CARE_PROVIDER_SITE_OTHER): Payer: Self-pay | Admitting: Vascular Surgery

## 2020-12-30 VITALS — BP 120/77 | HR 65 | Temp 97.9°F | Resp 20 | Ht 64.0 in | Wt 264.0 lb

## 2020-12-30 DIAGNOSIS — N186 End stage renal disease: Secondary | ICD-10-CM | POA: Diagnosis not present

## 2020-12-30 DIAGNOSIS — Z992 Dependence on renal dialysis: Secondary | ICD-10-CM

## 2020-12-30 NOTE — Progress Notes (Signed)
Patient is a 50 year old female who returns for follow-up today.  She previously had first and second stage basilic vein transposition in her left arm.  These were done by Dr. Donzetta Matters.  I recently revised her AV fistula and did a patch angioplasty of the axillary vein portion of the fistula.  Since that time she has had increasing numbness tingling and weakness in the left hand.  She also has some areas of numbness and tingling over the left upper arm and left dorsal forearm.  She states her fistula is functioning okay on dialysis.  Her dialysis day is Monday Wednesday Friday.  She is left-hand dominant and states that she has basically had to switch over to using her right hand due to the weakness on the left side.  Physical exam:  Vitals:   12/30/20 0827  BP: 120/77  Pulse: 65  Resp: 20  Temp: 97.9 F (36.6 C)  SpO2: 94%  Weight: 264 lb (119.7 kg)  Height: '5\' 4"'$  (1.626 m)    Palpable thrill left upper arm AV fistula.  2+ left radial pulse.  Data: Patient had a duplex ultrasound today which showed a 16 mm gradient increase in pressure to the left arm with compression of her fistula.  This is suggestive of steal.  I reviewed this study and interpreted it.  Assessment: Moderate to severe steal symptoms left upper extremity.  I discussed with the patient today the possibility of ligating her AV fistula to improve symptoms in her left hand.  She is going to think about whether or not she wishes to have this done and lose the access in her left side.  She has had a previous failed access in the right arm.  This was a right brachiocephalic AV fistula.  Plan: Patient will call me in the future if she wishes to have ligation of her left arm AV fistula.  Ruta Hinds, MD [facility] [place] Office: [phone]

## 2020-12-30 NOTE — H&P (View-Only) (Signed)
Patient is a 50 year old female who returns for follow-up today.  She previously had first and second stage basilic vein transposition in her left arm.  These were done by Dr. Donzetta Matters.  I recently revised her AV fistula and did a patch angioplasty of the axillary vein portion of the fistula.  Since that time she has had increasing numbness tingling and weakness in the left hand.  She also has some areas of numbness and tingling over the left upper arm and left dorsal forearm.  She states her fistula is functioning okay on dialysis.  Her dialysis day is Monday Wednesday Friday.  She is left-hand dominant and states that she has basically had to switch over to using her right hand due to the weakness on the left side.  Physical exam:  Vitals:   12/30/20 0827  BP: 120/77  Pulse: 65  Resp: 20  Temp: 97.9 F (36.6 C)  SpO2: 94%  Weight: 264 lb (119.7 kg)  Height: '5\' 4"'$  (1.626 m)    Palpable thrill left upper arm AV fistula.  2+ left radial pulse.  Data: Patient had a duplex ultrasound today which showed a 16 mm gradient increase in pressure to the left arm with compression of her fistula.  This is suggestive of steal.  I reviewed this study and interpreted it.  Assessment: Moderate to severe steal symptoms left upper extremity.  I discussed with the patient today the possibility of ligating her AV fistula to improve symptoms in her left hand.  She is going to think about whether or not she wishes to have this done and lose the access in her left side.  She has had a previous failed access in the right arm.  This was a right brachiocephalic AV fistula.  Plan: Patient will call me in the future if she wishes to have ligation of her left arm AV fistula.  Ruta Hinds, MD [facility] [place] Office: [phone]

## 2020-12-31 DIAGNOSIS — Z992 Dependence on renal dialysis: Secondary | ICD-10-CM | POA: Diagnosis not present

## 2020-12-31 DIAGNOSIS — N186 End stage renal disease: Secondary | ICD-10-CM | POA: Diagnosis not present

## 2021-01-02 DIAGNOSIS — N186 End stage renal disease: Secondary | ICD-10-CM | POA: Diagnosis not present

## 2021-01-02 DIAGNOSIS — Z992 Dependence on renal dialysis: Secondary | ICD-10-CM | POA: Diagnosis not present

## 2021-01-03 ENCOUNTER — Other Ambulatory Visit: Payer: Self-pay

## 2021-01-03 ENCOUNTER — Telehealth: Payer: Self-pay

## 2021-01-03 ENCOUNTER — Encounter (HOSPITAL_COMMUNITY): Payer: Self-pay | Admitting: Vascular Surgery

## 2021-01-03 DIAGNOSIS — Z992 Dependence on renal dialysis: Secondary | ICD-10-CM | POA: Diagnosis not present

## 2021-01-03 DIAGNOSIS — N186 End stage renal disease: Secondary | ICD-10-CM | POA: Diagnosis not present

## 2021-01-03 NOTE — Telephone Encounter (Signed)
Pt called ready to schedule ligation of left arm AVF with Dr Oneida Alar- pt scheduled for 01/04/21. Voiced understanding

## 2021-01-03 NOTE — Progress Notes (Signed)
Spoke with pt for pre-op call. Pt has hx of HTN, mild CAD and Type 2 Diabetes. Pt is also on dialysis on M/W/F. Pt states her last A1C was last week at dialysis and it was 11.2. Pt states she has not checked her blood sugar in several weeks. Instructed her to take 1/2 of her regular dose of Levemir Insulin tonight - she will take 40 units. Instructed her to check her blood sugar in the AM when she wakes up and every 2 hours until she leaves for the hospital. If blood sugar is >220 take 1/2 of usual correction dose of Novolog insulin. If blood sugar is 70 or below, treat with 1/2 cup of clear juice (apple or cranberry) and recheck blood sugar 15 minutes after drinking juice. Pt interrupted me at this point and said it was too much info and she knows what to do.   Pt states she normally gets admitted after her surgeries and does not have anyone to stay with her or pick her up. Pt is scheduled as ambulatory. She states she will talk her surgeon in the AM when she arrives here.

## 2021-01-04 ENCOUNTER — Ambulatory Visit (HOSPITAL_COMMUNITY): Payer: Medicare HMO

## 2021-01-04 ENCOUNTER — Ambulatory Visit (HOSPITAL_COMMUNITY): Payer: Medicare HMO | Admitting: Certified Registered Nurse Anesthetist

## 2021-01-04 ENCOUNTER — Ambulatory Visit (HOSPITAL_COMMUNITY)
Admission: RE | Admit: 2021-01-04 | Discharge: 2021-01-04 | Disposition: A | Discharge status: Home / Self Care | Payer: Medicare HMO | Attending: Vascular Surgery | Admitting: Vascular Surgery

## 2021-01-04 ENCOUNTER — Other Ambulatory Visit: Payer: Self-pay

## 2021-01-04 ENCOUNTER — Encounter (HOSPITAL_COMMUNITY): Payer: Self-pay | Admitting: Vascular Surgery

## 2021-01-04 ENCOUNTER — Encounter (HOSPITAL_COMMUNITY)
Admission: RE | Disposition: A | Discharge status: Home / Self Care | Payer: Self-pay | Source: Home / Self Care | Attending: Vascular Surgery

## 2021-01-04 DIAGNOSIS — N186 End stage renal disease: Secondary | ICD-10-CM | POA: Diagnosis not present

## 2021-01-04 DIAGNOSIS — T82898A Other specified complication of vascular prosthetic devices, implants and grafts, initial encounter: Secondary | ICD-10-CM | POA: Insufficient documentation

## 2021-01-04 DIAGNOSIS — Y832 Surgical operation with anastomosis, bypass or graft as the cause of abnormal reaction of the patient, or of later complication, without mention of misadventure at the time of the procedure: Secondary | ICD-10-CM | POA: Diagnosis not present

## 2021-01-04 DIAGNOSIS — Z992 Dependence on renal dialysis: Secondary | ICD-10-CM | POA: Diagnosis not present

## 2021-01-04 DIAGNOSIS — Z95828 Presence of other vascular implants and grafts: Secondary | ICD-10-CM

## 2021-01-04 DIAGNOSIS — Z87891 Personal history of nicotine dependence: Secondary | ICD-10-CM | POA: Insufficient documentation

## 2021-01-04 DIAGNOSIS — E1122 Type 2 diabetes mellitus with diabetic chronic kidney disease: Secondary | ICD-10-CM | POA: Diagnosis not present

## 2021-01-04 DIAGNOSIS — Z794 Long term (current) use of insulin: Secondary | ICD-10-CM | POA: Diagnosis not present

## 2021-01-04 DIAGNOSIS — I10 Essential (primary) hypertension: Secondary | ICD-10-CM | POA: Diagnosis not present

## 2021-01-04 DIAGNOSIS — I120 Hypertensive chronic kidney disease with stage 5 chronic kidney disease or end stage renal disease: Secondary | ICD-10-CM | POA: Diagnosis not present

## 2021-01-04 DIAGNOSIS — Z419 Encounter for procedure for purposes other than remedying health state, unspecified: Secondary | ICD-10-CM

## 2021-01-04 DIAGNOSIS — T82590A Other mechanical complication of surgically created arteriovenous fistula, initial encounter: Secondary | ICD-10-CM | POA: Diagnosis not present

## 2021-01-04 DIAGNOSIS — N185 Chronic kidney disease, stage 5: Secondary | ICD-10-CM | POA: Diagnosis not present

## 2021-01-04 DIAGNOSIS — I517 Cardiomegaly: Secondary | ICD-10-CM | POA: Diagnosis not present

## 2021-01-04 HISTORY — PX: INSERTION OF DIALYSIS CATHETER: SHX1324

## 2021-01-04 HISTORY — PX: LIGATION OF ARTERIOVENOUS  FISTULA: SHX5948

## 2021-01-04 LAB — POCT I-STAT, CHEM 8
BUN: 34 mg/dL — ABNORMAL HIGH (ref 6–20)
Calcium, Ion: 0.77 mmol/L — CL (ref 1.15–1.40)
Chloride: 105 mmol/L (ref 98–111)
Creatinine, Ser: 5.3 mg/dL — ABNORMAL HIGH (ref 0.44–1.00)
Glucose, Bld: 193 mg/dL — ABNORMAL HIGH (ref 70–99)
HCT: 37 % (ref 36.0–46.0)
Hemoglobin: 12.6 g/dL (ref 12.0–15.0)
Potassium: 4.4 mmol/L (ref 3.5–5.1)
Sodium: 132 mmol/L — ABNORMAL LOW (ref 135–145)
TCO2: 23 mmol/L (ref 22–32)

## 2021-01-04 LAB — GLUCOSE, CAPILLARY
Glucose-Capillary: 203 mg/dL — ABNORMAL HIGH (ref 70–99)
Glucose-Capillary: 178 mg/dL — ABNORMAL HIGH (ref 70–99)
Glucose-Capillary: 172 mg/dL — ABNORMAL HIGH (ref 70–99)

## 2021-01-04 LAB — POCT PREGNANCY, URINE: Preg Test, Ur: NEGATIVE

## 2021-01-04 SURGERY — LIGATION OF ARTERIOVENOUS  FISTULA
Anesthesia: General | Site: Neck | Laterality: Right

## 2021-01-04 MED ORDER — DEXAMETHASONE SODIUM PHOSPHATE 10 MG/ML IJ SOLN
INTRAMUSCULAR | Status: DC | PRN
  Administered 2021-01-04: 5 mg via INTRAVENOUS

## 2021-01-04 MED ORDER — OXYCODONE-ACETAMINOPHEN 10-325 MG PO TABS: 1.0000 | ORAL_TABLET | Freq: Four times a day (QID) | ORAL | 0 refills | Status: DC | PRN

## 2021-01-04 MED ORDER — HEPARIN 6000 UNIT IRRIGATION SOLUTION
Status: AC
  Filled 2021-01-04: qty 500

## 2021-01-04 MED ORDER — PROTAMINE SULFATE 10 MG/ML IV SOLN
INTRAVENOUS | Status: AC
  Filled 2021-01-04: qty 10

## 2021-01-04 MED ORDER — HEPARIN 6000 UNIT IRRIGATION SOLUTION
Status: DC | PRN
  Administered 2021-01-04: 1

## 2021-01-04 MED ORDER — HEPARIN SODIUM (PORCINE) 1000 UNIT/ML IJ SOLN
INTRAMUSCULAR | Status: DC | PRN
  Administered 2021-01-04: 3800 [IU]

## 2021-01-04 MED ORDER — MIDAZOLAM HCL 2 MG/2ML IJ SOLN
INTRAMUSCULAR | Status: AC
  Administered 2021-01-04: 1 mg via INTRAVENOUS
  Filled 2021-01-04: qty 2

## 2021-01-04 MED ORDER — VANCOMYCIN HCL IN DEXTROSE 1-5 GM/200ML-% IV SOLN: 1000.0000 mg | INTRAVENOUS | Status: AC

## 2021-01-04 MED ORDER — FENTANYL CITRATE (PF) 100 MCG/2ML IJ SOLN: 50.0000 ug | Freq: Once | INTRAMUSCULAR | Status: AC

## 2021-01-04 MED ORDER — LIDOCAINE HCL (PF) 1 % IJ SOLN
INTRAMUSCULAR | Status: AC
  Filled 2021-01-04: qty 30

## 2021-01-04 MED ORDER — ORAL CARE MOUTH RINSE: 15.0000 mL | Freq: Once | OROMUCOSAL | Status: AC

## 2021-01-04 MED ORDER — VANCOMYCIN HCL IN DEXTROSE 1-5 GM/200ML-% IV SOLN
INTRAVENOUS | Status: AC
  Administered 2021-01-04: 1000 mg via INTRAVENOUS
  Filled 2021-01-04: qty 200

## 2021-01-04 MED ORDER — PHENYLEPHRINE HCL-NACL 20-0.9 MG/250ML-% IV SOLN
INTRAVENOUS | Status: DC | PRN
  Administered 2021-01-04: 25 ug/min via INTRAVENOUS

## 2021-01-04 MED ORDER — LIDOCAINE 2% (20 MG/ML) 5 ML SYRINGE
INTRAMUSCULAR | Status: DC | PRN
  Administered 2021-01-04: 40 mg via INTRAVENOUS

## 2021-01-04 MED ORDER — PROPOFOL 1000 MG/100ML IV EMUL
INTRAVENOUS | Status: AC
  Filled 2021-01-04: qty 200

## 2021-01-04 MED ORDER — MIDAZOLAM HCL 2 MG/2ML IJ SOLN
INTRAMUSCULAR | Status: AC
  Filled 2021-01-04: qty 2

## 2021-01-04 MED ORDER — FENTANYL CITRATE (PF) 250 MCG/5ML IJ SOLN
INTRAMUSCULAR | Status: DC | PRN
  Administered 2021-01-04: 25 ug via INTRAVENOUS
  Administered 2021-01-04: 50 ug via INTRAVENOUS
  Administered 2021-01-04 (×3): 25 ug via INTRAVENOUS

## 2021-01-04 MED ORDER — CHLORHEXIDINE GLUCONATE 4 % EX LIQD: 60.0000 mL | Freq: Once | CUTANEOUS | Status: DC

## 2021-01-04 MED ORDER — LACTATED RINGERS IV SOLN: INTRAVENOUS | Status: DC | PRN

## 2021-01-04 MED ORDER — OXYCODONE HCL 5 MG PO TABS
5.0000 mg | ORAL_TABLET | Freq: Once | ORAL | Status: AC | PRN
  Administered 2021-01-04: 5 mg via ORAL

## 2021-01-04 MED ORDER — OXYCODONE HCL 5 MG/5ML PO SOLN: 5.0000 mg | Freq: Once | ORAL | Status: AC | PRN

## 2021-01-04 MED ORDER — FENTANYL CITRATE (PF) 100 MCG/2ML IJ SOLN: 25.0000 ug | INTRAMUSCULAR | Status: DC | PRN

## 2021-01-04 MED ORDER — 0.9 % SODIUM CHLORIDE (POUR BTL) OPTIME
TOPICAL | Status: DC | PRN
  Administered 2021-01-04: 1000 mL

## 2021-01-04 MED ORDER — ONDANSETRON HCL 4 MG/2ML IJ SOLN
4.0000 mg | Freq: Four times a day (QID) | INTRAMUSCULAR | Status: DC | PRN
Start: 2021-01-04 — End: 2021-01-04

## 2021-01-04 MED ORDER — PROPOFOL 10 MG/ML IV BOLUS
INTRAVENOUS | Status: DC | PRN
  Administered 2021-01-04: 200 mg via INTRAVENOUS

## 2021-01-04 MED ORDER — SODIUM CHLORIDE 0.9 % IV SOLN: INTRAVENOUS | Status: DC

## 2021-01-04 MED ORDER — ALBUMIN HUMAN 5 % IV SOLN: INTRAVENOUS | Status: DC | PRN

## 2021-01-04 MED ORDER — PHENYLEPHRINE HCL-NACL 20-0.9 MG/250ML-% IV SOLN
INTRAVENOUS | Status: AC
  Filled 2021-01-04: qty 250

## 2021-01-04 MED ORDER — CHLORHEXIDINE GLUCONATE 0.12 % MT SOLN: 15.0000 mL | Freq: Once | OROMUCOSAL | Status: AC

## 2021-01-04 MED ORDER — FENTANYL CITRATE (PF) 250 MCG/5ML IJ SOLN
INTRAMUSCULAR | Status: AC
  Filled 2021-01-04: qty 5

## 2021-01-04 MED ORDER — OXYCODONE HCL 5 MG PO TABS
ORAL_TABLET | ORAL | Status: AC
  Filled 2021-01-04: qty 1

## 2021-01-04 MED ORDER — HEPARIN SODIUM (PORCINE) 1000 UNIT/ML IJ SOLN
INTRAMUSCULAR | Status: AC
  Filled 2021-01-04: qty 1

## 2021-01-04 MED ORDER — MIDAZOLAM HCL 2 MG/2ML IJ SOLN: 1.0000 mg | Freq: Once | INTRAMUSCULAR | Status: AC

## 2021-01-04 MED ORDER — PROPOFOL 10 MG/ML IV BOLUS
INTRAVENOUS | Status: AC
  Filled 2021-01-04: qty 20

## 2021-01-04 MED ORDER — EPHEDRINE SULFATE 50 MG/ML IJ SOLN
INTRAMUSCULAR | Status: DC | PRN
  Administered 2021-01-04: 10 mg via INTRAVENOUS
  Administered 2021-01-04 (×2): 5 mg via INTRAVENOUS

## 2021-01-04 MED ORDER — FENTANYL CITRATE (PF) 100 MCG/2ML IJ SOLN
INTRAMUSCULAR | Status: AC
  Administered 2021-01-04: 50 ug via INTRAVENOUS
  Filled 2021-01-04: qty 2

## 2021-01-04 MED ORDER — ONDANSETRON HCL 4 MG/2ML IJ SOLN
INTRAMUSCULAR | Status: DC | PRN
  Administered 2021-01-04: 4 mg via INTRAVENOUS

## 2021-01-04 MED ORDER — CHLORHEXIDINE GLUCONATE 0.12 % MT SOLN
OROMUCOSAL | Status: AC
  Administered 2021-01-04: 15 mL via OROMUCOSAL
  Filled 2021-01-04: qty 15

## 2021-01-04 SURGICAL SUPPLY — 35 items
BAG COUNTER SPONGE SURGICOUNT (BAG) ×3 IMPLANT
BIOPATCH RED 1 DISK 7.0 (GAUZE/BANDAGES/DRESSINGS) ×3 IMPLANT
CANISTER SUCT 3000ML PPV (MISCELLANEOUS) ×3 IMPLANT
CATH PALINDROME-P 23CM W/VT (CATHETERS) ×3 IMPLANT
CATH STRAIGHT 5FR 65CM (CATHETERS) ×3 IMPLANT
DERMABOND ADVANCED (GAUZE/BANDAGES/DRESSINGS) ×2
DERMABOND ADVANCED .7 DNX12 (GAUZE/BANDAGES/DRESSINGS) ×4 IMPLANT
DRAPE C-ARM 42X72 X-RAY (DRAPES) ×3 IMPLANT
ELECT REM PT RETURN 9FT ADLT (ELECTROSURGICAL) ×3
ELECTRODE REM PT RTRN 9FT ADLT (ELECTROSURGICAL) ×2 IMPLANT
GAUZE 4X4 16PLY ~~LOC~~+RFID DBL (SPONGE) ×3 IMPLANT
GLOVE SURG ENC MOIS LTX SZ7.5 (GLOVE) ×3 IMPLANT
GLOVE SURG MICRO LTX SZ6.5 (GLOVE) ×3 IMPLANT
GOWN STRL REUS W/ TWL LRG LVL3 (GOWN DISPOSABLE) ×6 IMPLANT
GOWN STRL REUS W/TWL LRG LVL3 (GOWN DISPOSABLE) ×3
HEMOSTAT SPONGE AVITENE ULTRA (HEMOSTASIS) IMPLANT
KIT BASIN OR (CUSTOM PROCEDURE TRAY) ×3 IMPLANT
KIT TURNOVER KIT B (KITS) ×3 IMPLANT
LOOP VESSEL MINI RED (MISCELLANEOUS) IMPLANT
NEEDLE 18GX1X1/2 (RX/OR ONLY) (NEEDLE) ×3 IMPLANT
NS IRRIG 1000ML POUR BTL (IV SOLUTION) ×3 IMPLANT
PACK CV ACCESS (CUSTOM PROCEDURE TRAY) ×3 IMPLANT
PAD ARMBOARD 7.5X6 YLW CONV (MISCELLANEOUS) ×6 IMPLANT
SPONGE T-LAP 18X18 ~~LOC~~+RFID (SPONGE) ×3 IMPLANT
SUT ETHILON 3 0 PS 1 (SUTURE) ×3 IMPLANT
SUT PROLENE 6 0 CC (SUTURE) ×3 IMPLANT
SUT VIC AB 3-0 SH 27 (SUTURE) ×1
SUT VIC AB 3-0 SH 27X BRD (SUTURE) ×2 IMPLANT
SUT VIC AB 4-0 PS2 27 (SUTURE) ×3 IMPLANT
SYR 20CC LL (SYRINGE) ×3 IMPLANT
SYR 5ML LL (SYRINGE) ×3 IMPLANT
TOWEL GREEN STERILE (TOWEL DISPOSABLE) ×3 IMPLANT
UNDERPAD 30X36 HEAVY ABSORB (UNDERPADS AND DIAPERS) ×3 IMPLANT
WATER STERILE IRR 1000ML POUR (IV SOLUTION) ×3 IMPLANT
WIRE AMPLATZ SS-J .035X180CM (WIRE) ×3 IMPLANT

## 2021-01-04 NOTE — Anesthesia Preprocedure Evaluation (Addendum)
Anesthesia Evaluation  Patient identified by MRN, date of birth, ID band Patient awake    Reviewed: Allergy & Precautions, H&P , NPO status , Patient's Chart, lab work & pertinent test results  Airway Mallampati: II   Neck ROM: full    Dental   Pulmonary shortness of breath, COPD, former smoker,    breath sounds clear to auscultation       Cardiovascular hypertension, + CAD   Rhythm:regular Rate:Normal     Neuro/Psych PSYCHIATRIC DISORDERS Anxiety Depression Bipolar Disorder TIACVA    GI/Hepatic GERD  ,  Endo/Other  diabetes, Type 2  Renal/GU ESRF and DialysisRenal disease     Musculoskeletal   Abdominal   Peds  Hematology   Anesthesia Other Findings   Reproductive/Obstetrics                             Anesthesia Physical Anesthesia Plan  ASA: 3  Anesthesia Plan: General   Post-op Pain Management:    Induction: Intravenous  PONV Risk Score and Plan: 3 and Treatment may vary due to age or medical condition, Ondansetron and Dexamethasone  Airway Management Planned: LMA  Additional Equipment:   Intra-op Plan:   Post-operative Plan: Extubation in OR  Informed Consent: I have reviewed the patients History and Physical, chart, labs and discussed the procedure including the risks, benefits and alternatives for the proposed anesthesia with the patient or authorized representative who has indicated his/her understanding and acceptance.     Dental advisory given  Plan Discussed with: CRNA, Anesthesiologist and Surgeon  Anesthesia Plan Comments:        Anesthesia Quick Evaluation

## 2021-01-04 NOTE — Anesthesia Procedure Notes (Signed)
Procedure Name: LMA Insertion Date/Time: 01/04/2021 2:51 PM Performed by: Clearnce Sorrel, CRNA Pre-anesthesia Checklist: Patient identified, Emergency Drugs available, Suction available and Patient being monitored Patient Re-evaluated:Patient Re-evaluated prior to induction Oxygen Delivery Method: Circle System Utilized Preoxygenation: Pre-oxygenation with 100% oxygen Induction Type: IV induction Ventilation: Mask ventilation without difficulty LMA: LMA inserted LMA Size: 4.0 Number of attempts: 1 Airway Equipment and Method: Bite block Placement Confirmation: positive ETCO2 Tube secured with: Tape Dental Injury: Teeth and Oropharynx as per pre-operative assessment  Comments: Placed by Rexford Maus, SRNA

## 2021-01-04 NOTE — Op Note (Signed)
Procedure: 1.  Ultrasound right neck insertion of 23 cm palindrome catheter right internal jugular vein  2.  Ligation left arm AV fistula  Preoperative diagnosis: Ischemic steal left hand  End-stage renal disease  Postoperative diagnosis: Same  Anesthesia: General  Assistant: Nurse  Operative findings: 23 cm palindrome right internal jugular vein   Operative details: After obtaining form consent, the patient was taken the operating.  The patient was placed in supine position operating table.  After induction general anesthesia and placement of a laryngeal mask patient's entire right neck and chest were prepped and draped usual sterile fashion.  Ultrasound was used to identify the right internal jugular vein which had normal compressibility and respiratory variation.  Introducer needle was placed into the right internal jugular vein and an 035 J-tip guidewire threaded into the right atrium under fluoroscopic guidance.  Next sequential 05/19/2015 [place] dilators with peel-away sheath placed over the guidewire in the right atrium.  An incision was made just below the right clavicle and a subcutaneous tunnel created and a 23 cm palindrome catheter brought through the subcutaneous tunnel.  This was then placed to the peel-away sheath into the right atrium.  Peel-away sheath was then removed.  This was inspected under fluoroscopy and the catheter was in to deep down into the inferior vena cava.  Therefore I created an additional tunnel about 4 cm more lateral to the first tunnel..  An 035 Amplatz wire was placed through the pre-existing catheter and the new tract was dilated up and a new tunnel made and a 23 cm palindrome catheter then advanced over the Amplatz wire into the right atrium.  Guidewire was then removed.  Catheter was inspected under fluoroscopy.  Tip was in good position.  There were no kinks throughout the course the catheter.  The catheter was sutured the skin with nylon sutures.  The neck  insertion site and the previous tunnel site were both closed with a Vicryl stitch.  Catheter was loaded concentrated heparin solution.  Next the patient's left upper extremities prepped and draped in usual sterile fashion.  An oblique incision was made near the antecubital crease through pre-existing scar.  Incision was carried down through subcutaneous tissues down to the level of a pre-existing fistula.  This was dissected free circumferentially down to the level of anastomosis and then doubly ligated with a 2-0 silk tie.  Doppler was used to inspect the fistula there was no flow or other flow within this.  The patient had a good palpable radial pulse and good Doppler flow in the brachial artery distal to the area of ligation.  The subcutaneous tissues were reapproximated using running 3-0 Vicryl suture.  The skin was closed with 4-0 Vicryl subcuticular stitch.  Dermabond was applied.  The patient tolerated this portion of the procedure well and there were no complications.  The incident sponge needle count was correct end of the case.  The patient was taken the recovery room in stable condition.  Ruta Hinds, MD [facility] Office: [phone]

## 2021-01-04 NOTE — Interval H&P Note (Signed)
History and Physical Interval Note:  01/04/2021 2:21 PM  Samantha Pierce  has presented today for surgery, with the diagnosis of ESRD.  The various methods of treatment have been discussed with the patient and family. After consideration of risks, benefits and other options for treatment, the patient has consented to  Procedure(s): LIGATION OF LEFT ARM ARTERIOVENOUS  FISTULA (Left) as a surgical intervention.  The patient's history has been reviewed, patient examined, no change in status, stable for surgery.  I have reviewed the patient's chart and labs.  Questions were answered to the patient's satisfaction.     Ruta Hinds

## 2021-01-04 NOTE — Transfer of Care (Signed)
Immediate Anesthesia Transfer of Care Note  Patient: Samantha Pierce  Procedure(s) Performed: LIGATION OF LEFT ARM ARTERIOVENOUS  FISTULA (Left: Arm Upper) INSERTION OF RIGHT INTERNAL JUGULAR TUNNELED DIALYSIS CATHETER (Right: Neck)  Patient Location: PACU  Anesthesia Type:General  Level of Consciousness: awake, alert  and oriented  Airway & Oxygen Therapy: Patient Spontanous Breathing  Post-op Assessment: Report given to RN and Post -op Vital signs reviewed and stable  Post vital signs: Reviewed and stable  Last Vitals:  Vitals Value Taken Time  BP 171/83 01/04/21 1705  Temp    Pulse 73 01/04/21 1706  Resp 25 01/04/21 1706  SpO2 100 % 01/04/21 1706  Vitals shown include unvalidated device data.  Last Pain:  Vitals:   01/04/21 1101  TempSrc:   PainSc: 0-No pain      Patients Stated Pain Goal: 6 ([id number])  Complications: No notable events documented.

## 2021-01-04 NOTE — Discharge Instructions (Signed)
No shower,no bath. You have a dialysis catheter placed.

## 2021-01-04 NOTE — Progress Notes (Signed)
Dr. Marin Comment notified of I-stat results, Ca 0.77. no new orders.

## 2021-01-05 ENCOUNTER — Encounter (HOSPITAL_COMMUNITY): Payer: Self-pay | Admitting: Vascular Surgery

## 2021-01-05 DIAGNOSIS — N186 End stage renal disease: Secondary | ICD-10-CM | POA: Diagnosis not present

## 2021-01-05 DIAGNOSIS — Z992 Dependence on renal dialysis: Secondary | ICD-10-CM | POA: Diagnosis not present

## 2021-01-05 NOTE — Anesthesia Postprocedure Evaluation (Signed)
Anesthesia Post Note  Patient: Samantha Pierce  Procedure(s) Performed: LIGATION OF LEFT ARM ARTERIOVENOUS  FISTULA (Left: Arm Upper) INSERTION OF RIGHT INTERNAL JUGULAR TUNNELED DIALYSIS CATHETER (Right: Neck)     Patient location during evaluation: PACU Anesthesia Type: General Level of consciousness: awake and alert Pain management: pain level controlled Vital Signs Assessment: post-procedure vital signs reviewed and stable Respiratory status: spontaneous breathing, nonlabored ventilation and respiratory function stable Cardiovascular status: blood pressure returned to baseline and stable Postop Assessment: no apparent nausea or vomiting Anesthetic complications: no   No notable events documented.  Last Vitals:  Vitals:   01/04/21 1735 01/04/21 1741  BP: (!) 169/77 (!) 155/80  Pulse: 79 77  Resp: (!) 23 14  Temp:  (!) 36.2 C  SpO2: 100% 100%    Last Pain:  Vitals:   01/04/21 1705  TempSrc:   PainSc: 0-No pain                 Lynda Rainwater

## 2021-01-05 NOTE — Anesthesia Postprocedure Evaluation (Deleted)
Anesthesia Post Note  Patient: Samantha Pierce  Procedure(s) Performed: LIGATION OF LEFT ARM ARTERIOVENOUS  FISTULA (Left: Arm Upper) INSERTION OF RIGHT INTERNAL JUGULAR TUNNELED DIALYSIS CATHETER (Right: Neck)     Patient location during evaluation: PACU Anesthesia Type: General Level of consciousness: awake and alert Pain management: pain level controlled Vital Signs Assessment: post-procedure vital signs reviewed and stable Respiratory status: spontaneous breathing, nonlabored ventilation and respiratory function stable Cardiovascular status: blood pressure returned to baseline and stable Postop Assessment: no apparent nausea or vomiting Anesthetic complications: no   No notable events documented.  Last Vitals:  Vitals:   01/04/21 1735 01/04/21 1741  BP: (!) 169/77 (!) 155/80  Pulse: 79 77  Resp: (!) 23 14  Temp:  (!) 36.2 C  SpO2: 100% 100%    Last Pain:  Vitals:   01/04/21 1705  TempSrc:   PainSc: 0-No pain                 Lynda Rainwater

## 2021-01-06 ENCOUNTER — Other Ambulatory Visit: Payer: Self-pay

## 2021-01-06 ENCOUNTER — Ambulatory Visit (INDEPENDENT_AMBULATORY_CARE_PROVIDER_SITE_OTHER): Payer: Medicare HMO | Admitting: Student

## 2021-01-06 ENCOUNTER — Encounter: Payer: Self-pay | Admitting: Student

## 2021-01-06 VITALS — BP 154/60 | HR 62 | Ht 64.0 in | Wt 263.8 lb

## 2021-01-06 DIAGNOSIS — I2510 Atherosclerotic heart disease of native coronary artery without angina pectoris: Secondary | ICD-10-CM

## 2021-01-06 DIAGNOSIS — I10 Essential (primary) hypertension: Secondary | ICD-10-CM

## 2021-01-06 DIAGNOSIS — N186 End stage renal disease: Secondary | ICD-10-CM

## 2021-01-06 DIAGNOSIS — E785 Hyperlipidemia, unspecified: Secondary | ICD-10-CM | POA: Diagnosis not present

## 2021-01-06 DIAGNOSIS — Z992 Dependence on renal dialysis: Secondary | ICD-10-CM

## 2021-01-06 DIAGNOSIS — Z8673 Personal history of transient ischemic attack (TIA), and cerebral infarction without residual deficits: Secondary | ICD-10-CM | POA: Diagnosis not present

## 2021-01-06 NOTE — Patient Instructions (Signed)
Medication Instructions:  Your physician recommends that you continue on your current medications as directed. Please refer to the Current Medication list given to you today.  Restart Aspirin 81 mg Daily if ok with vascular on 01/20/21.   *If you need a refill on your cardiac medications before your next appointment, please call your pharmacy*   Lab Work: Your physician recommends that you return for lab work in: F[place]   If you have labs (blood work) drawn today and your tests are completely normal, you will receive your results only by: [place] (if you have [place]) OR A paper copy in the mail If you have any lab test that is abnormal or we need to change your treatment, we will call you to review the results.   Testing/Procedures: NONE    Follow-Up: At [facility], you and your health needs are our priority.  As part of our continuing mission to provide you with exceptional heart care, we have created designated Provider Care Teams.  These Care Teams include your primary Cardiologist (physician) and Advanced Practice Providers (APPs -  Physician Assistants and Nurse Practitioners) who all work together to provide you with the care you need, when you need it.  We recommend signing up for the patient portal called "MyChart".  Sign up information is provided on this After Visit Summary.  MyChart is used to connect with patients for Virtual Visits (Telemedicine).  Patients are able to view lab/test results, encounter notes, upcoming appointments, etc.  Non-urgent messages can be sent to your provider as well.   To learn more about what you can do with MyChart, go to [email or web address].    Your next appointment:   1 year(s)  The format for your next appointment:   In Person  Provider:   Rozann Lesches, MD   Other Instructions Thank you for choosing [facility]!

## 2021-01-06 NOTE — Progress Notes (Signed)
Cardiology Office Note    Date:  01/06/2021   ID:  Samantha Pierce, DOB 1971-03-02, MRN [id number]  PCP:  Rosita Fire, MD  Cardiologist: Rozann Lesches, MD    Chief Complaint  Patient presents with   Follow-up    Annual Visit    History of Present Illness:    Samantha Pierce is a 50 y.o. female with a past medical history of CAD (s/p cath in 2016 following asystole with Lexiscan --> cath showed 30% Proximal-LAD stenosis but no obstructive CAD), HTN, HLD, IDDM, prior CVA's and ESRD who presents to the office today for annual follow-up.  She most recently had a telehealth visit with Dr. Domenic Polite in 10/2019 and denied any recent chest pain or progressive dyspnea at that time. She did have a stationary bicycle and was planning to exercise more frequently. It was unclear if she was taking Plavix at that time which was previously prescribed following her CVA, therefore she was encouraged to reach out to her PCP to see if she should remain on this or start ASA. She was continued on Lipitor, Norvasc and Toprol-XL.  She is being followed closely by Vascular Surgery and recently underwent ligation of her left arm AV fistula on 01/04/2021 due to moderate to severe steal symptoms.  In talking with the patient today, she reports she did have surgery earlier this week as outlined above and has already started to notice improvement in the paresthesias and numbness along her left arm and hand. She is doing well with hemodialysis and reports her BP overall remains well controlled during her sessions without episodes of hypotension. Her BP is at 154/60 during today's visit but she has not yet taken her morning medications. Reports it is usually well controlled with her current regimen. She denies any specific dyspnea on exertion, orthopnea, PND or pitting edema. No recent chest pain or palpitations.   Past Medical History:  Diagnosis Date   Anemia    Anxiety    Asystole ([place])    a. 05/2015: pt  developed bradycardia->asystole during [place] nuclear stress test, s/p brief code. Cath with only 30% prox LCx. Her asystole during Lexiscan infusion was felt to represent an excessive pharmacologic response to the agent and likely superimposed vasovagal response.   Bipolar disorder ([place])    CKD (chronic kidney disease) stage 4, GFR 15-29 ml/min (HCC)    M_W_F dialysis   COPD (chronic obstructive pulmonary disease) (HCC)    Depression    Essential hypertension    GERD (gastroesophageal reflux disease)    History of blood transfusion    x 2   History of seizure 2017   only one seizure - none since   History of stroke April 2016   Acute lacunar infarct of the left thalamus   Leg cramps    Mild CAD    a. LHC 05/2015: 30% prox Cx, otherwise widely patent.   Morbid obesity ([place])    Neuromuscular disorder ([place])    neuropathy in feet   Neuropathy    feet   PTSD (post-traumatic stress disorder)    Stroke (HCC)    x 2, right side weakness   Type 2 diabetes mellitus ([place])     Past Surgical History:  Procedure Laterality Date   AV FISTULA PLACEMENT Right 01/08/2017   Procedure: RIGHT ARM BRACHIOCEPHALIC ARTERIOVENOUS (AV) FISTULA CREATION;  Surgeon: Waynetta Sandy, MD;  Location: [facility];  Service: Vascular;  Laterality: Right;   AV FISTULA PLACEMENT Left 11/20/2018  Procedure: Creation of LEFT ARM ARTERIOVENOUS (AV) FISTULA;  Surgeon: Waynetta Sandy, MD;  Location: [facility];  Service: Vascular;  Laterality: Left;   [place] Left 02/11/2019   Procedure: BASILIC VEIN TRANSPOSITION SECOND STAGE;  Surgeon: Waynetta Sandy, MD;  Location: [facility];  Service: Vascular;  Laterality: Left;   CARDIAC CATHETERIZATION N/A 06/01/2015   Procedure: Left Heart Cath and Coronary Angiography;  Surgeon: Belva Crome, MD; CFX 30%, no other dz, EF nl by echo   CESAREAN SECTION     x2   [place]     Right eye pars plano  vitrectomy    HEMATOMA EVACUATION Left 03/03/2019   Procedure: EVACUATION HEMATOMA;  Surgeon: Elam Dutch, MD;  Location: [facility];  Service: Vascular;  Laterality: Left;   INSERTION OF DIALYSIS CATHETER Right 01/04/2021   Procedure: INSERTION OF RIGHT INTERNAL JUGULAR TUNNELED DIALYSIS CATHETER;  Surgeon: Elam Dutch, MD;  Location: [facility];  Service: Vascular;  Laterality: Right;   LASIK     LIGATION OF ARTERIOVENOUS  FISTULA Left 01/04/2021   Procedure: LIGATION OF LEFT ARM ARTERIOVENOUS  FISTULA;  Surgeon: Elam Dutch, MD;  Location: [facility];  Service: Vascular;  Laterality: Left;   PARS PLANA VITRECTOMY  04/20/2011   Procedure: PARS PLANA VITRECTOMY WITH 25 GAUGE;  Surgeon: Hayden Pedro, MD;  Location: [facility];  Service: Ophthalmology;  Laterality: Left;  membrane peel, gas fluid exchange, endolaser, repair of complex retinal detachment   PATCH ANGIOPLASTY Left 10/05/2020   Procedure: PATCH ANGIOPLASTY USING Rueben Bash BIOLOGIC PATCH;  Surgeon: Elam Dutch, MD;  Location: [facility];  Service: Vascular;  Laterality: Left;   PERIPHERAL VASCULAR BALLOON ANGIOPLASTY Left 09/02/2020   Procedure: PERIPHERAL VASCULAR BALLOON ANGIOPLASTY;  Surgeon: Marty Heck, MD;  Location: [facility];  Service: Cardiovascular;  Laterality: Left;  arm fistula   REVISON OF ARTERIOVENOUS FISTULA Left 10/05/2020   Procedure: REVISION OF LEFT ARM ARTERIOVENOUS FISTULA;  Surgeon: Elam Dutch, MD;  [facility];  Service: Vascular;  Laterality: Left;   TUBAL LIGATION      Current Medications: Outpatient Medications Prior to Visit  Medication Sig Dispense Refill   albuterol (PROVENTIL HFA;VENTOLIN HFA) 108 (90 BASE) MCG/ACT inhaler Inhale 1-2 puffs into the lungs every 6 (six) hours as needed for wheezing or shortness of breath. 1 Inhaler 0   amLODipine (NORVASC) 10 MG tablet Take 1 tablet (10 mg total) by mouth daily. 30 tablet 1   atorvastatin (LIPITOR) 80 MG tablet Take 1 tablet (80 mg  total) by mouth daily at 6 PM. (Patient taking differently: Take 80 mg by mouth every morning.) 30 tablet 6   Cholecalciferol (VITAMIN D) 50 MCG (2000 UT) CAPS Take 2,000 Units by mouth daily.     cloNIDine (CATAPRES) 0.2 MG tablet Take 0.2 mg by mouth 2 (two) times daily.     FLUoxetine (PROZAC) 10 MG tablet Take 10 mg by mouth daily. Total dose '30mg'$      FLUoxetine (PROZAC) 20 MG capsule Take 20 mg by mouth daily. Total dose '30mg'$      furosemide (LASIX) 80 MG tablet Take 80 mg by mouth 2 (two) times daily with breakfast and lunch.     gabapentin (NEURONTIN) 300 MG capsule Take 300 mg by mouth at bedtime.      hydrALAZINE (APRESOLINE) 50 MG tablet Take 50 mg by mouth 2 (two) times daily.  insulin aspart (NOVOLOG) 100 UNIT/ML FlexPen Inject 0-16 Units into the skin 3 (three) times daily before meals. Sliding scale     Insulin Detemir (LEVEMIR) 100 UNIT/ML Pen Inject 50 Units into the skin at bedtime. (Patient taking differently: Inject 80 Units into the skin at bedtime.) 15 mL    lidocaine-prilocaine (EMLA) cream Apply topically 3 (three) times a week. Dialysis port access     metoprolol succinate (TOPROL-XL) 50 MG 24 hr tablet Take 50 mg by mouth daily.     OLANZapine (ZYPREXA) 2.5 MG tablet Take 2.5 mg by mouth at bedtime.     omeprazole (PRILOSEC) 20 MG capsule Take 20 mg by mouth daily before breakfast.      oxyCODONE-acetaminophen (PERCOCET) 10-325 MG tablet Take 1 tablet by mouth every 6 (six) hours as needed for pain. 12 tablet 0   sodium bicarbonate 650 MG tablet Take 650 mg by mouth 3 (three) times daily.     traZODone (DESYREL) 100 MG tablet Take 100 mg by mouth at bedtime.     Continuous Blood Gluc Receiver (DEXCOM G6 RECEIVER) DEVI USE TO CHECK GLUCOSE FOUR TIMES DAILY AS DIRECTED. (Patient not taking: Reported on 01/06/2021) 1 each 0   No facility-administered medications prior to visit.     Allergies:   Contrast media [iodinated diagnostic agents] and Penicillins   Social History    Socioeconomic History   Marital status: Single    Spouse name: Not on file   Number of children: Not on file   Years of education: Not on file   Highest education level: Not on file  Occupational History   Not on file  Tobacco Use   Smoking status: Former    Packs/day: 0.03    Years: 30.00    Pack years: 0.90    Types: Cigarettes    Start date: 06/05/1981    Quit date: 12/04/2018    Years since quitting: 2.0   Smokeless tobacco: Never  Vaping Use   Vaping Use: Never used  Substance and Sexual Activity   Alcohol use: Not Currently    Comment: occ wine   Drug use: No   Sexual activity: Yes    Birth control/protection: Surgical  Other Topics Concern   Not on file  Social History Narrative   Not on file   Social Determinants of Health   Financial Resource Strain: Not on file  Food Insecurity: Not on file  Transportation Needs: Not on file  Physical Activity: Not on file  Stress: Not on file  Social Connections: Not on file     Family History:  The patient's family history includes Diabetes in her father and mother; Kidney failure in her mother.   Review of Systems:    Please see the history of present illness.     All other systems reviewed and are otherwise negative except as noted above.   Physical Exam:    VS:  BP (!) 154/60   Pulse 62   Ht '5\' 4"'$  (1.626 m)   Wt 263 lb 12.8 oz (119.7 kg)   LMP 12/08/2020   SpO2 99%   BMI 45.28 kg/m    General: Pleasant obese female appearing in no acute distress. Head: Normocephalic, atraumatic. Neck: No carotid bruits. JVD not elevated.  Lungs: Respirations regular and unlabored, without wheezes or rales.  Heart: Regular rate and rhythm. No S3 or S4.  No murmur, no rubs, or gallops appreciated. Abdomen: Appears non-distended. No obvious abdominal masses. Msk:  Strength and tone appear  normal for age. No obvious joint deformities or effusions. Extremities: No clubbing or cyanosis. No pitting edema.  Distal pedal pulses  are 2+ bilaterally. Neuro: Alert and oriented X 3. Moves all extremities spontaneously. No focal deficits noted. Psych:  Responds to questions appropriately with a normal affect. Skin: No rashes or lesions noted  Wt Readings from Last 3 Encounters:  01/06/21 263 lb 12.8 oz (119.7 kg)  01/04/21 261 lb (118.4 kg)  12/30/20 264 lb (119.7 kg)     Studies/Labs Reviewed:   EKG:  EKG is not ordered today. EKG from 09/02/2020 is reviewed and shows NSR, HR 66 with no acute ST changes.   Recent Labs: 10/06/2020: Platelets 226 01/04/2021: BUN 34; Creatinine, Ser 5.30; Hemoglobin 12.6; Potassium 4.4; Sodium 132   Lipid Panel    Component Value Date/Time   CHOL 245 (H) 05/23/2015 0545   TRIG 278 (H) 05/23/2015 0545   HDL 45 05/23/2015 0545   CHOLHDL 5.4 05/23/2015 0545   VLDL 56 (H) 05/23/2015 0545   LDLCALC 144 (H) 05/23/2015 0545    Additional studies/ records that were reviewed today include:   Cardiac Catheterization: 05/2015 Prox Cx lesion, 30% stenosed.   The coronary arteries are widely patent with minimal plaque noted. 40 cc of contrast was used Left ventricular end-diastolic pressure is mildly elevated. Left ventriculography was not performed in an attempt to minimize contrast exposure. Asystole during adenosine infusion represents an excessive pharmacologic response to the agent and likely superimposed vasovagal response.     Recommendations:   Per treating team    Echocardiogram: 01/2019 IMPRESSIONS     1. The left ventricle has normal systolic function with an ejection  fraction of 60-65%. The cavity size was normal. There is moderately  increased left ventricular wall thickness. Left ventricular diastolic  Doppler parameters are indeterminate.   2. The right ventricle has normal systolic function. The cavity was  normal. There is no increase in right ventricular wall thickness.   3. Left atrial size was severely dilated.   4. Right atrial size was mildly dilated.    5. No evidence of mitral valve stenosis.   6. The aortic valve is tricuspid. No stenosis of the aortic valve.   7. The aorta is normal unless otherwise noted.   8. There is dilatation of the aortic root.   9. Pulmonary hypertension is indeterminate, inadequate TR jet.  10. The inferior vena cava was dilated in size with >50% respiratory  variability.   Assessment:    1. Coronary artery disease involving native coronary artery of native heart without angina pectoris   2. Essential hypertension   3. Hyperlipidemia LDL goal <70   4. History of CVA (cerebrovascular accident)   5. ESRD on dialysis [facility])      Plan:   In order of problems listed above:  1. CAD - She is s/p cath in 2016 which showed 30% Proximal-LAD stenosis but no obstructive CAD.  She denies any recent anginal symptoms. Continue Atorvastatin 80 mg daily and Toprol-XL 50 mg daily. I did encourage her to review with Vascular at her appointment later this month to see if she could restart ASA 81 mg daily given her history of CAD and prior CVA.  2. HTN - Her BP is elevated at 154/60 during today's visit but she has not yet taken her morning medications. She reports her BP is overall well-controlled when checked at dialysis. Continue current medication regimen with Amlodipine, Clonidine, Hydralazine and Toprol-XL.  3. HLD -  No recent FLP on file and will recheck with her upcoming labs. She remains on Atorvastatin 80 mg daily with goal LDL less than 70 in the setting of known CAD.  4. Prior CVA - She is on Atorvastatin '80mg'$  daily. I have recommended she restart ASA '81mg'$  daily once cleared by Vascular to do so given her history of CAD and prior CVA.   5. ESRD - She is on HD - MWF schedule. Remains on Lasix '80mg'$  BID per Nephrology as she still produces urine. Recently underwent ligation of her left arm AV fistula on 01/04/2021 by Vascular Surgery due to steal symptoms.   Medication Adjustments/Labs and Tests Ordered: Current  medicines are reviewed at length with the patient today.  Concerns regarding medicines are outlined above.  Medication changes, Labs and Tests ordered today are listed in the Patient Instructions below. Patient Instructions  Medication Instructions:  Your physician recommends that you continue on your current medications as directed. Please refer to the Current Medication list given to you today.  Restart Aspirin 81 mg Daily if ok with vascular on 01/20/21.   *If you need a refill on your cardiac medications before your next appointment, please call your pharmacy*   Lab Work: Your physician recommends that you return for lab work in: Fasting   If you have labs (blood work) drawn today and your tests are completely normal, you will receive your results only by: [place] (if you have [place]) OR A paper copy in the mail If you have any lab test that is abnormal or we need to change your treatment, we will call you to review the results.   Testing/Procedures: NONE    Follow-Up: At [facility], you and your health needs are our priority.  As part of our continuing mission to provide you with exceptional heart care, we have created designated Provider Care Teams.  These Care Teams include your primary Cardiologist (physician) and Advanced Practice Providers (APPs -  Physician Assistants and Nurse Practitioners) who all work together to provide you with the care you need, when you need it.  We recommend signing up for the patient portal called "MyChart".  Sign up information is provided on this After Visit Summary.  MyChart is used to connect with patients for Virtual Visits (Telemedicine).  Patients are able to view lab/test results, encounter notes, upcoming appointments, etc.  Non-urgent messages can be sent to your provider as well.   To learn more about what you can do with MyChart, go to [email or web address].    Your next appointment:   1 year(s)  The format for your next  appointment:   In Person  Provider:   Rozann Lesches, MD   Other Instructions Thank you for choosing [facility]!     Signed, Erma Heritage, PA-C  01/06/2021 4:54 PM    [facility] [address] [place], [place] [address] Phone: [phone] Fax: [phone]

## 2021-01-07 DIAGNOSIS — N186 End stage renal disease: Secondary | ICD-10-CM | POA: Diagnosis not present

## 2021-01-07 DIAGNOSIS — Z992 Dependence on renal dialysis: Secondary | ICD-10-CM | POA: Diagnosis not present

## 2021-01-10 DIAGNOSIS — N186 End stage renal disease: Secondary | ICD-10-CM | POA: Diagnosis not present

## 2021-01-10 DIAGNOSIS — N2581 Secondary hyperparathyroidism of renal origin: Secondary | ICD-10-CM | POA: Diagnosis not present

## 2021-01-10 DIAGNOSIS — Z992 Dependence on renal dialysis: Secondary | ICD-10-CM | POA: Diagnosis not present

## 2021-01-12 ENCOUNTER — Telehealth: Payer: Self-pay

## 2021-01-12 DIAGNOSIS — N186 End stage renal disease: Secondary | ICD-10-CM | POA: Diagnosis not present

## 2021-01-12 DIAGNOSIS — Z992 Dependence on renal dialysis: Secondary | ICD-10-CM | POA: Diagnosis not present

## 2021-01-12 NOTE — Telephone Encounter (Signed)
[employer]. They do not know what medication needs PA if any. The person I spoke with asked for the # I called and they state that was not their # even though she answered the phone. Pt or pharmacy has not contacted [place] requiring a PA on any meds.

## 2021-01-12 NOTE — Telephone Encounter (Signed)
Jose with [employer] left a Vm stating that the patient is waiting on a PA for a medication. He did not specify which medication. Please Advise. He said you can start a PA by calling [phone]

## 2021-01-13 DIAGNOSIS — R69 Illness, unspecified: Secondary | ICD-10-CM | POA: Diagnosis not present

## 2021-01-13 DIAGNOSIS — F411 Generalized anxiety disorder: Secondary | ICD-10-CM | POA: Diagnosis not present

## 2021-01-13 NOTE — Telephone Encounter (Signed)
After looking into this. There was no request for PA. The order was sent to [employer] and the pt refused the device. This was resent and a msg was lft for the pt to accept this if she still wants to use the Dexcom.

## 2021-01-13 NOTE — Telephone Encounter (Signed)
Pt states she needs a PA for her dexcom. I advised patient that we need something sent over to the office. She said this has been going on since May. Please Advise.

## 2021-01-14 DIAGNOSIS — Z992 Dependence on renal dialysis: Secondary | ICD-10-CM | POA: Diagnosis not present

## 2021-01-14 DIAGNOSIS — N186 End stage renal disease: Secondary | ICD-10-CM | POA: Diagnosis not present

## 2021-01-17 DIAGNOSIS — Z992 Dependence on renal dialysis: Secondary | ICD-10-CM | POA: Diagnosis not present

## 2021-01-17 DIAGNOSIS — N2581 Secondary hyperparathyroidism of renal origin: Secondary | ICD-10-CM | POA: Diagnosis not present

## 2021-01-17 DIAGNOSIS — N186 End stage renal disease: Secondary | ICD-10-CM | POA: Diagnosis not present

## 2021-01-18 DIAGNOSIS — F4310 Post-traumatic stress disorder, unspecified: Secondary | ICD-10-CM | POA: Diagnosis not present

## 2021-01-18 DIAGNOSIS — F411 Generalized anxiety disorder: Secondary | ICD-10-CM | POA: Diagnosis not present

## 2021-01-18 DIAGNOSIS — E1165 Type 2 diabetes mellitus with hyperglycemia: Secondary | ICD-10-CM | POA: Diagnosis not present

## 2021-01-18 DIAGNOSIS — R69 Illness, unspecified: Secondary | ICD-10-CM | POA: Diagnosis not present

## 2021-01-19 DIAGNOSIS — E1165 Type 2 diabetes mellitus with hyperglycemia: Secondary | ICD-10-CM | POA: Diagnosis not present

## 2021-01-19 DIAGNOSIS — Z992 Dependence on renal dialysis: Secondary | ICD-10-CM | POA: Diagnosis not present

## 2021-01-19 DIAGNOSIS — N186 End stage renal disease: Secondary | ICD-10-CM | POA: Diagnosis not present

## 2021-01-20 ENCOUNTER — Other Ambulatory Visit: Payer: Self-pay

## 2021-01-20 ENCOUNTER — Ambulatory Visit (HOSPITAL_COMMUNITY)
Admission: RE | Admit: 2021-01-20 | Discharge: 2021-01-20 | Disposition: A | Discharge status: Home / Self Care | Payer: Medicare HMO | Source: Ambulatory Visit | Attending: Vascular Surgery | Admitting: Vascular Surgery

## 2021-01-20 ENCOUNTER — Ambulatory Visit (INDEPENDENT_AMBULATORY_CARE_PROVIDER_SITE_OTHER)
Admission: RE | Admit: 2021-01-20 | Discharge: 2021-01-20 | Disposition: A | Discharge status: Home / Self Care | Payer: Medicare HMO | Source: Ambulatory Visit | Attending: Vascular Surgery | Admitting: Vascular Surgery

## 2021-01-20 ENCOUNTER — Ambulatory Visit (INDEPENDENT_AMBULATORY_CARE_PROVIDER_SITE_OTHER): Payer: Medicare HMO | Admitting: Physician Assistant

## 2021-01-20 VITALS — BP 113/75 | HR 63 | Temp 97.7°F | Resp 20 | Ht 64.0 in | Wt 260.0 lb

## 2021-01-20 DIAGNOSIS — N186 End stage renal disease: Secondary | ICD-10-CM

## 2021-01-20 DIAGNOSIS — Z992 Dependence on renal dialysis: Secondary | ICD-10-CM | POA: Diagnosis not present

## 2021-01-20 NOTE — Progress Notes (Signed)
Established Dialysis Access   History of Present Illness   Samantha Pierce is a 50 y.o. (December 06, 1970) female who presents for re-evaluation for permanent access.  The patient is left hand dominant.  Previous access procedures have been completed in the both the right and left upper arm. She previously had first and second stage basilic vein transposition in her left arm.  These were done by Dr. Donzetta Matters.  She developed a hematoma requiring evacuation by Dr. Oneida Alar. She then underwent revision of her AV fistula with a patch angioplasty of the axillary vein portion of the fistula by Dr. Oneida Alar in May of 2022.  Since that time she had increasing numbness tingling and weakness in the left hand which ultimately warranted ligation of her left BV fistula. She additionally has history of a right Cephalic vein fistula which was never used but thrombosed in 2020. She now is in need of a new permanent access.  She is currently dialyzing via a Right IJ TDC on MWF  Current Outpatient Medications  Medication Sig Dispense Refill   albuterol (PROVENTIL HFA;VENTOLIN HFA) 108 (90 BASE) MCG/ACT inhaler Inhale 1-2 puffs into the lungs every 6 (six) hours as needed for wheezing or shortness of breath. 1 Inhaler 0   amLODipine (NORVASC) 10 MG tablet Take 1 tablet (10 mg total) by mouth daily. 30 tablet 1   atorvastatin (LIPITOR) 80 MG tablet Take 1 tablet (80 mg total) by mouth daily at 6 PM. (Patient taking differently: Take 80 mg by mouth every morning.) 30 tablet 6   Cholecalciferol (VITAMIN D) 50 MCG (2000 UT) CAPS Take 2,000 Units by mouth daily.     cloNIDine (CATAPRES) 0.2 MG tablet Take 0.2 mg by mouth 2 (two) times daily.     Continuous Blood Gluc Receiver (DEXCOM G6 RECEIVER) DEVI USE TO CHECK GLUCOSE FOUR TIMES DAILY AS DIRECTED. 1 each 0   FLUoxetine (PROZAC) 10 MG tablet Take 10 mg by mouth daily. Total dose '30mg'$      FLUoxetine (PROZAC) 20 MG capsule Take 20 mg by mouth daily. Total dose '30mg'$       furosemide (LASIX) 80 MG tablet Take 80 mg by mouth 2 (two) times daily with breakfast and lunch.     gabapentin (NEURONTIN) 300 MG capsule Take 300 mg by mouth at bedtime.      hydrALAZINE (APRESOLINE) 50 MG tablet Take 50 mg by mouth 2 (two) times daily.     insulin aspart (NOVOLOG) 100 UNIT/ML FlexPen Inject 0-16 Units into the skin 3 (three) times daily before meals. Sliding scale     Insulin Detemir (LEVEMIR) 100 UNIT/ML Pen Inject 50 Units into the skin at bedtime. (Patient taking differently: Inject 80 Units into the skin at bedtime.) 15 mL    lidocaine-prilocaine (EMLA) cream Apply topically 3 (three) times a week. Dialysis port access     metoprolol succinate (TOPROL-XL) 50 MG 24 hr tablet Take 50 mg by mouth daily.     OLANZapine (ZYPREXA) 2.5 MG tablet Take 2.5 mg by mouth at bedtime.     omeprazole (PRILOSEC) 20 MG capsule Take 20 mg by mouth daily before breakfast.      oxyCODONE-acetaminophen (PERCOCET) 10-325 MG tablet Take 1 tablet by mouth every 6 (six) hours as needed for pain. 12 tablet 0   sodium bicarbonate 650 MG tablet Take 650 mg by mouth 3 (three) times daily.     traZODone (DESYREL) 100 MG tablet Take 100 mg by mouth at bedtime.  No current facility-administered medications for this visit.    On ROS today: negative unless stated in HPI   Physical Examination   Vitals:   01/20/21 1146  BP: 113/75  Pulse: 63  Resp: 20  Temp: 97.7 F (36.5 C)  SpO2: 100%  Weight: 260 lb (117.9 kg)  Height: '5\' 4"'$  (1.626 m)   Body mass index is 44.63 kg/m.  General Alert, O x 3, Obese, NAD  Pulmonary Sym exp, non labored   Cardiac Regular rate and rhythm ,   Vascular Vessel Right Left  Radial Palpable Palpable  Brachial Palpable Palpable  Ulnar Faintly palpable Faintly palpable    Musculo- skeletal Left AC incision is healing well. Mild small eschar M/S 5/5 throughout  , Extremities without ischemic changes    Neurologic A&O; CN grossly intact      Non-invasive Vascular Imaging    BUE Doppler (01/20/21):  R arm:  Brachial: tri, 0.49 mm Radial: tri, 0.23 mm Ulnar: tri, 0.24 mm L arm:  Brachial: tri, 0.44 mm Radial: tri, 0.25 mm Ulnar: tri, 0.25 mm  BUE Vein Mapping  (01/1821):  R arm: acceptable vein conduits include right Basilic Vein L arm: acceptable vein conduits include none    Medical Decision Making   Samantha Pierce is a 50 y.o. female who presents with ESRD requiring new permanent hemodialysis access. Prior failed access in both arms. Most recently left basilic vein fistula required ligation for steal. Her duplex today shows inadequate conduits in the left upper extremity. Additionally with her left arm being her dominant and also with having recent steal I would not recommend new access in her left arm.  The duplex does show adequate right basilic vein conduit for new fistula. I discussed with patient recommendation to use RUE for new fistula but that this would be a two stage surgery. If vein does not appear adequate or if this fistula fails she understands that she will require AV graft. Risks/ benefits of surgery were again discussed with patient and her aunt. They are agreeable to proceed. She dialyzes on MWF so will try to arrange this on non dialysis day. She is previously predominately a Dr. Oneida Alar patient and since he has recently retired she is agreeable to proceed with next earliest available physician.  Plan will be right basilic vein fistula vs graft. Will arrange for her surgery to be next available   Karoline Caldwell, PA-C [facility] Office: [phone]  Clinic MD: Scot Dock

## 2021-01-21 DIAGNOSIS — N186 End stage renal disease: Secondary | ICD-10-CM | POA: Diagnosis not present

## 2021-01-21 DIAGNOSIS — Z992 Dependence on renal dialysis: Secondary | ICD-10-CM | POA: Diagnosis not present

## 2021-01-24 DIAGNOSIS — N186 End stage renal disease: Secondary | ICD-10-CM | POA: Diagnosis not present

## 2021-01-24 DIAGNOSIS — Z992 Dependence on renal dialysis: Secondary | ICD-10-CM | POA: Diagnosis not present

## 2021-01-26 DIAGNOSIS — Z992 Dependence on renal dialysis: Secondary | ICD-10-CM | POA: Diagnosis not present

## 2021-01-26 DIAGNOSIS — N186 End stage renal disease: Secondary | ICD-10-CM | POA: Diagnosis not present

## 2021-01-27 DIAGNOSIS — I10 Essential (primary) hypertension: Secondary | ICD-10-CM | POA: Diagnosis not present

## 2021-01-27 DIAGNOSIS — N186 End stage renal disease: Secondary | ICD-10-CM | POA: Diagnosis not present

## 2021-01-27 DIAGNOSIS — J449 Chronic obstructive pulmonary disease, unspecified: Secondary | ICD-10-CM | POA: Diagnosis not present

## 2021-01-28 DIAGNOSIS — N186 End stage renal disease: Secondary | ICD-10-CM | POA: Diagnosis not present

## 2021-01-28 DIAGNOSIS — Z992 Dependence on renal dialysis: Secondary | ICD-10-CM | POA: Diagnosis not present

## 2021-01-31 DIAGNOSIS — N186 End stage renal disease: Secondary | ICD-10-CM | POA: Diagnosis not present

## 2021-01-31 DIAGNOSIS — Z992 Dependence on renal dialysis: Secondary | ICD-10-CM | POA: Diagnosis not present

## 2021-02-01 ENCOUNTER — Ambulatory Visit: Payer: Medicare HMO

## 2021-02-01 ENCOUNTER — Other Ambulatory Visit: Payer: Self-pay

## 2021-02-02 DIAGNOSIS — N186 End stage renal disease: Secondary | ICD-10-CM | POA: Diagnosis not present

## 2021-02-02 DIAGNOSIS — Z992 Dependence on renal dialysis: Secondary | ICD-10-CM | POA: Diagnosis not present

## 2021-02-03 DIAGNOSIS — N186 End stage renal disease: Secondary | ICD-10-CM | POA: Diagnosis not present

## 2021-02-03 DIAGNOSIS — T82898A Other specified complication of vascular prosthetic devices, implants and grafts, initial encounter: Secondary | ICD-10-CM | POA: Diagnosis not present

## 2021-02-03 DIAGNOSIS — Z992 Dependence on renal dialysis: Secondary | ICD-10-CM | POA: Diagnosis not present

## 2021-02-04 DIAGNOSIS — Z992 Dependence on renal dialysis: Secondary | ICD-10-CM | POA: Diagnosis not present

## 2021-02-04 DIAGNOSIS — N186 End stage renal disease: Secondary | ICD-10-CM | POA: Diagnosis not present

## 2021-02-07 DIAGNOSIS — Z992 Dependence on renal dialysis: Secondary | ICD-10-CM | POA: Diagnosis not present

## 2021-02-07 DIAGNOSIS — N186 End stage renal disease: Secondary | ICD-10-CM | POA: Diagnosis not present

## 2021-02-08 DIAGNOSIS — F411 Generalized anxiety disorder: Secondary | ICD-10-CM | POA: Diagnosis not present

## 2021-02-08 DIAGNOSIS — F332 Major depressive disorder, recurrent severe without psychotic features: Secondary | ICD-10-CM | POA: Diagnosis not present

## 2021-02-08 DIAGNOSIS — R69 Illness, unspecified: Secondary | ICD-10-CM | POA: Diagnosis not present

## 2021-02-09 DIAGNOSIS — N186 End stage renal disease: Secondary | ICD-10-CM | POA: Diagnosis not present

## 2021-02-09 DIAGNOSIS — Z992 Dependence on renal dialysis: Secondary | ICD-10-CM | POA: Diagnosis not present

## 2021-02-11 DIAGNOSIS — N186 End stage renal disease: Secondary | ICD-10-CM | POA: Diagnosis not present

## 2021-02-11 DIAGNOSIS — Z992 Dependence on renal dialysis: Secondary | ICD-10-CM | POA: Diagnosis not present

## 2021-02-14 DIAGNOSIS — N186 End stage renal disease: Secondary | ICD-10-CM | POA: Diagnosis not present

## 2021-02-14 DIAGNOSIS — N2581 Secondary hyperparathyroidism of renal origin: Secondary | ICD-10-CM | POA: Diagnosis not present

## 2021-02-14 DIAGNOSIS — D509 Iron deficiency anemia, unspecified: Secondary | ICD-10-CM | POA: Diagnosis not present

## 2021-02-14 DIAGNOSIS — Z992 Dependence on renal dialysis: Secondary | ICD-10-CM | POA: Diagnosis not present

## 2021-02-16 DIAGNOSIS — Z992 Dependence on renal dialysis: Secondary | ICD-10-CM | POA: Diagnosis not present

## 2021-02-16 DIAGNOSIS — N186 End stage renal disease: Secondary | ICD-10-CM | POA: Diagnosis not present

## 2021-02-17 ENCOUNTER — Telehealth: Payer: Self-pay | Admitting: Internal Medicine

## 2021-02-17 ENCOUNTER — Telehealth: Payer: Self-pay | Admitting: Nurse Practitioner

## 2021-02-17 NOTE — Telephone Encounter (Signed)
When pt changed her dexcom she threw the whole thing away and forgot that the transmitter was inside of it. She is needing a new transmitter. Pt would like to be notified when this has been sent in.   [id number] - [place], [place] [address]

## 2021-02-17 NOTE — Telephone Encounter (Signed)
   Samantha Pierce DOB: 05-15-1971 MRN: [id number]   [facility]ER WAIVER AND RELEASE OF LIABILITY  For purposes of improving physical access to our facilities, [facility] is pleased to partner with third parties to provide [facility] patients or other authorized individuals the option of convenient, on-demand ground transportation services (the Technical brewer") through use of the technology service that enables users to request on-demand ground transportation from independent third-party providers.  By opting to use and accept these [employer], I, the undersigned, hereby agree on behalf of myself, and on behalf of any minor child using the Government social research officer for whom I am the parent or legal guardian, as follows:  Government social research officer provided to me are provided by independent third-party transportation providers who are not [employer] or employees and who are unaffiliated with [employer]. [facility] is neither a transportation carrier nor a common or public carrier. [facility] has no control over the quality or safety of the transportation that occurs as a result of the [employer]. [facility] cannot guarantee that any third-party transportation provider will complete any arranged transportation service. [facility] makes no representation, warranty, or guarantee regarding the reliability, timeliness, quality, safety, suitability, or availability of any of the Transport Services or that they will be error free. I fully understand that traveling by vehicle involves risks and dangers of serious bodily injury, including permanent disability, paralysis, and death. I agree, on behalf of myself and on behalf of any minor child using the Transport Services for whom I am the parent or legal guardian, that the entire risk arising out of my use of the [employer] remains solely with me, to the maximum extent permitted under applicable law. The [employer] are provided  "as is" and "as available." [facility] disclaims all representations and warranties, express, implied or statutory, not expressly set out in these terms, including the implied warranties of merchantability and fitness for a particular purpose. I hereby waive and release [facility], its agents, employees, officers, directors, representatives, insurers, attorneys, assigns, successors, subsidiaries, and affiliates from any and all past, present, or future claims, demands, liabilities, actions, causes of action, or suits of any kind directly or indirectly arising from acceptance and use of the [employer]. I further waive and release [facility] and its affiliates from all present and future liability and responsibility for any injury or death to persons or damages to property caused by or related to the use of the [employer]. I have read this Waiver and Release of Liability, and I understand the terms used in it and their legal significance. This Waiver is freely and voluntarily given with the understanding that my right (as well as the right of any minor child for whom I am the parent or legal guardian using the [employer]) to legal recourse against [facility] in connection with the [employer] is knowingly surrendered in return for use of these services.   I attest that I read the consent document to Samantha Pierce, gave Ms. Peeks the opportunity to ask questions and answered the questions asked (if any). I affirm that Samantha Pierce then provided consent for she's participation in this program.     Samantha Pierce

## 2021-02-17 NOTE — Telephone Encounter (Signed)
Left a message requesting a return call to the office. 

## 2021-02-18 DIAGNOSIS — N186 End stage renal disease: Secondary | ICD-10-CM | POA: Diagnosis not present

## 2021-02-18 DIAGNOSIS — Z992 Dependence on renal dialysis: Secondary | ICD-10-CM | POA: Diagnosis not present

## 2021-02-18 DIAGNOSIS — E1165 Type 2 diabetes mellitus with hyperglycemia: Secondary | ICD-10-CM | POA: Diagnosis not present

## 2021-02-18 NOTE — Telephone Encounter (Signed)
Reminded pt the importance to remove her transmitter from the sensor when changing due to the transmitter lasting 3 months. Advised pt the office will give her a sample of the Dexcom G6 transmitter. She stated she has an appointment with [place] this coming Tuesday and will pick it up at that time. Stated she would revert back to sticking her finger until her visit.

## 2021-02-21 ENCOUNTER — Ambulatory Visit: Payer: Medicare HMO | Admitting: Nurse Practitioner

## 2021-02-21 DIAGNOSIS — N186 End stage renal disease: Secondary | ICD-10-CM | POA: Diagnosis not present

## 2021-02-21 DIAGNOSIS — Z992 Dependence on renal dialysis: Secondary | ICD-10-CM | POA: Diagnosis not present

## 2021-02-21 NOTE — Patient Instructions (Signed)

## 2021-02-22 ENCOUNTER — Ambulatory Visit (INDEPENDENT_AMBULATORY_CARE_PROVIDER_SITE_OTHER): Payer: Medicare HMO | Admitting: Nurse Practitioner

## 2021-02-22 ENCOUNTER — Encounter: Payer: Self-pay | Admitting: Nurse Practitioner

## 2021-02-22 VITALS — BP 121/70 | HR 71 | Ht 64.0 in | Wt 264.0 lb

## 2021-02-22 DIAGNOSIS — N184 Chronic kidney disease, stage 4 (severe): Secondary | ICD-10-CM

## 2021-02-22 DIAGNOSIS — I10 Essential (primary) hypertension: Secondary | ICD-10-CM | POA: Diagnosis not present

## 2021-02-22 DIAGNOSIS — E1165 Type 2 diabetes mellitus with hyperglycemia: Secondary | ICD-10-CM

## 2021-02-22 DIAGNOSIS — E1122 Type 2 diabetes mellitus with diabetic chronic kidney disease: Secondary | ICD-10-CM | POA: Diagnosis not present

## 2021-02-22 DIAGNOSIS — E782 Mixed hyperlipidemia: Secondary | ICD-10-CM

## 2021-02-22 DIAGNOSIS — IMO0002 Reserved for concepts with insufficient information to code with codable children: Secondary | ICD-10-CM

## 2021-02-22 LAB — POCT GLYCOSYLATED HEMOGLOBIN (HGB A1C): Hemoglobin A1C: 10.2 % — AB (ref 4.0–5.6)

## 2021-02-22 MED ORDER — INSULIN DETEMIR 100 UNIT/ML FLEXPEN: 90.0000 [IU] | PEN_INJECTOR | Freq: Every day | SUBCUTANEOUS | Status: DC

## 2021-02-22 NOTE — Progress Notes (Signed)
Endocrinology Follow Up Visit      02/22/2021, 3:13 PM   Subjective:    Patient ID: Samantha Pierce, female    DOB: 21-Mar-1971.  Samantha Pierce is being seen in follow up after being seen in consultation for management of currently uncontrolled symptomatic diabetes requested by  Rosita Fire, MD.   Past Medical History:  Diagnosis Date   Anemia    Anxiety    Asystole ([place])    a. 05/2015: pt developed bradycardia->asystole during [place] nuclear stress test, s/p brief code. Cath with only 30% prox LCx. Her asystole during Lexiscan infusion was felt to represent an excessive pharmacologic response to the agent and likely superimposed vasovagal response.   Bipolar disorder ([place])    CKD (chronic kidney disease) stage 4, GFR 15-29 ml/min (HCC)    M_W_F dialysis   COPD (chronic obstructive pulmonary disease) (HCC)    Depression    Essential hypertension    GERD (gastroesophageal reflux disease)    History of blood transfusion    x 2   History of seizure 2017   only one seizure - none since   History of stroke April 2016   Acute lacunar infarct of the left thalamus   Leg cramps    Mild CAD    a. LHC 05/2015: 30% prox Cx, otherwise widely patent.   Morbid obesity ([place])    Neuromuscular disorder ([place])    neuropathy in feet   Neuropathy    feet   PTSD (post-traumatic stress disorder)    Stroke (HCC)    x 2, right side weakness   Type 2 diabetes mellitus ([place])     Past Surgical History:  Procedure Laterality Date   AV FISTULA PLACEMENT Right 01/08/2017   Procedure: RIGHT ARM BRACHIOCEPHALIC ARTERIOVENOUS (AV) FISTULA CREATION;  Surgeon: Waynetta Sandy, MD;  Location: [facility];  Service: Vascular;  Laterality: Right;   AV FISTULA PLACEMENT Left 11/20/2018   Procedure: Creation of LEFT ARM ARTERIOVENOUS (AV) FISTULA;  Surgeon: Waynetta Sandy, MD;  Location: [facility];  Service: Vascular;   Laterality: Left;   [place] Left 02/11/2019   Procedure: BASILIC VEIN TRANSPOSITION SECOND STAGE;  Surgeon: Waynetta Sandy, MD;  Location: [facility];  Service: Vascular;  Laterality: Left;   CARDIAC CATHETERIZATION N/A 06/01/2015   Procedure: Left Heart Cath and Coronary Angiography;  Surgeon: Belva Crome, MD; CFX 30%, no other dz, EF nl by echo   CESAREAN SECTION     x2   [place]     Right eye pars plano vitrectomy    HEMATOMA EVACUATION Left 03/03/2019   Procedure: EVACUATION HEMATOMA;  Surgeon: Elam Dutch, MD;  Location: [facility];  Service: Vascular;  Laterality: Left;   INSERTION OF DIALYSIS CATHETER Right 01/04/2021   Procedure: INSERTION OF RIGHT INTERNAL JUGULAR TUNNELED DIALYSIS CATHETER;  Surgeon: Elam Dutch, MD;  Location: [facility];  Service: Vascular;  Laterality: Right;   LASIK     LIGATION OF ARTERIOVENOUS  FISTULA Left 01/04/2021   Procedure: LIGATION OF LEFT ARM ARTERIOVENOUS  FISTULA;  Surgeon: Elam Dutch, MD;  Location: [facility];  Service: Vascular;  Laterality: Left;   PARS PLANA VITRECTOMY  04/20/2011   Procedure: PARS PLANA VITRECTOMY WITH 25 GAUGE;  Surgeon: Hayden Pedro, MD;  Location: [facility];  Service: Ophthalmology;  Laterality: Left;  membrane peel, gas fluid exchange, endolaser, repair of complex retinal detachment   PATCH ANGIOPLASTY Left 10/05/2020   Procedure: PATCH ANGIOPLASTY USING Rueben Bash BIOLOGIC PATCH;  Surgeon: Elam Dutch, MD;  Location: [facility];  Service: Vascular;  Laterality: Left;   PERIPHERAL VASCULAR BALLOON ANGIOPLASTY Left 09/02/2020   Procedure: PERIPHERAL VASCULAR BALLOON ANGIOPLASTY;  Surgeon: Marty Heck, MD;  Location: [facility];  Service: Cardiovascular;  Laterality: Left;  arm fistula   REVISON OF ARTERIOVENOUS FISTULA Left 10/05/2020   Procedure: REVISION OF LEFT ARM ARTERIOVENOUS FISTULA;  Surgeon: Elam Dutch, MD;  Location: [facility];   Service: Vascular;  Laterality: Left;   TUBAL LIGATION      Social History   Socioeconomic History   Marital status: Single    Spouse name: Not on file   Number of children: Not on file   Years of education: Not on file   Highest education level: Not on file  Occupational History   Not on file  Tobacco Use   Smoking status: Former    Packs/day: 0.03    Years: 30.00    Pack years: 0.90    Types: Cigarettes    Start date: 06/05/1981    Quit date: 12/04/2018    Years since quitting: 2.2   Smokeless tobacco: Never  Vaping Use   Vaping Use: Never used  Substance and Sexual Activity   Alcohol use: Not Currently    Comment: occ wine   Drug use: No   Sexual activity: Yes    Birth control/protection: Surgical  Other Topics Concern   Not on file  Social History Narrative   Not on file   Social Determinants of Health   Financial Resource Strain: Not on file  Food Insecurity: Not on file  Transportation Needs: Not on file  Physical Activity: Not on file  Stress: Not on file  Social Connections: Not on file    Family History  Problem Relation Age of Onset   Diabetes Mother    Kidney failure Mother    Diabetes Father    Amblyopia Neg Hx    Blindness Neg Hx    Cataracts Neg Hx    Glaucoma Neg Hx    Macular degeneration Neg Hx    Retinal detachment Neg Hx    Strabismus Neg Hx    Retinitis pigmentosa Neg Hx     Outpatient Encounter Medications as of 02/22/2021  Medication Sig   albuterol (PROVENTIL HFA;VENTOLIN HFA) 108 (90 BASE) MCG/ACT inhaler Inhale 1-2 puffs into the lungs every 6 (six) hours as needed for wheezing or shortness of breath.   amLODipine (NORVASC) 10 MG tablet Take 1 tablet (10 mg total) by mouth daily.   atorvastatin (LIPITOR) 80 MG tablet Take 1 tablet (80 mg total) by mouth daily at 6 PM. (Patient taking differently: Take 80 mg by mouth every morning.)   Cholecalciferol (VITAMIN D) 50 MCG (2000 UT) CAPS Take 2,000 Units by mouth daily.   cloNIDine  (CATAPRES) 0.2 MG tablet Take 0.2 mg by mouth 2 (two) times daily.   Continuous Blood Gluc Receiver (DEXCOM G6 RECEIVER) DEVI USE TO CHECK GLUCOSE FOUR TIMES DAILY AS DIRECTED.   FLUoxetine (PROZAC) 10 MG tablet Take 10 mg by mouth See admin instructions. Take with 20  mg for a total of 30 mg daily   FLUoxetine (PROZAC) 20 MG capsule Take 20 mg by mouth See admin instructions. Take with 10 mg for a total of 30 mg daily   furosemide (LASIX) 80 MG tablet Take 80 mg by mouth 2 (two) times daily with breakfast and lunch.   gabapentin (NEURONTIN) 300 MG capsule Take 300 mg by mouth at bedtime.    hydrALAZINE (APRESOLINE) 50 MG tablet Take 50 mg by mouth 2 (two) times daily.   insulin aspart (NOVOLOG) 100 UNIT/ML FlexPen Inject 10-16 Units into the skin 3 (three) times daily before meals. Sliding scale   Lancets (ONETOUCH DELICA PLUS 123XX123) MISC Apply topically.   metoprolol succinate (TOPROL-XL) 50 MG 24 hr tablet Take 50 mg by mouth daily.   OLANZapine (ZYPREXA) 2.5 MG tablet Take 2.5 mg by mouth at bedtime.   omeprazole (PRILOSEC) 20 MG capsule Take 20 mg by mouth daily before breakfast.    ONETOUCH VERIO test strip SMARTSIG:Via Meter   sodium bicarbonate 650 MG tablet Take 1,950 mg by mouth daily.   traZODone (DESYREL) 100 MG tablet Take 100 mg by mouth at bedtime.   [DISCONTINUED] Insulin Detemir (LEVEMIR) 100 UNIT/ML Pen Inject 50 Units into the skin at bedtime. (Patient taking differently: Inject 80 Units into the skin at bedtime.)   insulin detemir (LEVEMIR) 100 UNIT/ML FlexPen Inject 90 Units into the skin at bedtime.   oxyCODONE-acetaminophen (PERCOCET) 10-325 MG tablet Take 1 tablet by mouth every 6 (six) hours as needed for pain. (Patient not taking: No sig reported)   No facility-administered encounter medications on file as of 02/22/2021.    ALLERGIES: Allergies  Allergen Reactions   Contrast Media [Iodinated Diagnostic Agents] Anaphylaxis    "code blue--I died"   Penicillins  Rash and Other (See Comments)    Reaction: Childhood     VACCINATION STATUS: Immunization History  Administered Date(s) Administered   Influenza,trivalent, recombinat, inj, PF 07/05/2016   Moderna Sars-Covid-2 Vaccination 10/27/2019, 11/24/2019   Pneumococcal Polysaccharide-23 09/05/2014    Diabetes She presents for her follow-up diabetic visit. She has type 2 diabetes mellitus. Onset time: She was diagnosed at approximate age of 17. Her disease course has been fluctuating. There are no hypoglycemic associated symptoms. Associated symptoms include blurred vision, fatigue and foot paresthesias. Pertinent negatives for diabetes include no polydipsia, no polyphagia and no polyuria. There are no hypoglycemic complications. Symptoms are improving. Diabetic complications include a CVA, heart disease, nephropathy, peripheral neuropathy and retinopathy. (Legally blind in right eye, says she was hospitalized for DKA once; now on HD) Risk factors for coronary artery disease include diabetes mellitus, dyslipidemia, hypertension, obesity, sedentary lifestyle and tobacco exposure. Current diabetic treatment includes intensive insulin program. She is compliant with treatment some of the time (will skip on dialysis days). Her weight is fluctuating minimally. She is following a generally healthy diet. When asked about meal planning, she reported none. She has not had a previous visit with a dietitian. She rarely participates in exercise. Her home blood glucose trend is fluctuating minimally. (She presents today with her CGM and meter (had trouble with her Dexcom sensor/transmitter) showing above target fasting and postprandial glycemic profile.  Her POCT A1c today is 10.2%, worsening from previous visit of 8.7% (says it was 11+% recently-therefore she is happy it is coming back down).  She still has a tendency to skip her mealtime insulin on dialysis days.  Analysis of her CGM shows TIR 21%, TAR 78%, TBR < 1%.) An ACE  inhibitor/angiotensin II receptor blocker is not being taken. She does not see a podiatrist.Eye exam is current.  Hyperlipidemia This is a chronic problem. The current episode started more than 1 year ago. The problem is uncontrolled. Recent lipid tests were reviewed and are high. Exacerbating diseases include chronic renal disease, diabetes and obesity. Factors aggravating her hyperlipidemia include beta blockers and fatty foods. Current antihyperlipidemic treatment includes statins. The current treatment provides moderate improvement of lipids. Compliance problems include adherence to diet and adherence to exercise.  Risk factors for coronary artery disease include diabetes mellitus, dyslipidemia, hypertension, obesity and a sedentary lifestyle.  Hypertension This is a chronic problem. The current episode started more than 1 year ago. The problem has been gradually improving since onset. The problem is controlled. Associated symptoms include blurred vision. There are no associated agents to hypertension. Risk factors for coronary artery disease include diabetes mellitus, dyslipidemia, obesity, sedentary lifestyle and smoking/tobacco exposure. Past treatments include calcium channel blockers, diuretics, central alpha agonists and beta blockers. The current treatment provides mild improvement. There are no compliance problems.  Hypertensive end-organ damage includes kidney disease, CAD/MI, CVA, heart failure and retinopathy. Identifiable causes of hypertension include chronic renal disease.   Review of systems  Constitutional: + Minimally fluctuating body weight,  current Body mass index is 45.32 kg/m. , + fatigue, no subjective hyperthermia, no subjective hypothermia Eyes: + blurry vision (diabetic retinopathy and legally blind in Right eye), no xerophthalmia ENT: no sore throat, no nodules palpated in throat, no dysphagia/odynophagia, no hoarseness Cardiovascular: no chest pain, no shortness of breath,  no palpitations, no leg swelling Respiratory: no cough, no shortness of breath Gastrointestinal: no nausea/vomiting/diarrhea Musculoskeletal: no muscle/joint aches Skin: no rashes, no hyperemia Neurological: no tremors, no dizziness, + numbness/tingling to BLE R>L Psychiatric: no depression, no anxiety   Objective:     BP 121/70   Pulse 71   Ht '5\' 4"'$  (1.626 m)   Wt 264 lb (119.7 kg)   BMI 45.32 kg/m   Wt Readings from Last 3 Encounters:  02/22/21 264 lb (119.7 kg)  01/20/21 260 lb (117.9 kg)  01/06/21 263 lb 12.8 oz (119.7 kg)    BP Readings from Last 3 Encounters:  02/22/21 121/70  01/20/21 113/75  01/06/21 (!) 154/60     Physical Exam- Limited  Constitutional:  Body mass index is 45.32 kg/m. , not in acute distress, normal state of mind Eyes:  EOMI, no exophthalmos Neck: Supple Cardiovascular: RRR, no murmurs, rubs, or gallops, no edema Respiratory: Adequate breathing efforts, no crackles, rales, rhonchi, or wheezing Musculoskeletal: no gross deformities, strength intact in all four extremities, no gross restriction of joint movements Skin:  no rashes, no hyperemia Neurological: no tremor with outstretched hands    CMP ( most recent) CMP     Component Value Date/Time   NA 132 (L) 01/04/2021 1108   K 4.4 01/04/2021 1108   CL 105 01/04/2021 1108   CO2 22 10/06/2020 0137   GLUCOSE 193 (H) 01/04/2021 1108   BUN 34 (H) 01/04/2021 1108   CREATININE 5.30 (H) 01/04/2021 1108   CALCIUM 8.2 (L) 10/06/2020 0137   PROT 6.8 02/22/2019 1222   ALBUMIN 2.9 (L) 02/22/2019 1222   AST 16 02/22/2019 1222   ALT 13 02/22/2019 1222   ALKPHOS 60 02/22/2019 1222   BILITOT 0.6 02/22/2019 1222   GFRNONAA 8 (L) 10/06/2020 0137   GFRAA 12 (L) 03/04/2019 0616     Diabetic Labs (most recent): Lab Results  Component Value Date  HGBA1C 10.2 (A) 02/22/2021   HGBA1C 8.7 (H) 10/05/2020   HGBA1C 12.4 05/07/2020     Lipid Panel ( most recent) Lipid Panel     Component Value  Date/Time   CHOL 245 (H) 05/23/2015 0545   TRIG 278 (H) 05/23/2015 0545   HDL 45 05/23/2015 0545   CHOLHDL 5.4 05/23/2015 0545   VLDL 56 (H) 05/23/2015 0545   LDLCALC 144 (H) 05/23/2015 0545      Lab Results  Component Value Date   TSH 1.524 09/04/2014           Assessment & Plan:   1) Uncontrolled diabetes mellitus with stage 4 chronic kidney disease ([place])  She presents today with her CGM and meter (had trouble with her Dexcom sensor/transmitter) showing above target fasting and postprandial glycemic profile.  Her POCT A1c today is 10.2%, worsening from previous visit of 8.7% (says it was 11+% recently-therefore she is happy it is coming back down).  She still has a tendency to skip her mealtime insulin on dialysis days.  Analysis of her CGM shows TIR 21%, TAR 78%, TBR < 1%.  - Samantha Pierce has currently uncontrolled symptomatic type 2 DM since 50 years of age.  -Recent labs reviewed.  - I had a long discussion with her about the progressive nature of diabetes and the pathology behind its complications. -her diabetes is complicated by CVA, retinopathy, neuropathy, CKD, and DKA x 1 episode and she remains at a high risk for more acute and chronic complications which include worsening CAD, CVA, CKD, retinopathy, and neuropathy. These are all discussed in detail with her.  - Nutritional counseling repeated at each appointment due to patients tendency to fall back in to old habits.  - The patient admits there is a room for improvement in their diet and drink choices. -  Suggestion is made for the patient to avoid simple carbohydrates from their diet including Cakes, Sweet Desserts / Pastries, Ice Cream, Soda (diet and regular), Sweet Tea, Candies, Chips, Cookies, Sweet Pastries, Store Bought Juices, Alcohol in Excess of 1-2 drinks a day, Artificial Sweeteners, Coffee Creamer, and "Sugar-free" Products. This will help patient to have stable blood glucose profile and potentially  avoid unintended weight gain.   - I encouraged the patient to switch to unprocessed or minimally processed complex starch and increased protein intake (animal or plant source), fruits, and vegetables.   - Patient is advised to stick to a routine mealtimes to eat 3 meals a day and avoid unnecessary snacks (to snack only to correct hypoglycemia).  - she will be scheduled with Jearld Fenton, RDN, CDE for diabetes education.  - I have approached her with the following individualized plan to manage  her diabetes and patient agrees:   -Based on her hyperglycemia overall, she is advised to increase her Levemir to 90 units SQ nightly and continue same dose of Novolog 10-16 units TID with meals if lgucose is above 90 and she is eating (Specific instructions on how to titrate insulin dosage based on glucose readings given to patient in writing).   -she is encouraged to continue monitoring blood glucose 4 times daily, before meals and before bed, and to call the clinic if she has readings less than 70 or greater than 300 for 3 tests in a row.    -She has benefited from CGM device and is advised to continue using it.  She is still adjusting to using it.  Her previous Rx was fulfilled with [facility].  -  she is warned not to take insulin without proper monitoring per orders. - Adjustment parameters are given to her for hypo and hyperglycemia in writing.  - she is not a candidate for Metformin, SGLT2 due to concurrent renal insufficiency.  - she will be considered for incretin therapy as appropriate next visit.  - Specific targets for  A1c;  LDL, HDL,  and Triglycerides were discussed with the patient.  2) Blood Pressure /Hypertension:  her blood pressure is controlled to target.   she is advised to continue her current medications including Norvasc 10 mg po daily, Clonidine 0.2 mg po daily, Lasix 80 mg po twice daily, Hydralazine 50 mg po twice daily and Metoprolol 50 mg  mg p.o. daily with breakfast.   Will defer any med changes to nephrology.  3) Lipids/Hyperlipidemia:    There is no recent lipid panel to review.  She is advised to continue Lipitor 80 mg po daily before bed.  Side effects and precautions discussed with her.  Will recheck lipid panel prior to next visit. (I see another provider has ordered this test, will await copy).  4)  Weight/Diet:  her Body mass index is 45.32 kg/m.  -  clearly complicating her diabetes care.   she is a candidate for weight loss. I discussed with her the fact that loss of 5 - 10% of her  current body weight will have the most impact on her diabetes management.  Exercise, and detailed carbohydrates information provided  -  detailed on discharge instructions.  5) Chronic Care/Health Maintenance: -she is not on ACEI/ARB and is on Statin medications and is encouraged to initiate and continue to follow up with Ophthalmology, Dentist, Podiatrist at least yearly or according to recommendations, and advised to stay away from smoking. I have recommended yearly flu vaccine and pneumonia vaccine at least every 5 years; moderate intensity exercise for up to 150 minutes weekly; and sleep for at least 7 hours a day.  - she is advised to maintain close follow up with Rosita Fire, MD for primary care needs, as well as her other providers for optimal and coordinated care.      I spent 40 minutes in the care of the patient today including review of labs from [facility], Lipids, Thyroid Function, Hematology (current and previous including abstractions from other facilities); face-to-face time discussing  her blood glucose readings/logs, discussing hypoglycemia and hyperglycemia episodes and symptoms, medications doses, her options of short and long term treatment based on the latest standards of care / guidelines;  discussion about incorporating lifestyle medicine;  and documenting the encounter.    Please refer to Patient Instructions for Blood Glucose Monitoring and  Insulin/Medications Dosing Guide"  in media tab for additional information. Please  also refer to " Patient Self Inventory" in the Media  tab for reviewed elements of pertinent patient history.  Samantha Pierce participated in the discussions, expressed understanding, and voiced agreement with the above plans.  All questions were answered to her satisfaction. she is encouraged to contact clinic should she have any questions or concerns prior to her return visit.   Follow up plan: - Return in about 3 months (around 05/24/2021) for Diabetes F/U with A1c in office, No previsit labs, Bring meter and logs.  Samantha Pierce, [facility] [address] [place], [place] [address] Phone: [phone] Fax: [phone]  02/22/2021, 3:13 PM

## 2021-02-23 ENCOUNTER — Encounter (HOSPITAL_COMMUNITY): Payer: Self-pay | Admitting: Vascular Surgery

## 2021-02-23 ENCOUNTER — Other Ambulatory Visit: Payer: Self-pay

## 2021-02-23 DIAGNOSIS — N186 End stage renal disease: Secondary | ICD-10-CM | POA: Diagnosis not present

## 2021-02-23 DIAGNOSIS — Z992 Dependence on renal dialysis: Secondary | ICD-10-CM | POA: Diagnosis not present

## 2021-02-23 NOTE — Progress Notes (Signed)
Spoke with pt.given pre-op instructions  Pt instructed on09/21/2022 take only 50% dose of levemir insulin. On 02/24/2021 only take novolog aspart if CBG is over 220 then take half of usual correction dose.

## 2021-02-23 NOTE — Anesthesia Preprocedure Evaluation (Addendum)
Anesthesia Evaluation  Patient identified by MRN, date of birth, ID band Patient awake    Reviewed: Allergy & Precautions, NPO status , Patient's Chart, lab work & pertinent test results, reviewed documented beta blocker date and time   History of Anesthesia Complications Negative for: history of anesthetic complications  Airway Mallampati: II  TM Distance: >3 FB Neck ROM: Full    Dental  (+) Missing   Pulmonary COPD, Current SmokerPatient did not abstain from smoking.,    Pulmonary exam normal        Cardiovascular hypertension, Pt. on medications and Pt. on home beta blockers + CAD and +CHF  Normal cardiovascular exam   '20 TTE - EF 60-65%. Moderately increased left ventricular wall thickness. Left atrial size was severely dilated. Right atrial size was mildly dilated.  There is dilatation of the aortic root. Pulmonary hypertension is indeterminate    Neuro/Psych Seizures -, Well Controlled,  PSYCHIATRIC DISORDERS Anxiety Depression Bipolar Disorder TIACVA (right sided weakness), Residual Symptoms    GI/Hepatic GERD  Medicated and Controlled,(+)     substance abuse  cocaine use and marijuana use,   Endo/Other  diabetes, Type 2, Insulin DependentMorbid obesity  Renal/GU ESRF and DialysisRenal disease     Musculoskeletal negative musculoskeletal ROS (+)   Abdominal   Peds  Hematology negative hematology ROS (+)   Anesthesia Other Findings   Reproductive/Obstetrics  s/p tubal ligation                            Anesthesia Physical Anesthesia Plan  ASA: 3  Anesthesia Plan: Regional   Post-op Pain Management:    Induction:   PONV Risk Score and Plan: 2 and Propofol infusion and Treatment may vary due to age or medical condition  Airway Management Planned: Natural Airway and Simple Face Mask  Additional Equipment: None  Intra-op Plan:   Post-operative Plan:   Informed  Consent: I have reviewed the patients History and Physical, chart, labs and discussed the procedure including the risks, benefits and alternatives for the proposed anesthesia with the patient or authorized representative who has indicated his/her understanding and acceptance.       Plan Discussed with: CRNA and Anesthesiologist  Anesthesia Plan Comments:       Anesthesia Quick Evaluation

## 2021-02-23 NOTE — Progress Notes (Signed)
Anesthesia Chart Review: Samantha Pierce  Case: [id number] Date/Time: 02/24/21 0949   Procedure: RIGHT UPPER ARM BASILIC VEIN ARTERIOVENOUS (AV) FISTULA CREATION (Right)   Anesthesia type: Regional   Pre-op diagnosis: ESRD   Location: [facility] OR ROOM 12 / [place] OR   Surgeons: Cherre Robins, MD       DISCUSSION: Patient is a 50 year old female scheduled for the above procedure. She is s/p ligation of left BVT AVF 01/04/21 for Steal Syndrome with insertion of right IJ TDC. She needs new permanent HD access. Currently dialyzing via right IJ TDC on MWF.  History includes former smoker (quit 12/04/18), COPD, HTN, non-obstructive CAD (05/2015), CVA (09/04/14), morbid obesity, dyspnea, DM2 (with neuropathy, retinopathy, nephropathy), PTSD, Bipolar disorder, seizure (related to uncontrolled HTN/DM), GERD, gait disturbance, ESRD (HD started ~ 08/20/20), anemia. Positive UDS for cocaine and THC 01/19/19, but no current use documented.   Of note, on 06/01/15 she developed bradycardia followed by asystole during a Lexiscan nuclear stress test requiring brief CODE. Cardiac cath showed only 30% LCX disease, so response felt likely "excessive pharmacologic response to the agent and likely superimposed vasovagal response." Last cardiology evaluation was on 8/09/05/20 by Bernerd Pho, PA-C. No anginal symptoms.  One year follow-up planned.   Per posting, patient is for 23 hour OBS and will need COVID-19 testing on arrival due to transportation issues.    VS:  BP Readings from Last 3 Encounters:  02/22/21 121/70  01/20/21 113/75  01/06/21 (!) 154/60   Pulse Readings from Last 3 Encounters:  02/22/21 71  01/20/21 63  01/06/21 62     PROVIDERS: Rosita Fire, MD is PCP  Mikki Santee, MD is cardiologist [name], NP is endocrinology provider. Last visit 02/22/21. A1c 10.2%. She wrote, "Based on her hyperglycemia overall, she is advised to increase her Levemir to 90 units SQ nightly and continue same  dose of Novolog 10-16 units TID with meals if lgucose is above 90 and she is eating...continue monitoring blood glucose 4 times daily, before meals and before bed, and to call the clinic if she has readings less than 70 or greater than 300 for 3 tests in a row." Bhutani, Manpreet, MBBS is nephrologist   LABS: For day of surgery as indicated. H/H 12.6/37.0 on 01/04/21. A1c 10.2% on 02/22/21.   IMAGES: 1V PCXR 01/04/21 (post TDC placement): FINDINGS: Right internal jugular catheter has its tip in the central to lower right atrium. No pneumothorax. There is cardiomegaly and pulmonary venous hypertension without frank edema.  IMPRESSION: Central line tip in the mid to low right atrium.  No pneumothorax.   EKG: 09/02/20: NSR   CV: Echo 01/20/19: IMPRESSIONS  1. The left ventricle has normal systolic function with an ejection fraction of 60-65%. The cavity size was normal. There is moderately increased left ventricular wall thickness. Left ventricular diastolic Doppler parameters are indeterminate.  2. The right ventricle has normal systolic function. The cavity was normal. There is no increase in right ventricular wall thickness.  3. Left atrial size was severely dilated.  4. Right atrial size was mildly dilated.  5. No evidence of mitral valve stenosis.  6. The aortic valve is tricuspid. No stenosis of the aortic valve.  7. The aorta is normal unless otherwise noted.  8. There is dilatation of the aortic root. (3.10 cm)  9. Pulmonary hypertension is indeterminate, inadequate TR jet. 10. The inferior vena cava was dilated in size with >50% respiratory variability.    Cardiac cath 06/01/15:  Prox Cx lesion, 30% stenosed. The coronary arteries are widely patent with minimal plaque noted. 40 cc of contrast was used Left ventricular end-diastolic pressure is mildly elevated. Left ventriculography was not performed in an attempt to minimize contrast exposure. Asystole during adenosine infusion  represents an excessive pharmacologic response to the agent and likely superimposed vasovagal response.    Carotid US 05/23/15: Summary: Intimal wall thickening CCA. Mild mixed plaque origin ICA. 1-39% ICA plaquing. Vertebral artery flow is antegrade.   Past Medical History:  Diagnosis Date   Anemia    Anxiety    Asystole ([place])    a. 05/2015: pt developed bradycardia->asystole during [place] nuclear stress test, s/p brief code. Cath with only 30% prox LCx. Her asystole during Lexiscan infusion was felt to represent an excessive pharmacologic response to the agent and likely superimposed vasovagal response.   Bipolar disorder ([place])    CKD (chronic kidney disease) stage 4, GFR 15-29 ml/min (HCC)    M_W_F dialysis   COPD (chronic obstructive pulmonary disease) (HCC)    Depression    Essential hypertension    GERD (gastroesophageal reflux disease)    History of blood transfusion    x 2   History of seizure 2017   only one seizure - none since   History of stroke April 2016   Acute lacunar infarct of the left thalamus   Leg cramps    Mild CAD    a. LHC 05/2015: 30% prox Cx, otherwise widely patent.   Morbid obesity ([place])    Neuromuscular disorder ([place])    neuropathy in feet   Neuropathy    feet   PTSD (post-traumatic stress disorder)    Stroke (HCC)    x 2, right side weakness   Type 2 diabetes mellitus ([place])     Past Surgical History:  Procedure Laterality Date   AV FISTULA PLACEMENT Right 01/08/2017   Procedure: RIGHT ARM BRACHIOCEPHALIC ARTERIOVENOUS (AV) FISTULA CREATION;  Surgeon: Waynetta Sandy, MD;  Location: [place];  Service: Vascular;  Laterality: Right;   AV FISTULA PLACEMENT Left 11/20/2018   Procedure: Creation of LEFT ARM ARTERIOVENOUS (AV) FISTULA;  Surgeon: Waynetta Sandy, MD;  Location: [place];  Service: Vascular;  Laterality: Left;   [place] Left 02/11/2019   Procedure: BASILIC VEIN TRANSPOSITION SECOND STAGE;  Surgeon:  Waynetta Sandy, MD;  Location: [place];  Service: Vascular;  Laterality: Left;   CARDIAC CATHETERIZATION N/A 06/01/2015   Procedure: Left Heart Cath and Coronary Angiography;  Surgeon: Belva Crome, MD; CFX 30%, no other dz, EF nl by echo   CESAREAN SECTION     x2   [place]     Right eye pars plano vitrectomy    HEMATOMA EVACUATION Left 03/03/2019   Procedure: EVACUATION HEMATOMA;  Surgeon: Elam Dutch, MD;  Location: [place];  Service: Vascular;  Laterality: Left;   INSERTION OF DIALYSIS CATHETER Right 01/04/2021   Procedure: INSERTION OF RIGHT INTERNAL JUGULAR TUNNELED DIALYSIS CATHETER;  Surgeon: Elam Dutch, MD;  Location: [place];  Service: Vascular;  Laterality: Right;   LASIK     LIGATION OF ARTERIOVENOUS  FISTULA Left 01/04/2021   Procedure: LIGATION OF LEFT ARM ARTERIOVENOUS  FISTULA;  Surgeon: Elam Dutch, MD;  Location: [place];  Service: Vascular;  Laterality: Left;   PARS PLANA VITRECTOMY  04/20/2011   Procedure: PARS PLANA VITRECTOMY WITH 25 GAUGE;  Surgeon: Hayden Pedro, MD;  Location: [place] OR;  Service: Ophthalmology;  Laterality: Left;  membrane peel, gas fluid exchange, endolaser, repair of complex retinal detachment   PATCH ANGIOPLASTY Left 10/05/2020   Procedure: PATCH ANGIOPLASTY USING Rueben Bash BIOLOGIC PATCH;  Surgeon: Elam Dutch, MD;  Location: [place];  Service: Vascular;  Laterality: Left;   PERIPHERAL VASCULAR BALLOON ANGIOPLASTY Left 09/02/2020   Procedure: PERIPHERAL VASCULAR BALLOON ANGIOPLASTY;  Surgeon: Marty Heck, MD;  Location: [place] [facility];  Service: Cardiovascular;  Laterality: Left;  arm fistula   REVISON OF ARTERIOVENOUS FISTULA Left 10/05/2020   Procedure: REVISION OF LEFT ARM ARTERIOVENOUS FISTULA;  Surgeon: Elam Dutch, MD;  Location: [facility] OR;  Service: Vascular;  Laterality: Left;   TUBAL LIGATION      MEDICATIONS: No current facility-administered medications for this  encounter.    albuterol (PROVENTIL HFA;VENTOLIN HFA) 108 (90 BASE) MCG/ACT inhaler   amLODipine (NORVASC) 10 MG tablet   atorvastatin (LIPITOR) 80 MG tablet   Cholecalciferol (VITAMIN D) 50 MCG (2000 UT) CAPS   cloNIDine (CATAPRES) 0.2 MG tablet   FLUoxetine (PROZAC) 10 MG tablet   FLUoxetine (PROZAC) 20 MG capsule   furosemide (LASIX) 80 MG tablet   gabapentin (NEURONTIN) 300 MG capsule   hydrALAZINE (APRESOLINE) 50 MG tablet   insulin aspart (NOVOLOG) 100 UNIT/ML FlexPen   metoprolol succinate (TOPROL-XL) 50 MG 24 hr tablet   OLANZapine (ZYPREXA) 2.5 MG tablet   omeprazole (PRILOSEC) 20 MG capsule   sodium bicarbonate 650 MG tablet   traZODone (DESYREL) 100 MG tablet   Continuous Blood Gluc Receiver (DEXCOM G6 RECEIVER) DEVI   insulin detemir (LEVEMIR) 100 UNIT/ML FlexPen   Lancets (ONETOUCH DELICA PLUS 123XX123) MISC   ONETOUCH VERIO test strip   oxyCODONE-acetaminophen (PERCOCET) 10-325 MG tablet    Myra Gianotti, PA-C Surgical Short Stay/Anesthesiology [facility] Phone [phone] [facility] Phone [phone] 02/23/2021 3:23 PM

## 2021-02-24 ENCOUNTER — Encounter (HOSPITAL_COMMUNITY)
Admission: RE | Disposition: A | Discharge status: Home / Self Care | Payer: Self-pay | Source: Home / Self Care | Attending: Vascular Surgery

## 2021-02-24 ENCOUNTER — Ambulatory Visit (HOSPITAL_COMMUNITY): Payer: Medicare HMO | Admitting: Vascular Surgery

## 2021-02-24 ENCOUNTER — Observation Stay (HOSPITAL_COMMUNITY)
Admission: RE | Admit: 2021-02-24 | Discharge: 2021-02-25 | Disposition: A | Discharge status: Home / Self Care | Payer: Medicare HMO | Attending: Vascular Surgery | Admitting: Vascular Surgery

## 2021-02-24 ENCOUNTER — Encounter (HOSPITAL_COMMUNITY): Payer: Self-pay | Admitting: Vascular Surgery

## 2021-02-24 ENCOUNTER — Other Ambulatory Visit: Payer: Self-pay

## 2021-02-24 DIAGNOSIS — I132 Hypertensive heart and chronic kidney disease with heart failure and with stage 5 chronic kidney disease, or end stage renal disease: Secondary | ICD-10-CM | POA: Diagnosis not present

## 2021-02-24 DIAGNOSIS — I509 Heart failure, unspecified: Secondary | ICD-10-CM | POA: Diagnosis not present

## 2021-02-24 DIAGNOSIS — I5031 Acute diastolic (congestive) heart failure: Secondary | ICD-10-CM | POA: Diagnosis not present

## 2021-02-24 DIAGNOSIS — Z8673 Personal history of transient ischemic attack (TIA), and cerebral infarction without residual deficits: Secondary | ICD-10-CM | POA: Insufficient documentation

## 2021-02-24 DIAGNOSIS — Z20822 Contact with and (suspected) exposure to covid-19: Secondary | ICD-10-CM | POA: Insufficient documentation

## 2021-02-24 DIAGNOSIS — N186 End stage renal disease: Principal | ICD-10-CM | POA: Insufficient documentation

## 2021-02-24 DIAGNOSIS — Z992 Dependence on renal dialysis: Secondary | ICD-10-CM | POA: Diagnosis not present

## 2021-02-24 DIAGNOSIS — F17200 Nicotine dependence, unspecified, uncomplicated: Secondary | ICD-10-CM | POA: Insufficient documentation

## 2021-02-24 DIAGNOSIS — R69 Illness, unspecified: Secondary | ICD-10-CM | POA: Diagnosis not present

## 2021-02-24 DIAGNOSIS — Z794 Long term (current) use of insulin: Secondary | ICD-10-CM | POA: Diagnosis not present

## 2021-02-24 DIAGNOSIS — I2510 Atherosclerotic heart disease of native coronary artery without angina pectoris: Secondary | ICD-10-CM | POA: Insufficient documentation

## 2021-02-24 DIAGNOSIS — E1122 Type 2 diabetes mellitus with diabetic chronic kidney disease: Secondary | ICD-10-CM | POA: Diagnosis not present

## 2021-02-24 DIAGNOSIS — J449 Chronic obstructive pulmonary disease, unspecified: Secondary | ICD-10-CM | POA: Diagnosis not present

## 2021-02-24 HISTORY — PX: AV FISTULA PLACEMENT: SHX1204

## 2021-02-24 LAB — GLUCOSE, CAPILLARY
Glucose-Capillary: 283 mg/dL — ABNORMAL HIGH (ref 70–99)
Glucose-Capillary: 239 mg/dL — ABNORMAL HIGH (ref 70–99)
Glucose-Capillary: 127 mg/dL — ABNORMAL HIGH (ref 70–99)
Glucose-Capillary: 271 mg/dL — ABNORMAL HIGH (ref 70–99)
Glucose-Capillary: 274 mg/dL — ABNORMAL HIGH (ref 70–99)

## 2021-02-24 LAB — POCT I-STAT, CHEM 8
BUN: 18 mg/dL (ref 6–20)
Calcium, Ion: 1.12 mmol/L — ABNORMAL LOW (ref 1.15–1.40)
Chloride: 98 mmol/L (ref 98–111)
Creatinine, Ser: 3.8 mg/dL — ABNORMAL HIGH (ref 0.44–1.00)
Glucose, Bld: 271 mg/dL — ABNORMAL HIGH (ref 70–99)
HCT: 38 % (ref 36.0–46.0)
Hemoglobin: 12.9 g/dL (ref 12.0–15.0)
Potassium: 4 mmol/L (ref 3.5–5.1)
Sodium: 135 mmol/L (ref 135–145)
TCO2: 27 mmol/L (ref 22–32)

## 2021-02-24 LAB — POCT PREGNANCY, URINE: Preg Test, Ur: NEGATIVE

## 2021-02-24 LAB — SARS CORONAVIRUS 2 (TAT 6-24 HRS): SARS Coronavirus 2: NEGATIVE

## 2021-02-24 SURGERY — ARTERIOVENOUS (AV) FISTULA CREATION
Anesthesia: Regional | Site: Arm Upper | Laterality: Right

## 2021-02-24 MED ORDER — SODIUM CHLORIDE 0.9 % IV SOLN: 250.0000 mL | INTRAVENOUS | Status: DC | PRN

## 2021-02-24 MED ORDER — CHLORHEXIDINE GLUCONATE 4 % EX LIQD: 60.0000 mL | Freq: Once | CUTANEOUS | Status: DC

## 2021-02-24 MED ORDER — ONDANSETRON HCL 4 MG/2ML IJ SOLN
INTRAMUSCULAR | Status: AC
  Filled 2021-02-24: qty 2

## 2021-02-24 MED ORDER — INSULIN ASPART 100 UNIT/ML IJ SOLN
5.0000 [IU] | Freq: Once | INTRAMUSCULAR | Status: AC
  Administered 2021-02-24: 5 [IU] via SUBCUTANEOUS
  Filled 2021-02-24: qty 1

## 2021-02-24 MED ORDER — HEPARIN SODIUM (PORCINE) 1000 UNIT/ML IJ SOLN
INTRAMUSCULAR | Status: AC
  Filled 2021-02-24: qty 2

## 2021-02-24 MED ORDER — HYDRALAZINE HCL 50 MG PO TABS
50.0000 mg | ORAL_TABLET | Freq: Two times a day (BID) | ORAL | Status: DC
  Administered 2021-02-24 – 2021-02-25 (×2): 50 mg via ORAL
  Filled 2021-02-24 (×2): qty 1

## 2021-02-24 MED ORDER — CLONIDINE HCL 0.2 MG PO TABS
0.2000 mg | ORAL_TABLET | Freq: Two times a day (BID) | ORAL | Status: DC
  Administered 2021-02-24 – 2021-02-25 (×2): 0.2 mg via ORAL
  Filled 2021-02-24 (×2): qty 1

## 2021-02-24 MED ORDER — OLANZAPINE 2.5 MG PO TABS
2.5000 mg | ORAL_TABLET | Freq: Every day | ORAL | Status: DC
  Administered 2021-02-24: 2.5 mg via ORAL
  Filled 2021-02-24: qty 1

## 2021-02-24 MED ORDER — PROMETHAZINE HCL 25 MG/ML IJ SOLN: 6.2500 mg | INTRAMUSCULAR | Status: DC | PRN

## 2021-02-24 MED ORDER — PHENYLEPHRINE HCL (PRESSORS) 10 MG/ML IV SOLN
INTRAVENOUS | Status: DC | PRN
  Administered 2021-02-24 (×2): 160 ug via INTRAVENOUS

## 2021-02-24 MED ORDER — PROPOFOL 1000 MG/100ML IV EMUL
INTRAVENOUS | Status: AC
  Filled 2021-02-24: qty 100

## 2021-02-24 MED ORDER — LIDOCAINE HCL (CARDIAC) PF 100 MG/5ML IV SOSY
PREFILLED_SYRINGE | INTRAVENOUS | Status: DC | PRN
  Administered 2021-02-24: 20 mg via INTRAVENOUS

## 2021-02-24 MED ORDER — HYDRALAZINE HCL 20 MG/ML IJ SOLN: 5.0000 mg | INTRAMUSCULAR | Status: DC | PRN

## 2021-02-24 MED ORDER — PHENOL 1.4 % MT LIQD: 1.0000 | OROMUCOSAL | Status: DC | PRN

## 2021-02-24 MED ORDER — TRAZODONE HCL 100 MG PO TABS
100.0000 mg | ORAL_TABLET | Freq: Every day | ORAL | Status: DC
  Administered 2021-02-24: 100 mg via ORAL
  Filled 2021-02-24: qty 1

## 2021-02-24 MED ORDER — LACTATED RINGERS IV SOLN: INTRAVENOUS | Status: DC

## 2021-02-24 MED ORDER — ORAL CARE MOUTH RINSE: 15.0000 mL | Freq: Once | OROMUCOSAL | Status: AC

## 2021-02-24 MED ORDER — FENTANYL CITRATE (PF) 100 MCG/2ML IJ SOLN: 25.0000 ug | INTRAMUSCULAR | Status: DC | PRN

## 2021-02-24 MED ORDER — FLUOXETINE HCL 20 MG PO CAPS: 20.0000 mg | ORAL_CAPSULE | ORAL | Status: DC

## 2021-02-24 MED ORDER — LABETALOL HCL 5 MG/ML IV SOLN
10.0000 mg | INTRAVENOUS | Status: DC | PRN
Start: 2021-02-24 — End: 2021-02-25

## 2021-02-24 MED ORDER — HEPARIN 6000 UNIT IRRIGATION SOLUTION
Status: AC
  Filled 2021-02-24: qty 500

## 2021-02-24 MED ORDER — PROPOFOL 10 MG/ML IV BOLUS
INTRAVENOUS | Status: DC | PRN
  Administered 2021-02-24: 15 mg via INTRAVENOUS

## 2021-02-24 MED ORDER — ATORVASTATIN CALCIUM 80 MG PO TABS
80.0000 mg | ORAL_TABLET | Freq: Every morning | ORAL | Status: DC
  Administered 2021-02-24 – 2021-02-25 (×2): 80 mg via ORAL
  Filled 2021-02-24 (×2): qty 1

## 2021-02-24 MED ORDER — ACETAMINOPHEN 650 MG RE SUPP: 325.0000 mg | RECTAL | Status: DC | PRN

## 2021-02-24 MED ORDER — ONDANSETRON HCL 4 MG/2ML IJ SOLN
INTRAMUSCULAR | Status: DC | PRN
  Administered 2021-02-24: 4 mg via INTRAVENOUS

## 2021-02-24 MED ORDER — OXYCODONE-ACETAMINOPHEN 5-325 MG PO TABS
1.0000 | ORAL_TABLET | ORAL | Status: DC | PRN
  Administered 2021-02-24 – 2021-02-25 (×3): 2 via ORAL
  Filled 2021-02-24 (×3): qty 2

## 2021-02-24 MED ORDER — ALUM & MAG HYDROXIDE-SIMETH 200-200-20 MG/5ML PO SUSP: 15.0000 mL | ORAL | Status: DC | PRN

## 2021-02-24 MED ORDER — ACETAMINOPHEN 325 MG PO TABS: 325.0000 mg | ORAL_TABLET | ORAL | Status: DC | PRN

## 2021-02-24 MED ORDER — GUAIFENESIN-DM 100-10 MG/5ML PO SYRP: 15.0000 mL | ORAL_SOLUTION | ORAL | Status: DC | PRN

## 2021-02-24 MED ORDER — SODIUM CHLORIDE 0.9% FLUSH: 3.0000 mL | INTRAVENOUS | Status: DC | PRN

## 2021-02-24 MED ORDER — METOPROLOL SUCCINATE ER 50 MG PO TB24
50.0000 mg | ORAL_TABLET | Freq: Every day | ORAL | Status: DC
  Administered 2021-02-24 – 2021-02-25 (×2): 50 mg via ORAL
  Filled 2021-02-24 (×2): qty 1

## 2021-02-24 MED ORDER — LIDOCAINE HCL (PF) 2 % IJ SOLN
INTRAMUSCULAR | Status: AC
  Filled 2021-02-24: qty 5

## 2021-02-24 MED ORDER — AMLODIPINE BESYLATE 10 MG PO TABS
10.0000 mg | ORAL_TABLET | Freq: Every day | ORAL | Status: DC
  Administered 2021-02-24 – 2021-02-25 (×2): 10 mg via ORAL
  Filled 2021-02-24 (×2): qty 1

## 2021-02-24 MED ORDER — OXYCODONE HCL 5 MG/5ML PO SOLN
5.0000 mg | Freq: Once | ORAL | Status: DC | PRN
Start: 2021-02-24 — End: 2021-02-24

## 2021-02-24 MED ORDER — HEPARIN 6000 UNIT IRRIGATION SOLUTION
Status: DC | PRN
  Administered 2021-02-24: 1

## 2021-02-24 MED ORDER — PANTOPRAZOLE SODIUM 40 MG PO TBEC
40.0000 mg | DELAYED_RELEASE_TABLET | Freq: Every day | ORAL | Status: DC
  Administered 2021-02-24 – 2021-02-25 (×2): 40 mg via ORAL
  Filled 2021-02-24 (×2): qty 1

## 2021-02-24 MED ORDER — MIDAZOLAM HCL 2 MG/2ML IJ SOLN: 2.0000 mg | Freq: Once | INTRAMUSCULAR | Status: AC

## 2021-02-24 MED ORDER — ONDANSETRON HCL 4 MG/2ML IJ SOLN: 4.0000 mg | Freq: Four times a day (QID) | INTRAMUSCULAR | Status: DC | PRN

## 2021-02-24 MED ORDER — 0.9 % SODIUM CHLORIDE (POUR BTL) OPTIME
TOPICAL | Status: DC | PRN
  Administered 2021-02-24: 1000 mL

## 2021-02-24 MED ORDER — SODIUM CHLORIDE 0.9% FLUSH
3.0000 mL | Freq: Two times a day (BID) | INTRAVENOUS | Status: DC
  Administered 2021-02-24 – 2021-02-25 (×3): 3 mL via INTRAVENOUS

## 2021-02-24 MED ORDER — SODIUM CHLORIDE 0.9 % IV SOLN: INTRAVENOUS | Status: DC | PRN

## 2021-02-24 MED ORDER — INSULIN DETEMIR 100 UNIT/ML ~~LOC~~ SOLN
90.0000 [IU] | Freq: Every day | SUBCUTANEOUS | Status: DC
  Administered 2021-02-24: 90 [IU] via SUBCUTANEOUS
  Filled 2021-02-24 (×3): qty 0.9

## 2021-02-24 MED ORDER — GABAPENTIN 300 MG PO CAPS
300.0000 mg | ORAL_CAPSULE | Freq: Every day | ORAL | Status: DC
  Administered 2021-02-24: 300 mg via ORAL
  Filled 2021-02-24: qty 1

## 2021-02-24 MED ORDER — FENTANYL CITRATE (PF) 250 MCG/5ML IJ SOLN
INTRAMUSCULAR | Status: AC
  Filled 2021-02-24: qty 5

## 2021-02-24 MED ORDER — LIDOCAINE-EPINEPHRINE (PF) 1 %-1:200000 IJ SOLN
INTRAMUSCULAR | Status: AC
  Filled 2021-02-24: qty 30

## 2021-02-24 MED ORDER — HYDROMORPHONE HCL 1 MG/ML IJ SOLN
0.5000 mg | INTRAMUSCULAR | Status: AC | PRN
  Filled 2021-02-24: qty 1

## 2021-02-24 MED ORDER — METOPROLOL TARTRATE 5 MG/5ML IV SOLN
2.0000 mg | INTRAVENOUS | Status: DC | PRN
Start: 2021-02-24 — End: 2021-02-25

## 2021-02-24 MED ORDER — PROPOFOL 500 MG/50ML IV EMUL
INTRAVENOUS | Status: DC | PRN
  Administered 2021-02-24: 85 ug/kg/min via INTRAVENOUS

## 2021-02-24 MED ORDER — ALBUTEROL SULFATE (2.5 MG/3ML) 0.083% IN NEBU: 3.0000 mL | INHALATION_SOLUTION | Freq: Four times a day (QID) | RESPIRATORY_TRACT | Status: DC | PRN

## 2021-02-24 MED ORDER — LIDOCAINE-EPINEPHRINE (PF) 1.5 %-1:200000 IJ SOLN
INTRAMUSCULAR | Status: DC | PRN
  Administered 2021-02-24: 25 mL via PERINEURAL

## 2021-02-24 MED ORDER — FUROSEMIDE 40 MG PO TABS
80.0000 mg | ORAL_TABLET | Freq: Two times a day (BID) | ORAL | Status: DC
  Administered 2021-02-25: 80 mg via ORAL
  Filled 2021-02-24: qty 2

## 2021-02-24 MED ORDER — VANCOMYCIN HCL 1500 MG/300ML IV SOLN
1500.0000 mg | INTRAVENOUS | Status: AC
  Administered 2021-02-24: 1500 mg via INTRAVENOUS
  Filled 2021-02-24: qty 300

## 2021-02-24 MED ORDER — SODIUM CHLORIDE 0.9 % IV SOLN: INTRAVENOUS | Status: DC

## 2021-02-24 MED ORDER — FLUOXETINE HCL 10 MG PO CAPS
30.0000 mg | ORAL_CAPSULE | Freq: Every day | ORAL | Status: DC
  Administered 2021-02-24 – 2021-02-25 (×2): 30 mg via ORAL
  Filled 2021-02-24 (×2): qty 3

## 2021-02-24 MED ORDER — INSULIN ASPART 100 UNIT/ML IJ SOLN
0.0000 [IU] | Freq: Three times a day (TID) | INTRAMUSCULAR | Status: DC
  Administered 2021-02-24: 8 [IU] via SUBCUTANEOUS
  Administered 2021-02-25: 3 [IU] via SUBCUTANEOUS

## 2021-02-24 MED ORDER — FENTANYL CITRATE (PF) 100 MCG/2ML IJ SOLN
INTRAMUSCULAR | Status: AC
  Administered 2021-02-24: 50 ug via INTRAVENOUS
  Filled 2021-02-24: qty 2

## 2021-02-24 MED ORDER — FENTANYL CITRATE (PF) 100 MCG/2ML IJ SOLN: 50.0000 ug | Freq: Once | INTRAMUSCULAR | Status: AC

## 2021-02-24 MED ORDER — CHLORHEXIDINE GLUCONATE 0.12 % MT SOLN
15.0000 mL | Freq: Once | OROMUCOSAL | Status: AC
  Administered 2021-02-24: 15 mL via OROMUCOSAL
  Filled 2021-02-24: qty 15

## 2021-02-24 MED ORDER — MIDAZOLAM HCL 2 MG/2ML IJ SOLN
INTRAMUSCULAR | Status: AC
  Administered 2021-02-24: 2 mg via INTRAVENOUS
  Filled 2021-02-24: qty 2

## 2021-02-24 MED ORDER — LIVING WELL WITH DIABETES BOOK
Freq: Once | Status: DC
  Filled 2021-02-24: qty 1

## 2021-02-24 MED ORDER — PHENYLEPHRINE 40 MCG/ML (10ML) SYRINGE FOR IV PUSH (FOR BLOOD PRESSURE SUPPORT)
PREFILLED_SYRINGE | INTRAVENOUS | Status: AC
  Filled 2021-02-24: qty 10

## 2021-02-24 MED ORDER — PROPOFOL 500 MG/50ML IV EMUL
INTRAVENOUS | Status: AC
  Filled 2021-02-24: qty 100

## 2021-02-24 MED ORDER — FENTANYL CITRATE (PF) 100 MCG/2ML IJ SOLN
INTRAMUSCULAR | Status: DC | PRN
  Administered 2021-02-24 (×2): 25 ug via INTRAVENOUS

## 2021-02-24 MED ORDER — OXYCODONE HCL 5 MG PO TABS: 5.0000 mg | ORAL_TABLET | Freq: Once | ORAL | Status: DC | PRN

## 2021-02-24 SURGICAL SUPPLY — 37 items
ARMBAND PINK RESTRICT EXTREMIT (MISCELLANEOUS) ×2 IMPLANT
BENZOIN TINCTURE PRP APPL 2/3 (GAUZE/BANDAGES/DRESSINGS) ×2 IMPLANT
CANISTER SUCT 3000ML PPV (MISCELLANEOUS) ×2 IMPLANT
CANNULA VESSEL 3MM 2 BLNT TIP (CANNULA) ×2 IMPLANT
CHLORAPREP W/TINT 26 (MISCELLANEOUS) ×2 IMPLANT
CLIP VESOCCLUDE MED 6/CT (CLIP) ×2 IMPLANT
CLIP VESOCCLUDE SM WIDE 6/CT (CLIP) ×2 IMPLANT
COVER PROBE W GEL 5X96 (DRAPES) IMPLANT
DERMABOND ADVANCED (GAUZE/BANDAGES/DRESSINGS) ×1
DERMABOND ADVANCED .7 DNX12 (GAUZE/BANDAGES/DRESSINGS) ×1 IMPLANT
ELECT REM PT RETURN 9FT ADLT (ELECTROSURGICAL) ×2
ELECTRODE REM PT RTRN 9FT ADLT (ELECTROSURGICAL) ×1 IMPLANT
GAUZE 4X4 16PLY ~~LOC~~+RFID DBL (SPONGE) ×2 IMPLANT
GLOVE SURG MICRO LTX SZ6 (GLOVE) ×2 IMPLANT
GLOVE SURG POLYISO LF SZ8 (GLOVE) ×2 IMPLANT
GOWN STRL REUS W/ TWL LRG LVL3 (GOWN DISPOSABLE) ×2 IMPLANT
GOWN STRL REUS W/ TWL XL LVL3 (GOWN DISPOSABLE) ×1 IMPLANT
GOWN STRL REUS W/TWL LRG LVL3 (GOWN DISPOSABLE) ×2
GOWN STRL REUS W/TWL XL LVL3 (GOWN DISPOSABLE) ×1
INSERT FOGARTY SM (MISCELLANEOUS) IMPLANT
KIT BASIN OR (CUSTOM PROCEDURE TRAY) ×2 IMPLANT
KIT TURNOVER KIT B (KITS) ×2 IMPLANT
NEEDLE 18GX1X1/2 (RX/OR ONLY) (NEEDLE) IMPLANT
NS IRRIG 1000ML POUR BTL (IV SOLUTION) ×2 IMPLANT
PACK CV ACCESS (CUSTOM PROCEDURE TRAY) ×2 IMPLANT
PAD ARMBOARD 7.5X6 YLW CONV (MISCELLANEOUS) ×4 IMPLANT
SLING ARM FOAM STRAP LRG (SOFTGOODS) ×2 IMPLANT
SPONGE T-LAP 18X18 ~~LOC~~+RFID (SPONGE) ×2 IMPLANT
STRIP CLOSURE SKIN 1/2X4 (GAUZE/BANDAGES/DRESSINGS) ×2 IMPLANT
SUT MNCRL AB 4-0 PS2 18 (SUTURE) ×2 IMPLANT
SUT PROLENE 6 0 BV (SUTURE) ×2 IMPLANT
SUT VIC AB 3-0 SH 27 (SUTURE) ×1
SUT VIC AB 3-0 SH 27X BRD (SUTURE) ×1 IMPLANT
SYR 3ML LL SCALE MARK (SYRINGE) IMPLANT
TOWEL GREEN STERILE (TOWEL DISPOSABLE) ×2 IMPLANT
UNDERPAD 30X36 HEAVY ABSORB (UNDERPADS AND DIAPERS) ×2 IMPLANT
WATER STERILE IRR 1000ML POUR (IV SOLUTION) ×2 IMPLANT

## 2021-02-24 NOTE — Anesthesia Postprocedure Evaluation (Signed)
Anesthesia Post Note  Patient: Samantha Pierce  Procedure(s) Performed: RIGHT UPPER ARM BASILIC VEIN ARTERIOVENOUS (AV) FISTULA CREATION (Right: Arm Upper)     Patient location during evaluation: PACU Anesthesia Type: Regional Level of consciousness: awake and alert Pain management: pain level controlled Vital Signs Assessment: post-procedure vital signs reviewed and stable Respiratory status: spontaneous breathing, nonlabored ventilation and respiratory function stable Cardiovascular status: stable and blood pressure returned to baseline Anesthetic complications: no   No notable events documented.  Last Vitals:  Vitals:   02/24/21 1237 02/24/21 1310  BP: (!) 146/72 (!) 136/105  Pulse: 68 66  Resp: 15 19  Temp: 36.5 C   SpO2: 93% 96%    Last Pain:  Vitals:   02/24/21 1300  TempSrc:   PainSc: 0-No pain                 Audry Pili

## 2021-02-24 NOTE — Progress Notes (Signed)
Contacted by provider with request that pt receive regular out-pt treatment at pt's clinic upon d/c tomorrow. Pt receives out-pt HD at [facility] on MWF at 11:15. Contacted clinic and spoke to Web designer, Gwinda Passe, due to charge RN and Quarry manager not being available today. [place]y aware pt to d/c in the am and complete treatment at clinic on regular day/time. Pt will need to arrive at clinic by 11:00 tomorrow morning. [place], [place], provided this information/update.  Melven Sartorius Renal Navigator [phone]

## 2021-02-24 NOTE — Op Note (Signed)
DATE OF SERVICE: 02/24/2021  PATIENT:  Samantha Pierce  50 y.o. female  PRE-OPERATIVE DIAGNOSIS:  ESRD  POST-OPERATIVE DIAGNOSIS:  Same  PROCEDURE:   Right first stage basilic vein transposition  SURGEON:  Surgeon(s) and Role:    * Cherre Robins, MD - Primary  ASSISTANT: Arlee Muslim, PA-C  An assistant was required to facilitate exposure and expedite the case.  ANESTHESIA:   regional and MAC  EBL: 25m  BLOOD ADMINISTERED:none  DRAINS: none   LOCAL MEDICATIONS USED:  NONE  SPECIMEN:  none  COUNTS: confirmed correct .  TOURNIQUET:  none  PATIENT DISPOSITION:  PACU - hemodynamically stable.   Delay start of Pharmacological VTE agent (>24hrs) due to surgical blood loss or risk of bleeding: no  INDICATION FOR PROCEDURE: Samantha Pierce a 50y.o. female with ESRD in need of permanent dialysis access. After careful discussion of risks, benefits, and alternatives the patient was offered right basilic vein arteriovenous fistula. We specifically discussed risk of steal syndrome. The patient understood and wished to proceed.  OPERATIVE FINDINGS: healthy basilic vein and brachial artery. >3 cm of subcutaneous fat about the distal humerus.  DESCRIPTION OF PROCEDURE: After identification of the patient in the pre-operative holding area, the patient was transferred to the operating room. The patient was positioned supine on the operating room table. Anesthesia was induced. The right arm was prepped and draped in standard fashion. A surgical pause was performed confirming correct patient, procedure, and operative location.  Using intraoperative ultrasound the right brachial artery and basilic vein were mapped.  A curvilinear incision was planned over the course of the two vessels to allow fistula creation.  Incision was created.  Incision was carried down through subcutaneous tissue.  The aponeurosis of the biceps tendon was divided.  The brachial sheath was identified.  The  brachial artery was skeletonized.  The artery was encircled with 2 Silastic Vesseloops.  Next attention was turned to the basilic vein.  This was identified in the medial arm in its typical position.  The vein was mobilized throughout the length of the incision to allow tension-free arteriovenous fistula creation.  The distal end of the vein was clamped with a right angle.  The proximal end of the vein was clamped with a bulldog.  The vein was transected distally.  The stump was oversewn with a 2-0 silk.  The cut end of the vein was spatulated and distended with a mosquito clamp.  Patient was systemically heparinized with 3000 units of IV heparin.  After a three minute pause, the brachial artery was clamped proximally distally.  The basilic vein was anastomosed to the brachial artery into side using continuous running suture of 6-0 Prolene.  Immediately prior to completion the anastomosis was flushed and de-aired.  The anastomosis was completed.  Clamps were released.  Hemostasis was achieved.  An audible bruit was heard in the fistula.  Palpable pulse not was felt in the right wrist, but strong doppler flow was noted.  Stasis was achieved in the surgical bed.  The wound was closed with 3-0 Vicryl and 4-0 Monocryl.   Upon completion of the case instrument and sharps counts were confirmed correct. The patient was transferred to the PACU in good condition. I was present for all portions of the procedure.  TYevonne Aline HStanford Breed MD [facility]Phone Number: [phone]/22/2022 1:01 PM

## 2021-02-24 NOTE — H&P (Signed)
    Established Dialysis Access   History of Present Illness   Samantha Pierce is a 50 y.o. (12/20/1970) female who presents for re-evaluation for permanent access.  The patient is left hand dominant.  Previous access procedures have been completed in the both the right and left upper arm. She previously had first and second stage basilic vein transposition in her left arm.  These were done by Dr. Donzetta Matters.  She developed a hematoma requiring evacuation by Dr. Oneida Alar. She then underwent revision of her AV fistula with a patch angioplasty of the axillary vein portion of the fistula by Dr. Oneida Alar in May of 2022.  Since that time she had increasing numbness tingling and weakness in the left hand which ultimately warranted ligation of her left BV fistula. She additionally has history of a right Cephalic vein fistula which was never used but thrombosed in 2020. She now is in need of a new permanent access.  She is currently dialyzing via a Right IJ TDC on MWF  Current Facility-Administered Medications  Medication Dose Route Frequency Provider Last Rate Last Admin   0.9 %  sodium chloride infusion   Intravenous Continuous Cherre Robins, MD 10 mL/hr at 02/24/21 0759 New Bag at 02/24/21 0759   chlorhexidine (HIBICLENS) 4 % liquid 4 application  60 mL Topical Once Cherre Robins, MD       And   chlorhexidine (HIBICLENS) 4 % liquid 4 application  60 mL Topical Once Cherre Robins, MD       vancomycin (VANCOREADY) IVPB 1500 mg/300 mL  1,500 mg Intravenous 120 min pre-op Cherre Robins, MD 150 mL/hr at 02/24/21 0817 1,500 mg at 02/24/21 0817    On ROS today: negative unless stated in HPI   Physical Examination   Vitals:   02/24/21 0715 02/24/21 0736  BP: (!) 161/85   Pulse: 61 (!) 57  Resp: 18   Temp: 97.7 F (36.5 C)   TempSrc: Oral   SpO2: 100%   Weight: 117.9 kg   Height: '5\' 4"'$  (1.626 m)    Body mass index is 44.63 kg/m.  General Alert, O x 3, Obese, NAD  Pulmonary Sym exp, non  labored   Cardiac Regular rate and rhythm ,   Vascular Vessel Right Left  Radial Palpable Palpable  Brachial Palpable Palpable  Ulnar Faintly palpable Faintly palpable    Musculo- skeletal Left AC incision is healing well. Mild small eschar M/S 5/5 throughout  , Extremities without ischemic changes    Neurologic A&O; CN grossly intact     Non-invasive Vascular Imaging    BUE Doppler (01/20/21):  R arm:  Brachial: tri, 0.49 mm Radial: tri, 0.23 mm Ulnar: tri, 0.24 mm L arm:  Brachial: tri, 0.44 mm Radial: tri, 0.25 mm Ulnar: tri, 0.25 mm  BUE Vein Mapping  (01/1821):  R arm: acceptable vein conduits include right Basilic Vein L arm: acceptable vein conduits include none    Medical Decision Making   Samantha Pierce is a 50 y.o. female who presents with ESRD requiring new permanent hemodialysis access. Plan R first stage basilic vein transposition in OR today.  Yevonne Aline. Stanford Breed, MD [facility] Phone Number: [phone] 02/24/2021 9:30 AM

## 2021-02-24 NOTE — Plan of Care (Signed)

## 2021-02-24 NOTE — Progress Notes (Signed)
MD Fransisco Beau notified about pt's CBG of 283. Pt reports that she ran out of Levemir and didn't realize it, she did not have her dose last night. Pt did take 9 units of her Novolog this morning. Verbal order for 5 units of subcutaneous Novolog received.

## 2021-02-24 NOTE — Anesthesia Procedure Notes (Signed)
Anesthesia Regional Block: Supraclavicular block   Pre-Anesthetic Checklist: , timeout performed,  Correct Patient, Correct Site, Correct Laterality,  Correct Procedure, Correct Position, site marked,  Risks and benefits discussed,  Surgical consent,  Pre-op evaluation,  At surgeon's request and post-op pain management  Laterality: Right  Prep: chloraprep       Needles:  Injection technique: Single-shot  Needle Type: Echogenic Needle     Needle Length: 5cm  Needle Gauge: 21     Additional Needles:   Narrative:  Start time: 02/24/2021 9:54 AM End time: 02/24/2021 9:58 AM Injection made incrementally with aspirations every 5 mL.  Performed by: Personally  Anesthesiologist: Audry Pili, MD  Additional Notes: No pain on injection. No increased resistance to injection. Injection made in 5cc increments. Good needle visualization. Patient tolerated the procedure well.

## 2021-02-24 NOTE — Progress Notes (Signed)
Pt arrived to 4E from PACU. CHG done and pt connected to telemetry. VSS. Call light in reach.  Raelyn Number, RN

## 2021-02-24 NOTE — Transfer of Care (Signed)
Immediate Anesthesia Transfer of Care Note  Patient: Samantha Pierce  Procedure(s) Performed: RIGHT UPPER ARM BASILIC VEIN ARTERIOVENOUS (AV) FISTULA CREATION (Right: Arm Upper)  Patient Location: PACU  Anesthesia Type:MAC and MAC combined with regional for post-op pain  Level of Consciousness: awake, alert  and oriented  Airway & Oxygen Therapy: Patient Spontanous Breathing and Patient connected to nasal cannula oxygen  Post-op Assessment: Report given to RN and Post -op Vital signs reviewed and stable  Post vital signs: Reviewed and stable  Last Vitals:  Vitals Value Taken Time  BP    Temp    Pulse 72 02/24/21 1207  Resp 9 02/24/21 1207  SpO2 97 % 02/24/21 1207  Vitals shown include unvalidated device data.  Last Pain:  Vitals:   02/24/21 1005  TempSrc:   PainSc: 0-No pain         Complications: No notable events documented.

## 2021-02-24 NOTE — Progress Notes (Signed)
Inpatient Diabetes Program Recommendations  AACE/ADA: New Consensus Statement on Inpatient Glycemic Control (2015)  Target Ranges:  Prepandial:   less than 140 mg/dL      Peak postprandial:   less than 180 mg/dL (1-2 hours)      Critically ill patients:  140 - 180 mg/dL   Lab Results  Component Value Date   GLUCAP 127 (H) 02/24/2021   HGBA1C 10.2 (A) 02/22/2021    Review of Glycemic Control Results for KONYA, SCHILD (MRN [id number]) as of 02/24/2021 14:13  Ref. Range 02/24/2021 07:11 02/24/2021 09:13 02/24/2021 12:19  Glucose-Capillary Latest Ref Range: 70 - 99 mg/dL 283 (H) Novolog 5 units 239 (H) 127 (H)   Diabetes history: DM2 Outpatient Diabetes medications: Levemir 90 units q hs, Novolog 10-16 units tid meal coverage Current orders for Inpatient glycemic control: Levemir 90 units hs, Novolog 0-15 units tid with meals correction  Inpatient Diabetes Program Recommendations:   -Consider decrease in Levemir to 45 units and adjust as needed.  Spoke with pt about A1C 10.2 (up from 8.7 10/05/20) and explained what an A1C is, basic pathophysiology of DM Type 2, basic home care, basic diabetes diet nutrition principles, importance of checking CBGs and maintaining good CBG control to prevent long-term and short-term complications. Reviewed signs and symptoms of hyperglycemia and hypoglycemia and how to treat hypoglycemia at home. Also reviewed blood sugar goals at home.  RNs to provide ongoing basic DM education at bedside with this patient. Ordered educational booklet.  Patient states she knows why her A1c is elevated and had been up to 11.0 recently. Pt. Shared she has not had a good appetite and sometimes didn't take insulin when she goes to have dialysis due to not eating a whole meal. Patient agrees to discuss with her MD how to take her insulin on hemodialysis days as compared with non dialysis days. Patient's diabetes is managed by Samantha Pierce in [place]. She recently started using  a Dexcom sensor but hasn't been working so plans to call office and get help on why not working properly.  Thank you, Samantha Pierce. Samantha Moravek, RN, MSN, CDE  Diabetes Coordinator Inpatient Glycemic Control Team Team Pager [phone] (8am-5pm) 02/24/2021 2:21 PM

## 2021-02-25 ENCOUNTER — Encounter (HOSPITAL_COMMUNITY): Payer: Self-pay | Admitting: Vascular Surgery

## 2021-02-25 DIAGNOSIS — Z794 Long term (current) use of insulin: Secondary | ICD-10-CM | POA: Diagnosis not present

## 2021-02-25 DIAGNOSIS — I509 Heart failure, unspecified: Secondary | ICD-10-CM | POA: Diagnosis not present

## 2021-02-25 DIAGNOSIS — J449 Chronic obstructive pulmonary disease, unspecified: Secondary | ICD-10-CM | POA: Diagnosis not present

## 2021-02-25 DIAGNOSIS — I132 Hypertensive heart and chronic kidney disease with heart failure and with stage 5 chronic kidney disease, or end stage renal disease: Secondary | ICD-10-CM | POA: Diagnosis not present

## 2021-02-25 DIAGNOSIS — Z8673 Personal history of transient ischemic attack (TIA), and cerebral infarction without residual deficits: Secondary | ICD-10-CM | POA: Diagnosis not present

## 2021-02-25 DIAGNOSIS — R69 Illness, unspecified: Secondary | ICD-10-CM | POA: Diagnosis not present

## 2021-02-25 DIAGNOSIS — E1122 Type 2 diabetes mellitus with diabetic chronic kidney disease: Secondary | ICD-10-CM | POA: Diagnosis not present

## 2021-02-25 DIAGNOSIS — I2510 Atherosclerotic heart disease of native coronary artery without angina pectoris: Secondary | ICD-10-CM | POA: Diagnosis not present

## 2021-02-25 DIAGNOSIS — Z992 Dependence on renal dialysis: Secondary | ICD-10-CM | POA: Diagnosis not present

## 2021-02-25 DIAGNOSIS — Z20822 Contact with and (suspected) exposure to covid-19: Secondary | ICD-10-CM | POA: Diagnosis not present

## 2021-02-25 DIAGNOSIS — N186 End stage renal disease: Secondary | ICD-10-CM | POA: Diagnosis not present

## 2021-02-25 LAB — GLUCOSE, CAPILLARY: Glucose-Capillary: 176 mg/dL — ABNORMAL HIGH (ref 70–99)

## 2021-02-25 LAB — CBC
HCT: 41.2 % (ref 36.0–46.0)
Hemoglobin: 12.9 g/dL (ref 12.0–15.0)
MCH: 26 pg (ref 26.0–34.0)
MCHC: 31.3 g/dL (ref 30.0–36.0)
MCV: 83.1 fL (ref 80.0–100.0)
Platelets: 171 10*3/uL (ref 150–400)
RBC: 4.96 MIL/uL (ref 3.87–5.11)
RDW: 13.9 % (ref 11.5–15.5)
WBC: 6.6 10*3/uL (ref 4.0–10.5)
nRBC: 0 % (ref 0.0–0.2)

## 2021-02-25 LAB — BASIC METABOLIC PANEL
Anion gap: 11 (ref 5–15)
BUN: 25 mg/dL — ABNORMAL HIGH (ref 6–20)
CO2: 24 mmol/L (ref 22–32)
Calcium: 9 mg/dL (ref 8.9–10.3)
Chloride: 99 mmol/L (ref 98–111)
Creatinine, Ser: 4.84 mg/dL — ABNORMAL HIGH (ref 0.44–1.00)
GFR, Estimated: 10 mL/min — ABNORMAL LOW (ref >60)
Glucose, Bld: 167 mg/dL — ABNORMAL HIGH (ref 70–99)
Potassium: 4.2 mmol/L (ref 3.5–5.1)
Sodium: 134 mmol/L — ABNORMAL LOW (ref 135–145)

## 2021-02-25 MED ORDER — OXYCODONE-ACETAMINOPHEN 5-325 MG PO TABS: 1.0000 | ORAL_TABLET | Freq: Four times a day (QID) | ORAL | 0 refills | Status: DC | PRN

## 2021-02-25 NOTE — Plan of Care (Signed)
  Problem: Education: Goal: Knowledge of General Education information will improve Description: Including pain rating scale, medication(s)/side effects and non-pharmacologic comfort measures Outcome: Adequate for Discharge   

## 2021-02-25 NOTE — Progress Notes (Signed)
  Progress Note    02/25/2021 7:29 AM 1 Day Post-Op  Subjective:  soreness R arm near incision.  Denies R hand pain, numbness   Vitals:   02/24/21 2356 02/25/21 0353  BP:  115/64  Pulse:  60  Resp: 14 10  Temp:  98.5 F (36.9 C)  SpO2:  94%   Physical Exam: Cardiac:  RRR Lungs:  non labored Incisions:  R AC fossa c/d/i Extremities:  palpable R radial pulse; thrill at anastomosis Neurologic: A&O  CBC    Component Value Date/Time   WBC 11.4 (H) 10/06/2020 0137   RBC 3.69 (L) 10/06/2020 0137   HGB 12.9 02/24/2021 0806   HCT 38.0 02/24/2021 0806   PLT 226 10/06/2020 0137   MCV 83.5 10/06/2020 0137   MCH 26.0 10/06/2020 0137   MCHC 31.2 10/06/2020 0137   RDW 13.4 10/06/2020 0137   LYMPHSABS 0.8 03/02/2019 2252   MONOABS 1.8 (H) 03/02/2019 2252   EOSABS 0.1 03/02/2019 2252   BASOSABS 0.1 03/02/2019 2252    BMET    Component Value Date/Time   NA 135 02/24/2021 0806   K 4.0 02/24/2021 0806   CL 98 02/24/2021 0806   CO2 22 10/06/2020 0137   GLUCOSE 271 (H) 02/24/2021 0806   BUN 18 02/24/2021 0806   CREATININE 3.80 (H) 02/24/2021 0806   CALCIUM 8.2 (L) 10/06/2020 0137   GFRNONAA 8 (L) 10/06/2020 0137   GFRAA 12 (L) 03/04/2019 0616    INR    Component Value Date/Time   INR 0.97 06/01/2015 1255     Intake/Output Summary (Last 24 hours) at 02/25/2021 0729 Last data filed at 02/24/2021 1500 Gross per 24 hour  Intake 520 ml  Output 50 ml  Net 470 ml     Assessment/Plan:  50 y.o. female is s/p R basilic vein fistula 1 Day Post-Op   R hand well perfused with palpable radial pulse; soft thrill at fistula anastomosis Discharge home this morning to make 11:15a chair time Fistula duplex in 4-6 weeks to see if ready for 2nd stage transposition   Dagoberto Ligas, PA-C [facility] [phone] 02/25/2021 7:29 AM

## 2021-02-25 NOTE — Progress Notes (Signed)
Ask to verify Hemodialysis catheter is flushed and capped for home per Shanon Brow, RN. Verified with Dr. Jonnie Finner that catheter was tunneled. HD ports are clamped and tapped. Site and dressing are clean, dry, and intact.

## 2021-02-25 NOTE — Discharge Instructions (Signed)
° °  [facility] ° °Discharge Instructions ° °AV Fistula or Graft Surgery for Dialysis Access ° °Please refer to the following instructions for your post-procedure care. Your surgeon or physician assistant will discuss any changes with you. ° °Activity ° °You may drive the day following your surgery, if you are comfortable and no longer taking prescription pain medication. Resume full activity as the soreness in your incision resolves. ° °Bathing/Showering ° °You may shower after you go home. Keep your incision dry for 48 hours. Do not soak in a bathtub, hot tub, or swim until the incision heals completely. You may not shower if you have a hemodialysis catheter. ° °Incision Care ° °Clean your incision with mild soap and water after 48 hours. Pat the area dry with a clean towel. You do not need a bandage unless otherwise instructed. Do not apply any ointments or creams to your incision. You may have skin glue on your incision. Do not peel it off. It will come off on its own in about one week. Your arm may swell a bit after surgery. To reduce swelling use pillows to elevate your arm so it is above your heart. Your doctor will tell you if you need to lightly wrap your arm with an ACE bandage. ° °Diet ° °Resume your normal diet. There are not special food restrictions following this procedure. In order to heal from your surgery, it is CRITICAL to get adequate nutrition. Your body requires vitamins, minerals, and protein. Vegetables are the best source of vitamins and minerals. Vegetables also provide the perfect balance of protein. Processed food has little nutritional value, so try to avoid this. ° °Medications ° °Resume taking all of your medications. If your incision is causing pain, you may take over-the counter pain relievers such as acetaminophen (Tylenol). If you were prescribed a stronger pain medication, please be aware these medications can cause nausea and constipation. Prevent  nausea by taking the medication with a snack or meal. Avoid constipation by drinking plenty of fluids and eating foods with high amount of fiber, such as fruits, vegetables, and grains. Do not take Tylenol if you are taking prescription pain medications. ° ° ° ° °Follow up °Your surgeon may want to see you in the office following your access surgery. If so, this will be arranged at the time of your surgery. ° °Please call us immediately for any of the following conditions: ° °Increased pain, redness, drainage (pus) from your incision site °Fever of 101 degrees or higher °Severe or worsening pain at your incision site °Hand pain or numbness. ° °Reduce your risk of vascular disease: ° °Stop smoking. If you would like help, call [facility] at 1-800-QUIT-NOW (1-800-784-8669) or [place] at [phone] ° °Manage your cholesterol °Maintain a desired weight °Control your diabetes °Keep your blood pressure down ° °Dialysis ° °It will take several weeks to several months for your new dialysis access to be ready for use. Your surgeon will determine when it is OK to use it. Your nephrologist will continue to direct your dialysis. You can continue to use your Permcath until your new access is ready for use. ° °If you have any questions, please call the office at [phone]. ° °

## 2021-02-25 NOTE — Progress Notes (Signed)
Discharge instructions (including medications) discussed with and copy provided to patient/caregiver 

## 2021-02-28 DIAGNOSIS — Z992 Dependence on renal dialysis: Secondary | ICD-10-CM | POA: Diagnosis not present

## 2021-02-28 DIAGNOSIS — N186 End stage renal disease: Secondary | ICD-10-CM | POA: Diagnosis not present

## 2021-02-28 NOTE — Discharge Summary (Signed)
Discharge Summary  Patient ID: Samantha Pierce [id number] 50 y.o. June 24, 1970  Admit date: 02/24/2021  Discharge date and time: 02/25/2021 10:06 AM   Admitting Physician: Cherre Robins, MD   Discharge Physician: same  Admission Diagnoses: ESRD (end stage renal disease) ([place]) [N18.6]  Discharge Diagnoses: same  Admission Condition: good  Discharged Condition: good  Indication for Admission: observation  Hospital Course: Ms. Samantha Pierce is a 50 year old female who underwent right first stage basilic vein fistula creation by Dr. Stanford Breed on 02/24/2021.  She was kept in observation overnight.  POD #1 she was discharged home in time to make her outpatient dialysis appointment.  She will follow-up in 4 to 6 weeks with a fistula duplex to see if fistula is mature enough for second stage transposition.  She was discharged with narcotic pain medication for continued postoperative pain control.  She was discharged in stable condition.  Consults: None  Treatments: surgery: Right first stage basilic vein transposition by Dr. Stanford Breed  Discharge Exam: See progress note 9/23 Vitals:   02/25/21 0353 02/25/21 0845  BP: 115/64 119/67  Pulse: 60 (!) 58  Resp: 10 13  Temp: 98.5 F (36.9 C) 98.2 F (36.8 C)  SpO2: 94% 95%     Disposition: Discharge disposition: 01-Home or Self Care       Patient Instructions:  Allergies as of 02/25/2021       Reactions   Contrast Media [iodinated Diagnostic Agents] Anaphylaxis   "code blue--I died"   Penicillins Rash, Other (See Comments)   Reaction: Childhood        Medication List     STOP taking these medications    oxyCODONE-acetaminophen 10-325 MG tablet Commonly known as: Percocet Replaced by: oxyCODONE-acetaminophen 5-325 MG tablet       TAKE these medications    albuterol 108 (90 Base) MCG/ACT inhaler Commonly known as: VENTOLIN HFA Inhale 1-2 puffs into the lungs every 6 (six) hours as needed for wheezing or  shortness of breath.   amLODipine 10 MG tablet Commonly known as: NORVASC Take 1 tablet (10 mg total) by mouth daily.   atorvastatin 80 MG tablet Commonly known as: LIPITOR Take 1 tablet (80 mg total) by mouth daily at 6 PM. What changed: when to take this   cloNIDine 0.2 MG tablet Commonly known as: CATAPRES Take 0.2 mg by mouth 2 (two) times daily.   Dexcom G6 Receiver Devi USE TO CHECK GLUCOSE FOUR TIMES DAILY AS DIRECTED.   FLUoxetine 10 MG tablet Commonly known as: PROZAC Take 10 mg by mouth See admin instructions. Take with 20 mg for a total of 30 mg daily   FLUoxetine 20 MG capsule Commonly known as: PROZAC Take 20 mg by mouth See admin instructions. Take with 10 mg for a total of 30 mg daily   furosemide 80 MG tablet Commonly known as: LASIX Take 80 mg by mouth 2 (two) times daily with breakfast and lunch.   gabapentin 300 MG capsule Commonly known as: NEURONTIN Take 300 mg by mouth at bedtime.   hydrALAZINE 50 MG tablet Commonly known as: APRESOLINE Take 50 mg by mouth 2 (two) times daily.   insulin aspart 100 UNIT/ML FlexPen Commonly known as: NOVOLOG Inject 10-16 Units into the skin 3 (three) times daily before meals. Sliding scale   insulin detemir 100 UNIT/ML FlexPen Commonly known as: LEVEMIR Inject 90 Units into the skin at bedtime.   metoprolol succinate 50 MG 24 hr tablet Commonly known as: TOPROL-XL Take 50 mg by  mouth daily.   OLANZapine 2.5 MG tablet Commonly known as: ZYPREXA Take 2.5 mg by mouth at bedtime.   omeprazole 20 MG capsule Commonly known as: PRILOSEC Take 20 mg by mouth daily before breakfast.   OneTouch Delica Plus 0000000 Misc Apply topically.   OneTouch Verio test strip Generic drug: glucose blood SMARTSIG:Via Meter   oxyCODONE-acetaminophen 5-325 MG tablet Commonly known as: PERCOCET/ROXICET Take 1-2 tablets by mouth every 6 (six) hours as needed for moderate pain. Replaces: oxyCODONE-acetaminophen 10-325 MG  tablet   sodium bicarbonate 650 MG tablet Take 1,950 mg by mouth daily.   traZODone 100 MG tablet Commonly known as: DESYREL Take 100 mg by mouth at bedtime.   Vitamin D 50 MCG (2000 UT) Caps Take 2,000 Units by mouth daily.       Activity: activity as tolerated Diet: renal diet Wound Care: keep wound clean and dry  Follow-up with [facility] in 5 weeks.  Signed: Dagoberto Ligas, PA-C 02/28/2021 8:01 AM [facility] Office: [phone]

## 2021-03-02 DIAGNOSIS — Z992 Dependence on renal dialysis: Secondary | ICD-10-CM | POA: Diagnosis not present

## 2021-03-02 DIAGNOSIS — N186 End stage renal disease: Secondary | ICD-10-CM | POA: Diagnosis not present

## 2021-03-03 ENCOUNTER — Telehealth: Payer: Self-pay

## 2021-03-03 NOTE — Telephone Encounter (Signed)
Patient calls today to report that her right upper extremity has been cold and numb for about 3-4 days - she is s/p R AVF on 9/22. She has a history of ligation in the past and is currently dialyzing via [facility] on MWF. She says she is not in pain, just has numbness. She is able to grip things. Offered patient appt on Tuesday (no studies) - she will call back to set up appt once she has her transportation arranged.

## 2021-03-04 DIAGNOSIS — N186 End stage renal disease: Secondary | ICD-10-CM | POA: Diagnosis not present

## 2021-03-04 DIAGNOSIS — Z992 Dependence on renal dialysis: Secondary | ICD-10-CM | POA: Diagnosis not present

## 2021-03-07 DIAGNOSIS — Z992 Dependence on renal dialysis: Secondary | ICD-10-CM | POA: Diagnosis not present

## 2021-03-07 DIAGNOSIS — Z23 Encounter for immunization: Secondary | ICD-10-CM | POA: Diagnosis not present

## 2021-03-07 DIAGNOSIS — N186 End stage renal disease: Secondary | ICD-10-CM | POA: Diagnosis not present

## 2021-03-08 ENCOUNTER — Ambulatory Visit (INDEPENDENT_AMBULATORY_CARE_PROVIDER_SITE_OTHER): Payer: Self-pay | Admitting: Physician Assistant

## 2021-03-08 ENCOUNTER — Other Ambulatory Visit: Payer: Self-pay

## 2021-03-08 VITALS — BP 142/77 | HR 64 | Temp 97.7°F | Resp 20 | Ht 64.0 in | Wt 264.7 lb

## 2021-03-08 DIAGNOSIS — N186 End stage renal disease: Secondary | ICD-10-CM

## 2021-03-08 DIAGNOSIS — Z992 Dependence on renal dialysis: Secondary | ICD-10-CM

## 2021-03-08 IMAGING — DX CHEST - 2 VIEW
2 series · 2 of 2 positions shown · non-contrast
Comparison: 08/07/2016

CLINICAL DATA: Shortness of breath, productive cough, and
left-sided chest pain for 1 week. COPD.

EXAM:
CHEST - 2 VIEW

[chest pa]
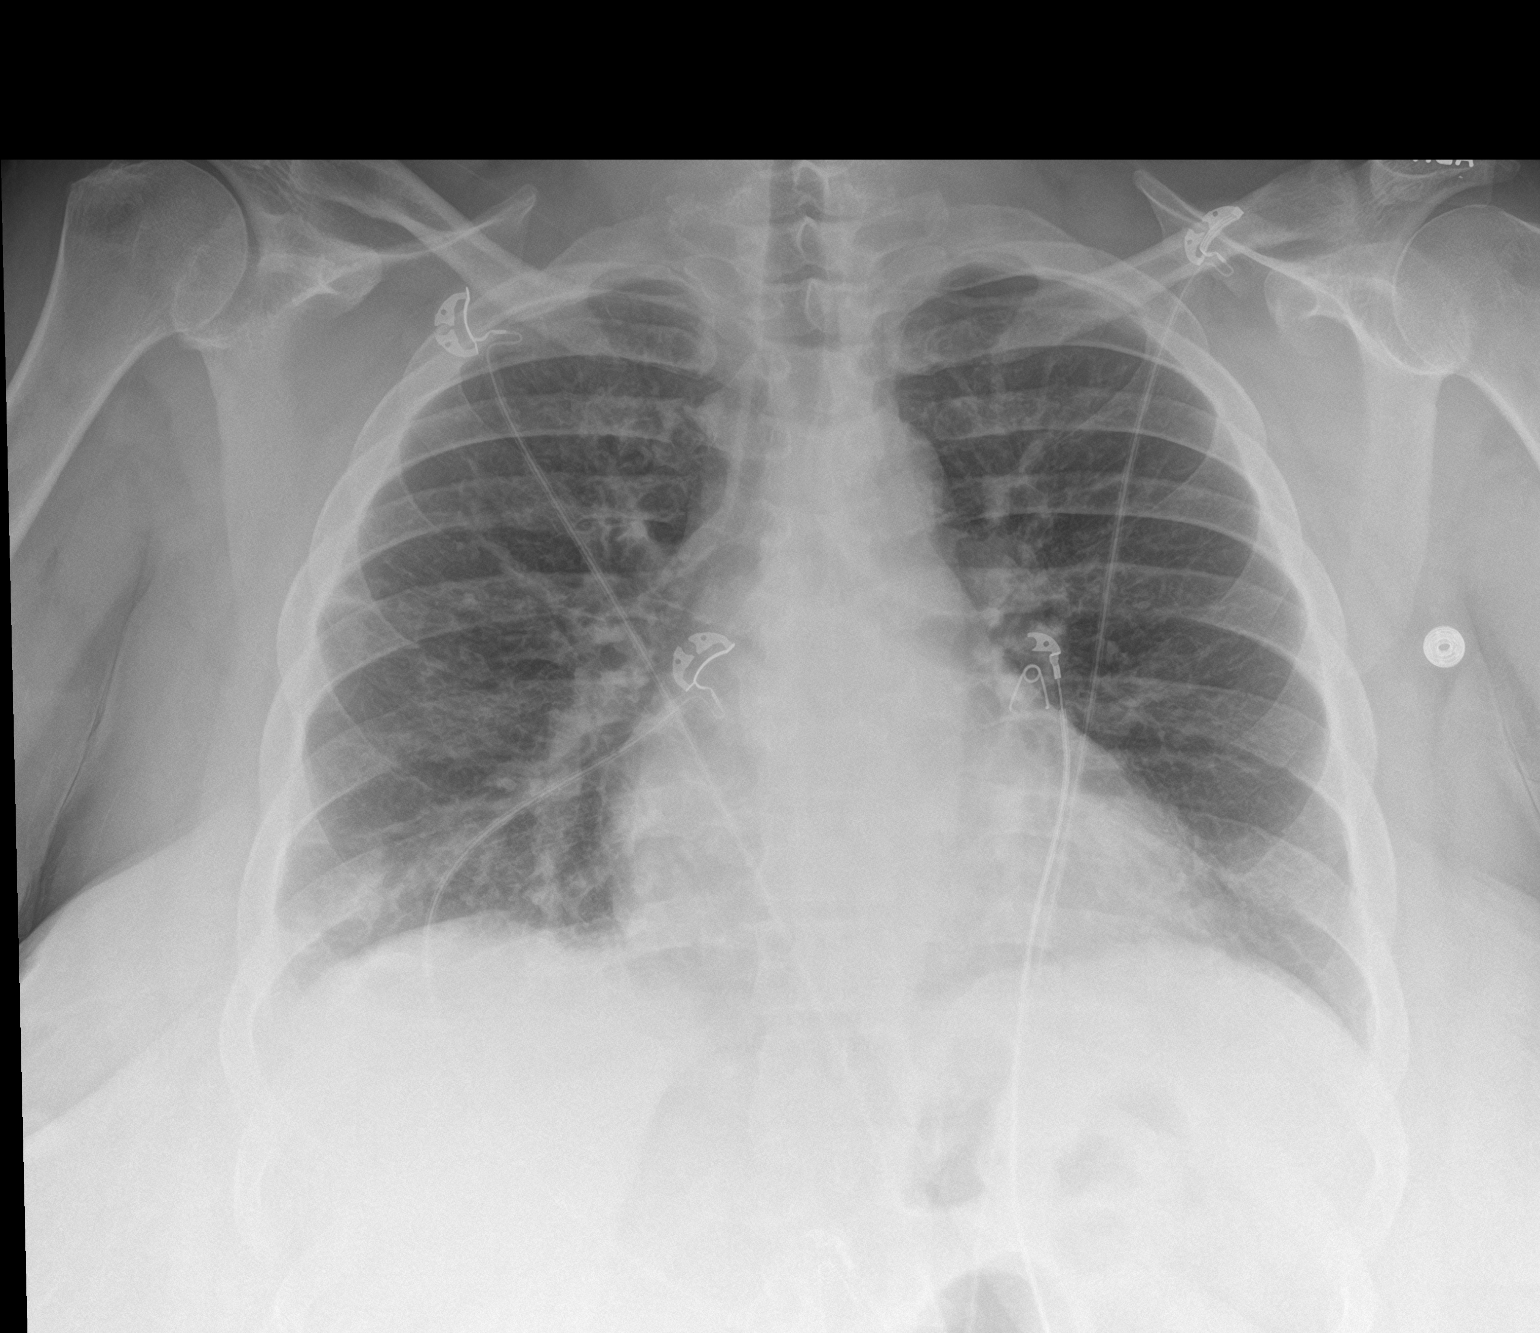

[chest lat]
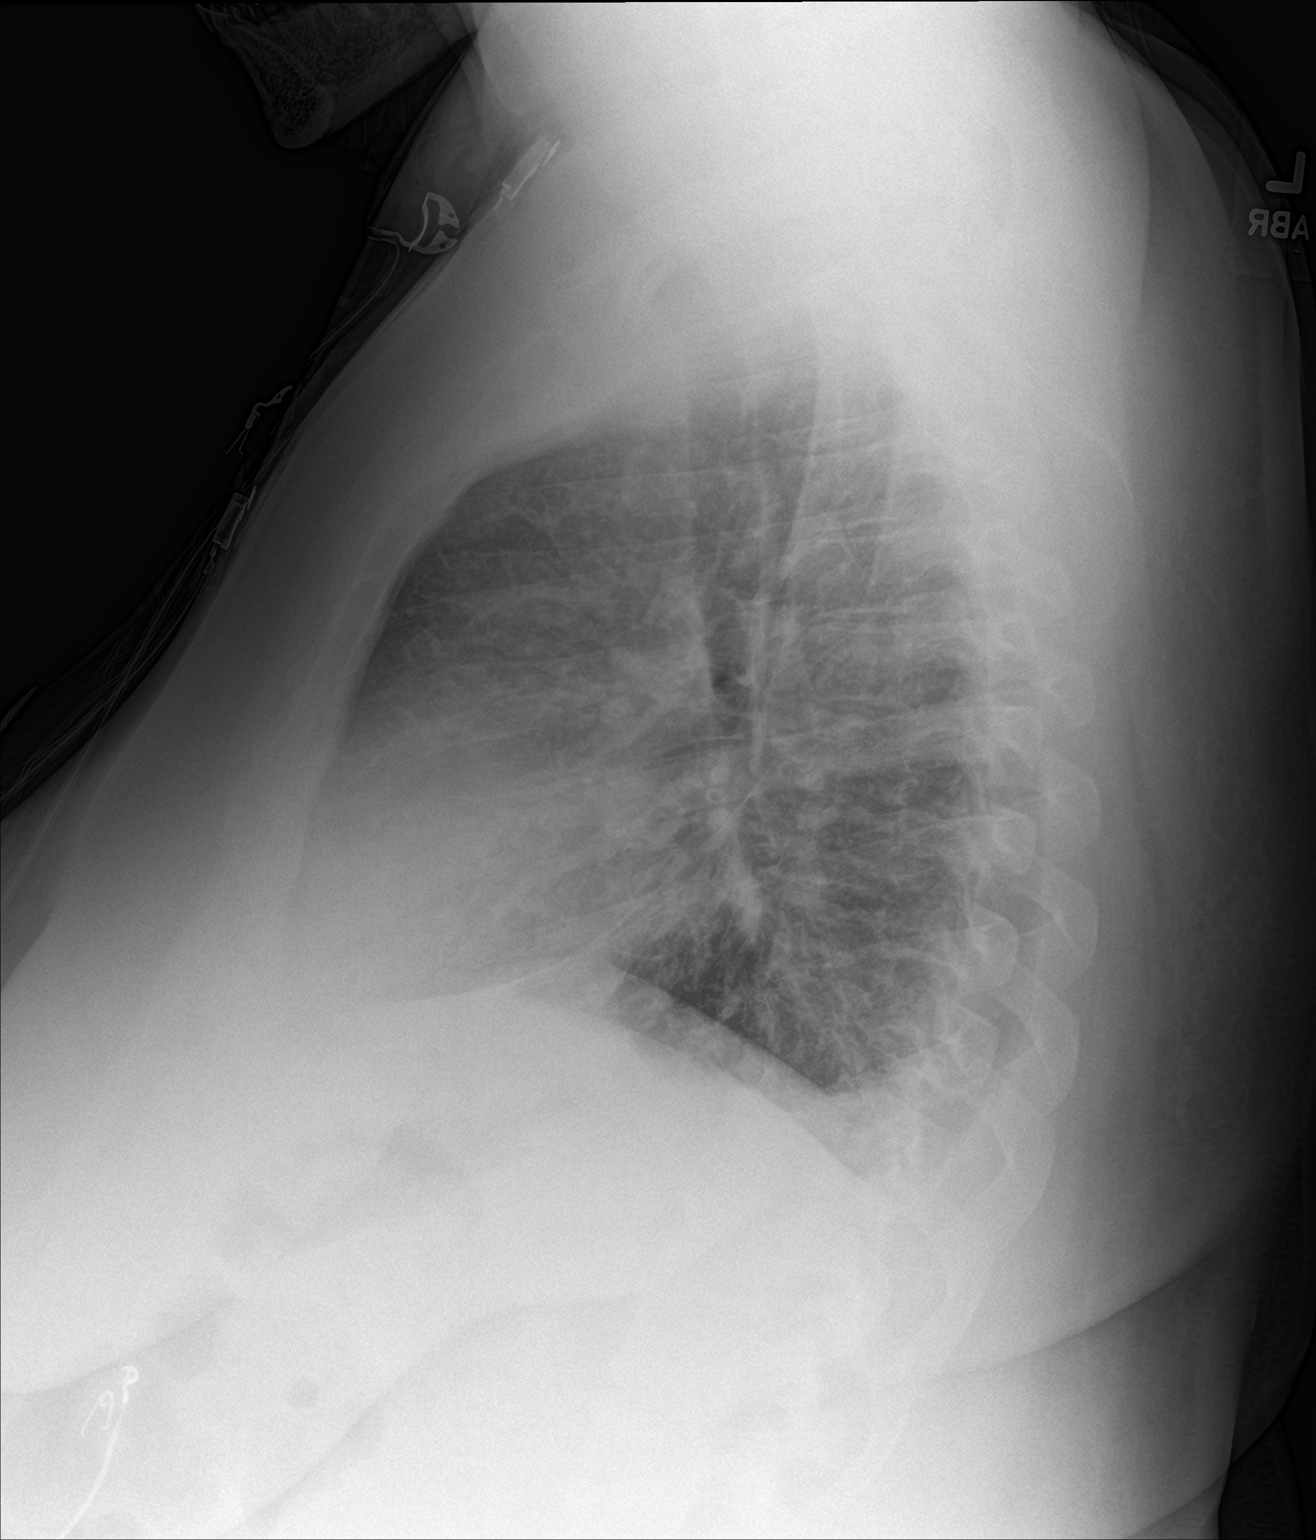

[2 of 2 positions shown; findings below may reference images not displayed]

FINDINGS: The heart size and mediastinal contours are within normal limits.
New patchy opacity is seen in the lateral right lung base, which
could be due to atelectasis or pneumonia. Left lung is clear. No
evidence of pleural effusion.
IMPRESSION: New mild opacity in lateral right lung base, which may be due to
atelectasis or pneumonia. Continued radiographic follow-up is
recommended.

## 2021-03-08 IMAGING — CT CT CHEST WITHOUT CONTRAST
2 of 4 series · 15 of 36 positions shown, 18 images · non-contrast
Comparison: Chest CT angiogram dated 04/12/2015.

CLINICAL DATA: Shortness of breath for 1 week. History of COPD.

EXAM:
CT CHEST WITHOUT CONTRAST
TECHNIQUE: Multidetector CT imaging of the chest was performed following the
standard protocol without IV contrast.

[Series 2: routine chest without · axial · non-contrast · 0.68mm/px · z∈[-319,-71]mm · 12 of 148 slices shown, 15 images]
[im 12/148  mediastinal]
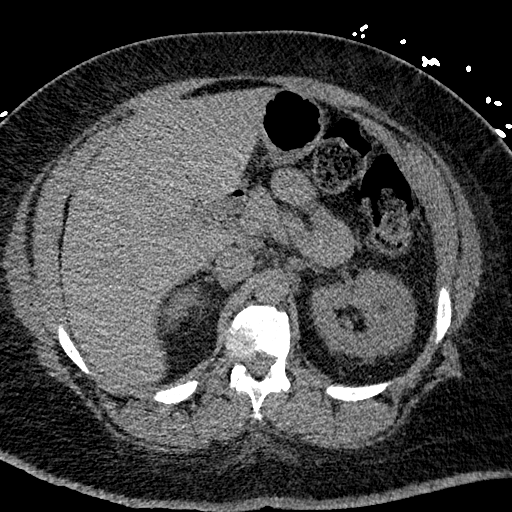
[im 12/148  lung]
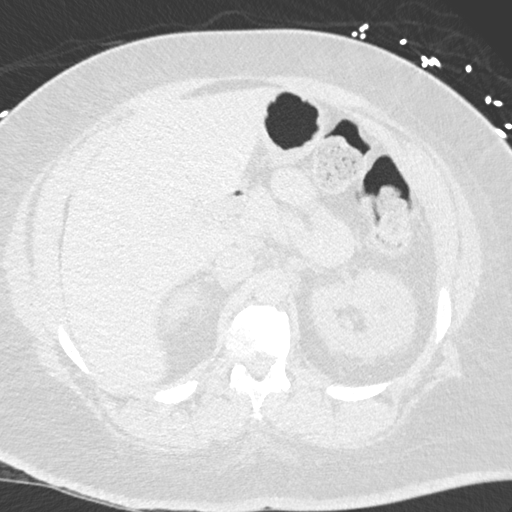
[im 23/148  lung]
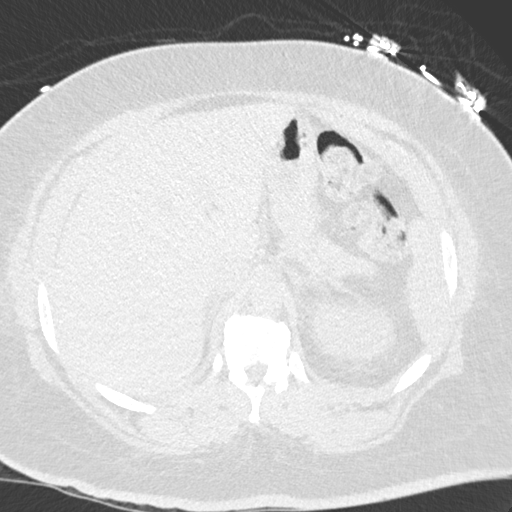
[im 34/148  lung]
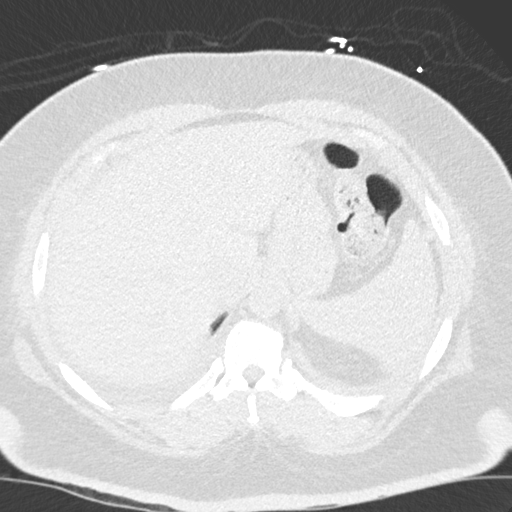
[im 46/148  lung]
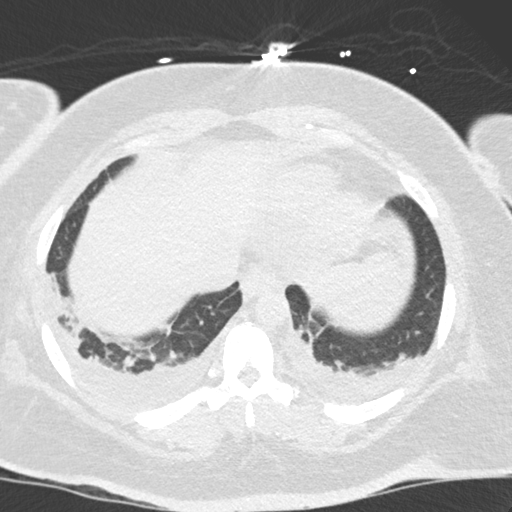
[im 57/148  mediastinal]
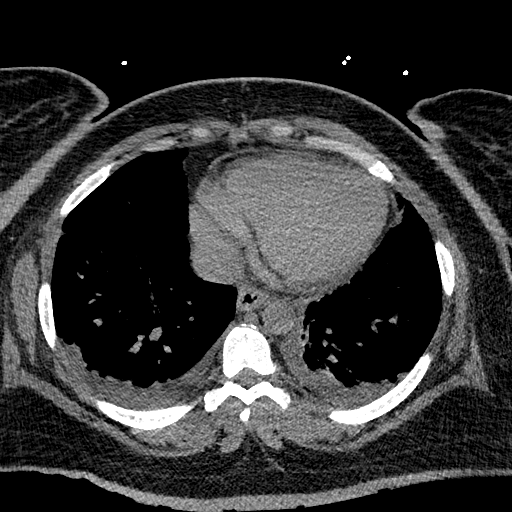
[im 57/148  lung]
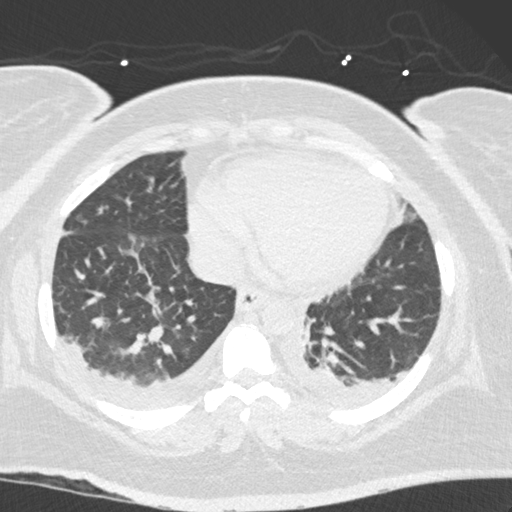
[im 68/148  lung]
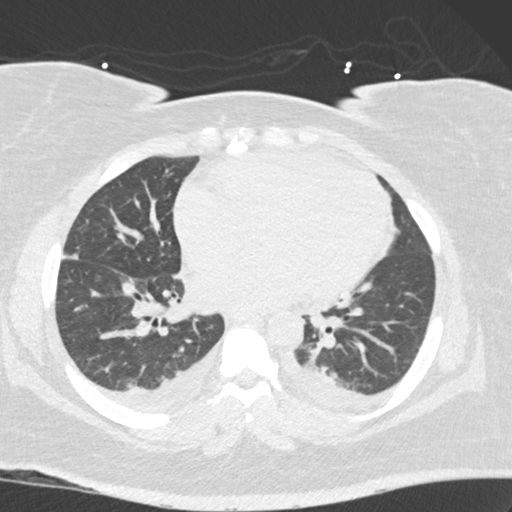
[im 80/148  lung]
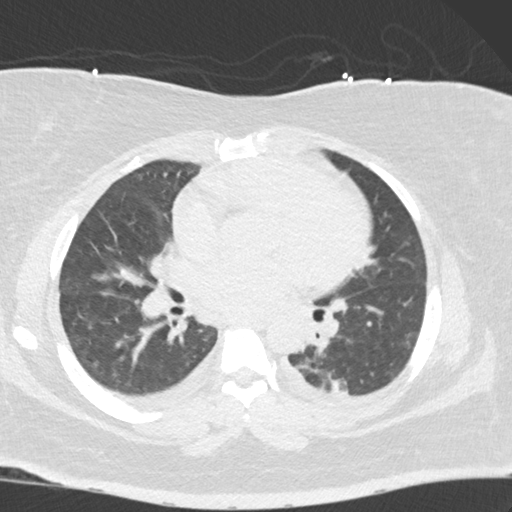
[im 91/148  lung]
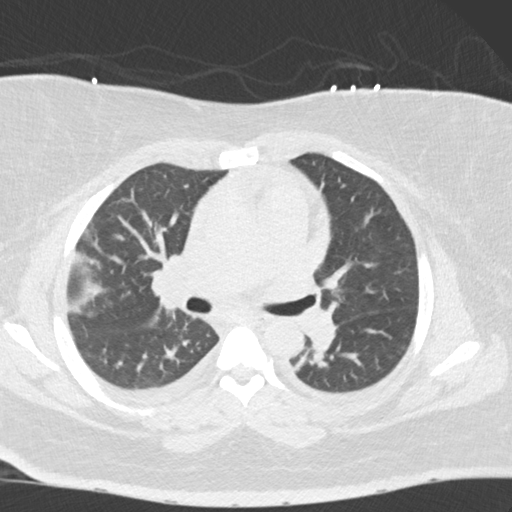
[im 102/148  mediastinal]
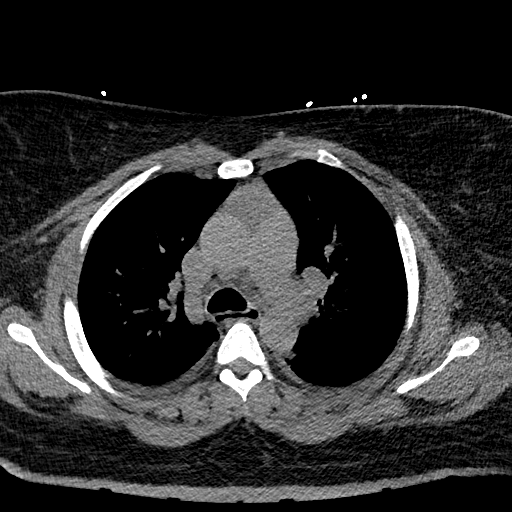
[im 102/148  lung]
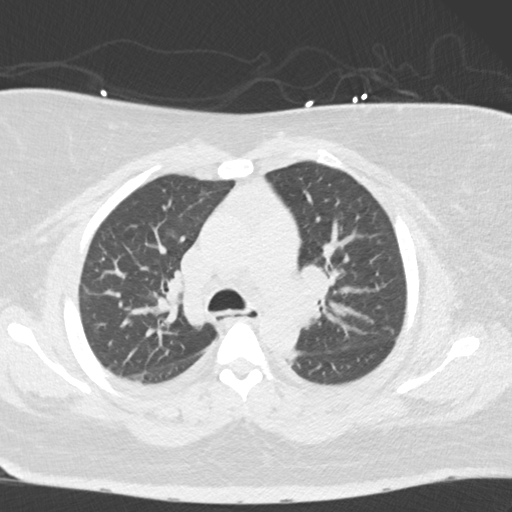
[im 114/148  lung]
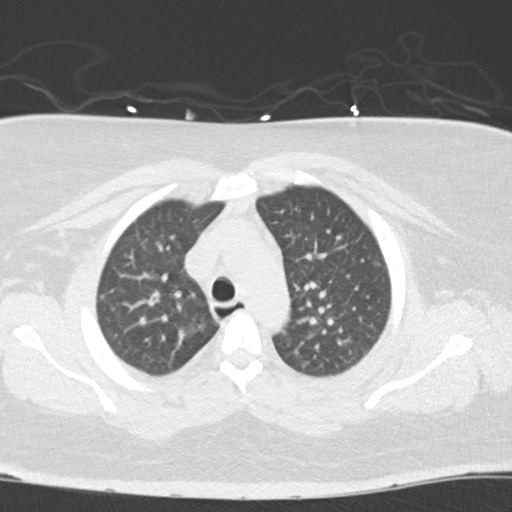
[im 125/148  lung]
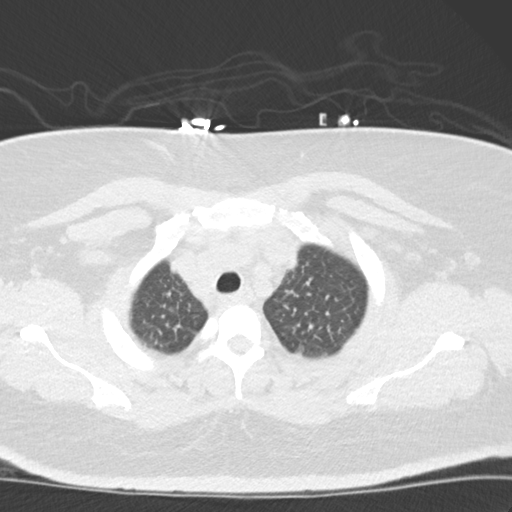
[im 136/148  lung]
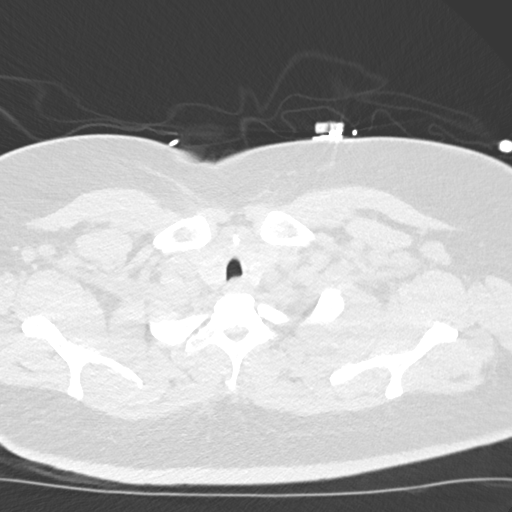

[Series 5: coronal · coronal · 0.59mm/px · 3 of 151 slices shown]
[im 31/151  lung]
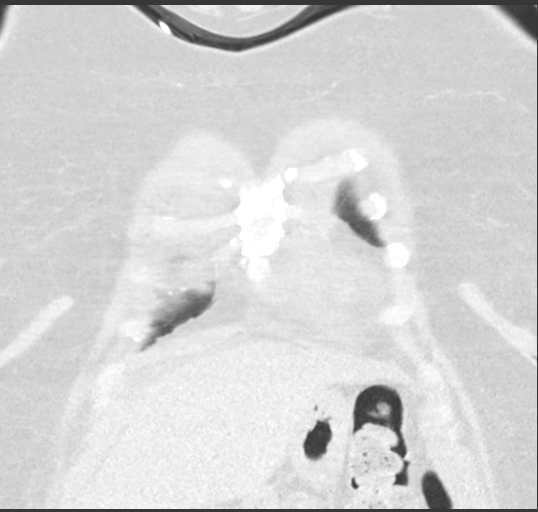
[im 61/151  lung]
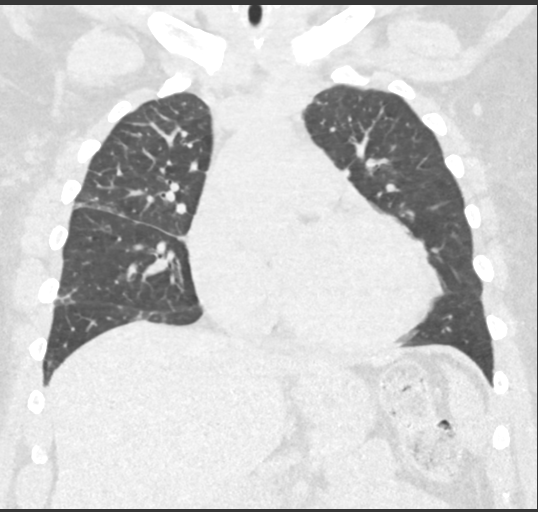
[im 91/151  lung]
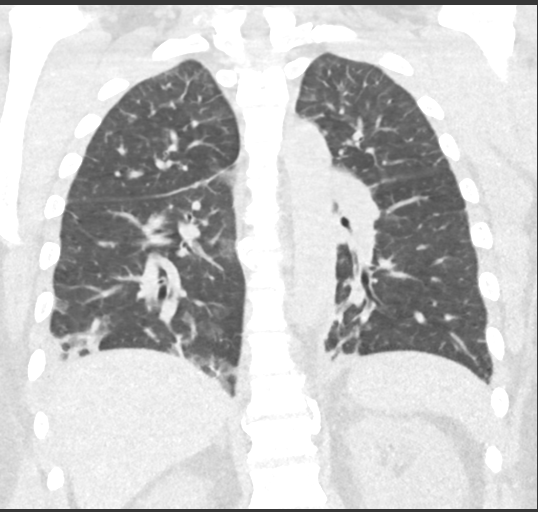

[15 of 36 positions shown; findings below may reference images not displayed]

FINDINGS: Cardiovascular: Cardiomegaly. No significant pericardial effusion.
No thoracic aortic aneurysm. The

Mediastinum/Nodes: No mass or enlarged lymph nodes seen within the
mediastinum. Esophagus is unremarkable. Trachea and central bronchi
are unremarkable.

Lungs/Pleura: Small bilateral pleural effusions with adjacent
compressive atelectasis. Probable additional atelectasis at the
inferior-lateral aspect of the RIGHT upper lobe. Diffuse
interstitial thickening, bibasilar predominant, presumably
interstitial edema. No additional consolidation or ground-glass
opacity to suggest pneumonia. No pneumothorax.

Upper Abdomen: Limited images of the upper abdomen are unremarkable.

Musculoskeletal: No acute or suspicious osseous finding. Mild
degenerative spondylosis at the thoracolumbar junction.
IMPRESSION: 1. Cardiomegaly.
2. Small bilateral pleural effusions with adjacent compressive
atelectasis.
3. Diffuse interstitial thickening, bibasilar predominant,
presumably interstitial edema related to CHF/volume overload. In the
absence of fever, atypical pneumonia is considered less likely.
4. No additional consolidation or ground-glass opacity to suggest
pneumonia.

## 2021-03-08 MED ORDER — GABAPENTIN 300 MG PO CAPS: 300.0000 mg | ORAL_CAPSULE | Freq: Every day | ORAL | 2 refills | Status: DC

## 2021-03-08 NOTE — Progress Notes (Signed)
POST OPERATIVE OFFICE NOTE    CC:  F/u for surgery  HPI:  This is a 50 y.o. female who is here with a chief complaint of numbness on the right volar forearm.  She wanted to be examined due to her history of left arm access problems and steal symptoms.  She is S/P left AV fistula ligation due to ischemic steal on 01/04/21 by Dr. Oneida Alar.  S/P right UE first stage Basilic fistula by Dr. Stanford Breed on 02/24/21.  She states she is worried about the right forearm numbness and minimal pain.  She denise weakness or coolness of the extremity.  Allergies  Allergen Reactions   Contrast Media [Iodinated Diagnostic Agents] Anaphylaxis    "code blue--I died"   Penicillins Rash and Other (See Comments)    Reaction: Childhood     Current Outpatient Medications  Medication Sig Dispense Refill   albuterol (PROVENTIL HFA;VENTOLIN HFA) 108 (90 BASE) MCG/ACT inhaler Inhale 1-2 puffs into the lungs every 6 (six) hours as needed for wheezing or shortness of breath. 1 Inhaler 0   amLODipine (NORVASC) 10 MG tablet Take 1 tablet (10 mg total) by mouth daily. 30 tablet 1   atorvastatin (LIPITOR) 80 MG tablet Take 1 tablet (80 mg total) by mouth daily at 6 PM. (Patient taking differently: Take 80 mg by mouth every morning.) 30 tablet 6   Cholecalciferol (VITAMIN D) 50 MCG (2000 UT) CAPS Take 2,000 Units by mouth daily.     cloNIDine (CATAPRES) 0.2 MG tablet Take 0.2 mg by mouth 2 (two) times daily.     Continuous Blood Gluc Receiver (DEXCOM G6 RECEIVER) DEVI USE TO CHECK GLUCOSE FOUR TIMES DAILY AS DIRECTED. 1 each 0   FLUoxetine (PROZAC) 10 MG tablet Take 10 mg by mouth See admin instructions. Take with 20 mg for a total of 30 mg daily     FLUoxetine (PROZAC) 20 MG capsule Take 20 mg by mouth See admin instructions. Take with 10 mg for a total of 30 mg daily     furosemide (LASIX) 80 MG tablet Take 80 mg by mouth 2 (two) times daily with breakfast and lunch.     gabapentin (NEURONTIN) 300 MG capsule Take 1 capsule (300  mg total) by mouth at bedtime. 30 capsule 2   hydrALAZINE (APRESOLINE) 50 MG tablet Take 50 mg by mouth 2 (two) times daily.     insulin aspart (NOVOLOG) 100 UNIT/ML FlexPen Inject 10-16 Units into the skin 3 (three) times daily before meals. Sliding scale     insulin detemir (LEVEMIR) 100 UNIT/ML FlexPen Inject 90 Units into the skin at bedtime. 15 mL    Lancets (ONETOUCH DELICA PLUS 123XX123) MISC Apply topically.     metoprolol succinate (TOPROL-XL) 50 MG 24 hr tablet Take 50 mg by mouth daily.     multivitamin (RENA-VIT) TABS tablet Take 1 tablet by mouth daily.     OLANZapine (ZYPREXA) 2.5 MG tablet Take 2.5 mg by mouth at bedtime.     omeprazole (PRILOSEC) 20 MG capsule Take 20 mg by mouth daily before breakfast.      ONETOUCH VERIO test strip SMARTSIG:Via Meter     oxyCODONE-acetaminophen (PERCOCET/ROXICET) 5-325 MG tablet Take 1-2 tablets by mouth every 6 (six) hours as needed for moderate pain. 20 tablet 0   sodium bicarbonate 650 MG tablet Take 1,950 mg by mouth daily.     traZODone (DESYREL) 100 MG tablet Take 100 mg by mouth at bedtime.     No current facility-administered medications  for this visit.     ROS:  See HPI  Physical Exam:    Incision:  well healed without erythema or edema.  Extremities:  grip 5/5 B UE, audible thrill in the right UE basilic vein fistula. Neuro: decreased sensation in the right volar forearm    Assessment/Plan:  This is a 49 y.o. female who is s/p: ligation of the left AV fistula due to ischemic steal and S/P first stage right UE basilic.   He symptoms are minimal and likely temporary.  She has intact motor, palpable radial pulse, skin is warm to touch without ischemic changes.     She will keep her f/u appointment latter this month to review a fistula duplex and check on her symptoms.  If she develops increased symptoms of steal.  She will call sooner.  I gave her a squeeze ball for exercise.  Roxy Horseman PA-C [facility] [phone]   Clinic MD:  Carlis Abbott

## 2021-03-09 DIAGNOSIS — Z992 Dependence on renal dialysis: Secondary | ICD-10-CM | POA: Diagnosis not present

## 2021-03-09 DIAGNOSIS — Z23 Encounter for immunization: Secondary | ICD-10-CM | POA: Diagnosis not present

## 2021-03-09 DIAGNOSIS — N186 End stage renal disease: Secondary | ICD-10-CM | POA: Diagnosis not present

## 2021-03-11 DIAGNOSIS — N186 End stage renal disease: Secondary | ICD-10-CM | POA: Diagnosis not present

## 2021-03-11 DIAGNOSIS — Z992 Dependence on renal dialysis: Secondary | ICD-10-CM | POA: Diagnosis not present

## 2021-03-11 DIAGNOSIS — Z23 Encounter for immunization: Secondary | ICD-10-CM | POA: Diagnosis not present

## 2021-03-13 ENCOUNTER — Other Ambulatory Visit: Payer: Self-pay

## 2021-03-13 DIAGNOSIS — N186 End stage renal disease: Secondary | ICD-10-CM

## 2021-03-13 DIAGNOSIS — Z992 Dependence on renal dialysis: Secondary | ICD-10-CM

## 2021-03-14 DIAGNOSIS — D509 Iron deficiency anemia, unspecified: Secondary | ICD-10-CM | POA: Diagnosis not present

## 2021-03-14 DIAGNOSIS — Z23 Encounter for immunization: Secondary | ICD-10-CM | POA: Diagnosis not present

## 2021-03-14 DIAGNOSIS — N186 End stage renal disease: Secondary | ICD-10-CM | POA: Diagnosis not present

## 2021-03-14 DIAGNOSIS — N2581 Secondary hyperparathyroidism of renal origin: Secondary | ICD-10-CM | POA: Diagnosis not present

## 2021-03-14 DIAGNOSIS — Z992 Dependence on renal dialysis: Secondary | ICD-10-CM | POA: Diagnosis not present

## 2021-03-16 DIAGNOSIS — Z23 Encounter for immunization: Secondary | ICD-10-CM | POA: Diagnosis not present

## 2021-03-16 DIAGNOSIS — N186 End stage renal disease: Secondary | ICD-10-CM | POA: Diagnosis not present

## 2021-03-16 DIAGNOSIS — Z992 Dependence on renal dialysis: Secondary | ICD-10-CM | POA: Diagnosis not present

## 2021-03-18 DIAGNOSIS — Z992 Dependence on renal dialysis: Secondary | ICD-10-CM | POA: Diagnosis not present

## 2021-03-18 DIAGNOSIS — Z23 Encounter for immunization: Secondary | ICD-10-CM | POA: Diagnosis not present

## 2021-03-18 DIAGNOSIS — N186 End stage renal disease: Secondary | ICD-10-CM | POA: Diagnosis not present

## 2021-03-20 DIAGNOSIS — E1165 Type 2 diabetes mellitus with hyperglycemia: Secondary | ICD-10-CM | POA: Diagnosis not present

## 2021-03-21 DIAGNOSIS — N186 End stage renal disease: Secondary | ICD-10-CM | POA: Diagnosis not present

## 2021-03-21 DIAGNOSIS — Z23 Encounter for immunization: Secondary | ICD-10-CM | POA: Diagnosis not present

## 2021-03-21 DIAGNOSIS — Z992 Dependence on renal dialysis: Secondary | ICD-10-CM | POA: Diagnosis not present

## 2021-03-21 DIAGNOSIS — E119 Type 2 diabetes mellitus without complications: Secondary | ICD-10-CM | POA: Diagnosis not present

## 2021-03-21 DIAGNOSIS — Z794 Long term (current) use of insulin: Secondary | ICD-10-CM | POA: Diagnosis not present

## 2021-03-23 DIAGNOSIS — N186 End stage renal disease: Secondary | ICD-10-CM | POA: Diagnosis not present

## 2021-03-23 DIAGNOSIS — Z23 Encounter for immunization: Secondary | ICD-10-CM | POA: Diagnosis not present

## 2021-03-23 DIAGNOSIS — Z992 Dependence on renal dialysis: Secondary | ICD-10-CM | POA: Diagnosis not present

## 2021-03-25 DIAGNOSIS — N186 End stage renal disease: Secondary | ICD-10-CM | POA: Diagnosis not present

## 2021-03-25 DIAGNOSIS — Z23 Encounter for immunization: Secondary | ICD-10-CM | POA: Diagnosis not present

## 2021-03-25 DIAGNOSIS — Z992 Dependence on renal dialysis: Secondary | ICD-10-CM | POA: Diagnosis not present

## 2021-03-28 DIAGNOSIS — Z992 Dependence on renal dialysis: Secondary | ICD-10-CM | POA: Diagnosis not present

## 2021-03-28 DIAGNOSIS — Z23 Encounter for immunization: Secondary | ICD-10-CM | POA: Diagnosis not present

## 2021-03-28 DIAGNOSIS — N186 End stage renal disease: Secondary | ICD-10-CM | POA: Diagnosis not present

## 2021-03-29 ENCOUNTER — Encounter (HOSPITAL_COMMUNITY): Payer: Medicare HMO

## 2021-03-30 DIAGNOSIS — N186 End stage renal disease: Secondary | ICD-10-CM | POA: Diagnosis not present

## 2021-03-30 DIAGNOSIS — Z23 Encounter for immunization: Secondary | ICD-10-CM | POA: Diagnosis not present

## 2021-03-30 DIAGNOSIS — Z992 Dependence on renal dialysis: Secondary | ICD-10-CM | POA: Diagnosis not present

## 2021-03-31 ENCOUNTER — Ambulatory Visit: Payer: Medicare HMO | Admitting: Nurse Practitioner

## 2021-03-31 ENCOUNTER — Other Ambulatory Visit: Payer: Self-pay

## 2021-03-31 DIAGNOSIS — Z794 Long term (current) use of insulin: Secondary | ICD-10-CM

## 2021-03-31 NOTE — Progress Notes (Signed)
Patient came into the office today because she is having trouble getting her Dexcom G6 to work properly. Patient stated that after the initial set up everything was fine and then she started getting a "Sensor Code" alert on her Dexcom. Patient removed her sensor and transmitter. Patient brought her own supplies today and I applied a new sensor with the original transmitter to her new sensor on her left lower abdomen. I updated her Dexcom and everything was working for her at this time. I showed her where the [employer] Support Telephone number was on her insert with her supplies and advised for her to call the support line if her Dexcom G6 did not read after the 2 hour warm up time. Patient verbalized an understanding and thanked me and will reach out to [place] if she needs further assistance.

## 2021-04-01 DIAGNOSIS — Z23 Encounter for immunization: Secondary | ICD-10-CM | POA: Diagnosis not present

## 2021-04-01 DIAGNOSIS — Z992 Dependence on renal dialysis: Secondary | ICD-10-CM | POA: Diagnosis not present

## 2021-04-01 DIAGNOSIS — N186 End stage renal disease: Secondary | ICD-10-CM | POA: Diagnosis not present

## 2021-04-04 DIAGNOSIS — Z992 Dependence on renal dialysis: Secondary | ICD-10-CM | POA: Diagnosis not present

## 2021-04-04 DIAGNOSIS — Z23 Encounter for immunization: Secondary | ICD-10-CM | POA: Diagnosis not present

## 2021-04-04 DIAGNOSIS — N186 End stage renal disease: Secondary | ICD-10-CM | POA: Diagnosis not present

## 2021-04-06 DIAGNOSIS — Z992 Dependence on renal dialysis: Secondary | ICD-10-CM | POA: Diagnosis not present

## 2021-04-06 DIAGNOSIS — N186 End stage renal disease: Secondary | ICD-10-CM | POA: Diagnosis not present

## 2021-04-08 DIAGNOSIS — N186 End stage renal disease: Secondary | ICD-10-CM | POA: Diagnosis not present

## 2021-04-08 DIAGNOSIS — Z992 Dependence on renal dialysis: Secondary | ICD-10-CM | POA: Diagnosis not present

## 2021-04-11 DIAGNOSIS — N186 End stage renal disease: Secondary | ICD-10-CM | POA: Diagnosis not present

## 2021-04-11 DIAGNOSIS — Z992 Dependence on renal dialysis: Secondary | ICD-10-CM | POA: Diagnosis not present

## 2021-04-13 DIAGNOSIS — N186 End stage renal disease: Secondary | ICD-10-CM | POA: Diagnosis not present

## 2021-04-13 DIAGNOSIS — Z992 Dependence on renal dialysis: Secondary | ICD-10-CM | POA: Diagnosis not present

## 2021-04-18 DIAGNOSIS — N2581 Secondary hyperparathyroidism of renal origin: Secondary | ICD-10-CM | POA: Diagnosis not present

## 2021-04-18 DIAGNOSIS — D509 Iron deficiency anemia, unspecified: Secondary | ICD-10-CM | POA: Diagnosis not present

## 2021-04-18 DIAGNOSIS — Z992 Dependence on renal dialysis: Secondary | ICD-10-CM | POA: Diagnosis not present

## 2021-04-18 DIAGNOSIS — N186 End stage renal disease: Secondary | ICD-10-CM | POA: Diagnosis not present

## 2021-04-18 NOTE — H&P (View-Only) (Signed)
[facility] [place]  ASSESSMENT / PLAN: 50 y.o. female with ESRD dialyzing MWF via RIJ TDC. She is status post right first stage basilic vein transposition 02/24/21. Duplex today shows maturation of the fistula. Plan second stage basilic transposition 72/5/36.   CHIEF COMPLAINT: ESRD in need of HD  HISTORY OF PRESENT ILLNESS: Samantha Pierce is a 50 y.o. female with ESRD dialyzing MWF via RIJ TDC. The patient underwent right first stage basilic vein transposition 02/24/21. She tolerated this well.  She has no steal symptoms in her right hand.  She is eager to transition from catheter to fistula.    Past Medical History:  Diagnosis Date   Anemia    Anxiety    Asystole ([place])    a. 05/2015: pt developed bradycardia->asystole during [place] nuclear stress test, s/p brief code. Cath with only 30% prox LCx. Her asystole during Lexiscan infusion was felt to represent an excessive pharmacologic response to the agent and likely superimposed vasovagal response.   Bipolar disorder ([place])    CKD (chronic kidney disease) stage 4, GFR 15-29 ml/min (HCC)    M_W_F dialysis   COPD (chronic obstructive pulmonary disease) (HCC)    Depression    Essential hypertension    GERD (gastroesophageal reflux disease)    History of blood transfusion    x 2   History of seizure 2017   only one seizure - none since   History of stroke April 2016   Acute lacunar infarct of the left thalamus   Leg cramps    Mild CAD    a. LHC 05/2015: 30% prox Cx, otherwise widely patent.   Morbid obesity ([place])    Neuromuscular disorder ([place])    neuropathy in feet   Neuropathy    feet   PTSD (post-traumatic stress disorder)    Stroke ([place])    x 2, right side weakness   Type 2 diabetes mellitus ([place])     Past Surgical History:  Procedure Laterality Date   AV FISTULA PLACEMENT Right 01/08/2017   Procedure: RIGHT ARM BRACHIOCEPHALIC ARTERIOVENOUS (AV) FISTULA CREATION;  Surgeon: Waynetta Sandy, MD;  Location: [facility];  Service: Vascular;  Laterality: Right;   AV FISTULA PLACEMENT Left 11/20/2018   Procedure: Creation of LEFT ARM ARTERIOVENOUS (AV) FISTULA;  Surgeon: Waynetta Sandy, MD;  Location: [facility];  Service: Vascular;  Laterality: Left;   AV FISTULA PLACEMENT Right 02/24/2021   Procedure: RIGHT UPPER ARM BASILIC VEIN ARTERIOVENOUS (AV) FISTULA CREATION;  Surgeon: Cherre Robins, MD;  Location: [facility];  Service: Vascular;  Laterality: Right;   [place] Left 02/11/2019   Procedure: BASILIC VEIN TRANSPOSITION SECOND STAGE;  Surgeon: Waynetta Sandy, MD;  Location: [facility];  Service: Vascular;  Laterality: Left;   CARDIAC CATHETERIZATION N/A 06/01/2015   Procedure: Left Heart Cath and Coronary Angiography;  Surgeon: Belva Crome, MD; CFX 30%, no other dz, EF nl by echo   CESAREAN SECTION     x2   DILATION AND CURETTAGE OF UTERUS     EYE SURGERY     Right eye pars plano vitrectomy    HEMATOMA EVACUATION Left 03/03/2019   Procedure: EVACUATION HEMATOMA;  Surgeon: Elam Dutch, MD;  Location: [facility];  Service: Vascular;  Laterality: Left;   INSERTION OF DIALYSIS CATHETER Right 01/04/2021   Procedure: INSERTION OF RIGHT INTERNAL JUGULAR TUNNELED DIALYSIS CATHETER;  Surgeon: Elam Dutch, MD;  Location: [facility];  Service: Vascular;  Laterality: Right;  LASIK     LIGATION OF ARTERIOVENOUS  FISTULA Left 01/04/2021   Procedure: LIGATION OF LEFT ARM ARTERIOVENOUS  FISTULA;  Surgeon: Elam Dutch, MD;  Location: [facility];  Service: Vascular;  Laterality: Left;   PARS PLANA VITRECTOMY  04/20/2011   Procedure: PARS PLANA VITRECTOMY WITH 25 GAUGE;  Surgeon: Hayden Pedro, MD;  Location: [facility];  Service: Ophthalmology;  Laterality: Left;  membrane peel, gas fluid exchange, endolaser, repair of complex retinal detachment   PATCH ANGIOPLASTY Left 10/05/2020   Procedure: PATCH ANGIOPLASTY USING Rueben Bash BIOLOGIC PATCH;  Surgeon: Elam Dutch,  MD;  Location: [facility];  Service: Vascular;  Laterality: Left;   PERIPHERAL VASCULAR BALLOON ANGIOPLASTY Left 09/02/2020   Procedure: PERIPHERAL VASCULAR BALLOON ANGIOPLASTY;  Surgeon: Marty Heck, MD;  Location: [facility];  Service: Cardiovascular;  Laterality: Left;  arm fistula   REVISON OF ARTERIOVENOUS FISTULA Left 10/05/2020   Procedure: REVISION OF LEFT ARM ARTERIOVENOUS FISTULA;  Surgeon: Elam Dutch, MD;  Location: [facility];  Service: Vascular;  Laterality: Left;   TUBAL LIGATION      Family History  Problem Relation Age of Onset   Diabetes Mother    Kidney failure Mother    Diabetes Father    Amblyopia Neg Hx    Blindness Neg Hx    Cataracts Neg Hx    Glaucoma Neg Hx    Macular degeneration Neg Hx    Retinal detachment Neg Hx    Strabismus Neg Hx    Retinitis pigmentosa Neg Hx     Social History   Socioeconomic History   Marital status: Single    Spouse name: Not on file   Number of children: Not on file   Years of education: Not on file   Highest education level: Not on file  Occupational History   Not on file  Tobacco Use   Smoking status: Former    Packs/day: 0.03    Years: 30.00    Pack years: 0.90    Types: Cigarettes    Start date: 06/05/1981    Quit date: 12/04/2018    Years since quitting: 2.3   Smokeless tobacco: Never  Vaping Use   Vaping Use: Never used  Substance and Sexual Activity   Alcohol use: Not Currently    Comment: occ wine   Drug use: No   Sexual activity: Yes    Birth control/protection: Surgical  Other Topics Concern   Not on file  Social History Narrative   Not on file   Social Determinants of Health   Financial Resource Strain: Not on file  Food Insecurity: Not on file  Transportation Needs: Not on file  Physical Activity: Not on file  Stress: Not on file  Social Connections: Not on file  Intimate Partner Violence: Not on file    Allergies  Allergen Reactions   Contrast Media [Iodinated Diagnostic  Agents] Anaphylaxis    "code blue--I died"   Penicillins Rash and Other (See Comments)    Reaction: Childhood     Current Outpatient Medications  Medication Sig Dispense Refill   albuterol (PROVENTIL HFA;VENTOLIN HFA) 108 (90 BASE) MCG/ACT inhaler Inhale 1-2 puffs into the lungs every 6 (six) hours as needed for wheezing or shortness of breath. 1 Inhaler 0   amLODipine (NORVASC) 10 MG tablet Take 1 tablet (10 mg total) by mouth daily. 30 tablet 1   atorvastatin (LIPITOR) 80 MG tablet Take 1 tablet (80 mg total) by mouth daily at 6  PM. (Patient taking differently: Take 80 mg by mouth every morning.) 30 tablet 6   Cholecalciferol (VITAMIN D) 50 MCG (2000 UT) CAPS Take 2,000 Units by mouth daily.     cloNIDine (CATAPRES) 0.2 MG tablet Take 0.2 mg by mouth 2 (two) times daily.     Continuous Blood Gluc Receiver (DEXCOM G6 RECEIVER) DEVI USE TO CHECK GLUCOSE FOUR TIMES DAILY AS DIRECTED. 1 each 0   FLUoxetine (PROZAC) 10 MG tablet Take 10 mg by mouth See admin instructions. Take with 20 mg for a total of 30 mg daily     FLUoxetine (PROZAC) 20 MG capsule Take 20 mg by mouth See admin instructions. Take with 10 mg for a total of 30 mg daily     furosemide (LASIX) 80 MG tablet Take 80 mg by mouth 2 (two) times daily with breakfast and lunch.     gabapentin (NEURONTIN) 300 MG capsule Take 1 capsule (300 mg total) by mouth at bedtime. 30 capsule 2   hydrALAZINE (APRESOLINE) 50 MG tablet Take 50 mg by mouth 2 (two) times daily.     insulin aspart (NOVOLOG) 100 UNIT/ML FlexPen Inject 10-16 Units into the skin 3 (three) times daily before meals. Sliding scale     insulin detemir (LEVEMIR) 100 UNIT/ML FlexPen Inject 90 Units into the skin at bedtime. 15 mL    Lancets (ONETOUCH DELICA PLUS EGBTDV76H) MISC Apply topically.     metoprolol succinate (TOPROL-XL) 50 MG 24 hr tablet Take 50 mg by mouth daily.     multivitamin (RENA-VIT) TABS tablet Take 1 tablet by mouth daily.     OLANZapine (ZYPREXA) 2.5 MG  tablet Take 2.5 mg by mouth at bedtime.     omeprazole (PRILOSEC) 20 MG capsule Take 20 mg by mouth daily before breakfast.      ONETOUCH VERIO test strip SMARTSIG:Via Meter     oxyCODONE-acetaminophen (PERCOCET/ROXICET) 5-325 MG tablet Take 1-2 tablets by mouth every 6 (six) hours as needed for moderate pain. 20 tablet 0   sodium bicarbonate 650 MG tablet Take 1,950 mg by mouth daily.     traZODone (DESYREL) 100 MG tablet Take 100 mg by mouth at bedtime.     No current facility-administered medications for this visit.    REVIEW OF SYSTEMS:  [X]  denotes positive finding, [ ]  denotes negative finding Cardiac  Comments:  Chest pain or chest pressure:    Shortness of breath upon exertion:    Short of breath when lying flat:    Irregular heart rhythm:        Vascular    Pain in calf, thigh, or hip brought on by ambulation:    Pain in feet at night that wakes you up from your sleep:     Blood clot in your veins:    Leg swelling:         Pulmonary    Oxygen at home:    Productive cough:     Wheezing:         Neurologic    Sudden weakness in arms or legs:     Sudden numbness in arms or legs:     Sudden onset of difficulty speaking or slurred speech:    Temporary loss of vision in one eye:     Problems with dizziness:         Gastrointestinal    Blood in stool:     Vomited blood:         Genitourinary    Burning when urinating:  Blood in urine:        Psychiatric    Major depression:         Hematologic    Bleeding problems:    Problems with blood clotting too easily:        Skin    Rashes or ulcers:        Constitutional    Fever or chills:      PHYSICAL EXAM Vitals:   04/19/21 0859  BP: (!) 166/74  Pulse: 60  Temp: 97.8 F (36.6 C)  SpO2: 97%  Weight: 264 lb (119.7 kg)  Height: 5\' 4"  (1.626 m)    Constitutional: Obese. Well appearing. No distress.  Neurologic: CN intact. no focal findings. no sensory loss. Psychiatric:  Mood and affect symmetric  and appropriate. Eyes:  No icterus. No conjunctival pallor. Ears, nose, throat:  mucous membranes moist. Midline trachea.  Cardiac: regular rate and rhythm.  Respiratory:  unlabored. Abdominal:  soft, non-tender, non-distended.  Peripheral vascular: smooth thrill in RUE AVF. 1+ radial pulse. Extremity: no edema. no cyanosis. no pallor.  Skin: No gangrene. No ulceration.  Lymphatic: no Stemmer's sign. no palpable lymphadenopathy.  PERTINENT LABORATORY AND RADIOLOGIC DATA  Most recent CBC CBC Latest Ref Rng & Units 02/25/2021 02/24/2021 01/04/2021  WBC 4.0 - 10.5 K/uL 6.6 - -  Hemoglobin 12.0 - 15.0 g/dL 12.9 12.9 12.6  Hematocrit 36.0 - 46.0 % 41.2 38.0 37.0  Platelets 150 - 400 K/uL 171 - -     Most recent CMP CMP Latest Ref Rng & Units 02/25/2021 02/24/2021 01/04/2021  Glucose 70 - 99 mg/dL 167(H) 271(H) 193(H)  BUN 6 - 20 mg/dL 25(H) 18 34(H)  Creatinine 0.44 - 1.00 mg/dL 4.84(H) 3.80(H) 5.30(H)  Sodium 135 - 145 mmol/L 134(L) 135 132(L)  Potassium 3.5 - 5.1 mmol/L 4.2 4.0 4.4  Chloride 98 - 111 mmol/L 99 98 105  CO2 22 - 32 mmol/L 24 - -  Calcium 8.9 - 10.3 mg/dL 9.0 - -  Total Protein 6.5 - 8.1 g/dL - - -  Total Bilirubin 0.3 - 1.2 mg/dL - - -  Alkaline Phos 38 - 126 U/L - - -  AST 15 - 41 U/L - - -  ALT 0 - 44 U/L - - -    Renal function CrCl cannot be calculated (Patient's most recent lab result is older than the maximum 21 days allowed.).  Hemoglobin A1C  Date Value  02/22/2021 10.2 % (A)  05/07/2020 12.4   Hgb A1c MFr Bld (%)  Date Value  10/05/2020 8.7 (H)    LDL Cholesterol  Date Value Ref Range Status  05/23/2015 144 (H) 0 - 99 mg/dL Final    Comment:           Total Cholesterol/HDL:CHD Risk Coronary Heart Disease Risk Table                     Men   Women  1/2 Average Risk   3.4   3.3  Average Risk       5.0   4.4  2 X Average Risk   9.6   7.1  3 X Average Risk  23.4   11.0        Use the calculated Patient Ratio above and the CHD Risk Table to  determine the patient's CHD Risk.        ATP III CLASSIFICATION (LDL):  <100     mg/dL   Optimal  100-129  mg/dL  Near or Above                    Optimal  130-159  mg/dL   Borderline  160-189  mg/dL   High  >190     mg/dL   Very High      Vascular Imaging: AVF appears to be maturing well (Flow volume 1.5L/m, diameter 0.5 -->0.7).  Yevonne Aline. Stanford Breed, MD [facility] Phone Number: [phone] 04/19/2021 9:29 AM  Total time spent on preparing this encounter including chart review, data review, collecting history, examining the patient, coordinating care for this established patient, 40 minutes.  Portions of this report may have been transcribed using voice recognition software.  Every effort has been made to ensure accuracy; however, inadvertent computerized transcription errors may still be present.

## 2021-04-18 NOTE — Progress Notes (Signed)
[facility] [name]: 50 y.o. female with ESRD dialyzing MWF via RIJ TDC. She is status post right first stage basilic vein transposition 02/24/21. Duplex today shows maturation of the fistula. Plan second stage basilic transposition 17/0/01.   CHIEF COMPLAINT: ESRD in need of HD  HISTORY OF PRESENT ILLNESS: Samantha Pierce is a 50 y.o. female with ESRD dialyzing MWF via RIJ TDC. The patient underwent right first stage basilic vein transposition 02/24/21. She tolerated this well.  She has no steal symptoms in her right hand.  She is eager to transition from catheter to fistula.    Past Medical History:  Diagnosis Date   Anemia    Anxiety    Asystole ([place])    a. 05/2015: pt developed bradycardia->asystole during [place] nuclear stress test, s/p brief code. Cath with only 30% prox LCx. Her asystole during Lexiscan infusion was felt to represent an excessive pharmacologic response to the agent and likely superimposed vasovagal response.   Bipolar disorder ([place])    CKD (chronic kidney disease) stage 4, GFR 15-29 ml/min (HCC)    M_W_F dialysis   COPD (chronic obstructive pulmonary disease) (HCC)    Depression    Essential hypertension    GERD (gastroesophageal reflux disease)    History of blood transfusion    x 2   History of seizure 2017   only one seizure - none since   History of stroke April 2016   Acute lacunar infarct of the left thalamus   Leg cramps    Mild CAD    a. LHC 05/2015: 30% prox Cx, otherwise widely patent.   Morbid obesity ([place])    Neuromuscular disorder ([place])    neuropathy in feet   Neuropathy    feet   PTSD (post-traumatic stress disorder)    Stroke ([place])    x 2, right side weakness   Type 2 diabetes mellitus ([place])     Past Surgical History:  Procedure Laterality Date   AV FISTULA PLACEMENT Right 01/08/2017   Procedure: RIGHT ARM BRACHIOCEPHALIC ARTERIOVENOUS (AV) FISTULA CREATION;  Surgeon: Waynetta Sandy, MD;  Location: [place];  Service: Vascular;  Laterality: Right;   AV FISTULA PLACEMENT Left 11/20/2018   Procedure: Creation of LEFT ARM ARTERIOVENOUS (AV) FISTULA;  Surgeon: Waynetta Sandy, MD;  Location: [place];  Service: Vascular;  Laterality: Left;   AV FISTULA PLACEMENT Right 02/24/2021   Procedure: RIGHT UPPER ARM BASILIC VEIN ARTERIOVENOUS (AV) FISTULA CREATION;  Surgeon: Cherre Robins, MD;  Location: [place];  Service: Vascular;  Laterality: Right;   [place] Left 02/11/2019   Procedure: BASILIC VEIN TRANSPOSITION SECOND STAGE;  Surgeon: Waynetta Sandy, MD;  Location: [place];  Service: Vascular;  Laterality: Left;   CARDIAC CATHETERIZATION N/A 06/01/2015   Procedure: Left Heart Cath and Coronary Angiography;  Surgeon: Belva Crome, MD; CFX 30%, no other dz, EF nl by echo   CESAREAN SECTION     x2   DILATION AND CURETTAGE OF UTERUS     EYE SURGERY     Right eye pars plano vitrectomy    HEMATOMA EVACUATION Left 03/03/2019   Procedure: EVACUATION HEMATOMA;  Surgeon: Elam Dutch, MD;  Location: [place];  Service: Vascular;  Laterality: Left;   INSERTION OF DIALYSIS CATHETER Right 01/04/2021   Procedure: INSERTION OF RIGHT INTERNAL JUGULAR TUNNELED DIALYSIS CATHETER;  Surgeon: Elam Dutch, MD;  Location: [place];  Service: Vascular;  Laterality: Right;  LASIK     LIGATION OF ARTERIOVENOUS  FISTULA Left 01/04/2021   Procedure: LIGATION OF LEFT ARM ARTERIOVENOUS  FISTULA;  Surgeon: Elam Dutch, MD;  Location: [place];  Service: Vascular;  Laterality: Left;   PARS PLANA VITRECTOMY  04/20/2011   Procedure: PARS PLANA VITRECTOMY WITH 25 GAUGE;  Surgeon: Hayden Pedro, MD;  Location: [facility];  Service: Ophthalmology;  Laterality: Left;  membrane peel, gas fluid exchange, endolaser, repair of complex retinal detachment   PATCH ANGIOPLASTY Left 10/05/2020   Procedure: PATCH ANGIOPLASTY USING Rueben Bash BIOLOGIC PATCH;  Surgeon: Elam Dutch,  MD;  Location: [place];  Service: Vascular;  Laterality: Left;   PERIPHERAL VASCULAR BALLOON ANGIOPLASTY Left 09/02/2020   Procedure: PERIPHERAL VASCULAR BALLOON ANGIOPLASTY;  Surgeon: Marty Heck, MD;  Location: [facility];  Service: Cardiovascular;  Laterality: Left;  arm fistula   REVISON OF ARTERIOVENOUS FISTULA Left 10/05/2020   Procedure: REVISION OF LEFT ARM ARTERIOVENOUS FISTULA;  Surgeon: Elam Dutch, MD;  Location: [facility] OR;  Service: Vascular;  Laterality: Left;   TUBAL LIGATION      Family History  Problem Relation Age of Onset   Diabetes Mother    Kidney failure Mother    Diabetes Father    Amblyopia Neg Hx    Blindness Neg Hx    Cataracts Neg Hx    Glaucoma Neg Hx    Macular degeneration Neg Hx    Retinal detachment Neg Hx    Strabismus Neg Hx    Retinitis pigmentosa Neg Hx     Social History   Socioeconomic History   Marital status: Single    Spouse name: Not on file   Number of children: Not on file   Years of education: Not on file   Highest education level: Not on file  Occupational History   Not on file  Tobacco Use   Smoking status: Former    Packs/day: 0.03    Years: 30.00    Pack years: 0.90    Types: Cigarettes    Start date: 06/05/1981    Quit date: 12/04/2018    Years since quitting: 2.3   Smokeless tobacco: Never  Vaping Use   Vaping Use: Never used  Substance and Sexual Activity   Alcohol use: Not Currently    Comment: occ wine   Drug use: No   Sexual activity: Yes    Birth control/protection: Surgical  Other Topics Concern   Not on file  Social History Narrative   Not on file   Social Determinants of Health   Financial Resource Strain: Not on file  Food Insecurity: Not on file  Transportation Needs: Not on file  Physical Activity: Not on file  Stress: Not on file  Social Connections: Not on file  Intimate Partner Violence: Not on file    Allergies  Allergen Reactions   Contrast Media [Iodinated [employer]  [employer]] Anaphylaxis    "code blue--I died"   Penicillins Rash and Other (See Comments)    Reaction: Childhood     Current Outpatient Medications  Medication Sig Dispense Refill   albuterol (PROVENTIL HFA;VENTOLIN HFA) 108 (90 BASE) MCG/ACT inhaler Inhale 1-2 puffs into the lungs every 6 (six) hours as needed for wheezing or shortness of breath. 1 Inhaler 0   amLODipine (NORVASC) 10 MG tablet Take 1 tablet (10 mg total) by mouth daily. 30 tablet 1   atorvastatin (LIPITOR) 80 MG tablet Take 1 tablet (80 mg total) by mouth daily at 6  PM. (Patient taking differently: Take 80 mg by mouth every morning.) 30 tablet 6   Cholecalciferol (VITAMIN D) 50 MCG (2000 UT) CAPS Take 2,000 Units by mouth daily.     cloNIDine (CATAPRES) 0.2 MG tablet Take 0.2 mg by mouth 2 (two) times daily.     Continuous Blood Gluc Receiver (DEXCOM G6 RECEIVER) DEVI USE TO CHECK GLUCOSE FOUR TIMES DAILY AS DIRECTED. 1 each 0   FLUoxetine (PROZAC) 10 MG tablet Take 10 mg by mouth See admin instructions. Take with 20 mg for a total of 30 mg daily     FLUoxetine (PROZAC) 20 MG capsule Take 20 mg by mouth See admin instructions. Take with 10 mg for a total of 30 mg daily     furosemide (LASIX) 80 MG tablet Take 80 mg by mouth 2 (two) times daily with breakfast and lunch.     gabapentin (NEURONTIN) 300 MG capsule Take 1 capsule (300 mg total) by mouth at bedtime. 30 capsule 2   hydrALAZINE (APRESOLINE) 50 MG tablet Take 50 mg by mouth 2 (two) times daily.     insulin aspart (NOVOLOG) 100 UNIT/ML FlexPen Inject 10-16 Units into the skin 3 (three) times daily before meals. Sliding scale     insulin detemir (LEVEMIR) 100 UNIT/ML FlexPen Inject 90 Units into the skin at bedtime. 15 mL    Lancets (ONETOUCH DELICA PLUS [id number]) MISC Apply topically.     metoprolol succinate (TOPROL-XL) 50 MG 24 hr tablet Take 50 mg by mouth daily.     multivitamin (RENA-VIT) TABS tablet Take 1 tablet by mouth daily.     OLANZapine (ZYPREXA) 2.5 MG  tablet Take 2.5 mg by mouth at bedtime.     omeprazole (PRILOSEC) 20 MG capsule Take 20 mg by mouth daily before breakfast.      ONETOUCH VERIO test strip SMARTSIG:Via Meter     oxyCODONE-acetaminophen (PERCOCET/ROXICET) 5-325 MG tablet Take 1-2 tablets by mouth every 6 (six) hours as needed for moderate pain. 20 tablet 0   sodium bicarbonate 650 MG tablet Take 1,950 mg by mouth daily.     traZODone (DESYREL) 100 MG tablet Take 100 mg by mouth at bedtime.     No current facility-administered medications for this visit.    REVIEW OF SYSTEMS:  [X]  denotes positive finding, [ ]  denotes negative finding Cardiac  Comments:  Chest pain or chest pressure:    Shortness of breath upon exertion:    Short of breath when lying flat:    Irregular heart rhythm:        Vascular    Pain in calf, thigh, or hip brought on by ambulation:    Pain in feet at night that wakes you up from your sleep:     Blood clot in your veins:    Leg swelling:         Pulmonary    Oxygen at home:    Productive cough:     Wheezing:         Neurologic    Sudden weakness in arms or legs:     Sudden numbness in arms or legs:     Sudden onset of difficulty speaking or slurred speech:    Temporary loss of vision in one eye:     Problems with dizziness:         Gastrointestinal    Blood in stool:     Vomited blood:         Genitourinary    Burning when urinating:  Blood in urine:        Psychiatric    Major depression:         Hematologic    Bleeding problems:    Problems with blood clotting too easily:        Skin    Rashes or ulcers:        Constitutional    Fever or chills:      PHYSICAL EXAM Vitals:   04/19/21 0859  BP: (!) 166/74  Pulse: 60  Temp: 97.8 F (36.6 C)  SpO2: 97%  Weight: 264 lb (119.7 kg)  Height: 5\' 4"  (1.626 m)    Constitutional: Obese. Well appearing. No distress.  Neurologic: CN intact. no focal findings. no sensory loss. Psychiatric:  Mood and affect symmetric  and appropriate. Eyes:  No icterus. No conjunctival pallor. Ears, nose, throat:  mucous membranes moist. Midline trachea.  Cardiac: regular rate and rhythm.  Respiratory:  unlabored. Abdominal:  soft, non-tender, non-distended.  Peripheral vascular: smooth thrill in RUE AVF. 1+ radial pulse. Extremity: no edema. no cyanosis. no pallor.  Skin: No gangrene. No ulceration.  Lymphatic: no Stemmer's sign. no palpable lymphadenopathy.  PERTINENT LABORATORY AND RADIOLOGIC DATA  Most recent CBC CBC Latest Ref Rng & Units 02/25/2021 02/24/2021 01/04/2021  WBC 4.0 - 10.5 K/uL 6.6 - -  Hemoglobin 12.0 - 15.0 g/dL 12.9 12.9 12.6  Hematocrit 36.0 - 46.0 % 41.2 38.0 37.0  Platelets 150 - 400 K/uL 171 - -     Most recent CMP CMP Latest Ref Rng & Units 02/25/2021 02/24/2021 01/04/2021  Glucose 70 - 99 mg/dL 167(H) 271(H) 193(H)  BUN 6 - 20 mg/dL 25(H) 18 34(H)  Creatinine 0.44 - 1.00 mg/dL 4.84(H) 3.80(H) 5.30(H)  Sodium 135 - 145 mmol/L 134(L) 135 132(L)  Potassium 3.5 - 5.1 mmol/L 4.2 4.0 4.4  Chloride 98 - 111 mmol/L 99 98 105  CO2 22 - 32 mmol/L 24 - -  Calcium 8.9 - 10.3 mg/dL 9.0 - -  Total Protein 6.5 - 8.1 g/dL - - -  Total Bilirubin 0.3 - 1.2 mg/dL - - -  Alkaline Phos 38 - 126 U/L - - -  AST 15 - 41 U/L - - -  ALT 0 - 44 U/L - - -    Renal function CrCl cannot be calculated (Patient's most recent lab result is older than the maximum 21 days allowed.).  Hemoglobin A1C  Date Value  02/22/2021 10.2 % (A)  05/07/2020 12.4   Hgb A1c MFr Bld (%)  Date Value  10/05/2020 8.7 (H)    LDL Cholesterol  Date Value Ref Range Status  05/23/2015 144 (H) 0 - 99 mg/dL Final    Comment:           Total Cholesterol/HDL:CHD Risk Coronary Heart Disease Risk Table                     Men   Women  1/2 Average Risk   3.4   3.3  Average Risk       5.0   4.4  2 X Average Risk   9.6   7.1  3 X Average Risk  23.4   11.0        Use the calculated Patient Ratio above and the CHD Risk Table to  determine the patient's CHD Risk.        ATP III CLASSIFICATION (LDL):  <100     mg/dL   Optimal  100-129  mg/dL  Near or Above                    Optimal  130-159  mg/dL   Borderline  160-189  mg/dL   High  >190     mg/dL   Very High      Vascular Imaging: AVF appears to be maturing well (Flow volume 1.5L/m, diameter 0.5 -->0.7).  Yevonne Aline. Stanford Breed, MD [facility] Phone Number: [phone] 04/19/2021 9:29 AM  Total time spent on preparing this encounter including chart review, data review, collecting history, examining the patient, coordinating care for this established patient, 40 minutes.  Portions of this report may have been transcribed using voice recognition software.  Every effort has been made to ensure accuracy; however, inadvertent computerized transcription errors may still be present.

## 2021-04-19 ENCOUNTER — Other Ambulatory Visit: Payer: Self-pay

## 2021-04-19 ENCOUNTER — Ambulatory Visit (INDEPENDENT_AMBULATORY_CARE_PROVIDER_SITE_OTHER): Payer: Medicare HMO | Admitting: Vascular Surgery

## 2021-04-19 ENCOUNTER — Encounter: Payer: Self-pay | Admitting: Vascular Surgery

## 2021-04-19 ENCOUNTER — Ambulatory Visit (HOSPITAL_COMMUNITY)
Admission: RE | Admit: 2021-04-19 | Discharge: 2021-04-19 | Disposition: A | Discharge status: Home / Self Care | Payer: Medicare HMO | Source: Ambulatory Visit | Attending: Vascular Surgery | Admitting: Vascular Surgery

## 2021-04-19 VITALS — BP 166/74 | HR 60 | Temp 97.8°F | Ht 64.0 in | Wt 264.0 lb

## 2021-04-19 DIAGNOSIS — F411 Generalized anxiety disorder: Secondary | ICD-10-CM | POA: Diagnosis not present

## 2021-04-19 DIAGNOSIS — N186 End stage renal disease: Secondary | ICD-10-CM | POA: Diagnosis not present

## 2021-04-19 DIAGNOSIS — Z992 Dependence on renal dialysis: Secondary | ICD-10-CM

## 2021-04-19 DIAGNOSIS — R69 Illness, unspecified: Secondary | ICD-10-CM | POA: Diagnosis not present

## 2021-04-20 DIAGNOSIS — N186 End stage renal disease: Secondary | ICD-10-CM | POA: Diagnosis not present

## 2021-04-20 DIAGNOSIS — E1165 Type 2 diabetes mellitus with hyperglycemia: Secondary | ICD-10-CM | POA: Diagnosis not present

## 2021-04-20 DIAGNOSIS — Z992 Dependence on renal dialysis: Secondary | ICD-10-CM | POA: Diagnosis not present

## 2021-04-22 DIAGNOSIS — Z992 Dependence on renal dialysis: Secondary | ICD-10-CM | POA: Diagnosis not present

## 2021-04-22 DIAGNOSIS — N186 End stage renal disease: Secondary | ICD-10-CM | POA: Diagnosis not present

## 2021-04-25 DIAGNOSIS — Z992 Dependence on renal dialysis: Secondary | ICD-10-CM | POA: Diagnosis not present

## 2021-04-25 DIAGNOSIS — N186 End stage renal disease: Secondary | ICD-10-CM | POA: Diagnosis not present

## 2021-04-26 DIAGNOSIS — M25512 Pain in left shoulder: Secondary | ICD-10-CM | POA: Diagnosis not present

## 2021-04-26 DIAGNOSIS — N186 End stage renal disease: Secondary | ICD-10-CM | POA: Diagnosis not present

## 2021-04-26 DIAGNOSIS — J449 Chronic obstructive pulmonary disease, unspecified: Secondary | ICD-10-CM | POA: Diagnosis not present

## 2021-04-26 DIAGNOSIS — E113591 Type 2 diabetes mellitus with proliferative diabetic retinopathy without macular edema, right eye: Secondary | ICD-10-CM | POA: Diagnosis not present

## 2021-04-27 DIAGNOSIS — Z992 Dependence on renal dialysis: Secondary | ICD-10-CM | POA: Diagnosis not present

## 2021-04-27 DIAGNOSIS — N186 End stage renal disease: Secondary | ICD-10-CM | POA: Diagnosis not present

## 2021-04-29 DIAGNOSIS — Z992 Dependence on renal dialysis: Secondary | ICD-10-CM | POA: Diagnosis not present

## 2021-04-29 DIAGNOSIS — N186 End stage renal disease: Secondary | ICD-10-CM | POA: Diagnosis not present

## 2021-05-02 DIAGNOSIS — N186 End stage renal disease: Secondary | ICD-10-CM | POA: Diagnosis not present

## 2021-05-02 DIAGNOSIS — Z992 Dependence on renal dialysis: Secondary | ICD-10-CM | POA: Diagnosis not present

## 2021-05-04 ENCOUNTER — Encounter (HOSPITAL_COMMUNITY): Payer: Self-pay | Admitting: Vascular Surgery

## 2021-05-04 DIAGNOSIS — N186 End stage renal disease: Secondary | ICD-10-CM | POA: Diagnosis not present

## 2021-05-04 DIAGNOSIS — Z992 Dependence on renal dialysis: Secondary | ICD-10-CM | POA: Diagnosis not present

## 2021-05-04 NOTE — Progress Notes (Signed)
Anesthesia Chart Review: Samantha Pierce  Case: [id number] Date/Time: 05/05/21 0951   Procedure: RIGHT SECOND STAGE BASILIC VEIN TRANSPOSITION (Right) - PERIPHERAL NERVE BLOCK   Anesthesia type: Monitor Anesthesia Care   Pre-op diagnosis: ESRD   Location: [facility] OR ROOM 12 / [facility]   Surgeons: Cherre Robins, MD       DISCUSSION: Patient is a 50 year old female scheduled for the above procedure. She is s/p ligation of left basilic vein transposition AVF 01/04/21 for Steal Syndrome with insertion of right IJ TDC on 01/04/21. S/p first stage right basilic vein transposition 02/24/21.    History includes smoking (quit 12/04/18, but now smoking "on and off"), COPD, HTN, non-obstructive CAD (05/2015), CVA (09/04/14), morbid obesity, dyspnea, DM2 (with neuropathy, retinopathy, nephropathy), PTSD, Bipolar disorder, seizure (related to uncontrolled HTN/DM), GERD, gait disturbance, ESRD (HD started ~ 08/20/20; HD MWF), anemia. Positive UDS for cocaine and THC 01/19/19, but no current use documented.   Of note, on 06/01/15 she developed bradycardia followed by asystole during a Lexiscan nuclear stress test requiring brief CODE. Cardiac cath showed only 30% LCX disease, so response felt likely "excessive pharmacologic response to the agent and likely superimposed vasovagal response." Last cardiology evaluation was on 8/09/05/20 by Bernerd Pho, PA-C. No anginal symptoms.  One year follow-up planned.   Anesthesia team to evaluate on the day of surgery.    VS: Ht 5\' 4"  (1.626 m)   Wt 118.8 kg   LMP  (LMP Unknown)   BMI 44.97 kg/m  BP Readings from Last 3 Encounters:  04/19/21 (!) 166/74  03/08/21 (!) 142/77  02/25/21 119/67   Pulse Readings from Last 3 Encounters:  04/19/21 60  03/08/21 64  02/25/21 (!) 58     PROVIDERS: Rosita Fire, MD is PCP  Mikki Santee, MD is cardiologist [name], NP is endocrinology provider. Last visit 02/22/21. A1c 10.2%. She wrote, "Based on her hyperglycemia  overall, she is advised to increase her Levemir to 90 units SQ nightly and continue same dose of Novolog 10-16 units TID with meals if lgucose is above 90 and she is eating...continue monitoring blood glucose 4 times daily, before meals and before bed, and to call the clinic if she has readings less than 70 or greater than 300 for 3 tests in a row." Bhutani, Manpreet, MBBS is nephrologist   LABS: For day of surgery. Normal CBC on 02/25/21. A1c 10.2% 02/22/21.   IMAGES: 1V PCXR 01/04/21 (post TDC placement): FINDINGS: Right internal jugular catheter has its tip in the central to lower right atrium. No pneumothorax. There is cardiomegaly and pulmonary venous hypertension without frank edema.  IMPRESSION: Central line tip in the mid to low right atrium.  No pneumothorax.     EKG: 09/02/20: NSR     CV: Echo 01/20/19: IMPRESSIONS  1. The left ventricle has normal systolic function with an ejection fraction of 60-65%. The cavity size was normal. There is moderately increased left ventricular wall thickness. Left ventricular diastolic Doppler parameters are indeterminate.  2. The right ventricle has normal systolic function. The cavity was normal. There is no increase in right ventricular wall thickness.  3. Left atrial size was severely dilated.  4. Right atrial size was mildly dilated.  5. No evidence of mitral valve stenosis.  6. The aortic valve is tricuspid. No stenosis of the aortic valve.  7. The aorta is normal unless otherwise noted.  8. There is dilatation of the aortic root. (3.10 cm)  9. Pulmonary hypertension  is indeterminate, inadequate TR jet. 10. The inferior vena cava was dilated in size with >50% respiratory variability.     Cardiac cath 06/01/15: Prox Cx lesion, 30% stenosed. The coronary arteries are widely patent with minimal plaque noted. 40 cc of contrast was used Left ventricular end-diastolic pressure is mildly elevated. Left ventriculography was not performed in an  attempt to minimize contrast exposure. Asystole during adenosine infusion represents an excessive pharmacologic response to the agent and likely superimposed vasovagal response.     Carotid US 05/23/15: Summary: Intimal wall thickening CCA. Mild mixed plaque origin ICA. 1-39% ICA plaquing. Vertebral artery flow is antegrade.   Past Medical History:  Diagnosis Date   Anemia    Anxiety    Asystole ([place])    a. 05/2015: pt developed bradycardia->asystole during [place] nuclear stress test, s/p brief code. Cath with only 30% prox LCx. Her asystole during Lexiscan infusion was felt to represent an excessive pharmacologic response to the agent and likely superimposed vasovagal response.   Bipolar disorder ([place])    CKD (chronic kidney disease) stage 4, GFR 15-29 ml/min (HCC)    M_W_F dialysis   COPD (chronic obstructive pulmonary disease) (HCC)    Depression    Essential hypertension    GERD (gastroesophageal reflux disease)    History of blood transfusion 2011   History of seizure 2017   only one seizure - none since   History of stroke 09/2014   Acute lacunar infarct of the left thalamus   Leg cramps    Mild CAD    a. LHC 05/2015: 30% prox Cx, otherwise widely patent.   Morbid obesity ([place])    Neuromuscular disorder ([place])    neuropathy in feet   Neuropathy    feet   PTSD (post-traumatic stress disorder)    Stroke ([place])    x 2, right side weakness   Type 2 diabetes mellitus ([place])     Past Surgical History:  Procedure Laterality Date   AV FISTULA PLACEMENT Right 01/08/2017   Procedure: RIGHT ARM BRACHIOCEPHALIC ARTERIOVENOUS (AV) FISTULA CREATION;  Surgeon: Waynetta Sandy, MD;  Location: [place];  Service: Vascular;  Laterality: Right;   AV FISTULA PLACEMENT Left 11/20/2018   Procedure: Creation of LEFT ARM ARTERIOVENOUS (AV) FISTULA;  Surgeon: Waynetta Sandy, MD;  Location: [place];  Service: Vascular;  Laterality: Left;   AV FISTULA PLACEMENT Right 02/24/2021    Procedure: RIGHT UPPER ARM BASILIC VEIN ARTERIOVENOUS (AV) FISTULA CREATION;  Surgeon: Cherre Robins, MD;  Location: [place];  Service: Vascular;  Laterality: Right;   [place] Left 02/11/2019   Procedure: BASILIC VEIN TRANSPOSITION SECOND STAGE;  Surgeon: Waynetta Sandy, MD;  Location: [place];  Service: Vascular;  Laterality: Left;   CARDIAC CATHETERIZATION N/A 06/01/2015   Procedure: Left Heart Cath and Coronary Angiography;  Surgeon: Belva Crome, MD; CFX 30%, no other dz, EF nl by echo   CESAREAN SECTION     x2   DILATION AND CURETTAGE OF UTERUS     EYE SURGERY     Right eye pars plano vitrectomy    HEMATOMA EVACUATION Left 03/03/2019   Procedure: EVACUATION HEMATOMA;  Surgeon: Elam Dutch, MD;  Location: [place];  Service: Vascular;  Laterality: Left;   INSERTION OF DIALYSIS CATHETER Right 01/04/2021   Procedure: INSERTION OF RIGHT INTERNAL JUGULAR TUNNELED DIALYSIS CATHETER;  Surgeon: Elam Dutch, MD;  Location: [place];  Service: Vascular;  Laterality: Right;   LASIK  LIGATION OF ARTERIOVENOUS  FISTULA Left 01/04/2021   Procedure: LIGATION OF LEFT ARM ARTERIOVENOUS  FISTULA;  Surgeon: Elam Dutch, MD;  Location: [place];  Service: Vascular;  Laterality: Left;   PARS PLANA VITRECTOMY  04/20/2011   Procedure: PARS PLANA VITRECTOMY WITH 25 GAUGE;  Surgeon: Hayden Pedro, MD;  Location: [place];  Service: Ophthalmology;  Laterality: Left;  membrane peel, gas fluid exchange, endolaser, repair of complex retinal detachment   PATCH ANGIOPLASTY Left 10/05/2020   Procedure: PATCH ANGIOPLASTY USING Rueben Bash BIOLOGIC PATCH;  Surgeon: Elam Dutch, MD;  Location: [place];  Service: Vascular;  Laterality: Left;   PERIPHERAL VASCULAR BALLOON ANGIOPLASTY Left 09/02/2020   Procedure: PERIPHERAL VASCULAR BALLOON ANGIOPLASTY;  Surgeon: Marty Heck, MD;  Location: [facility];  Service: Cardiovascular;  Laterality: Left;  arm fistula   REVISON OF  ARTERIOVENOUS FISTULA Left 10/05/2020   Procedure: REVISION OF LEFT ARM ARTERIOVENOUS FISTULA;  Surgeon: Elam Dutch, MD;  Location: [facility];  Service: Vascular;  Laterality: Left;   TUBAL LIGATION      MEDICATIONS: No current facility-administered medications for this encounter.    albuterol (PROVENTIL HFA;VENTOLIN HFA) 108 (90 BASE) MCG/ACT inhaler   amLODipine (NORVASC) 10 MG tablet   atorvastatin (LIPITOR) 80 MG tablet   Cholecalciferol (VITAMIN D) 50 MCG (2000 UT) CAPS   cloNIDine (CATAPRES) 0.2 MG tablet   FLUoxetine (PROZAC) 10 MG tablet   FLUoxetine (PROZAC) 20 MG capsule   furosemide (LASIX) 80 MG tablet   gabapentin (NEURONTIN) 300 MG capsule   hydrALAZINE (APRESOLINE) 50 MG tablet   insulin aspart (NOVOLOG) 100 UNIT/ML FlexPen   insulin detemir (LEVEMIR) 100 UNIT/ML FlexPen   metoprolol succinate (TOPROL-XL) 50 MG 24 hr tablet   OLANZapine (ZYPREXA) 2.5 MG tablet   omeprazole (PRILOSEC) 20 MG capsule   sodium bicarbonate 650 MG tablet   traZODone (DESYREL) 100 MG tablet   Continuous Blood Gluc Receiver (DEXCOM G6 RECEIVER) DEVI   Lancets (ONETOUCH DELICA PLUS [id number]) MISC   multivitamin (RENA-VIT) TABS tablet   ONETOUCH VERIO test strip   oxyCODONE-acetaminophen (PERCOCET/ROXICET) 5-325 MG tablet    Myra Gianotti, PA-C Surgical Short Stay/Anesthesiology [facility] Phone [phone] [facility] Phone [phone] 05/04/2021 12:42 PM

## 2021-05-04 NOTE — Progress Notes (Signed)
DUE TO COVID-19 ONLY ONE VISITOR IS ALLOWED TO COME WITH YOU AND STAY IN THE WAITING ROOM ONLY DURING PRE OP AND PROCEDURE DAY OF SURGERY.   Cardiologist - Dr Rozann Lesches Endocrinology - [name], NP Internal Med - Dr Rosita Fire   Chest x-ray - 01/04/21 (1V) EKG - 09/02/20 Stress Test - 06/01/15 ECHO - 01/20/19 Cardiac Cath - 06/01/15  ICD Pacemaker/Loop - n/a  Sleep Study -  n/a CPAP - none  THE NIGHT BEFORE SURGERY, take 40 Units of Levemir Insulin.        THE MORNING OF SURGERY, do not take Novolog Insulin unless your CBG is greater than 220 mg/dL.  If CBG > 220 mg/dl, you may take  of your sliding scale (correction) dose of insulin.  If your blood sugar is less than 70 mg/dL, you will need to treat for low blood sugar: Treat a low blood sugar (less than 70 mg/dL) with  cup of clear juice (cranberry or apple), 4 glucose tablets, OR glucose gel. Recheck blood sugar in 15 minutes after treatment (to make sure it is greater than 70 mg/dL). If your blood sugar is not greater than 70 mg/dL on recheck, call [phone] for further instructions.  Anesthesia review: Yes  STOP now taking any Aspirin (unless otherwise instructed by your surgeon), Aleve, Naproxen, Ibuprofen, Motrin, Advil, Goody's, BC's, all herbal medications, fish oil, and all vitamins.   Coronavirus Screening Covid test n/a Ambulatory Surgery  Do you have any of the following symptoms:  Cough yes/no: No Fever (>100.15F)  yes/no: No Runny nose yes/no: No Sore throat yes/no: No Difficulty breathing/shortness of breath  yes/no: No  Have you traveled in the last 14 days and where? yes/no: No  Patient verbalized understanding of instructions that were given via phone.

## 2021-05-04 NOTE — Anesthesia Preprocedure Evaluation (Addendum)
Anesthesia Evaluation  Patient identified by MRN, date of birth, ID band Patient awake    Reviewed: Allergy & Precautions, NPO status , Patient's Chart, lab work & pertinent test results  Airway Mallampati: II  TM Distance: >3 FB Neck ROM: Full    Dental   Pulmonary COPD, Current Smoker,    breath sounds clear to auscultation       Cardiovascular hypertension, Pt. on medications and Pt. on home beta blockers + CAD and +CHF   Rhythm:Regular Rate:Normal     Neuro/Psych TIACVA    GI/Hepatic Neg liver ROS, GERD  ,  Endo/Other  diabetes, Type 2, Insulin DependentMorbid obesity  Renal/GU ESRF and DialysisRenal disease     Musculoskeletal   Abdominal   Peds  Hematology  (+) anemia ,   Anesthesia Other Findings   Reproductive/Obstetrics                            Anesthesia Physical Anesthesia Plan  ASA: 3  Anesthesia Plan: MAC and Regional   Post-op Pain Management: Regional block   Induction:   PONV Risk Score and Plan: 1 and Propofol infusion, Ondansetron and Treatment may vary due to age or medical condition  Airway Management Planned: Natural Airway and Simple Face Mask  Additional Equipment:   Intra-op Plan:   Post-operative Plan:   Informed Consent: I have reviewed the patients History and Physical, chart, labs and discussed the procedure including the risks, benefits and alternatives for the proposed anesthesia with the patient or authorized representative who has indicated his/her understanding and acceptance.       Plan Discussed with:   Anesthesia Plan Comments: ( )       Anesthesia Quick Evaluation

## 2021-05-05 ENCOUNTER — Ambulatory Visit (HOSPITAL_COMMUNITY): Payer: Medicare HMO | Admitting: Physician Assistant

## 2021-05-05 ENCOUNTER — Encounter (HOSPITAL_COMMUNITY)
Admission: RE | Disposition: A | Discharge status: Home / Self Care | Payer: Self-pay | Source: Home / Self Care | Attending: Vascular Surgery

## 2021-05-05 ENCOUNTER — Encounter (HOSPITAL_COMMUNITY): Payer: Self-pay | Admitting: Vascular Surgery

## 2021-05-05 ENCOUNTER — Ambulatory Visit (HOSPITAL_COMMUNITY)
Admission: RE | Admit: 2021-05-05 | Discharge: 2021-05-05 | Disposition: A | Discharge status: Home / Self Care | Payer: Medicare HMO | Attending: Vascular Surgery | Admitting: Vascular Surgery

## 2021-05-05 ENCOUNTER — Other Ambulatory Visit: Payer: Self-pay

## 2021-05-05 DIAGNOSIS — E1122 Type 2 diabetes mellitus with diabetic chronic kidney disease: Secondary | ICD-10-CM | POA: Diagnosis not present

## 2021-05-05 DIAGNOSIS — I509 Heart failure, unspecified: Secondary | ICD-10-CM | POA: Diagnosis not present

## 2021-05-05 DIAGNOSIS — I5031 Acute diastolic (congestive) heart failure: Secondary | ICD-10-CM | POA: Diagnosis not present

## 2021-05-05 DIAGNOSIS — D631 Anemia in chronic kidney disease: Secondary | ICD-10-CM | POA: Insufficient documentation

## 2021-05-05 DIAGNOSIS — I132 Hypertensive heart and chronic kidney disease with heart failure and with stage 5 chronic kidney disease, or end stage renal disease: Secondary | ICD-10-CM | POA: Insufficient documentation

## 2021-05-05 DIAGNOSIS — Z992 Dependence on renal dialysis: Secondary | ICD-10-CM | POA: Insufficient documentation

## 2021-05-05 DIAGNOSIS — F17200 Nicotine dependence, unspecified, uncomplicated: Secondary | ICD-10-CM | POA: Diagnosis not present

## 2021-05-05 DIAGNOSIS — R69 Illness, unspecified: Secondary | ICD-10-CM | POA: Diagnosis not present

## 2021-05-05 DIAGNOSIS — N186 End stage renal disease: Secondary | ICD-10-CM | POA: Insufficient documentation

## 2021-05-05 DIAGNOSIS — Z6841 Body Mass Index (BMI) 40.0 and over, adult: Secondary | ICD-10-CM | POA: Diagnosis not present

## 2021-05-05 DIAGNOSIS — Z794 Long term (current) use of insulin: Secondary | ICD-10-CM | POA: Diagnosis not present

## 2021-05-05 DIAGNOSIS — N182 Chronic kidney disease, stage 2 (mild): Secondary | ICD-10-CM | POA: Diagnosis not present

## 2021-05-05 HISTORY — PX: AV FISTULA PLACEMENT: SHX1204

## 2021-05-05 LAB — POCT PREGNANCY, URINE: Preg Test, Ur: NEGATIVE

## 2021-05-05 LAB — POCT I-STAT, CHEM 8
BUN: 39 mg/dL — ABNORMAL HIGH (ref 6–20)
Calcium, Ion: 0.93 mmol/L — ABNORMAL LOW (ref 1.15–1.40)
Chloride: 102 mmol/L (ref 98–111)
Creatinine, Ser: 4.9 mg/dL — ABNORMAL HIGH (ref 0.44–1.00)
Glucose, Bld: 216 mg/dL — ABNORMAL HIGH (ref 70–99)
HCT: 43 % (ref 36.0–46.0)
Hemoglobin: 14.6 g/dL (ref 12.0–15.0)
Potassium: 5.6 mmol/L — ABNORMAL HIGH (ref 3.5–5.1)
Sodium: 134 mmol/L — ABNORMAL LOW (ref 135–145)
TCO2: 26 mmol/L (ref 22–32)

## 2021-05-05 LAB — GLUCOSE, CAPILLARY: Glucose-Capillary: 217 mg/dL — ABNORMAL HIGH (ref 70–99)

## 2021-05-05 SURGERY — INSERTION OF ARTERIOVENOUS (AV) GORE-TEX GRAFT ARM
Anesthesia: Monitor Anesthesia Care | Site: Arm Lower | Laterality: Right

## 2021-05-05 MED ORDER — HEPARIN SODIUM (PORCINE) 1000 UNIT/ML IJ SOLN
1900.0000 [IU] | Freq: Once | INTRAMUSCULAR | Status: AC
  Administered 2021-05-05: 1900 [IU] via INTRAVENOUS

## 2021-05-05 MED ORDER — HEPARIN 6000 UNIT IRRIGATION SOLUTION
Status: DC | PRN
  Administered 2021-05-05: 1

## 2021-05-05 MED ORDER — HEPARIN SODIUM (PORCINE) 1000 UNIT/ML IJ SOLN
INTRAMUSCULAR | Status: AC
  Filled 2021-05-05: qty 10

## 2021-05-05 MED ORDER — PROPOFOL 10 MG/ML IV BOLUS
INTRAVENOUS | Status: DC | PRN
  Administered 2021-05-05: 10 mg via INTRAVENOUS
  Administered 2021-05-05: 30 mg via INTRAVENOUS
  Administered 2021-05-05: 10 mg via INTRAVENOUS

## 2021-05-05 MED ORDER — OXYCODONE-ACETAMINOPHEN 10-325 MG PO TABS: 1.0000 | ORAL_TABLET | Freq: Four times a day (QID) | ORAL | 0 refills | Status: DC | PRN

## 2021-05-05 MED ORDER — CHLORHEXIDINE GLUCONATE 4 % EX LIQD: 60.0000 mL | Freq: Once | CUTANEOUS | Status: DC

## 2021-05-05 MED ORDER — MIDAZOLAM HCL 2 MG/2ML IJ SOLN: 2.0000 mg | Freq: Once | INTRAMUSCULAR | Status: AC

## 2021-05-05 MED ORDER — ORAL CARE MOUTH RINSE: 15.0000 mL | Freq: Once | OROMUCOSAL | Status: AC

## 2021-05-05 MED ORDER — METOPROLOL SUCCINATE ER 25 MG PO TB24: 50.0000 mg | ORAL_TABLET | Freq: Once | ORAL | Status: AC

## 2021-05-05 MED ORDER — CHLORHEXIDINE GLUCONATE 0.12 % MT SOLN
15.0000 mL | Freq: Once | OROMUCOSAL | Status: AC
  Administered 2021-05-05: 15 mL via OROMUCOSAL
  Filled 2021-05-05: qty 15

## 2021-05-05 MED ORDER — ONDANSETRON HCL 4 MG/2ML IJ SOLN
INTRAMUSCULAR | Status: AC
  Filled 2021-05-05: qty 2

## 2021-05-05 MED ORDER — SODIUM CHLORIDE 0.9 % IV SOLN: INTRAVENOUS | Status: DC

## 2021-05-05 MED ORDER — MIDAZOLAM HCL 2 MG/2ML IJ SOLN
INTRAMUSCULAR | Status: AC
  Administered 2021-05-05: 2 mg via INTRAVENOUS
  Filled 2021-05-05: qty 2

## 2021-05-05 MED ORDER — ONDANSETRON HCL 4 MG/2ML IJ SOLN
INTRAMUSCULAR | Status: DC | PRN
  Administered 2021-05-05: 4 mg via INTRAVENOUS

## 2021-05-05 MED ORDER — PAPAVERINE HCL 30 MG/ML IJ SOLN
INTRAMUSCULAR | Status: AC
  Filled 2021-05-05: qty 2

## 2021-05-05 MED ORDER — METOPROLOL SUCCINATE ER 25 MG PO TB24
ORAL_TABLET | ORAL | Status: AC
  Administered 2021-05-05: 50 mg via ORAL
  Filled 2021-05-05: qty 2

## 2021-05-05 MED ORDER — HEPARIN 6000 UNIT IRRIGATION SOLUTION
Status: AC
  Filled 2021-05-05: qty 500

## 2021-05-05 MED ORDER — LIDOCAINE-EPINEPHRINE 1 %-1:100000 IJ SOLN
INTRAMUSCULAR | Status: AC
  Filled 2021-05-05: qty 1

## 2021-05-05 MED ORDER — FENTANYL CITRATE (PF) 100 MCG/2ML IJ SOLN: 25.0000 ug | INTRAMUSCULAR | Status: DC | PRN

## 2021-05-05 MED ORDER — LIDOCAINE-EPINEPHRINE (PF) 1.5 %-1:200000 IJ SOLN
INTRAMUSCULAR | Status: DC | PRN
  Administered 2021-05-05: 30 mL via PERINEURAL

## 2021-05-05 MED ORDER — PROPOFOL 500 MG/50ML IV EMUL
INTRAVENOUS | Status: DC | PRN
  Administered 2021-05-05: 150 ug/kg/min via INTRAVENOUS

## 2021-05-05 MED ORDER — SODIUM CHLORIDE 0.9 % IV SOLN: INTRAVENOUS | Status: DC | PRN

## 2021-05-05 MED ORDER — ACETAMINOPHEN 500 MG PO TABS
1000.0000 mg | ORAL_TABLET | Freq: Once | ORAL | Status: AC
  Administered 2021-05-05: 1000 mg via ORAL
  Filled 2021-05-05: qty 2

## 2021-05-05 MED ORDER — HEMOSTATIC AGENTS (NO CHARGE) OPTIME
TOPICAL | Status: DC | PRN
  Administered 2021-05-05: 1 via TOPICAL

## 2021-05-05 MED ORDER — FENTANYL CITRATE (PF) 100 MCG/2ML IJ SOLN
INTRAMUSCULAR | Status: AC
  Administered 2021-05-05: 50 ug via INTRAVENOUS
  Filled 2021-05-05: qty 2

## 2021-05-05 MED ORDER — FENTANYL CITRATE (PF) 100 MCG/2ML IJ SOLN: 50.0000 ug | Freq: Once | INTRAMUSCULAR | Status: AC

## 2021-05-05 MED ORDER — 0.9 % SODIUM CHLORIDE (POUR BTL) OPTIME
TOPICAL | Status: DC | PRN
  Administered 2021-05-05: 1000 mL

## 2021-05-05 MED ORDER — PROPOFOL 10 MG/ML IV BOLUS
INTRAVENOUS | Status: AC
  Filled 2021-05-05: qty 20

## 2021-05-05 MED ORDER — HEPARIN SODIUM (PORCINE) 1000 UNIT/ML IJ SOLN
INTRAMUSCULAR | Status: DC | PRN
  Administered 2021-05-05: 5000 [IU] via INTRAVENOUS

## 2021-05-05 MED ORDER — VANCOMYCIN HCL 1500 MG/300ML IV SOLN
1500.0000 mg | INTRAVENOUS | Status: AC
  Administered 2021-05-05: 1500 mg via INTRAVENOUS
  Filled 2021-05-05 (×2): qty 300

## 2021-05-05 SURGICAL SUPPLY — 42 items
ARMBAND PINK RESTRICT EXTREMIT (MISCELLANEOUS) ×3 IMPLANT
BAG COUNTER SPONGE SURGICOUNT (BAG) ×6 IMPLANT
CANISTER SUCT 3000ML PPV (MISCELLANEOUS) ×3 IMPLANT
CHLORAPREP W/TINT 26 (MISCELLANEOUS) ×3 IMPLANT
CLIP LIGATING EXTRA MED SLVR (CLIP) ×3 IMPLANT
CLIP LIGATING EXTRA SM BLUE (MISCELLANEOUS) ×3 IMPLANT
CLIP VESOCCLUDE MED 6/CT (CLIP) IMPLANT
CLIP VESOCCLUDE SM WIDE 24/CT (CLIP) IMPLANT
CLIP VESOCCLUDE SM WIDE 6/CT (CLIP) IMPLANT
COVER PROBE W GEL 5X96 (DRAPES) ×3 IMPLANT
DERMABOND ADVANCED (GAUZE/BANDAGES/DRESSINGS) ×1
DERMABOND ADVANCED .7 DNX12 (GAUZE/BANDAGES/DRESSINGS) ×2 IMPLANT
ELECT REM PT RETURN 9FT ADLT (ELECTROSURGICAL) ×3
ELECTRODE REM PT RTRN 9FT ADLT (ELECTROSURGICAL) ×2 IMPLANT
GAUZE 4X4 16PLY ~~LOC~~+RFID DBL (SPONGE) ×3 IMPLANT
GLOVE SRG 8 PF TXTR STRL LF DI (GLOVE) ×2 IMPLANT
GLOVE SURG ENC MOIS LTX SZ7.5 (GLOVE) ×3 IMPLANT
GLOVE SURG MICRO LTX SZ6 (GLOVE) ×9 IMPLANT
GLOVE SURG MICRO LTX SZ6.5 (GLOVE) ×3 IMPLANT
GLOVE SURG UNDER POLY LF SZ6.5 (GLOVE) ×6 IMPLANT
GLOVE SURG UNDER POLY LF SZ8 (GLOVE) ×1
GOWN STRL REUS W/ TWL LRG LVL3 (GOWN DISPOSABLE) ×6 IMPLANT
GOWN STRL REUS W/ TWL XL LVL3 (GOWN DISPOSABLE) ×2 IMPLANT
GOWN STRL REUS W/TWL LRG LVL3 (GOWN DISPOSABLE) ×3
GOWN STRL REUS W/TWL XL LVL3 (GOWN DISPOSABLE) ×1
GRAFT GORETEX STRT 4-7X45 (Vascular Products) ×3 IMPLANT
KIT BASIN OR (CUSTOM PROCEDURE TRAY) ×3 IMPLANT
KIT TURNOVER KIT B (KITS) ×3 IMPLANT
NS IRRIG 1000ML POUR BTL (IV SOLUTION) ×3 IMPLANT
PACK CV ACCESS (CUSTOM PROCEDURE TRAY) ×3 IMPLANT
PAD ARMBOARD 7.5X6 YLW CONV (MISCELLANEOUS) ×6 IMPLANT
SLING ARM FOAM STRAP LRG (SOFTGOODS) IMPLANT
SPONGE T-LAP 18X18 ~~LOC~~+RFID (SPONGE) ×3 IMPLANT
SUT MNCRL AB 4-0 PS2 18 (SUTURE) ×6 IMPLANT
SUT PROLENE 5 0 C 1 24 (SUTURE) ×3 IMPLANT
SUT PROLENE 6 0 BV (SUTURE) ×6 IMPLANT
SUT SILK 2 0 SH (SUTURE) IMPLANT
SUT VIC AB 3-0 SH 27 (SUTURE) ×2
SUT VIC AB 3-0 SH 27X BRD (SUTURE) ×4 IMPLANT
TOWEL GREEN STERILE (TOWEL DISPOSABLE) ×3 IMPLANT
UNDERPAD 30X36 HEAVY ABSORB (UNDERPADS AND DIAPERS) ×3 IMPLANT
WATER STERILE IRR 1000ML POUR (IV SOLUTION) ×3 IMPLANT

## 2021-05-05 NOTE — Interval H&P Note (Signed)
History and Physical Interval Note:  05/05/2021 9:46 AM  Samantha Pierce  has presented today for surgery, with the diagnosis of ESRD.  The various methods of treatment have been discussed with the patient and family. After consideration of risks, benefits and other options for treatment, the patient has consented to  Procedure(s) with comments: RIGHT SECOND STAGE BASILIC VEIN TRANSPOSITION (Right) - PERIPHERAL NERVE BLOCK as a surgical intervention.  The patient's history has been reviewed, patient examined, no change in status, stable for surgery.  I have reviewed the patient's chart and labs.  Questions were answered to the patient's satisfaction.     Cherre Robins

## 2021-05-05 NOTE — Anesthesia Postprocedure Evaluation (Signed)
Anesthesia Post Note  Patient: Samantha Pierce  Procedure(s) Performed: INSERTION OF ARTERIOVENOUS (AV) GORE-TEX GRAFT ARM (Right: Arm Lower)     Patient location during evaluation: PACU Anesthesia Type: Regional Level of consciousness: awake and alert Pain management: pain level controlled Vital Signs Assessment: post-procedure vital signs reviewed and stable Respiratory status: spontaneous breathing, nonlabored ventilation, respiratory function stable and patient connected to nasal cannula oxygen Cardiovascular status: stable and blood pressure returned to baseline Postop Assessment: no apparent nausea or vomiting Anesthetic complications: no   No notable events documented.  Last Vitals:  Vitals:   05/05/21 1202 05/05/21 1217  BP: 135/71 (!) 146/86  Pulse: 76 72  Resp: 10 14  Temp: 36.6 C 36.6 C  SpO2: 100% 100%    Last Pain:  Vitals:   05/05/21 1217  TempSrc:   PainSc: 0-No pain                 Tiajuana Amass

## 2021-05-05 NOTE — Anesthesia Procedure Notes (Signed)
Procedure Name: MAC Date/Time: 05/05/2021 10:12 AM Performed by: Claris Che, CRNA Pre-anesthesia Checklist: Patient identified, Emergency Drugs available, Suction available, Patient being monitored and Timeout performed Patient Re-evaluated:Patient Re-evaluated prior to induction Oxygen Delivery Method: Simple face mask Dental Injury: Teeth and Oropharynx as per pre-operative assessment

## 2021-05-05 NOTE — Op Note (Signed)
DATE OF SERVICE: 05/05/2021  PATIENT:  Samantha Pierce  50 y.o. female  PRE-OPERATIVE DIAGNOSIS:  ESRD  POST-OPERATIVE DIAGNOSIS:  Same  PROCEDURE:   conversion of right brachiobasilic arteriovenous to arteriovenous graft  SURGEON:  Surgeon(s) and Role:    * Cherre Robins, MD - Primary  ASSISTANT: Risa Grill, PA-C  An assistant was required to facilitate exposure and expedite the case.  ANESTHESIA:   local and MAC  EBL: 35m  BLOOD ADMINISTERED:none  DRAINS: none   LOCAL MEDICATIONS USED:  NONE  SPECIMEN:  none  COUNTS: confirmed correct.  TOURNIQUET:  none  PATIENT DISPOSITION:  PACU - hemodynamically stable.   Delay start of Pharmacological VTE agent (>24hrs) due to surgical blood loss or risk of bleeding: no  INDICATION FOR PROCEDURE: NTORA PRUNTYis a 50y.o. female with ESRD in need of permanent dialysis access. I created a right brachiobasilic arteriovenous fistula 02/24/21. Preoperative duplex suggested adequately maturing arteriovenous fistula After careful discussion of risks, benefits, and alternatives the patient was offered second stage basilic vein transposition. The patient understood and wished to proceed.  OPERATIVE FINDINGS: right arm arteriovenous fistula had long segment narrowing in the mid arm making second stage inappropriate. I converted the fistula to a graft using the proximal fistula as an inflow and the brachial vein as outflow.  DESCRIPTION OF PROCEDURE: After identification of the patient in the pre-operative holding area, the patient was transferred to the operating room. The patient was positioned supine on the operating room table. Anesthesia was induced. The right arm was prepped and draped in standard fashion. A surgical pause was performed confirming correct patient, procedure, and operative location.  The right brachiobasilic arteriovenous fistula was exposed by reopening the previous incision about the elbow. Incision was  carried down through subcutaneous tissue until the fistula was encountered.  The fistula was exposed and encircled.   The right brachial vein at the axilla was exposed using longitudinal incision just below the hairbearing area of the axilla.  Incision was carried down until the brachial sheath was encountered.  The brachial vein was identified, exposed, encircled with Silastic Vesseloops.   Using a curved, sheathed tunneling device, a 4-7 mm tapered Gore-Tex graft was tunneled subcutaneously and gentle arc across the biceps of the right arm.  Patient was then heparinized with 5000 units of IV heparin.   The brachiobasilic fistula was clamped proximally and distally. The fistula was divided with an 11 blade.  The 462maspect of the Gore-Tex graft was removed. The 87m61mroximal aspect was spatulated and then anastomosed end-to-side to the fistula using continuous running suture of 6-0 Prolene.  The anastomosis was completed and hemostasis ensured.  The graft was clamped.   The brachial vein was clamped proximally and distally.  An anterior venotomy was made with an 11 blade.  This was extended with Potts scissors.  The 7 mm end of the Gore-Tex graft was then anastomosed end to side to the brachial vein venotomy using continuous running suture of 6-0 Prolene.  Immediately prior to completion the anastomosis was de-aired and flushed.  Anastomosis was then completed.  Hemostasis was insured.   Doppler machine was brought onto the field to interrogate the graft.  Good doppler flow was noted in the radial artery.  About the arterial anastomosis flow was noted proximal and distal to the arterial anastomosis.  Distal to the venous anastomosis a Doppler bruit was heard.  Satisfied we ended the case here.   Surgical beds were  irrigated copiously.  Hemostasis was again ensured in the surgical beds.  The wounds were closed in layers using 3-0 Vicryl and 4-0 Monocryl.  Dermabond was applied.  Upon completion of the  case instrument and sharps counts were confirmed correct. The patient was transferred to the PACU in good condition. I was present for all portions of the procedure.  Yevonne Aline. Stanford Breed, MD [facility] Phone Number: [phone] 05/05/2021 11:53 AM

## 2021-05-05 NOTE — Transfer of Care (Signed)
Immediate Anesthesia Transfer of Care Note  Patient: TEMARI SCHOOLER  Procedure(s) Performed: INSERTION OF ARTERIOVENOUS (AV) GORE-TEX GRAFT ARM (Left: Arm Lower)  Patient Location: PACU  Anesthesia Type:MAC and Regional  Level of Consciousness: awake, alert  and oriented  Airway & Oxygen Therapy: Patient Spontanous Breathing  Post-op Assessment: Report given to RN  Post vital signs: Reviewed and stable  Last Vitals:  Vitals Value Taken Time  BP 152/128 05/05/21 1202  Temp    Pulse 78 05/05/21 1203  Resp 10 05/05/21 1203  SpO2 99 % 05/05/21 1203  Vitals shown include unvalidated device data.  Last Pain:  Vitals:   05/05/21 0813  TempSrc:   PainSc: 0-No pain         Complications: No notable events documented.

## 2021-05-05 NOTE — Discharge Instructions (Signed)
° °  [facility] ° °Discharge Instructions ° °AV Fistula or Graft Surgery for Dialysis Access ° °Please refer to the following instructions for your post-procedure care. Your surgeon or physician assistant will discuss any changes with you. ° °Activity ° °You may drive the day following your surgery, if you are comfortable and no longer taking prescription pain medication. Resume full activity as the soreness in your incision resolves. ° °Bathing/Showering ° °You may shower after you go home. Keep your incision dry for 48 hours. Do not soak in a bathtub, hot tub, or swim until the incision heals completely. You may not shower if you have a hemodialysis catheter. ° °Incision Care ° °Clean your incision with mild soap and water after 48 hours. Pat the area dry with a clean towel. You do not need a bandage unless otherwise instructed. Do not apply any ointments or creams to your incision. You may have skin glue on your incision. Do not peel it off. It will come off on its own in about one week. Your arm may swell a bit after surgery. To reduce swelling use pillows to elevate your arm so it is above your heart. Your doctor will tell you if you need to lightly wrap your arm with an ACE bandage. ° °Diet ° °Resume your normal diet. There are not special food restrictions following this procedure. In order to heal from your surgery, it is CRITICAL to get adequate nutrition. Your body requires vitamins, minerals, and protein. Vegetables are the best source of vitamins and minerals. Vegetables also provide the perfect balance of protein. Processed food has little nutritional value, so try to avoid this. ° °Medications ° °Resume taking all of your medications. If your incision is causing pain, you may take over-the counter pain relievers such as acetaminophen (Tylenol). If you were prescribed a stronger pain medication, please be aware these medications can cause nausea and constipation. Prevent  nausea by taking the medication with a snack or meal. Avoid constipation by drinking plenty of fluids and eating foods with high amount of fiber, such as fruits, vegetables, and grains. Do not take Tylenol if you are taking prescription pain medications. ° ° ° ° °Follow up °Your surgeon may want to see you in the office following your access surgery. If so, this will be arranged at the time of your surgery. ° °Please call us immediately for any of the following conditions: ° °Increased pain, redness, drainage (pus) from your incision site °Fever of 101 degrees or higher °Severe or worsening pain at your incision site °Hand pain or numbness. ° °Reduce your risk of vascular disease: ° °Stop smoking. If you would like help, call [facility] at 1-800-QUIT-NOW (1-800-784-8669) or [place] at 336-586-4000 ° °Manage your cholesterol °Maintain a desired weight °Control your diabetes °Keep your blood pressure down ° °Dialysis ° °It will take several weeks to several months for your new dialysis access to be ready for use. Your surgeon will determine when it is OK to use it. Your nephrologist will continue to direct your dialysis. You can continue to use your Permcath until your new access is ready for use. ° °If you have any questions, please call the office at [phone]. ° °

## 2021-05-05 NOTE — Anesthesia Procedure Notes (Signed)
Anesthesia Regional Block: Axillary brachial plexus block   Pre-Anesthetic Checklist: , timeout performed,  Correct Patient, Correct Site, Correct Laterality,  Correct Procedure, Correct Position, site marked,  Risks and benefits discussed,  Surgical consent,  Pre-op evaluation,  At surgeon's request and post-op pain management  Laterality: Right  Prep: chloraprep       Needles:  Injection technique: Single-shot  Needle Type: Echogenic Stimulator Needle     Needle Length: 9cm  Needle Gauge: 21     Additional Needles:   Procedures:, nerve stimulator,,, ultrasound used (permanent image in chart),,     Nerve Stimulator or Paresthesia:  Response: biceps, triceps, finger flexion, 0.5 mA  Additional Responses:   Narrative:  Start time: 05/05/2021 9:35 AM End time: 05/05/2021 9:42 AM Injection made incrementally with aspirations every 5 mL.  Performed by: Personally  Anesthesiologist: Suzette Battiest, MD

## 2021-05-05 NOTE — Progress Notes (Signed)
Patient awaiting ride discharge at this time no signs of distress noted

## 2021-05-06 ENCOUNTER — Encounter (HOSPITAL_COMMUNITY): Payer: Self-pay | Admitting: Vascular Surgery

## 2021-05-06 DIAGNOSIS — Z992 Dependence on renal dialysis: Secondary | ICD-10-CM | POA: Diagnosis not present

## 2021-05-06 DIAGNOSIS — N186 End stage renal disease: Secondary | ICD-10-CM | POA: Diagnosis not present

## 2021-05-09 DIAGNOSIS — Z992 Dependence on renal dialysis: Secondary | ICD-10-CM | POA: Diagnosis not present

## 2021-05-09 DIAGNOSIS — N186 End stage renal disease: Secondary | ICD-10-CM | POA: Diagnosis not present

## 2021-05-11 DIAGNOSIS — N186 End stage renal disease: Secondary | ICD-10-CM | POA: Diagnosis not present

## 2021-05-11 DIAGNOSIS — Z992 Dependence on renal dialysis: Secondary | ICD-10-CM | POA: Diagnosis not present

## 2021-05-13 DIAGNOSIS — Z992 Dependence on renal dialysis: Secondary | ICD-10-CM | POA: Diagnosis not present

## 2021-05-13 DIAGNOSIS — N186 End stage renal disease: Secondary | ICD-10-CM | POA: Diagnosis not present

## 2021-05-16 DIAGNOSIS — N186 End stage renal disease: Secondary | ICD-10-CM | POA: Diagnosis not present

## 2021-05-16 DIAGNOSIS — N2581 Secondary hyperparathyroidism of renal origin: Secondary | ICD-10-CM | POA: Diagnosis not present

## 2021-05-16 DIAGNOSIS — Z992 Dependence on renal dialysis: Secondary | ICD-10-CM | POA: Diagnosis not present

## 2021-05-18 DIAGNOSIS — Z992 Dependence on renal dialysis: Secondary | ICD-10-CM | POA: Diagnosis not present

## 2021-05-18 DIAGNOSIS — N186 End stage renal disease: Secondary | ICD-10-CM | POA: Diagnosis not present

## 2021-05-20 DIAGNOSIS — Z992 Dependence on renal dialysis: Secondary | ICD-10-CM | POA: Diagnosis not present

## 2021-05-20 DIAGNOSIS — N186 End stage renal disease: Secondary | ICD-10-CM | POA: Diagnosis not present

## 2021-05-20 DIAGNOSIS — E1165 Type 2 diabetes mellitus with hyperglycemia: Secondary | ICD-10-CM | POA: Diagnosis not present

## 2021-05-23 DIAGNOSIS — Z992 Dependence on renal dialysis: Secondary | ICD-10-CM | POA: Diagnosis not present

## 2021-05-23 DIAGNOSIS — N186 End stage renal disease: Secondary | ICD-10-CM | POA: Diagnosis not present

## 2021-05-24 ENCOUNTER — Ambulatory Visit (INDEPENDENT_AMBULATORY_CARE_PROVIDER_SITE_OTHER): Payer: Medicare HMO | Admitting: Nurse Practitioner

## 2021-05-24 ENCOUNTER — Other Ambulatory Visit: Payer: Self-pay

## 2021-05-24 ENCOUNTER — Encounter: Payer: Self-pay | Admitting: Nurse Practitioner

## 2021-05-24 VITALS — BP 115/71 | HR 66 | Ht 64.0 in | Wt 266.2 lb

## 2021-05-24 DIAGNOSIS — E782 Mixed hyperlipidemia: Secondary | ICD-10-CM

## 2021-05-24 DIAGNOSIS — E1165 Type 2 diabetes mellitus with hyperglycemia: Secondary | ICD-10-CM | POA: Diagnosis not present

## 2021-05-24 DIAGNOSIS — Z794 Long term (current) use of insulin: Secondary | ICD-10-CM | POA: Diagnosis not present

## 2021-05-24 DIAGNOSIS — I10 Essential (primary) hypertension: Secondary | ICD-10-CM | POA: Diagnosis not present

## 2021-05-24 LAB — POCT GLYCOSYLATED HEMOGLOBIN (HGB A1C): HbA1c, POC (controlled diabetic range): 9.3 % — AB (ref 0.0–7.0)

## 2021-05-24 MED ORDER — INSULIN DETEMIR 100 UNIT/ML FLEXPEN: 80.0000 [IU] | PEN_INJECTOR | Freq: Every day | SUBCUTANEOUS | Status: DC

## 2021-05-24 NOTE — Patient Instructions (Signed)

## 2021-05-24 NOTE — Progress Notes (Signed)
Endocrinology Follow Up Visit      05/24/2021, 1:30 PM   Subjective:    Patient ID: Samantha Pierce, female    DOB: 10-Dec-1970.  Samantha Pierce is being seen in follow up after being seen in consultation for management of currently uncontrolled symptomatic diabetes requested by  Rosita Fire, MD.   Past Medical History:  Diagnosis Date   Anemia    Anxiety    Asystole ([place])    a. 05/2015: pt developed bradycardia->asystole during [place] nuclear stress test, s/p brief code. Cath with only 30% prox LCx. Her asystole during Lexiscan infusion was felt to represent an excessive pharmacologic response to the agent and likely superimposed vasovagal response.   Bipolar disorder ([place])    CKD (chronic kidney disease) stage 4, GFR 15-29 ml/min (HCC)    M_W_F dialysis   COPD (chronic obstructive pulmonary disease) (HCC)    Depression    Essential hypertension    GERD (gastroesophageal reflux disease)    History of blood transfusion 2011   History of seizure 2017   only one seizure - none since   History of stroke 09/2014   Acute lacunar infarct of the left thalamus   Leg cramps    Mild CAD    a. LHC 05/2015: 30% prox Cx, otherwise widely patent.   Morbid obesity ([place])    Neuromuscular disorder ([place])    neuropathy in feet   Neuropathy    feet   PTSD (post-traumatic stress disorder)    Stroke ([place])    x 2, right side weakness   Type 2 diabetes mellitus ([place])     Past Surgical History:  Procedure Laterality Date   AV FISTULA PLACEMENT Right 01/08/2017   Procedure: RIGHT ARM BRACHIOCEPHALIC ARTERIOVENOUS (AV) FISTULA CREATION;  Surgeon: Waynetta Sandy, MD;  Location: [place];  Service: Vascular;  Laterality: Right;   AV FISTULA PLACEMENT Left 11/20/2018   Procedure: Creation of LEFT ARM ARTERIOVENOUS (AV) FISTULA;  Surgeon: Waynetta Sandy, MD;  Location: [place];  Service: Vascular;   Laterality: Left;   AV FISTULA PLACEMENT Right 02/24/2021   Procedure: RIGHT UPPER ARM BASILIC VEIN ARTERIOVENOUS (AV) FISTULA CREATION;  Surgeon: Cherre Robins, MD;  Location: [place];  Service: Vascular;  Laterality: Right;   AV FISTULA PLACEMENT Right 05/05/2021   Procedure: INSERTION OF ARTERIOVENOUS (AV) GORE-TEX GRAFT ARM;  Surgeon: Cherre Robins, MD;  Location: [place];  Service: Vascular;  Laterality: Right;   [place] Left 02/11/2019   Procedure: BASILIC VEIN TRANSPOSITION SECOND STAGE;  Surgeon: Waynetta Sandy, MD;  Location: [place];  Service: Vascular;  Laterality: Left;   CARDIAC CATHETERIZATION N/A 06/01/2015   Procedure: Left Heart Cath and Coronary Angiography;  Surgeon: Belva Crome, MD; CFX 30%, no other dz, EF nl by echo   CESAREAN SECTION     x2   DILATION AND CURETTAGE OF UTERUS     EYE SURGERY     Right eye pars plano vitrectomy    HEMATOMA EVACUATION Left 03/03/2019   Procedure: EVACUATION HEMATOMA;  Surgeon: Elam Dutch, MD;  Location: [place];  Service: Vascular;  Laterality: Left;   INSERTION OF DIALYSIS CATHETER  Right 01/04/2021   Procedure: INSERTION OF RIGHT INTERNAL JUGULAR TUNNELED DIALYSIS CATHETER;  Surgeon: Elam Dutch, MD;  Location: [facility];  Service: Vascular;  Laterality: Right;   LASIK     LIGATION OF ARTERIOVENOUS  FISTULA Left 01/04/2021   Procedure: LIGATION OF LEFT ARM ARTERIOVENOUS  FISTULA;  Surgeon: Elam Dutch, MD;  Location: [place];  Service: Vascular;  Laterality: Left;   PARS PLANA VITRECTOMY  04/20/2011   Procedure: PARS PLANA VITRECTOMY WITH 25 GAUGE;  Surgeon: Hayden Pedro, MD;  Location: [place];  Service: Ophthalmology;  Laterality: Left;  membrane peel, gas fluid exchange, endolaser, repair of complex retinal detachment   PATCH ANGIOPLASTY Left 10/05/2020   Procedure: PATCH ANGIOPLASTY USING Rueben Bash BIOLOGIC PATCH;  Surgeon: Elam Dutch, MD;  Location: [place];  Service: Vascular;  Laterality: Left;    PERIPHERAL VASCULAR BALLOON ANGIOPLASTY Left 09/02/2020   Procedure: PERIPHERAL VASCULAR BALLOON ANGIOPLASTY;  Surgeon: Marty Heck, MD;  Location: [place] [facility];  Service: Cardiovascular;  Laterality: Left;  arm fistula   REVISON OF ARTERIOVENOUS FISTULA Left 10/05/2020   Procedure: REVISION OF LEFT ARM ARTERIOVENOUS FISTULA;  Surgeon: Elam Dutch, MD;  Location[facility];  Service: Vascular;  Laterality: Left;   TUBAL LIGATION      Social History   Socioeconomic History   Marital status: Single    Spouse name: Not on file   Number of children: Not on file   Years of education: Not on file   Highest education level: Not on file  Occupational History   Not on file  Tobacco Use   Smoking status: Every Day    Packs/day: 0.03    Years: 30.00    Pack years: 0.90    Types: Cigarettes    Start date: 06/05/1981   Smokeless tobacco: Never   Tobacco comments:    Quit in 12/2018 but now smokes off and on per patient 05/04/21.  Vaping Use   Vaping Use: Never used  Substance and Sexual Activity   Alcohol use: Yes    Comment: occ wine   Drug use: No   Sexual activity: Yes    Birth control/protection: Surgical    Comment: Tubal Ligation  Other Topics Concern   Not on file  Social History Narrative   Not on file   Social Determinants of Health   Financial Resource Strain: Not on file  Food Insecurity: Not on file  Transportation Needs: Not on file  Physical Activity: Not on file  Stress: Not on file  Social Connections: Not on file    Family History  Problem Relation Age of Onset   Diabetes Mother    Kidney failure Mother    Diabetes Father    Amblyopia Neg Hx    Blindness Neg Hx    Cataracts Neg Hx    Glaucoma Neg Hx    Macular degeneration Neg Hx    Retinal detachment Neg Hx    Strabismus Neg Hx    Retinitis pigmentosa Neg Hx     Outpatient Encounter Medications as of 05/24/2021  Medication Sig   albuterol (PROVENTIL HFA;VENTOLIN HFA) 108 (90 BASE)  MCG/ACT inhaler Inhale 1-2 puffs into the lungs every 6 (six) hours as needed for wheezing or shortness of breath.   amLODipine (NORVASC) 10 MG tablet Take 1 tablet (10 mg total) by mouth daily.   atorvastatin (LIPITOR) 80 MG tablet Take 1 tablet (80 mg total) by mouth daily at 6 PM. (Patient taking differently:  Take 80 mg by mouth every morning.)   Cholecalciferol (VITAMIN D) 50 MCG (2000 UT) CAPS Take 2,000 Units by mouth daily.   cloNIDine (CATAPRES) 0.2 MG tablet Take 0.2 mg by mouth 2 (two) times daily.   FLUoxetine (PROZAC) 10 MG tablet Take 10 mg by mouth See admin instructions. Take with 20 mg for a total of 30 mg daily   FLUoxetine (PROZAC) 20 MG capsule Take 20 mg by mouth See admin instructions. Take with 10 mg for a total of 30 mg daily   furosemide (LASIX) 80 MG tablet Take 80 mg by mouth 2 (two) times daily with breakfast and lunch.   gabapentin (NEURONTIN) 300 MG capsule Take 1 capsule (300 mg total) by mouth at bedtime.   hydrALAZINE (APRESOLINE) 50 MG tablet Take 50 mg by mouth 2 (two) times daily.   insulin aspart (NOVOLOG) 100 UNIT/ML FlexPen Inject 10-16 Units into the skin 3 (three) times daily before meals. Sliding scale   Lancets (ONETOUCH DELICA PLUS [id number]) MISC Apply topically.   metoprolol succinate (TOPROL-XL) 50 MG 24 hr tablet Take 50 mg by mouth daily.   multivitamin (RENA-VIT) TABS tablet Take 1 tablet by mouth daily.   OLANZapine (ZYPREXA) 2.5 MG tablet Take 2.5 mg by mouth at bedtime.   omeprazole (PRILOSEC) 20 MG capsule Take 20 mg by mouth daily before breakfast.    ONETOUCH VERIO test strip SMARTSIG:Via Meter   oxyCODONE-acetaminophen (PERCOCET) 10-325 MG tablet Take 1 tablet by mouth every 6 (six) hours as needed for pain.   sodium bicarbonate 650 MG tablet Take 1,950 mg by mouth daily.   traZODone (DESYREL) 100 MG tablet Take 100 mg by mouth at bedtime.   [DISCONTINUED] insulin detemir (LEVEMIR) 100 UNIT/ML FlexPen Inject 90 Units into the skin at  bedtime. (Patient taking differently: Inject 80 Units into the skin at bedtime.)   Continuous Blood Gluc Receiver (DEXCOM G6 RECEIVER) DEVI USE TO CHECK GLUCOSE FOUR TIMES DAILY AS DIRECTED. (Patient not taking: Reported on 05/24/2021)   insulin detemir (LEVEMIR) 100 UNIT/ML FlexPen Inject 80 Units into the skin at bedtime.   [DISCONTINUED] FLUoxetine (PROZAC) 10 MG capsule Take by mouth. (Patient not taking: Reported on 05/24/2021)   No facility-administered encounter medications on file as of 05/24/2021.    ALLERGIES: Allergies  Allergen Reactions   Contrast Media [Iodinated Diagnostic Agents] Anaphylaxis    "code blue--I died"   Penicillins Rash and Other (See Comments)    Reaction: Childhood     VACCINATION STATUS: Immunization History  Administered Date(s) Administered   Influenza,trivalent, recombinat, inj, PF 07/05/2016   Influenza-Unspecified 08/26/2020   Moderna Sars-Covid-2 Vaccination 10/27/2019, 11/24/2019   Pneumococcal Polysaccharide-23 09/05/2014, 08/26/2020    Diabetes She presents for her follow-up diabetic visit. She has type 2 diabetes mellitus. Onset time: She was diagnosed at approximate age of 74. Her disease course has been improving. There are no hypoglycemic associated symptoms. Associated symptoms include blurred vision, fatigue and foot paresthesias. Pertinent negatives for diabetes include no polydipsia, no polyphagia and no polyuria. There are no hypoglycemic complications. Symptoms are stable. Diabetic complications include a CVA, heart disease, nephropathy, peripheral neuropathy and retinopathy. (Legally blind in right eye, says she was hospitalized for DKA once; now on HD) Risk factors for coronary artery disease include diabetes mellitus, dyslipidemia, hypertension, obesity, sedentary lifestyle and tobacco exposure. Current diabetic treatment includes intensive insulin program. She is compliant with treatment most of the time. Her weight is fluctuating  minimally. She is following a generally healthy diet. When  asked about meal planning, she reported none. She has not had a previous visit with a dietitian. She rarely participates in exercise. Her home blood glucose trend is fluctuating dramatically. (She presents today with her meter, no logs showing increasing glucose readings over past several weeks.  Her POCT A1c today is 9.3%, improving from last visit of 10.2%.  She does admit to eating things she knows she shouldn't over the holiday season.  She has had trouble getting her CGM to work properly (forgot her receiver to receive help from [place] today).  She denies any significant hypoglycemia.) An ACE inhibitor/angiotensin II receptor blocker is not being taken. She does not see a podiatrist.Eye exam is current.  Hyperlipidemia This is a chronic problem. The current episode started more than 1 year ago. The problem is uncontrolled. Recent lipid tests were reviewed and are high. Exacerbating diseases include chronic renal disease, diabetes and obesity. Factors aggravating her hyperlipidemia include beta blockers and fatty foods. Current antihyperlipidemic treatment includes statins. The current treatment provides moderate improvement of lipids. Compliance problems include adherence to diet and adherence to exercise.  Risk factors for coronary artery disease include diabetes mellitus, dyslipidemia, hypertension, obesity and a sedentary lifestyle.  Hypertension This is a chronic problem. The current episode started more than 1 year ago. The problem has been gradually improving since onset. The problem is controlled. Associated symptoms include blurred vision. There are no associated agents to hypertension. Risk factors for coronary artery disease include diabetes mellitus, dyslipidemia, obesity, sedentary lifestyle and smoking/tobacco exposure. Past treatments include calcium channel blockers, diuretics, central alpha agonists and beta blockers. The current  treatment provides mild improvement. There are no compliance problems.  Hypertensive end-organ damage includes kidney disease, CAD/MI, CVA, heart failure and retinopathy. Identifiable causes of hypertension include chronic renal disease.   Review of systems  Constitutional: + Minimally fluctuating body weight,  current Body mass index is 45.69 kg/m. , + fatigue, no subjective hyperthermia, no subjective hypothermia Eyes: + blurry vision (diabetic retinopathy and legally blind in Right eye), no xerophthalmia ENT: no sore throat, no nodules palpated in throat, no dysphagia/odynophagia, no hoarseness Cardiovascular: no chest pain, no shortness of breath, no palpitations, no leg swelling Respiratory: no cough, no shortness of breath Gastrointestinal: no nausea/vomiting/diarrhea Musculoskeletal: no muscle/joint aches Skin: no rashes, no hyperemia Neurological: no tremors, no dizziness, + numbness/tingling to BLE R>L Psychiatric: no depression, no anxiety   Objective:     BP 115/71    Pulse 66    Ht 5\' 4"  (1.626 m)    Wt 266 lb 3.2 oz (120.7 kg)    LMP  (LMP Unknown)    SpO2 100%    BMI 45.69 kg/m   Wt Readings from Last 3 Encounters:  05/24/21 266 lb 3.2 oz (120.7 kg)  05/05/21 260 lb (117.9 kg)  04/19/21 264 lb (119.7 kg)    BP Readings from Last 3 Encounters:  05/24/21 115/71  05/05/21 (!) 146/86  04/19/21 (!) 166/74     Physical Exam- Limited  Constitutional:  Body mass index is 45.69 kg/m. , not in acute distress, normal state of mind Eyes:  EOMI, no exophthalmos Neck: Supple Cardiovascular: RRR, no murmurs, rubs, or gallops, no edema Respiratory: Adequate breathing efforts, no crackles, rales, rhonchi, or wheezing Musculoskeletal: no gross deformities, strength intact in all four extremities, no gross restriction of joint movements Skin:  no rashes, no hyperemia Neurological: no tremor with outstretched hands    CMP ( most recent) CMP  Component Value Date/Time    NA 134 (L) 05/05/2021 0843   K 5.6 (H) 05/05/2021 0843   CL 102 05/05/2021 0843   CO2 24 02/25/2021 0712   GLUCOSE 216 (H) 05/05/2021 0843   BUN 39 (H) 05/05/2021 0843   CREATININE 4.90 (H) 05/05/2021 0843   CALCIUM 9.0 02/25/2021 0712   PROT 6.8 02/22/2019 1222   ALBUMIN 2.9 (L) 02/22/2019 1222   AST 16 02/22/2019 1222   ALT 13 02/22/2019 1222   ALKPHOS 60 02/22/2019 1222   BILITOT 0.6 02/22/2019 1222   GFRNONAA 10 (L) 02/25/2021 0712   GFRAA 12 (L) 03/04/2019 0616     Diabetic Labs (most recent): Lab Results  Component Value Date   HGBA1C 9.3 (A) 05/24/2021   HGBA1C 10.2 (A) 02/22/2021   HGBA1C 8.7 (H) 10/05/2020     Lipid Panel ( most recent) Lipid Panel     Component Value Date/Time   CHOL 245 (H) 05/23/2015 0545   TRIG 278 (H) 05/23/2015 0545   HDL 45 05/23/2015 0545   CHOLHDL 5.4 05/23/2015 0545   VLDL 56 (H) 05/23/2015 0545   LDLCALC 144 (H) 05/23/2015 0545      Lab Results  Component Value Date   TSH 1.524 09/04/2014           Assessment & Plan:   1) Uncontrolled diabetes mellitus with stage 4 chronic kidney disease ([place])  She presents today with her meter, no logs showing increasing glucose readings over past several weeks.  Her POCT A1c today is 9.3%, improving from last visit of 10.2%.  She does admit to eating things she knows she shouldn't over the holiday season.  She has had trouble getting her CGM to work properly (forgot her receiver to receive help from [place] today).  She denies any significant hypoglycemia.  - Samantha Pierce has currently uncontrolled symptomatic type 2 DM since 50 years of age.  -Recent labs reviewed.  - I had a long discussion with her about the progressive nature of diabetes and the pathology behind its complications. -her diabetes is complicated by CVA, retinopathy, neuropathy, CKD, and DKA x 1 episode and she remains at a high risk for more acute and chronic complications which include worsening CAD, CVA, CKD,  retinopathy, and neuropathy. These are all discussed in detail with her.  - Nutritional counseling repeated at each appointment due to patients tendency to fall back in to old habits.  - The patient admits there is a room for improvement in their diet and drink choices. -  Suggestion is made for the patient to avoid simple carbohydrates from their diet including Cakes, Sweet Desserts / Pastries, Ice Cream, Soda (diet and regular), Sweet Tea, Candies, Chips, Cookies, Sweet Pastries, Store Bought Juices, Alcohol in Excess of 1-2 drinks a day, Artificial Sweeteners, Coffee Creamer, and "Sugar-free" Products. This will help patient to have stable blood glucose profile and potentially avoid unintended weight gain.   - I encouraged the patient to switch to unprocessed or minimally processed complex starch and increased protein intake (animal or plant source), fruits, and vegetables.   - Patient is advised to stick to a routine mealtimes to eat 3 meals a day and avoid unnecessary snacks (to snack only to correct hypoglycemia).  - she will be scheduled with Jearld Fenton, RDN, CDE for diabetes education.  - I have approached her with the following individualized plan to manage  her diabetes and patient agrees:   -She is advised to continue her Levemir 80  units SQ nightly and continue same dose of Novolog 10-16 units TID with meals if lgucose is above 90 and she is eating (Specific instructions on how to titrate insulin dosage based on glucose readings given to patient in writing).   -she is encouraged to continue monitoring blood glucose 4 times daily, before meals and before bed, and to call the clinic if she has readings less than 70 or greater than 300 for 3 tests in a row.  I advised patient to bring her CGM to next appt and we can help her troubleshoot.  - she is warned not to take insulin without proper monitoring per orders. - Adjustment parameters are given to her for hypo and hyperglycemia in  writing.  - she is not a candidate for Metformin, SGLT2 due to concurrent renal insufficiency.  - she will be considered for incretin therapy as appropriate next visit.  - Specific targets for  A1c;  LDL, HDL,  and Triglycerides were discussed with the patient.  2) Blood Pressure /Hypertension:  her blood pressure is controlled to target.   she is advised to continue her current medications including Norvasc 10 mg po daily, Clonidine 0.2 mg po daily, Lasix 80 mg po twice daily, Hydralazine 50 mg po twice daily and Metoprolol 50 mg  mg p.o. daily with breakfast.  Will defer any med changes to nephrology.  3) Lipids/Hyperlipidemia:    There is no recent lipid panel to review.  She is advised to continue Lipitor 80 mg po daily before bed.  Side effects and precautions discussed with her.  Will recheck lipid panel prior to next visit.   4)  Weight/Diet:  her Body mass index is 45.69 kg/m.  -  clearly complicating her diabetes care.   she is a candidate for weight loss. I discussed with her the fact that loss of 5 - 10% of her  current body weight will have the most impact on her diabetes management.  Exercise, and detailed carbohydrates information provided  -  detailed on discharge instructions.  5) Chronic Care/Health Maintenance: -she is not on ACEI/ARB and is on Statin medications and is encouraged to initiate and continue to follow up with Ophthalmology, Dentist, Podiatrist at least yearly or according to recommendations, and advised to stay away from smoking. I have recommended yearly flu vaccine and pneumonia vaccine at least every 5 years; moderate intensity exercise for up to 150 minutes weekly; and sleep for at least 7 hours a day.  - she is advised to maintain close follow up with Rosita Fire, MD for primary care needs, as well as her other providers for optimal and coordinated care.     I spent 30 minutes in the care of the patient today including review of labs from [place], Lipids,  Thyroid Function, Hematology (current and previous including abstractions from other facilities); face-to-face time discussing  her blood glucose readings/logs, discussing hypoglycemia and hyperglycemia episodes and symptoms, medications doses, her options of short and long term treatment based on the latest standards of care / guidelines;  discussion about incorporating lifestyle medicine;  and documenting the encounter.    Please refer to Patient Instructions for Blood Glucose Monitoring and Insulin/Medications Dosing Guide"  in media tab for additional information. Please  also refer to " Patient Self Inventory" in the Media  tab for reviewed elements of pertinent patient history.  Samantha Pierce participated in the discussions, expressed understanding, and voiced agreement with the above plans.  All questions were answered to  her satisfaction. she is encouraged to contact clinic should she have any questions or concerns prior to her return visit.   Follow up plan: - Return in about 3 months (around 08/22/2021) for Diabetes F/U with A1c in office, Previsit labs, Bring meter and logs.  [name], [facility] [address] [place], [place] [address] Phone: [phone] Fax: [phone]  05/24/2021, 1:30 PM

## 2021-05-25 DIAGNOSIS — Z992 Dependence on renal dialysis: Secondary | ICD-10-CM | POA: Diagnosis not present

## 2021-05-25 DIAGNOSIS — N186 End stage renal disease: Secondary | ICD-10-CM | POA: Diagnosis not present

## 2021-05-27 DIAGNOSIS — N186 End stage renal disease: Secondary | ICD-10-CM | POA: Diagnosis not present

## 2021-05-27 DIAGNOSIS — Z992 Dependence on renal dialysis: Secondary | ICD-10-CM | POA: Diagnosis not present

## 2021-05-30 DIAGNOSIS — N186 End stage renal disease: Secondary | ICD-10-CM | POA: Diagnosis not present

## 2021-05-30 DIAGNOSIS — Z992 Dependence on renal dialysis: Secondary | ICD-10-CM | POA: Diagnosis not present

## 2021-06-01 DIAGNOSIS — N186 End stage renal disease: Secondary | ICD-10-CM | POA: Diagnosis not present

## 2021-06-01 DIAGNOSIS — Z992 Dependence on renal dialysis: Secondary | ICD-10-CM | POA: Diagnosis not present

## 2021-06-03 DIAGNOSIS — Z992 Dependence on renal dialysis: Secondary | ICD-10-CM | POA: Diagnosis not present

## 2021-06-03 DIAGNOSIS — N186 End stage renal disease: Secondary | ICD-10-CM | POA: Diagnosis not present

## 2021-06-04 DIAGNOSIS — Z992 Dependence on renal dialysis: Secondary | ICD-10-CM | POA: Diagnosis not present

## 2021-06-04 DIAGNOSIS — N186 End stage renal disease: Secondary | ICD-10-CM | POA: Diagnosis not present

## 2021-06-06 DIAGNOSIS — N186 End stage renal disease: Secondary | ICD-10-CM | POA: Diagnosis not present

## 2021-06-06 DIAGNOSIS — Z992 Dependence on renal dialysis: Secondary | ICD-10-CM | POA: Diagnosis not present

## 2021-06-07 ENCOUNTER — Other Ambulatory Visit: Payer: Self-pay

## 2021-06-07 ENCOUNTER — Ambulatory Visit (INDEPENDENT_AMBULATORY_CARE_PROVIDER_SITE_OTHER): Payer: Medicare HMO | Admitting: Physician Assistant

## 2021-06-07 VITALS — BP 129/73 | HR 63 | Temp 97.4°F | Ht 64.0 in | Wt 265.1 lb

## 2021-06-07 DIAGNOSIS — Z992 Dependence on renal dialysis: Secondary | ICD-10-CM

## 2021-06-07 DIAGNOSIS — N186 End stage renal disease: Secondary | ICD-10-CM

## 2021-06-07 NOTE — Progress Notes (Signed)
° ° °  Postoperative Access Visit   History of Present Illness   Samantha Pierce is a 51 y.o. year old female who presents for postoperative follow-up for: Right AV Graft on 05/05/21 by Dr. Stanford Breed. She previously had a right brachiobasilic AV fistula created on 02/24/21. Duplex showed adequate maturation, however in surgery it was found that the vein had a long segment of narrowing in the mid arm making it not suitable for use. As result her fistula was converted to a graft using the proximal fistula as inflow and the brachial vein as outflow.  The patient's wounds are well healed.  The patient notes no steal symptoms.  The patient is  able to complete their activities of daily living.  She is still having some discomfort in her left upper arm. She describes an aching pain near her shoulder that has been present for several  months now. Her left AVF was ligated on 01/04/21 secondary to steal. Prior to this she explains she was suppose to have shoulder surgery but her renal function was too poor to undergo anesthesia. She is unsure if her pain is shoulder related or her dialysis access. She has been scheduled [facility] x[facility] by her PCP  She currently is dialyzing on MWF via right IJ TDC at the [place] location  Physical Examination   Vitals:   06/07/21 1104  BP: 129/73  Pulse: 63  Temp: (!) 97.4 F (36.3 C)  TempSrc: Oral  SpO2: (!) 18%  Weight: 265 lb 1.6 oz (120.2 kg)  Height: 5\' 4"  (1.626 m)  PF: 99 L/min   Body mass index is 45.5 kg/m.  right arm Incisions are well healed, 2+ radial pulse, hand grip is 5/5, sensation in digits is intact, palpable thrill, bruit can be auscultated     Medical Decision Making   Samantha Pierce is a 51 y.o. year old female who presents s/p Right AV Graft on 05/05/21 by Dr. Stanford Breed. She previously had a right brachiobasilic AV fistula created on 02/24/21. Duplex showed adequate maturation, however in surgery it was found that the vein had a long  segment of narrowing in the mid arm making it not suitable for use. As result her fistula was converted to a graft using the proximal fistula as inflow and the brachial vein as outflow.  The patient's wounds are well healed.  The patient notes no steal symptoms.  The patient's access will be ready for use starting 52/4/22 The patient's tunneled dialysis catheter can be removed when Nephrology is comfortable with the performance of the right AV graft The patient may follow up on a prn basis   Karoline Caldwell, PA-C [facility] Office: [facility] MD: Roxanne Mins

## 2021-06-07 NOTE — H&P (View-Only) (Signed)
° ° °  Postoperative Access Visit   History of Present Illness   Samantha Pierce is a 51 y.o. year old female who presents for postoperative follow-up for: Right AV Graft on 05/05/21 by Dr. Stanford Breed. She previously had a right brachiobasilic AV fistula created on 02/24/21. Duplex showed adequate maturation, however in surgery it was found that the vein had a long segment of narrowing in the mid arm making it not suitable for use. As result her fistula was converted to a graft using the proximal fistula as inflow and the brachial vein as outflow.  The patient's wounds are well healed.  The patient notes no steal symptoms.  The patient is  able to complete their activities of daily living.  She is still having some discomfort in her left upper arm. She describes an aching pain near her shoulder that has been present for several  months now. Her left AVF was ligated on 01/04/21 secondary to steal. Prior to this she explains she was suppose to have shoulder surgery but her renal function was too poor to undergo anesthesia. She is unsure if her pain is shoulder related or her dialysis access. She has been scheduled for xray at [facility] by her PCP  She currently is dialyzing on MWF via right IJ TDC at the [place] location  Physical Examination   Vitals:   06/07/21 1104  BP: 129/73  Pulse: 63  Temp: (!) 97.4 F (36.3 C)  TempSrc: Oral  SpO2: (!) 18%  Weight: 265 lb 1.6 oz (120.2 kg)  Height: 5\' 4"  (1.626 m)  PF: 99 L/min   Body mass index is 45.5 kg/m.  right arm Incisions are well healed, 2+ radial pulse, hand grip is 5/5, sensation in digits is intact, palpable thrill, bruit can be auscultated     Medical Decision Making   Samantha Pierce is a 51 y.o. year old female who presents s/p Right AV Graft on 05/05/21 by Dr. Stanford Breed. She previously had a right brachiobasilic AV fistula created on 02/24/21. Duplex showed adequate maturation, however in surgery it was found that the vein had a long  segment of narrowing in the mid arm making it not suitable for use. As result her fistula was converted to a graft using the proximal fistula as inflow and the brachial vein as outflow.  The patient's wounds are well healed.  The patient notes no steal symptoms.  The patient's access will be ready for use starting 06/08/20 The patient's tunneled dialysis catheter can be removed when Nephrology is comfortable with the performance of the right AV graft The patient may follow up on a prn basis   Karoline Caldwell, PA-C [facility] Office: [facility] MD: Roxanne Mins

## 2021-06-08 DIAGNOSIS — Z992 Dependence on renal dialysis: Secondary | ICD-10-CM | POA: Diagnosis not present

## 2021-06-08 DIAGNOSIS — N186 End stage renal disease: Secondary | ICD-10-CM | POA: Diagnosis not present

## 2021-06-10 DIAGNOSIS — Z992 Dependence on renal dialysis: Secondary | ICD-10-CM | POA: Diagnosis not present

## 2021-06-10 DIAGNOSIS — N186 End stage renal disease: Secondary | ICD-10-CM | POA: Diagnosis not present

## 2021-06-13 DIAGNOSIS — Z992 Dependence on renal dialysis: Secondary | ICD-10-CM | POA: Diagnosis not present

## 2021-06-13 DIAGNOSIS — N186 End stage renal disease: Secondary | ICD-10-CM | POA: Diagnosis not present

## 2021-06-13 DIAGNOSIS — D509 Iron deficiency anemia, unspecified: Secondary | ICD-10-CM | POA: Diagnosis not present

## 2021-06-13 DIAGNOSIS — N2581 Secondary hyperparathyroidism of renal origin: Secondary | ICD-10-CM | POA: Diagnosis not present

## 2021-06-13 DIAGNOSIS — E119 Type 2 diabetes mellitus without complications: Secondary | ICD-10-CM | POA: Diagnosis not present

## 2021-06-13 DIAGNOSIS — Z794 Long term (current) use of insulin: Secondary | ICD-10-CM | POA: Diagnosis not present

## 2021-06-15 ENCOUNTER — Telehealth: Payer: Self-pay

## 2021-06-15 ENCOUNTER — Other Ambulatory Visit: Payer: Self-pay

## 2021-06-15 DIAGNOSIS — N186 End stage renal disease: Secondary | ICD-10-CM | POA: Diagnosis not present

## 2021-06-15 DIAGNOSIS — Z992 Dependence on renal dialysis: Secondary | ICD-10-CM | POA: Diagnosis not present

## 2021-06-15 NOTE — Telephone Encounter (Signed)
Ramiro Harvest, PA from HD center called regarding pt needing catheter removal scheduled since she has a functioning graft that was placed last month. She prefers this to be done at [facility] hospital. Surgery scheduler has been made aware.

## 2021-06-17 DIAGNOSIS — N186 End stage renal disease: Secondary | ICD-10-CM | POA: Diagnosis not present

## 2021-06-17 DIAGNOSIS — Z992 Dependence on renal dialysis: Secondary | ICD-10-CM | POA: Diagnosis not present

## 2021-06-20 DIAGNOSIS — Z992 Dependence on renal dialysis: Secondary | ICD-10-CM | POA: Diagnosis not present

## 2021-06-20 DIAGNOSIS — N186 End stage renal disease: Secondary | ICD-10-CM | POA: Diagnosis not present

## 2021-06-20 DIAGNOSIS — E1165 Type 2 diabetes mellitus with hyperglycemia: Secondary | ICD-10-CM | POA: Diagnosis not present

## 2021-06-21 ENCOUNTER — Ambulatory Visit (HOSPITAL_COMMUNITY)
Admission: RE | Admit: 2021-06-21 | Discharge: 2021-06-21 | Disposition: A | Discharge status: Home / Self Care | Payer: Medicare HMO | Attending: Vascular Surgery | Admitting: Vascular Surgery

## 2021-06-21 ENCOUNTER — Encounter (HOSPITAL_COMMUNITY)
Admission: RE | Disposition: A | Discharge status: Home / Self Care | Payer: Self-pay | Source: Home / Self Care | Attending: Vascular Surgery

## 2021-06-21 DIAGNOSIS — Z452 Encounter for adjustment and management of vascular access device: Secondary | ICD-10-CM | POA: Diagnosis not present

## 2021-06-21 DIAGNOSIS — N186 End stage renal disease: Secondary | ICD-10-CM | POA: Diagnosis not present

## 2021-06-21 DIAGNOSIS — Z4901 Encounter for fitting and adjustment of extracorporeal dialysis catheter: Secondary | ICD-10-CM | POA: Diagnosis not present

## 2021-06-21 HISTORY — PX: REMOVAL OF A DIALYSIS CATHETER: SHX6053

## 2021-06-21 LAB — GLUCOSE, CAPILLARY: Glucose-Capillary: 110 mg/dL — ABNORMAL HIGH (ref 70–99)

## 2021-06-21 SURGERY — REMOVAL, DIALYSIS CATHETER
Anesthesia: LOCAL

## 2021-06-21 MED ORDER — LIDOCAINE HCL (PF) 1 % IJ SOLN
INTRAMUSCULAR | Status: AC
  Filled 2021-06-21: qty 30

## 2021-06-21 MED ORDER — LIDOCAINE HCL (PF) 1 % IJ SOLN
INTRAMUSCULAR | Status: DC | PRN
  Administered 2021-06-21: 8 mL

## 2021-06-21 SURGICAL SUPPLY — 20 items
APPLICATOR CHLORAPREP 10.5 ORG (MISCELLANEOUS) ×2 IMPLANT
CLOTH BEACON ORANGE TIMEOUT ST (SAFETY) ×2 IMPLANT
DECANTER SPIKE VIAL GLASS SM (MISCELLANEOUS) ×2 IMPLANT
DERMABOND ADVANCED (GAUZE/BANDAGES/DRESSINGS) ×1
DERMABOND ADVANCED .7 DNX12 (GAUZE/BANDAGES/DRESSINGS) ×1 IMPLANT
DRAPE HALF SHEET 40X57 (DRAPES) IMPLANT
ELECT REM PT RETURN 9FT ADLT (ELECTROSURGICAL) ×2
ELECTRODE REM PT RTRN 9FT ADLT (ELECTROSURGICAL) ×1 IMPLANT
GLOVE SURG POLYISO LF SZ7.5 (GLOVE) IMPLANT
GLOVE SURG UNDER POLY LF SZ7 (GLOVE) IMPLANT
GOWN STRL REUS W/TWL LRG LVL3 (GOWN DISPOSABLE) IMPLANT
NDL HYPO 25X1 1.5 SAFETY (NEEDLE) ×1 IMPLANT
NEEDLE HYPO 25X1 1.5 SAFETY (NEEDLE) ×2 IMPLANT
PENCIL SMOKE EVACUATOR COATED (MISCELLANEOUS) IMPLANT
SPONGE GAUZE 2X2 8PLY STRL LF (GAUZE/BANDAGES/DRESSINGS) ×2 IMPLANT
SUT MNCRL AB 4-0 PS2 18 (SUTURE) ×2 IMPLANT
SUT VIC AB 3-0 SH 27 (SUTURE) ×1
SUT VIC AB 3-0 SH 27X BRD (SUTURE) ×1 IMPLANT
SYR CONTROL 10ML LL (SYRINGE) ×2 IMPLANT
TOWEL OR 17X26 4PK STRL BLUE (TOWEL DISPOSABLE) ×2 IMPLANT

## 2021-06-21 NOTE — Interval H&P Note (Signed)
History and Physical Interval Note:  06/21/2021 11:27 AM  Samantha Pierce  has presented today for surgery, with the diagnosis of ESRD.  The various methods of treatment have been discussed with the patient and family. After consideration of risks, benefits and other options for treatment, the patient has consented to  Procedure(s): MINOR REMOVAL OF TUNNELED DIALYSIS CATHETER (N/A) as a surgical intervention.  The patient's history has been reviewed, patient examined, no change in status, stable for surgery.  I have reviewed the patient's chart and labs.  Questions were answered to the patient's satisfaction.     Curt Jews

## 2021-06-21 NOTE — Op Note (Signed)
° ° °  OPERATIVE REPORT  DATE OF SURGERY: 06/21/2021  PATIENT: Samantha Pierce, 51 y.o. female MRN: [id number]  DOB: 11-13-1970  PRE-OPERATIVE DIAGNOSIS: End-stage renal disease with functioning AV Gore-Tex graft right arm  POST-OPERATIVE DIAGNOSIS:  Same  PROCEDURE: Removal of right IJ tunneled hemodialysis catheter  SURGEON:  Curt Jews, M.D.  PHYSICIAN ASSISTANT: None  The assistant was needed for exposure and to expedite the case  ANESTHESIA: 1% lidocaine local  EBL: per anesthesia record  No intake/output data recorded.  BLOOD ADMINISTERED: none  DRAINS: none  SPECIMEN: none  COUNTS CORRECT:  YES  PATIENT DISPOSITION:  PACU - hemodynamically stable  PROCEDURE DETAILS: The area of the catheter and right chest were prepped draped you sterile fashion.  1% lidocaine local was infiltrated around the exit site of the catheter.  Blunt dissection was used to free up the catheter cuff when the catheter was removed in its entirety.  Hemostasis was held with obtained with pressure.  Sterile dressing was applied the patient was discharged to home   Rosetta Posner, M.D., [facility] 06/21/2021 12:16 PM  Note: Portions of this report may have been transcribed using voice recognition software.  Every effort has been made to ensure accuracy; however, inadvertent computerized transcription errors may still be present.

## 2021-06-22 ENCOUNTER — Encounter (HOSPITAL_COMMUNITY): Payer: Self-pay | Admitting: Vascular Surgery

## 2021-06-22 DIAGNOSIS — Z992 Dependence on renal dialysis: Secondary | ICD-10-CM | POA: Diagnosis not present

## 2021-06-22 DIAGNOSIS — N186 End stage renal disease: Secondary | ICD-10-CM | POA: Diagnosis not present

## 2021-06-27 DIAGNOSIS — N186 End stage renal disease: Secondary | ICD-10-CM | POA: Diagnosis not present

## 2021-06-27 DIAGNOSIS — N2581 Secondary hyperparathyroidism of renal origin: Secondary | ICD-10-CM | POA: Diagnosis not present

## 2021-06-27 DIAGNOSIS — Z992 Dependence on renal dialysis: Secondary | ICD-10-CM | POA: Diagnosis not present

## 2021-06-29 DIAGNOSIS — N186 End stage renal disease: Secondary | ICD-10-CM | POA: Diagnosis not present

## 2021-06-29 DIAGNOSIS — Z992 Dependence on renal dialysis: Secondary | ICD-10-CM | POA: Diagnosis not present

## 2021-06-30 ENCOUNTER — Other Ambulatory Visit: Payer: Self-pay

## 2021-06-30 ENCOUNTER — Ambulatory Visit: Payer: Medicare HMO

## 2021-07-01 DIAGNOSIS — Z992 Dependence on renal dialysis: Secondary | ICD-10-CM | POA: Diagnosis not present

## 2021-07-01 DIAGNOSIS — N186 End stage renal disease: Secondary | ICD-10-CM | POA: Diagnosis not present

## 2021-07-04 DIAGNOSIS — N186 End stage renal disease: Secondary | ICD-10-CM | POA: Diagnosis not present

## 2021-07-04 DIAGNOSIS — Z992 Dependence on renal dialysis: Secondary | ICD-10-CM | POA: Diagnosis not present

## 2021-07-05 DIAGNOSIS — N186 End stage renal disease: Secondary | ICD-10-CM | POA: Diagnosis not present

## 2021-07-05 DIAGNOSIS — Z992 Dependence on renal dialysis: Secondary | ICD-10-CM | POA: Diagnosis not present

## 2021-07-06 DIAGNOSIS — Z992 Dependence on renal dialysis: Secondary | ICD-10-CM | POA: Diagnosis not present

## 2021-07-06 DIAGNOSIS — N186 End stage renal disease: Secondary | ICD-10-CM | POA: Diagnosis not present

## 2021-07-08 DIAGNOSIS — Z992 Dependence on renal dialysis: Secondary | ICD-10-CM | POA: Diagnosis not present

## 2021-07-08 DIAGNOSIS — N186 End stage renal disease: Secondary | ICD-10-CM | POA: Diagnosis not present

## 2021-07-11 DIAGNOSIS — N186 End stage renal disease: Secondary | ICD-10-CM | POA: Diagnosis not present

## 2021-07-11 DIAGNOSIS — Z992 Dependence on renal dialysis: Secondary | ICD-10-CM | POA: Diagnosis not present

## 2021-07-12 DIAGNOSIS — F331 Major depressive disorder, recurrent, moderate: Secondary | ICD-10-CM | POA: Diagnosis not present

## 2021-07-12 DIAGNOSIS — F411 Generalized anxiety disorder: Secondary | ICD-10-CM | POA: Diagnosis not present

## 2021-07-12 DIAGNOSIS — R69 Illness, unspecified: Secondary | ICD-10-CM | POA: Diagnosis not present

## 2021-07-13 DIAGNOSIS — N186 End stage renal disease: Secondary | ICD-10-CM | POA: Diagnosis not present

## 2021-07-13 DIAGNOSIS — Z992 Dependence on renal dialysis: Secondary | ICD-10-CM | POA: Diagnosis not present

## 2021-07-15 DIAGNOSIS — Z992 Dependence on renal dialysis: Secondary | ICD-10-CM | POA: Diagnosis not present

## 2021-07-15 DIAGNOSIS — N186 End stage renal disease: Secondary | ICD-10-CM | POA: Diagnosis not present

## 2021-07-18 DIAGNOSIS — N186 End stage renal disease: Secondary | ICD-10-CM | POA: Diagnosis not present

## 2021-07-18 DIAGNOSIS — N2581 Secondary hyperparathyroidism of renal origin: Secondary | ICD-10-CM | POA: Diagnosis not present

## 2021-07-18 DIAGNOSIS — Z992 Dependence on renal dialysis: Secondary | ICD-10-CM | POA: Diagnosis not present

## 2021-07-20 DIAGNOSIS — Z992 Dependence on renal dialysis: Secondary | ICD-10-CM | POA: Diagnosis not present

## 2021-07-20 DIAGNOSIS — N186 End stage renal disease: Secondary | ICD-10-CM | POA: Diagnosis not present

## 2021-07-21 DIAGNOSIS — E1165 Type 2 diabetes mellitus with hyperglycemia: Secondary | ICD-10-CM | POA: Diagnosis not present

## 2021-07-22 DIAGNOSIS — N186 End stage renal disease: Secondary | ICD-10-CM | POA: Diagnosis not present

## 2021-07-22 DIAGNOSIS — Z992 Dependence on renal dialysis: Secondary | ICD-10-CM | POA: Diagnosis not present

## 2021-07-22 LAB — BASIC METABOLIC PANEL: BUN: 31 — AB (ref 4–21)

## 2021-07-22 LAB — CBC AND DIFFERENTIAL: Hemoglobin: 12.2 (ref 12.0–16.0)

## 2021-07-22 LAB — COMPREHENSIVE METABOLIC PANEL: Calcium: 9.1 (ref 8.7–10.7)

## 2021-07-25 DIAGNOSIS — N186 End stage renal disease: Secondary | ICD-10-CM | POA: Diagnosis not present

## 2021-07-25 DIAGNOSIS — Z992 Dependence on renal dialysis: Secondary | ICD-10-CM | POA: Diagnosis not present

## 2021-07-27 DIAGNOSIS — N186 End stage renal disease: Secondary | ICD-10-CM | POA: Diagnosis not present

## 2021-07-27 DIAGNOSIS — Z992 Dependence on renal dialysis: Secondary | ICD-10-CM | POA: Diagnosis not present

## 2021-07-29 DIAGNOSIS — N186 End stage renal disease: Secondary | ICD-10-CM | POA: Diagnosis not present

## 2021-07-29 DIAGNOSIS — Z992 Dependence on renal dialysis: Secondary | ICD-10-CM | POA: Diagnosis not present

## 2021-08-01 DIAGNOSIS — N186 End stage renal disease: Secondary | ICD-10-CM | POA: Diagnosis not present

## 2021-08-01 DIAGNOSIS — N2581 Secondary hyperparathyroidism of renal origin: Secondary | ICD-10-CM | POA: Diagnosis not present

## 2021-08-01 DIAGNOSIS — Z992 Dependence on renal dialysis: Secondary | ICD-10-CM | POA: Diagnosis not present

## 2021-08-02 DIAGNOSIS — Z992 Dependence on renal dialysis: Secondary | ICD-10-CM | POA: Diagnosis not present

## 2021-08-02 DIAGNOSIS — N186 End stage renal disease: Secondary | ICD-10-CM | POA: Diagnosis not present

## 2021-08-03 DIAGNOSIS — Z992 Dependence on renal dialysis: Secondary | ICD-10-CM | POA: Diagnosis not present

## 2021-08-03 DIAGNOSIS — N186 End stage renal disease: Secondary | ICD-10-CM | POA: Diagnosis not present

## 2021-08-08 DIAGNOSIS — N186 End stage renal disease: Secondary | ICD-10-CM | POA: Diagnosis not present

## 2021-08-08 DIAGNOSIS — Z992 Dependence on renal dialysis: Secondary | ICD-10-CM | POA: Diagnosis not present

## 2021-08-10 DIAGNOSIS — Z992 Dependence on renal dialysis: Secondary | ICD-10-CM | POA: Diagnosis not present

## 2021-08-10 DIAGNOSIS — N186 End stage renal disease: Secondary | ICD-10-CM | POA: Diagnosis not present

## 2021-08-12 ENCOUNTER — Other Ambulatory Visit: Payer: Self-pay | Admitting: Physician Assistant

## 2021-08-12 DIAGNOSIS — N186 End stage renal disease: Secondary | ICD-10-CM | POA: Diagnosis not present

## 2021-08-12 DIAGNOSIS — Z992 Dependence on renal dialysis: Secondary | ICD-10-CM | POA: Diagnosis not present

## 2021-08-15 DIAGNOSIS — N2581 Secondary hyperparathyroidism of renal origin: Secondary | ICD-10-CM | POA: Diagnosis not present

## 2021-08-15 DIAGNOSIS — Z992 Dependence on renal dialysis: Secondary | ICD-10-CM | POA: Diagnosis not present

## 2021-08-15 DIAGNOSIS — N186 End stage renal disease: Secondary | ICD-10-CM | POA: Diagnosis not present

## 2021-08-16 ENCOUNTER — Telehealth: Payer: Self-pay | Admitting: Nurse Practitioner

## 2021-08-16 NOTE — Telephone Encounter (Signed)
Called pt to remind her to do labs for her appt next week. Patient states she is not doing her blood work, she only wants to do A1C in office. Please Advise. ?

## 2021-08-16 NOTE — Telephone Encounter (Signed)
If she has had blood work done elsewhere, that is fine, we just need to obtain a copy for our records.  It is important for routine labs to be done to assess and treat chronic health conditions properly and safely.

## 2021-08-16 NOTE — Telephone Encounter (Signed)
That'll work.

## 2021-08-16 NOTE — Telephone Encounter (Signed)
Called the patient and she stated that she is going through homelessness currently and she stated that she has had some labs done at dialysis and she will have those faxed to [place]. ?

## 2021-08-18 DIAGNOSIS — N186 End stage renal disease: Secondary | ICD-10-CM | POA: Diagnosis not present

## 2021-08-18 DIAGNOSIS — E1142 Type 2 diabetes mellitus with diabetic polyneuropathy: Secondary | ICD-10-CM | POA: Diagnosis not present

## 2021-08-18 DIAGNOSIS — E1122 Type 2 diabetes mellitus with diabetic chronic kidney disease: Secondary | ICD-10-CM | POA: Diagnosis not present

## 2021-08-18 DIAGNOSIS — I10 Essential (primary) hypertension: Secondary | ICD-10-CM | POA: Diagnosis not present

## 2021-08-19 DIAGNOSIS — N186 End stage renal disease: Secondary | ICD-10-CM | POA: Diagnosis not present

## 2021-08-19 DIAGNOSIS — Z992 Dependence on renal dialysis: Secondary | ICD-10-CM | POA: Diagnosis not present

## 2021-08-22 DIAGNOSIS — Z992 Dependence on renal dialysis: Secondary | ICD-10-CM | POA: Diagnosis not present

## 2021-08-22 DIAGNOSIS — N186 End stage renal disease: Secondary | ICD-10-CM | POA: Diagnosis not present

## 2021-08-23 ENCOUNTER — Ambulatory Visit: Payer: Medicare HMO | Admitting: Nurse Practitioner

## 2021-08-23 NOTE — Patient Instructions (Incomplete)

## 2021-08-24 DIAGNOSIS — N186 End stage renal disease: Secondary | ICD-10-CM | POA: Diagnosis not present

## 2021-08-24 DIAGNOSIS — Z992 Dependence on renal dialysis: Secondary | ICD-10-CM | POA: Diagnosis not present

## 2021-08-25 DIAGNOSIS — E559 Vitamin D deficiency, unspecified: Secondary | ICD-10-CM | POA: Diagnosis not present

## 2021-08-25 DIAGNOSIS — Z0001 Encounter for general adult medical examination with abnormal findings: Secondary | ICD-10-CM | POA: Diagnosis not present

## 2021-08-25 DIAGNOSIS — E113591 Type 2 diabetes mellitus with proliferative diabetic retinopathy without macular edema, right eye: Secondary | ICD-10-CM | POA: Diagnosis not present

## 2021-08-25 DIAGNOSIS — I10 Essential (primary) hypertension: Secondary | ICD-10-CM | POA: Diagnosis not present

## 2021-08-25 LAB — HEMOGLOBIN A1C: Hemoglobin A1C: 9.7

## 2021-08-26 DIAGNOSIS — N186 End stage renal disease: Secondary | ICD-10-CM | POA: Diagnosis not present

## 2021-08-26 DIAGNOSIS — Z992 Dependence on renal dialysis: Secondary | ICD-10-CM | POA: Diagnosis not present

## 2021-08-29 DIAGNOSIS — N186 End stage renal disease: Secondary | ICD-10-CM | POA: Diagnosis not present

## 2021-08-29 DIAGNOSIS — Z992 Dependence on renal dialysis: Secondary | ICD-10-CM | POA: Diagnosis not present

## 2021-08-29 DIAGNOSIS — N2581 Secondary hyperparathyroidism of renal origin: Secondary | ICD-10-CM | POA: Diagnosis not present

## 2021-08-30 ENCOUNTER — Encounter: Payer: Self-pay | Admitting: Nurse Practitioner

## 2021-08-30 ENCOUNTER — Ambulatory Visit (INDEPENDENT_AMBULATORY_CARE_PROVIDER_SITE_OTHER): Payer: Medicare HMO | Admitting: Nurse Practitioner

## 2021-08-30 VITALS — BP 161/71 | HR 72 | Ht 64.0 in | Wt 266.8 lb

## 2021-08-30 DIAGNOSIS — E1165 Type 2 diabetes mellitus with hyperglycemia: Secondary | ICD-10-CM

## 2021-08-30 DIAGNOSIS — Z794 Long term (current) use of insulin: Secondary | ICD-10-CM | POA: Diagnosis not present

## 2021-08-30 DIAGNOSIS — I10 Essential (primary) hypertension: Secondary | ICD-10-CM | POA: Diagnosis not present

## 2021-08-30 DIAGNOSIS — E782 Mixed hyperlipidemia: Secondary | ICD-10-CM

## 2021-08-30 MED ORDER — INSULIN DETEMIR 100 UNIT/ML FLEXPEN: 60.0000 [IU] | PEN_INJECTOR | Freq: Every day | SUBCUTANEOUS | Status: DC

## 2021-08-30 MED ORDER — INSULIN ASPART 100 UNIT/ML FLEXPEN: 10.0000 [IU] | PEN_INJECTOR | Freq: Three times a day (TID) | SUBCUTANEOUS | 3 refills | Status: DC

## 2021-08-30 MED ORDER — OZEMPIC (0.25 OR 0.5 MG/DOSE) 2 MG/1.5ML ~~LOC~~ SOPN: 0.5000 mg | PEN_INJECTOR | SUBCUTANEOUS | 3 refills | Status: DC

## 2021-08-30 NOTE — Progress Notes (Signed)
? ?                                                    Endocrinology Follow Up Visit ?     08/30/2021, 2:51 PM ? ? ?Subjective:  ? ? Patient ID: Samantha Pierce, female    DOB: 11-23-1970.  ?Samantha Pierce is being seen in follow up after being seen in consultation for management of currently uncontrolled symptomatic diabetes requested by  Carrolyn Meiers, MD. ? ? ?Past Medical History:  ?Diagnosis Date  ? Anemia   ? Anxiety   ? Asystole ([place])   ? a. 05/2015: pt developed bradycardia->asystole during [place] nuclear stress test, s/p brief code. Cath with only 30% prox LCx. Her asystole during Lexiscan infusion was felt to represent an excessive pharmacologic response to the agent and likely superimposed vasovagal response.  ? Bipolar disorder ([place])   ? CKD (chronic kidney disease) stage 4, GFR 15-29 ml/min (HCC)   ? M_W_F dialysis  ? COPD (chronic obstructive pulmonary disease) ([place])   ? Depression   ? Essential hypertension   ? GERD (gastroesophageal reflux disease)   ? History of blood transfusion 2011  ? History of seizure 2017  ? only one seizure - none since  ? History of stroke 09/2014  ? Acute lacunar infarct of the left thalamus  ? Leg cramps   ? Mild CAD   ? a. LHC 05/2015: 30% prox Cx, otherwise widely patent.  ? Morbid obesity ([place])   ? Neuromuscular disorder ([place])   ? neuropathy in feet  ? Neuropathy   ? feet  ? PTSD (post-traumatic stress disorder)   ? Stroke [facility])   ? x 2, right side weakness  ? Type 2 diabetes mellitus ([place])   ? ? ?Past Surgical History:  ?Procedure Laterality Date  ? AV FISTULA PLACEMENT Right 01/08/2017  ? Procedure: RIGHT ARM BRACHIOCEPHALIC ARTERIOVENOUS (AV) FISTULA CREATION;  Surgeon: Waynetta Sandy, MD;  Location: [place];  Service: Vascular;  Laterality: Right;  ? AV FISTULA PLACEMENT Left 11/20/2018  ? Procedure: Creation of LEFT ARM ARTERIOVENOUS (AV) FISTULA;  Surgeon: Waynetta Sandy, MD;  Location: [place];  Service: Vascular;   Laterality: Left;  ? AV FISTULA PLACEMENT Right 02/24/2021  ? Procedure: RIGHT UPPER ARM BASILIC VEIN ARTERIOVENOUS (AV) FISTULA CREATION;  Surgeon: Cherre Robins, MD;  Location: [facility];  Service: Vascular;  Laterality: Right;  ? AV FISTULA PLACEMENT Right 05/05/2021  ? Procedure: INSERTION OF ARTERIOVENOUS (AV) GORE-TEX GRAFT ARM;  Surgeon: Cherre Robins, MD;  Location: [place];  Service: Vascular;  Laterality: Right;  ? BASCILIC VEIN TRANSPOSITION Left 02/11/2019  ? Procedure: BASILIC VEIN TRANSPOSITION SECOND STAGE;  Surgeon: Waynetta Sandy, MD;  Location: [place];  Service: Vascular;  Laterality: Left;  ? CARDIAC CATHETERIZATION N/A 06/01/2015  ? Procedure: Left Heart Cath and Coronary Angiography;  Surgeon: Belva Crome, MD; CFX 30%, no other dz, EF nl by echo  ? CESAREAN SECTION    ? x2  ? DILATION AND CURETTAGE OF UTERUS    ? EYE SURGERY    ? Right eye pars plano vitrectomy   ? HEMATOMA EVACUATION Left 03/03/2019  ? Procedure: EVACUATION HEMATOMA;  Surgeon: Elam Dutch, MD;  Location: [facility] OR;  Service: Vascular;  Laterality: Left;  ? INSERTION OF DIALYSIS  CATHETER Right 01/04/2021  ? Procedure: INSERTION OF RIGHT INTERNAL JUGULAR TUNNELED DIALYSIS CATHETER;  Surgeon: Elam Dutch, MD;  Location: [facility] OR;  Service: Vascular;  Laterality: Right;  ? LASIK    ? LIGATION OF ARTERIOVENOUS  FISTULA Left 01/04/2021  ? Procedure: LIGATION OF LEFT ARM ARTERIOVENOUS  FISTULA;  Surgeon: Elam Dutch, MD;  Location: [place];  Service: Vascular;  Laterality: Left;  ? PARS PLANA VITRECTOMY  04/20/2011  ? Procedure: PARS PLANA VITRECTOMY WITH 25 GAUGE;  Surgeon: Hayden Pedro, MD;  Location: [place];  Service: Ophthalmology;  Laterality: Left;  membrane peel, gas fluid exchange, endolaser, repair of complex retinal detachment  ? PATCH ANGIOPLASTY Left 10/05/2020  ? Procedure: PATCH ANGIOPLASTY USING Rueben Bash BIOLOGIC PATCH;  Surgeon: Elam Dutch, MD;  Location: [place];  Service: Vascular;  Laterality: Left;   ? PERIPHERAL VASCULAR BALLOON ANGIOPLASTY Left 09/02/2020  ? Procedure: PERIPHERAL VASCULAR BALLOON ANGIOPLASTY;  Surgeon: Marty Heck, MD;  Location: [place] [facility];  Service: Cardiovascular;  Laterality: Left;  arm fistula  ? REMOVAL OF A DIALYSIS CATHETER N/A 06/21/2021  ? Procedure: MINOR REMOVAL OF TUNNELED DIALYSIS CATHETER;  Surgeon: Rosetta Posner, MD;  Location: [facility];  Service: Vascular;  Laterality: N/A;  ? REVISON OF ARTERIOVENOUS FISTULA Left 10/05/2020  ? Procedure: REVISION OF LEFT ARM ARTERIOVENOUS FISTULA;  Surgeon: Elam Dutch, MD;  Location: [facility] OR;  Service: Vascular;  Laterality: Left;  ? TUBAL LIGATION    ? ? ?Social History  ? ?Socioeconomic History  ? Marital status: Single  ?  Spouse name: Not on file  ? Number of children: Not on file  ? Years of education: Not on file  ? Highest education level: Not on file  ?Occupational History  ? Not on file  ?Tobacco Use  ? Smoking status: Every Day  ?  Packs/day: 0.03  ?  Years: 30.00  ?  Pack years: 0.90  ?  Types: Cigarettes  ?  Start date: 06/05/1981  ? Smokeless tobacco: Never  ? Tobacco comments:  ?  Quit in 12/2018 but now smokes off and on per patient 05/04/21.  ?Vaping Use  ? Vaping Use: Never used  ?Substance and Sexual Activity  ? Alcohol use: Yes  ?  Comment: occ wine  ? Drug use: No  ? Sexual activity: Yes  ?  Birth control/protection: Surgical  ?  Comment: Tubal Ligation  ?Other Topics Concern  ? Not on file  ?Social History Narrative  ? Not on file  ? ?Social Determinants of Health  ? ?Financial Resource Strain: Not on file  ?Food Insecurity: Not on file  ?Transportation Needs: Not on file  ?Physical Activity: Not on file  ?Stress: Not on file  ?Social Connections: Not on file  ? ? ?Family History  ?Problem Relation Age of Onset  ? Diabetes Mother   ? Kidney failure Mother   ? Diabetes Father   ? Amblyopia Neg Hx   ? Blindness Neg Hx   ? Cataracts Neg Hx   ? Glaucoma Neg Hx   ? Macular degeneration Neg Hx   ? Retinal  detachment Neg Hx   ? Strabismus Neg Hx   ? Retinitis pigmentosa Neg Hx   ? ? ?Outpatient Encounter Medications as of 08/30/2021  ?Medication Sig  ? albuterol (PROVENTIL HFA;VENTOLIN HFA) 108 (90 BASE) MCG/ACT inhaler Inhale 1-2 puffs into the lungs every 6 (six) hours as needed for wheezing or shortness of breath.  ? amLODipine (  NORVASC) 10 MG tablet Take 1 tablet (10 mg total) by mouth daily.  ? atorvastatin (LIPITOR) 80 MG tablet Take 1 tablet (80 mg total) by mouth daily at 6 PM. (Patient taking differently: Take 80 mg by mouth at bedtime.)  ? cloNIDine (CATAPRES) 0.2 MG tablet Take 0.2 mg by mouth 2 (two) times daily.  ? FLUoxetine (PROZAC) 10 MG tablet Take 10 mg by mouth See admin instructions. Take with 20 mg for a total of 30 mg daily  ? FLUoxetine (PROZAC) 20 MG capsule Take 20 mg by mouth See admin instructions. Take with 10 mg for a total of 30 mg daily  ? furosemide (LASIX) 80 MG tablet Take 80 mg by mouth 2 (two) times daily with breakfast and lunch.  ? gabapentin (NEURONTIN) 300 MG capsule TAKE (1) CAPSULE BY MOUTH DAILY.  ? hydrALAZINE (APRESOLINE) 50 MG tablet Take 50 mg by mouth 2 (two) times daily.  ? hydrOXYzine (ATARAX) 10 MG tablet Take 10 mg by mouth 3 (three) times daily as needed.  ? Lancets (ONETOUCH DELICA PLUS [id number]) MISC Apply topically.  ? metoprolol succinate (TOPROL-XL) 50 MG 24 hr tablet Take 50 mg by mouth daily.  ? OLANZapine (ZYPREXA) 2.5 MG tablet Take 2.5 mg by mouth at bedtime.  ? omeprazole (PRILOSEC) 20 MG capsule Take 20 mg by mouth daily before breakfast.   ? ONETOUCH VERIO test strip SMARTSIG:Via Meter  ? Semaglutide,0.25 or 0.5MG /DOS, (OZEMPIC, 0.25 OR 0.5 MG/DOSE,) 2 MG/1.5ML SOPN Inject 0.5 mg into the skin once a week.  ? sodium bicarbonate 650 MG tablet Take 650-1,300 mg by mouth See admin instructions. Take 1300 mg in the morning and 650 mg at bedtime  ? traZODone (DESYREL) 100 MG tablet Take 100 mg by mouth at bedtime.  ? [DISCONTINUED] insulin aspart (NOVOLOG)  100 UNIT/ML FlexPen Inject 0-16 Units into the skin 3 (three) times daily before meals. Under  ?Sliding scale  ? [DISCONTINUED] insulin detemir (LEVEMIR) 100 UNIT/ML FlexPen Inject 80 Units into the skin at b

## 2021-08-30 NOTE — Patient Instructions (Signed)

## 2021-08-31 DIAGNOSIS — Z992 Dependence on renal dialysis: Secondary | ICD-10-CM | POA: Diagnosis not present

## 2021-08-31 DIAGNOSIS — N186 End stage renal disease: Secondary | ICD-10-CM | POA: Diagnosis not present

## 2021-09-02 DIAGNOSIS — Z992 Dependence on renal dialysis: Secondary | ICD-10-CM | POA: Diagnosis not present

## 2021-09-02 DIAGNOSIS — N186 End stage renal disease: Secondary | ICD-10-CM | POA: Diagnosis not present

## 2021-09-05 DIAGNOSIS — N186 End stage renal disease: Secondary | ICD-10-CM | POA: Diagnosis not present

## 2021-09-05 DIAGNOSIS — Z992 Dependence on renal dialysis: Secondary | ICD-10-CM | POA: Diagnosis not present

## 2021-09-07 DIAGNOSIS — N186 End stage renal disease: Secondary | ICD-10-CM | POA: Diagnosis not present

## 2021-09-07 DIAGNOSIS — Z992 Dependence on renal dialysis: Secondary | ICD-10-CM | POA: Diagnosis not present

## 2021-09-08 ENCOUNTER — Other Ambulatory Visit (HOSPITAL_COMMUNITY): Payer: Self-pay | Admitting: Internal Medicine

## 2021-09-08 DIAGNOSIS — Z1231 Encounter for screening mammogram for malignant neoplasm of breast: Secondary | ICD-10-CM

## 2021-09-08 DIAGNOSIS — I10 Essential (primary) hypertension: Secondary | ICD-10-CM | POA: Diagnosis not present

## 2021-09-08 DIAGNOSIS — N186 End stage renal disease: Secondary | ICD-10-CM | POA: Diagnosis not present

## 2021-09-08 DIAGNOSIS — Z1331 Encounter for screening for depression: Secondary | ICD-10-CM | POA: Diagnosis not present

## 2021-09-08 DIAGNOSIS — Z0001 Encounter for general adult medical examination with abnormal findings: Secondary | ICD-10-CM | POA: Diagnosis not present

## 2021-09-08 DIAGNOSIS — Z1389 Encounter for screening for other disorder: Secondary | ICD-10-CM | POA: Diagnosis not present

## 2021-09-08 DIAGNOSIS — E113591 Type 2 diabetes mellitus with proliferative diabetic retinopathy without macular edema, right eye: Secondary | ICD-10-CM | POA: Diagnosis not present

## 2021-09-09 ENCOUNTER — Other Ambulatory Visit: Payer: Self-pay | Admitting: Vascular Surgery

## 2021-09-09 DIAGNOSIS — Z992 Dependence on renal dialysis: Secondary | ICD-10-CM | POA: Diagnosis not present

## 2021-09-09 DIAGNOSIS — N186 End stage renal disease: Secondary | ICD-10-CM | POA: Diagnosis not present

## 2021-09-12 DIAGNOSIS — Z992 Dependence on renal dialysis: Secondary | ICD-10-CM | POA: Diagnosis not present

## 2021-09-12 DIAGNOSIS — N186 End stage renal disease: Secondary | ICD-10-CM | POA: Diagnosis not present

## 2021-09-12 DIAGNOSIS — E119 Type 2 diabetes mellitus without complications: Secondary | ICD-10-CM | POA: Diagnosis not present

## 2021-09-14 DIAGNOSIS — N186 End stage renal disease: Secondary | ICD-10-CM | POA: Diagnosis not present

## 2021-09-14 DIAGNOSIS — Z992 Dependence on renal dialysis: Secondary | ICD-10-CM | POA: Diagnosis not present

## 2021-09-16 DIAGNOSIS — N186 End stage renal disease: Secondary | ICD-10-CM | POA: Diagnosis not present

## 2021-09-16 DIAGNOSIS — Z992 Dependence on renal dialysis: Secondary | ICD-10-CM | POA: Diagnosis not present

## 2021-09-19 DIAGNOSIS — Z992 Dependence on renal dialysis: Secondary | ICD-10-CM | POA: Diagnosis not present

## 2021-09-19 DIAGNOSIS — N186 End stage renal disease: Secondary | ICD-10-CM | POA: Diagnosis not present

## 2021-09-21 DIAGNOSIS — Z992 Dependence on renal dialysis: Secondary | ICD-10-CM | POA: Diagnosis not present

## 2021-09-21 DIAGNOSIS — N186 End stage renal disease: Secondary | ICD-10-CM | POA: Diagnosis not present

## 2021-09-22 DIAGNOSIS — R69 Illness, unspecified: Secondary | ICD-10-CM | POA: Diagnosis not present

## 2021-09-22 DIAGNOSIS — F411 Generalized anxiety disorder: Secondary | ICD-10-CM | POA: Diagnosis not present

## 2021-09-22 DIAGNOSIS — F331 Major depressive disorder, recurrent, moderate: Secondary | ICD-10-CM | POA: Diagnosis not present

## 2021-09-22 DIAGNOSIS — Z720 Tobacco use: Secondary | ICD-10-CM | POA: Diagnosis not present

## 2021-09-23 DIAGNOSIS — N186 End stage renal disease: Secondary | ICD-10-CM | POA: Diagnosis not present

## 2021-09-23 DIAGNOSIS — Z992 Dependence on renal dialysis: Secondary | ICD-10-CM | POA: Diagnosis not present

## 2021-09-26 DIAGNOSIS — Z992 Dependence on renal dialysis: Secondary | ICD-10-CM | POA: Diagnosis not present

## 2021-09-26 DIAGNOSIS — N186 End stage renal disease: Secondary | ICD-10-CM | POA: Diagnosis not present

## 2021-09-28 DIAGNOSIS — N186 End stage renal disease: Secondary | ICD-10-CM | POA: Diagnosis not present

## 2021-09-28 DIAGNOSIS — Z992 Dependence on renal dialysis: Secondary | ICD-10-CM | POA: Diagnosis not present

## 2021-09-30 DIAGNOSIS — Z992 Dependence on renal dialysis: Secondary | ICD-10-CM | POA: Diagnosis not present

## 2021-09-30 DIAGNOSIS — N186 End stage renal disease: Secondary | ICD-10-CM | POA: Diagnosis not present

## 2021-10-02 DIAGNOSIS — N186 End stage renal disease: Secondary | ICD-10-CM | POA: Diagnosis not present

## 2021-10-02 DIAGNOSIS — Z992 Dependence on renal dialysis: Secondary | ICD-10-CM | POA: Diagnosis not present

## 2021-10-03 DIAGNOSIS — Z992 Dependence on renal dialysis: Secondary | ICD-10-CM | POA: Diagnosis not present

## 2021-10-03 DIAGNOSIS — N186 End stage renal disease: Secondary | ICD-10-CM | POA: Diagnosis not present

## 2021-10-05 DIAGNOSIS — Z992 Dependence on renal dialysis: Secondary | ICD-10-CM | POA: Diagnosis not present

## 2021-10-05 DIAGNOSIS — N186 End stage renal disease: Secondary | ICD-10-CM | POA: Diagnosis not present

## 2021-10-07 DIAGNOSIS — N186 End stage renal disease: Secondary | ICD-10-CM | POA: Diagnosis not present

## 2021-10-07 DIAGNOSIS — Z992 Dependence on renal dialysis: Secondary | ICD-10-CM | POA: Diagnosis not present

## 2021-10-10 DIAGNOSIS — Z992 Dependence on renal dialysis: Secondary | ICD-10-CM | POA: Diagnosis not present

## 2021-10-10 DIAGNOSIS — N186 End stage renal disease: Secondary | ICD-10-CM | POA: Diagnosis not present

## 2021-10-12 DIAGNOSIS — Z992 Dependence on renal dialysis: Secondary | ICD-10-CM | POA: Diagnosis not present

## 2021-10-12 DIAGNOSIS — N186 End stage renal disease: Secondary | ICD-10-CM | POA: Diagnosis not present

## 2021-10-13 ENCOUNTER — Encounter (HOSPITAL_COMMUNITY): Payer: Self-pay | Admitting: Radiology

## 2021-10-13 ENCOUNTER — Ambulatory Visit (HOSPITAL_COMMUNITY)
Admission: RE | Admit: 2021-10-13 | Discharge: 2021-10-13 | Disposition: A | Discharge status: Home / Self Care | Payer: Medicare HMO | Source: Ambulatory Visit | Attending: Internal Medicine | Admitting: Internal Medicine

## 2021-10-13 DIAGNOSIS — Z1231 Encounter for screening mammogram for malignant neoplasm of breast: Secondary | ICD-10-CM | POA: Diagnosis not present

## 2021-10-14 DIAGNOSIS — Z992 Dependence on renal dialysis: Secondary | ICD-10-CM | POA: Diagnosis not present

## 2021-10-14 DIAGNOSIS — N186 End stage renal disease: Secondary | ICD-10-CM | POA: Diagnosis not present

## 2021-10-17 DIAGNOSIS — N186 End stage renal disease: Secondary | ICD-10-CM | POA: Diagnosis not present

## 2021-10-17 DIAGNOSIS — Z992 Dependence on renal dialysis: Secondary | ICD-10-CM | POA: Diagnosis not present

## 2021-10-19 DIAGNOSIS — Z992 Dependence on renal dialysis: Secondary | ICD-10-CM | POA: Diagnosis not present

## 2021-10-19 DIAGNOSIS — N186 End stage renal disease: Secondary | ICD-10-CM | POA: Diagnosis not present

## 2021-10-21 DIAGNOSIS — Z992 Dependence on renal dialysis: Secondary | ICD-10-CM | POA: Diagnosis not present

## 2021-10-21 DIAGNOSIS — N186 End stage renal disease: Secondary | ICD-10-CM | POA: Diagnosis not present

## 2021-10-24 DIAGNOSIS — N186 End stage renal disease: Secondary | ICD-10-CM | POA: Diagnosis not present

## 2021-10-24 DIAGNOSIS — Z992 Dependence on renal dialysis: Secondary | ICD-10-CM | POA: Diagnosis not present

## 2021-10-26 DIAGNOSIS — Z992 Dependence on renal dialysis: Secondary | ICD-10-CM | POA: Diagnosis not present

## 2021-10-26 DIAGNOSIS — N186 End stage renal disease: Secondary | ICD-10-CM | POA: Diagnosis not present

## 2021-10-28 DIAGNOSIS — Z992 Dependence on renal dialysis: Secondary | ICD-10-CM | POA: Diagnosis not present

## 2021-10-28 DIAGNOSIS — N186 End stage renal disease: Secondary | ICD-10-CM | POA: Diagnosis not present

## 2021-10-31 DIAGNOSIS — Z992 Dependence on renal dialysis: Secondary | ICD-10-CM | POA: Diagnosis not present

## 2021-10-31 DIAGNOSIS — N186 End stage renal disease: Secondary | ICD-10-CM | POA: Diagnosis not present

## 2021-11-02 DIAGNOSIS — N186 End stage renal disease: Secondary | ICD-10-CM | POA: Diagnosis not present

## 2021-11-02 DIAGNOSIS — Z992 Dependence on renal dialysis: Secondary | ICD-10-CM | POA: Diagnosis not present

## 2021-11-04 DIAGNOSIS — Z992 Dependence on renal dialysis: Secondary | ICD-10-CM | POA: Diagnosis not present

## 2021-11-04 DIAGNOSIS — N186 End stage renal disease: Secondary | ICD-10-CM | POA: Diagnosis not present

## 2021-11-07 DIAGNOSIS — Z992 Dependence on renal dialysis: Secondary | ICD-10-CM | POA: Diagnosis not present

## 2021-11-07 DIAGNOSIS — N186 End stage renal disease: Secondary | ICD-10-CM | POA: Diagnosis not present

## 2021-11-09 DIAGNOSIS — N186 End stage renal disease: Secondary | ICD-10-CM | POA: Diagnosis not present

## 2021-11-09 DIAGNOSIS — Z992 Dependence on renal dialysis: Secondary | ICD-10-CM | POA: Diagnosis not present

## 2021-11-11 DIAGNOSIS — Z992 Dependence on renal dialysis: Secondary | ICD-10-CM | POA: Diagnosis not present

## 2021-11-11 DIAGNOSIS — N186 End stage renal disease: Secondary | ICD-10-CM | POA: Diagnosis not present

## 2021-11-14 DIAGNOSIS — N186 End stage renal disease: Secondary | ICD-10-CM | POA: Diagnosis not present

## 2021-11-14 DIAGNOSIS — Z992 Dependence on renal dialysis: Secondary | ICD-10-CM | POA: Diagnosis not present

## 2021-11-15 DIAGNOSIS — N186 End stage renal disease: Secondary | ICD-10-CM | POA: Diagnosis not present

## 2021-11-15 DIAGNOSIS — I509 Heart failure, unspecified: Secondary | ICD-10-CM | POA: Diagnosis not present

## 2021-11-15 DIAGNOSIS — R69 Illness, unspecified: Secondary | ICD-10-CM | POA: Diagnosis not present

## 2021-11-15 DIAGNOSIS — Z794 Long term (current) use of insulin: Secondary | ICD-10-CM | POA: Diagnosis not present

## 2021-11-15 DIAGNOSIS — E261 Secondary hyperaldosteronism: Secondary | ICD-10-CM | POA: Diagnosis not present

## 2021-11-15 DIAGNOSIS — E1122 Type 2 diabetes mellitus with diabetic chronic kidney disease: Secondary | ICD-10-CM | POA: Diagnosis not present

## 2021-11-15 DIAGNOSIS — E785 Hyperlipidemia, unspecified: Secondary | ICD-10-CM | POA: Diagnosis not present

## 2021-11-15 DIAGNOSIS — Z6841 Body Mass Index (BMI) 40.0 and over, adult: Secondary | ICD-10-CM | POA: Diagnosis not present

## 2021-11-15 DIAGNOSIS — Z992 Dependence on renal dialysis: Secondary | ICD-10-CM | POA: Diagnosis not present

## 2021-11-15 DIAGNOSIS — I69351 Hemiplegia and hemiparesis following cerebral infarction affecting right dominant side: Secondary | ICD-10-CM | POA: Diagnosis not present

## 2021-11-15 DIAGNOSIS — I110 Hypertensive heart disease with heart failure: Secondary | ICD-10-CM | POA: Diagnosis not present

## 2021-11-16 DIAGNOSIS — N186 End stage renal disease: Secondary | ICD-10-CM | POA: Diagnosis not present

## 2021-11-16 DIAGNOSIS — Z992 Dependence on renal dialysis: Secondary | ICD-10-CM | POA: Diagnosis not present

## 2021-11-18 DIAGNOSIS — N186 End stage renal disease: Secondary | ICD-10-CM | POA: Diagnosis not present

## 2021-11-18 DIAGNOSIS — Z992 Dependence on renal dialysis: Secondary | ICD-10-CM | POA: Diagnosis not present

## 2021-11-21 DIAGNOSIS — Z992 Dependence on renal dialysis: Secondary | ICD-10-CM | POA: Diagnosis not present

## 2021-11-21 DIAGNOSIS — N186 End stage renal disease: Secondary | ICD-10-CM | POA: Diagnosis not present

## 2021-11-23 DIAGNOSIS — Z992 Dependence on renal dialysis: Secondary | ICD-10-CM | POA: Diagnosis not present

## 2021-11-23 DIAGNOSIS — N186 End stage renal disease: Secondary | ICD-10-CM | POA: Diagnosis not present

## 2021-11-25 DIAGNOSIS — Z992 Dependence on renal dialysis: Secondary | ICD-10-CM | POA: Diagnosis not present

## 2021-11-25 DIAGNOSIS — N186 End stage renal disease: Secondary | ICD-10-CM | POA: Diagnosis not present

## 2021-11-28 DIAGNOSIS — N186 End stage renal disease: Secondary | ICD-10-CM | POA: Diagnosis not present

## 2021-11-28 DIAGNOSIS — Z992 Dependence on renal dialysis: Secondary | ICD-10-CM | POA: Diagnosis not present

## 2021-11-30 ENCOUNTER — Ambulatory Visit: Payer: Medicare HMO | Admitting: Nurse Practitioner

## 2021-11-30 DIAGNOSIS — Z992 Dependence on renal dialysis: Secondary | ICD-10-CM | POA: Diagnosis not present

## 2021-11-30 DIAGNOSIS — N186 End stage renal disease: Secondary | ICD-10-CM | POA: Diagnosis not present

## 2021-12-01 ENCOUNTER — Encounter: Payer: Self-pay | Admitting: Nurse Practitioner

## 2021-12-01 ENCOUNTER — Ambulatory Visit (INDEPENDENT_AMBULATORY_CARE_PROVIDER_SITE_OTHER): Payer: Medicare HMO | Admitting: Nurse Practitioner

## 2021-12-01 VITALS — BP 135/84 | HR 79 | Ht 64.0 in | Wt 243.0 lb

## 2021-12-01 DIAGNOSIS — Z794 Long term (current) use of insulin: Secondary | ICD-10-CM | POA: Diagnosis not present

## 2021-12-01 DIAGNOSIS — I10 Essential (primary) hypertension: Secondary | ICD-10-CM

## 2021-12-01 DIAGNOSIS — E1165 Type 2 diabetes mellitus with hyperglycemia: Secondary | ICD-10-CM

## 2021-12-01 DIAGNOSIS — E782 Mixed hyperlipidemia: Secondary | ICD-10-CM

## 2021-12-01 DIAGNOSIS — N186 End stage renal disease: Secondary | ICD-10-CM | POA: Diagnosis not present

## 2021-12-01 DIAGNOSIS — Z992 Dependence on renal dialysis: Secondary | ICD-10-CM | POA: Diagnosis not present

## 2021-12-01 LAB — POCT GLYCOSYLATED HEMOGLOBIN (HGB A1C): HbA1c POC (<> result, manual entry): 9 % (ref 4.0–5.6)

## 2021-12-01 NOTE — Patient Instructions (Signed)
Diabetes Mellitus and Foot Care Foot care is an important part of your health, especially when you have diabetes. Diabetes may cause you to have problems because of poor blood flow (circulation) to your feet and legs, which can cause your skin to: Become thinner and drier. Break more easily. Heal more slowly. Peel and crack. You may also have nerve damage (neuropathy) in your legs and feet, causing decreased feeling in them. This means that you may not notice minor injuries to your feet that could lead to more serious problems. Noticing and addressing any potential problems early is the best way to prevent future foot problems. How to care for your feet Foot hygiene  Wash your feet daily with warm water and mild soap. Do not use hot water. Then, pat your feet and the areas between your toes until they are completely dry. Do not soak your feet as this can dry your skin. Trim your toenails straight across. Do not dig under them or around the cuticle. File the edges of your nails with an emery board or nail file. Apply a moisturizing lotion or petroleum jelly to the skin on your feet and to dry, brittle toenails. Use lotion that does not contain alcohol and is unscented. Do not apply lotion between your toes. Shoes and socks Wear clean socks or stockings every day. Make sure they are not too tight. Do not wear knee-high stockings since they may decrease blood flow to your legs. Wear shoes that fit properly and have enough cushioning. Always look in your shoes before you put them on to be sure there are no objects inside. To break in new shoes, wear them for just a few hours a day. This prevents injuries on your feet. Wounds, scrapes, corns, and calluses  Check your feet daily for blisters, cuts, bruises, sores, and redness. If you cannot see the bottom of your feet, use a mirror or ask someone for help. Do not cut corns or calluses or try to remove them with medicine. If you find a minor scrape,  cut, or break in the skin on your feet, keep it and the skin around it clean and dry. You may clean these areas with mild soap and water. Do not clean the area with peroxide, alcohol, or iodine. If you have a wound, scrape, corn, or callus on your foot, look at it several times a day to make sure it is healing and not infected. Check for: Redness, swelling, or pain. Fluid or blood. Warmth. Pus or a bad smell. General tips Do not cross your legs. This may decrease blood flow to your feet. Do not use heating pads or hot water bottles on your feet. They may burn your skin. If you have lost feeling in your feet or legs, you may not know this is happening until it is too late. Protect your feet from hot and cold by wearing shoes, such as at the beach or on hot pavement. Schedule a complete foot exam at least once a year (annually) or more often if you have foot problems. Report any cuts, sores, or bruises to your health care provider immediately. Where to find more information American Diabetes Association: www.diabetes.org Association of Diabetes Care & Education Specialists: www.diabeteseducator.org Contact a health care provider if: You have a medical condition that increases your risk of infection and you have any cuts, sores, or bruises on your feet. You have an injury that is not healing. You have redness on your legs or feet. You   feel burning or tingling in your legs or feet. You have pain or cramps in your legs and feet. Your legs or feet are numb. Your feet always feel cold. You have pain around any toenails. Get help right away if: You have a wound, scrape, corn, or callus on your foot and: You have pain, swelling, or redness that gets worse. You have fluid or blood coming from the wound, scrape, corn, or callus. Your wound, scrape, corn, or callus feels warm to the touch. You have pus or a bad smell coming from the wound, scrape, corn, or callus. You have a fever. You have a red  line going up your leg. Summary Check your feet every day for blisters, cuts, bruises, sores, and redness. Apply a moisturizing lotion or petroleum jelly to the skin on your feet and to dry, brittle toenails. Wear shoes that fit properly and have enough cushioning. If you have foot problems, report any cuts, sores, or bruises to your health care provider immediately. Schedule a complete foot exam at least once a year (annually) or more often if you have foot problems. This information is not intended to replace advice given to you by your health care provider. Make sure you discuss any questions you have with your health care provider. Document Revised: 12/11/2019 Document Reviewed: 12/11/2019 Elsevier Patient Education  2023 [employer]  

## 2021-12-01 NOTE — Progress Notes (Signed)
Endocrinology Follow Up Visit      12/01/2021, 2:19 PM   Subjective:    Patient ID: Samantha Pierce, female    DOB: 09/05/1970.  Samantha Pierce is being seen in follow up after being seen in consultation for management of currently uncontrolled symptomatic diabetes requested by  Carrolyn Meiers, MD.   Past Medical History:  Diagnosis Date   Anemia    Anxiety    Asystole ([place])    a. 05/2015: pt developed bradycardia->asystole during [facility] nuclear stress test, s/p brief code. Cath with only 30% prox LCx. Her asystole during Lexiscan infusion was felt to represent an excessive pharmacologic response to the agent and likely superimposed vasovagal response.   Bipolar disorder ([place])    CKD (chronic kidney disease) stage 4, GFR 15-29 ml/min (HCC)    M_W_F dialysis   COPD (chronic obstructive pulmonary disease) (HCC)    Depression    Essential hypertension    GERD (gastroesophageal reflux disease)    History of blood transfusion 2011   History of seizure 2017   only one seizure - none since   History of stroke 09/2014   Acute lacunar infarct of the left thalamus   Leg cramps    Mild CAD    a. LHC 05/2015: 30% prox Cx, otherwise widely patent.   Morbid obesity ([place])    Neuromuscular disorder ([place])    neuropathy in feet   Neuropathy    feet   PTSD (post-traumatic stress disorder)    Stroke ([place])    x 2, right side weakness   Type 2 diabetes mellitus ([place])     Past Surgical History:  Procedure Laterality Date   AV FISTULA PLACEMENT Right 01/08/2017   Procedure: RIGHT ARM BRACHIOCEPHALIC ARTERIOVENOUS (AV) FISTULA CREATION;  Surgeon: Waynetta Sandy, MD;  Location: [place];  Service: Vascular;  Laterality: Right;   AV FISTULA PLACEMENT Left 11/20/2018   Procedure: Creation of LEFT ARM ARTERIOVENOUS (AV) FISTULA;  Surgeon: Waynetta Sandy, MD;  Location: [place];  Service: Vascular;   Laterality: Left;   AV FISTULA PLACEMENT Right 02/24/2021   Procedure: RIGHT UPPER ARM BASILIC VEIN ARTERIOVENOUS (AV) FISTULA CREATION;  Surgeon: Cherre Robins, MD;  Location: [place];  Service: Vascular;  Laterality: Right;   AV FISTULA PLACEMENT Right 05/05/2021   Procedure: INSERTION OF ARTERIOVENOUS (AV) GORE-TEX GRAFT ARM;  Surgeon: Cherre Robins, MD;  Location: [place];  Service: Vascular;  Laterality: Right;   [place] Left 02/11/2019   Procedure: BASILIC VEIN TRANSPOSITION SECOND STAGE;  Surgeon: Waynetta Sandy, MD;  Location: [place];  Service: Vascular;  Laterality: Left;   CARDIAC CATHETERIZATION N/A 06/01/2015   Procedure: Left Heart Cath and Coronary Angiography;  Surgeon: Belva Crome, MD; CFX 30%, no other dz, EF nl by echo   CESAREAN SECTION     x2   DILATION AND CURETTAGE OF UTERUS     EYE SURGERY     Right eye pars plano vitrectomy    HEMATOMA EVACUATION Left 03/03/2019   Procedure: EVACUATION HEMATOMA;  Surgeon: Elam Dutch, MD;  Location: [place];  Service: Vascular;  Laterality: Left;   INSERTION OF DIALYSIS  CATHETER Right 01/04/2021   Procedure: INSERTION OF RIGHT INTERNAL JUGULAR TUNNELED DIALYSIS CATHETER;  Surgeon: Elam Dutch, MD;  Location: [facility] OR;  Service: Vascular;  Laterality: Right;   LASIK     LIGATION OF ARTERIOVENOUS  FISTULA Left 01/04/2021   Procedure: LIGATION OF LEFT ARM ARTERIOVENOUS  FISTULA;  Surgeon: Elam Dutch, MD;  Location: [place];  Service: Vascular;  Laterality: Left;   PARS PLANA VITRECTOMY  04/20/2011   Procedure: PARS PLANA VITRECTOMY WITH 25 GAUGE;  Surgeon: Hayden Pedro, MD;  Location: [place];  Service: Ophthalmology;  Laterality: Left;  membrane peel, gas fluid exchange, endolaser, repair of complex retinal detachment   PATCH ANGIOPLASTY Left 10/05/2020   Procedure: PATCH ANGIOPLASTY USING Rueben Bash BIOLOGIC PATCH;  Surgeon: Elam Dutch, MD;  Location: [place];  Service: Vascular;  Laterality: Left;    PERIPHERAL VASCULAR BALLOON ANGIOPLASTY Left 09/02/2020   Procedure: PERIPHERAL VASCULAR BALLOON ANGIOPLASTY;  Surgeon: Marty Heck, MD;  Location: [place] [facility];  Service: Cardiovascular;  Laterality: Left;  arm fistula   REMOVAL OF A DIALYSIS CATHETER N/A 06/21/2021   Procedure: MINOR REMOVAL OF TUNNELED DIALYSIS CATHETER;  Surgeon: Rosetta Posner, MD;  [facility];  Service: Vascular;  Laterality: N/A;   REVISON OF ARTERIOVENOUS FISTULA Left 10/05/2020   Procedure: REVISION OF LEFT ARM ARTERIOVENOUS FISTULA;  Surgeon: Elam Dutch, MD;  Location: [facility];  Service: Vascular;  Laterality: Left;   TUBAL LIGATION      Social History   Socioeconomic History   Marital status: Single    Spouse name: Not on file   Number of children: Not on file   Years of education: Not on file   Highest education level: Not on file  Occupational History   Not on file  Tobacco Use   Smoking status: Every Day    Packs/day: 0.03    Years: 30.00    Total pack years: 0.90    Types: Cigarettes    Start date: 06/05/1981   Smokeless tobacco: Never   Tobacco comments:    Quit in 12/2018 but now smokes off and on per patient 05/04/21.  Vaping Use   Vaping Use: Never used  Substance and Sexual Activity   Alcohol use: Yes    Comment: occ wine   Drug use: No   Sexual activity: Yes    Birth control/protection: Surgical    Comment: Tubal Ligation  Other Topics Concern   Not on file  Social History Narrative   Not on file   Social Determinants of Health   Financial Resource Strain: Not on file  Food Insecurity: Not on file  Transportation Needs: Not on file  Physical Activity: Not on file  Stress: Not on file  Social Connections: Not on file    Family History  Problem Relation Age of Onset   Diabetes Mother    Kidney failure Mother    Diabetes Father    Amblyopia Neg Hx    Blindness Neg Hx    Cataracts Neg Hx    Glaucoma Neg Hx    Macular degeneration Neg Hx    Retinal  detachment Neg Hx    Strabismus Neg Hx    Retinitis pigmentosa Neg Hx     Outpatient Encounter Medications as of 12/01/2021  Medication Sig   albuterol (PROVENTIL HFA;VENTOLIN HFA) 108 (90 BASE) MCG/ACT inhaler Inhale 1-2 puffs into the lungs every 6 (six) hours as needed for wheezing or shortness of breath.  amLODipine (NORVASC) 10 MG tablet Take 1 tablet (10 mg total) by mouth daily.   atorvastatin (LIPITOR) 80 MG tablet Take 1 tablet (80 mg total) by mouth daily at 6 PM. (Patient taking differently: Take 80 mg by mouth at bedtime.)   cloNIDine (CATAPRES) 0.2 MG tablet Take 0.2 mg by mouth 2 (two) times daily.   Continuous Blood Gluc Receiver (DEXCOM G6 RECEIVER) DEVI USE TO CHECK GLUCOSE FOUR TIMES DAILY AS DIRECTED. (Patient not taking: Reported on 08/30/2021)   FLUoxetine (PROZAC) 10 MG tablet Take 10 mg by mouth See admin instructions. Take with 20 mg for a total of 30 mg daily   FLUoxetine (PROZAC) 20 MG capsule Take 20 mg by mouth See admin instructions. Take with 10 mg for a total of 30 mg daily   furosemide (LASIX) 80 MG tablet Take 80 mg by mouth 2 (two) times daily with breakfast and lunch.   gabapentin (NEURONTIN) 300 MG capsule TAKE (1) CAPSULE BY MOUTH DAILY.   hydrALAZINE (APRESOLINE) 50 MG tablet Take 50 mg by mouth 2 (two) times daily.   hydrOXYzine (ATARAX) 10 MG tablet Take 10 mg by mouth 3 (three) times daily as needed.   insulin aspart (NOVOLOG) 100 UNIT/ML FlexPen Inject 10-16 Units into the skin 3 (three) times daily with meals.   insulin detemir (LEVEMIR) 100 UNIT/ML FlexPen Inject 60 Units into the skin at bedtime.   Lancets (ONETOUCH DELICA PLUS [id number]) MISC Apply topically.   metoprolol succinate (TOPROL-XL) 50 MG 24 hr tablet Take 50 mg by mouth daily.   OLANZapine (ZYPREXA) 2.5 MG tablet Take 2.5 mg by mouth at bedtime.   omeprazole (PRILOSEC) 20 MG capsule Take 20 mg by mouth daily before breakfast.    ONETOUCH VERIO test strip SMARTSIG:Via Meter    Semaglutide,0.25 or 0.5MG /DOS, (OZEMPIC, 0.25 OR 0.5 MG/DOSE,) 2 MG/1.5ML SOPN Inject 0.5 mg into the skin once a week.   traZODone (DESYREL) 100 MG tablet Take 100 mg by mouth at bedtime.   [DISCONTINUED] sodium bicarbonate 650 MG tablet Take 650-1,300 mg by mouth See admin instructions. Take 1300 mg in the morning and 650 mg at bedtime   No facility-administered encounter medications on file as of 12/01/2021.    ALLERGIES: Allergies  Allergen Reactions   Contrast Media [Iodinated Contrast Media] Anaphylaxis    "code blue--I died"   Penicillins Rash and Other (See Comments)    Reaction: Childhood     VACCINATION STATUS: Immunization History  Administered Date(s) Administered   Influenza,trivalent, recombinat, inj, PF 07/05/2016   Influenza-Unspecified 08/26/2020   Moderna Sars-Covid-2 Vaccination 10/27/2019, 11/24/2019   Pneumococcal Polysaccharide-23 09/05/2014, 08/26/2020    Diabetes She presents for her follow-up diabetic visit. She has type 2 diabetes mellitus. Onset time: She was diagnosed at approximate age of 18. Her disease course has been fluctuating. There are no hypoglycemic associated symptoms. Associated symptoms include blurred vision, fatigue and foot paresthesias. Pertinent negatives for diabetes include no polydipsia, no polyphagia and no polyuria. There are no hypoglycemic complications. Symptoms are stable. Diabetic complications include a CVA, heart disease, nephropathy, peripheral neuropathy and retinopathy. (Legally blind in right eye, says she was hospitalized for DKA once; now on HD) Risk factors for coronary artery disease include diabetes mellitus, dyslipidemia, hypertension, obesity, sedentary lifestyle and tobacco exposure. Current diabetic treatment includes intensive insulin program. She is compliant with treatment some of the time. Her weight is decreasing steadily. She is following a generally healthy diet. When asked about meal planning, she reported none.  She has  not had a previous visit with a dietitian. She rarely participates in exercise. Her home blood glucose trend is fluctuating minimally. Her overall blood glucose range is 180-200 mg/dl. (She presents today with her meter, no logs, showing inconsistent glucose monitoring pattern.  She is only checking once daily even though she is on MDI.  Her POCT A1c today is 9%, improving slightly.  Analysis of her meter shows 7-day average of 202, 14-day average of 200, 30-day average of 189, 90-day average of 207.) An ACE inhibitor/angiotensin II receptor blocker is not being taken. She does not see a podiatrist.Eye exam is current.  Hyperlipidemia This is a chronic problem. The current episode started more than 1 year ago. The problem is uncontrolled. Recent lipid tests were reviewed and are high. Exacerbating diseases include chronic renal disease, diabetes and obesity. Factors aggravating her hyperlipidemia include beta blockers and fatty foods. Current antihyperlipidemic treatment includes statins. The current treatment provides moderate improvement of lipids. Compliance problems include adherence to diet and adherence to exercise.  Risk factors for coronary artery disease include diabetes mellitus, dyslipidemia, hypertension, obesity and a sedentary lifestyle.  Hypertension This is a chronic problem. The current episode started more than 1 year ago. The problem has been gradually improving since onset. The problem is controlled. Associated symptoms include blurred vision. There are no associated agents to hypertension. Risk factors for coronary artery disease include diabetes mellitus, dyslipidemia, obesity, sedentary lifestyle and smoking/tobacco exposure. Past treatments include calcium channel blockers, diuretics, central alpha agonists and beta blockers. The current treatment provides mild improvement. There are no compliance problems.  Hypertensive end-organ damage includes kidney disease, CAD/MI, CVA, heart  failure and retinopathy. Identifiable causes of hypertension include chronic renal disease.    Review of systems  Constitutional: + Minimally fluctuating body weight,  current Body mass index is 41.71 kg/m. , + fatigue, no subjective hyperthermia, no subjective hypothermia Eyes: + blurry vision (diabetic retinopathy and legally blind in Right eye), no xerophthalmia ENT: no sore throat, no nodules palpated in throat, no dysphagia/odynophagia, no hoarseness Cardiovascular: no chest pain, no shortness of breath, no palpitations, no leg swelling Respiratory: no cough, no shortness of breath Gastrointestinal: no nausea/vomiting/diarrhea Musculoskeletal: no muscle/joint aches Skin: no rashes, no hyperemia Neurological: no tremors, no dizziness, + numbness/tingling to BLE R>L Psychiatric: no depression, no anxiety   Objective:     BP 135/84   Pulse 79   Ht 5\' 4"  (1.626 m)   Wt 243 lb (110.2 kg)   LMP  (LMP Unknown)   BMI 41.71 kg/m   Wt Readings from Last 3 Encounters:  12/01/21 243 lb (110.2 kg)  08/30/21 266 lb 12.8 oz (121 kg)  06/07/21 265 lb 1.6 oz (120.2 kg)    BP Readings from Last 3 Encounters:  12/01/21 135/84  08/30/21 (!) 161/71  06/07/21 129/73     Physical Exam- Limited  Constitutional:  Body mass index is 41.71 kg/m. , not in acute distress, normal state of mind Eyes:  EOMI, no exophthalmos Neck: Supple Cardiovascular: RRR, no murmurs, rubs, or gallops, no edema Respiratory: Adequate breathing efforts, no crackles, rales, rhonchi, or wheezing Musculoskeletal: no gross deformities, strength intact in all four extremities, no gross restriction of joint movements Skin:  no rashes, no hyperemia Neurological: no tremor with outstretched hands    CMP ( most recent) CMP     Component Value Date/Time   NA 134 (L) 05/05/2021 0843   K 5.6 (H) 05/05/2021 0843   CL 102 05/05/2021 0843  CO2 24 02/25/2021 0712   GLUCOSE 216 (H) 05/05/2021 0843   BUN 31 (A)  07/22/2021 0000   CREATININE 4.90 (H) 05/05/2021 0843   CALCIUM 9.1 07/22/2021 0000   PROT 6.8 02/22/2019 1222   ALBUMIN 2.9 (L) 02/22/2019 1222   AST 16 02/22/2019 1222   ALT 13 02/22/2019 1222   ALKPHOS 60 02/22/2019 1222   BILITOT 0.6 02/22/2019 1222   GFRNONAA 10 (L) 02/25/2021 0712   GFRAA 12 (L) 03/04/2019 0616     Diabetic Labs (most recent): Lab Results  Component Value Date   HGBA1C 9.0 12/01/2021   HGBA1C 9.7 08/25/2021   HGBA1C 9.3 (A) 05/24/2021     Lipid Panel ( most recent) Lipid Panel     Component Value Date/Time   CHOL 245 (H) 05/23/2015 0545   TRIG 278 (H) 05/23/2015 0545   HDL 45 05/23/2015 0545   CHOLHDL 5.4 05/23/2015 0545   VLDL 56 (H) 05/23/2015 0545   LDLCALC 144 (H) 05/23/2015 0545      Lab Results  Component Value Date   TSH 1.524 09/04/2014           Assessment & Plan:   1) Uncontrolled diabetes mellitus with stage 4 chronic kidney disease ([place])  She presents today with her meter, no logs, showing inconsistent glucose monitoring pattern.  She is only checking once daily even though she is on MDI.  Her POCT A1c today is 9%, improving slightly.  Analysis of her meter shows 7-day average of 202, 14-day average of 200, 30-day average of 189, 90-day average of 207.  - Samantha Pierce has currently uncontrolled symptomatic type 2 DM since 51 years of age.  -Recent labs reviewed.  - I had a long discussion with her about the progressive nature of diabetes and the pathology behind its complications. -her diabetes is complicated by CVA, retinopathy, neuropathy, CKD, and DKA x 1 episode and she remains at a high risk for more acute and chronic complications which include worsening CAD, CVA, CKD, retinopathy, and neuropathy. These are all discussed in detail with her.  - Nutritional counseling repeated at each appointment due to patients tendency to fall back in to old habits.  - The patient admits there is a room for improvement in their  diet and drink choices. -  Suggestion is made for the patient to avoid simple carbohydrates from their diet including Cakes, Sweet Desserts / Pastries, Ice Cream, Soda (diet and regular), Sweet Tea, Candies, Chips, Cookies, Sweet Pastries, Store Bought Juices, Alcohol in Excess of 1-2 drinks a day, Artificial Sweeteners, Coffee Creamer, and "Sugar-free" Products. This will help patient to have stable blood glucose profile and potentially avoid unintended weight gain.   - I encouraged the patient to switch to unprocessed or minimally processed complex starch and increased protein intake (animal or plant source), fruits, and vegetables.   - Patient is advised to stick to a routine mealtimes to eat 3 meals a day and avoid unnecessary snacks (to snack only to correct hypoglycemia).  - she will be scheduled with Jearld Fenton, RDN, CDE for diabetes education.  - I have approached her with the following individualized plan to manage  her diabetes and patient agrees:   -She is advised to continue her Levemir 60 units SQ nightly and continue same dose of Novolog 10-16 units TID with meals if lgucose is above 90 and she is eating (Specific instructions on how to titrate insulin dosage based on glucose readings given to patient in writing).  She is advised to increase her Ozempic to 0.5 mg SQ weekly (was afraid to increase without seeing me first).  -she is encouraged to continue monitoring blood glucose 4 times daily, before meals and before bed, and to call the clinic if she has readings less than 70 or greater than 300 for 3 tests in a row.  I sent in for Dexcom G7 to [facility].  She previously had the G6 and had trouble getting the transmitter to the sensors.  - she is warned not to take insulin without proper monitoring per orders. - Adjustment parameters are given to her for hypo and hyperglycemia in writing.  - she is not a candidate for Metformin, SGLT2 due to concurrent renal insufficiency.  -  Specific targets for  A1c;  LDL, HDL,  and Triglycerides were discussed with the patient.  2) Blood Pressure /Hypertension:  her blood pressure is controlled to target.   she is advised to continue her current medications including Norvasc 10 mg po daily, Clonidine 0.2 mg po daily, Lasix 80 mg po twice daily, Hydralazine 50 mg po twice daily and Metoprolol 50 mg  mg p.o. daily with breakfast.  Will defer any med changes to nephrology.  3) Lipids/Hyperlipidemia:    There is no recent lipid panel to review.  She is advised to continue Lipitor 80 mg po daily before bed.  Side effects and precautions discussed with her.    4)  Weight/Diet:  her Body mass index is 41.71 kg/m.  -  clearly complicating her diabetes care.   she is a candidate for weight loss. I discussed with her the fact that loss of 5 - 10% of her  current body weight will have the most impact on her diabetes management.  Exercise, and detailed carbohydrates information provided  -  detailed on discharge instructions.  5) Chronic Care/Health Maintenance: -she is not on ACEI/ARB and is on Statin medications and is encouraged to initiate and continue to follow up with Ophthalmology, Dentist, Podiatrist at least yearly or according to recommendations, and advised to stay away from smoking. I have recommended yearly flu vaccine and pneumonia vaccine at least every 5 years; moderate intensity exercise for up to 150 minutes weekly; and sleep for at least 7 hours a day.  - she is advised to maintain close follow up with Carrolyn Meiers, MD for primary care needs, as well as her other providers for optimal and coordinated care.      I spent 30 minutes in the care of the patient today including review of labs from [facility], Lipids, Thyroid Function, Hematology (current and previous including abstractions from other facilities); face-to-face time discussing  her blood glucose readings/logs, discussing hypoglycemia and hyperglycemia episodes  and symptoms, medications doses, her options of short and long term treatment based on the latest standards of care / guidelines;  discussion about incorporating lifestyle medicine;  and documenting the encounter. Risk reduction counseling performed per USPSTF guidelines to reduce obesity and cardiovascular risk factors.     Please refer to Patient Instructions for Blood Glucose Monitoring and Insulin/Medications Dosing Guide"  in media tab for additional information. Please  also refer to " Patient Self Inventory" in the Media  tab for reviewed elements of pertinent patient history.  Samantha Pierce participated in the discussions, expressed understanding, and voiced agreement with the above plans.  All questions were answered to her satisfaction. she is encouraged to contact clinic should she have any questions or concerns prior to her  return visit.   Follow up plan: - Return in about 3 months (around 03/03/2022) for Diabetes F/U with A1c in office, No previsit labs, Bring meter and logs.  [name], [facility] [address] [place], [place] [address] Phone: [phone] Fax: [phone]  12/01/2021, 2:19 PM

## 2021-12-02 DIAGNOSIS — Z992 Dependence on renal dialysis: Secondary | ICD-10-CM | POA: Diagnosis not present

## 2021-12-02 DIAGNOSIS — N186 End stage renal disease: Secondary | ICD-10-CM | POA: Diagnosis not present

## 2021-12-05 DIAGNOSIS — Z992 Dependence on renal dialysis: Secondary | ICD-10-CM | POA: Diagnosis not present

## 2021-12-05 DIAGNOSIS — N186 End stage renal disease: Secondary | ICD-10-CM | POA: Diagnosis not present

## 2021-12-07 DIAGNOSIS — N186 End stage renal disease: Secondary | ICD-10-CM | POA: Diagnosis not present

## 2021-12-07 DIAGNOSIS — E1165 Type 2 diabetes mellitus with hyperglycemia: Secondary | ICD-10-CM | POA: Diagnosis not present

## 2021-12-07 DIAGNOSIS — Z992 Dependence on renal dialysis: Secondary | ICD-10-CM | POA: Diagnosis not present

## 2021-12-09 DIAGNOSIS — N186 End stage renal disease: Secondary | ICD-10-CM | POA: Diagnosis not present

## 2021-12-09 DIAGNOSIS — Z992 Dependence on renal dialysis: Secondary | ICD-10-CM | POA: Diagnosis not present

## 2021-12-12 DIAGNOSIS — N186 End stage renal disease: Secondary | ICD-10-CM | POA: Diagnosis not present

## 2021-12-12 DIAGNOSIS — E119 Type 2 diabetes mellitus without complications: Secondary | ICD-10-CM | POA: Diagnosis not present

## 2021-12-12 DIAGNOSIS — Z992 Dependence on renal dialysis: Secondary | ICD-10-CM | POA: Diagnosis not present

## 2021-12-14 DIAGNOSIS — N186 End stage renal disease: Secondary | ICD-10-CM | POA: Diagnosis not present

## 2021-12-14 DIAGNOSIS — Z992 Dependence on renal dialysis: Secondary | ICD-10-CM | POA: Diagnosis not present

## 2021-12-16 DIAGNOSIS — N186 End stage renal disease: Secondary | ICD-10-CM | POA: Diagnosis not present

## 2021-12-16 DIAGNOSIS — Z992 Dependence on renal dialysis: Secondary | ICD-10-CM | POA: Diagnosis not present

## 2021-12-19 DIAGNOSIS — N186 End stage renal disease: Secondary | ICD-10-CM | POA: Diagnosis not present

## 2021-12-19 DIAGNOSIS — Z992 Dependence on renal dialysis: Secondary | ICD-10-CM | POA: Diagnosis not present

## 2021-12-20 DIAGNOSIS — F411 Generalized anxiety disorder: Secondary | ICD-10-CM | POA: Diagnosis not present

## 2021-12-20 DIAGNOSIS — Z720 Tobacco use: Secondary | ICD-10-CM | POA: Diagnosis not present

## 2021-12-20 DIAGNOSIS — R69 Illness, unspecified: Secondary | ICD-10-CM | POA: Diagnosis not present

## 2021-12-21 DIAGNOSIS — Z992 Dependence on renal dialysis: Secondary | ICD-10-CM | POA: Diagnosis not present

## 2021-12-21 DIAGNOSIS — N186 End stage renal disease: Secondary | ICD-10-CM | POA: Diagnosis not present

## 2021-12-23 DIAGNOSIS — Z992 Dependence on renal dialysis: Secondary | ICD-10-CM | POA: Diagnosis not present

## 2021-12-23 DIAGNOSIS — N186 End stage renal disease: Secondary | ICD-10-CM | POA: Diagnosis not present

## 2021-12-26 DIAGNOSIS — N186 End stage renal disease: Secondary | ICD-10-CM | POA: Diagnosis not present

## 2021-12-26 DIAGNOSIS — Z992 Dependence on renal dialysis: Secondary | ICD-10-CM | POA: Diagnosis not present

## 2021-12-28 DIAGNOSIS — N186 End stage renal disease: Secondary | ICD-10-CM | POA: Diagnosis not present

## 2021-12-28 DIAGNOSIS — Z992 Dependence on renal dialysis: Secondary | ICD-10-CM | POA: Diagnosis not present

## 2021-12-30 DIAGNOSIS — N186 End stage renal disease: Secondary | ICD-10-CM | POA: Diagnosis not present

## 2021-12-30 DIAGNOSIS — Z992 Dependence on renal dialysis: Secondary | ICD-10-CM | POA: Diagnosis not present

## 2022-01-02 DIAGNOSIS — Z992 Dependence on renal dialysis: Secondary | ICD-10-CM | POA: Diagnosis not present

## 2022-01-02 DIAGNOSIS — N186 End stage renal disease: Secondary | ICD-10-CM | POA: Diagnosis not present

## 2022-01-04 ENCOUNTER — Other Ambulatory Visit: Payer: Self-pay | Admitting: Nurse Practitioner

## 2022-01-04 DIAGNOSIS — Z992 Dependence on renal dialysis: Secondary | ICD-10-CM | POA: Diagnosis not present

## 2022-01-04 DIAGNOSIS — N186 End stage renal disease: Secondary | ICD-10-CM | POA: Diagnosis not present

## 2022-01-06 DIAGNOSIS — N186 End stage renal disease: Secondary | ICD-10-CM | POA: Diagnosis not present

## 2022-01-06 DIAGNOSIS — Z992 Dependence on renal dialysis: Secondary | ICD-10-CM | POA: Diagnosis not present

## 2022-01-07 DIAGNOSIS — E1165 Type 2 diabetes mellitus with hyperglycemia: Secondary | ICD-10-CM | POA: Diagnosis not present

## 2022-01-09 DIAGNOSIS — N186 End stage renal disease: Secondary | ICD-10-CM | POA: Diagnosis not present

## 2022-01-09 DIAGNOSIS — Z992 Dependence on renal dialysis: Secondary | ICD-10-CM | POA: Diagnosis not present

## 2022-01-10 DIAGNOSIS — E559 Vitamin D deficiency, unspecified: Secondary | ICD-10-CM | POA: Diagnosis not present

## 2022-01-10 DIAGNOSIS — E785 Hyperlipidemia, unspecified: Secondary | ICD-10-CM | POA: Diagnosis not present

## 2022-01-10 DIAGNOSIS — F319 Bipolar disorder, unspecified: Secondary | ICD-10-CM | POA: Diagnosis not present

## 2022-01-10 DIAGNOSIS — I5032 Chronic diastolic (congestive) heart failure: Secondary | ICD-10-CM | POA: Diagnosis not present

## 2022-01-10 DIAGNOSIS — R69 Illness, unspecified: Secondary | ICD-10-CM | POA: Diagnosis not present

## 2022-01-10 DIAGNOSIS — F17210 Nicotine dependence, cigarettes, uncomplicated: Secondary | ICD-10-CM | POA: Diagnosis not present

## 2022-01-10 DIAGNOSIS — E1122 Type 2 diabetes mellitus with diabetic chronic kidney disease: Secondary | ICD-10-CM | POA: Diagnosis not present

## 2022-01-10 DIAGNOSIS — D8481 Immunodeficiency due to conditions classified elsewhere: Secondary | ICD-10-CM | POA: Diagnosis not present

## 2022-01-10 DIAGNOSIS — N186 End stage renal disease: Secondary | ICD-10-CM | POA: Diagnosis not present

## 2022-01-10 DIAGNOSIS — I132 Hypertensive heart and chronic kidney disease with heart failure and with stage 5 chronic kidney disease, or end stage renal disease: Secondary | ICD-10-CM | POA: Diagnosis not present

## 2022-01-11 DIAGNOSIS — N186 End stage renal disease: Secondary | ICD-10-CM | POA: Diagnosis not present

## 2022-01-11 DIAGNOSIS — Z992 Dependence on renal dialysis: Secondary | ICD-10-CM | POA: Diagnosis not present

## 2022-01-13 DIAGNOSIS — Z992 Dependence on renal dialysis: Secondary | ICD-10-CM | POA: Diagnosis not present

## 2022-01-13 DIAGNOSIS — N186 End stage renal disease: Secondary | ICD-10-CM | POA: Diagnosis not present

## 2022-01-16 DIAGNOSIS — N186 End stage renal disease: Secondary | ICD-10-CM | POA: Diagnosis not present

## 2022-01-16 DIAGNOSIS — Z992 Dependence on renal dialysis: Secondary | ICD-10-CM | POA: Diagnosis not present

## 2022-01-18 DIAGNOSIS — N186 End stage renal disease: Secondary | ICD-10-CM | POA: Diagnosis not present

## 2022-01-18 DIAGNOSIS — Z992 Dependence on renal dialysis: Secondary | ICD-10-CM | POA: Diagnosis not present

## 2022-01-20 DIAGNOSIS — N186 End stage renal disease: Secondary | ICD-10-CM | POA: Diagnosis not present

## 2022-01-20 DIAGNOSIS — Z992 Dependence on renal dialysis: Secondary | ICD-10-CM | POA: Diagnosis not present

## 2022-01-23 DIAGNOSIS — R768 Other specified abnormal immunological findings in serum: Secondary | ICD-10-CM | POA: Diagnosis not present

## 2022-01-23 DIAGNOSIS — R634 Abnormal weight loss: Secondary | ICD-10-CM | POA: Diagnosis not present

## 2022-01-23 DIAGNOSIS — E7800 Pure hypercholesterolemia, unspecified: Secondary | ICD-10-CM | POA: Diagnosis not present

## 2022-01-23 DIAGNOSIS — Z992 Dependence on renal dialysis: Secondary | ICD-10-CM | POA: Diagnosis not present

## 2022-01-23 DIAGNOSIS — N186 End stage renal disease: Secondary | ICD-10-CM | POA: Diagnosis not present

## 2022-01-25 DIAGNOSIS — Z992 Dependence on renal dialysis: Secondary | ICD-10-CM | POA: Diagnosis not present

## 2022-01-25 DIAGNOSIS — N186 End stage renal disease: Secondary | ICD-10-CM | POA: Diagnosis not present

## 2022-01-26 ENCOUNTER — Ambulatory Visit (INDEPENDENT_AMBULATORY_CARE_PROVIDER_SITE_OTHER): Payer: Medicare HMO

## 2022-01-26 DIAGNOSIS — Z794 Long term (current) use of insulin: Secondary | ICD-10-CM | POA: Diagnosis not present

## 2022-01-26 DIAGNOSIS — E1165 Type 2 diabetes mellitus with hyperglycemia: Secondary | ICD-10-CM | POA: Diagnosis not present

## 2022-01-26 NOTE — Patient Instructions (Signed)
Pt notified to call with any questions/concerns.

## 2022-01-26 NOTE — Progress Notes (Signed)
Pt brings in her new Dexcom G7. Went over instructions on how to use the receiver and sensor placement. Placed G7 sensor on pts L arm. Pt voices understanding on usage and sensor placement.

## 2022-01-27 DIAGNOSIS — N186 End stage renal disease: Secondary | ICD-10-CM | POA: Diagnosis not present

## 2022-01-27 DIAGNOSIS — Z992 Dependence on renal dialysis: Secondary | ICD-10-CM | POA: Diagnosis not present

## 2022-01-30 DIAGNOSIS — Z992 Dependence on renal dialysis: Secondary | ICD-10-CM | POA: Diagnosis not present

## 2022-01-30 DIAGNOSIS — N186 End stage renal disease: Secondary | ICD-10-CM | POA: Diagnosis not present

## 2022-02-01 DIAGNOSIS — Z992 Dependence on renal dialysis: Secondary | ICD-10-CM | POA: Diagnosis not present

## 2022-02-01 DIAGNOSIS — N186 End stage renal disease: Secondary | ICD-10-CM | POA: Diagnosis not present

## 2022-02-02 DIAGNOSIS — N186 End stage renal disease: Secondary | ICD-10-CM | POA: Diagnosis not present

## 2022-02-02 DIAGNOSIS — Z992 Dependence on renal dialysis: Secondary | ICD-10-CM | POA: Diagnosis not present

## 2022-02-06 DIAGNOSIS — N186 End stage renal disease: Secondary | ICD-10-CM | POA: Diagnosis not present

## 2022-02-06 DIAGNOSIS — Z992 Dependence on renal dialysis: Secondary | ICD-10-CM | POA: Diagnosis not present

## 2022-02-07 DIAGNOSIS — E1165 Type 2 diabetes mellitus with hyperglycemia: Secondary | ICD-10-CM | POA: Diagnosis not present

## 2022-02-08 DIAGNOSIS — Z992 Dependence on renal dialysis: Secondary | ICD-10-CM | POA: Diagnosis not present

## 2022-02-08 DIAGNOSIS — N186 End stage renal disease: Secondary | ICD-10-CM | POA: Diagnosis not present

## 2022-02-09 ENCOUNTER — Telehealth: Payer: Self-pay | Admitting: *Deleted

## 2022-02-09 DIAGNOSIS — I132 Hypertensive heart and chronic kidney disease with heart failure and with stage 5 chronic kidney disease, or end stage renal disease: Secondary | ICD-10-CM | POA: Diagnosis not present

## 2022-02-09 DIAGNOSIS — R69 Illness, unspecified: Secondary | ICD-10-CM | POA: Diagnosis not present

## 2022-02-09 DIAGNOSIS — N186 End stage renal disease: Secondary | ICD-10-CM | POA: Diagnosis not present

## 2022-02-09 DIAGNOSIS — Z23 Encounter for immunization: Secondary | ICD-10-CM | POA: Diagnosis not present

## 2022-02-09 DIAGNOSIS — Z0001 Encounter for general adult medical examination with abnormal findings: Secondary | ICD-10-CM | POA: Diagnosis not present

## 2022-02-09 DIAGNOSIS — E559 Vitamin D deficiency, unspecified: Secondary | ICD-10-CM | POA: Diagnosis not present

## 2022-02-09 DIAGNOSIS — Z79899 Other long term (current) drug therapy: Secondary | ICD-10-CM | POA: Diagnosis not present

## 2022-02-09 DIAGNOSIS — I5032 Chronic diastolic (congestive) heart failure: Secondary | ICD-10-CM | POA: Diagnosis not present

## 2022-02-09 DIAGNOSIS — Z1159 Encounter for screening for other viral diseases: Secondary | ICD-10-CM | POA: Diagnosis not present

## 2022-02-09 DIAGNOSIS — E1122 Type 2 diabetes mellitus with diabetic chronic kidney disease: Secondary | ICD-10-CM | POA: Diagnosis not present

## 2022-02-09 DIAGNOSIS — Z7189 Other specified counseling: Secondary | ICD-10-CM | POA: Diagnosis not present

## 2022-02-09 NOTE — Patient Outreach (Signed)
  Care Coordination   02/09/2022 Name: Samantha Pierce MRN: [id number] DOB: 05-25-1971   Care Coordination Outreach Attempts:  An unsuccessful telephone outreach was attempted today to offer the patient information about available care coordination services as a benefit of their health plan.   Follow Up Plan:  Additional outreach attempts will be made to offer the patient care coordination information and services.   Encounter Outcome:  No Answer  Care Coordination Interventions Activated:  No   Care Coordination Interventions:  No, not indicated    Valente David, RN, MSN, [facility] Care Management Care Management Coordinator [phone]

## 2022-02-10 DIAGNOSIS — Z992 Dependence on renal dialysis: Secondary | ICD-10-CM | POA: Diagnosis not present

## 2022-02-10 DIAGNOSIS — N186 End stage renal disease: Secondary | ICD-10-CM | POA: Diagnosis not present

## 2022-02-13 DIAGNOSIS — Z992 Dependence on renal dialysis: Secondary | ICD-10-CM | POA: Diagnosis not present

## 2022-02-13 DIAGNOSIS — N186 End stage renal disease: Secondary | ICD-10-CM | POA: Diagnosis not present

## 2022-02-15 ENCOUNTER — Telehealth: Payer: Self-pay | Admitting: *Deleted

## 2022-02-15 DIAGNOSIS — N186 End stage renal disease: Secondary | ICD-10-CM | POA: Diagnosis not present

## 2022-02-15 DIAGNOSIS — Z992 Dependence on renal dialysis: Secondary | ICD-10-CM | POA: Diagnosis not present

## 2022-02-15 NOTE — Patient Outreach (Signed)
  Care Coordination   02/15/2022 Name: KENDEL PESNELL MRN: [id number] DOB: 1971-01-08   Care Coordination Outreach Attempts:  A second unsuccessful outreach was attempted today to offer the patient with information about available care coordination services as a benefit of their health plan.     Follow Up Plan:  Additional outreach attempts will be made to offer the patient care coordination information and services.   Encounter Outcome:  No Answer  Care Coordination Interventions Activated:  No   Care Coordination Interventions:  No, not indicated    Valente David, RN, MSN, [facility] Care Management Care Management Coordinator [phone]

## 2022-02-17 DIAGNOSIS — Z992 Dependence on renal dialysis: Secondary | ICD-10-CM | POA: Diagnosis not present

## 2022-02-17 DIAGNOSIS — N186 End stage renal disease: Secondary | ICD-10-CM | POA: Diagnosis not present

## 2022-02-20 ENCOUNTER — Telehealth: Payer: Self-pay | Admitting: *Deleted

## 2022-02-20 DIAGNOSIS — Z992 Dependence on renal dialysis: Secondary | ICD-10-CM | POA: Diagnosis not present

## 2022-02-20 DIAGNOSIS — N186 End stage renal disease: Secondary | ICD-10-CM | POA: Diagnosis not present

## 2022-02-20 NOTE — Patient Outreach (Signed)
  Care Coordination   02/20/2022 Name: Samantha Pierce MRN: [id number] DOB: 1970/09/17   Care Coordination Outreach Attempts:  A third unsuccessful outreach was attempted today to offer the patient with information about available care coordination services as a benefit of their health plan.   Follow Up Plan:  No further outreach attempts will be made at this time. We have been unable to contact the patient to offer or enroll patient in care coordination services  Encounter Outcome:  No Answer  Care Coordination Interventions Activated:  No   Care Coordination Interventions:  No, not indicated    Valente David, RN, MSN, [facility] Care Management Care Management Coordinator [phone]

## 2022-02-22 DIAGNOSIS — Z992 Dependence on renal dialysis: Secondary | ICD-10-CM | POA: Diagnosis not present

## 2022-02-22 DIAGNOSIS — N186 End stage renal disease: Secondary | ICD-10-CM | POA: Diagnosis not present

## 2022-02-24 DIAGNOSIS — N186 End stage renal disease: Secondary | ICD-10-CM | POA: Diagnosis not present

## 2022-02-24 DIAGNOSIS — Z992 Dependence on renal dialysis: Secondary | ICD-10-CM | POA: Diagnosis not present

## 2022-02-27 DIAGNOSIS — N186 End stage renal disease: Secondary | ICD-10-CM | POA: Diagnosis not present

## 2022-02-27 DIAGNOSIS — Z992 Dependence on renal dialysis: Secondary | ICD-10-CM | POA: Diagnosis not present

## 2022-03-01 DIAGNOSIS — Z992 Dependence on renal dialysis: Secondary | ICD-10-CM | POA: Diagnosis not present

## 2022-03-01 DIAGNOSIS — N186 End stage renal disease: Secondary | ICD-10-CM | POA: Diagnosis not present

## 2022-03-03 DIAGNOSIS — Z992 Dependence on renal dialysis: Secondary | ICD-10-CM | POA: Diagnosis not present

## 2022-03-03 DIAGNOSIS — N186 End stage renal disease: Secondary | ICD-10-CM | POA: Diagnosis not present

## 2022-03-04 DIAGNOSIS — N186 End stage renal disease: Secondary | ICD-10-CM | POA: Diagnosis not present

## 2022-03-04 DIAGNOSIS — Z992 Dependence on renal dialysis: Secondary | ICD-10-CM | POA: Diagnosis not present

## 2022-03-07 ENCOUNTER — Encounter: Payer: Self-pay | Admitting: Nurse Practitioner

## 2022-03-07 ENCOUNTER — Ambulatory Visit (INDEPENDENT_AMBULATORY_CARE_PROVIDER_SITE_OTHER): Payer: Medicare HMO | Admitting: Nurse Practitioner

## 2022-03-07 VITALS — BP 138/76 | HR 80 | Ht 64.0 in | Wt 239.4 lb

## 2022-03-07 DIAGNOSIS — E782 Mixed hyperlipidemia: Secondary | ICD-10-CM | POA: Diagnosis not present

## 2022-03-07 DIAGNOSIS — I10 Essential (primary) hypertension: Secondary | ICD-10-CM | POA: Diagnosis not present

## 2022-03-07 DIAGNOSIS — Z794 Long term (current) use of insulin: Secondary | ICD-10-CM

## 2022-03-07 DIAGNOSIS — E1165 Type 2 diabetes mellitus with hyperglycemia: Secondary | ICD-10-CM | POA: Diagnosis not present

## 2022-03-07 LAB — POCT GLYCOSYLATED HEMOGLOBIN (HGB A1C): Hemoglobin A1C: 7.9 % — AB (ref 4.0–5.6)

## 2022-03-07 MED ORDER — INSULIN DETEMIR 100 UNIT/ML FLEXPEN: 50.0000 [IU] | PEN_INJECTOR | Freq: Every day | SUBCUTANEOUS | Status: DC

## 2022-03-07 MED ORDER — SEMAGLUTIDE (1 MG/DOSE) 4 MG/3ML ~~LOC~~ SOPN: 1.0000 mg | PEN_INJECTOR | SUBCUTANEOUS | 3 refills | Status: DC

## 2022-03-07 NOTE — Progress Notes (Signed)
Endocrinology Follow Up Visit      03/07/2022, 1:57 PM   Subjective:    Patient ID: Samantha Pierce, female    DOB: 1971-01-08.  Samantha Pierce is being seen in follow up after being seen in consultation for management of currently uncontrolled symptomatic diabetes requested by  Carrolyn Meiers, MD.   Past Medical History:  Diagnosis Date   Anemia    Anxiety    Asystole ([place])    a. 05/2015: pt developed bradycardia->asystole during [place] nuclear stress test, s/p brief code. Cath with only 30% prox LCx. Her asystole during Lexiscan infusion was felt to represent an excessive pharmacologic response to the agent and likely superimposed vasovagal response.   Bipolar disorder ([place])    CKD (chronic kidney disease) stage 4, GFR 15-29 ml/min (HCC)    M_W_F dialysis   COPD (chronic obstructive pulmonary disease) (HCC)    Depression    Essential hypertension    GERD (gastroesophageal reflux disease)    History of blood transfusion 2011   History of seizure 2017   only one seizure - none since   History of stroke 09/2014   Acute lacunar infarct of the left thalamus   Leg cramps    Mild CAD    a. LHC 05/2015: 30% prox Cx, otherwise widely patent.   Morbid obesity ([place])    Neuromuscular disorder ([place])    neuropathy in feet   Neuropathy    feet   PTSD (post-traumatic stress disorder)    Stroke ([place])    x 2, right side weakness   Type 2 diabetes mellitus ([place])     Past Surgical History:  Procedure Laterality Date   AV FISTULA PLACEMENT Right 01/08/2017   Procedure: RIGHT ARM BRACHIOCEPHALIC ARTERIOVENOUS (AV) FISTULA CREATION;  Surgeon: Waynetta Sandy, MD;  Location: [facility];  Service: Vascular;  Laterality: Right;   AV FISTULA PLACEMENT Left 11/20/2018   Procedure: Creation of LEFT ARM ARTERIOVENOUS (AV) FISTULA;  Surgeon: Waynetta Sandy, MD;  Location: [facility];  Service: Vascular;   Laterality: Left;   AV FISTULA PLACEMENT Right 02/24/2021   Procedure: RIGHT UPPER ARM BASILIC VEIN ARTERIOVENOUS (AV) FISTULA CREATION;  Surgeon: Cherre Robins, MD;  Location: [facility];  Service: Vascular;  Laterality: Right;   AV FISTULA PLACEMENT Right 05/05/2021   Procedure: INSERTION OF ARTERIOVENOUS (AV) GORE-TEX GRAFT ARM;  Surgeon: Cherre Robins, MD;  Location: [facility];  Service: Vascular;  Laterality: Right;   [place] Left 02/11/2019   Procedure: BASILIC VEIN TRANSPOSITION SECOND STAGE;  Surgeon: Waynetta Sandy, MD;  Location: [facility];  Service: Vascular;  Laterality: Left;   CARDIAC CATHETERIZATION N/A 06/01/2015   Procedure: Left Heart Cath and Coronary Angiography;  Surgeon: Belva Crome, MD; CFX 30%, no other dz, EF nl by echo   CESAREAN SECTION     x2   DILATION AND CURETTAGE OF UTERUS     EYE SURGERY     Right eye pars plano vitrectomy    HEMATOMA EVACUATION Left 03/03/2019   Procedure: EVACUATION HEMATOMA;  Surgeon: Elam Dutch, MD;  Location: [facility];  Service: Vascular;  Laterality: Left;   INSERTION OF DIALYSIS  CATHETER Right 01/04/2021   Procedure: INSERTION OF RIGHT INTERNAL JUGULAR TUNNELED DIALYSIS CATHETER;  Surgeon: Elam Dutch, MD;  Location[facility];  Service: Vascular;  Laterality: Right;   LASIK     LIGATION OF ARTERIOVENOUS  FISTULA Left 01/04/2021   Procedure: LIGATION OF LEFT ARM ARTERIOVENOUS  FISTULA;  Surgeon: Elam Dutch, MD;  Location: [facility];  Service: Vascular;  Laterality: Left;   PARS PLANA VITRECTOMY  04/20/2011   Procedure: PARS PLANA VITRECTOMY WITH 25 GAUGE;  Surgeon: Hayden Pedro, MD;  Location: [facility];  Service: Ophthalmology;  Laterality: Left;  membrane peel, gas fluid exchange, endolaser, repair of complex retinal detachment   PATCH ANGIOPLASTY Left 10/05/2020   Procedure: PATCH ANGIOPLASTY USING Rueben Bash BIOLOGIC PATCH;  Surgeon: Elam Dutch, MD;  Location: [facility];  Service: Vascular;  Laterality: Left;    PERIPHERAL VASCULAR BALLOON ANGIOPLASTY Left 09/02/2020   Procedure: PERIPHERAL VASCULAR BALLOON ANGIOPLASTY;  Surgeon: Marty Heck, MD;  Location: [facility];  Service: Cardiovascular;  Laterality: Left;  arm fistula   REMOVAL OF A DIALYSIS CATHETER N/A 06/21/2021   Procedure: MINOR REMOVAL OF TUNNELED DIALYSIS CATHETER;  Surgeon: Rosetta Posner, MD;  [facility];  Service: Vascular;  Laterality: N/A;   REVISON OF ARTERIOVENOUS FISTULA Left 10/05/2020   Procedure: REVISION OF LEFT ARM ARTERIOVENOUS FISTULA;  Surgeon: Elam Dutch, MD;  Location: [facility];  Service: Vascular;  Laterality: Left;   TUBAL LIGATION      Social History   Socioeconomic History   Marital status: Single    Spouse name: Not on file   Number of children: Not on file   Years of education: Not on file   Highest education level: Not on file  Occupational History   Not on file  Tobacco Use   Smoking status: Every Day    Packs/day: 0.03    Years: 30.00    Total pack years: 0.90    Types: Cigarettes    Start date: 06/05/1981   Smokeless tobacco: Never   Tobacco comments:    Quit in 12/2018 but now smokes off and on per patient 05/04/21.  Vaping Use   Vaping Use: Never used  Substance and Sexual Activity   Alcohol use: Yes    Comment: occ wine   Drug use: No   Sexual activity: Yes    Birth control/protection: Surgical    Comment: Tubal Ligation  Other Topics Concern   Not on file  Social History Narrative   Not on file   Social Determinants of Health   Financial Resource Strain: Not on file  Food Insecurity: Not on file  Transportation Needs: Not on file  Physical Activity: Not on file  Stress: Not on file  Social Connections: Not on file    Family History  Problem Relation Age of Onset   Diabetes Mother    Kidney failure Mother    Diabetes Father    Amblyopia Neg Hx    Blindness Neg Hx    Cataracts Neg Hx    Glaucoma Neg Hx    Macular degeneration Neg Hx    Retinal  detachment Neg Hx    Strabismus Neg Hx    Retinitis pigmentosa Neg Hx     Outpatient Encounter Medications as of 03/07/2022  Medication Sig   albuterol (PROVENTIL HFA;VENTOLIN HFA) 108 (90 BASE) MCG/ACT inhaler Inhale 1-2 puffs into the lungs every 6 (six) hours as needed for wheezing or shortness of breath.  amLODipine (NORVASC) 10 MG tablet Take 1 tablet (10 mg total) by mouth daily.   atorvastatin (LIPITOR) 80 MG tablet Take 1 tablet (80 mg total) by mouth daily at 6 PM. (Patient taking differently: Take 80 mg by mouth at bedtime.)   cloNIDine (CATAPRES) 0.2 MG tablet Take 0.2 mg by mouth 2 (two) times daily.   Continuous Blood Gluc Receiver (DEXCOM G6 RECEIVER) DEVI USE TO CHECK GLUCOSE FOUR TIMES DAILY AS DIRECTED.   FLUoxetine (PROZAC) 10 MG tablet Take 10 mg by mouth See admin instructions. Take with 20 mg for a total of 30 mg daily   FLUoxetine (PROZAC) 20 MG capsule Take 20 mg by mouth See admin instructions. Take with 10 mg for a total of 30 mg daily   furosemide (LASIX) 80 MG tablet Take 80 mg by mouth 2 (two) times daily with breakfast and lunch.   gabapentin (NEURONTIN) 300 MG capsule TAKE (1) CAPSULE BY MOUTH DAILY.   hydrALAZINE (APRESOLINE) 50 MG tablet Take 50 mg by mouth 2 (two) times daily.   hydrOXYzine (ATARAX) 10 MG tablet Take 10 mg by mouth 3 (three) times daily as needed.   Lancets (ONETOUCH DELICA PLUS [id number]) MISC Apply topically.   metoprolol succinate (TOPROL-XL) 50 MG 24 hr tablet Take 50 mg by mouth daily.   NOVOLOG FLEXPEN 100 UNIT/ML FlexPen INJECT 10-16 UNITS INTO THE SKIN 3 TIMES DAILY WUTH MEALS.   OLANZapine (ZYPREXA) 2.5 MG tablet Take 2.5 mg by mouth at bedtime.   omeprazole (PRILOSEC) 20 MG capsule Take 20 mg by mouth daily before breakfast.    ONETOUCH VERIO test strip SMARTSIG:Via Meter   Semaglutide, 1 MG/DOSE, 4 MG/3ML SOPN Inject 1 mg as directed once a week.   traZODone (DESYREL) 100 MG tablet Take 100 mg by mouth at bedtime.   [DISCONTINUED]  insulin detemir (LEVEMIR) 100 UNIT/ML FlexPen Inject 60 Units into the skin at bedtime.   [DISCONTINUED] OZEMPIC, 0.25 OR 0.5 MG/DOSE, 2 MG/3ML SOPN INJECT 0.5 MG INTO THE SKIN ONCE WEEKLY.   insulin detemir (LEVEMIR) 100 UNIT/ML FlexPen Inject 50 Units into the skin at bedtime.   No facility-administered encounter medications on file as of 03/07/2022.    ALLERGIES: Allergies  Allergen Reactions   Contrast Media [Iodinated Contrast Media] Anaphylaxis    "code blue--I died"   Penicillins Rash and Other (See Comments)    Reaction: Childhood     VACCINATION STATUS: Immunization History  Administered Date(s) Administered   Influenza,trivalent, recombinat, inj, PF 07/05/2016   Influenza-Unspecified 08/26/2020   Moderna Sars-Covid-2 Vaccination 10/27/2019, 11/24/2019   Pneumococcal Polysaccharide-23 09/05/2014, 08/26/2020    Diabetes She presents for her follow-up diabetic visit. She has type 2 diabetes mellitus. Onset time: She was diagnosed at approximate age of 72. Her disease course has been improving. There are no hypoglycemic associated symptoms. Associated symptoms include blurred vision, fatigue, foot paresthesias and weight loss. Pertinent negatives for diabetes include no polydipsia, no polyphagia and no polyuria. There are no hypoglycemic complications. Symptoms are stable. Diabetic complications include a CVA, heart disease, nephropathy, peripheral neuropathy and retinopathy. (Legally blind in right eye, says she was hospitalized for DKA once; now on HD) Risk factors for coronary artery disease include diabetes mellitus, dyslipidemia, hypertension, obesity, sedentary lifestyle and tobacco exposure. Current diabetic treatment includes intensive insulin program (and Ozempic). She is compliant with treatment some of the time. Her weight is decreasing steadily. She is following a generally healthy diet. When asked about meal planning, she reported none. She has not had  a previous visit  with a dietitian. She rarely participates in exercise. Her home blood glucose trend is decreasing steadily. (She presents today with her CGM showing improving glycemic profile.  Her POCT A1c today is 7.9%, improving from last visit of 9%.  She says she struggled in September with healthy diet but is getting back on track.  She now has a house with her son and is back on the kidney transplant list.  Analysis of her CGM shows TIR 42%, TAR 58%, TBR 0% with a GMI of 8%.) An ACE inhibitor/angiotensin II receptor blocker is not being taken. She does not see a podiatrist.Eye exam is current.  Hyperlipidemia This is a chronic problem. The current episode started more than 1 year ago. The problem is uncontrolled. Recent lipid tests were reviewed and are high. Exacerbating diseases include chronic renal disease, diabetes and obesity. Factors aggravating her hyperlipidemia include beta blockers and fatty foods. Current antihyperlipidemic treatment includes statins. The current treatment provides moderate improvement of lipids. Compliance problems include adherence to diet and adherence to exercise.  Risk factors for coronary artery disease include diabetes mellitus, dyslipidemia, hypertension, obesity and a sedentary lifestyle.  Hypertension This is a chronic problem. The current episode started more than 1 year ago. The problem has been gradually improving since onset. The problem is controlled. Associated symptoms include blurred vision. There are no associated agents to hypertension. Risk factors for coronary artery disease include diabetes mellitus, dyslipidemia, obesity, sedentary lifestyle and smoking/tobacco exposure. Past treatments include calcium channel blockers, diuretics, central alpha agonists and beta blockers. The current treatment provides mild improvement. There are no compliance problems.  Hypertensive end-organ damage includes kidney disease, CAD/MI, CVA, heart failure and retinopathy. Identifiable  causes of hypertension include chronic renal disease.    Review of systems  Constitutional: + Minimally fluctuating body weight,  current Body mass index is 41.09 kg/m. , + fatigue, no subjective hyperthermia, no subjective hypothermia Eyes: + blurry vision (diabetic retinopathy and legally blind in Right eye), no xerophthalmia ENT: no sore throat, no nodules palpated in throat, no dysphagia/odynophagia, no hoarseness Cardiovascular: no chest pain, no shortness of breath, no palpitations, no leg swelling Respiratory: no cough, no shortness of breath Gastrointestinal: no nausea/vomiting/diarrhea Musculoskeletal: no muscle/joint aches Skin: no rashes, no hyperemia Neurological: no tremors, no dizziness, + numbness/tingling to BLE R>L Psychiatric: no depression, no anxiety   Objective:     BP 138/76 (BP Location: Left Arm, Patient Position: Sitting, Cuff Size: Normal)   Pulse 80   Ht 5\' 4"  (1.626 m)   Wt 239 lb 6.4 oz (108.6 kg)   LMP  (LMP Unknown)   BMI 41.09 kg/m   Wt Readings from Last 3 Encounters:  03/07/22 239 lb 6.4 oz (108.6 kg)  12/01/21 243 lb (110.2 kg)  08/30/21 266 lb 12.8 oz (121 kg)    BP Readings from Last 3 Encounters:  03/07/22 138/76  12/01/21 135/84  08/30/21 (!) 161/71     Physical Exam- Limited  Constitutional:  Body mass index is 41.09 kg/m. , not in acute distress, normal state of mind Eyes:  EOMI, no exophthalmos Neck: Supple Cardiovascular: RRR, no murmurs, rubs, or gallops, no edema Respiratory: Adequate breathing efforts, no crackles, rales, rhonchi, or wheezing Musculoskeletal: no gross deformities, strength intact in all four extremities, no gross restriction of joint movements Skin:  no rashes, no hyperemia Neurological: no tremor with outstretched hands   Diabetic Foot Exam - Simple   Simple Foot Form Diabetic Foot exam was performed  with the following findings: Yes 03/07/2022  1:53 PM  Visual Inspection No deformities, no  ulcerations, no other skin breakdown bilaterally: Yes Sensation Testing Intact to touch and monofilament testing bilaterally: Yes See comments: Yes Pulse Check Posterior Tibialis and Dorsalis pulse intact bilaterally: Yes Comments Decreased sensation to monofilament tool bilaterally     CMP ( most recent) CMP     Component Value Date/Time   NA 134 (L) 05/05/2021 0843   K 5.6 (H) 05/05/2021 0843   CL 102 05/05/2021 0843   CO2 24 02/25/2021 0712   GLUCOSE 216 (H) 05/05/2021 0843   BUN 31 (A) 07/22/2021 0000   CREATININE 4.90 (H) 05/05/2021 0843   CALCIUM 9.1 07/22/2021 0000   PROT 6.8 02/22/2019 1222   ALBUMIN 2.9 (L) 02/22/2019 1222   AST 16 02/22/2019 1222   ALT 13 02/22/2019 1222   ALKPHOS 60 02/22/2019 1222   BILITOT 0.6 02/22/2019 1222   GFRNONAA 10 (L) 02/25/2021 0712   GFRAA 12 (L) 03/04/2019 0616     Diabetic Labs (most recent): Lab Results  Component Value Date   HGBA1C 7.9 (A) 03/07/2022   HGBA1C 9.0 12/01/2021   HGBA1C 9.7 08/25/2021     Lipid Panel ( most recent) Lipid Panel     Component Value Date/Time   CHOL 245 (H) 05/23/2015 0545   TRIG 278 (H) 05/23/2015 0545   HDL 45 05/23/2015 0545   CHOLHDL 5.4 05/23/2015 0545   VLDL 56 (H) 05/23/2015 0545   LDLCALC 144 (H) 05/23/2015 0545      Lab Results  Component Value Date   TSH 1.524 09/04/2014           Assessment & Plan:   1) Uncontrolled diabetes mellitus with stage 4 chronic kidney disease ([place])  She presents today with her CGM showing improving glycemic profile.  Her POCT A1c today is 7.9%, improving from last visit of 9%.  She says she struggled in September with healthy diet but is getting back on track.  She now has a house with her son and is back on the kidney transplant list.  Analysis of her CGM shows TIR 42%, TAR 58%, TBR 0% with a GMI of 8%.  - Samantha Pierce has currently uncontrolled symptomatic type 2 DM since 51 years of age.  -Recent labs reviewed.  - I had a long  discussion with her about the progressive nature of diabetes and the pathology behind its complications. -her diabetes is complicated by CVA, retinopathy, neuropathy, CKD, and DKA x 1 episode and she remains at a high risk for more acute and chronic complications which include worsening CAD, CVA, CKD, retinopathy, and neuropathy. These are all discussed in detail with her.  - Nutritional counseling repeated at each appointment due to patients tendency to fall back in to old habits.  - The patient admits there is a room for improvement in their diet and drink choices. -  Suggestion is made for the patient to avoid simple carbohydrates from their diet including Cakes, Sweet Desserts / Pastries, Ice Cream, Soda (diet and regular), Sweet Tea, Candies, Chips, Cookies, Sweet Pastries, Store Bought Juices, Alcohol in Excess of 1-2 drinks a day, Artificial Sweeteners, Coffee Creamer, and "Sugar-free" Products. This will help patient to have stable blood glucose profile and potentially avoid unintended weight gain.   - I encouraged the patient to switch to unprocessed or minimally processed complex starch and increased protein intake (animal or plant source), fruits, and vegetables.   - Patient is  advised to stick to a routine mealtimes to eat 3 meals a day and avoid unnecessary snacks (to snack only to correct hypoglycemia).  - she will be scheduled with Jearld Fenton, RDN, CDE for diabetes education.  - I have approached her with the following individualized plan to manage  her diabetes and patient agrees:   -She is advised to lower her dose of Levemir to 50 units SQ nightly and continue same dose of Novolog 10-16 units TID with meals if glucose is above 90 and she is eating (Specific instructions on how to titrate insulin dosage based on glucose readings given to patient in writing).  I did increaes her Ozempic to 1 mg SQ weekly to continue to aide her in weight loss attempts and reduce insulin use.     -she is encouraged to continue monitoring blood glucose 4 times daily (using her CGM), before meals and before bed, and to call the clinic if she has readings less than 70 or greater than 300 for 3 tests in a row.   - she is warned not to take insulin without proper monitoring per orders. - Adjustment parameters are given to her for hypo and hyperglycemia in writing.  - she is not a candidate for Metformin, SGLT2 due to concurrent renal insufficiency.  - Specific targets for  A1c;  LDL, HDL,  and Triglycerides were discussed with the patient.  2) Blood Pressure /Hypertension:  her blood pressure is controlled to target.   she is advised to continue her current medications including Norvasc 10 mg po daily, Clonidine 0.2 mg po daily, Lasix 80 mg po twice daily, Hydralazine 50 mg po twice daily and Metoprolol 50 mg  mg p.o. daily with breakfast.  Will defer any med changes to nephrology.  3) Lipids/Hyperlipidemia:    There is no recent lipid panel to review.  She is advised to continue Lipitor 80 mg po daily before bed.  Side effects and precautions discussed with her.  Will recheck lipid panel prior to next visit.  4)  Weight/Diet:  her Body mass index is 41.09 kg/m.  -  clearly complicating her diabetes care.   she is a candidate for weight loss. I discussed with her the fact that loss of 5 - 10% of her  current body weight will have the most impact on her diabetes management.  Exercise, and detailed carbohydrates information provided  -  detailed on discharge instructions.  5) Chronic Care/Health Maintenance: -she is not on ACEI/ARB and is on Statin medications and is encouraged to initiate and continue to follow up with Ophthalmology, Dentist, Podiatrist at least yearly or according to recommendations, and advised to stay away from smoking. I have recommended yearly flu vaccine and pneumonia vaccine at least every 5 years; moderate intensity exercise for up to 150 minutes weekly; and sleep  for at least 7 hours a day.  - she is advised to maintain close follow up with Carrolyn Meiers, MD for primary care needs, as well as her other providers for optimal and coordinated care.     I spent 30 minutes in the care of the patient today including review of labs from [place], Lipids, Thyroid Function, Hematology (current and previous including abstractions from other facilities); face-to-face time discussing  her blood glucose readings/logs, discussing hypoglycemia and hyperglycemia episodes and symptoms, medications doses, her options of short and long term treatment based on the latest standards of care / guidelines;  discussion about incorporating lifestyle medicine;  and documenting the  encounter. Risk reduction counseling performed per USPSTF guidelines to reduce obesity and cardiovascular risk factors.     Please refer to Patient Instructions for Blood Glucose Monitoring and Insulin/Medications Dosing Guide"  in media tab for additional information. Please  also refer to " Patient Self Inventory" in the Media  tab for reviewed elements of pertinent patient history.  Samantha Pierce participated in the discussions, expressed understanding, and voiced agreement with the above plans.  All questions were answered to her satisfaction. she is encouraged to contact clinic should she have any questions or concerns prior to her return visit.   Follow up plan: - Return in about 4 months (around 07/08/2022) for Diabetes F/U with A1c in office, Previsit labs, Bring meter and logs.  [name], [facility] [address] [place], [place] [address] Phone: [phone] Fax: [phone]  03/07/2022, 1:57 PM

## 2022-03-21 ENCOUNTER — Encounter (INDEPENDENT_AMBULATORY_CARE_PROVIDER_SITE_OTHER): Payer: Self-pay | Admitting: *Deleted

## 2022-04-18 ENCOUNTER — Ambulatory Visit (HOSPITAL_COMMUNITY): Payer: Medicare HMO | Admitting: Physical Therapy

## 2022-05-04 ENCOUNTER — Encounter: Payer: Self-pay | Admitting: Internal Medicine

## 2022-05-04 ENCOUNTER — Ambulatory Visit (INDEPENDENT_AMBULATORY_CARE_PROVIDER_SITE_OTHER): Payer: Medicare HMO | Admitting: Internal Medicine

## 2022-05-04 VITALS — BP 154/77 | HR 81 | Temp 98.1°F | Ht 64.0 in | Wt 231.8 lb

## 2022-05-04 DIAGNOSIS — K219 Gastro-esophageal reflux disease without esophagitis: Secondary | ICD-10-CM | POA: Diagnosis not present

## 2022-05-04 DIAGNOSIS — K5904 Chronic idiopathic constipation: Secondary | ICD-10-CM

## 2022-05-04 DIAGNOSIS — Z1211 Encounter for screening for malignant neoplasm of colon: Secondary | ICD-10-CM

## 2022-05-04 NOTE — Patient Instructions (Signed)
For your constipation, I want you to start taking over the counter MiraLAX 1 capful daily.  If this does not adequately control your constipation, I would increase to 2 capfuls daily.  If this is still not adequate, then I would add on once daily Dulcolax (bisacodyl) tablet.   Be sure to drink at least 4 to 6 glasses of water daily.   Continue on omeprazole for your chronic reflux.  We will schedule you for colonoscopy for colon cancer screening purposes.  It was very nice meeting you today.  Dr. Abbey Chatters

## 2022-05-04 NOTE — Progress Notes (Signed)
Primary Care Physician:  Willene Hatchet, NP Primary Gastroenterologist:  Dr. Abbey Chatters  Chief Complaint  Patient presents with   New Patient (Initial Visit)    Referred for constipation and future colonoscopy    HPI:   Samantha Pierce is a 51 y.o. female who presents to the clinic today by referral from her PCP Cletis Athens for evaluation.  Patient has chronic GERD which is well-controlled on omeprazole daily.  No dysphagia odynophagia.  No epigastric or chest pain.  No previous colonoscopy.  No family history of colorectal malignancy.  No melena hematochezia.  Undergoing kidney transplant evaluation and wishes to have colonoscopy done.  Does have chronic constipation which has progressively worsened over the years.  Averages 1-2 bowel movements a week.  Has not tried any medications for this.  Intermittent, mild to moderate abdominal pain, left lower quadrant.  Does not radiate  Patient is a [employer].  Originally from [place].  Moved here 11 years ago.  Past Medical History:  Diagnosis Date   Anemia    Anxiety    Asystole ([place])    a. 05/2015: pt developed bradycardia->asystole during [place] nuclear stress test, s/p brief code. Cath with only 30% prox LCx. Her asystole during Lexiscan infusion was felt to represent an excessive pharmacologic response to the agent and likely superimposed vasovagal response.   Bipolar disorder ([place])    CKD (chronic kidney disease) stage 4, GFR 15-29 ml/min (HCC)    M_W_F dialysis   COPD (chronic obstructive pulmonary disease) (HCC)    Depression    Essential hypertension    GERD (gastroesophageal reflux disease)    History of blood transfusion 2011   History of seizure 2017   only one seizure - none since   History of stroke 09/2014   Acute lacunar infarct of the left thalamus   Leg cramps    Mild CAD    a. LHC 05/2015: 30% prox Cx, otherwise widely patent.   Morbid obesity ([place])    Neuromuscular disorder ([place])     neuropathy in feet   Neuropathy    feet   PTSD (post-traumatic stress disorder)    Stroke (HCC)    x 2, right side weakness   Type 2 diabetes mellitus ([place])     Past Surgical History:  Procedure Laterality Date   AV FISTULA PLACEMENT Right 01/08/2017   Procedure: RIGHT ARM BRACHIOCEPHALIC ARTERIOVENOUS (AV) FISTULA CREATION;  Surgeon: Waynetta Sandy, MD;  Location: [facility];  Service: Vascular;  Laterality: Right;   AV FISTULA PLACEMENT Left 11/20/2018   Procedure: Creation of LEFT ARM ARTERIOVENOUS (AV) FISTULA;  Surgeon: Waynetta Sandy, MD;  Location: [facility];  Service: Vascular;  Laterality: Left;   AV FISTULA PLACEMENT Right 02/24/2021   Procedure: RIGHT UPPER ARM BASILIC VEIN ARTERIOVENOUS (AV) FISTULA CREATION;  Surgeon: Cherre Robins, MD;  Location: [facility];  Service: Vascular;  Laterality: Right;   AV FISTULA PLACEMENT Right 05/05/2021   Procedure: INSERTION OF ARTERIOVENOUS (AV) GORE-TEX GRAFT ARM;  Surgeon: Cherre Robins, MD;  Location: [facility];  Service: Vascular;  Laterality: Right;   [place] Left 02/11/2019   Procedure: BASILIC VEIN TRANSPOSITION SECOND STAGE;  Surgeon: Waynetta Sandy, MD;  Location: [facility];  Service: Vascular;  Laterality: Left;   CARDIAC CATHETERIZATION N/A 06/01/2015   Procedure: Left Heart Cath and Coronary Angiography;  Surgeon: Belva Crome, MD; CFX 30%, no other dz, EF nl by echo   CESAREAN  SECTION     x2   DILATION AND CURETTAGE OF UTERUS     EYE SURGERY     Right eye pars plano vitrectomy    HEMATOMA EVACUATION Left 03/03/2019   Procedure: EVACUATION HEMATOMA;  Surgeon: Elam Dutch, MD;  Location: [facility];  Service: Vascular;  Laterality: Left;   INSERTION OF DIALYSIS CATHETER Right 01/04/2021   Procedure: INSERTION OF RIGHT INTERNAL JUGULAR TUNNELED DIALYSIS CATHETER;  Surgeon: Elam Dutch, MD;  Location: [facility];  Service: Vascular;  Laterality: Right;   LASIK     LIGATION OF ARTERIOVENOUS   FISTULA Left 01/04/2021   Procedure: LIGATION OF LEFT ARM ARTERIOVENOUS  FISTULA;  Surgeon: Elam Dutch, MD;  Location: [facility];  Service: Vascular;  Laterality: Left;   PARS PLANA VITRECTOMY  04/20/2011   Procedure: PARS PLANA VITRECTOMY WITH 25 GAUGE;  Surgeon: Hayden Pedro, MD;  Location: [facility];  Service: Ophthalmology;  Laterality: Left;  membrane peel, gas fluid exchange, endolaser, repair of complex retinal detachment   PATCH ANGIOPLASTY Left 10/05/2020   Procedure: PATCH ANGIOPLASTY USING Rueben Bash BIOLOGIC PATCH;  Surgeon: Elam Dutch, MD;  Location: [facility];  Service: Vascular;  Laterality: Left;   PERIPHERAL VASCULAR BALLOON ANGIOPLASTY Left 09/02/2020   Procedure: PERIPHERAL VASCULAR BALLOON ANGIOPLASTY;  Surgeon: Marty Heck, MD;  Location: [facility];  Service: Cardiovascular;  Laterality: Left;  arm fistula   REMOVAL OF A DIALYSIS CATHETER N/A 06/21/2021   Procedure: MINOR REMOVAL OF TUNNELED DIALYSIS CATHETER;  Surgeon: Rosetta Posner, MD;  Location: [facility];  Service: Vascular;  Laterality: N/A;   REVISON OF ARTERIOVENOUS FISTULA Left 10/05/2020   Procedure: REVISION OF LEFT ARM ARTERIOVENOUS FISTULA;  Surgeon: Elam Dutch, MD;  Location[facility];  Service: Vascular;  Laterality: Left;   TUBAL LIGATION      Current Outpatient Medications  Medication Sig Dispense Refill   amLODipine (NORVASC) 10 MG tablet Take 1 tablet (10 mg total) by mouth daily. 30 tablet 1   atorvastatin (LIPITOR) 80 MG tablet Take 1 tablet (80 mg total) by mouth daily at 6 PM. (Patient taking differently: Take 80 mg by mouth at bedtime.) 30 tablet 6   cloNIDine (CATAPRES) 0.2 MG tablet Take 0.2 mg by mouth 2 (two) times daily.     FLUoxetine (PROZAC) 10 MG tablet Take 10 mg by mouth See admin instructions. Take with 20 mg for a total of 30 mg daily     FLUoxetine (PROZAC) 20 MG capsule Take 20 mg by mouth See admin instructions. Take with 10 mg for a total of 30 mg daily     furosemide  (LASIX) 80 MG tablet Take 80 mg by mouth 2 (two) times daily with breakfast and lunch.     gabapentin (NEURONTIN) 300 MG capsule TAKE (1) CAPSULE BY MOUTH DAILY. 30 capsule 0   hydrALAZINE (APRESOLINE) 50 MG tablet Take 50 mg by mouth 2 (two) times daily.     hydrOXYzine (ATARAX) 10 MG tablet Take 10 mg by mouth 3 (three) times daily as needed.     insulin detemir (LEVEMIR) 100 UNIT/ML FlexPen Inject 50 Units into the skin at bedtime. 15 mL    Lancets (ONETOUCH DELICA PLUS [id number]) MISC Apply topically.     metoprolol succinate (TOPROL-XL) 50 MG 24 hr tablet Take 50 mg by mouth daily.     NOVOLOG FLEXPEN 100 UNIT/ML FlexPen INJECT 10-16 UNITS INTO THE SKIN 3 TIMES DAILY WUTH MEALS. 15 mL 1  omeprazole (PRILOSEC) 20 MG capsule Take 20 mg by mouth daily before breakfast.      ONETOUCH VERIO test strip SMARTSIG:Via Meter     Semaglutide, 1 MG/DOSE, 4 MG/3ML SOPN Inject 1 mg as directed once a week. 6 mL 3   traZODone (DESYREL) 100 MG tablet Take 100 mg by mouth at bedtime.     albuterol (PROVENTIL HFA;VENTOLIN HFA) 108 (90 BASE) MCG/ACT inhaler Inhale 1-2 puffs into the lungs every 6 (six) hours as needed for wheezing or shortness of breath. (Patient not taking: Reported on 05/04/2022) 1 Inhaler 0   Continuous Blood Gluc Receiver (DEXCOM G6 RECEIVER) DEVI USE TO CHECK GLUCOSE FOUR TIMES DAILY AS DIRECTED. (Patient not taking: Reported on 05/04/2022) 1 each 0   OLANZapine (ZYPREXA) 2.5 MG tablet Take 2.5 mg by mouth at bedtime. (Patient not taking: Reported on 05/04/2022)     No current facility-administered medications for this visit.    Allergies as of 05/04/2022 - Review Complete 05/04/2022  Allergen Reaction Noted   Contrast media [iodinated contrast media] Anaphylaxis 01/04/2017   Penicillins Rash and Other (See Comments) 04/18/2011    Family History  Problem Relation Age of Onset   Diabetes Mother    Kidney failure Mother    Diabetes Father    Amblyopia Neg Hx    Blindness Neg Hx     Cataracts Neg Hx    Glaucoma Neg Hx    Macular degeneration Neg Hx    Retinal detachment Neg Hx    Strabismus Neg Hx    Retinitis pigmentosa Neg Hx     Social History   Socioeconomic History   Marital status: Single    Spouse name: Not on file   Number of children: Not on file   Years of education: Not on file   Highest education level: Not on file  Occupational History   Not on file  Tobacco Use   Smoking status: Every Day    Packs/day: 0.03    Years: 30.00    Total pack years: 0.90    Types: Cigarettes    Start date: 06/05/1981   Smokeless tobacco: Never   Tobacco comments:    Quit in 12/2018 but now smokes off and on per patient 05/04/21.  Vaping Use   Vaping Use: Never used  Substance and Sexual Activity   Alcohol use: Yes    Comment: occ wine   Drug use: No   Sexual activity: Yes    Birth control/protection: Surgical    Comment: Tubal Ligation  Other Topics Concern   Not on file  Social History Narrative   Not on file   Social Determinants of Health   Financial Resource Strain: Not on file  Food Insecurity: Not on file  Transportation Needs: Not on file  Physical Activity: Not on file  Stress: Not on file  Social Connections: Not on file  Intimate Partner Violence: Not on file    Subjective: Review of Systems  Constitutional:  Negative for chills and fever.  HENT:  Negative for congestion and hearing loss.   Eyes:  Negative for blurred vision and double vision.  Respiratory:  Negative for cough and shortness of breath.   Cardiovascular:  Negative for chest pain and palpitations.  Gastrointestinal:  Positive for constipation and heartburn. Negative for abdominal pain, blood in stool, diarrhea, melena and vomiting.  Genitourinary:  Negative for dysuria and urgency.  Musculoskeletal:  Negative for joint pain and myalgias.  Skin:  Negative for itching and rash.  Neurological:  Negative for dizziness and headaches.  Psychiatric/Behavioral:  Negative  for depression. The patient is not nervous/anxious.        Objective: BP (!) 154/77   Pulse 81   Temp 98.1 F (36.7 C)   Ht 5\' 4"  (1.626 m)   Wt 231 lb 12.8 oz (105.1 kg)   LMP  (LMP Unknown)   BMI 39.79 kg/m  Physical Exam Constitutional:      Appearance: Normal appearance.  HENT:     Head: Normocephalic and atraumatic.  Eyes:     Extraocular Movements: Extraocular movements intact.     Conjunctiva/sclera: Conjunctivae normal.  Cardiovascular:     Rate and Rhythm: Normal rate and regular rhythm.  Pulmonary:     Effort: Pulmonary effort is normal.     Breath sounds: Normal breath sounds.  Abdominal:     General: Bowel sounds are normal.     Palpations: Abdomen is soft.  Musculoskeletal:        General: No swelling. Normal range of motion.     Cervical back: Normal range of motion and neck supple.  Skin:    General: Skin is warm and dry.     Coloration: Skin is not jaundiced.  Neurological:     General: No focal deficit present.     Mental Status: She is alert and oriented to person, place, and time.  Psychiatric:        Mood and Affect: Mood normal.        Behavior: Behavior normal.      Assessment: *Chronic GERD-well controlled on Omeprazole 20 mg daily *Chronic idiopathic constipation  *Colon cancer screening    Plan: Chronic GERD well-controlled omeprazole 20 mg daily.  Will continue. Symptoms today to warrant upper endoscopy.  Will schedule for screening colonoscopy.The risks including infection, bleed, or perforation as well as benefits, limitations, alternatives and imponderables have been reviewed with the patient. Questions have been answered. All parties agreeable.  Patient with ESRD on dialysis Monday Wednesday Friday.  Procedure will need to be done on Thursday.  Renal friendly prep.  For patient's constipation, recommended patient start taking over the counter MiraLAX 1 capful daily.  If this does not adequately control her constipation, I  would increase to 2 capfuls daily.  If this is still not adequate, then add on once daily Dulcolax (bisacodyl) tablet.  Thank you Cletis Athens for the kind referral.   05/04/2022 9:47 AM   Disclaimer: This note was dictated with voice recognition software. Similar sounding words can inadvertently be transcribed and may not be corrected upon review.

## 2022-05-08 ENCOUNTER — Other Ambulatory Visit: Payer: Self-pay | Admitting: Vascular Surgery

## 2022-05-11 ENCOUNTER — Telehealth: Payer: Self-pay | Admitting: *Deleted

## 2022-05-11 NOTE — Telephone Encounter (Signed)
Called pt to schedule colonoscopy and pt stated she wants to wait until February. Will call once we get the providers schedule.

## 2022-06-07 ENCOUNTER — Other Ambulatory Visit: Payer: Self-pay | Admitting: Vascular Surgery

## 2022-06-22 ENCOUNTER — Encounter (INDEPENDENT_AMBULATORY_CARE_PROVIDER_SITE_OTHER): Payer: Self-pay | Admitting: *Deleted

## 2022-06-22 ENCOUNTER — Telehealth (INDEPENDENT_AMBULATORY_CARE_PROVIDER_SITE_OTHER): Payer: Self-pay | Admitting: *Deleted

## 2022-06-22 MED ORDER — PEG 3350-KCL-NA BICARB-NACL 420 G PO SOLR: 4000.0000 mL | Freq: Once | ORAL | 0 refills | Status: AC

## 2022-06-22 NOTE — Telephone Encounter (Signed)
Called pt. Scheduled for TCS with Dr. Abbey Chatters ASA 3 on 2/22 at 930am. Has dialysis M,W,F. Aware will send prep rx to [facility]. Instructions mailed.

## 2022-07-04 ENCOUNTER — Other Ambulatory Visit: Payer: Self-pay | Admitting: Vascular Surgery

## 2022-07-11 ENCOUNTER — Ambulatory Visit: Payer: Medicare HMO | Admitting: Nurse Practitioner

## 2022-07-13 ENCOUNTER — Ambulatory Visit: Payer: 59 | Attending: Nurse Practitioner | Admitting: Nurse Practitioner

## 2022-07-13 ENCOUNTER — Encounter: Payer: Self-pay | Admitting: Nurse Practitioner

## 2022-07-13 VITALS — BP 140/75 | HR 87 | Ht 64.0 in | Wt 230.4 lb

## 2022-07-13 DIAGNOSIS — N186 End stage renal disease: Secondary | ICD-10-CM | POA: Diagnosis not present

## 2022-07-13 DIAGNOSIS — I2510 Atherosclerotic heart disease of native coronary artery without angina pectoris: Secondary | ICD-10-CM | POA: Diagnosis not present

## 2022-07-13 DIAGNOSIS — I10 Essential (primary) hypertension: Secondary | ICD-10-CM | POA: Diagnosis not present

## 2022-07-13 DIAGNOSIS — E785 Hyperlipidemia, unspecified: Secondary | ICD-10-CM | POA: Diagnosis not present

## 2022-07-13 DIAGNOSIS — Z992 Dependence on renal dialysis: Secondary | ICD-10-CM

## 2022-07-13 DIAGNOSIS — E7849 Other hyperlipidemia: Secondary | ICD-10-CM

## 2022-07-13 DIAGNOSIS — Z79899 Other long term (current) drug therapy: Secondary | ICD-10-CM

## 2022-07-13 MED ORDER — METOPROLOL SUCCINATE ER 50 MG PO TB24: 75.0000 mg | ORAL_TABLET | Freq: Every day | ORAL | 6 refills | Status: DC

## 2022-07-13 NOTE — Progress Notes (Signed)
Cardiology Office Note:    Date:  07/13/2022  ID:  Samantha Pierce, DOB Jul 06, 1970, MRN [id number]  PCP:  Willene Hatchet, NP   [place] [facility] Cardiologist:  Rozann Lesches, MD     Referring MD: Willene Hatchet, NP   CC: Here for 1 year follow-up  History of Present Illness:    Samantha Pierce is a 52 y.o. female with a hx of the following:  Hx of Asystole during [place] CAD Hyperlipidemia Hypertension Type 2 diabetes Prior strokes End-stage renal disease (Monday/Wednesday/Friday schedule)  Previous CV hx includes CAD, s/p cath in 2016 following aystole Lexiscan. Cardiac cath revealed 30% proximal-LAD stenosis, no evidence of obstructive disease.   She has been closely followed by vascular surgery and underwent ligation of left arm AV fistula in 2022 due to moderate to severe steal symptoms.  Followed by nephrology.  Last seen by Bernerd Pho, PA-C on January 06, 2021.  Patient noticed improvement in paresthesias and numbness along left arm and hand.  Was doing well on hemodialysis.  BP was well-controlled.  Blood pressure was elevated in office that day if she had not taken her morning medications.  Overall was doing well from a cardiac perspective.  [place] encouraged her to follow-up with vascular to see if she could restart baby aspirin given history of CAD and prior history of stroke.  No medication changes were made.  Was told to follow-up in 1 year.  Today she presents for 1 year follow-up.  She states she is doing well. Tolerating dialysis well. Says she is on the kidney transplant list. Denies any chest pain, shortness of breath, palpitations, syncope, presyncope, dizziness, orthopnea, PND, swelling or significant weight changes, acute bleeding, or claudication. Has been losing weight, has lost 9 lbs since 03/2022.   Past Medical History:  Diagnosis Date   Anemia    Anxiety    Asystole ([place])    a. 05/2015: pt developed  bradycardia->asystole during [facility] nuclear stress test, s/p brief code. Cath with only 30% prox LCx. Her asystole during Lexiscan infusion was felt to represent an excessive pharmacologic response to the agent and likely superimposed vasovagal response.   Bipolar disorder ([place])    CKD (chronic kidney disease) stage 4, GFR 15-29 ml/min (HCC)    M_W_F dialysis   COPD (chronic obstructive pulmonary disease) (HCC)    Depression    Essential hypertension    GERD (gastroesophageal reflux disease)    History of blood transfusion 2011   History of seizure 2017   only one seizure - none since   History of stroke 09/2014   Acute lacunar infarct of the left thalamus   Leg cramps    Mild CAD    a. LHC 05/2015: 30% prox Cx, otherwise widely patent.   Morbid obesity ([place])    Neuromuscular disorder ([place])    neuropathy in feet   Neuropathy    feet   PTSD (post-traumatic stress disorder)    Stroke (HCC)    x 2, right side weakness   Type 2 diabetes mellitus ([place])     Past Surgical History:  Procedure Laterality Date   AV FISTULA PLACEMENT Right 01/08/2017   Procedure: RIGHT ARM BRACHIOCEPHALIC ARTERIOVENOUS (AV) FISTULA CREATION;  Surgeon: Waynetta Sandy, MD;  Location: [facility];  Service: Vascular;  Laterality: Right;   AV FISTULA PLACEMENT Left 11/20/2018   Procedure: Creation of LEFT ARM ARTERIOVENOUS (AV) FISTULA;  Surgeon: Waynetta Sandy, MD;  Location: [facility];  Service:  Vascular;  Laterality: Left;   AV FISTULA PLACEMENT Right 02/24/2021   Procedure: RIGHT UPPER ARM BASILIC VEIN ARTERIOVENOUS (AV) FISTULA CREATION;  Surgeon: Cherre Robins, MD;  Location: [facility];  Service: Vascular;  Laterality: Right;   AV FISTULA PLACEMENT Right 05/05/2021   Procedure: INSERTION OF ARTERIOVENOUS (AV) GORE-TEX GRAFT ARM;  Surgeon: Cherre Robins, MD;  Location: [facility];  Service: Vascular;  Laterality: Right;   [place] Left 02/11/2019   Procedure: BASILIC VEIN  TRANSPOSITION SECOND STAGE;  Surgeon: Waynetta Sandy, MD;  Location: [facility];  Service: Vascular;  Laterality: Left;   CARDIAC CATHETERIZATION N/A 06/01/2015   Procedure: Left Heart Cath and Coronary Angiography;  Surgeon: Belva Crome, MD; CFX 30%, no other dz, EF nl by echo   CESAREAN SECTION     x2   [place]     Right eye pars plano vitrectomy    HEMATOMA EVACUATION Left 03/03/2019   Procedure: EVACUATION HEMATOMA;  Surgeon: Elam Dutch, MD;  Location: [facility];  Service: Vascular;  Laterality: Left;   INSERTION OF DIALYSIS CATHETER Right 01/04/2021   Procedure: INSERTION OF RIGHT INTERNAL JUGULAR TUNNELED DIALYSIS CATHETER;  Surgeon: Elam Dutch, MD;  Location: [facility];  Service: Vascular;  Laterality: Right;   LASIK     LIGATION OF ARTERIOVENOUS  FISTULA Left 01/04/2021   Procedure: LIGATION OF LEFT ARM ARTERIOVENOUS  FISTULA;  Surgeon: Elam Dutch, MD;  Location: [facility];  Service: Vascular;  Laterality: Left;   PARS PLANA VITRECTOMY  04/20/2011   Procedure: PARS PLANA VITRECTOMY WITH 25 GAUGE;  Surgeon: Hayden Pedro, MD;  Location: [facility];  Service: Ophthalmology;  Laterality: Left;  membrane peel, gas fluid exchange, endolaser, repair of complex retinal detachment   PATCH ANGIOPLASTY Left 10/05/2020   Procedure: PATCH ANGIOPLASTY USING Rueben Bash BIOLOGIC PATCH;  Surgeon: Elam Dutch, MD;  Location: [facility];  Service: Vascular;  Laterality: Left;   PERIPHERAL VASCULAR BALLOON ANGIOPLASTY Left 09/02/2020   Procedure: PERIPHERAL VASCULAR BALLOON ANGIOPLASTY;  Surgeon: Marty Heck, MD;  Location: [facility];  Service: Cardiovascular;  Laterality: Left;  arm fistula   REMOVAL OF A DIALYSIS CATHETER N/A 06/21/2021   Procedure: MINOR REMOVAL OF TUNNELED DIALYSIS CATHETER;  Surgeon: Rosetta Posner, MD;  Location: [facility];  Service: Vascular;  Laterality: N/A;   REVISON OF ARTERIOVENOUS FISTULA Left 10/05/2020   Procedure:  REVISION OF LEFT ARM ARTERIOVENOUS FISTULA;  Surgeon: Elam Dutch, MD;  [facility];  Service: Vascular;  Laterality: Left;   TUBAL LIGATION      Current Medications: Current Meds  Medication Sig   amLODipine (NORVASC) 10 MG tablet Take 1 tablet (10 mg total) by mouth daily.   atorvastatin (LIPITOR) 80 MG tablet Take 1 tablet (80 mg total) by mouth daily at 6 PM. (Patient taking differently: Take 80 mg by mouth at bedtime.)   cloNIDine (CATAPRES) 0.2 MG tablet Take 0.2 mg by mouth 2 (two) times daily.   Continuous Blood Gluc Receiver (DEXCOM G6 RECEIVER) DEVI USE TO CHECK GLUCOSE FOUR TIMES DAILY AS DIRECTED.   FLUoxetine (PROZAC) 10 MG tablet Take 10 mg by mouth See admin instructions. Take with 20 mg for a total of 30 mg daily   FLUoxetine (PROZAC) 20 MG capsule Take 20 mg by mouth See admin instructions. Take with 10 mg for a total of 30 mg daily   furosemide (LASIX) 80  MG tablet Take 80 mg by mouth 2 (two) times daily with breakfast and lunch.   gabapentin (NEURONTIN) 300 MG capsule TAKE (1) CAPSULE BY MOUTH DAILY.   hydrALAZINE (APRESOLINE) 50 MG tablet Take 50 mg by mouth 2 (two) times daily.   hydrOXYzine (ATARAX) 10 MG tablet Take 10 mg by mouth 3 (three) times daily as needed.   insulin detemir (LEVEMIR) 100 UNIT/ML FlexPen Inject 50 Units into the skin at bedtime.   Lancets (ONETOUCH DELICA PLUS 123XX123) MISC Apply topically.   NOVOLOG FLEXPEN 100 UNIT/ML FlexPen INJECT 10-16 UNITS INTO THE SKIN 3 TIMES DAILY WUTH MEALS.   OLANZapine (ZYPREXA) 2.5 MG tablet Take 2.5 mg by mouth at bedtime.   omeprazole (PRILOSEC) 20 MG capsule Take 20 mg by mouth daily before breakfast.    ONETOUCH VERIO test strip SMARTSIG:Via Meter   Semaglutide, 1 MG/DOSE, 4 MG/3ML SOPN Inject 1 mg as directed once a week.   traZODone (DESYREL) 100 MG tablet Take 100 mg by mouth at bedtime.    metoprolol succinate (TOPROL-XL) 50 MG 24 hr tablet Take 50 mg by mouth daily.     Allergies:   Contrast  media [iodinated contrast media] and Penicillins   Social History   Socioeconomic History   Marital status: Single    Spouse name: Not on file   Number of children: Not on file   Years of education: Not on file   Highest education level: Not on file  Occupational History   Not on file  Tobacco Use   Smoking status: Every Day    Packs/day: 0.03    Years: 30.00    Total pack years: 0.90    Types: Cigarettes    Start date: 06/05/1981   Smokeless tobacco: Never   Tobacco comments:    Quit in 12/2018 but now smokes off and on per patient 05/04/21.  Vaping Use   Vaping Use: Never used  Substance and Sexual Activity   Alcohol use: Yes    Comment: occ wine   Drug use: No   Sexual activity: Yes    Birth control/protection: Surgical    Comment: Tubal Ligation  Other Topics Concern   Not on file  Social History Narrative   Not on file   Social Determinants of Health   Financial Resource Strain: Not on file  Food Insecurity: Not on file  Transportation Needs: Not on file  Physical Activity: Not on file  Stress: Not on file  Social Connections: Not on file     Family History: The patient's family history includes Diabetes in her father and mother; Kidney failure in her mother. There is no history of Amblyopia, Blindness, Cataracts, Glaucoma, Macular degeneration, Retinal detachment, Strabismus, or Retinitis pigmentosa.  ROS:   Review of Systems  Constitutional: Negative.   HENT: Negative.    Eyes: Negative.   Respiratory: Negative.    Cardiovascular: Negative.   Gastrointestinal: Negative.   Genitourinary: Negative.   Musculoskeletal: Negative.   Skin: Negative.   Neurological: Negative.   Endo/Heme/Allergies: Negative.   Psychiatric/Behavioral: Negative.      Please see the history of present illness.     All other systems reviewed and are negative.  EKGs/Labs/Other Studies Reviewed:    The following studies were reviewed today:   EKG:  EKG is ordered today.   The ekg ordered today demonstrates NSR, 83 bpm, LVH, no acute ischemic changes.    Echocardiogram on 01/20/2019:  1. The left ventricle has normal systolic function with an ejection  fraction of 60-65%. The cavity size was normal. There is moderately  increased left ventricular wall thickness. Left ventricular diastolic  Doppler parameters are indeterminate.   2. The right ventricle has normal systolic function. The cavity was  normal. There is no increase in right ventricular wall thickness.   3. Left atrial size was severely dilated.   4. Right atrial size was mildly dilated.   5. No evidence of mitral valve stenosis.   6. The aortic valve is tricuspid. No stenosis of the aortic valve.   7. The aorta is normal unless otherwise noted.   8. There is dilatation of the aortic root.   9. Pulmonary hypertension is indeterminate, inadequate TR jet.  10. The inferior vena cava was dilated in size with >50% respiratory  variability.  Left heart cath on 06/01/2015: Prox Cx lesion, 30% stenosed.   The coronary arteries are widely patent with minimal plaque noted. 40 cc of contrast was used Left ventricular end-diastolic pressure is mildly elevated. Left ventriculography was not performed in an attempt to minimize contrast exposure. Asystole during adenosine infusion represents an excessive pharmacologic response to the agent and likely superimposed vasovagal response.  Recent Labs: 07/22/2021: BUN 31; Hemoglobin 12.2  Recent Lipid Panel    Component Value Date/Time   CHOL 245 (H) 05/23/2015 0545   TRIG 278 (H) 05/23/2015 0545   HDL 45 05/23/2015 0545   CHOLHDL 5.4 05/23/2015 0545   VLDL 56 (H) 05/23/2015 0545   LDLCALC 144 (H) 05/23/2015 0545    Physical Exam:    VS:  BP (!) 140/75 (BP Location: Other (Comment), Patient Position: Sitting, Cuff Size: Normal) Comment (BP Location): left forearm; cannot check on right arm, dexcom meter on L upper arm  Pulse 87   Ht 5' 4"$  (1.626 m)   Wt  230 lb 6.4 oz (104.5 kg)   LMP  (LMP Unknown)   SpO2 100%   BMI 39.55 kg/m     Wt Readings from Last 3 Encounters:  07/13/22 230 lb 6.4 oz (104.5 kg)  05/04/22 231 lb 12.8 oz (105.1 kg)  03/07/22 239 lb 6.4 oz (108.6 kg)     GEN: Obese, 52 y.o. female in no acute distress HEENT: Normal NECK: No JVD; No carotid bruits CARDIAC: S1/S2, RRR, no murmurs, rubs, gallops; 2+ pulses RESPIRATORY:  Clear to auscultation without rales, wheezing or rhonchi  MUSCULOSKELETAL:  No edema; No deformity  SKIN: Warm and dry NEUROLOGIC:  Alert and oriented x 3 PSYCHIATRIC:  Normal affect   ASSESSMENT:    1. Coronary artery disease involving native coronary artery of native heart without angina pectoris   2. Hyperlipidemia LDL goal <70   3. Essential hypertension   4. ESRD on dialysis ([place])   5. Medication management    PLAN:    In order of problems listed above:  CAD, hx of asystole during Lexiscan Stable with no anginal symptoms. No indication for ischemic evaluation. Continue current medication regimen. Should be on Aspirin 81 mg daily. Will route this note to PCP to get clarification. Heart healthy diet and regular cardiovascular exercise encouraged.   HLD She is due for labs. Will obtain FLP and CMET. Continue atorvastatin. Heart healthy diet and regular cardiovascular exercise encouraged.   HTN BP on arrival, 148/80, repeat BP 140/75. Discussed to monitor BP at home at least 2 hours after medications and sitting for 5-10 minutes. Will increase Metoprolol succinate to 75 mg daily. Continue hydralazine, amlodipine, and clonidine. Heart healthy diet and regular cardiovascular  exercise encouraged.   ESRD on HD (M/W/F) Continue to follow up with Nephrology and dialysis treatments. Will obtain due labs.   5. Disposition: Follow-up with Dr. Domenic Polite in 1 year or sooner if anything changes.    Medication Adjustments/Labs and Tests Ordered: Current medicines are reviewed at length with the  patient today.  Concerns regarding medicines are outlined above.  Orders Placed This Encounter  Procedures   Lipid panel   CBC   Comprehensive metabolic panel   EKG [id number]   Meds ordered this encounter  Medications   metoprolol succinate (TOPROL-XL) 50 MG 24 hr tablet    Sig: Take 1.5 tablets (75 mg total) by mouth daily.    Dispense:  45 tablet    Refill:  6    Dose increased 07/13/2022    Patient Instructions  Medication Instructions:  Increase Toprol XL to 1 1/2 tabs daily  Continue all other medications.     Labwork: FLP, CBC, CMET - orders given today  Reminder:  Nothing to eat or drink after 12 midnight prior to labs. Office will contact with results via phone, letter or mychart.     Testing/Procedures: none  Follow-Up: Your physician wants you to follow up in:  1 year.  You should receive a call from the office when due.  If you don't receive this call, please call our office to schedule the follow up appointment    Any Other Special Instructions Will Be Listed Below (If Applicable).   If you need a refill on your cardiac medications before your next appointment, please call your pharmacy.    Signed, Finis Bud, NP  07/15/2022 1:17 PM    [facility]

## 2022-07-13 NOTE — Patient Instructions (Addendum)
Medication Instructions:  Increase Toprol XL to 1 1/2 tabs daily  Continue all other medications.     Labwork: FLP, CBC, CMET - orders given today  Reminder:  Nothing to eat or drink after 12 midnight prior to labs. Office will contact with results via phone, letter or mychart.     Testing/Procedures: none  Follow-Up: Your physician wants you to follow up in:  1 year.  You should receive a call from the office when due.  If you don't receive this call, please call our office to schedule the follow up appointment    Any Other Special Instructions Will Be Listed Below (If Applicable).   If you need a refill on your cardiac medications before your next appointment, please call your pharmacy.

## 2022-07-15 ENCOUNTER — Encounter: Payer: Self-pay | Admitting: Nurse Practitioner

## 2022-07-19 NOTE — Progress Notes (Signed)
Last office note routed to PCP, per Finis Bud.

## 2022-07-24 ENCOUNTER — Encounter (HOSPITAL_COMMUNITY)
Admission: RE | Admit: 2022-07-24 | Discharge: 2022-07-24 | Disposition: A | Discharge status: Home / Self Care | Payer: 59 | Source: Ambulatory Visit | Attending: Internal Medicine | Admitting: Internal Medicine

## 2022-07-24 DIAGNOSIS — E0800 Diabetes mellitus due to underlying condition with hyperosmolarity without nonketotic hyperglycemic-hyperosmolar coma (NKHHC): Secondary | ICD-10-CM

## 2022-07-25 ENCOUNTER — Telehealth: Payer: Self-pay | Admitting: *Deleted

## 2022-07-25 NOTE — Telephone Encounter (Signed)
Pre -service center called and says no PA on file for pt's procedure.   Authorization done via [facility] #: [id number] DOS: 07/27/22-10/25/22  Called and left vm at pre-service center that authorization was completed and has been approved.

## 2022-07-27 ENCOUNTER — Ambulatory Visit (HOSPITAL_BASED_OUTPATIENT_CLINIC_OR_DEPARTMENT_OTHER): Payer: 59 | Admitting: Anesthesiology

## 2022-07-27 ENCOUNTER — Ambulatory Visit (HOSPITAL_COMMUNITY)
Admission: RE | Admit: 2022-07-27 | Discharge: 2022-07-27 | Disposition: A | Discharge status: Home / Self Care | Payer: 59 | Source: Ambulatory Visit | Attending: Internal Medicine | Admitting: Internal Medicine

## 2022-07-27 ENCOUNTER — Other Ambulatory Visit: Payer: Self-pay

## 2022-07-27 ENCOUNTER — Encounter (HOSPITAL_COMMUNITY): Payer: Self-pay

## 2022-07-27 ENCOUNTER — Ambulatory Visit (HOSPITAL_COMMUNITY): Payer: 59 | Admitting: Anesthesiology

## 2022-07-27 ENCOUNTER — Telehealth: Payer: Self-pay | Admitting: *Deleted

## 2022-07-27 ENCOUNTER — Encounter (HOSPITAL_COMMUNITY)
Admission: RE | Disposition: A | Discharge status: Home / Self Care | Payer: Self-pay | Source: Ambulatory Visit | Attending: Internal Medicine

## 2022-07-27 DIAGNOSIS — I130 Hypertensive heart and chronic kidney disease with heart failure and stage 1 through stage 4 chronic kidney disease, or unspecified chronic kidney disease: Secondary | ICD-10-CM | POA: Diagnosis not present

## 2022-07-27 DIAGNOSIS — N184 Chronic kidney disease, stage 4 (severe): Secondary | ICD-10-CM | POA: Diagnosis not present

## 2022-07-27 DIAGNOSIS — Z8673 Personal history of transient ischemic attack (TIA), and cerebral infarction without residual deficits: Secondary | ICD-10-CM | POA: Insufficient documentation

## 2022-07-27 DIAGNOSIS — E1122 Type 2 diabetes mellitus with diabetic chronic kidney disease: Secondary | ICD-10-CM | POA: Diagnosis not present

## 2022-07-27 DIAGNOSIS — N186 End stage renal disease: Secondary | ICD-10-CM

## 2022-07-27 DIAGNOSIS — Z794 Long term (current) use of insulin: Secondary | ICD-10-CM | POA: Diagnosis not present

## 2022-07-27 DIAGNOSIS — Z1211 Encounter for screening for malignant neoplasm of colon: Secondary | ICD-10-CM

## 2022-07-27 DIAGNOSIS — Z87891 Personal history of nicotine dependence: Secondary | ICD-10-CM

## 2022-07-27 DIAGNOSIS — J449 Chronic obstructive pulmonary disease, unspecified: Secondary | ICD-10-CM | POA: Insufficient documentation

## 2022-07-27 DIAGNOSIS — Z992 Dependence on renal dialysis: Secondary | ICD-10-CM | POA: Insufficient documentation

## 2022-07-27 DIAGNOSIS — Z7985 Long-term (current) use of injectable non-insulin antidiabetic drugs: Secondary | ICD-10-CM | POA: Insufficient documentation

## 2022-07-27 DIAGNOSIS — Z79899 Other long term (current) drug therapy: Secondary | ICD-10-CM | POA: Insufficient documentation

## 2022-07-27 DIAGNOSIS — I2510 Atherosclerotic heart disease of native coronary artery without angina pectoris: Secondary | ICD-10-CM

## 2022-07-27 DIAGNOSIS — E0800 Diabetes mellitus due to underlying condition with hyperosmolarity without nonketotic hyperglycemic-hyperosmolar coma (NKHHC): Secondary | ICD-10-CM

## 2022-07-27 DIAGNOSIS — K219 Gastro-esophageal reflux disease without esophagitis: Secondary | ICD-10-CM | POA: Diagnosis not present

## 2022-07-27 DIAGNOSIS — I5031 Acute diastolic (congestive) heart failure: Secondary | ICD-10-CM

## 2022-07-27 DIAGNOSIS — I132 Hypertensive heart and chronic kidney disease with heart failure and with stage 5 chronic kidney disease, or end stage renal disease: Secondary | ICD-10-CM

## 2022-07-27 DIAGNOSIS — F419 Anxiety disorder, unspecified: Secondary | ICD-10-CM | POA: Insufficient documentation

## 2022-07-27 DIAGNOSIS — I509 Heart failure, unspecified: Secondary | ICD-10-CM | POA: Insufficient documentation

## 2022-07-27 DIAGNOSIS — F319 Bipolar disorder, unspecified: Secondary | ICD-10-CM | POA: Diagnosis not present

## 2022-07-27 HISTORY — PX: COLONOSCOPY WITH PROPOFOL: SHX5780

## 2022-07-27 LAB — GLUCOSE, CAPILLARY
Glucose-Capillary: 176 mg/dL — ABNORMAL HIGH (ref 70–99)
Glucose-Capillary: 159 mg/dL — ABNORMAL HIGH (ref 70–99)

## 2022-07-27 SURGERY — COLONOSCOPY WITH PROPOFOL
Anesthesia: General

## 2022-07-27 MED ORDER — PROPOFOL 10 MG/ML IV BOLUS
INTRAVENOUS | Status: DC | PRN
  Administered 2022-07-27: 120 mg via INTRAVENOUS

## 2022-07-27 MED ORDER — LACTATED RINGERS IV SOLN: INTRAVENOUS | Status: DC

## 2022-07-27 MED ORDER — LINACLOTIDE 290 MCG PO CAPS: 290.0000 ug | ORAL_CAPSULE | Freq: Every day | ORAL | 3 refills | Status: DC

## 2022-07-27 MED ORDER — PROPOFOL 500 MG/50ML IV EMUL
INTRAVENOUS | Status: DC | PRN
  Administered 2022-07-27: 125 ug/kg/min via INTRAVENOUS

## 2022-07-27 NOTE — Transfer of Care (Signed)
Immediate Anesthesia Transfer of Care Note  Patient: EMALIA HERTEL  Procedure(s) Performed: COLONOSCOPY WITH PROPOFOL  Patient Location: Short Stay  Anesthesia Type:General  Level of Consciousness: sedated  Airway & Oxygen Therapy: Patient Spontanous Breathing  Post-op Assessment: Report given to RN and Post -op Vital signs reviewed and stable  Post vital signs: Reviewed and stable  Last Vitals:  Vitals Value Taken Time  BP 125/66 07/27/22 1040  Temp 36.8 C 07/27/22 1040  Pulse 77 07/27/22 1040  Resp 15 07/27/22 1040  SpO2 99 % 07/27/22 1040    Last Pain:  Vitals:   07/27/22 1040  TempSrc: Oral  PainSc:       Patients Stated Pain Goal: 8 ([id number])  Complications: No notable events documented.

## 2022-07-27 NOTE — Anesthesia Procedure Notes (Signed)
Date/Time: 07/27/2022 10:21 AM  Performed by: Vista Deck, CRNAPre-anesthesia Checklist: Patient identified, Emergency Drugs available, Suction available, Timeout performed and Patient being monitored Patient Re-evaluated:Patient Re-evaluated prior to induction Oxygen Delivery Method: Nasal Cannula

## 2022-07-27 NOTE — Anesthesia Preprocedure Evaluation (Signed)
Anesthesia Evaluation  Patient identified by MRN, date of birth, ID band Patient awake    Reviewed: Allergy & Precautions, H&P , NPO status , Patient's Chart, lab work & pertinent test results, reviewed documented beta blocker date and time   Airway Mallampati: III  TM Distance: >3 FB Neck ROM: Full    Dental  (+) Dental Advisory Given   Pulmonary shortness of breath and with exertion, COPD, former smoker   Pulmonary exam normal breath sounds clear to auscultation       Cardiovascular Exercise Tolerance: Good hypertension, Pt. on home beta blockers and Pt. on medications + CAD and +CHF  Normal cardiovascular exam Rhythm:Regular Rate:Normal     Neuro/Psych  PSYCHIATRIC DISORDERS Anxiety Depression Bipolar Disorder   TIA Neuromuscular disease CVA    GI/Hepatic Neg liver ROS,GERD  Medicated and Controlled,,  Endo/Other  diabetes, Well Controlled, Type 2, Insulin Dependent    Renal/GU ESRF and DialysisRenal disease  negative genitourinary   Musculoskeletal negative musculoskeletal ROS (+)    Abdominal   Peds negative pediatric ROS (+)  Hematology  (+) Blood dyscrasia, anemia   Anesthesia Other Findings   Reproductive/Obstetrics negative OB ROS                             Anesthesia Physical Anesthesia Plan  ASA: 3  Anesthesia Plan: General   Post-op Pain Management: Minimal or no pain anticipated   Induction: Intravenous  PONV Risk Score and Plan: Propofol infusion  Airway Management Planned: Nasal Cannula and Natural Airway  Additional Equipment:   Intra-op Plan:   Post-operative Plan:   Informed Consent: I have reviewed the patients History and Physical, chart, labs and discussed the procedure including the risks, benefits and alternatives for the proposed anesthesia with the patient or authorized representative who has indicated his/her understanding and acceptance.      Dental advisory given  Plan Discussed with: CRNA and Surgeon  Anesthesia Plan Comments:        Anesthesia Quick Evaluation

## 2022-07-27 NOTE — Anesthesia Postprocedure Evaluation (Signed)
Anesthesia Post Note  Patient: Samantha Pierce  Procedure(s) Performed: COLONOSCOPY WITH PROPOFOL  Patient location during evaluation: Phase II Anesthesia Type: General Level of consciousness: awake and alert and oriented Pain management: pain level controlled Vital Signs Assessment: post-procedure vital signs reviewed and stable Respiratory status: spontaneous breathing, nonlabored ventilation and respiratory function stable Cardiovascular status: blood pressure returned to baseline and stable Postop Assessment: no apparent nausea or vomiting Anesthetic complications: no  No notable events documented.   Last Vitals:  Vitals:   07/27/22 0932 07/27/22 1040  BP: (!) 174/82 125/66  Pulse: 83 77  Resp: 18 15  Temp: 36.6 C 36.8 C  SpO2: 100% 99%    Last Pain:  Vitals:   07/27/22 1040  TempSrc: Oral  PainSc:                  Bolton Canupp C Adelisa Satterwhite

## 2022-07-27 NOTE — Op Note (Signed)
[facility] Patient Name: Samantha Pierce Procedure Date: 07/27/2022 10:14 AM MRN: [id number] Date of Birth: 09-22-1970 Attending MD: Elon Alas. Abbey Chatters , [place], [id number] CSN: [id number] Age: 52 Admit Type: Outpatient Procedure:                Colonoscopy Indications:              Screening for colorectal malignant neoplasm Providers:                Elon Alas. Abbey Chatters, DO, Janeece Riggers, RN, Lambert Mody, Aram Candela Referring MD:              Medicines:                See the Anesthesia note for documentation of the                            administered medications Complications:            No immediate complications. Estimated Blood Loss:     Estimated blood loss: none. Procedure:                Pre-Anesthesia Assessment:                           - The anesthesia plan was to use monitored                            anesthesia care (MAC).                           After obtaining informed consent, the colonoscope                            was passed under direct vision. Throughout the                            procedure, the patient's blood pressure, pulse, and                            oxygen saturations were monitored continuously. The                            [id number]-HQ190L [id number]) scope was introduced through                            the anus and advanced to the the cecum, identified                            by the ileocecal valve. The colonoscopy was                            performed without difficulty. The patient tolerated                            the procedure well. The quality  of the bowel                            preparation was evaluated using the BBPS [facility]                            Bowel Preparation Scale) with scores of: Right                            Colon = 1 (portion of mucosa seen, but other areas                            not well seen due to staining, residual stool                            and/or opaque  liquid), Transverse Colon = 1                            (portion of mucosa seen, but other areas not well                            seen due to staining, residual stool and/or opaque                            liquid) and Left Colon = 1 (portion of mucosa seen,                            but other areas not well seen due to staining,                            residual stool and/or opaque liquid). The total                            BBPS score equals 3. The quality of the bowel                            preparation was inadequate. Scope In: 10:27:23 AM Scope Out: 10:35:58 AM Scope Withdrawal Time: 0 hours 2 minutes 14 seconds  Total Procedure Duration: 0 hours 8 minutes 35 seconds  Findings:      The perianal and digital rectal examinations were normal.      Extensive amounts of semi-liquid semi-solid stool was found in the       entire colon, precluding visualization. Impression:               - Preparation of the colon was inadequate.                           - Stool in the entire examined colon.                           - No specimens collected. Moderate Sedation:      Per Anesthesia Care Recommendation:           - Patient has a contact  number available for                            emergencies. The signs and symptoms of potential                            delayed complications were discussed with the                            patient. Return to normal activities tomorrow.                            Written discharge instructions were provided to the                            patient.                           - Resume previous diet.                           - Continue present medications.                           - Repeat colonoscopy in 6 months because the bowel                            preparation was poor.                           - Return to GI clinic in 3 months.                           - Start Linzess (linaclotide) 290 mcg PO daily. Procedure Code(s):         --- Professional ---                           [id number], Colorectal cancer screening; colonoscopy on                            individual not meeting criteria for high risk Diagnosis Code(s):        --- Professional ---                           Z12.11, Encounter for screening for malignant                            neoplasm of colon CPT copyright 2022 American Medical Association. All rights reserved. The codes documented in this report are preliminary and upon coder review may  be revised to meet current compliance requirements. Elon Alas. Abbey Chatters, DO [place] Abbey Chatters, DO 07/27/2022 10:40:25 AM This report has been signed electronically. Number of Addenda: 0

## 2022-07-27 NOTE — H&P (Signed)
Primary Care Physician:  Willene Hatchet, NP Primary Gastroenterologist:  Dr. Abbey Chatters  Pre-Procedure History & Physical: HPI:  Samantha Pierce is a 52 y.o. female is here for first ever colonoscopy for colon cancer screening purposes.  Patient denies any family history of colorectal cancer.  No melena or hematochezia.  No abdominal pain or unintentional weight loss.  No change in bowel habits.  Overall feels well from a GI standpoint.  Past Medical History:  Diagnosis Date   Anemia    Anxiety    Asystole ([place])    a. 05/2015: pt developed bradycardia->asystole during [place] nuclear stress test, s/p brief code. Cath with only 30% prox LCx. Her asystole during Lexiscan infusion was felt to represent an excessive pharmacologic response to the agent and likely superimposed vasovagal response.   Bipolar disorder ([place])    CKD (chronic kidney disease) stage 4, GFR 15-29 ml/min (HCC)    M_W_F dialysis   COPD (chronic obstructive pulmonary disease) (HCC)    Depression    Essential hypertension    GERD (gastroesophageal reflux disease)    History of blood transfusion 2011   History of seizure 2017   only one seizure - none since   History of stroke 09/2014   Acute lacunar infarct of the left thalamus   Leg cramps    Mild CAD    a. LHC 05/2015: 30% prox Cx, otherwise widely patent.   Morbid obesity ([place])    Neuromuscular disorder ([place])    neuropathy in feet   Neuropathy    feet   PTSD (post-traumatic stress disorder)    Stroke ([place])    x 2, right side weakness   Type 2 diabetes mellitus ([place])     Past Surgical History:  Procedure Laterality Date   AV FISTULA PLACEMENT Right 01/08/2017   Procedure: RIGHT ARM BRACHIOCEPHALIC ARTERIOVENOUS (AV) FISTULA CREATION;  Surgeon: Waynetta Sandy, MD;  Location: [facility];  Service: Vascular;  Laterality: Right;   AV FISTULA PLACEMENT Left 11/20/2018   Procedure: Creation of LEFT ARM ARTERIOVENOUS (AV) FISTULA;  Surgeon: Waynetta Sandy, MD;  Location: [facility];  Service: Vascular;  Laterality: Left;   AV FISTULA PLACEMENT Right 02/24/2021   Procedure: RIGHT UPPER ARM BASILIC VEIN ARTERIOVENOUS (AV) FISTULA CREATION;  Surgeon: Cherre Robins, MD;  Location: [facility];  Service: Vascular;  Laterality: Right;   AV FISTULA PLACEMENT Right 05/05/2021   Procedure: INSERTION OF ARTERIOVENOUS (AV) GORE-TEX GRAFT ARM;  Surgeon: Cherre Robins, MD;  Location: [facility];  Service: Vascular;  Laterality: Right;   [place] Left 02/11/2019   Procedure: BASILIC VEIN TRANSPOSITION SECOND STAGE;  Surgeon: Waynetta Sandy, MD;  Location: [facility];  Service: Vascular;  Laterality: Left;   CARDIAC CATHETERIZATION N/A 06/01/2015   Procedure: Left Heart Cath and Coronary Angiography;  Surgeon: Belva Crome, MD; CFX 30%, no other dz, EF nl by echo   CESAREAN SECTION     x2   DILATION AND CURETTAGE OF UTERUS     EYE SURGERY     Right eye pars plano vitrectomy    HEMATOMA EVACUATION Left 03/03/2019   Procedure: EVACUATION HEMATOMA;  Surgeon: Elam Dutch, MD;  Location: [place];  Service: Vascular;  Laterality: Left;   INSERTION OF DIALYSIS CATHETER Right 01/04/2021   Procedure: INSERTION OF RIGHT INTERNAL JUGULAR TUNNELED DIALYSIS CATHETER;  Surgeon: Elam Dutch, MD;  Location: [facility];  Service: Vascular;  Laterality: Right;   LASIK  LIGATION OF ARTERIOVENOUS  FISTULA Left 01/04/2021   Procedure: LIGATION OF LEFT ARM ARTERIOVENOUS  FISTULA;  Surgeon: Elam Dutch, MD;  Location: [facility];  Service: Vascular;  Laterality: Left;   PARS PLANA VITRECTOMY  04/20/2011   Procedure: PARS PLANA VITRECTOMY WITH 25 GAUGE;  Surgeon: Hayden Pedro, MD;  Location: [facility];  Service: Ophthalmology;  Laterality: Left;  membrane peel, gas fluid exchange, endolaser, repair of complex retinal detachment   PATCH ANGIOPLASTY Left 10/05/2020   Procedure: PATCH ANGIOPLASTY USING Rueben Bash BIOLOGIC PATCH;  Surgeon: Elam Dutch, MD;   Location: [facility];  Service: Vascular;  Laterality: Left;   PERIPHERAL VASCULAR BALLOON ANGIOPLASTY Left 09/02/2020   Procedure: PERIPHERAL VASCULAR BALLOON ANGIOPLASTY;  Surgeon: Marty Heck, MD;  Location: [facility];  Service: Cardiovascular;  Laterality: Left;  arm fistula   REMOVAL OF A DIALYSIS CATHETER N/A 06/21/2021   Procedure: MINOR REMOVAL OF TUNNELED DIALYSIS CATHETER;  Surgeon: Rosetta Posner, MD;  Location: [facility];  Service: Vascular;  Laterality: N/A;   REVISON OF ARTERIOVENOUS FISTULA Left 10/05/2020   Procedure: REVISION OF LEFT ARM ARTERIOVENOUS FISTULA;  Surgeon: Elam Dutch, MD;  Location: [facility];  Service: Vascular;  Laterality: Left;   TUBAL LIGATION      Prior to Admission medications   Medication Sig Start Date End Date Taking? Authorizing Provider  amLODipine (NORVASC) 10 MG tablet Take 1 tablet (10 mg total) by mouth daily. 01/21/19  Yes Tat, Shanon Brow, MD  atorvastatin (LIPITOR) 80 MG tablet Take 1 tablet (80 mg total) by mouth daily at 6 PM. Patient taking differently: Take 80 mg by mouth at bedtime. 08/05/15  Yes Satira Sark, MD  cloNIDine (CATAPRES) 0.2 MG tablet Take 0.2 mg by mouth 2 (two) times daily.   Yes [provider]  FLUoxetine (PROZAC) 10 MG tablet Take 10 mg by mouth daily. Take with 20 mg for a total of 30 mg daily 12/15/20  Yes [provider]  FLUoxetine (PROZAC) 20 MG capsule Take 20 mg by mouth daily. Take with 10 mg for a total of 30 mg daily 12/15/20  Yes [provider]  furosemide (LASIX) 80 MG tablet Take 80 mg by mouth 2 (two) times daily. 09/25/19  Yes [provider]  gabapentin (NEURONTIN) 300 MG capsule TAKE (1) CAPSULE BY MOUTH DAILY. Patient taking differently: Take 300 mg by mouth at bedtime. 07/04/22  Yes Waynetta Sandy, MD  hydrALAZINE (APRESOLINE) 50 MG tablet Take 50 mg by mouth 2 (two) times daily. 12/16/20  Yes [provider]  hydrOXYzine (ATARAX) 25 MG tablet  Take 25 mg by mouth 3 (three) times daily as needed for itching. 07/12/21  Yes [provider]  insulin detemir (LEVEMIR) 100 UNIT/ML FlexPen Inject 50 Units into the skin at bedtime. 03/07/22  Yes Reardon, Whitney J, NP  NOVOLOG FLEXPEN 100 UNIT/ML FlexPen INJECT 10-16 UNITS INTO THE SKIN 3 TIMES DAILY WUTH MEALS. 01/04/22  Yes Brita Romp, NP  omeprazole (PRILOSEC) 20 MG capsule Take 20 mg by mouth daily before breakfast.  02/07/18  Yes [provider]  Semaglutide, 1 MG/DOSE, 4 MG/3ML SOPN Inject 1 mg as directed once a week. Patient taking differently: Inject 1 mg as directed every Monday. 03/07/22  Yes Brita Romp, NP  traZODone (DESYREL) 100 MG tablet Take 100 mg by mouth at bedtime.   Yes [provider]  Continuous Blood Gluc Receiver (DEXCOM G6 RECEIVER) [place]  TIMES DAILY AS DIRECTED. 10/08/20   Brita Romp, NP  Lancets (ONETOUCH DELICA PLUS [id number]) MISC Apply topically. 12/15/20   [provider]  metoprolol succinate (TOPROL-XL) 50 MG 24 hr tablet Take 1.5 tablets (75 mg total) by mouth daily. Patient not taking: Reported on 07/25/2022 07/13/22   Finis Bud, NP  [facility] VERIO test strip SMARTSIG:Via Meter 12/15/20   [provider]    Allergies as of 06/22/2022 - Review Complete 05/04/2022  Allergen Reaction Noted   Contrast media [iodinated contrast media] Anaphylaxis 01/04/2017   Penicillins Rash and Other (See Comments) 04/18/2011    Family History  Problem Relation Age of Onset   Diabetes Mother    Kidney failure Mother    Diabetes Father    Amblyopia Neg Hx    Blindness Neg Hx    Cataracts Neg Hx    Glaucoma Neg Hx    Macular degeneration Neg Hx    Retinal detachment Neg Hx    Strabismus Neg Hx    Retinitis pigmentosa Neg Hx     Social History   Socioeconomic History   Marital status: Single    Spouse name: Not on file   Number of children: Not on file   Years of education:  Not on file   Highest education level: Not on file  Occupational History   Not on file  Tobacco Use   Smoking status: Former    Packs/day: 0.03    Years: 30.00    Total pack years: 0.90    Types: Cigarettes    Start date: 06/05/1981    Quit date: 06/2022    Years since quitting: 0.1   Smokeless tobacco: Never   Tobacco comments:    Quit in 12/2018 but now smokes off and on per patient 05/04/21.  Vaping Use   Vaping Use: Never used  Substance and Sexual Activity   Alcohol use: Yes    Comment: occ wine   Drug use: No   Sexual activity: Yes    Birth control/protection: Surgical    Comment: Tubal Ligation  Other Topics Concern   Not on file  Social History Narrative   Not on file   Social Determinants of Health   Financial Resource Strain: Not on file  Food Insecurity: Not on file  Transportation Needs: Not on file  Physical Activity: Not on file  Stress: Not on file  Social Connections: Not on file  Intimate Partner Violence: Not on file    Review of Systems: See HPI, otherwise negative ROS  Physical Exam: Vital signs in last 24 hours: Temp:  [97.9 F (36.6 C)] 97.9 F (36.6 C) (02/22 0932) Pulse Rate:  [83] 83 (02/22 0932) Resp:  [18] 18 (02/22 0932) BP: (174)/(82) 174/82 (02/22 0932) SpO2:  [100 %] 100 % (02/22 0932) Weight:  [id number] kg] 103 kg (02/22 0932)   General:   Alert,  Well-developed, well-nourished, pleasant and cooperative in NAD Head:  Normocephalic and atraumatic. Eyes:  Sclera clear, no icterus.   Conjunctiva pink. Ears:  Normal auditory acuity. Nose:  No deformity, discharge,  or lesions. Msk:  Symmetrical without gross deformities. Normal posture. Extremities:  Without clubbing or edema. Neurologic:  Alert and  oriented x4;  grossly normal neurologically. Skin:  Intact without significant lesions or rashes. Psych:  Alert and cooperative. Normal mood and affect.  Impression/Plan: Samantha Pierce is here for a colonoscopy to be performed for  colon cancer screening purposes.  The risks of the procedure including infection,  bleed, or perforation as well as benefits, limitations, alternatives and imponderables have been reviewed with the patient. Questions have been answered. All parties agreeable.

## 2022-07-27 NOTE — Telephone Encounter (Signed)
Pt called and left vm that her ride showed up late and that she was on her way to her procedure.  Called and informed Threasa Beards of pt's message and she said she would let the doctor know.

## 2022-07-27 NOTE — Discharge Instructions (Addendum)
Colonoscopy Discharge Instructions  Read the instructions outlined below and refer to this sheet in the next few weeks. These discharge instructions provide you with general information on caring for yourself after you leave the hospital. Your doctor may also give you specific instructions. While your treatment has been planned according to the most current medical practices available, unavoidable complications occasionally occur.   ACTIVITY You may resume your regular activity, but move at a slower pace for the next 24 hours.  Take frequent rest periods for the next 24 hours.  Walking will help get rid of the air and reduce the bloated feeling in your belly (abdomen).  No driving for 24 hours (because of the medicine (anesthesia) used during the test).   Do not sign any important legal documents or operate any machinery for 24 hours (because of the anesthesia used during the test).  NUTRITION Drink plenty of fluids.  You may resume your normal diet as instructed by your doctor.  Begin with a light meal and progress to your normal diet. Heavy or fried foods are harder to digest and may make you feel sick to your stomach (nauseated).  Avoid alcoholic beverages for 24 hours or as instructed.  MEDICATIONS You may resume your normal medications unless your doctor tells you otherwise.  WHAT YOU CAN EXPECT TODAY Some feelings of bloating in the abdomen.  Passage of more gas than usual.  Spotting of blood in your stool or on the toilet paper.  IF YOU HAD POLYPS REMOVED DURING THE COLONOSCOPY: No aspirin products for 7 days or as instructed.  No alcohol for 7 days or as instructed.  Eat a soft diet for the next 24 hours.  FINDING OUT THE RESULTS OF YOUR TEST Not all test results are available during your visit. If your test results are not back during the visit, make an appointment with your caregiver to find out the results. Do not assume everything is normal if you have not heard from your  caregiver or the medical facility. It is important for you to follow up on all of your test results.  SEEK IMMEDIATE MEDICAL ATTENTION IF: You have more than a spotting of blood in your stool.  Your belly is swollen (abdominal distention).  You are nauseated or vomiting.  You have a temperature over 101.  You have abdominal pain or discomfort that is severe or gets worse throughout the day.   Unfortunately, your colon was not adequately prepped today for colonoscopy.  Would recommend repeat colonoscopy in 6 months with a extended colon prep.  In the meantime, I am going to start you on a new medication for constipation called Linzess 290 mcg daily.  Linzess works best when taken once a day every day, on an empty stomach, at least 30 minutes before your first meal of the day.  When Linzess is taken daily as directed:  *Constipation relief is typically felt in about a week *IBS-C patients may begin to experience relief from belly pain and overall abdominal symptoms (pain, discomfort, and bloating) in about 1 week,   with symptoms typically improving over 12 weeks.  Diarrhea may occur in the first 2 weeks -keep taking it.  The diarrhea should go away and you should start having normal, complete, full bowel movements. It may be helpful to start treatment when you can be near the comfort of your own bathroom, such as a weekend.   Follow up in GI office in 3 months OFFICE TO [place]  I hope you have a great rest of your week!  Elon Alas. Abbey Chatters, D.O. Gastroenterology and Hepatology [facility]

## 2022-08-01 ENCOUNTER — Other Ambulatory Visit: Payer: Self-pay | Admitting: Vascular Surgery

## 2022-08-03 ENCOUNTER — Encounter (HOSPITAL_COMMUNITY): Payer: Self-pay | Admitting: Internal Medicine

## 2022-08-07 ENCOUNTER — Encounter: Payer: Self-pay | Admitting: Internal Medicine

## 2022-08-10 ENCOUNTER — Telehealth: Payer: Self-pay

## 2022-08-10 ENCOUNTER — Ambulatory Visit: Payer: 59 | Admitting: Nurse Practitioner

## 2022-08-10 NOTE — Telephone Encounter (Signed)
Refill request for Gabapentin received via fax from [facility].  Reviewed pt's chart. Pt has been on med since 2012. Called pharmacy and deferred to pt's PCP for refill. Confirmed understanding.

## 2022-08-16 ENCOUNTER — Other Ambulatory Visit: Payer: Self-pay | Admitting: Nurse Practitioner

## 2022-08-17 NOTE — Telephone Encounter (Signed)
Do limited refill to hold her over until she sees me next month.  Be sure she knows I will not refill further until she is seen.

## 2022-08-22 ENCOUNTER — Other Ambulatory Visit: Payer: Self-pay

## 2022-08-22 MED ORDER — INSULIN LISPRO (1 UNIT DIAL) 100 UNIT/ML (KWIKPEN): 10.0000 [IU] | PEN_INJECTOR | Freq: Three times a day (TID) | SUBCUTANEOUS | 0 refills | Status: DC

## 2022-09-05 LAB — LIPID PANEL
Chol/HDL Ratio: 2.8 ratio (ref 0.0–4.4)
Cholesterol, Total: 153 mg/dL (ref 100–199)
HDL: 55 mg/dL (ref >39)
LDL Chol Calc (NIH): 76 mg/dL (ref 0–99)
Triglycerides: 122 mg/dL (ref 0–149)
VLDL Cholesterol Cal: 22 mg/dL (ref 5–40)

## 2022-09-05 LAB — T4, FREE: Free T4: 1.31 ng/dL (ref 0.82–1.77)

## 2022-09-05 LAB — TSH: TSH: 0.524 u[IU]/mL (ref 0.450–4.500)

## 2022-09-07 ENCOUNTER — Encounter: Payer: Self-pay | Admitting: Nurse Practitioner

## 2022-09-07 ENCOUNTER — Ambulatory Visit (INDEPENDENT_AMBULATORY_CARE_PROVIDER_SITE_OTHER): Payer: 59 | Admitting: Nurse Practitioner

## 2022-09-07 VITALS — BP 113/71 | HR 88 | Ht 64.0 in | Wt 228.0 lb

## 2022-09-07 DIAGNOSIS — E782 Mixed hyperlipidemia: Secondary | ICD-10-CM

## 2022-09-07 DIAGNOSIS — I10 Essential (primary) hypertension: Secondary | ICD-10-CM | POA: Diagnosis not present

## 2022-09-07 DIAGNOSIS — E1165 Type 2 diabetes mellitus with hyperglycemia: Secondary | ICD-10-CM

## 2022-09-07 DIAGNOSIS — Z794 Long term (current) use of insulin: Secondary | ICD-10-CM | POA: Diagnosis not present

## 2022-09-07 LAB — POCT GLYCOSYLATED HEMOGLOBIN (HGB A1C): Hemoglobin A1C: 7.5 % — AB (ref 4.0–5.6)

## 2022-09-07 MED ORDER — INSULIN LISPRO (1 UNIT DIAL) 100 UNIT/ML (KWIKPEN): 6.0000 [IU] | PEN_INJECTOR | Freq: Three times a day (TID) | SUBCUTANEOUS | 3 refills | Status: DC

## 2022-09-07 MED ORDER — SEMAGLUTIDE (2 MG/DOSE) 8 MG/3ML ~~LOC~~ SOPN: 2.0000 mg | PEN_INJECTOR | SUBCUTANEOUS | 3 refills | Status: DC

## 2022-09-07 MED ORDER — INSULIN DETEMIR 100 UNIT/ML FLEXPEN: 40.0000 [IU] | PEN_INJECTOR | Freq: Every day | SUBCUTANEOUS | 3 refills | Status: DC

## 2022-09-07 NOTE — Progress Notes (Signed)
Endocrinology Follow Up Visit      09/07/2022, 8:52 AM   Subjective:    Patient ID: Samantha Pierce, female    DOB: 24-May-1971.  Samantha Pierce is being seen in follow up after being seen in consultation for management of currently uncontrolled symptomatic diabetes requested by  Willene Hatchet, NP.   Past Medical History:  Diagnosis Date   Anemia    Anxiety    Asystole    a. 05/2015: pt developed bradycardia->asystole during [facility] nuclear stress test, s/p brief code. Cath with only 30% prox LCx. Her asystole during Lexiscan infusion was felt to represent an excessive pharmacologic response to the agent and likely superimposed vasovagal response.   Bipolar disorder    CKD (chronic kidney disease) stage 4, GFR 15-29 ml/min    M_W_F dialysis   COPD (chronic obstructive pulmonary disease)    Depression    Essential hypertension    GERD (gastroesophageal reflux disease)    History of blood transfusion 2011   History of seizure 2017   only one seizure - none since   History of stroke 09/2014   Acute lacunar infarct of the left thalamus   Leg cramps    Mild CAD    a. LHC 05/2015: 30% prox Cx, otherwise widely patent.   Morbid obesity    Neuromuscular disorder    neuropathy in feet   Neuropathy    feet   PTSD (post-traumatic stress disorder)    Stroke    x 2, right side weakness   Type 2 diabetes mellitus     Past Surgical History:  Procedure Laterality Date   AV FISTULA PLACEMENT Right 01/08/2017   Procedure: RIGHT ARM BRACHIOCEPHALIC ARTERIOVENOUS (AV) FISTULA CREATION;  Surgeon: Waynetta Sandy, MD;  Location: [place];  Service: Vascular;  Laterality: Right;   AV FISTULA PLACEMENT Left 11/20/2018   Procedure: Creation of LEFT ARM ARTERIOVENOUS (AV) FISTULA;  Surgeon: Waynetta Sandy, MD;  Location: [place];  Service: Vascular;  Laterality: Left;   AV FISTULA PLACEMENT Right  02/24/2021   Procedure: RIGHT UPPER ARM BASILIC VEIN ARTERIOVENOUS (AV) FISTULA CREATION;  Surgeon: Cherre Robins, MD;  Location: [place];  Service: Vascular;  Laterality: Right;   AV FISTULA PLACEMENT Right 05/05/2021   Procedure: INSERTION OF ARTERIOVENOUS (AV) GORE-TEX GRAFT ARM;  Surgeon: Cherre Robins, MD;  Location: [place];  Service: Vascular;  Laterality: Right;   [place] Left 02/11/2019   Procedure: BASILIC VEIN TRANSPOSITION SECOND STAGE;  Surgeon: Waynetta Sandy, MD;  Location: [place];  Service: Vascular;  Laterality: Left;   CARDIAC CATHETERIZATION N/A 06/01/2015   Procedure: Left Heart Cath and Coronary Angiography;  Surgeon: Belva Crome, MD; CFX 30%, no other dz, EF nl by echo   CESAREAN SECTION     x2   COLONOSCOPY WITH PROPOFOL N/A 07/27/2022   Procedure: COLONOSCOPY WITH PROPOFOL;  Surgeon: Eloise Harman, DO;  [facility];  Service: Endoscopy;  Laterality: N/A;  930am, asa 3, dialysis pt   DILATION AND CURETTAGE OF UTERUS     EYE SURGERY     Right eye pars plano vitrectomy    HEMATOMA EVACUATION  Left 03/03/2019   Procedure: EVACUATION HEMATOMA;  Surgeon: Elam Dutch, MD;  Location: [place];  Service: Vascular;  Laterality: Left;   INSERTION OF DIALYSIS CATHETER Right 01/04/2021   Procedure: INSERTION OF RIGHT INTERNAL JUGULAR TUNNELED DIALYSIS CATHETER;  Surgeon: Elam Dutch, MD;  Location: [facility];  Service: Vascular;  Laterality: Right;   LASIK     LIGATION OF ARTERIOVENOUS  FISTULA Left 01/04/2021   Procedure: LIGATION OF LEFT ARM ARTERIOVENOUS  FISTULA;  Surgeon: Elam Dutch, MD;  Location: [place];  Service: Vascular;  Laterality: Left;   PARS PLANA VITRECTOMY  04/20/2011   Procedure: PARS PLANA VITRECTOMY WITH 25 GAUGE;  Surgeon: Hayden Pedro, MD;  Location: [place];  Service: Ophthalmology;  Laterality: Left;  membrane peel, gas fluid exchange, endolaser, repair of complex retinal detachment   PATCH ANGIOPLASTY Left  10/05/2020   Procedure: PATCH ANGIOPLASTY USING Rueben Bash BIOLOGIC PATCH;  Surgeon: Elam Dutch, MD;  Location: [place];  Service: Vascular;  Laterality: Left;   PERIPHERAL VASCULAR BALLOON ANGIOPLASTY Left 09/02/2020   Procedure: PERIPHERAL VASCULAR BALLOON ANGIOPLASTY;  Surgeon: Marty Heck, MD;  Location: [place] [facility];  Service: Cardiovascular;  Laterality: Left;  arm fistula   REMOVAL OF A DIALYSIS CATHETER N/A 06/21/2021   Procedure: MINOR REMOVAL OF TUNNELED DIALYSIS CATHETER;  Surgeon: Rosetta Posner, MD;  [facility];  Service: Vascular;  Laterality: N/A;   REVISON OF ARTERIOVENOUS FISTULA Left 10/05/2020   Procedure: REVISION OF LEFT ARM ARTERIOVENOUS FISTULA;  Surgeon: Elam Dutch, MD;  Location: [facility] OR;  Service: Vascular;  Laterality: Left;   TUBAL LIGATION      Social History   Socioeconomic History   Marital status: Single    Spouse name: Not on file   Number of children: Not on file   Years of education: Not on file   Highest education level: Not on file  Occupational History   Not on file  Tobacco Use   Smoking status: Former    Packs/day: 0.03    Years: 30.00    Additional pack years: 0.00    Total pack years: 0.90    Types: Cigarettes    Start date: 06/05/1981    Quit date: 06/2022    Years since quitting: 0.2   Smokeless tobacco: Never   Tobacco comments:    Quit in 12/2018 but now smokes off and on per patient 05/04/21.  Vaping Use   Vaping Use: Never used  Substance and Sexual Activity   Alcohol use: Yes    Comment: occ wine   Drug use: No   Sexual activity: Yes    Birth control/protection: Surgical    Comment: Tubal Ligation  Other Topics Concern   Not on file  Social History Narrative   Not on file   Social Determinants of Health   Financial Resource Strain: Not on file  Food Insecurity: Not on file  Transportation Needs: Not on file  Physical Activity: Not on file  Stress: Not on file  Social Connections: Not on file     Family History  Problem Relation Age of Onset   Diabetes Mother    Kidney failure Mother    Diabetes Father    Amblyopia Neg Hx    Blindness Neg Hx    Cataracts Neg Hx    Glaucoma Neg Hx    Macular degeneration Neg Hx    Retinal detachment Neg Hx    Strabismus Neg Hx    Retinitis  pigmentosa Neg Hx     Outpatient Encounter Medications as of 09/07/2022  Medication Sig   amLODipine (NORVASC) 10 MG tablet Take 1 tablet (10 mg total) by mouth daily.   atorvastatin (LIPITOR) 80 MG tablet Take 1 tablet (80 mg total) by mouth daily at 6 PM. (Patient taking differently: Take 80 mg by mouth at bedtime.)   cloNIDine (CATAPRES) 0.2 MG tablet Take 0.2 mg by mouth 2 (two) times daily.   Continuous Blood Gluc Receiver (DEXCOM G6 RECEIVER) DEVI USE TO CHECK GLUCOSE FOUR TIMES DAILY AS DIRECTED.   FLUoxetine (PROZAC) 10 MG tablet Take 10 mg by mouth daily. Take with 20 mg for a total of 30 mg daily   FLUoxetine (PROZAC) 20 MG capsule Take 20 mg by mouth daily. Take with 10 mg for a total of 30 mg daily   furosemide (LASIX) 80 MG tablet Take 80 mg by mouth 2 (two) times daily.   gabapentin (NEURONTIN) 300 MG capsule TAKE (1) CAPSULE BY MOUTH DAILY. (Patient taking differently: Take 300 mg by mouth at bedtime.)   hydrALAZINE (APRESOLINE) 50 MG tablet Take 50 mg by mouth 2 (two) times daily.   hydrOXYzine (ATARAX) 25 MG tablet Take 25 mg by mouth 3 (three) times daily as needed for itching.   Lancets (ONETOUCH DELICA PLUS 123XX123) MISC Apply topically.   linaclotide (LINZESS) 290 MCG CAPS capsule Take 1 capsule (290 mcg total) by mouth daily before breakfast.   omeprazole (PRILOSEC) 20 MG capsule Take 20 mg by mouth daily before breakfast.    ONETOUCH VERIO test strip SMARTSIG:Via Meter   Semaglutide, 2 MG/DOSE, 8 MG/3ML SOPN Inject 2 mg as directed once a week.   traZODone (DESYREL) 100 MG tablet Take 100 mg by mouth at bedtime.   [DISCONTINUED] insulin detemir (LEVEMIR) 100 UNIT/ML FlexPen  Inject 50 Units into the skin at bedtime.   [DISCONTINUED] insulin lispro (HUMALOG KWIKPEN) 100 UNIT/ML KwikPen Inject 10-16 Units into the skin 3 (three) times daily before meals.   [DISCONTINUED] Semaglutide, 1 MG/DOSE, 4 MG/3ML SOPN Inject 1 mg as directed once a week. (Patient taking differently: Inject 1 mg as directed every Monday.)   insulin detemir (LEVEMIR) 100 UNIT/ML FlexPen Inject 40 Units into the skin at bedtime.   insulin lispro (HUMALOG KWIKPEN) 100 UNIT/ML KwikPen Inject 6-12 Units into the skin 3 (three) times daily.   [DISCONTINUED] metoprolol succinate (TOPROL-XL) 50 MG 24 hr tablet Take 1.5 tablets (75 mg total) by mouth daily. (Patient not taking: Reported on 07/25/2022)   No facility-administered encounter medications on file as of 09/07/2022.    ALLERGIES: Allergies  Allergen Reactions   Contrast Media [Iodinated Contrast Media] Anaphylaxis    "code blue--I died"   Penicillins Rash and Other (See Comments)    Childhood     VACCINATION STATUS: Immunization History  Administered Date(s) Administered   Influenza,trivalent, recombinat, inj, PF 07/05/2016   Influenza-Unspecified 08/26/2020   Moderna Sars-Covid-2 Vaccination 10/27/2019, 11/24/2019   Pneumococcal Polysaccharide-23 09/05/2014, 08/26/2020    Diabetes She presents for her follow-up diabetic visit. She has type 2 diabetes mellitus. Onset time: She was diagnosed at approximate age of 30. Her disease course has been stable. There are no hypoglycemic associated symptoms. Associated symptoms include blurred vision, fatigue and foot paresthesias. Pertinent negatives for diabetes include no polydipsia, no polyphagia, no polyuria and no weight loss. There are no hypoglycemic complications. Symptoms are stable. Diabetic complications include a CVA, heart disease, nephropathy, peripheral neuropathy and retinopathy. (Legally blind in right eye, says  she was hospitalized for DKA once; now on HD) Risk factors for coronary  artery disease include diabetes mellitus, dyslipidemia, hypertension, obesity, sedentary lifestyle and tobacco exposure. Current diabetic treatment includes intensive insulin program (and Ozempic). She is compliant with treatment most of the time. Her weight is fluctuating minimally. She is following a generally healthy diet. When asked about meal planning, she reported none. She has not had a previous visit with a dietitian. She rarely participates in exercise. Her home blood glucose trend is fluctuating minimally. Her overall blood glucose range is 180-200 mg/dl. (She presents today with her CGM showing fluctuating glycemic profile with higher postprandial readings.  Her POCT A1c today is 7.5%, improving from last visit of 7.9%.  She does note her sugar cravings have returned.  Analysis of her CGM shows TIR 40%, TAR 59%, TBR 1% with a GMI of 8%.  She denies any significant hypoglycemia but does see that when her glucose gets borderline low, she tends to have high spike as a result of overcorrection.) An ACE inhibitor/angiotensin II receptor blocker is not being taken. She does not see a podiatrist.Eye exam is current.  Hyperlipidemia This is a chronic problem. The current episode started more than 1 year ago. The problem is uncontrolled. Recent lipid tests were reviewed and are high. Exacerbating diseases include chronic renal disease, diabetes and obesity. Factors aggravating her hyperlipidemia include beta blockers and fatty foods. Current antihyperlipidemic treatment includes statins. The current treatment provides moderate improvement of lipids. Compliance problems include adherence to diet and adherence to exercise.  Risk factors for coronary artery disease include diabetes mellitus, dyslipidemia, hypertension, obesity and a sedentary lifestyle.  Hypertension This is a chronic problem. The current episode started more than 1 year ago. The problem has been gradually improving since onset. The problem is  controlled. Associated symptoms include blurred vision. There are no associated agents to hypertension. Risk factors for coronary artery disease include diabetes mellitus, dyslipidemia, obesity, sedentary lifestyle and smoking/tobacco exposure. Past treatments include calcium channel blockers, diuretics, central alpha agonists and beta blockers. The current treatment provides mild improvement. There are no compliance problems.  Hypertensive end-organ damage includes kidney disease, CAD/MI, CVA, heart failure and retinopathy. Identifiable causes of hypertension include chronic renal disease.    Review of systems  Constitutional: + Minimally fluctuating body weight,  current Body mass index is 39.14 kg/m. , + fatigue, no subjective hyperthermia, no subjective hypothermia Eyes: + blurry vision (diabetic retinopathy and legally blind in Right eye), no xerophthalmia ENT: no sore throat, no nodules palpated in throat, no dysphagia/odynophagia, no hoarseness Cardiovascular: no chest pain, no shortness of breath, no palpitations, no leg swelling Respiratory: no cough, no shortness of breath Gastrointestinal: no nausea/vomiting/diarrhea, + chronic constipation Musculoskeletal: no muscle/joint aches Skin: no rashes, no hyperemia Neurological: no tremors, no dizziness, + numbness/tingling to BLE R>L Psychiatric: no depression, no anxiety   Objective:     BP 113/71 (BP Location: Left Arm, Patient Position: Sitting, Cuff Size: Large)   Pulse 88   Ht 5\' 4"  (1.626 m)   Wt 228 lb (103.4 kg)   LMP  (LMP Unknown)   BMI 39.14 kg/m   Wt Readings from Last 3 Encounters:  09/07/22 228 lb (103.4 kg)  07/27/22 227 lb (103 kg)  07/13/22 230 lb 6.4 oz (104.5 kg)    BP Readings from Last 3 Encounters:  09/07/22 113/71  07/27/22 125/66  07/13/22 (!) 140/75     Physical Exam- Limited  Constitutional:  Body mass index is  39.14 kg/m. , not in acute distress, normal state of mind Eyes:  EOMI, no  exophthalmos Musculoskeletal: no gross deformities, strength intact in all four extremities, no gross restriction of joint movements Skin:  no rashes, no hyperemia Neurological: no tremor with outstretched hands   Diabetic Foot Exam - Simple   No data filed     CMP ( most recent) CMP     Component Value Date/Time   NA 134 (L) 05/05/2021 0843   K 5.6 (H) 05/05/2021 0843   CL 102 05/05/2021 0843   CO2 24 02/25/2021 0712   GLUCOSE 216 (H) 05/05/2021 0843   BUN 31 (A) 07/22/2021 0000   CREATININE 4.90 (H) 05/05/2021 0843   CALCIUM 9.1 07/22/2021 0000   PROT 6.8 02/22/2019 1222   ALBUMIN 2.9 (L) 02/22/2019 1222   AST 16 02/22/2019 1222   ALT 13 02/22/2019 1222   ALKPHOS 60 02/22/2019 1222   BILITOT 0.6 02/22/2019 1222   GFRNONAA 10 (L) 02/25/2021 0712   GFRAA 12 (L) 03/04/2019 0616     Diabetic Labs (most recent): Lab Results  Component Value Date   HGBA1C 7.5 (A) 09/07/2022   HGBA1C 7.9 (A) 03/07/2022   HGBA1C 9.0 12/01/2021     Lipid Panel ( most recent) Lipid Panel     Component Value Date/Time   CHOL 153 09/04/2022 1223   TRIG 122 09/04/2022 1223   HDL 55 09/04/2022 1223   CHOLHDL 2.8 09/04/2022 1223   CHOLHDL 5.4 05/23/2015 0545   VLDL 56 (H) 05/23/2015 0545   LDLCALC 76 09/04/2022 1223   LABVLDL 22 09/04/2022 1223      Lab Results  Component Value Date   TSH 0.524 09/04/2022   TSH 1.524 09/04/2014   FREET4 1.31 09/04/2022           Assessment & Plan:   1) Uncontrolled diabetes mellitus with stage 4 chronic kidney disease ([place])  She presents today with her CGM showing fluctuating glycemic profile with higher postprandial readings.  Her POCT A1c today is 7.5%, improving from last visit of 7.9%.  She does note her sugar cravings have returned.  Analysis of her CGM shows TIR 40%, TAR 59%, TBR 1% with a GMI of 8%.  She denies any significant hypoglycemia but does see that when her glucose gets borderline low, she tends to have high spike as a result  of overcorrection.  - Samantha Pierce has currently uncontrolled symptomatic type 2 DM since 52 years of age.  -Recent labs reviewed.  - I had a long discussion with her about the progressive nature of diabetes and the pathology behind its complications. -her diabetes is complicated by CVA, retinopathy, neuropathy, CKD, and DKA x 1 episode and she remains at a high risk for more acute and chronic complications which include worsening CAD, CVA, CKD, retinopathy, and neuropathy. These are all discussed in detail with her.  - Nutritional counseling repeated at each appointment due to patients tendency to fall back in to old habits.  - The patient admits there is a room for improvement in their diet and drink choices. -  Suggestion is made for the patient to avoid simple carbohydrates from their diet including Cakes, Sweet Desserts / Pastries, Ice Cream, Soda (diet and regular), Sweet Tea, Candies, Chips, Cookies, Sweet Pastries, Store Bought Juices, Alcohol in Excess of 1-2 drinks a day, Artificial Sweeteners, Coffee Creamer, and "Sugar-free" Products. This will help patient to have stable blood glucose profile and potentially avoid unintended weight gain.   -  I encouraged the patient to switch to unprocessed or minimally processed complex starch and increased protein intake (animal or plant source), fruits, and vegetables.   - Patient is advised to stick to a routine mealtimes to eat 3 meals a day and avoid unnecessary snacks (to snack only to correct hypoglycemia).  - she will be scheduled with Jearld Fenton, RDN, CDE for diabetes education.  - I have approached her with the following individualized plan to manage  her diabetes and patient agrees:   -She is advised to lower her dose of Levemir to 40 units SQ nightly and lower Novolog  to 6-12 units TID with meals if glucose is above 90 and she is eating (Specific instructions on how to titrate insulin dosage based on glucose readings given to  patient in writing).  I did increaes her Ozempic to 2 mg SQ weekly to continue to aide her in weight loss attempts and reduce insulin use.    -she is encouraged to continue monitoring blood glucose 4 times daily (using her CGM), before meals and before bed, and to call the clinic if she has readings less than 70 or greater than 300 for 3 tests in a row.   - she is warned not to take insulin without proper monitoring per orders. - Adjustment parameters are given to her for hypo and hyperglycemia in writing.  - she is not a candidate for Metformin, SGLT2 due to concurrent renal insufficiency.  - Specific targets for  A1c;  LDL, HDL,  and Triglycerides were discussed with the patient.  2) Blood Pressure /Hypertension:  her blood pressure is controlled to target.   she is advised to continue her current medications including Norvasc 10 mg po daily, Clonidine 0.2 mg po daily, Lasix 80 mg po twice daily, Hydralazine 50 mg po twice daily and Metoprolol 50 mg  mg p.o. daily with breakfast.  Will defer any med changes to nephrology.  3) Lipids/Hyperlipidemia:    Her recent lipid panel from 09/04/22 shows controlled LDL of 76.  She is advised to continue Lipitor 80 mg po daily before bed.  Side effects and precautions discussed with her.    4)  Weight/Diet:  her Body mass index is 39.14 kg/m.  -  clearly complicating her diabetes care.   she is a candidate for weight loss. I discussed with her the fact that loss of 5 - 10% of her  current body weight will have the most impact on her diabetes management.  Exercise, and detailed carbohydrates information provided  -  detailed on discharge instructions.  5) Chronic Care/Health Maintenance: -she is not on ACEI/ARB and is on Statin medications and is encouraged to initiate and continue to follow up with Ophthalmology, Dentist, Podiatrist at least yearly or according to recommendations, and advised to stay away from smoking. I have recommended yearly flu vaccine  and pneumonia vaccine at least every 5 years; moderate intensity exercise for up to 150 minutes weekly; and sleep for at least 7 hours a day.  - she is advised to maintain close follow up with Willene Hatchet, NP for primary care needs, as well as her other providers for optimal and coordinated care.      I spent  30  minutes in the care of the patient today including review of labs from [place], Lipids, Thyroid Function, Hematology (current and previous including abstractions from other facilities); face-to-face time discussing  her blood glucose readings/logs, discussing hypoglycemia and hyperglycemia episodes and symptoms, medications doses,  her options of short and long term treatment based on the latest standards of care / guidelines;  discussion about incorporating lifestyle medicine;  and documenting the encounter. Risk reduction counseling performed per USPSTF guidelines to reduce obesity and cardiovascular risk factors.     Please refer to Patient Instructions for Blood Glucose Monitoring and Insulin/Medications Dosing Guide"  in media tab for additional information. Please  also refer to " Patient Self Inventory" in the Media  tab for reviewed elements of pertinent patient history.  Samantha Pierce participated in the discussions, expressed understanding, and voiced agreement with the above plans.  All questions were answered to her satisfaction. she is encouraged to contact clinic should she have any questions or concerns prior to her return visit.   Follow up plan: - Return in about 3 months (around 12/07/2022) for Diabetes F/U with A1c in office, No previsit labs, Bring meter and logs.  [name], [facility] [address] [place], [place] [address] Phone: [phone] Fax: [phone]  09/07/2022, 8:52 AM

## 2022-09-07 NOTE — Patient Instructions (Signed)

## 2022-10-18 ENCOUNTER — Other Ambulatory Visit: Payer: Self-pay | Admitting: Vascular Surgery

## 2022-10-24 ENCOUNTER — Other Ambulatory Visit (HOSPITAL_COMMUNITY): Payer: Self-pay | Admitting: Nurse Practitioner

## 2022-10-24 DIAGNOSIS — Z1231 Encounter for screening mammogram for malignant neoplasm of breast: Secondary | ICD-10-CM

## 2022-11-16 ENCOUNTER — Ambulatory Visit (HOSPITAL_COMMUNITY)
Admission: RE | Admit: 2022-11-16 | Discharge: 2022-11-16 | Disposition: A | Discharge status: Home / Self Care | Payer: 59 | Source: Ambulatory Visit | Attending: Nurse Practitioner | Admitting: Nurse Practitioner

## 2022-11-16 DIAGNOSIS — Z1231 Encounter for screening mammogram for malignant neoplasm of breast: Secondary | ICD-10-CM | POA: Insufficient documentation

## 2022-11-21 ENCOUNTER — Ambulatory Visit: Payer: 59 | Admitting: Podiatry

## 2022-12-08 ENCOUNTER — Encounter: Payer: Self-pay | Admitting: Internal Medicine

## 2022-12-13 ENCOUNTER — Encounter: Payer: Self-pay | Admitting: *Deleted

## 2022-12-14 ENCOUNTER — Other Ambulatory Visit: Payer: Self-pay

## 2022-12-14 MED ORDER — TRESIBA FLEXTOUCH 100 UNIT/ML ~~LOC~~ SOPN: 40.0000 [IU] | PEN_INJECTOR | Freq: Every day | SUBCUTANEOUS | 2 refills | Status: DC

## 2022-12-19 ENCOUNTER — Ambulatory Visit: Payer: 59 | Admitting: Nurse Practitioner

## 2022-12-29 ENCOUNTER — Other Ambulatory Visit: Payer: Self-pay | Admitting: Nurse Practitioner

## 2023-01-16 ENCOUNTER — Ambulatory Visit (INDEPENDENT_AMBULATORY_CARE_PROVIDER_SITE_OTHER): Payer: 59 | Admitting: Nurse Practitioner

## 2023-01-16 ENCOUNTER — Encounter: Payer: Self-pay | Admitting: Nurse Practitioner

## 2023-01-16 VITALS — BP 131/67 | HR 87 | Ht 64.0 in | Wt 220.4 lb

## 2023-01-16 DIAGNOSIS — E1165 Type 2 diabetes mellitus with hyperglycemia: Secondary | ICD-10-CM

## 2023-01-16 DIAGNOSIS — E782 Mixed hyperlipidemia: Secondary | ICD-10-CM | POA: Diagnosis not present

## 2023-01-16 DIAGNOSIS — Z7985 Long-term (current) use of injectable non-insulin antidiabetic drugs: Secondary | ICD-10-CM | POA: Diagnosis not present

## 2023-01-16 DIAGNOSIS — Z794 Long term (current) use of insulin: Secondary | ICD-10-CM

## 2023-01-16 DIAGNOSIS — I10 Essential (primary) hypertension: Secondary | ICD-10-CM | POA: Diagnosis not present

## 2023-01-16 LAB — POCT GLYCOSYLATED HEMOGLOBIN (HGB A1C): Hemoglobin A1C: 7.4 % — AB (ref 4.0–5.6)

## 2023-01-16 MED ORDER — OZEMPIC (2 MG/DOSE) 8 MG/3ML ~~LOC~~ SOPN: 2.0000 mg | PEN_INJECTOR | SUBCUTANEOUS | 3 refills | Status: DC

## 2023-01-16 NOTE — Patient Instructions (Signed)
 High-Fiber Eating Plan Fiber, also called dietary fiber, is found in foods such as fruits, vegetables, whole grains, and beans. A high-fiber diet can be good for your health. Your health care provider may recommend a high-fiber diet to help: Prevent trouble pooping (constipation). Lower your cholesterol. Treat the following conditions: Hemorrhoids. This is inflammation of veins in the anus. Inflammation of specific areas of the digestive tract. Irritable bowel syndrome (IBS). This is a problem of the large intestine, also called the colon, that sometimes causes belly pain and bloating. Prevent overeating as part of a weight-loss plan. Lower the risk of heart disease, type 2 diabetes, and certain cancers. What are tips for following this plan? Reading food labels  Check the nutrition facts label on foods for the amount of dietary fiber. Choose foods that have 4 grams of fiber or more per serving. The recommended goals for how much fiber you should eat each day include: Males 40 years old or younger: 30-34 g. Males over 37 years old: 28-34 g. Females 64 years old or younger: 25-28 g. Females over 69 years old: 22-25 g. Your daily fiber goal is _____________ g. Shopping Choose whole fruits and vegetables instead of processed. For example, choose apples instead of apple juice or applesauce. Choose a variety of high-fiber foods such as avocados, lentils, oats, and pinto beans. Read the nutrition facts label on foods. Check for foods with added fiber. These foods often have high sugar and salt (sodium) amounts per serving. Cooking Use whole-grain flour for baking and cooking. Cook with brown rice instead of white rice. Make meals that have a lot of beans and vegetables in them, such as chili or vegetable-based soups. Meal planning Start the day with a breakfast that is high in fiber, such as a cereal that has 5 g of fiber or more per serving. Eat breads and cereals that are made with  whole-grain flour instead of refined flour or white flour. Eat brown rice, bulgur wheat, or millet instead of white rice. Use beans in place of meat in soups, salads, and pasta dishes. Be sure that half of the grains you eat each day are whole grains. General information You can get the recommended amount of dietary fiber by: Eating a variety of fruits, vegetables, grains, nuts, and beans. Taking a fiber supplement if you aren't able to eat enough fiber. It's better to get fiber through food than from a supplement. Slowly increase how much fiber you eat. If you increase the amount of fiber you eat too quickly, you may have bloating, cramping, or gas. Drink plenty of water to help you digest fiber. Choose high-fiber snacks, such as berries, raw vegetables, nuts, and popcorn. What foods should I eat? Fruits Berries. Pears. Apples. Oranges. Avocado. Prunes and raisins. Dried figs. Vegetables Sweet potatoes. Spinach. Kale. Artichokes. Cabbage. Broccoli. Cauliflower. Green peas. Carrots. Squash. Grains Whole-grain breads. Multigrain cereal. Oats and oatmeal. Brown rice. Barley. Bulgur wheat. Millet. Quinoa. Bran muffins. Popcorn. Rye wafer crackers. Meats and other proteins Navy beans, kidney beans, and pinto beans. Soybeans. Split peas. Lentils. Nuts and seeds. Dairy Fiber-fortified yogurt. Fortified means that fiber has been added to the product. Beverages Fiber-fortified soy milk. Fiber-fortified orange juice. Other foods Fiber bars. The items listed above may not be all the foods and drinks you can have. Talk to a dietitian to learn more. What foods should I avoid? Fruits Fruit juice. Cooked, strained fruit. Vegetables Fried potatoes. Canned vegetables. Well-cooked vegetables. Grains White bread. Pasta made with refined  flour. White rice. Meats and other proteins Fatty meat. Fried chicken or fried fish. Dairy Milk. Cream cheese. Sour cream. Fats and  oils Butters. Beverages Soft drinks. Other foods Cakes and pastries. The items listed above may not be all the foods and drinks you should avoid. Talk to a dietitian to learn more. This information is not intended to replace advice given to you by your health care provider. Make sure you discuss any questions you have with your health care provider. Document Revised: 08/14/2022 Document Reviewed: 08/14/2022 [employer] Patient Education  2024 [employer].

## 2023-01-16 NOTE — Progress Notes (Signed)
Endocrinology Follow Up Visit      01/16/2023, 2:36 PM   Subjective:    Patient ID: Samantha Pierce, female    DOB: November 06, 1970.  Samantha Pierce is being seen in follow up after being seen in consultation for management of currently uncontrolled symptomatic diabetes requested by  Estevan Oaks, NP.   Past Medical History:  Diagnosis Date   Anemia    Anxiety    Asystole (HCC)    a. 05/2015: pt developed bradycardia->asystole during Lexiscan nuclear stress test, s/p brief code. Cath with only 30% prox LCx. Her asystole during Lexiscan infusion was felt to represent an excessive pharmacologic response to the agent and likely superimposed vasovagal response.   Bipolar disorder (HCC)    CKD (chronic kidney disease) stage 4, GFR 15-29 ml/min (HCC)    M_W_F dialysis   COPD (chronic obstructive pulmonary disease) (HCC)    Depression    Essential hypertension    GERD (gastroesophageal reflux disease)    History of blood transfusion 2011   History of seizure 2017   only one seizure - none since   History of stroke 09/2014   Acute lacunar infarct of the left thalamus   Leg cramps    Mild CAD    a. LHC 05/2015: 30% prox Cx, otherwise widely patent.   Morbid obesity (HCC)    Neuromuscular disorder (HCC)    neuropathy in feet   Neuropathy    feet   PTSD (post-traumatic stress disorder)    Stroke (HCC)    x 2, right side weakness   Type 2 diabetes mellitus (HCC)     Past Surgical History:  Procedure Laterality Date   AV FISTULA PLACEMENT Right 01/08/2017   Procedure: RIGHT ARM BRACHIOCEPHALIC ARTERIOVENOUS (AV) FISTULA CREATION;  Surgeon: Maeola Harman, MD;  Location: [facility] OR;  Service: Vascular;  Laterality: Right;   AV FISTULA PLACEMENT Left 11/20/2018   Procedure: Creation of LEFT ARM ARTERIOVENOUS (AV) FISTULA;  Surgeon: Maeola Harman, MD;  Location: [facility] OR;  Service: Vascular;   Laterality: Left;   AV FISTULA PLACEMENT Right 02/24/2021   Procedure: RIGHT UPPER ARM BASILIC VEIN ARTERIOVENOUS (AV) FISTULA CREATION;  Surgeon: Leonie Douglas, MD;  Location: [facility] OR;  Service: Vascular;  Laterality: Right;   AV FISTULA PLACEMENT Right 05/05/2021   Procedure: INSERTION OF ARTERIOVENOUS (AV) GORE-TEX GRAFT ARM;  Surgeon: Leonie Douglas, MD;  Location: [facility] OR;  Service: Vascular;  Laterality: Right;   BASCILIC VEIN TRANSPOSITION Left 02/11/2019   Procedure: BASILIC VEIN TRANSPOSITION SECOND STAGE;  Surgeon: Maeola Harman, MD;  Location: [facility] OR;  Service: Vascular;  Laterality: Left;   CARDIAC CATHETERIZATION N/A 06/01/2015   Procedure: Left Heart Cath and Coronary Angiography;  Surgeon: Lyn Records, MD; CFX 30%, no other dz, EF nl by echo   CESAREAN SECTION     x2   COLONOSCOPY WITH PROPOFOL N/A 07/27/2022   Procedure: COLONOSCOPY WITH PROPOFOL;  Surgeon: Lanelle Bal, DO;  Location: [facility]ITE;  Service: Endoscopy;  Laterality: N/A;  930am, asa 3, dialysis pt   DILATION AND CURETTAGE OF UTERUS     EYE SURGERY     Right eye  pars plano vitrectomy    HEMATOMA EVACUATION Left 03/03/2019   Procedure: EVACUATION HEMATOMA;  Surgeon: Sherren Kerns, MD;  Location: [facility] OR;  Service: Vascular;  Laterality: Left;   INSERTION OF DIALYSIS CATHETER Right 01/04/2021   Procedure: INSERTION OF RIGHT INTERNAL JUGULAR TUNNELED DIALYSIS CATHETER;  Surgeon: Sherren Kerns, MD;  Location: [facility] OR;  Service: Vascular;  Laterality: Right;   LASIK     LIGATION OF ARTERIOVENOUS  FISTULA Left 01/04/2021   Procedure: LIGATION OF LEFT ARM ARTERIOVENOUS  FISTULA;  Surgeon: Sherren Kerns, MD;  Location: [facility] OR;  Service: Vascular;  Laterality: Left;   PARS PLANA VITRECTOMY  04/20/2011   Procedure: PARS PLANA VITRECTOMY WITH 25 GAUGE;  Surgeon: Sherrie George, MD;  Location: [facility] OR;  Service: Ophthalmology;  Laterality: Left;  membrane peel, gas fluid exchange, endolaser, repair of  complex retinal detachment   PATCH ANGIOPLASTY Left 10/05/2020   Procedure: PATCH ANGIOPLASTY USING Livia Snellen BIOLOGIC PATCH;  Surgeon: Sherren Kerns, MD;  Location: [facility] OR;  Service: Vascular;  Laterality: Left;   PERIPHERAL VASCULAR BALLOON ANGIOPLASTY Left 09/02/2020   Procedure: PERIPHERAL VASCULAR BALLOON ANGIOPLASTY;  Surgeon: Cephus Shelling, MD;  Location: [facility] INVASIVE CV LAB;  Service: Cardiovascular;  Laterality: Left;  arm fistula   REMOVAL OF A DIALYSIS CATHETER N/A 06/21/2021   Procedure: MINOR REMOVAL OF TUNNELED DIALYSIS CATHETER;  Surgeon: Larina Earthly, MD;  Location: [facility] ORS;  Service: Vascular;  Laterality: N/A;   REVISON OF ARTERIOVENOUS FISTULA Left 10/05/2020   Procedure: REVISION OF LEFT ARM ARTERIOVENOUS FISTULA;  Surgeon: Sherren Kerns, MD;  Location: [facility] OR;  Service: Vascular;  Laterality: Left;   TUBAL LIGATION      Social History   Socioeconomic History   Marital status: Single    Spouse name: Not on file   Number of children: Not on file   Years of education: Not on file   Highest education level: Not on file  Occupational History   Not on file  Tobacco Use   Smoking status: Former    Current packs/day: 0.00    Average packs/day: (1.2 ttl pk-yrs)    Types: Cigarettes    Start date: 06/05/1981    Quit date: 06/2022    Years since quitting: 0.6   Smokeless tobacco: Never   Tobacco comments:    Quit in 12/2018 but now smokes off and on per patient 05/04/21.  Vaping Use   Vaping status: Never Used  Substance and Sexual Activity   Alcohol use: Yes    Comment: occ wine   Drug use: No   Sexual activity: Yes    Birth control/protection: Surgical    Comment: Tubal Ligation  Other Topics Concern   Not on file  Social History Narrative   Not on file   Social Determinants of Health   Financial Resource Strain: Not on file  Food Insecurity: Not on file  Transportation Needs: Not on file  Physical Activity: Not on file  Stress: Not on file  Social  Connections: Not on file    Family History  Problem Relation Age of Onset   Diabetes Mother    Kidney failure Mother    Diabetes Father    Amblyopia Neg Hx    Blindness Neg Hx    Cataracts Neg Hx    Glaucoma Neg Hx    Macular degeneration Neg Hx    Retinal detachment Neg Hx    Strabismus Neg Hx    Retinitis pigmentosa Neg  Hx     Outpatient Encounter Medications as of 01/16/2023  Medication Sig   amLODipine (NORVASC) 10 MG tablet Take 1 tablet (10 mg total) by mouth daily.   atorvastatin (LIPITOR) 80 MG tablet Take 1 tablet (80 mg total) by mouth daily at 6 PM. (Patient taking differently: Take 80 mg by mouth at bedtime.)   cloNIDine (CATAPRES) 0.2 MG tablet Take 0.2 mg by mouth 2 (two) times daily.   FLUoxetine (PROZAC) 10 MG tablet Take 10 mg by mouth daily. Take with 20 mg for a total of 30 mg daily   FLUoxetine (PROZAC) 20 MG capsule Take 20 mg by mouth daily. Take with 10 mg for a total of 30 mg daily   furosemide (LASIX) 80 MG tablet Take 80 mg by mouth 2 (two) times daily.   gabapentin (NEURONTIN) 300 MG capsule TAKE (1) CAPSULE BY MOUTH DAILY. (Patient taking differently: Take 300 mg by mouth at bedtime.)   hydrALAZINE (APRESOLINE) 50 MG tablet Take 50 mg by mouth 2 (two) times daily.   hydrOXYzine (ATARAX) 25 MG tablet Take 25 mg by mouth 3 (three) times daily as needed for itching.   insulin degludec (TRESIBA FLEXTOUCH) 100 UNIT/ML FlexTouch Pen Inject 40 Units into the skin at bedtime.   insulin lispro (HUMALOG KWIKPEN) 100 UNIT/ML KwikPen Inject 6-12 Units into the skin 3 (three) times daily.   Lancets (ONETOUCH DELICA PLUS LANCET33G) MISC Apply topically.   linaclotide (LINZESS) 290 MCG CAPS capsule Take 1 capsule (290 mcg total) by mouth daily before breakfast.   omeprazole (PRILOSEC) 20 MG capsule Take 20 mg by mouth daily before breakfast.    ONETOUCH VERIO test strip SMARTSIG:Via Meter   traZODone (DESYREL) 100 MG tablet Take 100 mg by mouth at bedtime.    [DISCONTINUED] OZEMPIC, 2 MG/DOSE, 8 MG/3ML SOPN INJECT 2 MG ONCE A WEEK AS DIRECTED   Continuous Blood Gluc Receiver (DEXCOM G6 RECEIVER) DEVI USE TO CHECK GLUCOSE FOUR TIMES DAILY AS DIRECTED. (Patient not taking: Reported on 01/16/2023)   Semaglutide, 2 MG/DOSE, (OZEMPIC, 2 MG/DOSE,) 8 MG/3ML SOPN Inject 2 mg into the skin once a week.   No facility-administered encounter medications on file as of 01/16/2023.    ALLERGIES: Allergies  Allergen Reactions   Contrast Media [Iodinated Contrast Media] Anaphylaxis    "code blue--I died"   Penicillins Rash and Other (See Comments)    Childhood     VACCINATION STATUS: Immunization History  Administered Date(s) Administered   Influenza,trivalent, recombinat, inj, PF 07/05/2016   Influenza-Unspecified 08/26/2020   Moderna Sars-Covid-2 Vaccination 10/27/2019, 11/24/2019   Pneumococcal Polysaccharide-23 09/05/2014, 08/26/2020    Diabetes She presents for her follow-up diabetic visit. She has type 2 diabetes mellitus. Onset time: She was diagnosed at approximate age of 73. Her disease course has been improving. There are no hypoglycemic associated symptoms. Associated symptoms include blurred vision, fatigue, foot paresthesias and weight loss. Pertinent negatives for diabetes include no polydipsia, no polyphagia and no polyuria. There are no hypoglycemic complications. Symptoms are stable. Diabetic complications include a CVA, heart disease, nephropathy, peripheral neuropathy and retinopathy. (Legally blind in right eye, says she was hospitalized for DKA once; now on HD) Risk factors for coronary artery disease include diabetes mellitus, dyslipidemia, hypertension, obesity, sedentary lifestyle and tobacco exposure. Current diabetic treatment includes intensive insulin program (and Ozempic). She is compliant with treatment most of the time. Her weight is fluctuating minimally. She is following a generally healthy diet. When asked about meal planning,  she reported none. She  has not had a previous visit with a dietitian. She rarely participates in exercise. Her home blood glucose trend is fluctuating minimally. Her overall blood glucose range is 180-200 mg/dl. (She presents today with her CGM showing slightly above target glycemic profile.  Her POCT A1c today is 7.4%, improving from last visit of 7.5%.  She notes she has had a head cold for about a week now which has driven her glucose readings up a bit.  Analysis of her CGM shows TIR 46%, TAR 54%, TBR 0% with a GMI of 7.8%.  She did report an occasional, mild drop in glucose (usually in early morning).) An ACE inhibitor/angiotensin II receptor blocker is not being taken. She does not see a podiatrist.Eye exam is current.  Hyperlipidemia This is a chronic problem. The current episode started more than 1 year ago. The problem is uncontrolled. Recent lipid tests were reviewed and are high. Exacerbating diseases include chronic renal disease, diabetes and obesity. Factors aggravating her hyperlipidemia include beta blockers and fatty foods. Current antihyperlipidemic treatment includes statins. The current treatment provides moderate improvement of lipids. Compliance problems include adherence to diet and adherence to exercise.  Risk factors for coronary artery disease include diabetes mellitus, dyslipidemia, hypertension, obesity and a sedentary lifestyle.  Hypertension This is a chronic problem. The current episode started more than 1 year ago. The problem has been gradually improving since onset. The problem is controlled. Associated symptoms include blurred vision. There are no associated agents to hypertension. Risk factors for coronary artery disease include diabetes mellitus, dyslipidemia, obesity, sedentary lifestyle and smoking/tobacco exposure. Past treatments include calcium channel blockers, diuretics, central alpha agonists and beta blockers. The current treatment provides mild improvement. There are  no compliance problems.  Hypertensive end-organ damage includes kidney disease, CAD/MI, CVA, heart failure and retinopathy. Identifiable causes of hypertension include chronic renal disease.    Review of systems  Constitutional: + decreasing body weight,  current Body mass index is 37.83 kg/m. , + fatigue, no subjective hyperthermia, no subjective hypothermia Eyes: + blurry vision (diabetic retinopathy and legally blind in Right eye), no xerophthalmia ENT: no sore throat, no nodules palpated in throat, no dysphagia/odynophagia, no hoarseness Cardiovascular: no chest pain, no shortness of breath, no palpitations, no leg swelling Respiratory: no cough, no shortness of breath Gastrointestinal: no nausea/vomiting/diarrhea, + chronic constipation r/t gastroparesis Musculoskeletal: no muscle/joint aches Skin: no rashes, no hyperemia Neurological: no tremors, no dizziness, + numbness/tingling to BLE R>L Psychiatric: no depression, no anxiety   Objective:     BP 131/67 (BP Location: Left Arm, Patient Position: Sitting, Cuff Size: Large)   Pulse 87   Ht 5\' 4"  (1.626 m)   Wt 220 lb 6.4 oz (100 kg)   LMP  (LMP Unknown)   BMI 37.83 kg/m   Wt Readings from Last 3 Encounters:  01/16/23 220 lb 6.4 oz (100 kg)  09/07/22 228 lb (103.4 kg)  07/27/22 227 lb (103 kg)    BP Readings from Last 3 Encounters:  01/16/23 131/67  09/07/22 113/71  07/27/22 125/66      Physical Exam- Limited  Constitutional:  Body mass index is 37.83 kg/m. , not in acute distress, normal state of mind Eyes:  EOMI, no exophthalmos Musculoskeletal: no gross deformities, strength intact in all four extremities, no gross restriction of joint movements Skin:  no rashes, no hyperemia Neurological: no tremor with outstretched hands   Diabetic Foot Exam - Simple   No data filed     CMP ( most recent)  CMP     Component Value Date/Time   NA 134 (L) 05/05/2021 0843   K 5.6 (H) 05/05/2021 0843   CL 102  05/05/2021 0843   CO2 24 02/25/2021 0712   GLUCOSE 216 (H) 05/05/2021 0843   BUN 31 (A) 07/22/2021 0000   CREATININE 4.90 (H) 05/05/2021 0843   CALCIUM 9.1 07/22/2021 0000   PROT 6.8 02/22/2019 1222   ALBUMIN 2.9 (L) 02/22/2019 1222   AST 16 02/22/2019 1222   ALT 13 02/22/2019 1222   ALKPHOS 60 02/22/2019 1222   BILITOT 0.6 02/22/2019 1222   GFRNONAA 10 (L) 02/25/2021 0712   GFRAA 12 (L) 03/04/2019 0616     Diabetic Labs (most recent): Lab Results  Component Value Date   HGBA1C 7.4 (A) 01/16/2023   HGBA1C 7.5 (A) 09/07/2022   HGBA1C 7.9 (A) 03/07/2022     Lipid Panel ( most recent) Lipid Panel     Component Value Date/Time   CHOL 153 09/04/2022 1223   TRIG 122 09/04/2022 1223   HDL 55 09/04/2022 1223   CHOLHDL 2.8 09/04/2022 1223   CHOLHDL 5.4 05/23/2015 0545   VLDL 56 (H) 05/23/2015 0545   LDLCALC 76 09/04/2022 1223   LABVLDL 22 09/04/2022 1223      Lab Results  Component Value Date   TSH 0.524 09/04/2022   TSH 1.524 09/04/2014   FREET4 1.31 09/04/2022           Assessment & Plan:   1) Controlled diabetes mellitus with stage 4 chronic kidney disease (HCC)  She presents today with her CGM showing slightly above target glycemic profile.  Her POCT A1c today is 7.4%, improving from last visit of 7.5%.  She notes she has had a head cold for about a week now which has driven her glucose readings up a bit.  Analysis of her CGM shows TIR 46%, TAR 54%, TBR 0% with a GMI of 7.8%.  She did report an occasional, mild drop in glucose (usually in early morning).  - Samantha Pierce has currently uncontrolled symptomatic type 2 DM since 52 years of age.  -Recent labs reviewed.  - I had a long discussion with her about the progressive nature of diabetes and the pathology behind its complications. -her diabetes is complicated by CVA, retinopathy, neuropathy, CKD, and DKA x 1 episode and she remains at a high risk for more acute and chronic complications which include  worsening CAD, CVA, CKD, retinopathy, and neuropathy. These are all discussed in detail with her.  - Nutritional counseling repeated at each appointment due to patients tendency to fall back in to old habits.  - The patient admits there is a room for improvement in their diet and drink choices. -  Suggestion is made for the patient to avoid simple carbohydrates from their diet including Cakes, Sweet Desserts / Pastries, Ice Cream, Soda (diet and regular), Sweet Tea, Candies, Chips, Cookies, Sweet Pastries, Store Bought Juices, Alcohol in Excess of 1-2 drinks a day, Artificial Sweeteners, Coffee Creamer, and "Sugar-free" Products. This will help patient to have stable blood glucose profile and potentially avoid unintended weight gain.   - I encouraged the patient to switch to unprocessed or minimally processed complex starch and increased protein intake (animal or plant source), fruits, and vegetables.   - Patient is advised to stick to a routine mealtimes to eat 3 meals a day and avoid unnecessary snacks (to snack only to correct hypoglycemia).  - she will be scheduled with Norm Salt, RDN,  CDE for diabetes education.  - I have approached her with the following individualized plan to manage  her diabetes and patient agrees:   -She is advised to lower her dose of Tresiba to 40 units SQ nightly and continue her Novolog   6-12 units TID with meals if glucose is above 90 and she is eating (Specific instructions on how to titrate insulin dosage based on glucose readings given to patient in writing). She can also continue Ozempic 2 mg SQ weekly.  I did recommend high fiber diet to help with nausea likely related to gastroparesis complicated by the Ozempic.    -she is encouraged to continue monitoring blood glucose 4 times daily (using her CGM), before meals and before bed, and to call the clinic if she has readings less than 70 or greater than 300 for 3 tests in a row.   - she is warned not to take  insulin without proper monitoring per orders. - Adjustment parameters are given to her for hypo and hyperglycemia in writing.  - she is not a candidate for Metformin, SGLT2 due to concurrent renal insufficiency.  - Specific targets for  A1c;  LDL, HDL,  and Triglycerides were discussed with the patient.  2) Blood Pressure /Hypertension:  her blood pressure is controlled to target.   she is advised to continue her current medications including Norvasc 10 mg po daily, Clonidine 0.2 mg po daily, Lasix 80 mg po twice daily, Hydralazine 50 mg po twice daily and Metoprolol 50 mg  mg p.o. daily with breakfast.  Will defer any med changes to nephrology.  3) Lipids/Hyperlipidemia:    Her recent lipid panel from 09/04/22 shows controlled LDL of 76.  She is advised to continue Lipitor 80 mg po daily before bed.  Side effects and precautions discussed with her.    4)  Weight/Diet:  her Body mass index is 37.83 kg/m.  -  clearly complicating her diabetes care.   she is a candidate for weight loss. I discussed with her the fact that loss of 5 - 10% of her  current body weight will have the most impact on her diabetes management.  Exercise, and detailed carbohydrates information provided  -  detailed on discharge instructions.  5) Chronic Care/Health Maintenance: -she is not on ACEI/ARB and is on Statin medications and is encouraged to initiate and continue to follow up with Ophthalmology, Dentist, Podiatrist at least yearly or according to recommendations, and advised to stay away from smoking. I have recommended yearly flu vaccine and pneumonia vaccine at least every 5 years; moderate intensity exercise for up to 150 minutes weekly; and sleep for at least 7 hours a day.  - she is advised to maintain close follow up with Estevan Oaks, NP for primary care needs, as well as her other providers for optimal and coordinated care.     I spent  46  minutes in the care of the patient today including review of  labs from CMP, Lipids, Thyroid Function, Hematology (current and previous including abstractions from other facilities); face-to-face time discussing  her blood glucose readings/logs, discussing hypoglycemia and hyperglycemia episodes and symptoms, medications doses, her options of short and long term treatment based on the latest standards of care / guidelines;  discussion about incorporating lifestyle medicine;  and documenting the encounter. Risk reduction counseling performed per USPSTF guidelines to reduce obesity and cardiovascular risk factors.     Please refer to Patient Instructions for Blood Glucose Monitoring and Insulin/Medications Dosing  Guide"  in media tab for additional information. Please  also refer to " Patient Self Inventory" in the Media  tab for reviewed elements of pertinent patient history.  Samantha Pierce participated in the discussions, expressed understanding, and voiced agreement with the above plans.  All questions were answered to her satisfaction. she is encouraged to contact clinic should she have any questions or concerns prior to her return visit.   Follow up plan: - Return in about 3 months (around 04/18/2023) for Diabetes F/U with A1c in office, No previsit labs, Bring meter and logs.  Ronny Bacon, [facility] [address] [place], [place] [address] Phone: [phone] Fax: [phone]  01/16/2023, 2:36 PM

## 2023-03-21 NOTE — Progress Notes (Unsigned)
Referring Provider: Estevan Oaks, NP Primary Care Physician:  Estevan Oaks, NP Primary GI Physician: Dr. Marletta Lor  No chief complaint on file.   HPI:   Samantha Pierce is a 52 y.o. female presenting today with a history of GERD, chronic constipation,   Last seen in the office November 2023. Was planning to schedule for first ever screening colonoscopy and also advised to start MiraLAX daily for constipation.  Advised to continue omeprazole 20 mg daily.  Colonoscopy 07/27/2022 with inadequate prep, stool in the entire colon. Recommended repeat colonoscopy in 6 months due to poor prep . Also advised to start Linzess 290 mcg daily.   Today:    Past Medical History:  Diagnosis Date   Anemia    Anxiety    Asystole (HCC)    a. 05/2015: pt developed bradycardia->asystole during Lexiscan nuclear stress test, s/p brief code. Cath with only 30% prox LCx. Her asystole during Lexiscan infusion was felt to represent an excessive pharmacologic response to the agent and likely superimposed vasovagal response.   Bipolar disorder (HCC)    CKD (chronic kidney disease) stage 4, GFR 15-29 ml/min (HCC)    M_W_F dialysis   COPD (chronic obstructive pulmonary disease) (HCC)    Depression    Essential hypertension    GERD (gastroesophageal reflux disease)    History of blood transfusion 2011   History of seizure 2017   only one seizure - none since   History of stroke 09/2014   Acute lacunar infarct of the left thalamus   Leg cramps    Mild CAD    a. LHC 05/2015: 30% prox Cx, otherwise widely patent.   Morbid obesity (HCC)    Neuromuscular disorder (HCC)    neuropathy in feet   Neuropathy    feet   PTSD (post-traumatic stress disorder)    Stroke (HCC)    x 2, right side weakness   Type 2 diabetes mellitus (HCC)     Past Surgical History:  Procedure Laterality Date   AV FISTULA PLACEMENT Right 01/08/2017   Procedure: RIGHT ARM BRACHIOCEPHALIC ARTERIOVENOUS (AV) FISTULA  CREATION;  Surgeon: Maeola Harman, MD;  Location: [facility] OR;  Service: Vascular;  Laterality: Right;   AV FISTULA PLACEMENT Left 11/20/2018   Procedure: Creation of LEFT ARM ARTERIOVENOUS (AV) FISTULA;  Surgeon: Maeola Harman, MD;  Location: [facility] OR;  Service: Vascular;  Laterality: Left;   AV FISTULA PLACEMENT Right 02/24/2021   Procedure: RIGHT UPPER ARM BASILIC VEIN ARTERIOVENOUS (AV) FISTULA CREATION;  Surgeon: Leonie Douglas, MD;  Location: [facility] OR;  Service: Vascular;  Laterality: Right;   AV FISTULA PLACEMENT Right 05/05/2021   Procedure: INSERTION OF ARTERIOVENOUS (AV) GORE-TEX GRAFT ARM;  Surgeon: Leonie Douglas, MD;  Location: [facility] OR;  Service: Vascular;  Laterality: Right;   BASCILIC VEIN TRANSPOSITION Left 02/11/2019   Procedure: BASILIC VEIN TRANSPOSITION SECOND STAGE;  Surgeon: Maeola Harman, MD;  Location: [facility] OR;  Service: Vascular;  Laterality: Left;   CARDIAC CATHETERIZATION N/A 06/01/2015   Procedure: Left Heart Cath and Coronary Angiography;  Surgeon: Lyn Records, MD; CFX 30%, no other dz, EF nl by echo   CESAREAN SECTION     x2   COLONOSCOPY WITH PROPOFOL N/A 07/27/2022   Procedure: COLONOSCOPY WITH PROPOFOL;  Surgeon: Lanelle Bal, DO;  Location: [facility]ITE;  Service: Endoscopy;  Laterality: N/A;  930am, asa 3, dialysis pt   DILATION AND CURETTAGE OF UTERUS     EYE  SURGERY     Right eye pars plano vitrectomy    HEMATOMA EVACUATION Left 03/03/2019   Procedure: EVACUATION HEMATOMA;  Surgeon: Sherren Kerns, MD;  Location: [facility] OR;  Service: Vascular;  Laterality: Left;   INSERTION OF DIALYSIS CATHETER Right 01/04/2021   Procedure: INSERTION OF RIGHT INTERNAL JUGULAR TUNNELED DIALYSIS CATHETER;  Surgeon: Sherren Kerns, MD;  Location: [facility] OR;  Service: Vascular;  Laterality: Right;   LASIK     LIGATION OF ARTERIOVENOUS  FISTULA Left 01/04/2021   Procedure: LIGATION OF LEFT ARM ARTERIOVENOUS  FISTULA;  Surgeon: Sherren Kerns, MD;   Location: [facility] OR;  Service: Vascular;  Laterality: Left;   PARS PLANA VITRECTOMY  04/20/2011   Procedure: PARS PLANA VITRECTOMY WITH 25 GAUGE;  Surgeon: Sherrie George, MD;  Location: [facility] OR;  Service: Ophthalmology;  Laterality: Left;  membrane peel, gas fluid exchange, endolaser, repair of complex retinal detachment   PATCH ANGIOPLASTY Left 10/05/2020   Procedure: PATCH ANGIOPLASTY USING Livia Snellen BIOLOGIC PATCH;  Surgeon: Sherren Kerns, MD;  Location: [facility] OR;  Service: Vascular;  Laterality: Left;   PERIPHERAL VASCULAR BALLOON ANGIOPLASTY Left 09/02/2020   Procedure: PERIPHERAL VASCULAR BALLOON ANGIOPLASTY;  Surgeon: Cephus Shelling, MD;  Location: [facility] INVASIVE CV LAB;  Service: Cardiovascular;  Laterality: Left;  arm fistula   REMOVAL OF A DIALYSIS CATHETER N/A 06/21/2021   Procedure: MINOR REMOVAL OF TUNNELED DIALYSIS CATHETER;  Surgeon: Larina Earthly, MD;  Location: [facility] ORS;  Service: Vascular;  Laterality: N/A;   REVISON OF ARTERIOVENOUS FISTULA Left 10/05/2020   Procedure: REVISION OF LEFT ARM ARTERIOVENOUS FISTULA;  Surgeon: Sherren Kerns, MD;  Location: [facility] OR;  Service: Vascular;  Laterality: Left;   TUBAL LIGATION      Current Outpatient Medications  Medication Sig Dispense Refill   amLODipine (NORVASC) 10 MG tablet Take 1 tablet (10 mg total) by mouth daily. 30 tablet 1   atorvastatin (LIPITOR) 80 MG tablet Take 1 tablet (80 mg total) by mouth daily at 6 PM. (Patient taking differently: Take 80 mg by mouth at bedtime.) 30 tablet 6   cloNIDine (CATAPRES) 0.2 MG tablet Take 0.2 mg by mouth 2 (two) times daily.     Continuous Blood Gluc Receiver (DEXCOM G6 RECEIVER) DEVI USE TO CHECK GLUCOSE FOUR TIMES DAILY AS DIRECTED. (Patient not taking: Reported on 01/16/2023) 1 each 0   FLUoxetine (PROZAC) 10 MG tablet Take 10 mg by mouth daily. Take with 20 mg for a total of 30 mg daily     FLUoxetine (PROZAC) 20 MG capsule Take 20 mg by mouth daily. Take with 10 mg for a total of 30 mg daily      furosemide (LASIX) 80 MG tablet Take 80 mg by mouth 2 (two) times daily.     gabapentin (NEURONTIN) 300 MG capsule TAKE (1) CAPSULE BY MOUTH DAILY. (Patient taking differently: Take 300 mg by mouth at bedtime.) 30 capsule 0   hydrALAZINE (APRESOLINE) 50 MG tablet Take 50 mg by mouth 2 (two) times daily.     hydrOXYzine (ATARAX) 25 MG tablet Take 25 mg by mouth 3 (three) times daily as needed for itching.     insulin degludec (TRESIBA FLEXTOUCH) 100 UNIT/ML FlexTouch Pen Inject 40 Units into the skin at bedtime. 15 mL 2   insulin lispro (HUMALOG KWIKPEN) 100 UNIT/ML KwikPen Inject 6-12 Units into the skin 3 (three) times daily. 30 mL 3   Lancets (ONETOUCH DELICA PLUS LANCET33G) MISC Apply topically.  linaclotide (LINZESS) 290 MCG CAPS capsule Take 1 capsule (290 mcg total) by mouth daily before breakfast. 90 capsule 3   omeprazole (PRILOSEC) 20 MG capsule Take 20 mg by mouth daily before breakfast.      ONETOUCH VERIO test strip SMARTSIG:Via Meter     Semaglutide, 2 MG/DOSE, (OZEMPIC, 2 MG/DOSE,) 8 MG/3ML SOPN Inject 2 mg into the skin once a week. 3 mL 3   traZODone (DESYREL) 100 MG tablet Take 100 mg by mouth at bedtime.     No current facility-administered medications for this visit.    Allergies as of 03/22/2023 - Review Complete 01/16/2023  Allergen Reaction Noted   Contrast media [iodinated contrast media] Anaphylaxis 01/04/2017   Penicillins Rash and Other (See Comments) 04/18/2011    Family History  Problem Relation Age of Onset   Diabetes Mother    Kidney failure Mother    Diabetes Father    Amblyopia Neg Hx    Blindness Neg Hx    Cataracts Neg Hx    Glaucoma Neg Hx    Macular degeneration Neg Hx    Retinal detachment Neg Hx    Strabismus Neg Hx    Retinitis pigmentosa Neg Hx     Social History   Socioeconomic History   Marital status: Single    Spouse name: Not on file   Number of children: Not on file   Years of education: Not on file   Highest education  level: Not on file  Occupational History   Not on file  Tobacco Use   Smoking status: Former    Current packs/day: 0.00    Average packs/day: (1.2 ttl pk-yrs)    Types: Cigarettes    Start date: 06/05/1981    Quit date: 06/2022    Years since quitting: 0.7   Smokeless tobacco: Never   Tobacco comments:    Quit in 12/2018 but now smokes off and on per patient 05/04/21.  Vaping Use   Vaping status: Never Used  Substance and Sexual Activity   Alcohol use: Yes    Comment: occ wine   Drug use: No   Sexual activity: Yes    Birth control/protection: Surgical    Comment: Tubal Ligation  Other Topics Concern   Not on file  Social History Narrative   Not on file   Social Determinants of Health   Financial Resource Strain: Not on file  Food Insecurity: Not on file  Transportation Needs: Not on file  Physical Activity: Not on file  Stress: Not on file  Social Connections: Not on file    Review of Systems: Gen: Denies fever, chills, anorexia. Denies fatigue, weakness, weight loss.  CV: Denies chest pain, palpitations, syncope, peripheral edema, and claudication. Resp: Denies dyspnea at rest, cough, wheezing, coughing up blood, and pleurisy. GI: Denies vomiting blood, jaundice, and fecal incontinence.   Denies dysphagia or odynophagia. Derm: Denies rash, itching, dry skin Psych: Denies depression, anxiety, memory loss, confusion. No homicidal or suicidal ideation.  Heme: Denies bruising, bleeding, and enlarged lymph nodes.  Physical Exam: LMP  (LMP Unknown)  General:   Alert and oriented. No distress noted. Pleasant and cooperative.  Head:  Normocephalic and atraumatic. Eyes:  Conjuctiva clear without scleral icterus. Heart:  S1, S2 present without murmurs appreciated. Lungs:  Clear to auscultation bilaterally. No wheezes, rales, or rhonchi. No distress.  Abdomen:  +BS, soft, non-tender and non-distended. No rebound or guarding. No HSM or masses noted. Msk:  Symmetrical without  gross deformities. Normal posture.  Extremities:  Without edema. Neurologic:  Alert and  oriented x4 Psych:  Normal mood and affect.    Assessment:     Plan:  ***   Ermalinda Memos, PA-C [facility] 03/22/2023

## 2023-03-22 ENCOUNTER — Ambulatory Visit (INDEPENDENT_AMBULATORY_CARE_PROVIDER_SITE_OTHER): Payer: 59 | Admitting: Gastroenterology

## 2023-03-22 ENCOUNTER — Encounter: Payer: Self-pay | Admitting: Gastroenterology

## 2023-03-22 VITALS — BP 138/81 | HR 91 | Temp 97.6°F | Ht 64.0 in | Wt 221.4 lb

## 2023-03-22 DIAGNOSIS — K5909 Other constipation: Secondary | ICD-10-CM | POA: Diagnosis not present

## 2023-03-22 DIAGNOSIS — K5904 Chronic idiopathic constipation: Secondary | ICD-10-CM

## 2023-03-22 NOTE — Patient Instructions (Addendum)
Please start taking Linzess 290 mcg every day to ensure that you are having good productive bowel movements on a daily basis.   I will plan to see you back in the office in 4-6 weeks to see if your constipation is well-controlled.  If so, we will plan to get your colonoscopy rescheduled.  It was good to meet you today!  Ermalinda Memos, PA-C [facility]

## 2023-03-26 ENCOUNTER — Telehealth: Payer: Self-pay | Admitting: Gastroenterology

## 2023-03-26 NOTE — Telephone Encounter (Signed)
Left patient a message to schedule a 4-6 week follow up visit with Samantha Pierce after being seen 03/22/23

## 2023-04-18 ENCOUNTER — Other Ambulatory Visit: Payer: Self-pay | Admitting: Nurse Practitioner

## 2023-04-19 ENCOUNTER — Ambulatory Visit: Payer: 59 | Admitting: Nurse Practitioner

## 2023-06-09 ENCOUNTER — Other Ambulatory Visit: Payer: Self-pay | Admitting: Nurse Practitioner

## 2023-06-25 ENCOUNTER — Emergency Department (HOSPITAL_COMMUNITY)
Admission: EM | Admit: 2023-06-25 | Discharge: 2023-06-25 | Disposition: A | Discharge status: Home / Self Care | Payer: 59 | Attending: Emergency Medicine | Admitting: Emergency Medicine

## 2023-06-25 ENCOUNTER — Emergency Department (HOSPITAL_COMMUNITY): Payer: 59

## 2023-06-25 ENCOUNTER — Other Ambulatory Visit: Payer: Self-pay

## 2023-06-25 ENCOUNTER — Encounter (HOSPITAL_COMMUNITY): Payer: Self-pay

## 2023-06-25 DIAGNOSIS — N83202 Unspecified ovarian cyst, left side: Secondary | ICD-10-CM | POA: Insufficient documentation

## 2023-06-25 DIAGNOSIS — K5900 Constipation, unspecified: Secondary | ICD-10-CM | POA: Diagnosis present

## 2023-06-25 DIAGNOSIS — K8020 Calculus of gallbladder without cholecystitis without obstruction: Secondary | ICD-10-CM | POA: Insufficient documentation

## 2023-06-25 DIAGNOSIS — N186 End stage renal disease: Secondary | ICD-10-CM | POA: Diagnosis not present

## 2023-06-25 DIAGNOSIS — E1122 Type 2 diabetes mellitus with diabetic chronic kidney disease: Secondary | ICD-10-CM | POA: Diagnosis not present

## 2023-06-25 DIAGNOSIS — Z794 Long term (current) use of insulin: Secondary | ICD-10-CM | POA: Insufficient documentation

## 2023-06-25 DIAGNOSIS — K439 Ventral hernia without obstruction or gangrene: Secondary | ICD-10-CM | POA: Diagnosis not present

## 2023-06-25 DIAGNOSIS — Z8673 Personal history of transient ischemic attack (TIA), and cerebral infarction without residual deficits: Secondary | ICD-10-CM | POA: Diagnosis not present

## 2023-06-25 DIAGNOSIS — Z992 Dependence on renal dialysis: Secondary | ICD-10-CM | POA: Diagnosis not present

## 2023-06-25 LAB — CBC WITH DIFFERENTIAL/PLATELET
Abs Immature Granulocytes: 0.05 10*3/uL (ref 0.00–0.07)
Basophils Absolute: 0.1 10*3/uL (ref 0.0–0.1)
Basophils Relative: 1 %
Eosinophils Absolute: 0.6 10*3/uL — ABNORMAL HIGH (ref 0.0–0.5)
Eosinophils Relative: 8 %
HCT: 41.1 % (ref 36.0–46.0)
Hemoglobin: 12.9 g/dL (ref 12.0–15.0)
Immature Granulocytes: 1 %
Lymphocytes Relative: 27 %
Lymphs Abs: 1.9 10*3/uL (ref 0.7–4.0)
MCH: 26.8 pg (ref 26.0–34.0)
MCHC: 31.4 g/dL (ref 30.0–36.0)
MCV: 85.4 fL (ref 80.0–100.0)
Monocytes Absolute: 0.7 10*3/uL (ref 0.1–1.0)
Monocytes Relative: 10 %
Neutro Abs: 3.9 10*3/uL (ref 1.7–7.7)
Neutrophils Relative %: 53 %
Platelets: 209 10*3/uL (ref 150–400)
RBC: 4.81 MIL/uL (ref 3.87–5.11)
RDW: 13.2 % (ref 11.5–15.5)
WBC: 7.3 10*3/uL (ref 4.0–10.5)
nRBC: 0 % (ref 0.0–0.2)

## 2023-06-25 LAB — COMPREHENSIVE METABOLIC PANEL
ALT: 19 U/L (ref 0–44)
AST: 20 U/L (ref 15–41)
Albumin: 3.8 g/dL (ref 3.5–5.0)
Alkaline Phosphatase: 48 U/L (ref 38–126)
Anion gap: 12 (ref 5–15)
BUN: 24 mg/dL — ABNORMAL HIGH (ref 6–20)
CO2: 30 mmol/L (ref 22–32)
Calcium: 9 mg/dL (ref 8.9–10.3)
Chloride: 94 mmol/L — ABNORMAL LOW (ref 98–111)
Creatinine, Ser: 4.24 mg/dL — ABNORMAL HIGH (ref 0.44–1.00)
GFR, Estimated: 12 mL/min — ABNORMAL LOW (ref >60)
Glucose, Bld: 189 mg/dL — ABNORMAL HIGH (ref 70–99)
Potassium: 3.8 mmol/L (ref 3.5–5.1)
Sodium: 136 mmol/L (ref 135–145)
Total Bilirubin: 0.6 mg/dL (ref 0.0–1.2)
Total Protein: 7.6 g/dL (ref 6.5–8.1)

## 2023-06-25 LAB — CBG MONITORING, ED: Glucose-Capillary: 184 mg/dL — ABNORMAL HIGH (ref 70–99)

## 2023-06-25 LAB — LIPASE, BLOOD: Lipase: 39 U/L (ref 11–51)

## 2023-06-25 MED ORDER — BISACODYL 10 MG RE SUPP: 10.0000 mg | RECTAL | 0 refills | Status: DC | PRN

## 2023-06-25 MED ORDER — POLYETHYLENE GLYCOL 3350 17 G PO PACK: 17.0000 g | PACK | Freq: Every day | ORAL | 0 refills | Status: AC

## 2023-06-25 MED ORDER — BISACODYL 10 MG RE SUPP
10.0000 mg | Freq: Once | RECTAL | Status: AC
  Administered 2023-06-25: 10 mg via RECTAL
  Filled 2023-06-25: qty 1

## 2023-06-25 NOTE — ED Notes (Signed)
Pt is unable to urinate at this time, pt states she doesn't urinate often due to being on dialysis.

## 2023-06-25 NOTE — ED Provider Notes (Signed)
  Physical Exam  BP 135/77   Pulse 87   Temp 98.4 F (36.9 C) (Oral)   Resp 19   Ht 5\' 4"  (1.626 m)   Wt 102 kg   LMP  (LMP Unknown)   SpO2 99%   BMI 38.60 kg/m   Physical Exam  Procedures  Procedures  ED Course / MDM   Clinical Course as of 06/25/23 1630  Mon Jun 25, 2023  1513 Assumed care from Dr Criss Alvine. 53 yo F hx of chronic constipation, ESRD on iHD, DM2, and stroke who presents with abdominal pain and constipation. Labs unremarkable. Awaiting CT scan.  [RP]  1629 CT scan shows constipation without bowel obstruction.  It also showed a left-sided ovarian cyst that the patient was notified of which will need repeat imaging.  Also showed gallstones and says that she has had intermittent right upper quadrant pain for months.  No significant tenderness palpation there now and has negative Murphy sign.  She also has not had any fevers or vomiting.  Not having any pain where the ventral hernia was noted on her CT scan.  Will have her follow-up with her primary doctor and general surgery. [RP]    Clinical Course User Index [RP] Rondel Baton, MD   Medical Decision Making Amount and/or Complexity of Data Reviewed Labs: ordered. Radiology: ordered.  Risk OTC drugs.     Rondel Baton, MD 06/25/23 1630

## 2023-06-25 NOTE — Discharge Instructions (Signed)
You were seen for your constipation in the emergency department.   At home, please take your linzess, miralax, and the ducolax suppositories as needed for your constipation.    Check your MyChart online for the results of any tests that had not resulted by the time you left the emergency department.   Follow-up with your primary doctor in 2-3 days regarding your visit.  Talk to them about additional imaging for your ovarian cyst.  Follow-up with general surgery about your abdominal pain and gallstones.  Return immediately to the emergency department if you experience any of the following: Worsening pain, vomiting, fever, or any other concerning symptoms.    Thank you for visiting our Emergency Department. It was a pleasure taking care of you today.

## 2023-06-25 NOTE — ED Provider Notes (Signed)
[place] EMERGENCY DEPARTMENT AT [facility] Provider Note   CSN: [id number] Arrival date & time: 06/25/23  1225     History  Chief Complaint  Patient presents with   Constipation    Samantha Pierce is a 53 y.o. female.  HPI 53 year old female with a history of chronic constipation, ESRD on dialysis, type 2 diabetes, stroke, and other comorbidities presents with constipation and abdominal pain.  She has chronic constipation and does use Linzess.  Since November she has been using it every day as opposed to every other day.  She still goes to the bathroom about once a week with this.  However over the last 2 weeks she has had less output and seems to be more liquidy.  She is also having diffuse abdominal pain, bloating, gas.  No vomiting.  She is also having right low back pain.  She urinates up to 4 times per day but has not noticed any dysuria.  She does not have any rectal pain.  She states she has had an impaction before and it does not feel like that.  Home Medications Prior to Admission medications   Medication Sig Start Date End Date Taking? Authorizing Provider  amLODipine (NORVASC) 10 MG tablet Take 1 tablet (10 mg total) by mouth daily. 01/21/19   Catarina Hartshorn, MD  atorvastatin (LIPITOR) 80 MG tablet Take 1 tablet (80 mg total) by mouth daily at 6 PM. Patient taking differently: Take 80 mg by mouth at bedtime. 08/05/15   Jonelle Sidle, MD  cloNIDine (CATAPRES) 0.2 MG tablet Take 0.2 mg by mouth 2 (two) times daily.    [provider]  Continuous Blood Gluc Receiver (DEXCOM G6 RECEIVER) DEVI USE TO CHECK GLUCOSE FOUR TIMES DAILY AS DIRECTED. 10/08/20   Dani Gobble, NP  FLUoxetine (PROZAC) 10 MG tablet Take 10 mg by mouth daily. Take with 20 mg for a total of 30 mg daily 12/15/20   [provider]  FLUoxetine (PROZAC) 20 MG capsule Take 20 mg by mouth daily. Take with 10 mg for a total of 30 mg daily 12/15/20   [provider]   furosemide (LASIX) 80 MG tablet Take 80 mg by mouth 2 (two) times daily. 09/25/19   [provider]  gabapentin (NEURONTIN) 300 MG capsule TAKE (1) CAPSULE BY MOUTH DAILY. Patient taking differently: Take 300 mg by mouth at bedtime. 07/04/22   Maeola Harman, MD  hydrALAZINE (APRESOLINE) 50 MG tablet Take 50 mg by mouth 2 (two) times daily. 12/16/20   [provider]  hydrOXYzine (ATARAX) 25 MG tablet Take 25 mg by mouth 3 (three) times daily as needed for itching. 07/12/21   [provider]  insulin lispro (HUMALOG KWIKPEN) 100 UNIT/ML KwikPen Inject 6-12 Units into the skin 3 (three) times daily. 09/07/22   Dani Gobble, NP  Lancets [facility] DELICA PLUS [place]) MISC Apply topically. 12/15/20   [provider]  linaclotide Karlene Einstein) 290 MCG CAPS capsule Take 1 capsule (290 mcg total) by mouth daily before breakfast. 07/27/22 07/27/23  Lanelle Bal, DO  omeprazole (PRILOSEC) 20 MG capsule Take 20 mg by mouth daily before breakfast.  02/07/18   [provider]  [facility] VERIO test strip SMARTSIG:Via Meter 12/15/20   [provider]  Semaglutide, 2 MG/DOSE, (OZEMPIC, 2 MG/DOSE,) 8 MG/3ML SOPN INJECT 2 MG ONCE A WEEK AS DIRECTED 06/12/23   Dani Gobble, NP  traZODone (DESYREL) 100 MG tablet Take 100 mg by mouth  at bedtime.    [provider]  TRESIBA FLEXTOUCH 100 UNIT/ML FlexTouch Pen INJECT 40 UNITS INTO THE SKIN AT BEDTIME. 04/18/23   Dani Gobble, NP      Allergies    Contrast media [iodinated contrast media] and Penicillins    Review of Systems   Review of Systems  Gastrointestinal:  Positive for abdominal pain and constipation. Negative for vomiting.  Genitourinary:  Negative for dysuria.  Musculoskeletal:  Positive for back pain.    Physical Exam Updated Vital Signs BP 135/77   Pulse 87   Temp 98.4 F (36.9 C) (Oral)   Resp 19   Ht 5\' 4"  (1.626 m)   Wt 102 kg   LMP  (LMP Unknown)   SpO2 99%    BMI 38.60 kg/m  Physical Exam Vitals and nursing note reviewed.  Constitutional:      General: She is not in acute distress.    Appearance: She is well-developed. She is not ill-appearing or diaphoretic.  HENT:     Head: Normocephalic and atraumatic.  Cardiovascular:     Rate and Rhythm: Normal rate and regular rhythm.     Heart sounds: Normal heart sounds.  Pulmonary:     Effort: Pulmonary effort is normal.     Breath sounds: Normal breath sounds.  Abdominal:     Palpations: Abdomen is soft.     Tenderness: There is generalized abdominal tenderness.     Comments: Most of the tenderness is in the lower abdomen  Musculoskeletal:     Comments: No reproducible lumbar back tenderness  Skin:    General: Skin is warm and dry.  Neurological:     Mental Status: She is alert.     ED Results / Procedures / Treatments   Labs (all labs ordered are listed, but only abnormal results are displayed) Labs Reviewed  CBG MONITORING, ED - Abnormal; Notable for the following components:      Result Value   Glucose-Capillary 184 (*)    All other components within normal limits  COMPREHENSIVE METABOLIC PANEL  LIPASE, BLOOD  CBC WITH DIFFERENTIAL/PLATELET  URINALYSIS, ROUTINE W REFLEX MICROSCOPIC    EKG None  Radiology No results found.  Procedures Procedures    Medications Ordered in ED Medications - No data to display  ED Course/ Medical Decision Making/ A&P                                Medical Decision Making Amount and/or Complexity of Data Reviewed Labs: ordered. Radiology: ordered.   Patient has diffuse but nonspecific abdominal tenderness.  She is currently waiting on a CT.  Overall is well-appearing.  Care transferred to Dr. Eloise Harman.        Final Clinical Impression(s) / ED Diagnoses Final diagnoses:  None    Rx / DC Orders ED Discharge Orders     None         Pricilla Loveless, MD 06/25/23 1544

## 2023-06-25 NOTE — ED Triage Notes (Signed)
Pt arrived via POV from home c/o constipation X 2 weeks. Pt reports her dialysis is M/W/F, she did receive full dialysis this morning and her doctor told her to come to [facility] for evaluation of on-going constipation. Pt reports she has Hx of constipation, has tried OTC Laxatives w/o relief.

## 2023-07-03 ENCOUNTER — Other Ambulatory Visit: Payer: Self-pay | Admitting: Obstetrics & Gynecology

## 2023-07-03 DIAGNOSIS — N9489 Other specified conditions associated with female genital organs and menstrual cycle: Secondary | ICD-10-CM

## 2023-07-03 NOTE — Progress Notes (Signed)
Order for [place]

## 2023-07-11 ENCOUNTER — Ambulatory Visit (INDEPENDENT_AMBULATORY_CARE_PROVIDER_SITE_OTHER): Payer: 59 | Admitting: Obstetrics & Gynecology

## 2023-07-11 ENCOUNTER — Ambulatory Visit: Payer: 59

## 2023-07-11 ENCOUNTER — Encounter: Payer: Self-pay | Admitting: Obstetrics & Gynecology

## 2023-07-11 ENCOUNTER — Other Ambulatory Visit (HOSPITAL_COMMUNITY)
Admission: RE | Admit: 2023-07-11 | Discharge: 2023-07-11 | Disposition: A | Discharge status: Home / Self Care | Payer: 59 | Source: Ambulatory Visit | Attending: Obstetrics & Gynecology | Admitting: Obstetrics & Gynecology

## 2023-07-11 VITALS — BP 152/74 | HR 89 | Wt 224.8 lb

## 2023-07-11 DIAGNOSIS — Z01419 Encounter for gynecological examination (general) (routine) without abnormal findings: Secondary | ICD-10-CM | POA: Diagnosis present

## 2023-07-11 DIAGNOSIS — Z992 Dependence on renal dialysis: Secondary | ICD-10-CM

## 2023-07-11 DIAGNOSIS — I10 Essential (primary) hypertension: Secondary | ICD-10-CM | POA: Diagnosis not present

## 2023-07-11 DIAGNOSIS — N83202 Unspecified ovarian cyst, left side: Secondary | ICD-10-CM | POA: Diagnosis not present

## 2023-07-11 DIAGNOSIS — N184 Chronic kidney disease, stage 4 (severe): Secondary | ICD-10-CM

## 2023-07-11 DIAGNOSIS — Z124 Encounter for screening for malignant neoplasm of cervix: Secondary | ICD-10-CM

## 2023-07-11 DIAGNOSIS — E1165 Type 2 diabetes mellitus with hyperglycemia: Secondary | ICD-10-CM

## 2023-07-11 DIAGNOSIS — N9489 Other specified conditions associated with female genital organs and menstrual cycle: Secondary | ICD-10-CM

## 2023-07-11 DIAGNOSIS — Z8673 Personal history of transient ischemic attack (TIA), and cerebral infarction without residual deficits: Secondary | ICD-10-CM

## 2023-07-11 DIAGNOSIS — Z1151 Encounter for screening for human papillomavirus (HPV): Secondary | ICD-10-CM | POA: Diagnosis not present

## 2023-07-11 DIAGNOSIS — Z794 Long term (current) use of insulin: Secondary | ICD-10-CM

## 2023-07-11 NOTE — Progress Notes (Signed)
 GYN VISIT Patient name: Samantha Pierce MRN [id number]  Date of birth: 13-Oct-1970 Chief Complaint:   Follow-up (U/s today)  History of Present Illness:   Samantha Pierce is a 53 y.o. [phone] PM female being seen today for the following concerns:  -Adnexal mass: Patient seen in the ED on 06-25-2023 where a CT was performed.  At that time it revealed a 5.9 x 5.1 x 4.5 cm cystic mass with mural thickening and nodularity on right.  She had presented to ED due to abdominal pain and constipation x 2 weeks  Still struggling with constipation- taking Linzess  and other OTC management.  Last BM was Sunday.  Still having some abdominal pain, mostly on the right side.  Rates her pain about a 5/10.  Not taking any medication for the pain.  +bloating  Currently on dialysis- on list for transplant  Denies vaginal, bleeidng, discharge or irritation.  Denies sexually activity.     No LMP recorded (lmp unknown). Patient is postmenopausal.    Review of Systems:   Pertinent items are noted in HPI Denies fever/chills, dizziness, headaches, visual disturbances, fatigue, shortness of breath, chest pain Pertinent History Reviewed:   Past Surgical History:  Procedure Laterality Date   AV FISTULA PLACEMENT Right 01/08/2017   Procedure: RIGHT ARM BRACHIOCEPHALIC ARTERIOVENOUS (AV) FISTULA CREATION;  Surgeon: Sheree Penne Bruckner, MD;  Location: [facility] OR;  Service: Vascular;  Laterality: Right;   AV FISTULA PLACEMENT Left 11/20/2018   Procedure: Creation of LEFT ARM ARTERIOVENOUS (AV) FISTULA;  Surgeon: Sheree Penne Bruckner, MD;  Location: [facility] OR;  Service: Vascular;  Laterality: Left;   AV FISTULA PLACEMENT Right 02/24/2021   Procedure: RIGHT UPPER ARM BASILIC VEIN ARTERIOVENOUS (AV) FISTULA CREATION;  Surgeon: Magda Debby SAILOR, MD;  Location: [facility] OR;  Service: Vascular;  Laterality: Right;   AV FISTULA PLACEMENT Right 05/05/2021   Procedure: INSERTION OF ARTERIOVENOUS (AV) GORE-TEX GRAFT ARM;   Surgeon: Magda Debby SAILOR, MD;  Location: [facility] OR;  Service: Vascular;  Laterality: Right;   BASCILIC VEIN TRANSPOSITION Left 02/11/2019   Procedure: BASILIC VEIN TRANSPOSITION SECOND STAGE;  Surgeon: Sheree Penne Bruckner, MD;  Location: [facility] OR;  Service: Vascular;  Laterality: Left;   CARDIAC CATHETERIZATION N/A 06/01/2015   Procedure: Left Heart Cath and Coronary Angiography;  Surgeon: Victory LELON Sharps, MD; CFX 30%, no other dz, EF nl by echo   CESAREAN SECTION     x2   COLONOSCOPY WITH PROPOFOL  N/A 07/27/2022   Procedure: COLONOSCOPY WITH PROPOFOL ;  Surgeon: Cindie Carlin POUR, DO;  Location: [facility]ITE;  Service: Endoscopy;  Laterality: N/A;  930am, asa 3, dialysis pt   DILATION AND CURETTAGE OF UTERUS     EYE SURGERY     Right eye pars plano vitrectomy    HEMATOMA EVACUATION Left 03/03/2019   Procedure: EVACUATION HEMATOMA;  Surgeon: Harvey Carlin BRAVO, MD;  Location: [facility] OR;  Service: Vascular;  Laterality: Left;   INSERTION OF DIALYSIS CATHETER Right 01/04/2021   Procedure: INSERTION OF RIGHT INTERNAL JUGULAR TUNNELED DIALYSIS CATHETER;  Surgeon: Harvey Carlin BRAVO, MD;  Location: [facility] OR;  Service: Vascular;  Laterality: Right;   LASIK     LIGATION OF ARTERIOVENOUS  FISTULA Left 01/04/2021   Procedure: LIGATION OF LEFT ARM ARTERIOVENOUS  FISTULA;  Surgeon: Harvey Carlin BRAVO, MD;  Location: [facility] OR;  Service: Vascular;  Laterality: Left;   PARS PLANA VITRECTOMY  04/20/2011   Procedure: PARS PLANA VITRECTOMY WITH 25 GAUGE;  Surgeon: Norleen JONETTA Ku, MD;  Location: [facility] OR;  Service: Ophthalmology;  Laterality: Left;  membrane peel, gas fluid exchange, endolaser, repair of complex retinal detachment   PATCH ANGIOPLASTY Left 10/05/2020   Procedure: PATCH ANGIOPLASTY USING GEORGE BIOLOGIC PATCH;  Surgeon: Harvey Carlin BRAVO, MD;  Location: [facility] OR;  Service: Vascular;  Laterality: Left;   PERIPHERAL VASCULAR BALLOON ANGIOPLASTY Left 09/02/2020   Procedure: PERIPHERAL VASCULAR BALLOON ANGIOPLASTY;  Surgeon:  Gretta Lonni PARAS, MD;  Location: [facility] INVASIVE CV LAB;  Service: Cardiovascular;  Laterality: Left;  arm fistula   REMOVAL OF A DIALYSIS CATHETER N/A 06/21/2021   Procedure: MINOR REMOVAL OF TUNNELED DIALYSIS CATHETER;  Surgeon: Oris Krystal FALCON, MD;  Location: [facility] ORS;  Service: Vascular;  Laterality: N/A;   REVISON OF ARTERIOVENOUS FISTULA Left 10/05/2020   Procedure: REVISION OF LEFT ARM ARTERIOVENOUS FISTULA;  Surgeon: Harvey Carlin BRAVO, MD;  Location: [facility] OR;  Service: Vascular;  Laterality: Left;   TUBAL LIGATION      Past Medical History:  Diagnosis Date   Anemia    Anxiety    Asystole (HCC)    a. 05/2015: pt developed bradycardia->asystole during Lexiscan  nuclear stress test, s/p brief code. Cath with only 30% prox LCx. Her asystole during Lexiscan  infusion was felt to represent an excessive pharmacologic response to the agent and likely superimposed vasovagal response.   Bipolar disorder (HCC)    CKD (chronic kidney disease) stage 4, GFR 15-29 ml/min (HCC)    M_W_F dialysis   COPD (chronic obstructive pulmonary disease) (HCC)    Depression    Essential hypertension    GERD (gastroesophageal reflux disease)    History of blood transfusion 2011   History of seizure 2017   only one seizure - none since   History of stroke 09/2014   Acute lacunar infarct of the left thalamus   Leg cramps    Mild CAD    a. LHC 05/2015: 30% prox Cx, otherwise widely patent.   Morbid obesity (HCC)    Neuromuscular disorder (HCC)    neuropathy in feet   Neuropathy    feet   PTSD (post-traumatic stress disorder)    Stroke (HCC)    x 2, right side weakness   Type 2 diabetes mellitus (HCC)    Reviewed problem list, medications and allergies. Physical Assessment:   Vitals:   07/11/23 1444  BP: (!) 152/74  Pulse: 89  Weight: 224 lb 12.8 oz (102 kg)  Body mass index is 38.59 kg/m.       Physical Examination:   General appearance: alert, well appearing, and in no distress  Psych: mood  appropriate, normal affect  Skin: warm & dry   Cardiovascular: normal heart rate noted  Respiratory: normal respiratory effort, no distress  Abdomen: soft, non-tender, fullness appreciated RLQ with tenderness to palpation  Pelvic: VULVA: normal appearing vulva with no masses, tenderness or lesions, VAGINA: normal appearing vagina with normal color and discharge, no lesions, CERVIX: difficult to see- anterior location- retroflexed UTERUS: uterus is normal size, shape, consistency and nontender.  On bimanual exam- non-tender, no mass appreciated  Extremities: no edema   Chaperone: Alan Fischer    Pelvic US : heterogeneous ante/retroflexed uterus with multiple fibroids,(#1) posterior subserosal fibroid 4.4 x 3.7 x 4.8 cm,(#2) anterior subserosal 2.5 x 1.7 x 2.1 cm,(#3) anterior subserosal 2.7 x 2 x 2.1 cm,EEC 4.1 mm,normal right ovary,vascular complex left ovarian cyst with a septation 4.8 x 4.4 x 3.6 cm  and a mural nodule 1.8 x 1.2 x 1.4 cm ,no free fluid,no  pain during ultrasound   Assessment & Plan:  1) Left adnexal mass -reviewed today's US  findings -complex left ovarian cyst- concern for possible neoplasm -recommendation for ovarian tumor markers -further management pending results. -reviewed that surgical intervention would be recommended, but may consider referral to gyn/oncology pending results -questions/concerns were addressed -may also consider MRI for further evaluation of cyst  -discussed that typically I perform surgery @ [facility] however due to co-morbidities will need medical clearance and to confirm appropriate location for surgery  2) Preventive screening -pap collected, reviewed screening guidelines -mammogram up to date  3) Medical co-morbidities include T2DM with ESRD- currently on dialysis, h/o stroke, cHTN  Orders Placed This Encounter  Procedures   Ovarian Malignancy Risk-ROMA   Cancer antigen 19-9   Ovarian Cancer Monitor   CEA    Return for  TBD.   Keara Pagliarulo, DO Attending Obstetrician & Gynecologist, [facility] for [employer], [facility]

## 2023-07-11 NOTE — Progress Notes (Addendum)
 PELVIC US  TA/TV: heterogeneous ante/retroflexed uterus with multiple fibroids,(#1) posterior subserosal fibroid 4.4 x 3.7 x 4.8 cm,(#2) anterior subserosal 2.5 x 1.7 x 2.1 cm,(#3) anterior subserosal 2.7 x 2 x 2.1 cm,EEC 4.1 mm,normal right ovary,vascular complex left ovarian cyst with a septation 4.8 x 4.4 x 3.6 cm  and a mural nodule 1.8 x 1.2 x 1.4 cm ,no free fluid,no pain during ultrasound

## 2023-07-12 ENCOUNTER — Encounter: Payer: 59 | Admitting: Obstetrics & Gynecology

## 2023-07-12 ENCOUNTER — Other Ambulatory Visit: Payer: Self-pay | Admitting: Obstetrics & Gynecology

## 2023-07-12 DIAGNOSIS — N83202 Unspecified ovarian cyst, left side: Secondary | ICD-10-CM

## 2023-07-12 LAB — OVARIAN MALIGNANCY RISK-ROMA
Cancer Antigen (CA) 125: 6 U/mL (ref 0.0–38.1)
HE4: 670 pmol/L — ABNORMAL HIGH (ref 0.0–105.2)
Postmenopausal ROMA: 4.97 — ABNORMAL HIGH
Premenopausal ROMA: 9.73 — ABNORMAL HIGH

## 2023-07-12 LAB — CEA: CEA: 2.3 ng/mL (ref 0.0–4.7)

## 2023-07-12 LAB — CANCER ANTIGEN 19-9: CA 19-9: 9 U/mL (ref 0–35)

## 2023-07-12 LAB — POSTMENOPAUSAL INTERP: HIGH

## 2023-07-12 LAB — PREMENOPAUSAL INTERP: HIGH

## 2023-07-12 NOTE — Progress Notes (Signed)
Referral to gyn-oncology  

## 2023-07-18 LAB — CYTOLOGY - PAP
Adequacy: ABSENT
Chlamydia: NEGATIVE
Comment: NEGATIVE
Comment: NEGATIVE
Comment: NORMAL
Diagnosis: NEGATIVE
High risk HPV: NEGATIVE
Neisseria Gonorrhea: NEGATIVE

## 2023-07-19 ENCOUNTER — Telehealth: Payer: Self-pay | Admitting: Obstetrics & Gynecology

## 2023-07-19 NOTE — Telephone Encounter (Signed)
Patient called stating that Oncology still has not called her. I did give her the phone number earlier in the week and she did call them. They explained that they are waiting for the doctor to look at her information and that would call her back. She states that she feels like we gave her DX's and dropped the ball. That we didn't offer anyone for her to talk too. She would like to know if we can put a referral in for concealing.

## 2023-07-20 ENCOUNTER — Telehealth: Payer: Self-pay

## 2023-07-20 NOTE — Telephone Encounter (Signed)
Spoke with the patient regarding the referral to GYN oncology. Patient scheduled as new patient with Dr Pricilla Holm on 07/27/2023. Patient given an arrival time of 9:45am.  Explained to the patient the the doctor will perform a pelvic exam at this visit. Patient given the policy that only one visitor allowed and that visitor must be over 16 yrs are allowed in the Cancer Center. Patient given the address/phone number for the clinic and that the center offers free valet service. Patient aware that masks required.

## 2023-07-26 ENCOUNTER — Ambulatory Visit: Payer: 59 | Admitting: Nurse Practitioner

## 2023-07-26 NOTE — Progress Notes (Unsigned)
GYNECOLOGIC ONCOLOGY NEW PATIENT CONSULTATION   Patient Name: Samantha Pierce  Patient Age: 53 y.o. Date of Service: 07/27/23 Referring Provider: Dr. Katha Hamming  Primary Care Provider: Estevan Oaks, NP Consulting Provider: Eugene Garnet, MD   Assessment/Plan:  Perimenopausal patient with complex adnexal mass.  Significant comorbidities include history of stroke, insulin-dependent type 2 diabetes, and end-stage renal disease on dialysis.  I spent some time reviewing with the patient findings on her recent CT scan and ultrasound.  We looked at CT images together.  The mass overall looks cystic although there is some complexity to it, better appreciated on ultrasound.  We also discussed her recent tumor markers.  While HE4 is a tumor marker for ovarian cancer, it has also been observed to be elevated in benign conditions including chronic kidney disease.  Some studies have shown higher levels of HE4 patients with CKD compared to ovarian cancer and increased HE4 with worsening renal function. Given her CKD, I do not know that this is a reliable marker for her (and her resulting high risk ROMA).  We discussed her normal CA125.  I stressed that similarly this is not a diagnostic test.  This tumor marker can be normal and up to 50% of patients with early stage ovarian cancer and abnormal in other benign disease processes.  While imaging is not diagnostic of malignancy, given features of her adnexal mass, I would recommend that we proceed with surgery for diagnostic purposes.  In the setting of her multiple comorbidities, we discussed needing clearance from nephrology, cardiology, neurology, and her primary care provider.  My office will reach out to anesthesia about decision to do surgery here versus at [facility].  We will likely plan on at least an overnight stay.  If the surgery is at [facility], we may be able to do the surgery here on the day after dialysis with plan to discharge her the next  morning to go to outpatient dialysis.  Otherwise, she would require transfer to [facility] for dialysis inpatient.  We discussed the importance of glycemic control around the time of surgery given increased perioperative morbidity as well as increased risk of infection.  Goal would be for her blood sugars to be under 1 80-200 in the perioperative period.  Given hot flashes for the last year as well as some abnormal uterine bleeding, I suspect that she is perimenopausal.  Her endometrial lining is quite thin.  We discussed plan for endometrial sampling at the beginning of her surgery which we will plan to send for frozen section.  Even if benign pathology, her preference is to undergo concurrent total hysterectomy at the time of her upcoming surgery.  We also discussed possible need for mini laparotomy given the size of her uterus.  In looking at her CT scan, she has a abdominal wall hernia that I suspect is through her prior C-section incision.  I will reach out to one of our general surgeons who does hernia repairs to see about recommendation for primary closure at the time of surgery.  We discussed the plan for a robotic assisted hysterectomy, bilateral salpingo-oophorectomy, possible staging, possible laparotomy. The risks of surgery were discussed in detail and she understands these to include infection; wound separation; hernia; vaginal cuff separation, injury to adjacent organs such as bowel, bladder, blood vessels, ureters and nerves; bleeding which may require blood transfusion; anesthesia risk; thromboembolic events; possible death; unforeseen complications; possible need for re-exploration; medical complications such as heart attack, stroke, pleural effusion and  pneumonia; and, if full lymphadenectomy is performed the risk of lymphedema and lymphocyst. The patient will receive DVT and antibiotic prophylaxis as indicated. She voiced a clear understanding. She had the opportunity to ask questions.  Perioperative instructions were reviewed with her. Prescriptions for post-op medications were sent to her pharmacy of choice.  A copy of this note was sent to the patient's referring provider.   75 minutes of total time was spent for this patient encounter, including preparation, face-to-face counseling with the patient and coordination of care, and documentation of the encounter.  Eugene Garnet, MD  Division of Gynecologic Oncology  Department of Obstetrics and Gynecology  [facility]  ___________________________________________  Chief Complaint: No chief complaint on file.   History of Present Illness:  Samantha Pierce is a 53 y.o. y.o. female who is seen in consultation at the request of Dr. Charlotta Newton for an evaluation of a complex adnexal mass, elevated ROMA (driven by elevated HE4).  The patient initially presented to the emergency department in late January for constipation over a 2-week period.  Given abdominal tenderness, CT imaging was performed.  This showed a 5.9 cm complex left adnexal mass, cholelithiasis without cholecystitis, fat-containing lower right anterior abdominal wall ventral hernia, moderate stool burden throughout the colon, and fibroid uterus.  No adenopathy or ascites noted.  She was referred for outpatient follow-up to GYN.  She was seen on 2/5 and underwent an office ultrasound which showed a uterus measuring 7 cm with multiple fibroids, the largest of which measured up to 4.4 cm.  Endometrium was 4.1 mm.  Right ovary was normal in appearance.  Left ovary contained a 4.8 x 4.4 x 3.6 cm vascular complex cyst with a septation and a mural nodule measuring up to 1.8 cm.   Tumors were obtained on 2/5: CA 19-9: 9 CEA: 2.3 CA-125: 6 HE4: 670 Postmenopausal ROMA: 4.97  The patient comes in with her sister today.  She notes that at least since October she has had some low back pain on the right.  She also endorses some mid abdominal pain and  cramping as well as right lower quadrant pain.  She was sent to the emergency department by her nephrologist given pain and no bowel movement for about 2 weeks.  She ultimately received a suppository and had CT imaging as discussed above.  She was then referred to GYN given CT findings.  She endorses constipation at baseline.  Saw GI in the fall last year and was started on Linzess.  Continues to struggle with constipation symptoms.  She endorses urinating more recently.  Her appetite is overall decreased since starting Ozempic.  She has lost between 45 and 50 pounds.  Her history is notable for end-stage renal disease on dialysis.  She is on dialysis Monday, Wednesday, Friday.  Her nephrologist is Dr. Wolfgang Phoenix with [facility].  She is on a kidney transplant list.  Her history is also notable for type 2 diabetes.  She is a continuous monitor.  Takes long-acting insulin, 40 units at night and uses 6-10 units of short acting on a sliding scale 3 times daily.  Her blood sugars can get as high as 220s.  She is also on Ozempic for her diabetes.  She has a history of a stroke in 2016 as well as a TIA in 2017.  She is scheduled to see a neurologist at [facility] on April 25 given her symptoms of neuropathy and memory loss.  Has been previously diagnosed with COPD but has been told recently that this is not active.  She has been able to stop her albuterol inhaler.  She has a history of tobacco use which she describes as smoking 2 or 3 cigarettes a day for about 30 years.  She will go long periods of time without smoking at all.  She had her first cigarette today for the first time in January.  She endorses some interest in quitting, has spoken to her psychiatrist recently about this.  In terms of her menstrual history, she endorses abnormal bleeding which she describes as darker brown bleeding that occurs approximately every other month.  She occasionally has some intermenstrual  bleeding.  She reports hot flashes for approximately 1 year.  PAST MEDICAL HISTORY:  Past Medical History:  Diagnosis Date   Anemia    Anxiety    Asystole (HCC)    a. 05/2015: pt developed bradycardia->asystole during Lexiscan nuclear stress test, s/p brief code. Cath with only 30% prox LCx. Her asystole during Lexiscan infusion was felt to represent an excessive pharmacologic response to the agent and likely superimposed vasovagal response.   Bipolar disorder (HCC)    CKD (chronic kidney disease) stage 4, GFR 15-29 ml/min (HCC)    M_W_F dialysis   COPD (chronic obstructive pulmonary disease) (HCC)    Depression    Essential hypertension    GERD (gastroesophageal reflux disease)    History of blood transfusion 2011   History of seizure 2017   only one seizure - none since   History of stroke 09/2014   Acute lacunar infarct of the left thalamus   Leg cramps    Mild CAD    a. LHC 05/2015: 30% prox Cx, otherwise widely patent.   Morbid obesity (HCC)    Neuromuscular disorder (HCC)    neuropathy in feet   Neuropathy    feet   PTSD (post-traumatic stress disorder)    Retinal detachment    legally blind in right eye   Stroke (HCC)    x 2, right side weakness   Type 2 diabetes mellitus (HCC)      PAST SURGICAL HISTORY:  Past Surgical History:  Procedure Laterality Date   AV FISTULA PLACEMENT Right 01/08/2017   Procedure: RIGHT ARM BRACHIOCEPHALIC ARTERIOVENOUS (AV) FISTULA CREATION;  Surgeon: Maeola Harman, MD;  Location[facility] OR;  Service: Vascular;  Laterality: Right;   AV FISTULA PLACEMENT Left 11/20/2018   Procedure: Creation of LEFT ARM ARTERIOVENOUS (AV) FISTULA;  Surgeon: Maeola Harman, MD;  Location[facility] OR;  Service: Vascular;  Laterality: Left;   AV FISTULA PLACEMENT Right 02/24/2021   Procedure: RIGHT UPPER ARM BASILIC VEIN ARTERIOVENOUS (AV) FISTULA CREATION;  Surgeon: Leonie Douglas, MD;  Location: [facility] OR;  Service: Vascular;  Laterality: Right;    AV FISTULA PLACEMENT Right 05/05/2021   Procedure: INSERTION OF ARTERIOVENOUS (AV) GORE-TEX GRAFT ARM;  Surgeon: Leonie Douglas, MD;  Location: [facility] OR;  Service: Vascular;  Laterality: Right;   BASCILIC VEIN TRANSPOSITION Left 02/11/2019   Procedure: BASILIC VEIN TRANSPOSITION SECOND STAGE;  Surgeon: Maeola Harman, MD;  Location: [facility] OR;  Service: Vascular;  Laterality: Left;   CARDIAC CATHETERIZATION N/A 06/01/2015   Procedure: Left Heart Cath and Coronary Angiography;  Surgeon: Lyn Records, MD; CFX 30%, no other dz, EF nl by echo   CESAREAN SECTION     x2   COLONOSCOPY WITH PROPOFOL N/A 07/27/2022   Procedure: COLONOSCOPY WITH PROPOFOL;  Surgeon: Marletta Lor,  Hennie Duos, DO;  Location: [facility] ENDO SUITE;  Service: Endoscopy;  Laterality: N/A;  930am, asa 3, dialysis pt   DILATION AND CURETTAGE OF UTERUS     EYE SURGERY     Right eye pars plano vitrectomy    HEMATOMA EVACUATION Left 03/03/2019   Procedure: EVACUATION HEMATOMA;  Surgeon: Sherren Kerns, MD;  Location: [facility] OR;  Service: Vascular;  Laterality: Left;   INSERTION OF DIALYSIS CATHETER Right 01/04/2021   Procedure: INSERTION OF RIGHT INTERNAL JUGULAR TUNNELED DIALYSIS CATHETER;  Surgeon: Sherren Kerns, MD;  Location: [facility] OR;  Service: Vascular;  Laterality: Right;   LASIK     LIGATION OF ARTERIOVENOUS  FISTULA Left 01/04/2021   Procedure: LIGATION OF LEFT ARM ARTERIOVENOUS  FISTULA;  Surgeon: Sherren Kerns, MD;  Location: [facility] OR;  Service: Vascular;  Laterality: Left;   PARS PLANA VITRECTOMY  04/20/2011   Procedure: PARS PLANA VITRECTOMY WITH 25 GAUGE;  Surgeon: Sherrie George, MD;  Location: [facility] OR;  Service: Ophthalmology;  Laterality: Left;  membrane peel, gas fluid exchange, endolaser, repair of complex retinal detachment   PATCH ANGIOPLASTY Left 10/05/2020   Procedure: PATCH ANGIOPLASTY USING Livia Snellen BIOLOGIC PATCH;  Surgeon: Sherren Kerns, MD;  Location: [facility] OR;  Service: Vascular;  Laterality: Left;   PERIPHERAL  VASCULAR BALLOON ANGIOPLASTY Left 09/02/2020   Procedure: PERIPHERAL VASCULAR BALLOON ANGIOPLASTY;  Surgeon: Cephus Shelling, MD;  Location: [facility] INVASIVE CV LAB;  Service: Cardiovascular;  Laterality: Left;  arm fistula   REMOVAL OF A DIALYSIS CATHETER N/A 06/21/2021   Procedure: MINOR REMOVAL OF TUNNELED DIALYSIS CATHETER;  Surgeon: Larina Earthly, MD;  Location: [facility] ORS;  Service: Vascular;  Laterality: N/A;   REVISON OF ARTERIOVENOUS FISTULA Left 10/05/2020   Procedure: REVISION OF LEFT ARM ARTERIOVENOUS FISTULA;  Surgeon: Sherren Kerns, MD;  Location: [facility] OR;  Service: Vascular;  Laterality: Left;   TUBAL LIGATION      OB/GYN HISTORY:  OB History  Gravida Para Term Preterm AB Living  2 2 2   2   SAB IAB Ectopic Multiple Live Births          # Outcome Date GA Lbr Len/2nd Weight Sex Type Anes PTL Lv  2 Term           1 Term             No LMP recorded (lmp unknown). Patient is postmenopausal.  Age at menarche: 55  Age at menopause: see HPI Hx of HRT: denies Hx of STDs: denies Last pap: 07/10/22 - NIML, HR HPV negative History of abnormal pap smears: denies  SCREENING STUDIES:  Last mammogram: 2024  Last colonoscopy: 2024  MEDICATIONS: Outpatient Encounter Medications as of 07/27/2023  Medication Sig   amLODipine (NORVASC) 10 MG tablet Take 1 tablet (10 mg total) by mouth daily.   atorvastatin (LIPITOR) 80 MG tablet Take 1 tablet (80 mg total) by mouth daily at 6 PM. (Patient taking differently: Take 80 mg by mouth at bedtime.)   bisacodyl (DULCOLAX) 10 MG suppository Place 1 suppository (10 mg total) rectally as needed for moderate constipation.   cloNIDine (CATAPRES) 0.2 MG tablet Take 0.2 mg by mouth 2 (two) times daily.   Continuous Blood Gluc Receiver (DEXCOM G6 RECEIVER) DEVI USE TO CHECK GLUCOSE FOUR TIMES DAILY AS DIRECTED.   FLUoxetine (PROZAC) 10 MG tablet Take 10 mg by mouth daily. Take with 20 mg for a total of 30 mg daily   FLUoxetine (PROZAC) 20 MG capsule  Take  20 mg by mouth daily. Take with 10 mg for a total of 30 mg daily   furosemide (LASIX) 80 MG tablet Take 80 mg by mouth 2 (two) times daily.   gabapentin (NEURONTIN) 300 MG capsule TAKE (1) CAPSULE BY MOUTH DAILY. (Patient taking differently: Take 900 mg by mouth 3 (three) times daily.)   hydrALAZINE (APRESOLINE) 50 MG tablet Take 50 mg by mouth 2 (two) times daily.   hydrOXYzine (ATARAX) 10 MG tablet Take 10 mg by mouth 3 (three) times daily.   insulin lispro (HUMALOG KWIKPEN) 100 UNIT/ML KwikPen Inject 6-12 Units into the skin 3 (three) times daily.   lactulose (CHRONULAC) 10 GM/15ML solution Take 10 g by mouth daily as needed for moderate constipation.   Lancets (ONETOUCH DELICA PLUS LANCET33G) MISC Apply topically.   linaclotide (LINZESS) 290 MCG CAPS capsule Take 1 capsule (290 mcg total) by mouth daily before breakfast.   omeprazole (PRILOSEC) 20 MG capsule Take 20 mg by mouth daily before breakfast.    ONETOUCH VERIO test strip SMARTSIG:Via Meter   polyethylene glycol (MIRALAX) 17 g packet Take 17 g by mouth daily.   Semaglutide, 2 MG/DOSE, (OZEMPIC, 2 MG/DOSE,) 8 MG/3ML SOPN INJECT 2 MG ONCE A WEEK AS DIRECTED   traZODone (DESYREL) 100 MG tablet Take 100 mg by mouth at bedtime.   TRESIBA FLEXTOUCH 100 UNIT/ML FlexTouch Pen INJECT 40 UNITS INTO THE SKIN AT BEDTIME.   No facility-administered encounter medications on file as of 07/27/2023.    ALLERGIES:  Allergies  Allergen Reactions   Contrast Media [Iodinated Contrast Media] Anaphylaxis    "code blue--I died"   Penicillins Rash and Other (See Comments)    Childhood      FAMILY HISTORY:  Family History  Problem Relation Age of Onset   Diabetes Mother    Kidney failure Mother    Diabetes Father    Amblyopia Neg Hx    Blindness Neg Hx    Cataracts Neg Hx    Glaucoma Neg Hx    Macular degeneration Neg Hx    Retinal detachment Neg Hx    Strabismus Neg Hx    Retinitis pigmentosa Neg Hx      SOCIAL HISTORY:  Social  Connections: Not on file    REVIEW OF SYSTEMS:  + Decreased appetite, hearing loss, ringing in ears, vision problems, abdominal distention, abdominal pain, constipation, diarrhea, urinary frequency, incontinence, hot flashes, vaginal discharge, back pain, muscle pain/cramps, dizziness, numbness, anxiety, depression, confusion, decreased concentration Denies fevers, chills, fatigue, unexplained weight changes. Denies neck lumps or masses, mouth sores or voice changes. Denies cough or wheezing.  Denies shortness of breath. Denies chest pain or palpitations. Denies leg swelling. Denies blood in stools, nausea, vomiting, or early satiety. Denies pain with intercourse, dysuria, hematuria. Denies pelvic pain, vaginal bleeding.   Denies itching, rash, or wounds. Denies headaches or seizures. Denies swollen lymph nodes or glands, denies easy bruising or bleeding.  Physical Exam:  Vital Signs for this encounter:  Blood pressure 133/62, pulse 86, temperature 98.4 F (36.9 C), temperature source Oral, resp. rate 19, height 5\' 4"  (1.626 m), weight 220 lb (99.8 kg), SpO2 99%. Body mass index is 37.76 kg/m. General: Alert, oriented, no acute distress.  HEENT: Normocephalic, atraumatic. Sclera anicteric.  Chest: Clear to auscultation bilaterally. No wheezes, rhonchi, or rales. Cardiovascular: Regular rate and rhythm, no murmurs, rubs, or gallops.  Abdomen: Obese. Normoactive bowel sounds. Soft, nondistended, nontender to palpation. No masses or hepatosplenomegaly appreciated. No palpable fluid wave.  Some pain with deeper palpation in the low mid/right abdomen. Extremities: Grossly normal range of motion. Warm, well perfused. No edema bilaterally.  Skin: No rashes or lesions.  Lymphatics: No cervical, supraclavicular, or inguinal adenopathy.  GU:  Normal external female genitalia. No lesions. No discharge or bleeding.             Bladder/urethra:  No lesions or masses, well supported bladder              Vagina: Well-rugated, no vaginal lesions.             Cervix: Normal appearing, no lesions.             Uterus: Exam somewhat limited by body habitus.  Uterus is mildly enlarged, mobile, no parametrial involvement or nodularity.             Adnexa: Some fullness appreciated in the cul-de-sac to the left, no nodularity.  Rectal: Deferred.  LABORATORY AND RADIOLOGIC DATA:  Outside medical records were reviewed to synthesize the above history, along with the history and physical obtained during the visit.   Lab Results  Component Value Date   WBC 7.3 06/25/2023   HGB 12.9 06/25/2023   HCT 41.1 06/25/2023   PLT 209 06/25/2023   GLUCOSE 189 (H) 06/25/2023   CHOL 153 09/04/2022   TRIG 122 09/04/2022   HDL 55 09/04/2022   LDLCALC 76 09/04/2022   ALT 19 06/25/2023   AST 20 06/25/2023   NA 136 06/25/2023   K 3.8 06/25/2023   CL 94 (L) 06/25/2023   CREATININE 4.24 (H) 06/25/2023   BUN 24 (H) 06/25/2023   CO2 30 06/25/2023   TSH 0.524 09/04/2022   INR 0.97 06/01/2015   HGBA1C 7.4 (A) 01/16/2023

## 2023-07-26 NOTE — H&P (View-Only) (Signed)
 GYNECOLOGIC ONCOLOGY NEW PATIENT CONSULTATION   Patient Name: Samantha Pierce  Patient Age: 53 y.o. Date of Service: 07/27/23 Referring Provider: Dr. Katha Hamming  Primary Care Provider: Estevan Oaks, NP Consulting Provider: Eugene Garnet, MD   Assessment/Plan:  Perimenopausal patient with complex adnexal mass.  Significant comorbidities include history of stroke, insulin-dependent type 2 diabetes, and end-stage renal disease on dialysis.  I spent some time reviewing with the patient findings on her recent CT scan and ultrasound.  We looked at CT images together.  The mass overall looks cystic although there is some complexity to it, better appreciated on ultrasound.  We also discussed her recent tumor markers.  While HE4 is a tumor marker for ovarian cancer, it has also been observed to be elevated in benign conditions including chronic kidney disease.  Some studies have shown higher levels of HE4 patients with CKD compared to ovarian cancer and increased HE4 with worsening renal function. Given her CKD, I do not know that this is a reliable marker for her (and her resulting high risk ROMA).  We discussed her normal CA125.  I stressed that similarly this is not a diagnostic test.  This tumor marker can be normal and up to 50% of patients with early stage ovarian cancer and abnormal in other benign disease processes.  While imaging is not diagnostic of malignancy, given features of her adnexal mass, I would recommend that we proceed with surgery for diagnostic purposes.  In the setting of her multiple comorbidities, we discussed needing clearance from nephrology, cardiology, neurology, and her primary care provider.  My office will reach out to anesthesia about decision to do surgery here versus at [facility].  We will likely plan on at least an overnight stay.  If the surgery is at [facility], we may be able to do the surgery here on the day after dialysis with plan to discharge her the next  morning to go to outpatient dialysis.  Otherwise, she would require transfer to [facility] for dialysis inpatient.  We discussed the importance of glycemic control around the time of surgery given increased perioperative morbidity as well as increased risk of infection.  Goal would be for her blood sugars to be under 1 80-200 in the perioperative period.  Given hot flashes for the last year as well as some abnormal uterine bleeding, I suspect that she is perimenopausal.  Her endometrial lining is quite thin.  We discussed plan for endometrial sampling at the beginning of her surgery which we will plan to send for frozen section.  Even if benign pathology, her preference is to undergo concurrent total hysterectomy at the time of her upcoming surgery.  We also discussed possible need for mini laparotomy given the size of her uterus.  In looking at her CT scan, she has a abdominal wall hernia that I suspect is through her prior C-section incision.  I will reach out to one of our general surgeons who does hernia repairs to see about recommendation for primary closure at the time of surgery.  We discussed the plan for a robotic assisted hysterectomy, bilateral salpingo-oophorectomy, possible staging, possible laparotomy. The risks of surgery were discussed in detail and she understands these to include infection; wound separation; hernia; vaginal cuff separation, injury to adjacent organs such as bowel, bladder, blood vessels, ureters and nerves; bleeding which may require blood transfusion; anesthesia risk; thromboembolic events; possible death; unforeseen complications; possible need for re-exploration; medical complications such as heart attack, stroke, pleural effusion and  pneumonia; and, if full lymphadenectomy is performed the risk of lymphedema and lymphocyst. The patient will receive DVT and antibiotic prophylaxis as indicated. She voiced a clear understanding. She had the opportunity to ask questions.  Perioperative instructions were reviewed with her. Prescriptions for post-op medications were sent to her pharmacy of choice.  A copy of this note was sent to the patient's referring provider.   75 minutes of total time was spent for this patient encounter, including preparation, face-to-face counseling with the patient and coordination of care, and documentation of the encounter.  Eugene Garnet, MD  Division of Gynecologic Oncology  Department of Obstetrics and Gynecology  [facility]  ___________________________________________  Chief Complaint: No chief complaint on file.   History of Present Illness:  Samantha Pierce is a 53 y.o. y.o. female who is seen in consultation at the request of Dr. Charlotta Newton for an evaluation of a complex adnexal mass, elevated ROMA (driven by elevated HE4).  The patient initially presented to the emergency department in late January for constipation over a 2-week period.  Given abdominal tenderness, CT imaging was performed.  This showed a 5.9 cm complex left adnexal mass, cholelithiasis without cholecystitis, fat-containing lower right anterior abdominal wall ventral hernia, moderate stool burden throughout the colon, and fibroid uterus.  No adenopathy or ascites noted.  She was referred for outpatient follow-up to GYN.  She was seen on 2/5 and underwent an office ultrasound which showed a uterus measuring 7 cm with multiple fibroids, the largest of which measured up to 4.4 cm.  Endometrium was 4.1 mm.  Right ovary was normal in appearance.  Left ovary contained a 4.8 x 4.4 x 3.6 cm vascular complex cyst with a septation and a mural nodule measuring up to 1.8 cm.   Tumors were obtained on 2/5: CA 19-9: 9 CEA: 2.3 CA-125: 6 HE4: 670 Postmenopausal ROMA: 4.97  The patient comes in with her sister today.  She notes that at least since October she has had some low back pain on the right.  She also endorses some mid abdominal pain and  cramping as well as right lower quadrant pain.  She was sent to the emergency department by her nephrologist given pain and no bowel movement for about 2 weeks.  She ultimately received a suppository and had CT imaging as discussed above.  She was then referred to GYN given CT findings.  She endorses constipation at baseline.  Saw GI in the fall last year and was started on Linzess.  Continues to struggle with constipation symptoms.  She endorses urinating more recently.  Her appetite is overall decreased since starting Ozempic.  She has lost between 45 and 50 pounds.  Her history is notable for end-stage renal disease on dialysis.  She is on dialysis Monday, Wednesday, Friday.  Her nephrologist is Dr. Wolfgang Phoenix with [facility].  She is on a kidney transplant list.  Her history is also notable for type 2 diabetes.  She is a continuous monitor.  Takes long-acting insulin, 40 units at night and uses 6-10 units of short acting on a sliding scale 3 times daily.  Her blood sugars can get as high as 220s.  She is also on Ozempic for her diabetes.  She has a history of a stroke in 2016 as well as a TIA in 2017.  She is scheduled to see a neurologist at [facility] neurology on April 25 given her symptoms of neuropathy and memory loss.  Has been previously diagnosed with COPD but has been told recently that this is not active.  She has been able to stop her albuterol inhaler.  She has a history of tobacco use which she describes as smoking 2 or 3 cigarettes a day for about 30 years.  She will go long periods of time without smoking at all.  She had her first cigarette today for the first time in January.  She endorses some interest in quitting, has spoken to her psychiatrist recently about this.  In terms of her menstrual history, she endorses abnormal bleeding which she describes as darker brown bleeding that occurs approximately every other month.  She occasionally has some intermenstrual  bleeding.  She reports hot flashes for approximately 1 year.  PAST MEDICAL HISTORY:  Past Medical History:  Diagnosis Date   Anemia    Anxiety    Asystole (HCC)    a. 05/2015: pt developed bradycardia->asystole during Lexiscan nuclear stress test, s/p brief code. Cath with only 30% prox LCx. Her asystole during Lexiscan infusion was felt to represent an excessive pharmacologic response to the agent and likely superimposed vasovagal response.   Bipolar disorder (HCC)    CKD (chronic kidney disease) stage 4, GFR 15-29 ml/min (HCC)    M_W_F dialysis   COPD (chronic obstructive pulmonary disease) (HCC)    Depression    Essential hypertension    GERD (gastroesophageal reflux disease)    History of blood transfusion 2011   History of seizure 2017   only one seizure - none since   History of stroke 09/2014   Acute lacunar infarct of the left thalamus   Leg cramps    Mild CAD    a. LHC 05/2015: 30% prox Cx, otherwise widely patent.   Morbid obesity (HCC)    Neuromuscular disorder (HCC)    neuropathy in feet   Neuropathy    feet   PTSD (post-traumatic stress disorder)    Retinal detachment    legally blind in right eye   Stroke (HCC)    x 2, right side weakness   Type 2 diabetes mellitus (HCC)      PAST SURGICAL HISTORY:  Past Surgical History:  Procedure Laterality Date   AV FISTULA PLACEMENT Right 01/08/2017   Procedure: RIGHT ARM BRACHIOCEPHALIC ARTERIOVENOUS (AV) FISTULA CREATION;  Surgeon: Maeola Harman, MD;  Location: [facility] OR;  Service: Vascular;  Laterality: Right;   AV FISTULA PLACEMENT Left 11/20/2018   Procedure: Creation of LEFT ARM ARTERIOVENOUS (AV) FISTULA;  Surgeon: Maeola Harman, MD;  Location: [facility] OR;  Service: Vascular;  Laterality: Left;   AV FISTULA PLACEMENT Right 02/24/2021   Procedure: RIGHT UPPER ARM BASILIC VEIN ARTERIOVENOUS (AV) FISTULA CREATION;  Surgeon: Leonie Douglas, MD;  Location: [facility] OR;  Service: Vascular;  Laterality: Right;    AV FISTULA PLACEMENT Right 05/05/2021   Procedure: INSERTION OF ARTERIOVENOUS (AV) GORE-TEX GRAFT ARM;  Surgeon: Leonie Douglas, MD;  Location: [facility] OR;  Service: Vascular;  Laterality: Right;   BASCILIC VEIN TRANSPOSITION Left 02/11/2019   Procedure: BASILIC VEIN TRANSPOSITION SECOND STAGE;  Surgeon: Maeola Harman, MD;  Location: [facility] OR;  Service: Vascular;  Laterality: Left;   CARDIAC CATHETERIZATION N/A 06/01/2015   Procedure: Left Heart Cath and Coronary Angiography;  Surgeon: Lyn Records, MD; CFX 30%, no other dz, EF nl by echo   CESAREAN SECTION     x2   COLONOSCOPY WITH PROPOFOL N/A 07/27/2022   Procedure: COLONOSCOPY WITH PROPOFOL;  Surgeon: Marletta Lor,  Hennie Duos, DO;  Location: [facility] END[facility]ITE;  Service: Endoscopy;  Laterality: N/A;  930am, asa 3, dialysis pt   DILATION AND CURETTAGE OF UTERUS     EYE SURGERY     Right eye pars plano vitrectomy    HEMATOMA EVACUATION Left 03/03/2019   Procedure: EVACUATION HEMATOMA;  Surgeon: Sherren Kerns, MD;  Location: [facility] OR;  Service: Vascular;  Laterality: Left;   INSERTION OF DIALYSIS CATHETER Right 01/04/2021   Procedure: INSERTION OF RIGHT INTERNAL JUGULAR TUNNELED DIALYSIS CATHETER;  Surgeon: Sherren Kerns, MD;  Location: [facility] OR;  Service: Vascular;  Laterality: Right;   LASIK     LIGATION OF ARTERIOVENOUS  FISTULA Left 01/04/2021   Procedure: LIGATION OF LEFT ARM ARTERIOVENOUS  FISTULA;  Surgeon: Sherren Kerns, MD;  Location: [facility] OR;  Service: Vascular;  Laterality: Left;   PARS PLANA VITRECTOMY  04/20/2011   Procedure: PARS PLANA VITRECTOMY WITH 25 GAUGE;  Surgeon: Sherrie George, MD;  Location: [facility] OR;  Service: Ophthalmology;  Laterality: Left;  membrane peel, gas fluid exchange, endolaser, repair of complex retinal detachment   PATCH ANGIOPLASTY Left 10/05/2020   Procedure: PATCH ANGIOPLASTY USING Livia Snellen BIOLOGIC PATCH;  Surgeon: Sherren Kerns, MD;  Location: [facility] OR;  Service: Vascular;  Laterality: Left;   PERIPHERAL  VASCULAR BALLOON ANGIOPLASTY Left 09/02/2020   Procedure: PERIPHERAL VASCULAR BALLOON ANGIOPLASTY;  Surgeon: Cephus Shelling, MD;  Location: [facility] INVASIVE CV LAB;  Service: Cardiovascular;  Laterality: Left;  arm fistula   REMOVAL OF A DIALYSIS CATHETER N/A 06/21/2021   Procedure: MINOR REMOVAL OF TUNNELED DIALYSIS CATHETER;  Surgeon: Larina Earthly, MD;  Location: [facility] ORS;  Service: Vascular;  Laterality: N/A;   REVISON OF ARTERIOVENOUS FISTULA Left 10/05/2020   Procedure: REVISION OF LEFT ARM ARTERIOVENOUS FISTULA;  Surgeon: Sherren Kerns, MD;  Location: [facility] OR;  Service: Vascular;  Laterality: Left;   TUBAL LIGATION      OB/GYN HISTORY:  OB History  Gravida Para Term Preterm AB Living  2 2 2   2   SAB IAB Ectopic Multiple Live Births          # Outcome Date GA Lbr Len/2nd Weight Sex Type Anes PTL Lv  2 Term           1 Term             No LMP recorded (lmp unknown). Patient is postmenopausal.  Age at menarche: 58  Age at menopause: see HPI Hx of HRT: denies Hx of STDs: denies Last pap: 07/10/22 - NIML, HR HPV negative History of abnormal pap smears: denies  SCREENING STUDIES:  Last mammogram: 2024  Last colonoscopy: 2024  MEDICATIONS: Outpatient Encounter Medications as of 07/27/2023  Medication Sig   amLODipine (NORVASC) 10 MG tablet Take 1 tablet (10 mg total) by mouth daily.   atorvastatin (LIPITOR) 80 MG tablet Take 1 tablet (80 mg total) by mouth daily at 6 PM. (Patient taking differently: Take 80 mg by mouth at bedtime.)   bisacodyl (DULCOLAX) 10 MG suppository Place 1 suppository (10 mg total) rectally as needed for moderate constipation.   cloNIDine (CATAPRES) 0.2 MG tablet Take 0.2 mg by mouth 2 (two) times daily.   Continuous Blood Gluc Receiver (DEXCOM G6 RECEIVER) DEVI USE TO CHECK GLUCOSE FOUR TIMES DAILY AS DIRECTED.   FLUoxetine (PROZAC) 10 MG tablet Take 10 mg by mouth daily. Take with 20 mg for a total of 30 mg daily   FLUoxetine (PROZAC) 20 MG capsule  Take  20 mg by mouth daily. Take with 10 mg for a total of 30 mg daily   furosemide (LASIX) 80 MG tablet Take 80 mg by mouth 2 (two) times daily.   gabapentin (NEURONTIN) 300 MG capsule TAKE (1) CAPSULE BY MOUTH DAILY. (Patient taking differently: Take 900 mg by mouth 3 (three) times daily.)   hydrALAZINE (APRESOLINE) 50 MG tablet Take 50 mg by mouth 2 (two) times daily.   hydrOXYzine (ATARAX) 10 MG tablet Take 10 mg by mouth 3 (three) times daily.   insulin lispro (HUMALOG KWIKPEN) 100 UNIT/ML KwikPen Inject 6-12 Units into the skin 3 (three) times daily.   lactulose (CHRONULAC) 10 GM/15ML solution Take 10 g by mouth daily as needed for moderate constipation.   Lancets (ONETOUCH DELICA PLUS LANCET33G) MISC Apply topically.   linaclotide (LINZESS) 290 MCG CAPS capsule Take 1 capsule (290 mcg total) by mouth daily before breakfast.   omeprazole (PRILOSEC) 20 MG capsule Take 20 mg by mouth daily before breakfast.    ONETOUCH VERIO test strip SMARTSIG:Via Meter   polyethylene glycol (MIRALAX) 17 g packet Take 17 g by mouth daily.   Semaglutide, 2 MG/DOSE, (OZEMPIC, 2 MG/DOSE,) 8 MG/3ML SOPN INJECT 2 MG ONCE A WEEK AS DIRECTED   traZODone (DESYREL) 100 MG tablet Take 100 mg by mouth at bedtime.   TRESIBA FLEXTOUCH 100 UNIT/ML FlexTouch Pen INJECT 40 UNITS INTO THE SKIN AT BEDTIME.   No facility-administered encounter medications on file as of 07/27/2023.    ALLERGIES:  Allergies  Allergen Reactions   Contrast Media [Iodinated Contrast Media] Anaphylaxis    "code blue--I died"   Penicillins Rash and Other (See Comments)    Childhood      FAMILY HISTORY:  Family History  Problem Relation Age of Onset   Diabetes Mother    Kidney failure Mother    Diabetes Father    Amblyopia Neg Hx    Blindness Neg Hx    Cataracts Neg Hx    Glaucoma Neg Hx    Macular degeneration Neg Hx    Retinal detachment Neg Hx    Strabismus Neg Hx    Retinitis pigmentosa Neg Hx      SOCIAL HISTORY:  Social  Connections: Not on file    REVIEW OF SYSTEMS:  + Decreased appetite, hearing loss, ringing in ears, vision problems, abdominal distention, abdominal pain, constipation, diarrhea, urinary frequency, incontinence, hot flashes, vaginal discharge, back pain, muscle pain/cramps, dizziness, numbness, anxiety, depression, confusion, decreased concentration Denies fevers, chills, fatigue, unexplained weight changes. Denies neck lumps or masses, mouth sores or voice changes. Denies cough or wheezing.  Denies shortness of breath. Denies chest pain or palpitations. Denies leg swelling. Denies blood in stools, nausea, vomiting, or early satiety. Denies pain with intercourse, dysuria, hematuria. Denies pelvic pain, vaginal bleeding.   Denies itching, rash, or wounds. Denies headaches or seizures. Denies swollen lymph nodes or glands, denies easy bruising or bleeding.  Physical Exam:  Vital Signs for this encounter:  Blood pressure 133/62, pulse 86, temperature 98.4 F (36.9 C), temperature source Oral, resp. rate 19, height 5\' 4"  (1.626 m), weight 220 lb (99.8 kg), SpO2 99%. Body mass index is 37.76 kg/m. General: Alert, oriented, no acute distress.  HEENT: Normocephalic, atraumatic. Sclera anicteric.  Chest: Clear to auscultation bilaterally. No wheezes, rhonchi, or rales. Cardiovascular: Regular rate and rhythm, no murmurs, rubs, or gallops.  Abdomen: Obese. Normoactive bowel sounds. Soft, nondistended, nontender to palpation. No masses or hepatosplenomegaly appreciated. No palpable fluid wave.  Some pain with deeper palpation in the low mid/right abdomen. Extremities: Grossly normal range of motion. Warm, well perfused. No edema bilaterally.  Skin: No rashes or lesions.  Lymphatics: No cervical, supraclavicular, or inguinal adenopathy.  GU:  Normal external female genitalia. No lesions. No discharge or bleeding.             Bladder/urethra:  No lesions or masses, well supported bladder              Vagina: Well-rugated, no vaginal lesions.             Cervix: Normal appearing, no lesions.             Uterus: Exam somewhat limited by body habitus.  Uterus is mildly enlarged, mobile, no parametrial involvement or nodularity.             Adnexa: Some fullness appreciated in the cul-de-sac to the left, no nodularity.  Rectal: Deferred.  LABORATORY AND RADIOLOGIC DATA:  Outside medical records were reviewed to synthesize the above history, along with the history and physical obtained during the visit.   Lab Results  Component Value Date   WBC 7.3 06/25/2023   HGB 12.9 06/25/2023   HCT 41.1 06/25/2023   PLT 209 06/25/2023   GLUCOSE 189 (H) 06/25/2023   CHOL 153 09/04/2022   TRIG 122 09/04/2022   HDL 55 09/04/2022   LDLCALC 76 09/04/2022   ALT 19 06/25/2023   AST 20 06/25/2023   NA 136 06/25/2023   K 3.8 06/25/2023   CL 94 (L) 06/25/2023   CREATININE 4.24 (H) 06/25/2023   BUN 24 (H) 06/25/2023   CO2 30 06/25/2023   TSH 0.524 09/04/2022   INR 0.97 06/01/2015   HGBA1C 7.4 (A) 01/16/2023

## 2023-07-27 ENCOUNTER — Inpatient Hospital Stay: Payer: 59 | Admitting: Gynecologic Oncology

## 2023-07-27 ENCOUNTER — Encounter: Payer: Self-pay | Admitting: Gynecologic Oncology

## 2023-07-27 ENCOUNTER — Inpatient Hospital Stay: Payer: 59 | Attending: Gynecologic Oncology | Admitting: Gynecologic Oncology

## 2023-07-27 ENCOUNTER — Telehealth: Payer: Self-pay | Admitting: Nurse Practitioner

## 2023-07-27 ENCOUNTER — Inpatient Hospital Stay: Payer: 59 | Admitting: *Deleted

## 2023-07-27 VITALS — BP 133/62 | HR 86 | Temp 98.4°F | Resp 19 | Ht 64.0 in | Wt 220.0 lb

## 2023-07-27 DIAGNOSIS — D398 Neoplasm of uncertain behavior of other specified female genital organs: Secondary | ICD-10-CM | POA: Insufficient documentation

## 2023-07-27 DIAGNOSIS — Z780 Asymptomatic menopausal state: Secondary | ICD-10-CM | POA: Diagnosis not present

## 2023-07-27 DIAGNOSIS — F419 Anxiety disorder, unspecified: Secondary | ICD-10-CM | POA: Insufficient documentation

## 2023-07-27 DIAGNOSIS — Z794 Long term (current) use of insulin: Secondary | ICD-10-CM | POA: Diagnosis not present

## 2023-07-27 DIAGNOSIS — F319 Bipolar disorder, unspecified: Secondary | ICD-10-CM | POA: Diagnosis not present

## 2023-07-27 DIAGNOSIS — G709 Myoneural disorder, unspecified: Secondary | ICD-10-CM | POA: Diagnosis not present

## 2023-07-27 DIAGNOSIS — F4310 Post-traumatic stress disorder, unspecified: Secondary | ICD-10-CM | POA: Diagnosis not present

## 2023-07-27 DIAGNOSIS — Z79899 Other long term (current) drug therapy: Secondary | ICD-10-CM | POA: Insufficient documentation

## 2023-07-27 DIAGNOSIS — E1165 Type 2 diabetes mellitus with hyperglycemia: Secondary | ICD-10-CM

## 2023-07-27 DIAGNOSIS — N186 End stage renal disease: Secondary | ICD-10-CM | POA: Insufficient documentation

## 2023-07-27 DIAGNOSIS — N83209 Unspecified ovarian cyst, unspecified side: Secondary | ICD-10-CM

## 2023-07-27 DIAGNOSIS — Z992 Dependence on renal dialysis: Secondary | ICD-10-CM | POA: Insufficient documentation

## 2023-07-27 DIAGNOSIS — Z7985 Long-term (current) use of injectable non-insulin antidiabetic drugs: Secondary | ICD-10-CM | POA: Insufficient documentation

## 2023-07-27 DIAGNOSIS — N184 Chronic kidney disease, stage 4 (severe): Secondary | ICD-10-CM | POA: Diagnosis not present

## 2023-07-27 DIAGNOSIS — R978 Other abnormal tumor markers: Secondary | ICD-10-CM | POA: Insufficient documentation

## 2023-07-27 DIAGNOSIS — I120 Hypertensive chronic kidney disease with stage 5 chronic kidney disease or end stage renal disease: Secondary | ICD-10-CM | POA: Insufficient documentation

## 2023-07-27 DIAGNOSIS — E669 Obesity, unspecified: Secondary | ICD-10-CM | POA: Diagnosis not present

## 2023-07-27 DIAGNOSIS — K5900 Constipation, unspecified: Secondary | ICD-10-CM | POA: Diagnosis not present

## 2023-07-27 DIAGNOSIS — E1122 Type 2 diabetes mellitus with diabetic chronic kidney disease: Secondary | ICD-10-CM | POA: Insufficient documentation

## 2023-07-27 DIAGNOSIS — F17210 Nicotine dependence, cigarettes, uncomplicated: Secondary | ICD-10-CM | POA: Diagnosis not present

## 2023-07-27 DIAGNOSIS — Z6837 Body mass index (BMI) 37.0-37.9, adult: Secondary | ICD-10-CM | POA: Insufficient documentation

## 2023-07-27 DIAGNOSIS — Z8673 Personal history of transient ischemic attack (TIA), and cerebral infarction without residual deficits: Secondary | ICD-10-CM | POA: Diagnosis not present

## 2023-07-27 DIAGNOSIS — K219 Gastro-esophageal reflux disease without esophagitis: Secondary | ICD-10-CM | POA: Diagnosis not present

## 2023-07-27 DIAGNOSIS — I10 Essential (primary) hypertension: Secondary | ICD-10-CM

## 2023-07-27 NOTE — Telephone Encounter (Signed)
   Name: Samantha Pierce  DOB: [birth year]  MRN: [id number]  Primary Cardiologist: Nona Dell, MD  Chart reviewed as part of pre-operative protocol coverage. Because of Samantha Pierce's past medical history and time since last visit, Samantha Pierce will require a follow-up in-office visit in order to better assess preoperative cardiovascular risk. Last seen by Sharlene Dory, NP on 07/13/2022. Samantha Pierce is not on any anticoagulation or antiplatelet therapy per chart review.   Pre-op covering staff: - Please schedule appointment and call patient to inform them. If patient already had an upcoming appointment within acceptable timeframe, please add "pre-op clearance" to the appointment notes so provider is aware. - Please contact requesting surgeon's office via preferred method (i.e, phone, fax) to inform them of need for appointment prior to surgery.   Joni Reining, NP  07/27/2023, 4:42 PM

## 2023-07-27 NOTE — Telephone Encounter (Signed)
   Pre-operative Risk Assessment    Patient Name: Samantha Pierce  DOB: 30-May-1971 MRN: [id number]      Request for Surgical Clearance    Procedure:   Endometrial bx robotic assisted total laparoccopic hysterectomy, bilateral salpingo-sophorectomy, possible staging possible laparotomy   Date of Surgery:  Clearance 08/23/23                                  Surgeon:  Dr. Eugene Garnet Surgeon's Group or Practice Name:  [facility] Phone number:  [phone] Fax number:  [phone]   Type of Clearance Requested:   - Medical    Type of Anesthesia:  General    Additional requests/questions:    Sallyanne Havers   07/27/2023, 4:37 PM

## 2023-07-27 NOTE — Telephone Encounter (Signed)
Patient is scheduled for an appointment 2/25/235 and her procedure is scheduled for 08/23/23 clearance can be addressed at office visit

## 2023-07-27 NOTE — Progress Notes (Signed)
[facility] Clinical Social Work  Initial Assessment   Samantha Pierce is a 53 y.o. year old female presenting alone. Clinical Social Work was referred by medical provider for assessment of psychosocial needs.   SDOH (Social Determinants of Health) assessments performed: Yes SDOH Interventions    Flowsheet Row Office Visit from 07/27/2023 in [facility] - A [facility] Of [facility]  SDOH Interventions   Depression Interventions/Treatment  Counseling       SDOH Screenings   Food Insecurity: Food Insecurity Present (07/24/2023)  Housing: Unknown (07/24/2023)  Transportation Needs: No Transportation Needs (07/24/2023)  Utilities: At Risk (07/24/2023)  Depression (PHQ2-9): High Risk (07/24/2023)  Tobacco Use: High Risk (07/27/2023)     Pierce/Social Information:  Housing Arrangement: patient lives with her 63 year old son Pierce members/support persons in your life? Pierce, Friends, Samantha Pierce, Neighbors- Samantha Pierce identified her support system as her "Samantha Pierce".  She moved to [place] from [place] thirteen years ago and has become very involved in her community. She shared her friends are like Pierce, she specifically identified her "sisters" Samantha Pierce and Samantha Pierce.  She has an adult son and a 46 year old son as well. Transportation concerns: limited transportation, but patient is familiar with resources available- she utilizes medical transport through her insurance provider and rides the [place] bus in [place]. She enjoys riding the bus in her community and talking with other community members.  Employment: Disabled- on dialysis Income source: Social Security Disability Financial concerns: Possible needs. Type of concern:  Samantha Pierce did not identify specific concerns during our meeting. Food access concerns: yes, indicated during SDOH assessment- patient is aware of food resources available in her community Religious or spiritual practice: Yes-she's a member of  [employer] and shared how her spirituality is a source of strength for her in coping with anxiety, depression, and chronic illness. Advanced directives: No - Samantha Pierce remembers completing ADs in a hospitalization in 2016, but it is not on file. She also indicated she'd like to designate her son as her HCPOA now that he's an adult. CSW tentatively scheduled patient for next AD clinic appt on 2/28 at 10am. [facility] team will follow up with patient to confirm this date/time. The patient is hopeful her son can join her for appointment/discussion. Services Currently in place:  Currently utilizes county and medical transportation. She is established psych/therapy services at [facility].    Coping/ Adjustment to diagnosis: Patient understands treatment plan and what happens next? yes Concerns about diagnosis and/or treatment:  Anxiety regarding possible cancer diagnosis Patient reported stressors: Food, Depression, Anxiety/ nervousness, and Physical issues Hopes and/or priorities: She hopes she can grow from her experience and plans to share her experience as a testimony to help others. 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  She is very community oriented and enjoys connecting with others. Patient enjoys time with Pierce/ friends and sharing her testimony and helping her community Current coping skills/ strengths: Ability for insight , Average or above average intelligence , Capable of independent living , Communication skills , Motivation for treatment/growth , Religious Affiliation , and Supportive Pierce/friends   LCSW reviewed PHQ-9- patient shared she has experienced depression and anxiety for many years. She's established with Mental Health support through [facility] in [place], [place]. Her psychiatry provider is Samantha Pierce. She meets her every 3 months and meets with a therapist monthly.  CSW  explored coping strategies that are already  helpful for patient and encouraged her to utilize these skills as she prepares for GYN surgery. [name] shared several breathing techniques that are extremely effective for her when dealing with stress and anxiety. CSW encouraged patient to continue to work with her therapist. CSW explained if patient has a confirmed cancer diagnosis, [facility] CSW will be an additional layer of support for coping and adjustment to diagnosis.    SUMMARY: Current SDOH Barriers:  Transportation, Mental Health Concerns , and chronic health concerns  Clinical Social Work Clinical Goal(s):  No clinical social work goals at this time, LCSW will follow up with patient if needed after surgery  Interventions: Provided CSW contact information and encouraged patient to call with any questions or concerns Provided education and assistance to patient regarding [employer] , provided general community resource information for [facility]   Follow Up Plan: Patient will contact CSW with any support or resource needs and CSW will follow-up with patient by phone if needed Patient verbalizes understanding of plan: Yes    Samantha Riley, LCSW Clinical Social Worker [facility]

## 2023-07-27 NOTE — Progress Notes (Signed)
Patient here for a pre-operative appointment prior to her scheduled surgery on August 23, 2023. She is scheduled for a endometrial biopsy, robotic assisted total laparoscopic hysterectomy, bilateral salpingo-oophorectomy, possible staging, possible laparotomy. The surgery was discussed in detail.  See after visit summary for additional details.       Discussed post-op pain management in detail including the aspects of the enhanced recovery pathway.  Advised her that a new prescription would be sent in and it is only to be used for after her upcoming surgery.  We discussed the use of tylenol post-op and to monitor for a maximum of 4,000 mg in a 24 hour period. Discussed bowel regimen in detail.     Discussed the use of SCDs and measures to take at home to prevent DVT including frequent mobility.  Reportable signs and symptoms of DVT discussed. Post-operative instructions discussed and expectations for after surgery. Incisional care discussed as well including reportable signs and symptoms including erythema, drainage, wound separation.     30 minutes spent with the patient.  Verbalizing understanding of material discussed. No needs or concerns voiced at the end of the visit.   Advised patient to call for any needs.  Advised that her post-operative medications had been prescribed and could be picked up at any time.    This appointment is included in the global surgical bundle as pre-operative teaching and has no charge.

## 2023-07-27 NOTE — Patient Instructions (Signed)
We will need to get risk stratification/optimization (clearance) from your medical providers to proceed with surgery. We will also need to determine from your nephrologist and anesthesia if you can have surgery at [facility] or needed at [facility].  Preparing for your Surgery  Plan for surgery on tentative date of August 23, 2023 with Dr. Eugene Garnet at [facility]  (location may change to [facility] if recommended by anesthesia). You will be scheduled for endometrial biopsy (sampling from the lining of the uterus), robotic assisted total laparoscopic hysterectomy (removal of the uterus and cervix), bilateral salpingo-oophorectomy (removal of both ovaries and fallopian tubes), possible staging, possible laparotomy (larger incision on your abdomen if needed).   Pre-operative Testing -You will receive a phone call from presurgical testing at [facility] to arrange for a pre-operative appointment and lab work.  -Bring your insurance card, copy of an advanced directive if applicable, medication list  -At that visit, you will be asked to sign a consent for a possible blood transfusion in case a transfusion becomes necessary during surgery.  The need for a blood transfusion is rare but having consent is a necessary part of your care.     -You should not be taking blood thinners or aspirin at least ten days prior to surgery unless instructed by your surgeon.  -Do not take supplements such as fish oil (omega 3), red yeast rice, turmeric before your surgery. STOP TAKING AT LEAST 10 DAYS BEFORE SURGERY. You want to avoid medications with aspirin in them including headache powders such as BC or Goody's), Excedrin migraine.  Day Before Surgery at Home -You will be asked to take in a light diet the day before surgery. You will be advised you can have clear liquids up until 3 hours before your surgery.    Eat a light diet the day before surgery.  Examples including soups, broths, toast,  yogurt, mashed potatoes.  AVOID GAS PRODUCING FOODS AND BEVERAGES. Things to avoid include carbonated beverages (fizzy beverages, sodas), raw fruits and raw vegetables (uncooked), or beans.   If your bowels are filled with gas, your surgeon will have difficulty visualizing your pelvic organs which increases your surgical risks.  Your role in recovery Your role is to become active as soon as directed by your doctor, while still giving yourself time to heal.  Rest when you feel tired. You will be asked to do the following in order to speed your recovery:  - Cough and breathe deeply. This helps to clear and expand your lungs and can prevent pneumonia after surgery.  - STAY ACTIVE WHEN YOU GET HOME. Do mild physical activity. Walking or moving your legs help your circulation and body functions return to normal. Do not try to get up or walk alone the first time after surgery.   -If you develop swelling on one leg or the other, pain in the back of your leg, redness/warmth in one of your legs, please call the office or go to the Emergency Room to have a doppler to rule out a blood clot. For shortness of breath, chest pain-seek care in the Emergency Room as soon as possible. - Actively manage your pain. Managing your pain lets you move in comfort. We will ask you to rate your pain on a scale of zero to 10. It is your responsibility to tell your doctor or nurse where and how much you hurt so your pain can be treated.  Special Considerations -If you are diabetic,  you may be placed on insulin after surgery to have closer control over your blood sugars to promote healing and recovery.  This does not mean that you will be discharged on insulin.  If applicable, your oral antidiabetics will be resumed when you are tolerating a solid diet.  -Your final pathology results from surgery should be available around one week after surgery and the results will be relayed to you when available.  -FMLA forms can be faxed to  [phone] and please allow 5-7 business days for completion.  Pain Management After Surgery -You will be prescribed your pain medication and bowel regimen medications before surgery so that you can have these available when you are discharged from the hospital. The pain medication is for use ONLY AFTER surgery and a new prescription will not be given.   -Make sure that you have Tylenol IF YOU ARE ABLE TO TAKE THESE MEDICATION at home to use on a regular basis after surgery for pain control.   -Review the attached handout on narcotic use and their risks and side effects.   Bowel Regimen -You will be prescribed Sennakot-S to take nightly to prevent constipation especially if you are taking the narcotic pain medication intermittently.  It is important to prevent constipation and drink adequate amounts of liquids. You can stop taking this medication when you are not taking pain medication and you are back on your normal bowel routine.  Risks of Surgery Risks of surgery are low but include bleeding, infection, damage to surrounding structures, re-operation, blood clots, and very rarely death.   Blood Transfusion Information (For the consent to be signed before surgery)  We will be checking your blood type before surgery so in case of emergencies, we will know what type of blood you would need.                                            WHAT IS A BLOOD TRANSFUSION?  A transfusion is the replacement of blood or some of its parts. Blood is made up of multiple cells which provide different functions. Red blood cells carry oxygen and are used for blood loss replacement. White blood cells fight against infection. Platelets control bleeding. Plasma helps clot blood. Other blood products are available for specialized needs, such as hemophilia or other clotting disorders. BEFORE THE TRANSFUSION  Who gives blood for transfusions?  You may be able to donate blood to be used at a later date on  yourself (autologous donation). Relatives can be asked to donate blood. This is generally not any safer than if you have received blood from a stranger. The same precautions are taken to ensure safety when a relative's blood is donated. Healthy volunteers who are fully evaluated to make sure their blood is safe. This is blood bank blood. Transfusion therapy is the safest it has ever been in the practice of medicine. Before blood is taken from a donor, a complete history is taken to make sure that person has no history of diseases nor engages in risky social behavior (examples are intravenous drug use or sexual activity with multiple partners). The donor's travel history is screened to minimize risk of transmitting infections, such as malaria. The donated blood is tested for signs of infectious diseases, such as HIV and hepatitis. The blood is then tested to be sure it is compatible with you in order to  minimize the chance of a transfusion reaction. If you or a relative donates blood, this is often done in anticipation of surgery and is not appropriate for emergency situations. It takes many days to process the donated blood. RISKS AND COMPLICATIONS Although transfusion therapy is very safe and saves many lives, the main dangers of transfusion include:  Getting an infectious disease. Developing a transfusion reaction. This is an allergic reaction to something in the blood you were given. Every precaution is taken to prevent this. The decision to have a blood transfusion has been considered carefully by your caregiver before blood is given. Blood is not given unless the benefits outweigh the risks.  AFTER SURGERY INSTRUCTIONS  Return to work: 4-6 weeks if applicable  Activity: 1. Be up and out of the bed during the day.  Take a nap if needed.  You may walk up steps but be careful and use the hand rail.  Stair climbing will tire you more than you think, you may need to stop part way and rest.   2. No  lifting or straining for 6 weeks over 10 pounds. No pushing, pulling, straining for 6 weeks.  3. No driving for 1-61 days when the following criteria have been met: Do not drive if you are taking narcotic pain medicine and make sure that your reaction time has returned.   4. You can shower as soon as the next day after surgery. Shower daily.  Use your regular soap and water (not directly on the incision) and pat your incision(s) dry afterwards; don't rub.  No tub baths or submerging your body in water until cleared by your surgeon. If you have the soap that was given to you by pre-surgical testing that was used before surgery, you do not need to use it afterwards because this can irritate your incisions.   5. No sexual activity and nothing in the vagina for 10-12 weeks.  6. You may experience a small amount of clear drainage from your incisions, which is normal.  If the drainage persists, increases, or changes color please call the office.  7. Do not use creams, lotions, or ointments such as neosporin on your incisions after surgery until advised by your surgeon because they can cause removal of the dermabond glue on your incisions.    8. You may experience vaginal spotting after surgery or when the stitches at the top of the vagina begin to dissolve.  The spotting is normal but if you experience heavy bleeding, call our office.  9. Take Tylenol first for pain if you are able to take these medication and only use narcotic pain medication for severe pain not relieved by the Tylenol.  Monitor your Tylenol intake to a max of 4,000 mg in a 24 hour period.   Diet: 1. Low sodium Heart Healthy Diet is recommended but you are cleared to resume your normal (before surgery) diet after your procedure.  2. It is safe to use a laxative, such as Miralax or Colace, if you have difficulty moving your bowels before surgery. You have been prescribed Sennakot-S to take at bedtime every evening after surgery to keep  bowel movements regular and to prevent constipation.    Wound Care: 1. Keep clean and dry.  Shower daily.  Reasons to call the Doctor: Fever - Oral temperature greater than 100.4 degrees Fahrenheit Foul-smelling vaginal discharge Difficulty urinating Nausea and vomiting Increased pain at the site of the incision that is unrelieved with pain medicine. Difficulty breathing with  or without chest pain New calf pain especially if only on one side Sudden, continuing increased vaginal bleeding with or without clots.   Contacts: For questions or concerns you should contact:  Dr. Eugene Garnet at [phone]  Warner Mccreedy, NP at [phone]  After Hours: call [phone] and have the GYN Oncologist paged/contacted (after 5 pm or on the weekends). You will speak with an after hours RN and let he or she know you have had surgery.  Messages sent via mychart are for non-urgent matters and are not responded to after hours so for urgent needs, please call the after hours number.

## 2023-07-30 ENCOUNTER — Telehealth: Payer: Self-pay | Admitting: *Deleted

## 2023-07-30 NOTE — Telephone Encounter (Signed)
 I will update all parties involved pt has appt 07/31/23 with Dr. Diona Browner.

## 2023-07-30 NOTE — Telephone Encounter (Signed)
 Pre Dr Pricilla Holm sent records and surgical optimization form to the patient's PCP Debria Garret, NP [phone]), neurology office (Dr Terrace Arabia [phone]), cardiology office (Dr Diona Browner [phone]) and nephrology office (Dr Wolfgang Phoenix [phone])

## 2023-07-31 ENCOUNTER — Encounter: Payer: Self-pay | Admitting: Cardiology

## 2023-07-31 ENCOUNTER — Other Ambulatory Visit: Payer: Self-pay | Admitting: Nurse Practitioner

## 2023-07-31 ENCOUNTER — Ambulatory Visit: Payer: 59 | Attending: Cardiology | Admitting: Cardiology

## 2023-07-31 VITALS — BP 134/74 | HR 84 | Ht 64.0 in | Wt 225.0 lb

## 2023-07-31 DIAGNOSIS — Z01810 Encounter for preprocedural cardiovascular examination: Secondary | ICD-10-CM

## 2023-07-31 DIAGNOSIS — I10 Essential (primary) hypertension: Secondary | ICD-10-CM | POA: Diagnosis not present

## 2023-07-31 DIAGNOSIS — E782 Mixed hyperlipidemia: Secondary | ICD-10-CM

## 2023-07-31 DIAGNOSIS — N186 End stage renal disease: Secondary | ICD-10-CM | POA: Diagnosis not present

## 2023-07-31 DIAGNOSIS — Z992 Dependence on renal dialysis: Secondary | ICD-10-CM

## 2023-07-31 DIAGNOSIS — I2510 Atherosclerotic heart disease of native coronary artery without angina pectoris: Secondary | ICD-10-CM | POA: Diagnosis not present

## 2023-07-31 NOTE — Patient Instructions (Signed)
 Medication Instructions:  Your physician recommends that you continue on your current medications as directed. Please refer to the Current Medication list given to you today.  Labwork: none  Testing/Procedures: none  Follow-Up: Your physician recommends that you schedule a follow-up appointment in: 1 year. You will receive a reminder call in about 10 months reminding you to schedule your appointment. If you don't receive this call, please contact our office.  Any Other Special Instructions Will Be Listed Below (If Applicable).  If you need a refill on your cardiac medications before your next appointment, please call your pharmacy.

## 2023-07-31 NOTE — Progress Notes (Unsigned)
 Referring Provider: Estevan Oaks, NP Primary Care Physician:  Estevan Oaks, NP Primary GI Physician: Dr. Marletta Lor  No chief complaint on file.   HPI:   Samantha Pierce is a 53 y.o. female with history of ESRD on dialysis, COPD, HTN, stroke, diabetes, depression, bipolar disorder, GERD, chronic constipation, presenting today for follow-up of chronic constipation, presenting today for follow-up of constipation and to discuss rescheduling colonoscopy.   Last seen in the office 03/1723.  Constipation was not adequately managed as she was taking Linzess 290 mcg 3 days a week after returning home from dialysis.  Recommended taking Linzess every day and following up in 4-6 weeks to discuss scheduling colonoscopy.  Unfortunately, we are unable to reach her to schedule her follow-up.  Today:     Scheduled for robotic assisted total hysterectomy with bilateral salpingo-oophorectomy 08/23/2023 for complex adnexal mass.    Colonoscopy 07/27/2022 with inadequate prep, stool in the entire colon. Recommended repeat colonoscopy in 6 months due to poor prep.    Past Medical History:  Diagnosis Date   Anemia    Anxiety    Asystole (HCC)    a. 05/2015: pt developed bradycardia->asystole during Lexiscan nuclear stress test, s/p brief code. Cath with only 30% prox LCx. Her asystole during Lexiscan infusion was felt to represent an excessive pharmacologic response to the agent and likely superimposed vasovagal response.   Bipolar disorder (HCC)    CKD (chronic kidney disease) stage 4, GFR 15-29 ml/min (HCC)    M_W_F dialysis   COPD (chronic obstructive pulmonary disease) (HCC)    Depression    Essential hypertension    GERD (gastroesophageal reflux disease)    History of blood transfusion 2011   History of seizure 2017   only one seizure - none since   History of stroke 09/2014   Acute lacunar infarct of the left thalamus   Leg cramps    Mild CAD    a. LHC 05/2015: 30% prox Cx,  otherwise widely patent.   Morbid obesity (HCC)    Neuromuscular disorder (HCC)    neuropathy in feet   Neuropathy    feet   PTSD (post-traumatic stress disorder)    Retinal detachment    legally blind in right eye   Stroke (HCC)    x 2, right side weakness   Type 2 diabetes mellitus (HCC)     Past Surgical History:  Procedure Laterality Date   AV FISTULA PLACEMENT Right 01/08/2017   Procedure: RIGHT ARM BRACHIOCEPHALIC ARTERIOVENOUS (AV) FISTULA CREATION;  Surgeon: Maeola Harman, MD;  Location: [facility] OR;  Service: Vascular;  Laterality: Right;   AV FISTULA PLACEMENT Left 11/20/2018   Procedure: Creation of LEFT ARM ARTERIOVENOUS (AV) FISTULA;  Surgeon: Maeola Harman, MD;  Location: [facility] OR;  Service: Vascular;  Laterality: Left;   AV FISTULA PLACEMENT Right 02/24/2021   Procedure: RIGHT UPPER ARM BASILIC VEIN ARTERIOVENOUS (AV) FISTULA CREATION;  Surgeon: Leonie Douglas, MD;  Location: [facility] OR;  Service: Vascular;  Laterality: Right;   AV FISTULA PLACEMENT Right 05/05/2021   Procedure: INSERTION OF ARTERIOVENOUS (AV) GORE-TEX GRAFT ARM;  Surgeon: Leonie Douglas, MD;  Location: [facility] OR;  Service: Vascular;  Laterality: Right;   BASCILIC VEIN TRANSPOSITION Left 02/11/2019   Procedure: BASILIC VEIN TRANSPOSITION SECOND STAGE;  Surgeon: Maeola Harman, MD;  Location: [facility] OR;  Service: Vascular;  Laterality: Left;   CARDIAC CATHETERIZATION N/A 06/01/2015   Procedure: Left Heart Cath and Coronary Angiography;  Surgeon:  Lyn Records, MD; CFX 30%, no other dz, EF nl by echo   CESAREAN SECTION     x2   COLONOSCOPY WITH PROPOFOL N/A 07/27/2022   Procedure: COLONOSCOPY WITH PROPOFOL;  Surgeon: Lanelle Bal, DO;  Location: [facility] SUITE;  Service: Endoscopy;  Laterality: N/A;  930am, asa 3, dialysis pt   DILATION AND CURETTAGE OF UTERUS     EYE SURGERY     Right eye pars plano vitrectomy    HEMATOMA EVACUATION Left 03/03/2019   Procedure: EVACUATION HEMATOMA;   Surgeon: Sherren Kerns, MD;  Location: [facility] OR;  Service: Vascular;  Laterality: Left;   INSERTION OF DIALYSIS CATHETER Right 01/04/2021   Procedure: INSERTION OF RIGHT INTERNAL JUGULAR TUNNELED DIALYSIS CATHETER;  Surgeon: Sherren Kerns, MD;  Location: [facility] OR;  Service: Vascular;  Laterality: Right;   LASIK     LIGATION OF ARTERIOVENOUS  FISTULA Left 01/04/2021   Procedure: LIGATION OF LEFT ARM ARTERIOVENOUS  FISTULA;  Surgeon: Sherren Kerns, MD;  Location: [facility] OR;  Service: Vascular;  Laterality: Left;   PARS PLANA VITRECTOMY  04/20/2011   Procedure: PARS PLANA VITRECTOMY WITH 25 GAUGE;  Surgeon: Sherrie George, MD;  Location: [facility] OR;  Service: Ophthalmology;  Laterality: Left;  membrane peel, gas fluid exchange, endolaser, repair of complex retinal detachment   PATCH ANGIOPLASTY Left 10/05/2020   Procedure: PATCH ANGIOPLASTY USING Livia Snellen BIOLOGIC PATCH;  Surgeon: Sherren Kerns, MD;  Location: [facility] OR;  Service: Vascular;  Laterality: Left;   PERIPHERAL VASCULAR BALLOON ANGIOPLASTY Left 09/02/2020   Procedure: PERIPHERAL VASCULAR BALLOON ANGIOPLASTY;  Surgeon: Cephus Shelling, MD;  Location: [facility] INVASIVE CV LAB;  Service: Cardiovascular;  Laterality: Left;  arm fistula   REMOVAL OF A DIALYSIS CATHETER N/A 06/21/2021   Procedure: MINOR REMOVAL OF TUNNELED DIALYSIS CATHETER;  Surgeon: Larina Earthly, MD;  Location: [facility] ORS;  Service: Vascular;  Laterality: N/A;   REVISON OF ARTERIOVENOUS FISTULA Left 10/05/2020   Procedure: REVISION OF LEFT ARM ARTERIOVENOUS FISTULA;  Surgeon: Sherren Kerns, MD;  Location: [facility] OR;  Service: Vascular;  Laterality: Left;   TUBAL LIGATION      Current Outpatient Medications  Medication Sig Dispense Refill   amLODipine (NORVASC) 10 MG tablet Take 1 tablet (10 mg total) by mouth daily. 30 tablet 1   atorvastatin (LIPITOR) 80 MG tablet Take 1 tablet (80 mg total) by mouth daily at 6 PM. (Patient taking differently: Take 80 mg by mouth at bedtime.) 30 tablet 6    bisacodyl (DULCOLAX) 10 MG suppository Place 1 suppository (10 mg total) rectally as needed for moderate constipation. 12 suppository 0   cloNIDine (CATAPRES) 0.2 MG tablet Take 0.2 mg by mouth 2 (two) times daily.     Continuous Blood Gluc Receiver (DEXCOM G6 RECEIVER) DEVI USE TO CHECK GLUCOSE FOUR TIMES DAILY AS DIRECTED. 1 each 0   FLUoxetine (PROZAC) 10 MG capsule Take 10 mg by mouth daily.     FLUoxetine (PROZAC) 20 MG tablet Take 20 mg by mouth daily.     furosemide (LASIX) 80 MG tablet Take 80 mg by mouth 2 (two) times daily.     gabapentin (NEURONTIN) 300 MG capsule TAKE (1) CAPSULE BY MOUTH DAILY. (Patient taking differently: Take 900 mg by mouth 3 (three) times daily.) 30 capsule 0   hydrALAZINE (APRESOLINE) 50 MG tablet Take 50 mg by mouth 2 (two) times daily.     hydrOXYzine (ATARAX) 10 MG tablet Take 10 mg by mouth 3 (  three) times daily.     insulin lispro (HUMALOG KWIKPEN) 100 UNIT/ML KwikPen Inject 6-12 Units into the skin 3 (three) times daily. 30 mL 3   lactulose (CHRONULAC) 10 GM/15ML solution Take 10 g by mouth daily as needed for moderate constipation.     Lancets (ONETOUCH DELICA PLUS LANCET33G) MISC Apply topically.     linaclotide (LINZESS) 290 MCG CAPS capsule Take 1 capsule (290 mcg total) by mouth daily before breakfast. 90 capsule 3   omeprazole (PRILOSEC) 20 MG capsule Take 20 mg by mouth daily before breakfast.      ONETOUCH VERIO test strip SMARTSIG:Via Meter     polyethylene glycol (MIRALAX) 17 g packet Take 17 g by mouth daily. 14 each 0   Semaglutide, 2 MG/DOSE, (OZEMPIC, 2 MG/DOSE,) 8 MG/3ML SOPN INJECT 2 MG ONCE A WEEK AS DIRECTED 3 mL 1   traZODone (DESYREL) 100 MG tablet Take 100 mg by mouth at bedtime.     TRESIBA FLEXTOUCH 100 UNIT/ML FlexTouch Pen INJECT 40 UNITS INTO THE SKIN AT BEDTIME. 15 mL 2   No current facility-administered medications for this visit.    Allergies as of 08/01/2023 - Review Complete 07/31/2023  Allergen Reaction Noted    Contrast media [iodinated contrast media] Anaphylaxis 01/04/2017   Penicillins Rash and Other (See Comments) 04/18/2011    Family History  Problem Relation Age of Onset   Diabetes Mother    Kidney failure Mother    Diabetes Father    Amblyopia Neg Hx    Blindness Neg Hx    Cataracts Neg Hx    Glaucoma Neg Hx    Macular degeneration Neg Hx    Retinal detachment Neg Hx    Strabismus Neg Hx    Retinitis pigmentosa Neg Hx     Social History   Socioeconomic History   Marital status: Single    Spouse name: Not on file   Number of children: Not on file   Years of education: Not on file   Highest education level: Not on file  Occupational History   Not on file  Tobacco Use   Smoking status: Every Day    Current packs/day: 0.00    Average packs/day: (1.2 ttl pk-yrs)    Types: Cigarettes    Start date: 06/05/1981    Last attempt to quit: 06/2022    Years since quitting: 1.1   Smokeless tobacco: Never   Tobacco comments:    Quit in 12/2018 but now smokes off and on per patient 05/04/21.  Vaping Use   Vaping status: Never Used  Substance and Sexual Activity   Alcohol use: Yes    Comment: occ wine   Drug use: No   Sexual activity: Yes    Birth control/protection: Surgical    Comment: Tubal Ligation  Other Topics Concern   Not on file  Social History Narrative   Not on file   Social Drivers of Health   Financial Resource Strain: Not on file  Food Insecurity: Food Insecurity Present (07/24/2023)   Hunger Vital Sign    Worried About Running Out of Food in the Last Year: Sometimes true    Ran Out of Food in the Last Year: Sometimes true  Transportation Needs: No Transportation Needs (07/24/2023)   PRAPARE - Administrator, Civil Service (Medical): No    Lack of Transportation (Non-Medical): No  Physical Activity: Not on file  Stress: Not on file  Social Connections: Not on file    Review of  Systems: Gen: Denies fever, chills, anorexia. Denies fatigue,  weakness, weight loss.  CV: Denies chest pain, palpitations, syncope, peripheral edema, and claudication. Resp: Denies dyspnea at rest, cough, wheezing, coughing up blood, and pleurisy. GI: Denies vomiting blood, jaundice, and fecal incontinence.   Denies dysphagia or odynophagia. Derm: Denies rash, itching, dry skin Psych: Denies depression, anxiety, memory loss, confusion. No homicidal or suicidal ideation.  Heme: Denies bruising, bleeding, and enlarged lymph nodes.  Physical Exam: LMP  (LMP Unknown)  General:   Alert and oriented. No distress noted. Pleasant and cooperative.  Head:  Normocephalic and atraumatic. Eyes:  Conjuctiva clear without scleral icterus. Heart:  S1, S2 present without murmurs appreciated. Lungs:  Clear to auscultation bilaterally. No wheezes, rales, or rhonchi. No distress.  Abdomen:  +BS, soft, non-tender and non-distended. No rebound or guarding. No HSM or masses noted. Msk:  Symmetrical without gross deformities. Normal posture. Extremities:  Without edema. Neurologic:  Alert and  oriented x4 Psych:  Normal mood and affect.    Assessment:     Plan:  ***   Ermalinda Memos, PA-C [facility] Gastroenter[facility] 08/01/2023

## 2023-07-31 NOTE — Progress Notes (Signed)
 Cardiology Office Note  Date: 07/31/2023   ID: Samantha Pierce, DOB 1970-12-04, MRN [id number]  History of Present Illness: Samantha Pierce is a 53 y.o. female last seen in February 2024 by Ms. Philis Nettle NP, I reviewed the note (our last encounter was via telehealth visit in 2021).  She is here today for follow-up cardiac visit and preoperative assessment.  She is being considered for robotic assisted total laparoscopic hysterectomy and bilateral salpingo-oophorectomy with possible laparoscopic staging per Dr. Pricilla Holm.  This is planned to be under general anesthesia.  Baseline RCRI perioperative cardiac risk index is 4 points at 15% chance of major adverse cardiac event.  The majority of her risk profile is however not specifically cardiac (planned abdominal surgery, history of stroke, ESRD on hemodialysis, on insulin).  She states that overall she feels well, has been tolerating hemodialysis for the last 3 years, has sessions on Mondays, Wednesdays, and Fridays.  She sees Dr. Wolfgang Phoenix.  No exertional chest pain, NYHA class I-II dyspnea.  No palpitations or syncope.  She does smoke 3 cigarettes a day and is trying to quit.  I reviewed her medications.  Current regimen includes Norvasc, Lipitor, clonidine, hydralazine, and Ozempic.  I reviewed her ECG today which shows sinus rhythm with left anterior fascicular block and decreased R wave progression.  Physical Exam: VS:  BP 134/74 (BP Location: Right Arm, Patient Position: Sitting, Cuff Size: Normal)   Pulse 84   Ht 5\' 4"  (1.626 m)   Wt 225 lb (102.1 kg)   LMP  (LMP Unknown)   SpO2 98%   BMI 38.62 kg/m , BMI Body mass index is 38.62 kg/m.  Wt Readings from Last 3 Encounters:  07/31/23 225 lb (102.1 kg)  07/27/23 220 lb (99.8 kg)  07/11/23 224 lb 12.8 oz (102 kg)    General: Patient appears comfortable at rest. HEENT: Conjunctiva and lids normal. Neck: Supple, no elevated JVP or carotid bruits. Lungs: Clear to auscultation,  nonlabored breathing at rest. Cardiac: Regular rate and rhythm, no S3 or significant systolic murmur, no pericardial rub. Abdomen:Bowel sounds present. Extremities: No pitting edema.  ECG:  An ECG dated 07/13/2022 was personally reviewed today and demonstrated:  Sinus rhythm.  Labwork: 09/04/2022: TSH 0.524 06/25/2023: ALT 19; AST 20; BUN 24; Creatinine, Ser 4.24; Hemoglobin 12.9; Platelets 209; Potassium 3.8; Sodium 136     Component Value Date/Time   CHOL 153 09/04/2022 1223   TRIG 122 09/04/2022 1223   HDL 55 09/04/2022 1223   CHOLHDL 2.8 09/04/2022 1223   CHOLHDL 5.4 05/23/2015 0545   VLDL 56 (H) 05/23/2015 0545   LDLCALC 76 09/04/2022 1223   Other Studies Reviewed Today:  No interval cardiac testing for review today.  Assessment and Plan:  1.  History of mild CAD by cardiac catheterization in December 2016.  She does not report any angina, ECG reviewed showing sinus rhythm with left anterior fascicular block (had leftward axis last year as well). She is being considered for robotic assisted total laparoscopic hysterectomy and bilateral salpingo-oophorectomy with possible laparoscopic staging per Dr. Pricilla Holm.  Plan is general anesthesia.  As discussed above, RCRI perioperative cardiac risk index indicates 15% chance of major adverse cardiac event, overall high risk for surgery, but not from a specific cardiac etiology (abdominal surgery, ESRD on hemodialysis, history of stroke, on insulin).  She states that her most recent hemoglobin A1c was 6.6%.  She reports compliance with hemodialysis and tolerating the sessions well.  No further cardiac testing indicated  at this time.  I discussed the situation with her today, she plans to pursue surgery.  She would need to hold Ozempic the week prior to anesthesia.  2.  ESRD on hemodialysis.  Continues to follow with Dr. Wolfgang Phoenix.  She dialyzes on Mondays, Wednesdays, and Fridays.  3.  Primary hypertension.  Continue multimodal therapy including  Norvasc 10 mg daily, clonidine 0.2 mg twice daily, and hydralazine 50 mg twice daily.  4.  Mixed hyperlipidemia.  LDL 76 in April 2024.  Continue Lipitor 80 mg daily.  Disposition:  Follow up  1 year, sooner if needed.  Signed, Jonelle Sidle, M.D., F.A.C.C. [facility]

## 2023-08-01 ENCOUNTER — Ambulatory Visit (INDEPENDENT_AMBULATORY_CARE_PROVIDER_SITE_OTHER): Payer: 59 | Admitting: Gastroenterology

## 2023-08-01 ENCOUNTER — Telehealth: Payer: Self-pay | Admitting: *Deleted

## 2023-08-01 ENCOUNTER — Telehealth: Payer: Self-pay | Admitting: Gynecologic Oncology

## 2023-08-01 ENCOUNTER — Encounter: Payer: Self-pay | Admitting: Gastroenterology

## 2023-08-01 VITALS — BP 123/71 | HR 98 | Temp 98.3°F | Ht 64.0 in | Wt 223.2 lb

## 2023-08-01 DIAGNOSIS — K5909 Other constipation: Secondary | ICD-10-CM

## 2023-08-01 DIAGNOSIS — N898 Other specified noninflammatory disorders of vagina: Secondary | ICD-10-CM

## 2023-08-01 DIAGNOSIS — K5904 Chronic idiopathic constipation: Secondary | ICD-10-CM

## 2023-08-01 DIAGNOSIS — Z1211 Encounter for screening for malignant neoplasm of colon: Secondary | ICD-10-CM

## 2023-08-01 NOTE — Telephone Encounter (Signed)
 LMOVM  to return call  CT scheduled at [facility] on Tuesday 08/07/23, arrive at 9:30 am to check in , NPO 4 hours prior  TCS asa 3 w/Dr.Carver, needs 2 full days of clears and 1.5 days of bowel prep.

## 2023-08-01 NOTE — Telephone Encounter (Signed)
 Called patient to check in. Advised if she desired general surgery to be involved with repairing the hernia at the time of surgery with Dr. Pricilla Holm, we would most likely need to adjust the date and she could be looking at surgery delayed from original date by several weeks to a month based on the general surgeon's availability. Advised Dr. Pricilla Holm said we will understand more how things are in relation to the hernia at the time of surgery. Advised there is a chance the hernia will not have to be disturbed and Dr. Pricilla Holm would be able to do the procedure away from this area. Advised Dr. Pricilla Holm also said there is the possibility she could repair the hernia but she is not a general surgeon who specializes in this. Patient states she does not want to wait and delay her procedure with Dr. Pricilla Holm. She would like to proceed as planned. No other concerns voiced. Advised to call for any needs.

## 2023-08-01 NOTE — Patient Instructions (Addendum)
 Stop Linzess.   Start ibsrela 50 mg twice a day with a meal for constipation.  On nondialysis days, take Ibsrela with breakfast and dinner.  On dialysis days, take Ibsrela at lunch and dinner.  It is important that you do not take Allena Napoleon just before a dialysis session. We are providing you with samples of Ibsrela today.  Please call next week to let me know how Allena Napoleon is working for you.  You may continue taking lactulose 30 mL once a day.  You can actually increase it to twice a day if needed.  As we discussed, this could cause increased gas/bloating.  We can try getting you scheduled for another colonoscopy with an extended bowel prep as you requested.  However, please be aware, if your constipation has not improved, you very well may not prep appropriately. It will be important that you keep me updated on your constipation management.  I am also getting you scheduled for a CT of your abdomen and pelvis with oral and rectal contrast to evaluate for possible colovaginal fistula.  Ermalinda Memos, PA-C [facility]astroenter[facility]

## 2023-08-01 NOTE — Telephone Encounter (Signed)
Received cardiology clearance

## 2023-08-02 NOTE — Telephone Encounter (Signed)
 Pt has been informed of CT appt date, time and location. Offered her appointment for 08/13/23 for colonoscopy before her other surgery. She states she can't do that date because she has dialysis on M,W,F so she wants to wait until after CT to see and go from there.

## 2023-08-03 ENCOUNTER — Inpatient Hospital Stay: Payer: 59

## 2023-08-03 NOTE — Progress Notes (Signed)
 [facility] Healthcare Advance Directives Clinical Social Work  Patient presented to Advance Directives Clinic  to review and complete healthcare advance directives.  Clinical Social Worker met with patient and her son Samantha Pierce.  The patient designated Samantha Pierce as their primary healthcare agent and Samantha Pierce as their secondary agent.  Patient also completed healthcare living will.    Documents were notarized and copies made for patient/family. Clinical Social Worker will send documents to medical records to be scanned into patient's chart.Clinical Social Worker encouraged patient/family to contact with any additional questions or concerns.   [name], LCSW Clinical Social Worker [facility]

## 2023-08-07 ENCOUNTER — Ambulatory Visit (HOSPITAL_COMMUNITY): Payer: 59

## 2023-08-09 ENCOUNTER — Telehealth: Payer: Self-pay

## 2023-08-09 ENCOUNTER — Encounter: Payer: Self-pay | Admitting: Neurology

## 2023-08-09 ENCOUNTER — Ambulatory Visit: Payer: 59 | Admitting: Neurology

## 2023-08-09 VITALS — BP 141/78 | HR 89 | Ht 64.0 in | Wt 225.2 lb

## 2023-08-09 DIAGNOSIS — G5603 Carpal tunnel syndrome, bilateral upper limbs: Secondary | ICD-10-CM | POA: Insufficient documentation

## 2023-08-09 DIAGNOSIS — I639 Cerebral infarction, unspecified: Secondary | ICD-10-CM

## 2023-08-09 MED ORDER — DICLOFENAC SODIUM 1 % EX GEL: CUTANEOUS | 11 refills | Status: DC

## 2023-08-09 MED ORDER — LIDOCAINE-PRILOCAINE 2.5-2.5 % EX CREA: 1.0000 | TOPICAL_CREAM | CUTANEOUS | 11 refills | Status: AC | PRN

## 2023-08-09 NOTE — Telephone Encounter (Signed)
 I reached out to Ms.Gradillas. She is aware of message from Warner Mccreedy NP regarding the general surgeon being available at the same time as Dr.Tuckers surgery.

## 2023-08-09 NOTE — Telephone Encounter (Signed)
 Per Warner Mccreedy NP, attempted to reach patient regarding recent message from [facility] pertaining to her upcoming surgery. Voicemail is full on mobile phone and home number was unavailable.   Warner Mccreedy NP notified.

## 2023-08-09 NOTE — Telephone Encounter (Signed)
-----   Message from Doylene Bode sent at 08/09/2023 12:53 PM EST ----- Regarding: FW: hernia follow up Please let the patient know one of the general surgeons will be available on the date of her surgery and he states he can help with the hernia repair at the same time as Dr. Winferd Humphrey surgery. Please let her know one of his surgery team members will be reaching out. Dr. Sheliah Hatch is his name. ----- Message ----- From: Kinsinger, De Blanch, MD Sent: 08/09/2023  11:38 AM EST To: Jearld Lesch; Carver Fila, MD; # Subject: hernia follow up                               I can help Dr. Pricilla Holm at [facility] on the day scheduled to fix this lower hernia robotically (or after extraction?). I will have our schedulers reach out to have her come to see me in clinic next week ----- Message ----- From: Quentin Ore, MD Sent: 08/09/2023  11:30 AM EST To: Rodman Pickle, MD   ----- Message ----- From: Carver Fila, MD Sent: 07/27/2023  12:47 PM EST To: Quentin Ore, MD  Ulla Gallo, Would you mind taking a look at this patient's imaging?  I am scheduling her for surgery in mid March, trying to figure out whether I can do it at [facility] or need to do it at [facility] as she is dialysis dependent.  She has a complex pelvic mass that needs to come out.  Plan is to do a total hysterectomy with removal of her tubes and ovaries.  She has kind of strange right mid/lateral hernia, that may be elated to prior C-sections.  I am a little worried about reducing this without fixing it.  Happy to throw some stitches in it if you think that is appropriate but wanted to see if you think she would benefit from a real repair. Thanks! kat

## 2023-08-09 NOTE — Progress Notes (Signed)
 Chief Complaint  Patient presents with   Room 14    Pt is here Alone. Pt states that she hasn't gotten all of her memory back from her strokes. Pt states that she has numbness in her feet,hands, and legs. Pt states that she has burning sensations in her hands, as well as cramping. Pt states that she is getting a total hysterectomy.        ASSESSMENT AND PLAN  Samantha Pierce is a 53 y.o. female   Diabetic peripheral neuropathy Possible carpal tunnel syndromes  She had long history of diabetes, also developed end-stage renal disease, on dialysis, female contributed to her length-dependent slow worsening axonal sensorimotor polyneuropathy, with possible bilateral carpal tunnel syndromes,  Already on polypharmacy treatment,  EMLA gel, may mixed together with diclofenac gel as needed.  Laboratory evaluation such as TSH, B12 to rule out treatable cause for peripheral neuropathy  DIAGNOSTIC DATA (LABS, IMAGING, TESTING) - I reviewed patient records, labs, notes, testing and imaging myself where available.   MEDICAL HISTORY:  Samantha Pierce, is a 53 year old female seen in request by her primary care from [facility] NP Estevan Oaks, for evaluation of neuropathy, bilateral hands paresthesia, initial evaluation was on August 09, 2023.  History is obtained from the patient and review of electronic medical records. I personally reviewed pertinent available imaging films in PACS.   PMHx of  HTN Obesity Depression, anxiety PTSD, 911 survival  DM since 1988, insulin dependent Obesity HLD ESRD-HD since 2022, MWF Stroke, left side weakness, memory loss,   She had long history of diabetes, insulin-dependent, many years history of slow worsening bilateral feet paresthesia, starting from plantar surface, now to the top of her feet, stinging burning sensation, taking trazodone, Prozac, gabapentin with limited help  She has developed end-stage renal disease been on dialysis for 3  years, used to type a lot, went on disability, suffered PTSD from 911, she was at [facility]  Since 2021, she experienced intermittent bilateral hands paresthesia, more on the right side, fingertip sensitive have to wear a glove sometimes, sparing fifth finger, she has some gait issues due to bilateral feet paresthesia and also joints pain    PHYSICAL EXAM:   Vitals:   08/09/23 1520  BP: (!) 141/78  Pulse: 89  Weight: 225 lb 3.2 oz (102.2 kg)  Height: 5\' 4"  (1.626 m)    Body mass index is 38.66 kg/m.  PHYSICAL EXAMNIATION:  Gen: NAD, conversant, well nourised, well groomed                     Cardiovascular: Regular rate rhythm, no peripheral edema, warm, nontender. Eyes: Conjunctivae clear without exudates or hemorrhage Neck: Supple, no carotid bruits. Pulmonary: Clear to auscultation bilaterally   NEUROLOGICAL EXAM:  MENTAL STATUS: Speech/cognition: Awake, alert, oriented to history taking and casual conversation CRANIAL NERVES: CN II: Visual fields are full to confrontation. Pupils are round equal and briskly reactive to light. CN III, IV, VI: extraocular movement are normal. No ptosis. CN V: Facial sensation is intact to light touch CN VII: Face is symmetric with normal eye closure  CN VIII: Hearing is normal to causal conversation. CN IX, X: Phonation is normal. CN XI: Head turning and shoulder shrug are intact  MOTOR: There is no pronator drift of out-stretched arms. Muscle bulk and tone are normal. Muscle strength is normal.  REFLEXES: Reflexes are 2+ and symmetric at the biceps, triceps, knees, and absent at ankles.  Plantar responses are flexor.  SENSORY: Length-dependent decreased light touch, pinprick to above ankle level, decreased pinprick at first 3 finger pads bilaterally  COORDINATION: There is no trunk or limb dysmetria noted.  GAIT/STANCE: Push-up to get up from seated position mildly antalgic  REVIEW OF SYSTEMS:  Full 14 system review of  systems performed and notable only for as above All other review of systems were negative.   ALLERGIES: Allergies  Allergen Reactions   Contrast Media [Iodinated Contrast Media] Anaphylaxis    "code blue--I died"   Penicillins Rash and Other (See Comments)    Childhood     HOME MEDICATIONS: Current Outpatient Medications  Medication Sig Dispense Refill   amLODipine (NORVASC) 10 MG tablet 1 tablet Orally Once a day     atorvastatin (LIPITOR) 80 MG tablet Take 1 tablet (80 mg total) by mouth daily at 6 PM. (Patient taking differently: Take 80 mg by mouth at bedtime.) 30 tablet 6   cloNIDine (CATAPRES) 0.2 MG tablet Take 0.2 mg by mouth 2 (two) times daily.     Continuous Blood Gluc Receiver (DEXCOM G6 RECEIVER) DEVI USE TO CHECK GLUCOSE FOUR TIMES DAILY AS DIRECTED. 1 each 0   FLUoxetine (PROZAC) 40 MG capsule 1 tablet Orally Once a day for 30 days     furosemide (LASIX) 80 MG tablet 1 tablet Orally twice a day     gabapentin (NEURONTIN) 300 MG capsule TAKE (1) CAPSULE BY MOUTH DAILY. (Patient taking differently: Take 900 mg by mouth 2 (two) times daily.) 30 capsule 0   hydrALAZINE (APRESOLINE) 50 MG tablet 1 tablet with food Orally twice a day     hydrOXYzine (ATARAX) 10 MG tablet 1 tablet Orally up to three times daily as needed for anxiety     insulin lispro (HUMALOG KWIKPEN) 100 UNIT/ML KwikPen Inject 6-12 Units into the skin 3 (three) times daily. 30 mL 3   lactulose (CHRONULAC) 10 GM/15ML solution Take 10 g by mouth daily as needed for moderate constipation.     Metoprolol Succinate 50 MG CS24 as directed Orally     omeprazole (PRILOSEC OTC) 20 MG tablet 1 capsule 30 minutes before morning meal Orally Once a day     polyethylene glycol (MIRALAX) 17 g packet Take 17 g by mouth daily. 14 each 0   Semaglutide, 2 MG/DOSE, (OZEMPIC, 2 MG/DOSE,) 8 MG/3ML SOPN INJECT 2 MG ONCE A WEEK AS DIRECTED 3 mL 1   Tenapanor HCl (IBSRELA) 50 MG TABS Take by mouth.     traZODone (DESYREL) 100 MG  tablet Take 100 mg by mouth at bedtime.     TRESIBA FLEXTOUCH 100 UNIT/ML FlexTouch Pen INJECT 40 UNITS INTO THE SKIN AT BEDTIME. 15 mL 2   amLODipine (NORVASC) 10 MG tablet Take 1 tablet (10 mg total) by mouth daily. (Patient not taking: Reported on 08/09/2023) 30 tablet 1   cloNIDine (CATAPRES) 0.2 MG tablet 1 tablet Orally twice a day (Patient not taking: Reported on 08/09/2023)     FLUoxetine (PROZAC) 10 MG capsule Take 10 mg by mouth daily. (Patient not taking: Reported on 08/09/2023)     furosemide (LASIX) 80 MG tablet Take 80 mg by mouth 2 (two) times daily. (Patient not taking: Reported on 08/09/2023)     gabapentin (NEURONTIN) 300 MG capsule 1 capsule po At bedtime (Patient not taking: Reported on 08/09/2023)     hydrALAZINE (APRESOLINE) 50 MG tablet Take 50 mg by mouth 2 (two) times daily. (Patient not taking: Reported on 08/09/2023)  hydrOXYzine (ATARAX) 10 MG tablet Take 10 mg by mouth 3 (three) times daily. (Patient not taking: Reported on 08/09/2023)     Lancets (ONETOUCH DELICA PLUS LANCET33G) MISC Apply topically. (Patient not taking: Reported on 08/09/2023)     linaclotide (LINZESS) 290 MCG CAPS capsule Take 1 capsule (290 mcg total) by mouth daily before breakfast. (Patient not taking: Reported on 08/09/2023) 90 capsule 3   omeprazole (PRILOSEC) 20 MG capsule Take 20 mg by mouth daily before breakfast.  (Patient not taking: Reported on 08/09/2023)     ONETOUCH VERIO test strip SMARTSIG:Via Meter (Patient not taking: Reported on 08/09/2023)     No current facility-administered medications for this visit.    PAST MEDICAL HISTORY: Past Medical History:  Diagnosis Date   Anemia    Anxiety    Asystole (HCC)    a. 05/2015: pt developed bradycardia->asystole during Lexiscan nuclear stress test, s/p brief code. Cath with only 30% prox LCx. Her asystole during Lexiscan infusion was felt to represent an excessive pharmacologic response to the agent and likely superimposed vasovagal response.   Bipolar  disorder (HCC)    CKD (chronic kidney disease) stage 4, GFR 15-29 ml/min (HCC)    M_W_F dialysis   COPD (chronic obstructive pulmonary disease) (HCC)    Depression    Essential hypertension    GERD (gastroesophageal reflux disease)    History of blood transfusion 2011   History of seizure 2017   only one seizure - none since   History of stroke 09/2014   Acute lacunar infarct of the left thalamus   Leg cramps    Mild CAD    a. LHC 05/2015: 30% prox Cx, otherwise widely patent.   Morbid obesity (HCC)    Neuromuscular disorder (HCC)    neuropathy in feet   Neuropathy    feet   PTSD (post-traumatic stress disorder)    Retinal detachment    legally blind in right eye   Stroke (HCC)    x 2, right side weakness   Type 2 diabetes mellitus (HCC)     PAST SURGICAL HISTORY: Past Surgical History:  Procedure Laterality Date   AV FISTULA PLACEMENT Right 01/08/2017   Procedure: RIGHT ARM BRACHIOCEPHALIC ARTERIOVENOUS (AV) FISTULA CREATION;  Surgeon: Maeola Harman, MD;  Location: [facility] OR;  Service: Vascular;  Laterality: Right;   AV FISTULA PLACEMENT Left 11/20/2018   Procedure: Creation of LEFT ARM ARTERIOVENOUS (AV) FISTULA;  Surgeon: Maeola Harman, MD;  Location: [facility] OR;  Service: Vascular;  Laterality: Left;   AV FISTULA PLACEMENT Right 02/24/2021   Procedure: RIGHT UPPER ARM BASILIC VEIN ARTERIOVENOUS (AV) FISTULA CREATION;  Surgeon: Leonie Douglas, MD;  Location: [facility] OR;  Service: Vascular;  Laterality: Right;   AV FISTULA PLACEMENT Right 05/05/2021   Procedure: INSERTION OF ARTERIOVENOUS (AV) GORE-TEX GRAFT ARM;  Surgeon: Leonie Douglas, MD;  Location: [facility] OR;  Service: Vascular;  Laterality: Right;   BASCILIC VEIN TRANSPOSITION Left 02/11/2019   Procedure: BASILIC VEIN TRANSPOSITION SECOND STAGE;  Surgeon: Maeola Harman, MD;  Location: [facility] OR;  Service: Vascular;  Laterality: Left;   CARDIAC CATHETERIZATION N/A 06/01/2015   Procedure: Left Heart Cath and  Coronary Angiography;  Surgeon: Lyn Records, MD; CFX 30%, no other dz, EF nl by echo   CESAREAN SECTION     x2   COLONOSCOPY WITH PROPOFOL N/A 07/27/2022   Procedure: COLONOSCOPY WITH PROPOFOL;  Surgeon: Lanelle Bal, DO;  Location: [facility];  Service: Endoscopy;  Laterality:  N/A;  930am, asa 3, dialysis pt   DILATION AND CURETTAGE OF UTERUS     EYE SURGERY     Right eye pars plano vitrectomy    HEMATOMA EVACUATION Left 03/03/2019   Procedure: EVACUATION HEMATOMA;  Surgeon: Sherren Kerns, MD;  Location: [facility] OR;  Service: Vascular;  Laterality: Left;   INSERTION OF DIALYSIS CATHETER Right 01/04/2021   Procedure: INSERTION OF RIGHT INTERNAL JUGULAR TUNNELED DIALYSIS CATHETER;  Surgeon: Sherren Kerns, MD;  Location: [facility] OR;  Service: Vascular;  Laterality: Right;   LASIK     LIGATION OF ARTERIOVENOUS  FISTULA Left 01/04/2021   Procedure: LIGATION OF LEFT ARM ARTERIOVENOUS  FISTULA;  Surgeon: Sherren Kerns, MD;  Location: [facility] OR;  Service: Vascular;  Laterality: Left;   PARS PLANA VITRECTOMY  04/20/2011   Procedure: PARS PLANA VITRECTOMY WITH 25 GAUGE;  Surgeon: Sherrie George, MD;  Location: [facility] OR;  Service: Ophthalmology;  Laterality: Left;  membrane peel, gas fluid exchange, endolaser, repair of complex retinal detachment   PATCH ANGIOPLASTY Left 10/05/2020   Procedure: PATCH ANGIOPLASTY USING Livia Snellen BIOLOGIC PATCH;  Surgeon: Sherren Kerns, MD;  Location: [facility] OR;  Service: Vascular;  Laterality: Left;   PERIPHERAL VASCULAR BALLOON ANGIOPLASTY Left 09/02/2020   Procedure: PERIPHERAL VASCULAR BALLOON ANGIOPLASTY;  Surgeon: Cephus Shelling, MD;  Location: [facility] INVAS[facility];  Service: Cardiovascular;  Laterality: Left;  arm fistula   REMOVAL OF A DIALYSIS CATHETER N/A 06/21/2021   Procedure: MINOR REMOVAL OF TUNNELED DIALYSIS CATHETER;  Surgeon: Larina Earthly, MD;  Location: [facility] ORS;  Service: Vascular;  Laterality: N/A;   REVISON OF ARTERIOVENOUS FISTULA Left 10/05/2020    Procedure: REVISION OF LEFT ARM ARTERIOVENOUS FISTULA;  Surgeon: Sherren Kerns, MD;  Location: [facility] OR;  Service: Vascular;  Laterality: Left;   TUBAL LIGATION      FAMILY HISTORY: Family History  Problem Relation Age of Onset   Diabetes Mother    Kidney failure Mother    Diabetes Father    Amblyopia Neg Hx    Blindness Neg Hx    Cataracts Neg Hx    Glaucoma Neg Hx    Macular degeneration Neg Hx    Retinal detachment Neg Hx    Strabismus Neg Hx    Retinitis pigmentosa Neg Hx     SOCIAL HISTORY: Social History   Socioeconomic History   Marital status: Single    Spouse name: Not on file   Number of children: Not on file   Years of education: Not on file   Highest education level: Not on file  Occupational History   Not on file  Tobacco Use   Smoking status: Every Day    Current packs/day: 0.00    Average packs/day: (1.2 ttl pk-yrs)    Types: Cigarettes    Start date: 06/05/1981    Last attempt to quit: 06/2022    Years since quitting: 1.1   Smokeless tobacco: Never   Tobacco comments:    Quit in 12/2018 but now smokes off and on per patient 05/04/21.  Vaping Use   Vaping status: Never Used  Substance and Sexual Activity   Alcohol use: Yes    Comment: occ wine   Drug use: No   Sexual activity: Yes    Birth control/protection: Surgical    Comment: Tubal Ligation  Other Topics Concern   Not on file  Social History Narrative   Not on file   Social Drivers of Health   Financial  Resource Strain: Not on file  Food Insecurity: Food Insecurity Present (07/24/2023)   Hunger Vital Sign    Worried About Running Out of Food in the Last Year: Sometimes true    Ran Out of Food in the Last Year: Sometimes true  Transportation Needs: No Transportation Needs (07/24/2023)   PRAPARE - Administrator, Civil Service (Medical): No    Lack of Transportation (Non-Medical): No  Physical Activity: Not on file  Stress: Not on file  Social Connections: Not on file   Intimate Partner Violence: Not At Risk (07/24/2023)   Humiliation, Afraid, Rape, and Kick questionnaire    Fear of Current or Ex-Partner: No    Emotionally Abused: No    Physically Abused: No    Sexually Abused: No      Levert Feinstein, M.D. Ph.D.  [facility] [address] [place], [place] [address] Ph: [phone] Fax: [phone]  CC:  Johnn Hai, NP [address] [place],  [place] [address]  Estevan Oaks, NP

## 2023-08-13 ENCOUNTER — Ambulatory Visit (INDEPENDENT_AMBULATORY_CARE_PROVIDER_SITE_OTHER): Admitting: Nurse Practitioner

## 2023-08-13 ENCOUNTER — Encounter: Payer: Self-pay | Admitting: Nurse Practitioner

## 2023-08-13 VITALS — BP 130/84 | HR 57 | Ht 64.0 in | Wt 223.4 lb

## 2023-08-13 DIAGNOSIS — I10 Essential (primary) hypertension: Secondary | ICD-10-CM

## 2023-08-13 DIAGNOSIS — Z7985 Long-term (current) use of injectable non-insulin antidiabetic drugs: Secondary | ICD-10-CM

## 2023-08-13 DIAGNOSIS — E782 Mixed hyperlipidemia: Secondary | ICD-10-CM

## 2023-08-13 DIAGNOSIS — E1165 Type 2 diabetes mellitus with hyperglycemia: Secondary | ICD-10-CM

## 2023-08-13 DIAGNOSIS — Z794 Long term (current) use of insulin: Secondary | ICD-10-CM | POA: Diagnosis not present

## 2023-08-13 LAB — POCT GLYCOSYLATED HEMOGLOBIN (HGB A1C): Hemoglobin A1C: 6.8 % — AB (ref 4.0–5.6)

## 2023-08-13 MED ORDER — OZEMPIC (2 MG/DOSE) 8 MG/3ML ~~LOC~~ SOPN: 2.0000 mg | PEN_INJECTOR | SUBCUTANEOUS | 1 refills | Status: DC

## 2023-08-13 MED ORDER — INSULIN LISPRO (1 UNIT DIAL) 100 UNIT/ML (KWIKPEN): 4.0000 [IU] | PEN_INJECTOR | Freq: Three times a day (TID) | SUBCUTANEOUS | 3 refills | Status: DC

## 2023-08-13 MED ORDER — TRESIBA FLEXTOUCH 100 UNIT/ML ~~LOC~~ SOPN: 30.0000 [IU] | PEN_INJECTOR | Freq: Every day | SUBCUTANEOUS | 3 refills | Status: DC

## 2023-08-13 NOTE — Progress Notes (Signed)
 COVID Vaccine Completed: yes  Date of COVID positive in last 90 days:  PCP - Estevan Oaks, MD Cardiologist - Nona Dell, MD LOV 07/31/23 Nephrologist- Dr. Wolfgang Phoenix Neurology- Levert Feinstein, MD LOV 08/09/23  Cardiac clearance by Dr. Diona Browner 07/31/23 in Epic   Chest x-ray -  EKG - 07/31/23 Epic Stress Test -  ECHO - 01/20/19 Epic Cardiac Cath - 06/01/15 Epic Pacemaker/ICD device last checked: Spinal Cord Stimulator:  Bowel Prep -   Sleep Study -  CPAP -   Fasting Blood Sugar -  Checks Blood Sugar _____ times a day  Last dose of GLP1 agonist-  Ozempic  GLP1 instructions:  Hold 7 days before surgery    Last dose of SGLT-2 inhibitors-  N/A SGLT-2 instructions:  Hold 3 days before surgery    Blood Thinner Instructions:  Last dose:   Time: Aspirin Instructions: Last Dose:  Activity level:  Can go up a flight of stairs and perform activities of daily living without stopping and without symptoms of chest pain or shortness of breath.  Able to exercise without symptoms  Unable to go up a flight of stairs without symptoms of     Anesthesia review: HTN, CVA, TIA, CHF, COPD, DM2, ESRD dialysis MWF, anemia, need colonoscopy pre surgery per Dr. Clearance Coots  Patient denies shortness of breath, fever, cough and chest pain at PAT appointment  Patient verbalized understanding of instructions that were given to them at the PAT appointment. Patient was also instructed that they will need to review over the PAT instructions again at home before surgery.

## 2023-08-13 NOTE — Patient Instructions (Signed)
 SURGICAL WAITING ROOM VISITATION  Patients having surgery or a procedure may have no more than 2 support people in the waiting area - these visitors may rotate.    Children under the age of 36 must have an adult with them who is not the patient.  Due to an increase in RSV and influenza rates and associated hospitalizations, children ages 43 and under may not visit patients in [facility] hospitals.  Visitors with respiratory illnesses are discouraged from visiting and should remain at home.  If the patient needs to stay at the hospital during part of their recovery, the visitor guidelines for inpatient rooms apply. Pre-op nurse will coordinate an appropriate time for 1 support person to accompany patient in pre-op.  This support person may not rotate.    Please refer to the [facility] website for the visitor guidelines for Inpatients (after your surgery is over and you are in a regular room).    Your procedure is scheduled on: 08/23/23   Report to [facility] Main Entrance    Report to admitting at 7:45 AM   Call this number if you have problems the morning of surgery [phone]   Do not eat food :After Midnight.   After Midnight you may have the following liquids until 7:00 AM DAY OF SURGERY  Water Non-Citrus Juices (without pulp, NO RED-Apple, White grape, White cranberry) Black Coffee (NO MILK/CREAM OR CREAMERS, sugar ok)  Clear Tea (NO MILK/CREAM OR CREAMERS, sugar ok) regular and decaf                             Plain Jell-O (NO RED)                                           Fruit ices (not with fruit pulp, NO RED)                                     Popsicles (NO RED)                                                               Sports drinks like Gatorade (NO RED)                      If you have questions, please contact your surgeon's office.   FOLLOW BOWEL PREP AND ANY ADDITIONAL PRE OP INSTRUCTIONS YOU RECEIVED FROM YOUR SURGEON'S OFFICE!!!     Oral  Hygiene is also important to reduce your risk of infection.                                    Remember - BRUSH YOUR TEETH THE MORNING OF SURGERY WITH YOUR REGULAR TOOTHPASTE  DENTURES WILL BE REMOVED PRIOR TO SURGERY PLEASE DO NOT APPLY "Poly grip" OR ADHESIVES!!!   Do NOT smoke after Midnight   Stop all vitamins and herbal supplements 7 days before surgery.   Take these medicines the morning of  surgery with A SIP OF WATER: Amlodipine, Clonidine, Fluoxetine, Gabapentin, Omeprazole   DO NOT TAKE ANY ORAL DIABETIC MEDICATIONS DAY OF YOUR SURGERY  How to Manage Your Diabetes Before and After Surgery  Why is it important to control my blood sugar before and after surgery? Improving blood sugar levels before and after surgery helps healing and can limit problems. A way of improving blood sugar control is eating a healthy diet by:  Eating less sugar and carbohydrates  Increasing activity/exercise  Talking with your doctor about reaching your blood sugar goals High blood sugars (greater than 180 mg/dL) can raise your risk of infections and slow your recovery, so you will need to focus on controlling your diabetes during the weeks before surgery. Make sure that the doctor who takes care of your diabetes knows about your planned surgery including the date and location.  How do I manage my blood sugar before surgery? Check your blood sugar at least 4 times a day, starting 2 days before surgery, to make sure that the level is not too high or low. Check your blood sugar the morning of your surgery when you wake up and every 2 hours until you get to the Short Stay unit. If your blood sugar is less than 70 mg/dL, you will need to treat for low blood sugar: Do not take insulin. Treat a low blood sugar (less than 70 mg/dL) with  cup of clear juice (cranberry or apple), 4 glucose tablets, OR glucose gel. Recheck blood sugar in 15 minutes after treatment (to make sure it is greater than 70 mg/dL). If  your blood sugar is not greater than 70 mg/dL on recheck, call [phone] for further instructions. Report your blood sugar to the short stay nurse when you get to Short Stay.  If you are admitted to the hospital after surgery: Your blood sugar will be checked by the staff and you will probably be given insulin after surgery (instead of oral diabetes medicines) to make sure you have good blood sugar levels. The goal for blood sugar control after surgery is 80-180 mg/dL.   WHAT DO I DO ABOUT MY DIABETES MEDICATION?  Do not take oral diabetes medicines (pills) the morning of surgery.  THE DAY BEFORE SURGERY, take     units of       insulin.      THE MORNING OF SURGERY, take   units of         insulin.  DO NOT TAKE THE FOLLOWING 7 DAYS PRIOR TO SURGERY: Ozempic, Wegovy, Rybelsus (Semaglutide), Byetta (exenatide), Bydureon (exenatide ER), Victoza, Saxenda (liraglutide), or Trulicity (dulaglutide) Mounjaro (Tirzepatide) Adlyxin (Lixisenatide), Polyethylene Glycol Loxenatide.  If your CBG is greater than 220 mg/dL, you may take  of your sliding scale  (correction) dose of insulin.  Reviewed and Endorsed by [facility] Patient Education Committee, August 2015  Bring CPAP mask and tubing day of surgery.                              You may not have any metal on your body including hair pins, jewelry, and body piercing             Do not wear make-up, lotions, powders, perfumes, or deodorant  Do not wear nail polish including gel and S&S, artificial/acrylic nails, or any other type of covering on natural nails including finger and toenails. If you have artificial nails, gel coating, etc. that needs  to be removed by a nail salon please have this removed prior to surgery or surgery may need to be canceled/ delayed if the surgeon/ anesthesia feels like they are unable to be safely monitored.   Do not shave  48 hours prior to surgery.    Do not bring valuables to the hospital. [facility] IS  NOT             RESPONSIBLE   FOR VALUABLES.   Contacts, glasses, dentures or bridgework may not be worn into surgery.   Bring small overnight bag day of surgery.   DO NOT BRING YOUR HOME MEDICATIONS TO THE HOSPITAL. PHARMACY WILL DISPENSE MEDICATIONS LISTED ON YOUR MEDICATION LIST TO YOU DURING YOUR ADMISSION IN THE HOSPITAL!   Special Instructions: Bring a copy of your healthcare power of attorney and living will documents the day of surgery if you haven't scanned them before.              Please read over the following fact sheets you were given: IF YOU HAVE QUESTIONS ABOUT YOUR PRE-OP INSTRUCTIONS PLEASE CALL [phone] [place]    If you received a COVID test during your pre-op visit  it is requested that you wear a mask when out in public, stay away from anyone that may not be feeling well and notify your surgeon if you develop symptoms. If you test positive for Covid or have been in contact with anyone that has tested positive in the last 10 days please notify you surgeon.    [facility] - Preparing for Surgery Before surgery, you can play an important role.  Because skin is not sterile, your skin needs to be as free of germs as possible.  You can reduce the number of germs on your skin by washing with CHG (chlorahexidine gluconate) soap before surgery.  CHG is an antiseptic cleaner which kills germs and bonds with the skin to continue killing germs even after washing. Please DO NOT use if you have an allergy to CHG or antibacterial soaps.  If your skin becomes reddened/irritated stop using the CHG and inform your nurse when you arrive at Short Stay. Do not shave (including legs and underarms) for at least 48 hours prior to the first CHG shower.  You may shave your face/neck.  Please follow these instructions carefully:  1.  Shower with CHG Soap the night before surgery and the  morning of surgery.  2.  If you choose to wash your hair, wash your hair first as usual with your normal   shampoo.  3.  After you shampoo, rinse your hair and body thoroughly to remove the shampoo.                             4.  Use CHG as you would any other liquid soap.  You can apply chg directly to the skin and wash.  Gently with a scrungie or clean washcloth.  5.  Apply the CHG Soap to your body ONLY FROM THE NECK DOWN.   Do   not use on face/ open                           Wound or open sores. Avoid contact with eyes, ears mouth and   genitals (private parts).  Wash face,  Genitals (private parts) with your normal soap.             6.  Wash thoroughly, paying special attention to the area where your    surgery  will be performed.  7.  Thoroughly rinse your body with warm water from the neck down.  8.  DO NOT shower/wash with your normal soap after using and rinsing off the CHG Soap.                9.  Pat yourself dry with a clean towel.            10.  Wear clean pajamas.            11.  Place clean sheets on your bed the night of your first shower and do not  sleep with pets. Day of Surgery : Do not apply any lotions/deodorants the morning of surgery.  Please wear clean clothes to the hospital/surgery center.  FAILURE TO FOLLOW THESE INSTRUCTIONS MAY RESULT IN THE CANCELLATION OF YOUR SURGERY  PATIENT SIGNATURE_________________________________  NURSE SIGNATURE__________________________________  ________________________________________________________________________ WHAT IS A BLOOD TRANSFUSION? Blood Transfusion Information  A transfusion is the replacement of blood or some of its parts. Blood is made up of multiple cells which provide different functions. Red blood cells carry oxygen and are used for blood loss replacement. White blood cells fight against infection. Platelets control bleeding. Plasma helps clot blood. Other blood products are available for specialized needs, such as hemophilia or other clotting disorders. BEFORE THE TRANSFUSION  Who gives  blood for transfusions?  Healthy volunteers who are fully evaluated to make sure their blood is safe. This is blood bank blood. Transfusion therapy is the safest it has ever been in the practice of medicine. Before blood is taken from a donor, a complete history is taken to make sure that person has no history of diseases nor engages in risky social behavior (examples are intravenous drug use or sexual activity with multiple partners). The donor's travel history is screened to minimize risk of transmitting infections, such as malaria. The donated blood is tested for signs of infectious diseases, such as HIV and hepatitis. The blood is then tested to be sure it is compatible with you in order to minimize the chance of a transfusion reaction. If you or a relative donates blood, this is often done in anticipation of surgery and is not appropriate for emergency situations. It takes many days to process the donated blood. RISKS AND COMPLICATIONS Although transfusion therapy is very safe and saves many lives, the main dangers of transfusion include:  Getting an infectious disease. Developing a transfusion reaction. This is an allergic reaction to something in the blood you were given. Every precaution is taken to prevent this. The decision to have a blood transfusion has been considered carefully by your caregiver before blood is given. Blood is not given unless the benefits outweigh the risks. AFTER THE TRANSFUSION Right after receiving a blood transfusion, you will usually feel much better and more energetic. This is especially true if your red blood cells have gotten low (anemic). The transfusion raises the level of the red blood cells which carry oxygen, and this usually causes an energy increase. The nurse administering the transfusion will monitor you carefully for complications. HOME CARE INSTRUCTIONS  No special instructions are needed after a transfusion. You may find your energy is better. Speak with  your caregiver about any limitations on activity for underlying diseases you may  have. SEEK MEDICAL CARE IF:  Your condition is not improving after your transfusion. You develop redness or irritation at the intravenous (IV) site. SEEK IMMEDIATE MEDICAL CARE IF:  Any of the following symptoms occur over the next 12 hours: Shaking chills. You have a temperature by mouth above 102 F (38.9 C), not controlled by medicine. Chest, back, or muscle pain. People around you feel you are not acting correctly or are confused. Shortness of breath or difficulty breathing. Dizziness and fainting. You get a rash or develop hives. You have a decrease in urine output. Your urine turns a dark color or changes to pink, red, or brown. Any of the following symptoms occur over the next 10 days: You have a temperature by mouth above 102 F (38.9 C), not controlled by medicine. Shortness of breath. Weakness after normal activity. The white part of the eye turns yellow (jaundice). You have a decrease in the amount of urine or are urinating less often. Your urine turns a dark color or changes to pink, red, or brown. Document Released: 05/19/2000 Document Revised: 08/14/2011 Document Reviewed: 01/06/2008 [facility] [employer] Patient Information 2014 [place], [place].  _______________________________________________________________________

## 2023-08-13 NOTE — Progress Notes (Signed)
 Endocrinology Follow Up Visit      08/13/2023, 1:33 PM   Subjective:    Patient ID: [name], female    DOB: [birth year].  Samantha Pierce is being seen in follow up after being seen in consultation for management of currently uncontrolled symptomatic diabetes requested by  Estevan Oaks, NP.   Past Medical History:  Diagnosis Date   Anemia    Anxiety    Asystole (HCC)    a. 05/2015: pt developed bradycardia->asystole during Lexiscan nuclear stress test, s/p brief code. Cath with only 30% prox LCx. Her asystole during Lexiscan infusion was felt to represent an excessive pharmacologic response to the agent and likely superimposed vasovagal response.   Bipolar disorder (HCC)    CKD (chronic kidney disease) stage 4, GFR 15-29 ml/min (HCC)    M_W_F dialysis   COPD (chronic obstructive pulmonary disease) (HCC)    Depression    Essential hypertension    GERD (gastroesophageal reflux disease)    History of blood transfusion 2011   History of seizure 2017   only one seizure - none since   History of stroke 09/2014   Acute lacunar infarct of the left thalamus   Leg cramps    Mild CAD    a. LHC 05/2015: 30% prox Cx, otherwise widely patent.   Morbid obesity (HCC)    Neuromuscular disorder (HCC)    neuropathy in feet   Neuropathy    feet   PTSD (post-traumatic stress disorder)    Retinal detachment    legally blind in right eye   Stroke (HCC)    x 2, right side weakness   Type 2 diabetes mellitus (HCC)     Past Surgical History:  Procedure Laterality Date   AV FISTULA PLACEMENT Right 01/08/2017   Procedure: RIGHT ARM BRACHIOCEPHALIC ARTERIOVENOUS (AV) FISTULA CREATION;  Surgeon: Maeola Harman, MD;  Location: [facility] OR;  Service: Vascular;  Laterality: Right;   AV FISTULA PLACEMENT Left 11/20/2018   Procedure: Creation of LEFT ARM ARTERIOVENOUS (AV) FISTULA;  Surgeon: Maeola Harman, MD;  Location: [facility] OR;  Service: Vascular;  Laterality: Left;   AV FISTULA PLACEMENT Right 02/24/2021   Procedure: RIGHT UPPER ARM BASILIC VEIN ARTERIOVENOUS (AV) FISTULA CREATION;  Surgeon: Leonie Douglas, MD;  Location: [facility] OR;  Service: Vascular;  Laterality: Right;   AV FISTULA PLACEMENT Right 05/05/2021   Procedure: INSERTION OF ARTERIOVENOUS (AV) GORE-TEX GRAFT ARM;  Surgeon: Leonie Douglas, MD;  Location: [facility] OR;  Service: Vascular;  Laterality: Right;   BASCILIC VEIN TRANSPOSITION Left 02/11/2019   Procedure: BASILIC VEIN TRANSPOSITION SECOND STAGE;  Surgeon: Maeola Harman, MD;  Location: [facility] OR;  Service: Vascular;  Laterality: Left;   CARDIAC CATHETERIZATION N/A 06/01/2015   Procedure: Left Heart Cath and Coronary Angiography;  Surgeon: Lyn Records, MD; CFX 30%, no other dz, EF nl by echo   CESAREAN SECTION     x2   COLONOSCOPY WITH PROPOFOL N/A 07/27/2022   Procedure: COLONOSCOPY WITH PROPOFOL;  Surgeon: Lanelle Bal, DO;  Location: [facility] SUITE;  Service: Endoscopy;  Laterality: N/A;  930am, asa 3, dialysis pt   DILATION AND CURETTAGE OF UTERUS  EYE SURGERY     Right eye pars plano vitrectomy    HEMATOMA EVACUATION Left 03/03/2019   Procedure: EVACUATION HEMATOMA;  Surgeon: Sherren Kerns, MD;  Location: [facility] OR;  Service: Vascular;  Laterality: Left;   INSERTION OF DIALYSIS CATHETER Right 01/04/2021   Procedure: INSERTION OF RIGHT INTERNAL JUGULAR TUNNELED DIALYSIS CATHETER;  Surgeon: Sherren Kerns, MD;  Location: [facility] OR;  Service: Vascular;  Laterality: Right;   LASIK     LIGATION OF ARTERIOVENOUS  FISTULA Left 01/04/2021   Procedure: LIGATION OF LEFT ARM ARTERIOVENOUS  FISTULA;  Surgeon: Sherren Kerns, MD;  Location: [facility] OR;  Service: Vascular;  Laterality: Left;   PARS PLANA VITRECTOMY  04/20/2011   Procedure: PARS PLANA VITRECTOMY WITH 25 GAUGE;  Surgeon: Sherrie George, MD;  Location: [facility] OR;  Service: Ophthalmology;  Laterality: Left;   membrane peel, gas fluid exchange, endolaser, repair of complex retinal detachment   PATCH ANGIOPLASTY Left 10/05/2020   Procedure: PATCH ANGIOPLASTY USING Livia Snellen BIOLOGIC PATCH;  Surgeon: Sherren Kerns, MD;  Location: [facility] OR;  Service: Vascular;  Laterality: Left;   PERIPHERAL VASCULAR BALLOON ANGIOPLASTY Left 09/02/2020   Procedure: PERIPHERAL VASCULAR BALLOON ANGIOPLASTY;  Surgeon: Cephus Shelling, MD;  Location: [facility] INVASIVE CV LAB;  Service: Cardiovascular;  Laterality: Left;  arm fistula   REMOVAL OF A DIALYSIS CATHETER N/A 06/21/2021   Procedure: MINOR REMOVAL OF TUNNELED DIALYSIS CATHETER;  Surgeon: Larina Earthly, MD;  Location: [facility] ORS;  Service: Vascular;  Laterality: N/A;   REVISON OF ARTERIOVENOUS FISTULA Left 10/05/2020   Procedure: REVISION OF LEFT ARM ARTERIOVENOUS FISTULA;  Surgeon: Sherren Kerns, MD;  Location: [facility] OR;  Service: Vascular;  Laterality: Left;   TUBAL LIGATION      Social History   Socioeconomic History   Marital status: Single    Spouse name: Not on file   Number of children: Not on file   Years of education: Not on file   Highest education level: Not on file  Occupational History   Not on file  Tobacco Use   Smoking status: Every Day    Current packs/day: 0.00    Average packs/day: (1.2 ttl pk-yrs)    Types: Cigarettes    Start date: 06/05/1981    Last attempt to quit: 06/2022    Years since quitting: 1.1   Smokeless tobacco: Never   Tobacco comments:    Quit in 12/2018 but now smokes off and on per patient 05/04/21.  Vaping Use   Vaping status: Never Used  Substance and Sexual Activity   Alcohol use: Yes    Comment: occ wine   Drug use: No   Sexual activity: Yes    Birth control/protection: Surgical    Comment: Tubal Ligation  Other Topics Concern   Not on file  Social History Narrative   Not on file   Social Drivers of Health   Financial Resource Strain: Not on file  Food Insecurity: Food Insecurity Present (07/24/2023)   Hunger  Vital Sign    Worried About Running Out of Food in the Last Year: Sometimes true    Ran Out of Food in the Last Year: Sometimes true  Transportation Needs: No Transportation Needs (07/24/2023)   PRAPARE - Administrator, Civil Service (Medical): No    Lack of Transportation (Non-Medical): No  Physical Activity: Not on file  Stress: Not on file  Social Connections: Not on file    Family History  Problem Relation Age  of Onset   Diabetes Mother    Kidney failure Mother    Diabetes Father    Amblyopia Neg Hx    Blindness Neg Hx    Cataracts Neg Hx    Glaucoma Neg Hx    Macular degeneration Neg Hx    Retinal detachment Neg Hx    Strabismus Neg Hx    Retinitis pigmentosa Neg Hx     Outpatient Encounter Medications as of 08/13/2023  Medication Sig   amLODipine (NORVASC) 10 MG tablet Take 1 tablet (10 mg total) by mouth daily.   atorvastatin (LIPITOR) 80 MG tablet Take 1 tablet (80 mg total) by mouth daily at 6 PM. (Patient taking differently: Take 80 mg by mouth at bedtime.)   Continuous Blood Gluc Receiver (DEXCOM G6 RECEIVER) DEVI USE TO CHECK GLUCOSE FOUR TIMES DAILY AS DIRECTED.   diclofenac Sodium (VOLTAREN) 1 % GEL One pump tid prn   FLUoxetine (PROZAC) 40 MG capsule Take 40 mg by mouth daily.   furosemide (LASIX) 80 MG tablet Take 80 mg by mouth 2 (two) times daily.   gabapentin (NEURONTIN) 300 MG capsule TAKE (1) CAPSULE BY MOUTH DAILY. (Patient taking differently: Take 900 mg by mouth 2 (two) times daily.)   hydrALAZINE (APRESOLINE) 50 MG tablet Take 50 mg by mouth in the morning and at bedtime.   hydrOXYzine (ATARAX) 10 MG tablet Take 10 mg by mouth 3 (three) times daily as needed for anxiety.   lactulose (CHRONULAC) 10 GM/15ML solution Take 10 g by mouth daily.   Lancets (ONETOUCH DELICA PLUS LANCET33G) MISC Apply topically.   lidocaine-prilocaine (EMLA) cream Apply 1 Application topically as needed. One pump tid prn   omeprazole (PRILOSEC OTC) 20 MG tablet Take  20 mg by mouth daily before breakfast.   Tenapanor HCl (IBSRELA) 50 MG TABS Take 50 mg by mouth daily.   traZODone (DESYREL) 100 MG tablet Take 100 mg by mouth at bedtime.   [DISCONTINUED] insulin lispro (HUMALOG KWIKPEN) 100 UNIT/ML KwikPen Inject 6-12 Units into the skin 3 (three) times daily.   [DISCONTINUED] Semaglutide, 2 MG/DOSE, (OZEMPIC, 2 MG/DOSE,) 8 MG/3ML SOPN INJECT 2 MG ONCE A WEEK AS DIRECTED   [DISCONTINUED] TRESIBA FLEXTOUCH 100 UNIT/ML FlexTouch Pen INJECT 40 UNITS INTO THE SKIN AT BEDTIME.   cloNIDine (CATAPRES) 0.2 MG tablet Take 0.2 mg by mouth 2 (two) times daily.   insulin degludec (TRESIBA FLEXTOUCH) 100 UNIT/ML FlexTouch Pen Inject 30 Units into the skin at bedtime.   insulin lispro (HUMALOG KWIKPEN) 100 UNIT/ML KwikPen Inject 4-10 Units into the skin 3 (three) times daily.   linaclotide (LINZESS) 290 MCG CAPS capsule Take 1 capsule (290 mcg total) by mouth daily before breakfast. (Patient not taking: Reported on 08/09/2023)   ONETOUCH VERIO test strip SMARTSIG:Via Meter (Patient not taking: Reported on 08/09/2023)   polyethylene glycol (MIRALAX) 17 g packet Take 17 g by mouth daily. (Patient not taking: Reported on 08/13/2023)   Semaglutide, 2 MG/DOSE, (OZEMPIC, 2 MG/DOSE,) 8 MG/3ML SOPN Inject 2 mg into the skin once a week.   [DISCONTINUED] amLODipine (NORVASC) 10 MG tablet 1 tablet Orally Once a day (Patient not taking: Reported on 08/13/2023)   [DISCONTINUED] cloNIDine (CATAPRES) 0.2 MG tablet 1 tablet Orally twice a day (Patient not taking: Reported on 08/09/2023)   [DISCONTINUED] FLUoxetine (PROZAC) 10 MG capsule Take 10 mg by mouth daily. (Patient not taking: Reported on 08/09/2023)   [DISCONTINUED] furosemide (LASIX) 80 MG tablet Take 80 mg by mouth 2 (two) times daily. (Patient not  taking: Reported on 08/09/2023)   [DISCONTINUED] gabapentin (NEURONTIN) 300 MG capsule 1 capsule po At bedtime (Patient not taking: Reported on 08/09/2023)   [DISCONTINUED] hydrALAZINE (APRESOLINE) 50  MG tablet Take 50 mg by mouth 2 (two) times daily. (Patient not taking: Reported on 08/09/2023)   [DISCONTINUED] hydrOXYzine (ATARAX) 10 MG tablet Take 10 mg by mouth 3 (three) times daily. (Patient not taking: Reported on 08/09/2023)   [DISCONTINUED] Metoprolol Succinate 50 MG CS24 as directed Orally (Patient not taking: Reported on 08/13/2023)   [DISCONTINUED] omeprazole (PRILOSEC) 20 MG capsule Take 20 mg by mouth daily before breakfast.  (Patient not taking: Reported on 08/09/2023)   No facility-administered encounter medications on file as of 08/13/2023.    ALLERGIES: Allergies  Allergen Reactions   Contrast Media [Iodinated Contrast Media] Anaphylaxis    "code blue--I died"   Penicillins Rash and Other (See Comments)    Childhood     VACCINATION STATUS: Immunization History  Administered Date(s) Administered   Influenza,trivalent, recombinat, inj, PF 07/05/2016   Influenza-Unspecified 08/26/2020   Moderna Sars-Covid-2 Vaccination 10/27/2019, 11/24/2019   Pneumococcal Polysaccharide-23 09/05/2014, 08/26/2020    Diabetes She presents for her follow-up diabetic visit. She has type 2 diabetes mellitus. Onset time: She was diagnosed at approximate age of 1. Her disease course has been improving. There are no hypoglycemic associated symptoms. Associated symptoms include blurred vision, fatigue and foot paresthesias. Pertinent negatives for diabetes include no polydipsia, no polyphagia, no polyuria and no weight loss. There are no hypoglycemic complications. Symptoms are stable. Diabetic complications include a CVA, heart disease, nephropathy, peripheral neuropathy and retinopathy. (Legally blind in right eye, says she was hospitalized for DKA once; now on HD) Risk factors for coronary artery disease include diabetes mellitus, dyslipidemia, hypertension, obesity, sedentary lifestyle and tobacco exposure. Current diabetic treatment includes intensive insulin program (and Ozempic). She is compliant  with treatment most of the time. Her weight is fluctuating minimally. She is following a generally healthy diet. When asked about meal planning, she reported none. She has not had a previous visit with a dietitian. She rarely participates in exercise. Her home blood glucose trend is decreasing steadily. Her overall blood glucose range is 130-140 mg/dl. (She presents today with her CGM showing at goal glycemic profile overall.  Her POCT A1c today is 6.8%, improving from last visit of 7.4%.  Analysis of her CGM shows TIR 74%, TAR 21%, TBR 5% with a GMI of 6.6%.  She notes she will be having hysterectomy soon for possible uterine cancer.  ) An ACE inhibitor/angiotensin II receptor blocker is not being taken. She does not see a podiatrist.Eye exam is current.  Hyperlipidemia This is a chronic problem. The current episode started more than 1 year ago. The problem is uncontrolled. Recent lipid tests were reviewed and are high. Exacerbating diseases include chronic renal disease, diabetes and obesity. Factors aggravating her hyperlipidemia include beta blockers and fatty foods. Current antihyperlipidemic treatment includes statins. The current treatment provides moderate improvement of lipids. Compliance problems include adherence to diet and adherence to exercise.  Risk factors for coronary artery disease include diabetes mellitus, dyslipidemia, hypertension, obesity and a sedentary lifestyle.  Hypertension This is a chronic problem. The current episode started more than 1 year ago. The problem has been gradually improving since onset. The problem is controlled. Associated symptoms include blurred vision. There are no associated agents to hypertension. Risk factors for coronary artery disease include diabetes mellitus, dyslipidemia, obesity, sedentary lifestyle and smoking/tobacco exposure. Past treatments include calcium channel  blockers, diuretics, central alpha agonists and beta blockers. The current treatment  provides mild improvement. There are no compliance problems.  Hypertensive end-organ damage includes kidney disease, CAD/MI, CVA, heart failure and retinopathy. Identifiable causes of hypertension include chronic renal disease.    Review of systems  Constitutional: + decreasing body weight,  current Body mass index is 38.35 kg/m. , + fatigue, no subjective hyperthermia, no subjective hypothermia Eyes: + blurry vision (diabetic retinopathy and legally blind in Right eye), no xerophthalmia ENT: no sore throat, no nodules palpated in throat, no dysphagia/odynophagia, no hoarseness Cardiovascular: no chest pain, no shortness of breath, no palpitations, no leg swelling Respiratory: no cough, no shortness of breath Gastrointestinal: no nausea/vomiting/diarrhea, + chronic constipation r/t gastroparesis Musculoskeletal: no muscle/joint aches Skin: no rashes, no hyperemia Neurological: no tremors, no dizziness, + numbness/tingling to BLE R>L Psychiatric: no depression, no anxiety   Objective:     BP 130/84 (BP Location: Left Arm, Patient Position: Sitting, Cuff Size: Large)   Pulse (!) 57   Ht 5\' 4"  (1.626 m)   Wt 223 lb 6.4 oz (101.3 kg)   LMP  (LMP Unknown)   BMI 38.35 kg/m   Wt Readings from Last 3 Encounters:  08/13/23 223 lb 6.4 oz (101.3 kg)  08/09/23 225 lb 3.2 oz (102.2 kg)  08/01/23 223 lb 3.2 oz (101.2 kg)    BP Readings from Last 3 Encounters:  08/13/23 130/84  08/09/23 (!) 141/78  08/01/23 123/71      Physical Exam- Limited  Constitutional:  Body mass index is 38.35 kg/m. , not in acute distress, normal state of mind Eyes:  EOMI, no exophthalmos Musculoskeletal: no gross deformities, strength intact in all four extremities, no gross restriction of joint movements Skin:  no rashes, no hyperemia Neurological: no tremor with outstretched hands   Diabetic Foot Exam - Simple   No data filed     CMP ( most recent) CMP     Component Value Date/Time   NA 136  06/25/2023 1354   K 3.8 06/25/2023 1354   CL 94 (L) 06/25/2023 1354   CO2 30 06/25/2023 1354   GLUCOSE 189 (H) 06/25/2023 1354   BUN 24 (H) 06/25/2023 1354   BUN 31 (A) 07/22/2021 0000   CREATININE 4.24 (H) 06/25/2023 1354   CALCIUM 9.0 06/25/2023 1354   PROT 7.6 06/25/2023 1354   ALBUMIN 3.8 06/25/2023 1354   AST 20 06/25/2023 1354   ALT 19 06/25/2023 1354   ALKPHOS 48 06/25/2023 1354   BILITOT 0.6 06/25/2023 1354   GFRNONAA 12 (L) 06/25/2023 1354   GFRAA 12 (L) 03/04/2019 0616     Diabetic Labs (most recent): Lab Results  Component Value Date   HGBA1C 6.8 (A) 08/13/2023   HGBA1C 7.4 (A) 01/16/2023   HGBA1C 7.5 (A) 09/07/2022     Lipid Panel ( most recent) Lipid Panel     Component Value Date/Time   CHOL 153 09/04/2022 1223   TRIG 122 09/04/2022 1223   HDL 55 09/04/2022 1223   CHOLHDL 2.8 09/04/2022 1223   CHOLHDL 5.4 05/23/2015 0545   VLDL 56 (H) 05/23/2015 0545   LDLCALC 76 09/04/2022 1223   LABVLDL 22 09/04/2022 1223      Lab Results  Component Value Date   TSH 0.524 09/04/2022   TSH 1.524 09/04/2014   FREET4 1.31 09/04/2022           Assessment & Plan:   1) Controlled diabetes mellitus with stage 4 chronic kidney disease (HCC)  She presents today with her CGM showing at goal glycemic profile overall.  Her POCT A1c today is 6.8%, improving from last visit of 7.4%.  Analysis of her CGM shows TIR 74%, TAR 21%, TBR 5% with a GMI of 6.6%.  She notes she will be having hysterectomy soon for possible uterine cancer.    - Samantha Pierce has currently uncontrolled symptomatic type 2 DM since 53 years of age.  -Recent labs reviewed.  - I had a long discussion with her about the progressive nature of diabetes and the pathology behind its complications. -her diabetes is complicated by CVA, retinopathy, neuropathy, CKD, and DKA x 1 episode and she remains at a high risk for more acute and chronic complications which include worsening CAD, CVA, CKD,  retinopathy, and neuropathy. These are all discussed in detail with her. - Nutritional counseling repeated at each appointment due to patients tendency to fall back in to old habits.  - The patient admits there is a room for improvement in their diet and drink choices. -  Suggestion is made for the patient to avoid simple carbohydrates from their diet including Cakes, Sweet Desserts / Pastries, Ice Cream, Soda (diet and regular), Sweet Tea, Candies, Chips, Cookies, Sweet Pastries, Store Bought Juices, Alcohol in Excess of 1-2 drinks a day, Artificial Sweeteners, Coffee Creamer, and "Sugar-free" Products. This will help patient to have stable blood glucose profile and potentially avoid unintended weight gain.   - I encouraged the patient to switch to unprocessed or minimally processed complex starch and increased protein intake (animal or plant source), fruits, and vegetables.   - Patient is advised to stick to a routine mealtimes to eat 3 meals a day and avoid unnecessary snacks (to snack only to correct hypoglycemia).  - she will be scheduled with Norm Salt, RDN, CDE for diabetes education.  - I have approached her with the following individualized plan to manage  her diabetes and patient agrees:   -She is advised to lower her dose of Tresiba to 30 units SQ nightly and lower her Novolog  to 4-10 units TID with meals if glucose is above 90 and she is eating (Specific instructions on how to titrate insulin dosage based on glucose readings given to patient in writing). She can also continue Ozempic 2 mg SQ weekly.  We talked about potentially reducing this moving forward given her constipation but she says she wants to continue for now due to the positive affect on her glucose readings.  She is on lactulose for constipation.  -she is encouraged to continue monitoring blood glucose 4 times daily (using her CGM), before meals and before bed, and to call the clinic if she has readings less than 70 or  greater than 300 for 3 tests in a row.   - she is warned not to take insulin without proper monitoring per orders. - Adjustment parameters are given to her for hypo and hyperglycemia in writing.  - she is not a candidate for Metformin, SGLT2 due to concurrent renal insufficiency.  - Specific targets for  A1c;  LDL, HDL,  and Triglycerides were discussed with the patient.  2) Blood Pressure /Hypertension:  her blood pressure is controlled to target.   she is advised to continue her current medications including Norvasc 10 mg po daily, Clonidine 0.2 mg po daily, Lasix 80 mg po twice daily, Hydralazine 50 mg po twice daily and Metoprolol 50 mg  mg p.o. daily with breakfast.  Will defer any med changes  to nephrology.  3) Lipids/Hyperlipidemia:    Her recent lipid panel from 09/04/22 shows controlled LDL of 76.  She is advised to continue Lipitor 80 mg po daily before bed.  Side effects and precautions discussed with her.    4)  Weight/Diet:  her Body mass index is 38.35 kg/m.  -  clearly complicating her diabetes care.   she is a candidate for weight loss. I discussed with her the fact that loss of 5 - 10% of her  current body weight will have the most impact on her diabetes management.  Exercise, and detailed carbohydrates information provided  -  detailed on discharge instructions.  5) Chronic Care/Health Maintenance: -she is not on ACEI/ARB and is on Statin medications and is encouraged to initiate and continue to follow up with Ophthalmology, Dentist, Podiatrist at least yearly or according to recommendations, and advised to stay away from smoking. I have recommended yearly flu vaccine and pneumonia vaccine at least every 5 years; moderate intensity exercise for up to 150 minutes weekly; and sleep for at least 7 hours a day.  - she is advised to maintain close follow up with Estevan Oaks, NP for primary care needs, as well as her other providers for optimal and coordinated  care.      I spent  30  minutes in the care of the patient today including review of labs from CMP, Lipids, Thyroid Function, Hematology (current and previous including abstractions from other facilities); face-to-face time discussing  her blood glucose readings/logs, discussing hypoglycemia and hyperglycemia episodes and symptoms, medications doses, her options of short and long term treatment based on the latest standards of care / guidelines;  discussion about incorporating lifestyle medicine;  and documenting the encounter. Risk reduction counseling performed per USPSTF guidelines to reduce obesity and cardiovascular risk factors.     Please refer to Patient Instructions for Blood Glucose Monitoring and Insulin/Medications Dosing Guide"  in media tab for additional information. Please  also refer to " Patient Self Inventory" in the Media  tab for reviewed elements of pertinent patient history.  Samantha Pierce participated in the discussions, expressed understanding, and voiced agreement with the above plans.  All questions were answered to her satisfaction. she is encouraged to contact clinic should she have any questions or concerns prior to her return visit.   Follow up plan: - Return in about 4 months (around 12/13/2023) for Diabetes F/U with A1c in office, No previsit labs, Bring meter and logs.  Ronny Bacon, [facility] [address] [place], [place] [address] Phone: [phone] Fax: [phone]  08/13/2023, 1:33 PM

## 2023-08-14 ENCOUNTER — Encounter (HOSPITAL_COMMUNITY): Payer: Self-pay

## 2023-08-14 ENCOUNTER — Other Ambulatory Visit: Payer: Self-pay

## 2023-08-14 ENCOUNTER — Encounter (HOSPITAL_COMMUNITY)
Admission: RE | Admit: 2023-08-14 | Discharge: 2023-08-14 | Disposition: A | Discharge status: Home / Self Care | Payer: 59 | Source: Ambulatory Visit | Attending: Gynecologic Oncology | Admitting: Gynecologic Oncology

## 2023-08-14 VITALS — BP 138/77 | HR 82 | Temp 98.2°F | Resp 16 | Ht 64.0 in

## 2023-08-14 DIAGNOSIS — Z01812 Encounter for preprocedural laboratory examination: Secondary | ICD-10-CM | POA: Diagnosis present

## 2023-08-14 DIAGNOSIS — R1900 Intra-abdominal and pelvic swelling, mass and lump, unspecified site: Secondary | ICD-10-CM | POA: Insufficient documentation

## 2023-08-14 DIAGNOSIS — I2510 Atherosclerotic heart disease of native coronary artery without angina pectoris: Secondary | ICD-10-CM | POA: Diagnosis not present

## 2023-08-14 DIAGNOSIS — Z992 Dependence on renal dialysis: Secondary | ICD-10-CM | POA: Diagnosis not present

## 2023-08-14 DIAGNOSIS — Z794 Long term (current) use of insulin: Secondary | ICD-10-CM | POA: Diagnosis not present

## 2023-08-14 DIAGNOSIS — I132 Hypertensive heart and chronic kidney disease with heart failure and with stage 5 chronic kidney disease, or end stage renal disease: Secondary | ICD-10-CM | POA: Insufficient documentation

## 2023-08-14 DIAGNOSIS — N186 End stage renal disease: Secondary | ICD-10-CM | POA: Diagnosis not present

## 2023-08-14 DIAGNOSIS — N9489 Other specified conditions associated with female genital organs and menstrual cycle: Secondary | ICD-10-CM

## 2023-08-14 DIAGNOSIS — I509 Heart failure, unspecified: Secondary | ICD-10-CM | POA: Insufficient documentation

## 2023-08-14 DIAGNOSIS — E1165 Type 2 diabetes mellitus with hyperglycemia: Secondary | ICD-10-CM | POA: Insufficient documentation

## 2023-08-14 DIAGNOSIS — Z8673 Personal history of transient ischemic attack (TIA), and cerebral infarction without residual deficits: Secondary | ICD-10-CM | POA: Insufficient documentation

## 2023-08-14 DIAGNOSIS — J449 Chronic obstructive pulmonary disease, unspecified: Secondary | ICD-10-CM | POA: Insufficient documentation

## 2023-08-14 HISTORY — DX: Heart failure, unspecified: I50.9

## 2023-08-14 LAB — HEMOGLOBIN A1C
Hgb A1c MFr Bld: 6.8 % — ABNORMAL HIGH (ref 4.8–5.6)
Mean Plasma Glucose: 148.46 mg/dL

## 2023-08-14 LAB — BASIC METABOLIC PANEL
Anion gap: 14 (ref 5–15)
BUN: 36 mg/dL — ABNORMAL HIGH (ref 6–20)
CO2: 25 mmol/L (ref 22–32)
Calcium: 9 mg/dL (ref 8.9–10.3)
Chloride: 96 mmol/L — ABNORMAL LOW (ref 98–111)
Creatinine, Ser: 5.82 mg/dL — ABNORMAL HIGH (ref 0.44–1.00)
GFR, Estimated: 8 mL/min — ABNORMAL LOW (ref >60)
Glucose, Bld: 100 mg/dL — ABNORMAL HIGH (ref 70–99)
Potassium: 3.7 mmol/L (ref 3.5–5.1)
Sodium: 135 mmol/L (ref 135–145)

## 2023-08-14 LAB — CBC
HCT: 39.3 % (ref 36.0–46.0)
Hemoglobin: 12.7 g/dL (ref 12.0–15.0)
MCH: 27.4 pg (ref 26.0–34.0)
MCHC: 32.3 g/dL (ref 30.0–36.0)
MCV: 84.7 fL (ref 80.0–100.0)
Platelets: 250 10*3/uL (ref 150–400)
RBC: 4.64 MIL/uL (ref 3.87–5.11)
RDW: 13.1 % (ref 11.5–15.5)
WBC: 7.4 10*3/uL (ref 4.0–10.5)
nRBC: 0 % (ref 0.0–0.2)

## 2023-08-14 LAB — GLUCOSE, CAPILLARY: Glucose-Capillary: 147 mg/dL — ABNORMAL HIGH (ref 70–99)

## 2023-08-14 NOTE — Progress Notes (Signed)
 Patient states she is also supposed to have a hernia repair with Dr. Sheliah Hatch. She has not seen him yet. Has an appointment scheduled on Thursday. Will need consent form signed DOS.

## 2023-08-14 NOTE — Telephone Encounter (Signed)
 Received PCP clearance.

## 2023-08-15 NOTE — Anesthesia Preprocedure Evaluation (Addendum)
 Anesthesia Evaluation  Patient identified by MRN, date of birth, ID band Patient awake    Reviewed: Allergy & Precautions, NPO status , Patient's Chart, lab work & pertinent test results  Airway Mallampati: III  TM Distance: >3 FB Neck ROM: Full    Dental no notable dental hx.    Pulmonary COPD, Current Smoker   Pulmonary exam normal        Cardiovascular hypertension, Pt. on medications + CAD and +CHF   Rhythm:Regular Rate:Normal  Echo 01/20/2019  1. The left ventricle has normal systolic function with an ejection  fraction of 60-65%. The cavity size was normal. There is moderately  increased left ventricular wall thickness. Left ventricular diastolic  Doppler parameters are indeterminate.   2. The right ventricle has normal systolic function. The cavity was  normal. There is no increase in right ventricular wall thickness.   3. Left atrial size was severely dilated.   4. Right atrial size was mildly dilated.   5. No evidence of mitral valve stenosis.   6. The aortic valve is tricuspid. No stenosis of the aortic valve.   7. The aorta is normal unless otherwise noted.   8. There is dilatation of the aortic root.   9. Pulmonary hypertension is indeterminate, inadequate TR jet.  10. The inferior vena cava was dilated in size with >50% respiratory  variability.     Neuro/Psych   Anxiety Depression Bipolar Disorder   TIACVA    GI/Hepatic ,GERD  Medicated,,  Endo/Other  diabetes, Type 2, Insulin Dependent  Class 3 obesity  Renal/GU ESRF and DialysisRenal diseaseLab Results      Component                Value               Date                      NA                       135                 08/14/2023                K                        3.7                 08/14/2023                CO2                      25                  08/14/2023                GLUCOSE                  100 (H)             08/14/2023                 BUN                      36 (H)              08/14/2023  CREATININE               5.82 (H)            08/14/2023                CALCIUM                  9.0                 08/14/2023                GFRNONAA                 8 (L)               08/14/2023             Female GU complaint     Musculoskeletal negative musculoskeletal ROS (+)    Abdominal Normal abdominal exam  (+)   Peds  Hematology  (+) Blood dyscrasia, anemia Lab Results      Component                Value               Date                      WBC                      7.4                 08/14/2023                HGB                      12.7                08/14/2023                HCT                      39.3                08/14/2023                MCV                      84.7                08/14/2023                PLT                      250                 08/14/2023              Anesthesia Other Findings   Reproductive/Obstetrics                             Anesthesia Physical Anesthesia Plan  ASA: 3  Anesthesia Plan: General   Post-op Pain Management: Celebrex PO (pre-op)* and Tylenol PO (pre-op)*   Induction: Intravenous  PONV Risk Score and Plan: 2 and Ondansetron, Dexamethasone, Midazolam and Treatment may vary due to age or medical condition  Airway Management Planned: Mask and Oral ETT  Additional Equipment: None  Intra-op Plan:  Post-operative Plan: Extubation in OR  Informed Consent: I have reviewed the patients History and Physical, chart, labs and discussed the procedure including the risks, benefits and alternatives for the proposed anesthesia with the patient or authorized representative who has indicated his/her understanding and acceptance.     Dental advisory given  Plan Discussed with: CRNA  Anesthesia Plan Comments: (See PAT note 08/14/23)       Anesthesia Quick Evaluation

## 2023-08-15 NOTE — Telephone Encounter (Signed)
 Received neuro clearance

## 2023-08-15 NOTE — Progress Notes (Signed)
 Anesthesia Chart Review   Case: [id number] Date/Time: 08/23/23 0945   Procedures:      HYSTERECTOMY, TOTAL, ROBOT-ASSISTED, LAPAROSCOPIC, WITH BILATERAL SALPINGO-OOPHORECTOMY (Bilateral)     POSSIBLE LAPAROTOMY WITH POSSIBLE STAGING   Anesthesia type: General   Pre-op diagnosis: COMPLEX ADNEXAL MASS   [facility] 05 / [facility]   Surgeons: Carver Fila, MD       DISCUSSION:53 y.o. smoker with h/o HTN, COPD, Stroke, CAD, CHF, DM II (A1C 6.8), ESRD on dialysis MWF, complex adnexal mass scheduled for above procedure 08/23/2023 with Dr. Eugene Garnet.   06/01/15 she developed bradycardia followed by asystole during a Lexiscan nuclear stress test requiring brief CODE. Cardiac cath showed only 30% LCX disease, so response felt likely "excessive pharmacologic response to the agent and likely superimposed vasovagal response."   Pt last seen by cardiology 07/31/23. Per OV note, " History of mild CAD by cardiac catheterization in December 2016.  She does not report any angina, ECG reviewed showing sinus rhythm with left anterior fascicular block (had leftward axis last year as well). She is being considered for robotic assisted total laparoscopic hysterectomy and bilateral salpingo-oophorectomy with possible laparoscopic staging per Dr. Pricilla Holm.  Plan is general anesthesia.  As discussed above, RCRI perioperative cardiac risk index indicates 15% chance of major adverse cardiac event, overall high risk for surgery, but not from a specific cardiac etiology (abdominal surgery, ESRD on hemodialysis, history of stroke, on insulin).  She states that her most recent hemoglobin A1c was 6.6%.  She reports compliance with hemodialysis and tolerating the sessions well.  No further cardiac testing indicated at this time.  I discussed the situation with her today, she plans to pursue surgery.  She would need to hold Ozempic the week prior to anesthesia."  VS: BP 138/77   Pulse 82   Temp 36.8 C (Oral)    Resp 16   Ht 5\' 4"  (1.626 m)   LMP  (LMP Unknown)   SpO2 100%   BMI 38.35 kg/m   PROVIDERS: Estevan Oaks, NP is PCP   Nona Dell, MD is Cardiologist  LABS: Labs reviewed: Acceptable for surgery. (all labs ordered are listed, but only abnormal results are displayed)  Labs Reviewed  BASIC METABOLIC PANEL - Abnormal; Notable for the following components:      Result Value   Chloride 96 (*)    Glucose, Bld 100 (*)    BUN 36 (*)    Creatinine, Ser 5.82 (*)    GFR, Estimated 8 (*)    All other components within normal limits  HEMOGLOBIN A1C - Abnormal; Notable for the following components:   Hgb A1c MFr Bld 6.8 (*)    All other components within normal limits  GLUCOSE, CAPILLARY - Abnormal; Notable for the following components:   Glucose-Capillary 147 (*)    All other components within normal limits  CBC  TYPE AND SCREEN     IMAGES:   EKG:   CV: Echo 01/20/2019  1. The left ventricle has normal systolic function with an ejection  fraction of 60-65%. The cavity size was normal. There is moderately  increased left ventricular wall thickness. Left ventricular diastolic  Doppler parameters are indeterminate.   2. The right ventricle has normal systolic function. The cavity was  normal. There is no increase in right ventricular wall thickness.   3. Left atrial size was severely dilated.   4. Right atrial size was mildly dilated.   5. No evidence of mitral valve  stenosis.   6. The aortic valve is tricuspid. No stenosis of the aortic valve.   7. The aorta is normal unless otherwise noted.   8. There is dilatation of the aortic root.   9. Pulmonary hypertension is indeterminate, inadequate TR jet.  10. The inferior vena cava was dilated in size with >50% respiratory  variability.  Past Medical History:  Diagnosis Date   Anemia    Anxiety    Asystole (HCC)    a. 05/2015: pt developed bradycardia->asystole during Lexiscan nuclear stress test, s/p brief code.  Cath with only 30% prox LCx. Her asystole during Lexiscan infusion was felt to represent an excessive pharmacologic response to the agent and likely superimposed vasovagal response.   Bipolar disorder (HCC)    CHF (congestive heart failure) (HCC)    CKD (chronic kidney disease) stage 4, GFR 15-29 ml/min (HCC)    M_W_F dialysis   COPD (chronic obstructive pulmonary disease) (HCC)    Depression    Essential hypertension    GERD (gastroesophageal reflux disease)    History of blood transfusion 2011   History of seizure 2017   only one seizure - none since   History of stroke 09/2014   Acute lacunar infarct of the left thalamus   Leg cramps    Mild CAD    a. LHC 05/2015: 30% prox Cx, otherwise widely patent.   Morbid obesity (HCC)    Neuromuscular disorder (HCC)    neuropathy in feet   Neuropathy    feet   PTSD (post-traumatic stress disorder)    Retinal detachment    legally blind in right eye   Stroke (HCC)    x 2, right side weakness   Type 2 diabetes mellitus (HCC)     Past Surgical History:  Procedure Laterality Date   AV FISTULA PLACEMENT Right 01/08/2017   Procedure: RIGHT ARM BRACHIOCEPHALIC ARTERIOVENOUS (AV) FISTULA CREATION;  Surgeon: Maeola Harman, MD;  Location: [facility] OR;  Service: Vascular;  Laterality: Right;   AV FISTULA PLACEMENT Left 11/20/2018   Procedure: Creation of LEFT ARM ARTERIOVENOUS (AV) FISTULA;  Surgeon: Maeola Harman, MD;  Location[facility] OR;  Service: Vascular;  Laterality: Left;   AV FISTULA PLACEMENT Right 02/24/2021   Procedure: RIGHT UPPER ARM BASILIC VEIN ARTERIOVENOUS (AV) FISTULA CREATION;  Surgeon: Leonie Douglas, MD;  Location[facility];  Service: Vascular;  Laterality: Right;   AV FISTULA PLACEMENT Right 05/05/2021   Procedure: INSERTION OF ARTERIOVENOUS (AV) GORE-TEX GRAFT ARM;  Surgeon: Leonie Douglas, MD;  Location: [facility] OR;  Service: Vascular;  Laterality: Right;   BASCILIC VEIN TRANSPOSITION Left 02/11/2019   Procedure:  BASILIC VEIN TRANSPOSITION SECOND STAGE;  Surgeon: Maeola Harman, MD;  Location: [facility] OR;  Service: Vascular;  Laterality: Left;   CARDIAC CATHETERIZATION N/A 06/01/2015   Procedure: Left Heart Cath and Coronary Angiography;  Surgeon: Lyn Records, MD; CFX 30%, no other dz, EF nl by echo   CESAREAN SECTION     x2   COLONOSCOPY WITH PROPOFOL N/A 07/27/2022   Procedure: COLONOSCOPY WITH PROPOFOL;  Surgeon: Lanelle Bal, DO;  [facility];  Service: Endoscopy;  Laterality: N/A;  930am, asa 3, dialysis pt   DILATION AND CURETTAGE OF UTERUS     EYE SURGERY     Right eye pars plano vitrectomy    HEMATOMA EVACUATION Left 03/03/2019   Procedure: EVACUATION HEMATOMA;  Surgeon: Sherren Kerns, MD;  Location: [facility] OR;  Service: Vascular;  Laterality: Left;  INSERTION OF DIALYSIS CATHETER Right 01/04/2021   Procedure: INSERTION OF RIGHT INTERNAL JUGULAR TUNNELED DIALYSIS CATHETER;  Surgeon: Sherren Kerns, MD;  Location: [facility] OR;  Service: Vascular;  Laterality: Right;   LASIK     LIGATION OF ARTERIOVENOUS  FISTULA Left 01/04/2021   Procedure: LIGATION OF LEFT ARM ARTERIOVENOUS  FISTULA;  Surgeon: Sherren Kerns, MD;  Location: [facility] OR;  Service: Vascular;  Laterality: Left;   PARS PLANA VITRECTOMY  04/20/2011   Procedure: PARS PLANA VITRECTOMY WITH 25 GAUGE;  Surgeon: Sherrie George, MD;  Location: [facility] OR;  Service: Ophthalmology;  Laterality: Left;  membrane peel, gas fluid exchange, endolaser, repair of complex retinal detachment   PATCH ANGIOPLASTY Left 10/05/2020   Procedure: PATCH ANGIOPLASTY USING Livia Snellen BIOLOGIC PATCH;  Surgeon: Sherren Kerns, MD;  Location: [facility] OR;  Service: Vascular;  Laterality: Left;   PERIPHERAL VASCULAR BALLOON ANGIOPLASTY Left 09/02/2020   Procedure: PERIPHERAL VASCULAR BALLOON ANGIOPLASTY;  Surgeon: Cephus Shelling, MD;  [facility];  Service: Cardiovascular;  Laterality: Left;  arm fistula   REMOVAL OF A DIALYSIS CATHETER  N/A 06/21/2021   Procedure: MINOR REMOVAL OF TUNNELED DIALYSIS CATHETER;  Surgeon: Larina Earthly, MD;  Location[facility];  Service: Vascular;  Laterality: N/A;   REVISON OF ARTERIOVENOUS FISTULA Left 10/05/2020   Procedure: REVISION OF LEFT ARM ARTERIOVENOUS FISTULA;  Surgeon: Sherren Kerns, MD;  Location[facility];  Service: Vascular;  Laterality: Left;   TUBAL LIGATION      MEDICATIONS:  amLODipine (NORVASC) 10 MG tablet   atorvastatin (LIPITOR) 80 MG tablet   cloNIDine (CATAPRES) 0.2 MG tablet   Continuous Blood Gluc Receiver (DEXCOM G6 RECEIVER) DEVI   diclofenac Sodium (VOLTAREN) 1 % GEL   FLUoxetine (PROZAC) 40 MG capsule   furosemide (LASIX) 80 MG tablet   gabapentin (NEURONTIN) 300 MG capsule   hydrALAZINE (APRESOLINE) 50 MG tablet   hydrOXYzine (ATARAX) 10 MG tablet   insulin degludec (TRESIBA FLEXTOUCH) 100 UNIT/ML FlexTouch Pen   insulin lispro (HUMALOG KWIKPEN) 100 UNIT/ML KwikPen   lactulose (CHRONULAC) 10 GM/15ML solution   Lancets (ONETOUCH DELICA PLUS LANCET33G) MISC   lidocaine-prilocaine (EMLA) cream   linaclotide (LINZESS) 290 MCG CAPS capsule   omeprazole (PRILOSEC OTC) 20 MG tablet   ONETOUCH VERIO test strip   polyethylene glycol (MIRALAX) 17 g packet   Semaglutide, 2 MG/DOSE, (OZEMPIC, 2 MG/DOSE,) 8 MG/3ML SOPN   Tenapanor HCl (IBSRELA) 50 MG TABS   traZODone (DESYREL) 100 MG tablet   No current facility-administered medications for this encounter.     Jodell Cipro Ward, PA-C [facility] [phone]

## 2023-08-16 NOTE — Telephone Encounter (Signed)
 Dr. Diona Browner, he recently saw Ms. Umphlett in clinic on 07/31/2023.  She presented for preoperative cardiac evaluation.  She was compliant with hemodialysis and was stable from a cardiac standpoint.  We received a updated surgical request today stating the patient will need ventral hernia surgery as well.  May she proceed with surgery without further testing?  Thank you for your help.  Please direct your response to CVD IV preop pool.  Thomasene Ripple. Makayla Lanter NP-C     08/16/2023, 4:02 PM [facility] [address] Office [phone] Fax [phone]

## 2023-08-16 NOTE — Telephone Encounter (Signed)
 New Fax from [facility] stating the pt will be having a combined surgery now on 08/23/2023 and clearance is needed for Ventral Hernia Surgery, general anethesia.  Please FAX a note of Cardiac CLEARANCE to [phone], Santiago Glad, CMA.  Please call to advise if the patient will require an office visit or further medical work-up before clearance can be given.   Please cal [phone] (main number) and leave a message with the triage nurse.   Sent on behalf of Feliciana Rossetti, MD

## 2023-08-17 ENCOUNTER — Encounter: Payer: Self-pay | Admitting: *Deleted

## 2023-08-17 ENCOUNTER — Telehealth: Payer: Self-pay | Admitting: *Deleted

## 2023-08-17 NOTE — Telephone Encounter (Signed)
 Surgical clearance received from Nephrology. Form given to provider for review

## 2023-08-17 NOTE — Telephone Encounter (Signed)
     Primary Cardiologist: Nona Dell, MD  Chart reviewed as part of pre-operative protocol coverage. Given past medical history and time since last visit, based on ACC/AHA guidelines, Samantha Pierce would be at acceptable risk for the planned procedure without further cardiovascular testing.   Per Dr. Diona Browner   " RCRI perioperative cardiac risk index indicates 15% chance of major adverse cardiac event, overall high risk for surgery, but not from a specific cardiac etiology (abdominal surgery, ESRD on hemodialysis, history of stroke, on insulin).  She states that her most recent hemoglobin A1c was 6.6%.  She reports compliance with hemodialysis and tolerating the sessions well.  No further cardiac testing indicated at this time.  I discussed the situation with her today, she plans to pursue surgery.  She would need to hold Ozempic the week prior to anesthesia."  I will route this recommendation to the requesting party via Epic fax function and remove from pre-op pool.  Please call with questions.  Thomasene Ripple. Ashantia Amaral NP-C     08/17/2023, 7:00 AM [facility] [address] Office [phone] Fax [phone]

## 2023-08-17 NOTE — Telephone Encounter (Signed)
 Mailed letter

## 2023-08-17 NOTE — Telephone Encounter (Signed)
   Pre-operative Risk Assessment    Patient Name: Samantha Pierce  DOB: 07/13/1970 MRN: [id number]   Date of last office visit: 07/31/2023 Date of next office visit: 1 year    Request for Surgical Clearance    Procedure:   VENTRAL HERNIA SURGERY   Date of Surgery:  Clearance 08/23/23                                Surgeon:  Feliciana Rossetti, MD Surgeon's Group or Practice Name:  [facility]  Phone number:  [phone] Fax number:  [phone]   Type of Clearance Requested:   - Medical    Type of Anesthesia:  General    Additional requests/questions:   PATIENT DOES HAVE A SURGERY DATE SCHEDULED FOR A COMBINED CASE ON 08/23/2023 .  PLEASE FAX NOTE OF CARDIAC CLEARANCE TO [phone] - KELSEY PHILLIPS, CMA.  Elvin So   08/17/2023, 5:04 PM

## 2023-08-20 ENCOUNTER — Telehealth: Payer: Self-pay | Admitting: *Deleted

## 2023-08-20 NOTE — Telephone Encounter (Signed)
-----   Message from Doylene Bode sent at 08/20/2023  3:15 PM EDT ----- Please let the patient know we have been working on her surgery. Dr. Pricilla Holm is operating at [facility] and Dr. Sheliah Hatch is operating at [facility] on that day. Dr. Sheliah Hatch has been able to work out for his partner Dr. [name] to perform the hernia repair at [facility] so we will be able to keep things as is and on schedule. [name]. Making sure she is taken care of is our top concern and we have worked to keep things on schedule for her.   Samantha Pierce, please also reach out to the patient's dialysis center and let them know [name], [name]. They want to give her a later time in the day if possible so she can get discharged and get to the dialysis center. Adding you karen so you are in the loop as well.

## 2023-08-20 NOTE — Telephone Encounter (Signed)
 Spoke with Ms. Kelner and relayed message from Warner Mccreedy, NP that patient has to go to Dialysis either Friday or Saturday. Pt agreed to Dialysis on Saturday.   [facility] was called and they were already aware of patient needing dialysis, they stated that Ms. Heslin already called them. Pt has an appt. On Saturday, March 22 at 1000. Message relayed to providers.

## 2023-08-20 NOTE — Telephone Encounter (Signed)
 Spoke with Samantha Pierce and relayed below message from Warner Mccreedy, NP.  Pt states, "Oh I wish they were doing my surgery at [facility]."  Advised patient that Dr. Pricilla Holm is scheduled to perform her surgery at [facility] on 3/20. And Dr. Sheliah Hatch has his partner -Dr. Andrey Campanile be able to perform the hernia repair.   Also advised patient that she will stay overnight but will be discharged so that she can go to dialysis on Friday. Pt states, "No I am not going to go to dialysis until Monday" Pt was advised that she needs to go on Friday because of the medications that she will be receiving and dialysis should not wait until Monday. Pt was adamant and advised her message would be relayed to providers.

## 2023-08-20 NOTE — Telephone Encounter (Signed)
-----   Message from Samantha Pierce sent at 08/20/2023  3:15 PM EDT ----- Please let the patient know we have been working on her surgery. Samantha Pierce is operating at [facility] and Samantha Pierce is operating at [facility] on that day. Samantha Pierce has been able to work out for his partner Samantha Pierce to perform the hernia repair at [facility] so we will be able to keep things as is and on schedule. [name]. Making sure she is taken care of is our top concern and we have worked to keep things on schedule for her.   Samantha Pierce, please also reach out to the patient's dialysis center and let them know [name], [name]. They want to give her a later time in the day if possible so she can get discharged and get to the dialysis center. Adding you Samantha Pierce so you are in the loop as well.

## 2023-08-20 NOTE — Telephone Encounter (Signed)
   Patient Name: Samantha Pierce  DOB: May 08, 1971 MRN: [id number]  Primary Cardiologist: Nona Dell, MD  Chart reviewed as part of pre-operative protocol coverage. Given past medical history and time since last visit, based on ACC/AHA guidelines, Samantha Pierce is at acceptable risk for the planned procedure without further cardiovascular testing.   Per Dr. Diona Browner on 07/31/2023:   "RCRI perioperative cardiac risk index indicates 15% chance of major adverse cardiac event, overall high risk for surgery, but not from a specific cardiac etiology (abdominal surgery, ESRD on hemodialysis, history of stroke, on insulin).  She states that her most recent hemoglobin A1c was 6.6%.  She reports compliance with hemodialysis and tolerating the sessions well.  No further cardiac testing indicated at this time.  I discussed the situation with her today, she plans to pursue surgery.  She would need to hold Ozempic the week prior to anesthesia."  I will route this recommendation to the requesting party via Epic fax function and remove from pre-op pool.  Please call with questions.  Denyce Robert, NP 08/20/2023, 8:24 AM

## 2023-08-22 ENCOUNTER — Other Ambulatory Visit: Payer: Self-pay | Admitting: Gastroenterology

## 2023-08-22 ENCOUNTER — Telehealth: Payer: Self-pay | Admitting: *Deleted

## 2023-08-22 DIAGNOSIS — K581 Irritable bowel syndrome with constipation: Secondary | ICD-10-CM

## 2023-08-22 MED ORDER — IBSRELA 50 MG PO TABS: 50.0000 mg | ORAL_TABLET | Freq: Two times a day (BID) | ORAL | 5 refills | Status: DC

## 2023-08-22 NOTE — Telephone Encounter (Signed)
Attempted to reach patient for pre-op call, left voicemail requesting call back.

## 2023-08-22 NOTE — Telephone Encounter (Signed)
 Pt called and states that she finished the samples of Ibsrela and they are working well of her. She would like a prescription set to her pharmacy.

## 2023-08-22 NOTE — Telephone Encounter (Signed)
 I have sent a prescription to Samantha Pierce's specialty pharmacy, [facility] in [place], [place].  They should be reaching out to patient directly. She will need to be sure to answer the phone so they can get the medication delivered to her home.   If she doesn't hear from anyone today, she can call them at [phone], option 1.

## 2023-08-22 NOTE — Discharge Instructions (Addendum)
 AFTER SURGERY INSTRUCTIONS   Return to work: 4-6 weeks if applicable  You can resume all of your regular medications. You have been prescribed tramadol for pain as needed. Do not take this at the same time as your trazadone and only use the tramadol every 12 hours as needed for pain. You can use tylenol for mild pain. You can also use a heating pad and/or ice.  YOU ARE SET UP WITH YOUR DIALYSIS APPOINTMENT THIS AFTERNOON, MARCH 21. THEY WOULD LIKE FOR YOU TO ARRIVE BY NOON. IT IS IMPORTANT TO KEEP THIS APPOINTMENT SINCE YOU WILL BE GIVEN MULTIPLE MEDICATIONS AT THE TIME OF SURGERY.   Activity: 1. Be up and out of the bed during the day.  Take a nap if needed.  You may walk up steps but be careful and use the hand rail.  Stair climbing will tire you more than you think, you may need to stop part way and rest.    2. No lifting or straining for 6 weeks over 10 pounds. No pushing, pulling, straining for 6 weeks.   3. No driving for 1-19 days when the following criteria have been met: Do not drive if you are taking narcotic pain medicine and make sure that your reaction time has returned.    4. You can shower as soon as the next day after surgery. Shower daily.  Use your regular soap and water (not directly on the incision) and pat your incision(s) dry afterwards; don't rub.  No tub baths or submerging your body in water until cleared by your surgeon. If you have the soap that was given to you by pre-surgical testing that was used before surgery, you do not need to use it afterwards because this can irritate your incisions.    5. No sexual activity and nothing in the vagina for 10-12 weeks.   6. You may experience a small amount of clear drainage from your incisions, which is normal.  If the drainage persists, increases, or changes color please call the office.   7. Do not use creams, lotions, or ointments such as neosporin on your incisions after surgery until advised by your surgeon because they  can cause removal of the dermabond glue on your incisions.     8. You may experience vaginal spotting after surgery or when the stitches at the top of the vagina begin to dissolve.  The spotting is normal but if you experience heavy bleeding, call our office.   9. Take Tylenol first for pain if you are able to take these medication and only use narcotic pain medication for severe pain not relieved by the Tylenol.  Monitor your Tylenol intake to a max of 4,000 mg in a 24 hour period.    Diet: 1. Low sodium Heart Healthy Diet is recommended but you are cleared to resume your normal (before surgery) diet after your procedure.   2. It is safe to use a laxative, such as Miralax or Colace, if you have difficulty moving your bowels before surgery. You have been prescribed Sennakot-S to take at bedtime every evening after surgery to keep bowel movements regular and to prevent constipation.     Wound Care: 1. Keep clean and dry.  Shower daily.   Reasons to call the Doctor: Fever - Oral temperature greater than 100.4 degrees Fahrenheit Foul-smelling vaginal discharge Difficulty urinating Nausea and vomiting Increased pain at the site of the incision that is unrelieved with pain medicine. Difficulty breathing with or without chest pain  New calf pain especially if only on one side Sudden, continuing increased vaginal bleeding with or without clots.   Contacts: For questions or concerns you should contact:   Dr. Eugene Garnet at [phone]   Warner Mccreedy, NP at [phone]   After Hours: call [phone] and have the GYN Oncologist paged/contacted (after 5 pm or on the weekends). You will speak with an after hours RN and let he or she know you have had surgery.   Messages sent via mychart are for non-urgent matters and are not responded to after hours so for urgent needs, please call the after hours number.

## 2023-08-22 NOTE — Telephone Encounter (Signed)
Telephone call to check on pre-operative status.  Patient compliant with pre-operative instructions.  Reinforced nothing to eat after midnight. Clear liquids until 0645. Patient to arrive at 0745.  No questions or concerns voiced.  Instructed to call for any needs.

## 2023-08-22 NOTE — Telephone Encounter (Signed)
 LMOM for pt to call office

## 2023-08-23 ENCOUNTER — Ambulatory Visit (HOSPITAL_BASED_OUTPATIENT_CLINIC_OR_DEPARTMENT_OTHER)

## 2023-08-23 ENCOUNTER — Encounter (HOSPITAL_COMMUNITY)
Admission: RE | Disposition: A | Discharge status: Home / Self Care | Payer: Self-pay | Source: Ambulatory Visit | Attending: Gynecologic Oncology

## 2023-08-23 ENCOUNTER — Other Ambulatory Visit: Payer: Self-pay

## 2023-08-23 ENCOUNTER — Ambulatory Visit (HOSPITAL_COMMUNITY)
Admission: RE | Admit: 2023-08-23 | Discharge: 2023-08-24 | Disposition: A | Discharge status: Home / Self Care | Payer: 59 | Source: Ambulatory Visit | Attending: Gynecologic Oncology | Admitting: Gynecologic Oncology

## 2023-08-23 ENCOUNTER — Encounter (HOSPITAL_COMMUNITY): Payer: Self-pay | Admitting: Gynecologic Oncology

## 2023-08-23 ENCOUNTER — Ambulatory Visit (HOSPITAL_COMMUNITY): Payer: Self-pay | Admitting: Physician Assistant

## 2023-08-23 DIAGNOSIS — I2510 Atherosclerotic heart disease of native coronary artery without angina pectoris: Secondary | ICD-10-CM | POA: Diagnosis not present

## 2023-08-23 DIAGNOSIS — N838 Other noninflammatory disorders of ovary, fallopian tube and broad ligament: Secondary | ICD-10-CM | POA: Insufficient documentation

## 2023-08-23 DIAGNOSIS — D251 Intramural leiomyoma of uterus: Secondary | ICD-10-CM | POA: Diagnosis not present

## 2023-08-23 DIAGNOSIS — E66813 Obesity, class 3: Secondary | ICD-10-CM | POA: Insufficient documentation

## 2023-08-23 DIAGNOSIS — Z8673 Personal history of transient ischemic attack (TIA), and cerebral infarction without residual deficits: Secondary | ICD-10-CM | POA: Insufficient documentation

## 2023-08-23 DIAGNOSIS — Z7985 Long-term (current) use of injectable non-insulin antidiabetic drugs: Secondary | ICD-10-CM | POA: Insufficient documentation

## 2023-08-23 DIAGNOSIS — N9489 Other specified conditions associated with female genital organs and menstrual cycle: Secondary | ICD-10-CM | POA: Diagnosis present

## 2023-08-23 DIAGNOSIS — I110 Hypertensive heart disease with heart failure: Secondary | ICD-10-CM | POA: Diagnosis not present

## 2023-08-23 DIAGNOSIS — K219 Gastro-esophageal reflux disease without esophagitis: Secondary | ICD-10-CM | POA: Diagnosis not present

## 2023-08-23 DIAGNOSIS — N888 Other specified noninflammatory disorders of cervix uteri: Secondary | ICD-10-CM | POA: Insufficient documentation

## 2023-08-23 DIAGNOSIS — F17210 Nicotine dependence, cigarettes, uncomplicated: Secondary | ICD-10-CM | POA: Insufficient documentation

## 2023-08-23 DIAGNOSIS — K430 Incisional hernia with obstruction, without gangrene: Secondary | ICD-10-CM | POA: Insufficient documentation

## 2023-08-23 DIAGNOSIS — D279 Benign neoplasm of unspecified ovary: Secondary | ICD-10-CM | POA: Diagnosis not present

## 2023-08-23 DIAGNOSIS — J449 Chronic obstructive pulmonary disease, unspecified: Secondary | ICD-10-CM | POA: Insufficient documentation

## 2023-08-23 DIAGNOSIS — K5900 Constipation, unspecified: Secondary | ICD-10-CM | POA: Insufficient documentation

## 2023-08-23 DIAGNOSIS — F319 Bipolar disorder, unspecified: Secondary | ICD-10-CM | POA: Insufficient documentation

## 2023-08-23 DIAGNOSIS — Z98891 History of uterine scar from previous surgery: Secondary | ICD-10-CM | POA: Diagnosis not present

## 2023-08-23 DIAGNOSIS — I132 Hypertensive heart and chronic kidney disease with heart failure and with stage 5 chronic kidney disease, or end stage renal disease: Secondary | ICD-10-CM | POA: Insufficient documentation

## 2023-08-23 DIAGNOSIS — N186 End stage renal disease: Secondary | ICD-10-CM | POA: Diagnosis not present

## 2023-08-23 DIAGNOSIS — F418 Other specified anxiety disorders: Secondary | ICD-10-CM | POA: Diagnosis not present

## 2023-08-23 DIAGNOSIS — Z794 Long term (current) use of insulin: Secondary | ICD-10-CM | POA: Diagnosis not present

## 2023-08-23 DIAGNOSIS — Z5941 Food insecurity: Secondary | ICD-10-CM | POA: Insufficient documentation

## 2023-08-23 DIAGNOSIS — E1122 Type 2 diabetes mellitus with diabetic chronic kidney disease: Secondary | ICD-10-CM | POA: Diagnosis not present

## 2023-08-23 DIAGNOSIS — Z992 Dependence on renal dialysis: Secondary | ICD-10-CM | POA: Insufficient documentation

## 2023-08-23 DIAGNOSIS — N8501 Benign endometrial hyperplasia: Secondary | ICD-10-CM

## 2023-08-23 DIAGNOSIS — I10 Essential (primary) hypertension: Secondary | ICD-10-CM

## 2023-08-23 DIAGNOSIS — I5032 Chronic diastolic (congestive) heart failure: Secondary | ICD-10-CM | POA: Insufficient documentation

## 2023-08-23 DIAGNOSIS — N939 Abnormal uterine and vaginal bleeding, unspecified: Secondary | ICD-10-CM | POA: Diagnosis not present

## 2023-08-23 DIAGNOSIS — N736 Female pelvic peritoneal adhesions (postinfective): Secondary | ICD-10-CM | POA: Diagnosis not present

## 2023-08-23 DIAGNOSIS — Z6838 Body mass index (BMI) 38.0-38.9, adult: Secondary | ICD-10-CM | POA: Insufficient documentation

## 2023-08-23 DIAGNOSIS — Z79899 Other long term (current) drug therapy: Secondary | ICD-10-CM | POA: Diagnosis not present

## 2023-08-23 DIAGNOSIS — I509 Heart failure, unspecified: Secondary | ICD-10-CM | POA: Diagnosis not present

## 2023-08-23 DIAGNOSIS — Z7682 Awaiting organ transplant status: Secondary | ICD-10-CM | POA: Insufficient documentation

## 2023-08-23 DIAGNOSIS — E1165 Type 2 diabetes mellitus with hyperglycemia: Secondary | ICD-10-CM

## 2023-08-23 DIAGNOSIS — N83209 Unspecified ovarian cyst, unspecified side: Secondary | ICD-10-CM

## 2023-08-23 DIAGNOSIS — D271 Benign neoplasm of left ovary: Secondary | ICD-10-CM

## 2023-08-23 DIAGNOSIS — Z833 Family history of diabetes mellitus: Secondary | ICD-10-CM | POA: Insufficient documentation

## 2023-08-23 LAB — POCT I-STAT, CHEM 8
BUN: 32 mg/dL — ABNORMAL HIGH (ref 6–20)
Calcium, Ion: 1.12 mmol/L — ABNORMAL LOW (ref 1.15–1.40)
Chloride: 92 mmol/L — ABNORMAL LOW (ref 98–111)
Creatinine, Ser: 6.2 mg/dL — ABNORMAL HIGH (ref 0.44–1.00)
Glucose, Bld: 124 mg/dL — ABNORMAL HIGH (ref 70–99)
HCT: 37 % (ref 36.0–46.0)
Hemoglobin: 12.6 g/dL (ref 12.0–15.0)
Potassium: 3.7 mmol/L (ref 3.5–5.1)
Sodium: 136 mmol/L (ref 135–145)
TCO2: 30 mmol/L (ref 22–32)

## 2023-08-23 LAB — TYPE AND SCREEN
ABO/RH(D): A POS
Antibody Screen: NEGATIVE

## 2023-08-23 LAB — GLUCOSE, CAPILLARY
Glucose-Capillary: 147 mg/dL — ABNORMAL HIGH (ref 70–99)
Glucose-Capillary: 133 mg/dL — ABNORMAL HIGH (ref 70–99)
Glucose-Capillary: 222 mg/dL — ABNORMAL HIGH (ref 70–99)

## 2023-08-23 LAB — ABO/RH: ABO/RH(D): A POS

## 2023-08-23 SURGERY — HYSTERECTOMY, TOTAL, ROBOT-ASSISTED, LAPAROSCOPIC, WITH BILATERAL SALPINGO-OOPHORECTOMY
Anesthesia: General

## 2023-08-23 MED ORDER — TRAZODONE HCL 100 MG PO TABS
100.0000 mg | ORAL_TABLET | Freq: Every day | ORAL | Status: DC
  Administered 2023-08-23: 100 mg via ORAL
  Filled 2023-08-23: qty 1

## 2023-08-23 MED ORDER — CHLORHEXIDINE GLUCONATE 0.12 % MT SOLN
15.0000 mL | Freq: Once | OROMUCOSAL | Status: AC
  Administered 2023-08-23: 15 mL via OROMUCOSAL

## 2023-08-23 MED ORDER — ACETAMINOPHEN 500 MG PO TABS
1000.0000 mg | ORAL_TABLET | ORAL | Status: AC
  Administered 2023-08-23: 1000 mg via ORAL
  Filled 2023-08-23: qty 2

## 2023-08-23 MED ORDER — INSULIN GLARGINE 100 UNIT/ML ~~LOC~~ SOLN
15.0000 [IU] | Freq: Every day | SUBCUTANEOUS | Status: DC
  Administered 2023-08-23: 15 [IU] via SUBCUTANEOUS
  Filled 2023-08-23 (×2): qty 0.15

## 2023-08-23 MED ORDER — SUGAMMADEX SODIUM 200 MG/2ML IV SOLN
INTRAVENOUS | Status: DC | PRN
  Administered 2023-08-23: 400 mg via INTRAVENOUS

## 2023-08-23 MED ORDER — ACETAMINOPHEN 10 MG/ML IV SOLN
INTRAVENOUS | Status: AC
  Filled 2023-08-23: qty 100

## 2023-08-23 MED ORDER — GLYCOPYRROLATE 0.2 MG/ML IJ SOLN
INTRAMUSCULAR | Status: DC | PRN
  Administered 2023-08-23: .2 mg via INTRAVENOUS

## 2023-08-23 MED ORDER — OXYCODONE HCL 5 MG PO TABS: 5.0000 mg | ORAL_TABLET | Freq: Once | ORAL | Status: DC | PRN

## 2023-08-23 MED ORDER — BUPIVACAINE LIPOSOME 1.3 % IJ SUSP
INTRAMUSCULAR | Status: DC | PRN
  Administered 2023-08-23: 20 mL

## 2023-08-23 MED ORDER — PROPOFOL 10 MG/ML IV BOLUS
INTRAVENOUS | Status: DC | PRN
  Administered 2023-08-23: 200 mg via INTRAVENOUS

## 2023-08-23 MED ORDER — CELECOXIB 200 MG PO CAPS
200.0000 mg | ORAL_CAPSULE | Freq: Once | ORAL | Status: AC
  Administered 2023-08-23: 200 mg via ORAL
  Filled 2023-08-23: qty 1

## 2023-08-23 MED ORDER — CEFAZOLIN SODIUM-DEXTROSE 2-4 GM/100ML-% IV SOLN
2.0000 g | INTRAVENOUS | Status: AC
  Administered 2023-08-23: 2 g via INTRAVENOUS
  Filled 2023-08-23: qty 100

## 2023-08-23 MED ORDER — PROPOFOL 10 MG/ML IV BOLUS
INTRAVENOUS | Status: AC
  Filled 2023-08-23: qty 20

## 2023-08-23 MED ORDER — ACETAMINOPHEN 10 MG/ML IV SOLN
1000.0000 mg | Freq: Once | INTRAVENOUS | Status: AC
  Administered 2023-08-23: 1000 mg via INTRAVENOUS

## 2023-08-23 MED ORDER — AMLODIPINE BESYLATE 10 MG PO TABS
10.0000 mg | ORAL_TABLET | Freq: Every day | ORAL | Status: DC
Start: 2023-08-23 — End: 2023-08-24
  Administered 2023-08-23 – 2023-08-24 (×2): 10 mg via ORAL
  Filled 2023-08-23 (×2): qty 1

## 2023-08-23 MED ORDER — CLONIDINE HCL 0.1 MG PO TABS
0.1000 mg | ORAL_TABLET | Freq: Two times a day (BID) | ORAL | Status: DC
  Administered 2023-08-23: 0.1 mg via ORAL
  Filled 2023-08-23: qty 1

## 2023-08-23 MED ORDER — PHENYLEPHRINE 80 MCG/ML (10ML) SYRINGE FOR IV PUSH (FOR BLOOD PRESSURE SUPPORT)
PREFILLED_SYRINGE | INTRAVENOUS | Status: AC
  Filled 2023-08-23: qty 10

## 2023-08-23 MED ORDER — EPHEDRINE SULFATE (PRESSORS) 50 MG/ML IJ SOLN
INTRAMUSCULAR | Status: DC | PRN
  Administered 2023-08-23: 5 mg via INTRAVENOUS

## 2023-08-23 MED ORDER — HYDROMORPHONE HCL 1 MG/ML IJ SOLN
INTRAMUSCULAR | Status: AC
  Administered 2023-08-23: 0.25 mg via INTRAVENOUS
  Filled 2023-08-23: qty 1

## 2023-08-23 MED ORDER — LACTATED RINGERS IV SOLN: INTRAVENOUS | Status: DC

## 2023-08-23 MED ORDER — MEPERIDINE HCL 50 MG/ML IJ SOLN
INTRAMUSCULAR | Status: AC
  Filled 2023-08-23: qty 1

## 2023-08-23 MED ORDER — TENAPANOR HCL 50 MG PO TABS: 50.0000 mg | ORAL_TABLET | Freq: Two times a day (BID) | ORAL | Status: DC

## 2023-08-23 MED ORDER — FUROSEMIDE 20 MG PO TABS: 20.0000 mg | ORAL_TABLET | Freq: Two times a day (BID) | ORAL | Status: DC

## 2023-08-23 MED ORDER — ORAL CARE MOUTH RINSE: 15.0000 mL | Freq: Once | OROMUCOSAL | Status: AC

## 2023-08-23 MED ORDER — FLUOXETINE HCL 40 MG PO CAPS: 40.0000 mg | ORAL_CAPSULE | Freq: Every day | ORAL | Status: DC

## 2023-08-23 MED ORDER — HYDROMORPHONE HCL 1 MG/ML IJ SOLN
0.2500 mg | INTRAMUSCULAR | Status: DC | PRN
  Administered 2023-08-23 (×5): 0.25 mg via INTRAVENOUS

## 2023-08-23 MED ORDER — FENTANYL CITRATE (PF) 100 MCG/2ML IJ SOLN
INTRAMUSCULAR | Status: DC | PRN
  Administered 2023-08-23 (×2): 50 ug via INTRAVENOUS

## 2023-08-23 MED ORDER — SUCCINYLCHOLINE CHLORIDE 200 MG/10ML IV SOSY
PREFILLED_SYRINGE | INTRAVENOUS | Status: AC
  Filled 2023-08-23: qty 10

## 2023-08-23 MED ORDER — STERILE WATER FOR IRRIGATION IR SOLN
Status: DC | PRN
  Administered 2023-08-23: 1000 mL

## 2023-08-23 MED ORDER — LABETALOL HCL 5 MG/ML IV SOLN
10.0000 mg | Freq: Once | INTRAVENOUS | Status: AC
  Administered 2023-08-23: 10 mg via INTRAVENOUS

## 2023-08-23 MED ORDER — BUPIVACAINE HCL (PF) 0.25 % IJ SOLN
INTRAMUSCULAR | Status: AC
Start: 2023-08-23 — End: ?
  Filled 2023-08-23: qty 30

## 2023-08-23 MED ORDER — TRAMADOL HCL 50 MG PO TABS
50.0000 mg | ORAL_TABLET | Freq: Two times a day (BID) | ORAL | Status: DC | PRN
  Administered 2023-08-23 – 2023-08-24 (×2): 50 mg via ORAL
  Filled 2023-08-23 (×2): qty 1

## 2023-08-23 MED ORDER — FLUOXETINE HCL 20 MG PO CAPS
40.0000 mg | ORAL_CAPSULE | Freq: Every day | ORAL | Status: DC
  Administered 2023-08-23 – 2023-08-24 (×2): 40 mg via ORAL
  Filled 2023-08-23 (×2): qty 2

## 2023-08-23 MED ORDER — HEPARIN SODIUM (PORCINE) 5000 UNIT/ML IJ SOLN
5000.0000 [IU] | Freq: Three times a day (TID) | INTRAMUSCULAR | Status: DC
  Filled 2023-08-23: qty 1

## 2023-08-23 MED ORDER — ROCURONIUM BROMIDE 10 MG/ML (PF) SYRINGE
PREFILLED_SYRINGE | INTRAVENOUS | Status: AC
  Filled 2023-08-23: qty 10

## 2023-08-23 MED ORDER — GLYCOPYRROLATE 0.2 MG/ML IJ SOLN
INTRAMUSCULAR | Status: AC
  Filled 2023-08-23: qty 1

## 2023-08-23 MED ORDER — HYDROMORPHONE HCL 1 MG/ML IJ SOLN: 0.2500 mg | Freq: Four times a day (QID) | INTRAMUSCULAR | Status: DC | PRN

## 2023-08-23 MED ORDER — BUPIVACAINE HCL 0.25 % IJ SOLN
INTRAMUSCULAR | Status: DC | PRN
  Administered 2023-08-23: 20 mL

## 2023-08-23 MED ORDER — LACTATED RINGERS IR SOLN
Status: DC | PRN
  Administered 2023-08-23: 1000 mL

## 2023-08-23 MED ORDER — LIDOCAINE HCL (PF) 2 % IJ SOLN
INTRAMUSCULAR | Status: AC
  Filled 2023-08-23: qty 5

## 2023-08-23 MED ORDER — INSULIN ASPART 100 UNIT/ML IJ SOLN
0.0000 [IU] | Freq: Three times a day (TID) | INTRAMUSCULAR | Status: DC
  Administered 2023-08-24: 2 [IU] via SUBCUTANEOUS

## 2023-08-23 MED ORDER — SUCCINYLCHOLINE CHLORIDE 200 MG/10ML IV SOSY
PREFILLED_SYRINGE | INTRAVENOUS | Status: DC | PRN
Start: 2023-08-23 — End: 2023-08-23
  Administered 2023-08-23: 140 mg via INTRAVENOUS

## 2023-08-23 MED ORDER — ONDANSETRON HCL 4 MG/2ML IJ SOLN
INTRAMUSCULAR | Status: DC | PRN
  Administered 2023-08-23: 4 mg via INTRAVENOUS

## 2023-08-23 MED ORDER — DEXAMETHASONE SODIUM PHOSPHATE 10 MG/ML IJ SOLN
INTRAMUSCULAR | Status: AC
  Filled 2023-08-23: qty 1

## 2023-08-23 MED ORDER — ONDANSETRON HCL 4 MG PO TABS: 4.0000 mg | ORAL_TABLET | Freq: Three times a day (TID) | ORAL | Status: DC | PRN

## 2023-08-23 MED ORDER — ROCURONIUM BROMIDE 100 MG/10ML IV SOLN
INTRAVENOUS | Status: DC | PRN
Start: 2023-08-23 — End: 2023-08-23
  Administered 2023-08-23: 20 mg via INTRAVENOUS
  Administered 2023-08-23: 5 mg via INTRAVENOUS
  Administered 2023-08-23: 55 mg via INTRAVENOUS
  Administered 2023-08-23 (×3): 20 mg via INTRAVENOUS

## 2023-08-23 MED ORDER — AMISULPRIDE (ANTIEMETIC) 5 MG/2ML IV SOLN: 10.0000 mg | Freq: Once | INTRAVENOUS | Status: AC

## 2023-08-23 MED ORDER — DEXAMETHASONE SODIUM PHOSPHATE 4 MG/ML IJ SOLN
4.0000 mg | INTRAMUSCULAR | Status: AC
  Administered 2023-08-23: 10 mg via INTRAVENOUS

## 2023-08-23 MED ORDER — EPHEDRINE 5 MG/ML INJ
INTRAVENOUS | Status: AC
  Filled 2023-08-23: qty 5

## 2023-08-23 MED ORDER — PHENYLEPHRINE HCL (PRESSORS) 10 MG/ML IV SOLN
INTRAVENOUS | Status: DC | PRN
  Administered 2023-08-23: 80 ug via INTRAVENOUS

## 2023-08-23 MED ORDER — PHENYLEPHRINE HCL-NACL 20-0.9 MG/250ML-% IV SOLN
INTRAVENOUS | Status: DC | PRN
  Administered 2023-08-23: 50 ug/min via INTRAVENOUS

## 2023-08-23 MED ORDER — FENTANYL CITRATE (PF) 100 MCG/2ML IJ SOLN
INTRAMUSCULAR | Status: AC
  Filled 2023-08-23: qty 2

## 2023-08-23 MED ORDER — ACETAMINOPHEN 500 MG PO TABS: 1000.0000 mg | ORAL_TABLET | Freq: Once | ORAL | Status: DC

## 2023-08-23 MED ORDER — SODIUM CHLORIDE 0.9 % IV SOLN: INTRAVENOUS | Status: DC | PRN

## 2023-08-23 MED ORDER — HYDROCODONE-ACETAMINOPHEN 5-325 MG PO TABS
1.0000 | ORAL_TABLET | Freq: Three times a day (TID) | ORAL | Status: DC | PRN
Start: 2023-08-23 — End: 2023-08-24
  Filled 2023-08-23: qty 1

## 2023-08-23 MED ORDER — DEXMEDETOMIDINE HCL IN NACL 80 MCG/20ML IV SOLN
INTRAVENOUS | Status: AC
  Filled 2023-08-23: qty 20

## 2023-08-23 MED ORDER — HYDROMORPHONE HCL 1 MG/ML IJ SOLN
INTRAMUSCULAR | Status: AC
  Filled 2023-08-23: qty 1

## 2023-08-23 MED ORDER — GABAPENTIN 100 MG PO CAPS
300.0000 mg | ORAL_CAPSULE | Freq: Every day | ORAL | Status: DC
  Administered 2023-08-23 – 2023-08-24 (×2): 300 mg via ORAL
  Filled 2023-08-23 (×2): qty 3

## 2023-08-23 MED ORDER — ONDANSETRON HCL 4 MG/2ML IJ SOLN: 4.0000 mg | Freq: Three times a day (TID) | INTRAMUSCULAR | Status: DC | PRN

## 2023-08-23 MED ORDER — METRONIDAZOLE 500 MG/100ML IV SOLN
500.0000 mg | INTRAVENOUS | Status: AC
  Administered 2023-08-23: 500 mg via INTRAVENOUS
  Filled 2023-08-23: qty 100

## 2023-08-23 MED ORDER — LABETALOL HCL 5 MG/ML IV SOLN
INTRAVENOUS | Status: AC
  Filled 2023-08-23: qty 4

## 2023-08-23 MED ORDER — BUPIVACAINE LIPOSOME 1.3 % IJ SUSP
INTRAMUSCULAR | Status: AC
  Filled 2023-08-23: qty 20

## 2023-08-23 MED ORDER — GABAPENTIN 400 MG PO CAPS: 900.0000 mg | ORAL_CAPSULE | Freq: Two times a day (BID) | ORAL | Status: DC

## 2023-08-23 MED ORDER — AMISULPRIDE (ANTIEMETIC) 5 MG/2ML IV SOLN
INTRAVENOUS | Status: AC
  Administered 2023-08-23: 10 mg via INTRAVENOUS
  Filled 2023-08-23: qty 4

## 2023-08-23 MED ORDER — INSULIN ASPART 100 UNIT/ML IJ SOLN: 0.0000 [IU] | INTRAMUSCULAR | Status: DC | PRN

## 2023-08-23 MED ORDER — MEPERIDINE HCL 50 MG/ML IJ SOLN
25.0000 mg | Freq: Once | INTRAMUSCULAR | Status: DC
Start: 2023-08-23 — End: 2023-08-23

## 2023-08-23 MED ORDER — PANTOPRAZOLE SODIUM 40 MG PO TBEC
40.0000 mg | DELAYED_RELEASE_TABLET | Freq: Every day | ORAL | Status: DC
  Administered 2023-08-24: 40 mg via ORAL
  Filled 2023-08-23: qty 1

## 2023-08-23 MED ORDER — OMEPRAZOLE MAGNESIUM 20 MG PO TBEC
20.0000 mg | DELAYED_RELEASE_TABLET | Freq: Every day | ORAL | Status: DC
Start: 2023-08-24 — End: 2023-08-23

## 2023-08-23 MED ORDER — KETAMINE HCL 50 MG/5ML IJ SOSY
PREFILLED_SYRINGE | INTRAMUSCULAR | Status: AC
  Filled 2023-08-23: qty 5

## 2023-08-23 MED ORDER — LIDOCAINE 2% (20 MG/ML) 5 ML SYRINGE
INTRAMUSCULAR | Status: DC | PRN
  Administered 2023-08-23: 100 mg via INTRAVENOUS

## 2023-08-23 MED ORDER — HEPARIN SODIUM (PORCINE) 5000 UNIT/ML IJ SOLN
5000.0000 [IU] | INTRAMUSCULAR | Status: AC
  Administered 2023-08-23: 5000 [IU] via SUBCUTANEOUS
  Filled 2023-08-23: qty 1

## 2023-08-23 MED ORDER — OXYCODONE HCL 5 MG/5ML PO SOLN: 5.0000 mg | Freq: Once | ORAL | Status: DC | PRN

## 2023-08-23 MED ORDER — ATORVASTATIN CALCIUM 20 MG PO TABS
80.0000 mg | ORAL_TABLET | Freq: Every day | ORAL | Status: DC
  Administered 2023-08-23: 80 mg via ORAL
  Filled 2023-08-23: qty 8

## 2023-08-23 MED ORDER — ONDANSETRON HCL 4 MG/2ML IJ SOLN
INTRAMUSCULAR | Status: AC
Start: 2023-08-23 — End: ?
  Filled 2023-08-23: qty 2

## 2023-08-23 MED ORDER — KETAMINE HCL 10 MG/ML IJ SOLN
INTRAMUSCULAR | Status: DC | PRN
  Administered 2023-08-23 (×2): 10 mg via INTRAVENOUS
  Administered 2023-08-23: 30 mg via INTRAVENOUS

## 2023-08-23 SURGICAL SUPPLY — 73 items
APPLICATOR SURGIFLO ENDO (HEMOSTASIS) IMPLANT
BAG COUNTER SPONGE SURGICOUNT (BAG) IMPLANT
BAG LAPAROSCOPIC 12 15 PORT 16 (BASKET) IMPLANT
BAG RETRIEVAL 12/15 (BASKET) ×2 IMPLANT
BLADE SURG SZ10 CARB STEEL (BLADE) IMPLANT
COVER BACK TABLE 60X90IN (DRAPES) ×2 IMPLANT
COVER TIP SHEARS 8 DVNC (MISCELLANEOUS) ×2 IMPLANT
DERMABOND ADVANCED .7 DNX12 (GAUZE/BANDAGES/DRESSINGS) ×2 IMPLANT
DRAPE ARM DVNC X/XI (DISPOSABLE) ×8 IMPLANT
DRAPE COLUMN DVNC XI (DISPOSABLE) ×2 IMPLANT
DRAPE SHEET LG 3/4 BI-LAMINATE (DRAPES) ×2 IMPLANT
DRAPE SURG IRRIG POUCH 19X23 (DRAPES) ×2 IMPLANT
DRIVER NDL MEGA SUTCUT DVNCXI (INSTRUMENTS) ×2 IMPLANT
DRIVER NDLE MEGA SUTCUT DVNCXI (INSTRUMENTS) ×2 IMPLANT
DRSG OPSITE POSTOP 4X6 (GAUZE/BANDAGES/DRESSINGS) IMPLANT
DRSG OPSITE POSTOP 4X8 (GAUZE/BANDAGES/DRESSINGS) IMPLANT
ELECT PENCIL ROCKER SW 15FT (MISCELLANEOUS) IMPLANT
ELECT REM PT RETURN 15FT ADLT (MISCELLANEOUS) ×2 IMPLANT
FORCEPS BPLR FENES DVNC XI (FORCEP) ×2 IMPLANT
FORCEPS PROGRASP DVNC XI (FORCEP) ×2 IMPLANT
GAUZE 4X4 16PLY ~~LOC~~+RFID DBL (SPONGE) ×4 IMPLANT
GLOVE BIO SURGEON STRL SZ 6 (GLOVE) ×8 IMPLANT
GLOVE BIO SURGEON STRL SZ 6.5 (GLOVE) ×2 IMPLANT
GOWN STRL REUS W/ TWL LRG LVL3 (GOWN DISPOSABLE) ×8 IMPLANT
GRASPER SUT TROCAR 14GX15 (MISCELLANEOUS) IMPLANT
HOLDER FOLEY CATH W/STRAP (MISCELLANEOUS) IMPLANT
IRRIG SUCT STRYKERFLOW 2 WTIP (MISCELLANEOUS) ×2 IMPLANT
IRRIGATION SUCT STRKRFLW 2 WTP (MISCELLANEOUS) ×2 IMPLANT
KIT PROCEDURE DVNC SI (MISCELLANEOUS) IMPLANT
KIT TURNOVER KIT A (KITS) IMPLANT
LIGASURE IMPACT 36 18CM CVD LR (INSTRUMENTS) IMPLANT
MANIPULATOR ADVINCU DEL 3.0 PL (MISCELLANEOUS) IMPLANT
MANIPULATOR ADVINCU DEL 3.5 PL (MISCELLANEOUS) IMPLANT
MANIPULATOR UTERINE 4.5 ZUMI (MISCELLANEOUS) IMPLANT
NDL HYPO 21X1.5 SAFETY (NEEDLE) ×2 IMPLANT
NDL SPNL 18GX3.5 QUINCKE PK (NEEDLE) IMPLANT
NEEDLE HYPO 21X1.5 SAFETY (NEEDLE) ×2 IMPLANT
NEEDLE SPNL 18GX3.5 QUINCKE PK (NEEDLE) IMPLANT
OBTURATOR OPTICAL STND 8 DVNC (TROCAR) ×2 IMPLANT
OBTURATOR OPTICALSTD 8 DVNC (TROCAR) ×2 IMPLANT
PACK ROBOT GYN CUSTOM WL (TRAY / TRAY PROCEDURE) ×2 IMPLANT
PAD POSITIONING PINK XL (MISCELLANEOUS) ×2 IMPLANT
PORT ACCESS TROCAR AIRSEAL 12 (TROCAR) IMPLANT
SCISSORS MNPLR CVD DVNC XI (INSTRUMENTS) ×2 IMPLANT
SCRUB CHG 4% DYNA-HEX 4OZ (MISCELLANEOUS) IMPLANT
SEAL UNIV 5-12 XI (MISCELLANEOUS) ×8 IMPLANT
SET TRI-LUMEN FLTR TB AIRSEAL (TUBING) ×2 IMPLANT
SPIKE FLUID TRANSFER (MISCELLANEOUS) ×2 IMPLANT
SPONGE T-LAP 18X18 ~~LOC~~+RFID (SPONGE) IMPLANT
SURGIFLO W/THROMBIN 8M KIT (HEMOSTASIS) IMPLANT
SUT MNCRL AB 4-0 PS2 18 (SUTURE) IMPLANT
SUT NOVA NAB GS-21 0 18 T12 DT (SUTURE) IMPLANT
SUT NOVA NAB GS-21 1 T12 (SUTURE) IMPLANT
SUT PDS AB 1 TP1 96 (SUTURE) IMPLANT
SUT STRATA PDS 0 30 CT-2.5 (SUTURE) IMPLANT
SUT VIC AB 0 CT1 27XBRD ANTBC (SUTURE) IMPLANT
SUT VIC AB 2-0 CT1 TAPERPNT 27 (SUTURE) IMPLANT
SUT VIC AB 2-0 SH 27X BRD (SUTURE) IMPLANT
SUT VIC AB 3-0 SH 18 (SUTURE) IMPLANT
SUT VIC AB 4-0 PS2 18 (SUTURE) ×4 IMPLANT
SUT VICRYL 0 27 CT2 27 ABS (SUTURE) ×2 IMPLANT
SUT VLOC 180 0 9IN GS21 (SUTURE) IMPLANT
SYR 10ML LL (SYRINGE) IMPLANT
SYS BAG RETRIEVAL 10MM (BASKET) IMPLANT
SYS WOUND ALEXIS 18CM MED (MISCELLANEOUS) IMPLANT
SYSTEM BAG RETRIEVAL 10MM (BASKET) IMPLANT
SYSTEM WOUND ALEXIS 18CM MED (MISCELLANEOUS) IMPLANT
TRAP SPECIMEN MUCUS 40CC (MISCELLANEOUS) IMPLANT
TRAY FOLEY MTR SLVR 16FR STAT (SET/KITS/TRAYS/PACK) ×2 IMPLANT
TROCAR PORT AIRSEAL 5X120 (TROCAR) IMPLANT
UNDERPAD 30X36 HEAVY ABSORB (UNDERPADS AND DIAPERS) ×4 IMPLANT
WATER STERILE IRR 1000ML POUR (IV SOLUTION) ×2 IMPLANT
YANKAUER SUCT BULB TIP 10FT TU (MISCELLANEOUS) IMPLANT

## 2023-08-23 NOTE — Op Note (Signed)
 08/23/2023  2:00 PM  PATIENT:  Samantha Pierce  53 y.o. female  PRE-OPERATIVE DIAGNOSIS:  COMPLEX ADNEXAL MASS, INCISIONAL HERNIA 2.5 cm  POST-OPERATIVE DIAGNOSIS:  same  PROCEDURE:  Procedure(s): HYSTERECTOMY, TOTAL, ROBOT-ASSISTED, LAPAROSCOPIC, WITH BILATERAL SALPINGO-OOPHORECTOMY, LYSIS OF ADHESIONS - Dr Pricilla Holm OPEN PRIMARY REPAIR INCISIONAL HERNIA - 2.5 cm - Dr Andrey Campanile  SURGEON:  Surgeon(s): Pricilla Holm Carmelina Peal, MD [name], MD   ASSISTANTS: none for my portion   ANESTHESIA:   general  DRAINS: none   LOCAL MEDICATIONS USED:  OTHER exparel  SPECIMEN:  Source of Specimen:  hernia sac and omentum  DISPOSITION OF SPECIMEN:   discarded  COUNTS:  YES  INDICATION FOR PROCEDURE: The patient was undergoing a planned robotic assisted laparoscopic total hysterectomy with bilateral salpingo-oophorectomy by Dr. Pricilla Holm for complex adnexal mass.  On her preoperative imaging she was noted to have an incisional hernia and a prior C-section.  It was fat-containing.  She saw one of my partners in the office to discuss concomitant incisional hernia repair at the time of her robotic assisted hysterectomy.  It was felt that the hernia may have to be dealt with during that procedure in order to perform the hysterectomy.  She was seen by one of my partners in the office and hernia repair was discussed.  He was not available to perform her surgery this week so I was asked to perform the procedure since I was available.  I met the patient in the preoperative holding area and went over the plan.  We discussed that we may not have to repair the incisional hernia depending on how the adhesions were inside her abdomen and whether or not the incarcerated contents had to be reduced or not.  We discussed that if we did have to do a incisional hernia repair we would likely not use mesh since this was not a "clean "procedure.  We discussed the risk benefits which were separately documented  PROCEDURE: Please  see Dr. Winferd Humphrey operative note for discussion about her portion of the procedure.  When Dr. Pricilla Holm had extracted the specimen through the vagina inside the vaginal cuff I was called to the operating room.  Patient had robotic trocars in her upper abdomen the left, upper midline and right upper quadrant.  There was a 12 mm air seal trocar in the left upper quadrant.  There was no evidence of bleeding.  The vaginal cuff was intact.  There is no evidence of enterotomy.  I sat down at the surgeon console at the excised robot and inspected the fascial defect.  The patient had a preoperative incisional hernia on exam measuring about 2-1/2 cm.  Intra-abdominally, part of the omentum had been amputated in order to perform the hysterectomy.  There was still some omentum herniated into the fascial defect.  I reduced it robotically there was a fair amount of omentum that was incarcerated.  It was reduced back into the abdominal cavity with a combination of blunt mobilization along with robotic scissors.  Because this was not a clean procedure I felt we would just do a primary muscle repair without mesh.  The surgical robot was undocked.  I made a small transverse incision directly over the fascial defect in the lower abdomen slightly to the right of the midline for about 4 cm.  Subcutaneous tissue was divided with electrocautery.  The hernia defect was encountered.  I then closed the hernia defect transversely with 5 interrupted #1 Novafil sutures.  Should be noted that  her fascia was very tough and dense and fibrous.  We then reestablished pneumoperitoneum and visualized the closure.  There is no evidence of anything stuck to the closure.  It was well-closed.  There was no air leak.  I infiltrated 15 cc of Exparel in a regional fashion around the fascial closure.  We placed the omentum and a specimen retrieval bag and brought it out through the air seal trocar in the left upper quadrant.  I then closed the fascial defect  with a 0 Vicryl on a PMI suture passer.  It was airtight.  We infiltrated some local in that place.  At this time Dr. Pricilla Holm joined back in the operating room and she inspected the abdomen laparoscopically as well.  I showed her the fascial closure that I performed.  Pneumoperitoneum was released and the remaining trocars were removed.  I ensured hemostasis and my small open incision in the lower abdomen.  Deep dermis was closed with multiple interrupted 3-0 Vicryl sutures followed by skin closure with a running 4-0 Monocryl in a subcuticular fashion followed by the application of Dermabond.  All needle, instrument, and sponge counts were correct for my portion of the procedure.  Please see Dr. Winferd Humphrey operative note for additional details   Type of ventral wall repair:  Primary muscle repair; no mesh   PLAN OF CARE: Admit for overnight observation  PATIENT DISPOSITION:  PACU - hemodynamically stable.   Delay start of Pharmacological VTE agent (>24hrs) due to surgical blood loss or risk of bleeding:  per gyn-onc service  [name]. Andrey Campanile, MD, FACS General, Bariatric, & Minimally Invasive Surgery [facility], [place]

## 2023-08-23 NOTE — Anesthesia Procedure Notes (Signed)
 Procedure Name: Intubation Date/Time: 08/23/2023 10:37 AM  Performed by: Hulan Fess, CRNAPre-anesthesia Checklist: Patient identified, Emergency Drugs available, Suction available and Patient being monitored Patient Re-evaluated:Patient Re-evaluated prior to induction Oxygen Delivery Method: Circle System Utilized Preoxygenation: Pre-oxygenation with 100% oxygen Induction Type: IV induction Ventilation: Mask ventilation without difficulty Laryngoscope Size: Glidescope and 3 Grade View: Grade I Tube type: Oral Tube size: 7.0 mm Number of attempts: 1 Airway Equipment and Method: Stylet and Oral airway Placement Confirmation: ETT inserted through vocal cords under direct vision, positive ETCO2 and breath sounds checked- equal and bilateral Secured at: 22 cm Tube secured with: Tape Dental Injury: Teeth and Oropharynx as per pre-operative assessment

## 2023-08-23 NOTE — Anesthesia Postprocedure Evaluation (Signed)
 Anesthesia Post Note  Patient: Samantha Pierce  Procedure(s) Performed: HYSTERECTOMY, TOTAL, ROBOT-ASSISTED, LAPAROSCOPIC, WITH BILATERAL SALPINGO-OOPHORECTOMY, LYSIS OF ADHESIONS (Bilateral) OPEN PRIMARY REPAIR INCISIONAL HERNIA     Patient location during evaluation: PACU Anesthesia Type: General Level of consciousness: awake and alert Pain management: pain level controlled Vital Signs Assessment: post-procedure vital signs reviewed and stable Respiratory status: spontaneous breathing, nonlabored ventilation, respiratory function stable and patient connected to nasal cannula oxygen Cardiovascular status: blood pressure returned to baseline and stable Postop Assessment: no apparent nausea or vomiting Anesthetic complications: no   No notable events documented.  Last Vitals:  Vitals:   08/23/23 1730 08/23/23 1755  BP: (!) 155/82 (!) 151/81  Pulse: (!) 101 (!) 106  Resp: 19 15  Temp:  36.8 C  SpO2: 95% 93%    Last Pain:  Vitals:   08/23/23 1755  TempSrc: Oral  PainSc: Asleep                 Nelle Don Marysol Wellnitz

## 2023-08-23 NOTE — Interval H&P Note (Signed)
 History and Physical Interval Note:  08/23/2023 9:22 AM  Samantha Pierce  has presented today for surgery, with the diagnosis of COMPLEX ADNEXAL MASS.  The various methods of treatment have been discussed with the patient and family. After consideration of risks, benefits and other options for treatment, the patient has consented to  Procedure(s) with comments: HYSTERECTOMY, TOTAL, ROBOT-ASSISTED, LAPAROSCOPIC, WITH BILATERAL SALPINGO-OOPHORECTOMY (Bilateral) POSSIBLE LAPAROTOMY WITH POSSIBLE STAGING (N/A) ROBOTIC ASSISTED LAPAROSCOPIC VENTRAL/INCISIONAL HERNIA REPAIR (N/A) - with Mesh as a surgical intervention.  The patient's history has been reviewed, patient examined, no change in status, stable for surgery.  I have reviewed the patient's chart and labs.  Questions were answered to the patient's satisfaction.    Patient was seen and examined in the office last week by Dr. Alvan Dame.  The patient's procedure is being done at [facility] long hospital stay Dr. Alvan Dame is unavailable so I been asked to assist for potential repair of her incisional hernia if needed during her robotic hysterectomy.  I discussed the case with Dr. Pricilla Holm and reviewed the patient's imaging and Dr. Shella Maxim office note.  The plan would be to try to avoid the incisional hernia during her robotic hysterectomy.  If the defect has to be reduced then the plan would be to proceed with concurrent robotic repair of incisional hernia with possible mesh possible open.   We discussed the risk and benefits of surgery including but not limited to bleeding, infection, injury to surrounding structures, hernia recurrence, mesh complications, hematoma/seroma formation, need to convert to an open procedure, blood clot formation, urinary retention, post operative ileus, general anesthesia risk, long-term abdominal pain. We discussed that this procedure can be quite uncomfortable and difficult to recover from based on how the mesh is secured to  the abdominal wall. We discussed the importance of avoiding heavy lifting and straining for a period of 6 weeks.  We discussed that she may be at increased risk for postoperative bleeding or hematoma since she is on hemodialysis.  All of her questions were asked and answered  [name]

## 2023-08-23 NOTE — Progress Notes (Signed)
 Once RT arrived to PACU #3- bedside staff stated BiPAP not needed at this time- PT improving. PT was awake and did not appear to be in respiratory distress. Order has been changed to PRN. RT encouraged staff to call if any other needs occur.

## 2023-08-23 NOTE — Plan of Care (Signed)
  Problem: Education: Goal: Knowledge of General Education information will improve Description: Including pain rating scale, medication(s)/side effects and non-pharmacologic comfort measures Outcome: Progressing   Problem: Health Behavior/Discharge Planning: Goal: Ability to manage health-related needs will improve Outcome: Progressing   Problem: Clinical Measurements: Goal: Ability to maintain clinical measurements within normal limits will improve Outcome: Progressing Goal: Will remain free from infection Outcome: Progressing Goal: Diagnostic test results will improve Outcome: Progressing Goal: Respiratory complications will improve Outcome: Progressing Goal: Cardiovascular complication will be avoided Outcome: Progressing   Problem: Activity: Goal: Risk for activity intolerance will decrease Outcome: Progressing   Problem: Nutrition: Goal: Adequate nutrition will be maintained Outcome: Progressing   Problem: Coping: Goal: Level of anxiety will decrease Outcome: Progressing   Problem: Elimination: Goal: Will not experience complications related to bowel motility Outcome: Progressing Goal: Will not experience complications related to urinary retention Outcome: Progressing   Problem: Pain Managment: Goal: General experience of comfort will improve and/or be controlled Outcome: Progressing   Problem: Safety: Goal: Ability to remain free from injury will improve Outcome: Progressing   Problem: Skin Integrity: Goal: Risk for impaired skin integrity will decrease Outcome: Progressing   Problem: Education: Goal: Ability to describe self-care measures that may prevent or decrease complications (Diabetes Survival Skills Education) will improve Outcome: Progressing Goal: Individualized Educational Video(s) Outcome: Progressing   Problem: Coping: Goal: Ability to adjust to condition or change in health will improve Outcome: Progressing   Problem: Fluid  Volume: Goal: Ability to maintain a balanced intake and output will improve Outcome: Progressing   Problem: Health Behavior/Discharge Planning: Goal: Ability to identify and utilize available resources and services will improve Outcome: Progressing Goal: Ability to manage health-related needs will improve Outcome: Progressing   Problem: Metabolic: Goal: Ability to maintain appropriate glucose levels will improve Outcome: Progressing   Problem: Nutritional: Goal: Maintenance of adequate nutrition will improve Outcome: Progressing Goal: Progress toward achieving an optimal weight will improve Outcome: Progressing   Problem: Skin Integrity: Goal: Risk for impaired skin integrity will decrease Outcome: Progressing   Problem: Tissue Perfusion: Goal: Adequacy of tissue perfusion will improve Outcome: Progressing   Problem: Education: Goal: Knowledge of the prescribed therapeutic regimen will improve Outcome: Progressing Goal: Understanding of sexual limitations or changes related to disease process or condition will improve Outcome: Progressing Goal: Individualized Educational Video(s) Outcome: Progressing   Problem: Self-Concept: Goal: Communication of feelings regarding changes in body function or appearance will improve Outcome: Progressing   Problem: Skin Integrity: Goal: Demonstration of wound healing without infection will improve Outcome: Progressing

## 2023-08-23 NOTE — Transfer of Care (Signed)
 Immediate Anesthesia Transfer of Care Note  Patient: Samantha Pierce  Procedure(s) Performed: HYSTERECTOMY, TOTAL, ROBOT-ASSISTED, LAPAROSCOPIC, WITH BILATERAL SALPINGO-OOPHORECTOMY, LYSIS OF ADHESIONS (Bilateral) OPEN PRIMARY REPAIR INCISIONAL HERNIA  Patient Location: PACU  Anesthesia Type:General  Level of Consciousness: awake, alert , and oriented  Airway & Oxygen Therapy: Patient Spontanous Breathing and Patient connected to face mask oxygen  Post-op Assessment: Report given to RN and Post -op Vital signs reviewed and stable  Post vital signs: Reviewed and stable  Last Vitals:  Vitals Value Taken Time  BP    Temp    Pulse    Resp    SpO2      Last Pain:  Vitals:   08/23/23 0958  TempSrc:   PainSc: 0-No pain         Complications: No notable events documented.

## 2023-08-23 NOTE — Interval H&P Note (Signed)
 History and Physical Interval Note:  08/23/2023 9:05 AM  Samantha Pierce  has presented today for surgery, with the diagnosis of COMPLEX ADNEXAL MASS.  The various methods of treatment have been discussed with the patient and family. After consideration of risks, benefits and other options for treatment, the patient has consented to  Procedure(s) with comments: HYSTERECTOMY, TOTAL, ROBOT-ASSISTED, LAPAROSCOPIC, WITH BILATERAL SALPINGO-OOPHORECTOMY (Bilateral) POSSIBLE LAPAROTOMY WITH POSSIBLE STAGING (N/A) ROBOTIC ASSISTED LAPAROSCOPIC VENTRAL/INCISIONAL HERNIA REPAIR (N/A) - with Mesh as a surgical intervention.  The patient's history has been reviewed, patient examined, no change in status, stable for surgery.  I have reviewed the patient's chart and labs.  Questions were answered to the patient's satisfaction.     Carver Fila

## 2023-08-23 NOTE — Op Note (Signed)
 OPERATIVE NOTE  Pre-operative Diagnosis: Complex adnexal mass, AUB, hernia at the site of prior c-section incision  Post-operative Diagnosis: same, benign ovarian cyst, benign endometrium, adhesive disease  Operation: Extensive lysis of adhesions, robotic-assisted laparoscopic total hysterectomy with bilateral salpingo-oophorectomy Significant adhesive disease required additional surgical time and increased the complexity of the case. The complexity of adhesive disease increased the duration of the procedure by 45 minutes.   Incisional hernia repair (Dr. Andrey Campanile)  Surgeon: Eugene Garnet MD  Assistant Surgeon: Warner Mccreedy, NP (an NP assistant was necessary for tissue manipulation, management of robotic instrumentation, retraction and positioning due to the complexity of the case and hospital policies).   Anesthesia: GET  Urine Output: 100 cc  Operative Findings: On EUA, minimally mobile uterus tethered anteriorly. On intra-abdominal entry, normal upper abdominal survey. Omentum adherent to the anterior abdominal wall below the level of the umbilicus with some herniated through 3 cm infraumbilical hernia at the level of her prior c-section scar. Normal appearing small and large bowel. Left ovary replaced by smooth 6 cm cystic mass. Normal right ovary, bilateral tubes. Significant adhesions between the uterus and the anterior abdominal wall/bladder peritoneum. Some contraction of bilateral round ligaments due to adhesive disease. NO ascites.  Endometrium benign on frozen section. Left ovary with benign cyst.   Estimated Blood Loss:  (50 cc for my portion of the surgery) 75 cc total for the procedure      Total IV Fluids: see I&O flowsheet         Specimens: uterus, cervix, bilateral tubes and ovaries, pelvic washings         Complications:  None apparent; patient tolerated the procedure well.         Disposition: PACU - hemodynamically stable.  Procedure Details  The patient was  seen in the Holding Room. The risks, benefits, complications, treatment options, and expected outcomes were discussed with the patient.  The patient concurred with the proposed plan, giving informed consent.  The site of surgery properly noted/marked. The patient was identified as Samantha Pierce and the procedure verified as a Robotic-assisted hysterectomy with bilateral salpingo oophorectomy.   After induction of anesthesia, the patient was draped and prepped in the usual sterile manner. Patient was placed in supine position after anesthesia and draped and prepped in the usual sterile manner as follows: Her arms were tucked to her side with all appropriate precautions.  The patient was secured to the bed using padding and tape across her chest.  The patient was placed in the semi-lithotomy position in Pike stirrups.  The perineum and vagina were prepped with Betadine. The patient's abdomen was prepped with ChloraPrep and then she was draped after the prep had been allowed to dry for 3 minutes.  A Time Out was held and the above information confirmed.  The urethra was prepped with Betadine. Foley catheter was placed.  A sterile speculum was placed in the vagina.  The cervix was grasped with a single-tooth tenaculum. The cervix was dilated with Shawnie Pons dilators. An EMB was performed with an Endometrial Pipelle and sent for frozen section.  The ZUMI uterine manipulator with a medium colpotomizer ring was placed without difficulty.  A pneum occluder balloon was placed over the manipulator.  OG tube placement was confirmed and to suction.   Next, a 10 mm skin incision was made 1 cm below the subcostal margin in the midclavicular line.  The 5 mm Optiview port and scope was used for direct entry.  Opening pressure was under  10 mm CO2.  The abdomen was insufflated and the findings were noted as above.   At this point and all points during the procedure, the patient's intra-abdominal pressure did not exceed 15 mmHg.  Next, an 8 mm skin incision was made superior to the umbilicus and a right and left port were placed about 8 cm lateral to the robot port on the right and left side.  A fourth arm was placed on the right.  The 5 mm assist trocar was exchanged for a 12 mm airseal port. All ports were placed under direct visualization.  The patient was placed in steep Trendelenburg. The robot was docked in the normal manner.  Attention was first turned to the anterior abdominal wall. Omentum was adherent to the anterior abdominal wall and some was within the anterior abdominal wall hernia. Adhesions of the omentum to the anterior abdominal wall was lysed using monopolar and bipolar electrocautery. At the level of the hernia, the omentum was transected at the level of the abdominal wall (leaving some omentum within the hernia). Once the omentum had been freed, it was moved into the upper abdomen.   The left peritoneum was opened parallel to the IP ligament to open the retroperitoneal space. The round ligament was preserved initially given its retraction and distortion due to adhesions of the uterus anteriorly. The ureter was noted on the medial leaf of the broad ligament. The peritoneum above the ureter was incised and stretched and the IP ligament was skeletonized, cauterized and cut.  The utero-ovarian ligament was then similarly skeletonized, cauterized, and cut, freeing the left adnexa. This was placed in a 15 mm Endocatch bag inserted through the LUQ airseal trocar.   Attention was the turned to the right. The right peritoneum was opened parallel to the IP ligament to open the retroperitoneal space. The round ligament was transected. The ureter was noted to be on the medial leaf of the broad ligament.  The peritoneum above the ureter was incised and stretched and the infundibulopelvic ligament was skeletonized, cauterized and cut.    The posterior peritoneum was taken down to the level of the KOH ring.    At this point,  attention was turned anteriorly. The adhesions of the anterior uterine fundus to the abdominal wall were lysed using monopolar electrocautery and blunt dissection, taking care to not enter the bladder. Once the superior aspect of the adhesion had been free, the left round ligament was transected. It appeared that some of the left fallopian tube was likely adherent to the round ligament on this side. A combination of blunt dissection, sharp dissection, and short bursts of monopolar electrocautery were used to meticulously take down the anterior peritoneum.  The bladder flap was created to the level of the KOH ring.  The uterine artery on the right side was skeletonized, cauterized and cut in the normal manner.  A similar procedure was performed on the left.  The colpotomy was made and the uterus, cervix, bilateral ovaries and tubes were amputated, placed in a 15 mm Endocatch bag and delivered through the vagina.  Pedicles were inspected and excellent hemostasis was achieved.    The colpotomy at the vaginal cuff was closed with 0 Vicryl with a figure of eight at each apex and 0 V-Loc to close the midportion of the cuff in two layers in a running manner.  Irrigation was used and excellent hemostasis was achieved.  At this point in the procedure was turned over to Dr. Andrey Campanile.  Once he had completed the hernia repair and the 12 mm airseal port had been removed and the fascia closed with 0 Vicryl, I scrubbed back in to help close robotic incisions. Dr. Andrey Campanile close the low transverse abdominal mini-lap. The subcuticular tissue of the robotic trocar incisions and LLQ incision were closed with 4-0 Vicryl and the skin was closed with 4-0 Monocryl in a subcuticular manner.  Dermabond was applied.    The vagina was swabbed with minimal bleeding noted. Foley catheter was removed.  All sponge, lap and needle counts were correct x  3.   The patient was transferred to the recovery room in stable condition.  Eugene Garnet, MD

## 2023-08-23 NOTE — Progress Notes (Addendum)
 Pharmacy Consult Note regarding:  Chronic dialysis patient, surgery today, request per Dr Pricilla Holm for Pharmacy to review post-op medications for renal dosing, appropriateness with renal disease.   Assessment: Pharmacy consulted to renally dose adjust medications for this 53 yo female who had a total hysterectomy with bilateral salpingo-oophorectomy, lysis of adhesions and open primary repair incisional hernia today.  Ht: 64 inches Wt: 101.3 kg BSA: 38.35 kg/m2  Today's Serum creatinine 6.20 and estimated CrCl 12.1 ml/min  Active medications reviewed for renal dose adjustment. No adjustment required for current doses of amlodipine, atorvastatin, clonidine, fluoxetine, subcutaneous heparin, pantoprazole, tenapanor, hydromorphone, hydrocodone-acetaminophen, tramadol, ondansetron, and trazodone.  Plan: Reduce gabapentin to 300 mg po daily (and as outpatient or if transferred to another facility for dialysis, administer a supplemental post-hemodialysis gabapentin 350 mg dose after each four hour hemodialysis session)   Thank you for allowing pharmacy to be a part of this patient's care.  Selinda Eon, PharmD, BCPS Clinical Pharmacist [place] 08/23/2023 7:25 PM

## 2023-08-23 NOTE — H&P (Signed)
 Chief Complaint:  Chief Complaint Patient presents with Ventral Hernia  Subjective  Samantha Pierce is a 53 y.o. female new patient in today for: History of Present Illness The patient, with a history of two C-sections, diabetes, and dialysis, presents for a pre-operative consultation for a hernia and hysterectomy due to concern for cancer. The hernia is located in the middle of the abdomen, likely related to the patient's previous C-sections which resulted in hematomas and significant scar tissue. The patient also reports a history of severe constipation, which has recently improved with new medication. The patient has been a smoker for many years, but only smokes about three cigarettes a day. The patient has been on dialysis for an unspecified amount of time, receiving treatment on Mondays, Wednesdays, and Fridays. The patient's diabetes has been managed for 37 years, with a recent A1c of 6.7. The patient has lost about 50 pounds in order to be listed for a kidney transplant.  Social Drivers of Health with Concerns  Tobacco Use: High Risk (08/16/2023) Patient History Smoking Tobacco Use: Every Day Smokeless Tobacco Use: Never Food Insecurity: Food Insecurity Present (07/24/2023) Received from [facility] Hunger Vital Sign Worried About Running Out of Food in the Last Year: Sometimes true Ran Out of Food in the Last Year: Sometimes true Housing Stability: Unknown (07/24/2023) Received from [facility] Stability Vital Sign Unable to Pay for Housing in the Last Year: No Homeless in the Last Year: No Utilities: At Risk (07/24/2023) Received from [facility] Utilities Threatened with loss of utilities: Yes   No data to display     Outpatient Medications Prior to Visit Medication Sig Dispense Refill amLODIPine (NORVASC) 10 MG tablet Take 10 mg by mouth once daily atorvastatin (LIPITOR) 80 MG tablet Take 80 mg by mouth once daily cloNIDine HCL (CATAPRES) 0.2 MG tablet 1  tablet Orally twice a day FLUoxetine (PROZAC) 10 MG tablet Take 10 mg by mouth once daily FUROsemide (LASIX) 40 MG tablet Take 80 mg by mouth gabapentin (NEURONTIN) 300 MG capsule Take 300 mg by mouth once daily hydrOXYzine HCL (ATARAX) 10 MG tablet Take 10 mg by mouth 3 (three) times daily as needed lactulose (ENULOSE) 10 gram/15 mL oral solution Take 10 g by mouth LINZESS 290 mcg capsule TAKE 1 CAPSULE ONCE DAILY BEFORE BREAKFAST Oral for 90 Days omeprazole (PRILOSEC) 20 MG DR capsule polyethylene glycol (MIRALAX) packet Take 17 g by mouth once daily traZODone (DESYREL) 100 MG tablet Take 100 mg by mouth at bedtime TRESIBA FLEXTOUCH U-100 pen injector (concentration 100 units/mL) Inject 30 Units subcutaneously hydrALAZINE (APRESOLINE) 25 MG tablet Take 25 mg by mouth 2 (two) times daily INSULIN ASPART U-100 SUBQ Inject 10 Units subcutaneously  No facility-administered medications prior to visit.  Review of Systems Constitutional: Negative. HENT: Negative. Eyes: Negative. Respiratory: Negative. Cardiovascular: Negative. Gastrointestinal: Positive for abdominal pain. Genitourinary: Negative. Musculoskeletal: Negative. Skin: Negative. Neurological: Negative. Endo/Heme/Allergies: Negative. Psychiatric/Behavioral: Negative.   Objective  Vitals: 08/16/23 1353 BP: (!) 152/70 Pulse: (!) 112 Temp: 36.4 C (97.6 F) SpO2: 98% Weight: (!) 102 kg (224 lb 12.8 oz) Height: 162.6 cm (5\' 4" ) PainSc: 0-No pain  Body mass index is 38.59 kg/m. Physical Exam Constitutional: Appearance: Normal appearance. HENT: Head: Normocephalic and atraumatic. Pulmonary: Effort: Pulmonary effort is normal. Abdominal: Comments: Pain in lower right quadrant on palpation Musculoskeletal: General: Normal range of motion. Cervical back: Normal range of motion. Neurological: General: No focal deficit present. Mental Status: She is alert and oriented to person, place, and time.  Mental status is at  baseline. Psychiatric: Mood and Affect: Mood normal. Behavior: Behavior normal. Thought Content: Thought content normal.    Physical Exam MEASUREMENTS: Weight- 99.4. ABDOMEN: Tenderness in the abdomen.  I reviewed Ct scan images showing a lower abdominal ventral hernia with 4 cm defect  Assessment/Plan:  Assessment & Plan Incisional hernia Incisional hernia likely due to previous C-sections, located in rectus muscle near bladder. May contribute to constipation. Plan for robotic repair, possibly with concurrent hysterectomy by Dr. Pricilla Holm. - Perform robotic hernia repair with mesh placement. The patient has a symptomatic reducible hernia. We discussed the etiology of his hernia, the risk of it enlarging, incarceration, obstruction, strangulation, and that it is unlikely to get smaller or better on its own. We discussed operative options of laparoscopic vs open repair with mesh including the risks of recurrence, injury to intestines or abdominal organs, or chronic pain associated with mesh. We decided to proceed with robotic ventral hernia repair with mesh at same time as hysterectom.  - Coordinate with Dr. Pricilla Holm for concurrent hysterectomy. - Keep her overnight for observation post-surgery. - Discuss potential post-operative pain management strategies. - Consider use of abdominal binder post-operatively if comfortable.  Constipation Severe constipation improving with medication. May be related to hernia, diabetes, or kidney issues. Hernia repair may alleviate symptoms.  Diabetes mellitus Diabetes with HbA1c of 6.7%, indicating good control. May contribute to constipation and complicate surgical recovery.  End-stage renal disease on dialysis On dialysis three times a week and on kidney transplant list. Lost weight to qualify but lacks post-transplant caregiver.  Follow-up Confusion about surgery location; prefers [facility] for dialysis services. - Confirm surgery location with Dr.  Pricilla Holm and ensure it aligns with her dialysis needs. - Coordinate transportation to the correct surgical facility.  Diagnoses and all orders for this visit:  Class 2 severe obesity with body mass index (BMI) of 35 to 39.9 with serious comorbidity (CMS/HHS-HCC)  Incisional hernia without obstruction or gangrene  ESRD (end stage renal disease) (CMS/HHS-HCC)  Tobacco abuse  Type 2 diabetes mellitus with chronic kidney disease on chronic dialysis, with long-term current use of insulin (CMS/HHS-HCC)  This note has been created using automated tools and reviewed for accuracy by Rodman Pickle.   Electronically signed by Rodman Pickle, MD at 08/16/2023 4:06 PM EDT

## 2023-08-24 ENCOUNTER — Telehealth: Payer: Self-pay | Admitting: Oncology

## 2023-08-24 ENCOUNTER — Encounter (HOSPITAL_COMMUNITY): Payer: Self-pay | Admitting: Gynecologic Oncology

## 2023-08-24 DIAGNOSIS — N9489 Other specified conditions associated with female genital organs and menstrual cycle: Secondary | ICD-10-CM | POA: Diagnosis not present

## 2023-08-24 LAB — BASIC METABOLIC PANEL
Anion gap: 15 (ref 5–15)
BUN: 44 mg/dL — ABNORMAL HIGH (ref 6–20)
CO2: 23 mmol/L (ref 22–32)
Calcium: 8.7 mg/dL — ABNORMAL LOW (ref 8.9–10.3)
Chloride: 96 mmol/L — ABNORMAL LOW (ref 98–111)
Creatinine, Ser: 6.81 mg/dL — ABNORMAL HIGH (ref 0.44–1.00)
GFR, Estimated: 7 mL/min — ABNORMAL LOW (ref >60)
Glucose, Bld: 192 mg/dL — ABNORMAL HIGH (ref 70–99)
Potassium: 3.9 mmol/L (ref 3.5–5.1)
Sodium: 134 mmol/L — ABNORMAL LOW (ref 135–145)

## 2023-08-24 LAB — CBC
HCT: 37.8 % (ref 36.0–46.0)
Hemoglobin: 11.5 g/dL — ABNORMAL LOW (ref 12.0–15.0)
MCH: 26.5 pg (ref 26.0–34.0)
MCHC: 30.4 g/dL (ref 30.0–36.0)
MCV: 87.1 fL (ref 80.0–100.0)
Platelets: 190 10*3/uL (ref 150–400)
RBC: 4.34 MIL/uL (ref 3.87–5.11)
RDW: 12.8 % (ref 11.5–15.5)
WBC: 15.1 10*3/uL — ABNORMAL HIGH (ref 4.0–10.5)
nRBC: 0 % (ref 0.0–0.2)

## 2023-08-24 LAB — GLUCOSE, CAPILLARY: Glucose-Capillary: 245 mg/dL — ABNORMAL HIGH (ref 70–99)

## 2023-08-24 MED ORDER — CLONIDINE HCL 0.1 MG PO TABS
0.2000 mg | ORAL_TABLET | Freq: Two times a day (BID) | ORAL | Status: DC
Start: 2023-08-24 — End: 2023-08-24
  Administered 2023-08-24: 0.2 mg via ORAL
  Filled 2023-08-24: qty 2

## 2023-08-24 MED ORDER — FUROSEMIDE 40 MG PO TABS
80.0000 mg | ORAL_TABLET | Freq: Two times a day (BID) | ORAL | Status: DC
Start: 2023-08-24 — End: 2023-08-24
  Administered 2023-08-24: 80 mg via ORAL
  Filled 2023-08-24: qty 2

## 2023-08-24 MED ORDER — TRAMADOL HCL 50 MG PO TABS: 50.0000 mg | ORAL_TABLET | Freq: Two times a day (BID) | ORAL | 0 refills | Status: DC | PRN

## 2023-08-24 NOTE — Progress Notes (Signed)
 AVS reviewed w/ pt who verbalized an understanding- no other questions at this time- cane- quad cane delivered to pt in room - adjusted for pt height &comfort- directions on cane in wrapper. Binder delivered as patient was leaving. PIV x 2 removed as noted by primary nurse. Pt dressed- to car in w/c.

## 2023-08-24 NOTE — Telephone Encounter (Signed)
 Called [facility] and spoke to [employer].  Asked if Reene could have dialysis today at 12:30 - 1:00 due to her creatinine level.  She said [facility] had already called and she told her that she would need to get there before 12:00.  Their last chair time is 11:15 but they can get her in as long as she is there before 12:00.

## 2023-08-24 NOTE — Progress Notes (Signed)
 1 Day Post-Op Procedure(s) (LRB): HYSTERECTOMY, TOTAL, ROBOT-ASSISTED, LAPAROSCOPIC, WITH BILATERAL SALPINGO-OOPHORECTOMY, LYSIS OF ADHESIONS (Bilateral) OPEN PRIMARY REPAIR INCISIONAL HERNIA (N/A)  Subjective: Patient reports abdominal soreness that is manageable and better than yesterday. She has been up this am ambulating in the room with no issues. Requesting a cane for home use for extra support. Tolerating diet with no nausea or emesis. Has not passed flatus. No BM. Denies chest pain. Mild shortness of breath, using IS. Voided this am. No concerns voiced. Agreeable with plan for dialysis this afternoon as an outpatient given her current creatinine level. Original plan was for dialysis tomorrow but given creatinine level this am, plan for dialysis this afternoon.  Objective: Vital signs in last 24 hours: Temp:  [97.4 F (36.3 C)-98.7 F (37.1 C)] 98.1 F (36.7 C) (03/21 0508) Pulse Rate:  [96-109] 109 (03/21 0508) Resp:  [15-28] 18 (03/21 0508) BP: (116-175)/(61-91) 150/78 (03/21 0508) SpO2:  [92 %-100 %] 100 % (03/21 0508) Weight:  [223 lb 6.4 oz (101.3 kg)-224 lb 10.4 oz (101.9 kg)] 224 lb 10.4 oz (101.9 kg) (03/21 0452) Last BM Date : 08/19/23  Intake/Output from previous day: 03/20 0701 - 03/21 0700 In: 1900 [P.O.:600; I.V.:1000; IV Piggyback:300] Out: 525 [Urine:450; Blood:75]  Physical Examination: General: alert, cooperative, and no distress Resp: mild expiratory crackles bilaterally Cardio: regular rate and rhythm, S1, S2 normal, no murmur, click, rub or gallop GI: incision: lap sites and lower transverse abdominal incision intact with no drainage and abdomen obese, soft, active bowel sounds Extremities: extremities normal, atraumatic, no cyanosis or edema SCDs on  Labs: WBC/Hgb/Hct/Plts:  15.1/11.5/37.8/190 (03/21 1610) BUN/Cr/glu/ALT/AST/amyl/lip:  44/6.81/--/--/--/--/-- (03/21 9604)  Assessment: 53 y.o. s/p Procedure(s): HYSTERECTOMY, TOTAL, ROBOT-ASSISTED,  LAPAROSCOPIC, WITH BILATERAL SALPINGO-OOPHORECTOMY, LYSIS OF ADHESIONS, OPEN PRIMARY REPAIR INCISIONAL HERNIA: stable Pain:  Pain is well-controlled on PRN medications.  Heme: Hgb 11.5 and Hct 37.8 this am. Appropriate given preop values and surgical losses.  CV: BP and HR overall stable. Slightly elevated at times. Mildly tachycardic-lasix reordered this am.   GI:  Tolerating po: yes. Antiemetics ordered prn.  GU: Voided this am. Dialysis patient. Creatinine at 6.81 this am-plan for discharge this am and outpatient dialysis this afternoon.    FEN: No critical values on am labs.   Endo: Diabetes mellitus Type II, under good control.  CBG: CBG (last 3)  Recent Labs    08/23/23 1433 08/23/23 2106 08/24/23 0746  GLUCAP 133* 222* 245*     Prophylaxis: SCDs and heparin.  Plan: Lasix ordered now Diet as tolerated Plan for discharge later this am with plans for outpatient dialysis at her regular center this afternoon Heparin for this am discontinued due to dialysis treatment later today Pt requesting cane for extra support   LOS: 0 days    Doylene Bode 08/24/2023, 8:05 AM

## 2023-08-24 NOTE — TOC Initial Note (Signed)
 Transition of Care [facility]) - Initial/Assessment Note    Patient Details  Name: Samantha Pierce MRN: [id number] Date of Birth: 1970/10/28  Transition of [facility]) CM/SW Contact:    Adrian Prows, RN Phone Number: 08/24/2023, 9:27 AM  Clinical Narrative:                 Spoke w/ pt in room; pt says she lives at home w/ her sons; she plans to return at d/c; she identified POC Wilber Bihari [phone]); pt says her son will provide transportation; she denies SDOH risk; pt verified PCP/insurance; she does not have DME, HH services, or home oxygen; pt agrees to recc cane; she does not have an agency preference; spoke w/ April Greene at [facility]; she says agency can provide DME, and it will be delivered to pt's room; pt notified; no TOC needs.  Expected Discharge Plan: Home/Self Care Barriers to Discharge: No Barriers Identified   Patient Goals and CMS Choice Patient states their goals for this hospitalization and ongoing recovery are:: home CMS Medicare.gov Compare Post Acute Care list provided to:: Patient   [place] ownership interest in [facility].provided to:: Patient    Expected Discharge Plan and Services   Discharge Planning Services: CM Consult Post Acute Care Choice: Durable Medical Equipment Living arrangements for the past 2 months: Apartment Expected Discharge Date: 08/24/23               DME Arranged: Gilmer Mor DME Agency: [employer] Date DME Agency Contacted: 08/24/23 Time DME Agency Contacted: [phone] Representative spoke with at DME Agency: April Greene HH Arranged: NA HH Agency: NA        Prior Living Arrangements/Services Living arrangements for the past 2 months: Apartment Lives with:: Adult Children Patient language and need for interpreter reviewed:: Yes Do you feel safe going back to the place where you live?: Yes      Need for Family Participation in Patient Care: Yes (Comment) Care giver support system in place?: Yes  (comment) Current home services:  (n/a) Criminal Activity/Legal Involvement Pertinent to Current Situation/Hospitalization: No - Comment as needed  Activities of Daily Living      Permission Sought/Granted Permission sought to share information with : Case Manager Permission granted to share information with : Yes, Verbal Permission Granted  Share Information with NAME: Case Manager     Permission granted to share info w Relationship: Wilber Bihari (son) [phone]     Emotional Assessment Appearance:: Appears stated age Attitude/Demeanor/Rapport: Gracious Affect (typically observed): Accepting Orientation: : Oriented to Self, Oriented to Place, Oriented to  Time, Oriented to Situation Alcohol / Substance Use: Not Applicable Psych Involvement: No (comment)  Admission diagnosis:  Adnexal mass [N94.89] Ovarian mass [N83.8] Patient Active Problem List   Diagnosis Date Noted   Adnexal mass 08/23/2023   Ovarian mass 08/23/2023   Bilateral carpal tunnel syndrome 08/09/2023   Cyst of ovary 07/27/2023   History of stroke 07/27/2023   Arteriovenous fistula (HCC) 11/05/2020   Dependence on renal dialysis (HCC) 11/05/2020   Immunodeficiency, unspecified (HCC) 11/05/2020   Secondary hyperparathyroidism of renal origin (HCC) 11/05/2020   ESRD (end stage renal disease) (HCC) 10/05/2020   Anemia in chronic kidney disease (CODE) 04/03/2019   Other lipoprotein metabolism disorders 04/03/2019   Traumatic hematoma of left upper arm 03/03/2019   Chronic kidney disease (CKD), stage IV (severe) (HCC) 02/26/2019   Tobacco abuse 01/20/2019   Cocaine abuse (HCC) 01/20/2019   Uncontrolled type 2 diabetes mellitus  with hyperglycemia, with long-term current use of insulin (HCC) 01/20/2019   Acute diastolic CHF (congestive heart failure) (HCC) 01/20/2019   Dyspnea 01/19/2019   AKI (acute kidney injury) (HCC) 11/19/2017   COPD (chronic obstructive pulmonary disease) (HCC) 11/19/2017   GERD  (gastroesophageal reflux disease) 11/19/2017   Acute kidney failure, unspecified (HCC) 11/19/2017   Mild CAD 06/02/2015   Bradycardia 06/02/2015   Morbid obesity (HCC)    Asystole (HCC)    Pain in the chest    Hyperlipidemia    TIA (transient ischemic attack) 05/22/2015   Diastolic dysfunction 09/05/2014   Essential hypertension 09/04/2014   Uncontrolled diabetes mellitus with stage 4 chronic kidney disease 09/04/2014   Lacunar stroke, acute (HCC) 09/04/2014   Paresthesias/numbness 09/04/2014   CKD (chronic kidney disease), stage III (HCC) 09/04/2014   Obesity 09/04/2014   Cerebrovascular accident (CVA) (HCC) 09/04/2014   Proliferative diabetic retinopathy of both eyes (HCC) 04/11/2011   Retinal detachment, traction 04/11/2011   PCP:  Estevan Oaks, NP Pharmacy:   [facility] - [place], [place] - [address] [place] [address] Phone: [phone] Fax: [phone]  [id number] - [place], [place] - [address] AT [facility] [address] [place] [address] Phone: [phone] Fax: [phone]  [facility] - [place], [place] - [address] [place] [address] Phone: [phone] Fax: [phone]     Social Drivers of Health (SDOH) Social History: SDOH Screenings   Food Insecurity: No Food Insecurity (08/24/2023)  Recent Concern: Food Insecurity - Food Insecurity Present (07/24/2023)  Housing: Low Risk  (08/24/2023)  Transportation Needs: No Transportation Needs (08/24/2023)  Utilities: Not At Risk (08/24/2023)  Recent Concern: Utilities - At Risk (07/24/2023)  Depression (PHQ2-9): High Risk (07/24/2023)  Tobacco Use: High Risk (08/23/2023)   SDOH Interventions: Food Insecurity Interventions: Intervention Not Indicated, Inpatient TOC Housing Interventions: Intervention Not Indicated, Inpatient TOC Transportation  Interventions: Intervention Not Indicated, Inpatient TOC Utilities Interventions: Intervention Not Indicated, Inpatient TOC   Readmission Risk Interventions     No data to display

## 2023-08-24 NOTE — Discharge Summary (Signed)
 Physician Discharge Summary  Patient ID: Samantha Pierce MRN: [id number] DOB/AGE: 1971/01/31 53 y.o.  Admit date: 08/23/2023 Discharge date: 08/24/2023  Admission Diagnoses: Adnexal mass  Discharge Diagnoses:  Principal Problem:   Adnexal mass Active Problems:   Ovarian mass   Discharged Condition:  The patient is in good condition and stable for discharge.  Hospital Course: On 08/23/2023, the patient underwent the following: Procedure(s): HYSTERECTOMY, TOTAL, ROBOT-ASSISTED, LAPAROSCOPIC, WITH BILATERAL SALPINGO-OOPHORECTOMY, LYSIS OF ADHESIONS OPEN PRIMARY REPAIR INCISIONAL HERNIA. The postoperative course was uneventful.  She was discharged to home on postoperative day 1 tolerating a regular diet, ambulating, voiding, pain controlled. Lasix reordered POD 1 am. Patient overall doing very well and meeting milestones. Plan for dialysis this afternoon as an outpatient at her regular site given current creatinine level. Cane ordered for home use per patient request.     Consults: None  Significant Diagnostic Studies: Labs  Treatments: Surgery: see above  Discharge Exam: Blood pressure 138/62, pulse 95, temperature 98.2 F (36.8 C), temperature source Oral, resp. rate 18, height 5\' 4"  (1.626 m), weight 224 lb 10.4 oz (101.9 kg), SpO2 100%. General: alert, cooperative, and no distress Resp: mild expiratory crackles bilaterally-lasix reordered per Dr. Pricilla Holm Cardio: regular rate and rhythm, S1, S2 normal, no murmur, click, rub or gallop GI: incision: lap sites and lower transverse abdominal incision intact with no drainage and abdomen obese, soft, active bowel sounds Extremities: extremities normal, atraumatic, no cyanosis or edema SCDs on  Disposition: Discharge disposition: 01-Home or Self Care       Discharge Instructions     Call MD for:  difficulty breathing, headache or visual disturbances   Complete by: As directed    Call MD for:  extreme fatigue   Complete by: As  directed    Call MD for:  hives   Complete by: As directed    Call MD for:  persistant dizziness or light-headedness   Complete by: As directed    Call MD for:  persistant nausea and vomiting   Complete by: As directed    Call MD for:  redness, tenderness, or signs of infection (pain, swelling, redness, odor or green/yellow discharge around incision site)   Complete by: As directed    Call MD for:  severe uncontrolled pain   Complete by: As directed    Call MD for:  temperature >100.4   Complete by: As directed    Diet - low sodium heart healthy   Complete by: As directed    Driving Restrictions   Complete by: As directed    No driving for 6-29 days if you were cleared to drive before surgery and when the following criteria have been met: Do not take narcotics and drive. You need to make sure your reaction time has returned.   Increase activity slowly   Complete by: As directed    Lifting restrictions   Complete by: As directed    No lifting greater than 10 lbs, pushing, pulling, straining for 6 weeks.   Sexual Activity Restrictions   Complete by: As directed    No sexual activity, nothing in the vagina, for 10-12 weeks.      Allergies as of 08/24/2023       Reactions   Contrast Media [iodinated Contrast Media] Anaphylaxis   "code blue--I died"   Penicillins Rash, Other (See Comments)   Childhood        Medication List     TAKE these medications    amLODipine 10 MG  tablet Commonly known as: NORVASC Take 1 tablet (10 mg total) by mouth daily.   atorvastatin 80 MG tablet Commonly known as: LIPITOR Take 1 tablet (80 mg total) by mouth daily at 6 PM. What changed: when to take this   cloNIDine 0.2 MG tablet Commonly known as: CATAPRES Take 0.2 mg by mouth 2 (two) times daily.   Dexcom G6 Receiver Devi USE TO CHECK GLUCOSE FOUR TIMES DAILY AS DIRECTED.   diclofenac Sodium 1 % Gel Commonly known as: VOLTAREN One pump tid prn   FLUoxetine 40 MG  capsule Commonly known as: PROZAC Take 40 mg by mouth daily.   furosemide 80 MG tablet Commonly known as: LASIX Take 80 mg by mouth 2 (two) times daily.   gabapentin 300 MG capsule Commonly known as: NEURONTIN TAKE (1) CAPSULE BY MOUTH DAILY. What changed: See the new instructions.   hydrALAZINE 50 MG tablet Commonly known as: APRESOLINE Take 50 mg by mouth in the morning and at bedtime.   hydrOXYzine 10 MG tablet Commonly known as: ATARAX Take 10 mg by mouth 3 (three) times daily as needed for anxiety.   Ibsrela 50 MG Tabs Generic drug: Tenapanor HCl Take 50 mg by mouth 2 (two) times daily before a meal.   insulin lispro 100 UNIT/ML KwikPen Commonly known as: HumaLOG KwikPen Inject 4-10 Units into the skin 3 (three) times daily.   lactulose 10 GM/15ML solution Commonly known as: CHRONULAC Take 10 g by mouth daily.   lidocaine-prilocaine cream Commonly known as: EMLA Apply 1 Application topically as needed. One pump tid prn   OneTouch Delica Plus Lancet33G Misc Apply topically.   OneTouch Verio test strip Generic drug: glucose blood SMARTSIG:Via Meter   Ozempic (2 MG/DOSE) 8 MG/3ML Sopn Generic drug: Semaglutide (2 MG/DOSE) Inject 2 mg into the skin once a week.   polyethylene glycol 17 g packet Commonly known as: MiraLax Take 17 g by mouth daily.   PriLOSEC OTC 20 MG tablet Generic drug: omeprazole Take 20 mg by mouth daily before breakfast.   traMADol 50 MG tablet Commonly known as: ULTRAM Take 1 tablet (50 mg total) by mouth every 12 (twelve) hours as needed for moderate pain (pain score 4-6) (mild to moderate pain). For AFTER surgery only, do not take and drive   traZODone 696 MG tablet Commonly known as: DESYREL Take 100 mg by mouth at bedtime.   Evaristo Bury FlexTouch 100 UNIT/ML FlexTouch Pen Generic drug: insulin degludec Inject 30 Units into the skin at bedtime.               Durable Medical Equipment  (From admission, onward)            Start     Ordered   08/24/23 [phone]  For home use only DME Cane  Once       Comments: Quad point cane per case management   08/24/23 0938   08/24/23 0805  For home use only DME Cane  Once        08/24/23 8413            Follow-up Information     Carver Fila, MD Follow up on 08/29/2023.   Specialty: Gynecologic Oncology Why: will be a PHONE visit to check in and discuss pathology with Dr. Pricilla Holm. The time of the call is variable since she will be in surgery that day. IN PERSON visit will be on 09/21/23 at 4pm at the [facility]. Contact information: [address] [place]  [address] [phone]         Reine Just, PA-C Follow up on 09/25/2023.   Specialty: General Surgery Why: 4/22 at 10:00 for follow up regarding your hernia repair. Please bring a copy of your photo ID and insurance card. Please arrive 30 minutes prior to your appointment for paperwork. Contact information: [address] [place] [address] [phone]                 Greater than thirty minutes were spend for face to face discharge instructions and discharge orders/summary in EPIC.   Signed: Doylene Bode 08/24/2023, 11:56 AM

## 2023-08-24 NOTE — Progress Notes (Signed)
 Patient ID: Samantha Pierce, female   DOB: 26-Apr-1971, 53 y.o.   MRN: [id number]   Acute Care Surgery Service Progress Note:    Chief Complaint/Subjective: No n/v Soreness at LUQ incision, lower abd incision Tolerating diet Plan is outpt dialysis  Objective: Vital signs in last 24 hours: Temp:  [97.4 F (36.3 C)-98.7 F (37.1 C)] 98.1 F (36.7 C) (03/21 0508) Pulse Rate:  [96-109] 109 (03/21 0508) Resp:  [15-28] 18 (03/21 0508) BP: (116-175)/(61-91) 150/78 (03/21 0508) SpO2:  [92 %-100 %] 100 % (03/21 0508) Weight:  [101.3 kg-101.9 kg] 101.9 kg (03/21 0452) Last BM Date : 08/19/23  Intake/Output from previous day: 03/20 0701 - 03/21 0700 In: 1900 [P.O.:600; I.V.:1000; IV Piggyback:300] Out: 525 [Urine:450; Blood:75] Intake/Output this shift: No intake/output data recorded.  Lungs: cta, nonlabored  Cardiovascular: reg  Abd: soft, incisions c/d/I, approp mild TTP   Extremities: no edema, +SCDs  Neuro: alert, nonfocal  Lab Results: CBC  Recent Labs    08/23/23 0941 08/24/23 0438  WBC  --  15.1*  HGB 12.6 11.5*  HCT 37.0 37.8  PLT  --  190   BMET Recent Labs    08/23/23 0941 08/24/23 0438  NA 136 134*  K 3.7 3.9  CL 92* 96*  CO2  --  23  GLUCOSE 124* 192*  BUN 32* 44*  CREATININE 6.20* 6.81*  CALCIUM  --  8.7*   LFT    Latest Ref Rng & Units 06/25/2023    1:54 PM 02/22/2019   12:22 PM 01/19/2019   11:02 AM  Hepatic Function  Total Protein 6.5 - 8.1 g/dL 7.6  6.8  6.2   Albumin 3.5 - 5.0 g/dL 3.8  2.9  3.0   AST 15 - 41 U/L 20  16  13    ALT 0 - 44 U/L 19  13  18    Alk Phosphatase 38 - 126 U/L 48  60  54   Total Bilirubin 0.0 - 1.2 mg/dL 0.6  0.6  0.7    PT/INR No results for input(s): "LABPROT", "INR" in the last 72 hours. ABG No results for input(s): "PHART", "HCO3" in the last 72 hours.  Invalid input(s): "PCO2", "PO2"  Studies/Results:  Anti-infectives: Anti-infectives (From admission, onward)    Start     Dose/Rate Route  Frequency Ordered Stop   08/23/23 0800  ceFAZolin (ANCEF) IVPB 2g/100 mL premix        2 g 200 mL/hr over 30 Minutes Intravenous On call to O.R. 08/23/23 0758 08/23/23 1123   08/23/23 0800  metroNIDAZOLE (FLAGYL) IVPB 500 mg        500 mg 100 mL/hr over 60 Minutes Intravenous On call to O.R. 08/23/23 0758 08/23/23 1130       Medications: Scheduled Meds:  amLODipine  10 mg Oral Daily   atorvastatin  80 mg Oral QHS   cloNIDine  0.2 mg Oral BID   FLUoxetine  40 mg Oral Daily   furosemide  80 mg Oral BID   gabapentin  300 mg Oral Daily   insulin aspart  0-6 Units Subcutaneous TID WC   insulin glargine  15 Units Subcutaneous QHS   pantoprazole  40 mg Oral Daily   Tenapanor HCl  50 mg Oral BID AC   traZODone  100 mg Oral QHS   Continuous Infusions: PRN Meds:.HYDROcodone-acetaminophen, HYDROmorphone (DILAUDID) injection, ondansetron **OR** ondansetron (ZOFRAN) IV, traMADol  Assessment/Plan: Patient Active Problem List   Diagnosis Date Noted   Adnexal mass 08/23/2023  Ovarian mass 08/23/2023   Bilateral carpal tunnel syndrome 08/09/2023   Cyst of ovary 07/27/2023   History of stroke 07/27/2023   Arteriovenous fistula (HCC) 11/05/2020   Dependence on renal dialysis (HCC) 11/05/2020   Immunodeficiency, unspecified (HCC) 11/05/2020   Secondary hyperparathyroidism of renal origin (HCC) 11/05/2020   ESRD (end stage renal disease) (HCC) 10/05/2020   Anemia in chronic kidney disease (CODE) 04/03/2019   Other lipoprotein metabolism disorders 04/03/2019   Traumatic hematoma of left upper arm 03/03/2019   Chronic kidney disease (CKD), stage IV (severe) (HCC) 02/26/2019   Tobacco abuse 01/20/2019   Cocaine abuse (HCC) 01/20/2019   Uncontrolled type 2 diabetes mellitus with hyperglycemia, with long-term current use of insulin (HCC) 01/20/2019   Acute diastolic CHF (congestive heart failure) (HCC) 01/20/2019   Dyspnea 01/19/2019   AKI (acute kidney injury) (HCC) 11/19/2017   COPD  (chronic obstructive pulmonary disease) (HCC) 11/19/2017   GERD (gastroesophageal reflux disease) 11/19/2017   Acute kidney failure, unspecified (HCC) 11/19/2017   Mild CAD 06/02/2015   Bradycardia 06/02/2015   Morbid obesity (HCC)    Asystole (HCC)    Pain in the chest    Hyperlipidemia    TIA (transient ischemic attack) 05/22/2015   Diastolic dysfunction 09/05/2014   Essential hypertension 09/04/2014   Uncontrolled diabetes mellitus with stage 4 chronic kidney disease 09/04/2014   Lacunar stroke, acute (HCC) 09/04/2014   Paresthesias/numbness 09/04/2014   CKD (chronic kidney disease), stage III (HCC) 09/04/2014   Obesity 09/04/2014   Cerebrovascular accident (CVA) (HCC) 09/04/2014   Proliferative diabetic retinopathy of both eyes (HCC) 04/11/2011   Retinal detachment, traction 04/11/2011   s/p Procedure(s): HYSTERECTOMY, TOTAL, ROBOT-ASSISTED, LAPAROSCOPIC, WITH BILATERAL SALPINGO-OOPHORECTOMY, LYSIS OF ADHESIONS OPEN PRIMARY REPAIR INCISIONAL HERNIA 08/23/2023  Doing well from incisional hernia repair Ok for dc from our standpoint I think wbc is reactive/stress; no fever Will put f/u on chart Discussed with pt discharge instructions regarding hernia surgery Pt is scheduled for outpt HD  Disposition: see above  LOS: 0 days    Samantha Pierce. Samantha Campanile, MD, FACS General, Bariatric, & Minimally Invasive Surgery [phone] [facility]

## 2023-08-24 NOTE — Plan of Care (Signed)
  Problem: Education: Goal: Knowledge of General Education information will improve Description: Including pain rating scale, medication(s)/side effects and non-pharmacologic comfort measures Outcome: Adequate for Discharge   Problem: Health Behavior/Discharge Planning: Goal: Ability to manage health-related needs will improve Outcome: Adequate for Discharge   Problem: Clinical Measurements: Goal: Ability to maintain clinical measurements within normal limits will improve Outcome: Adequate for Discharge Goal: Will remain free from infection Outcome: Adequate for Discharge Goal: Diagnostic test results will improve Outcome: Adequate for Discharge Goal: Respiratory complications will improve Outcome: Adequate for Discharge Goal: Cardiovascular complication will be avoided Outcome: Adequate for Discharge   Problem: Activity: Goal: Risk for activity intolerance will decrease Outcome: Adequate for Discharge   Problem: Nutrition: Goal: Adequate nutrition will be maintained Outcome: Adequate for Discharge   Problem: Coping: Goal: Level of anxiety will decrease Outcome: Adequate for Discharge   Problem: Elimination: Goal: Will not experience complications related to bowel motility Outcome: Adequate for Discharge Goal: Will not experience complications related to urinary retention Outcome: Adequate for Discharge   Problem: Pain Managment: Goal: General experience of comfort will improve and/or be controlled Outcome: Adequate for Discharge   Problem: Safety: Goal: Ability to remain free from injury will improve Outcome: Adequate for Discharge   Problem: Skin Integrity: Goal: Risk for impaired skin integrity will decrease Outcome: Adequate for Discharge   Problem: Education: Goal: Ability to describe self-care measures that may prevent or decrease complications (Diabetes Survival Skills Education) will improve Outcome: Adequate for Discharge Goal: Individualized Educational  Video(s) Outcome: Adequate for Discharge   Problem: Coping: Goal: Ability to adjust to condition or change in health will improve Outcome: Adequate for Discharge   Problem: Fluid Volume: Goal: Ability to maintain a balanced intake and output will improve Outcome: Adequate for Discharge   Problem: Health Behavior/Discharge Planning: Goal: Ability to identify and utilize available resources and services will improve Outcome: Adequate for Discharge Goal: Ability to manage health-related needs will improve Outcome: Adequate for Discharge   Problem: Metabolic: Goal: Ability to maintain appropriate glucose levels will improve Outcome: Adequate for Discharge   Problem: Nutritional: Goal: Maintenance of adequate nutrition will improve Outcome: Adequate for Discharge Goal: Progress toward achieving an optimal weight will improve Outcome: Adequate for Discharge   Problem: Skin Integrity: Goal: Risk for impaired skin integrity will decrease Outcome: Adequate for Discharge   Problem: Tissue Perfusion: Goal: Adequacy of tissue perfusion will improve Outcome: Adequate for Discharge   Problem: Education: Goal: Knowledge of the prescribed therapeutic regimen will improve Outcome: Adequate for Discharge Goal: Understanding of sexual limitations or changes related to disease process or condition will improve Outcome: Adequate for Discharge Goal: Individualized Educational Video(s) Outcome: Adequate for Discharge   Problem: Self-Concept: Goal: Communication of feelings regarding changes in body function or appearance will improve Outcome: Adequate for Discharge   Problem: Skin Integrity: Goal: Demonstration of wound healing without infection will improve Outcome: Adequate for Discharge

## 2023-08-27 ENCOUNTER — Telehealth: Payer: Self-pay | Admitting: *Deleted

## 2023-08-27 ENCOUNTER — Encounter: Payer: Self-pay | Admitting: Gynecologic Oncology

## 2023-08-27 LAB — CYTOLOGY - NON PAP

## 2023-08-27 LAB — SURGICAL PATHOLOGY

## 2023-08-27 NOTE — Telephone Encounter (Signed)
 Spoke with Samantha Pierce this afternoon.. She states she is eating, drinking and urinating well. She has had a BMand is passing gas. She is taking senokot as prescribed and encouraged her to drink plenty of water. She denies fever or chills. Incisions are dry and intact. She rates her pain 5/10. Her pain is controlled with tylenol and only takes tramadol once daily.     Instructed to call office with any fever, chills, purulent drainage, uncontrolled pain or any other questions or concerns. Patient verbalizes understanding.   Pt aware of post op appointments as well as the office number [phone] and after hours number [phone] to call if she has any questions or concerns

## 2023-08-27 NOTE — Telephone Encounter (Signed)
 Attempted to reach patient for post op call. Left voicemail requesting call back.

## 2023-08-28 NOTE — Telephone Encounter (Signed)
 Can we try to reach patient about this to see if she was able to get Ibsrela/speak to the specialty pharmacy?

## 2023-08-29 ENCOUNTER — Encounter: Payer: Self-pay | Admitting: Gynecologic Oncology

## 2023-08-29 ENCOUNTER — Inpatient Hospital Stay: Payer: 59 | Attending: Gynecologic Oncology | Admitting: Gynecologic Oncology

## 2023-08-29 ENCOUNTER — Other Ambulatory Visit: Payer: Self-pay | Admitting: Gastroenterology

## 2023-08-29 DIAGNOSIS — E669 Obesity, unspecified: Secondary | ICD-10-CM

## 2023-08-29 DIAGNOSIS — N939 Abnormal uterine and vaginal bleeding, unspecified: Secondary | ICD-10-CM

## 2023-08-29 DIAGNOSIS — R978 Other abnormal tumor markers: Secondary | ICD-10-CM

## 2023-08-29 DIAGNOSIS — K581 Irritable bowel syndrome with constipation: Secondary | ICD-10-CM

## 2023-08-29 DIAGNOSIS — N83209 Unspecified ovarian cyst, unspecified side: Secondary | ICD-10-CM

## 2023-08-29 MED ORDER — LUBIPROSTONE 24 MCG PO CAPS: 24.0000 ug | ORAL_CAPSULE | Freq: Two times a day (BID) | ORAL | 3 refills | Status: DC

## 2023-08-29 NOTE — Telephone Encounter (Signed)
 Please let patient know that we received a denial letter for Ibsrela.  Insurance has requested that she try either lubiprostone or Trulance. As far as I know, she has never taken either medication. We can try her on lubiprostone 24 mcg twice daily with a meal.   I am sending the Rx for Amitiza to her pharmacy.

## 2023-08-29 NOTE — Progress Notes (Signed)
 Gynecologic Oncology Telehealth Note: Gyn-Onc  I connected with Samantha Pierce on 08/29/23 at  6:00 PM EDT by telephone and verified that I am speaking with the correct person using two identifiers.  I discussed the limitations, risks, security and privacy concerns of performing an evaluation and management service by telemedicine and the availability of in-person appointments. I also discussed with the patient that there may be a patient responsible charge related to this service. The patient expressed understanding and agreed to proceed.  Other persons participating in the visit and their role in the encounter: none.  Patient's location: home Provider's location: [facility]  Reason for Visit: follow-up   Treatment History: 08/23/23: Extensive lysis of adhesions, robotic-assisted laparoscopic total hysterectomy with bilateral salpingo-oophorectomy   Interval History: Doing well. Feeling better each day. Bowels moving well, solid stool soft. Dialysis has been hard, today was first day she was able to do 4 hours since surgery.   Past Medical/Surgical History: Past Medical History:  Diagnosis Date   Anemia    Anxiety    Asystole (HCC)    a. 05/2015: pt developed bradycardia->asystole during Lexiscan nuclear stress test, s/p brief code. Cath with only 30% prox LCx. Her asystole during Lexiscan infusion was felt to represent an excessive pharmacologic response to the agent and likely superimposed vasovagal response.   Bipolar disorder (HCC)    CHF (congestive heart failure) (HCC)    CKD (chronic kidney disease) stage 4, GFR 15-29 ml/min (HCC)    M_W_F dialysis   COPD (chronic obstructive pulmonary disease) (HCC)    Depression    Essential hypertension    GERD (gastroesophageal reflux disease)    History of blood transfusion 2011   History of seizure 2017   only one seizure - none since   History of stroke 09/2014   Acute lacunar infarct of the left thalamus   Leg cramps    Mild CAD     a. LHC 05/2015: 30% prox Cx, otherwise widely patent.   Morbid obesity (HCC)    Neuromuscular disorder (HCC)    neuropathy in feet   Neuropathy    feet   PTSD (post-traumatic stress disorder)    Retinal detachment    legally blind in right eye   Stroke (HCC)    x 2, right side weakness   Type 2 diabetes mellitus (HCC)     Past Surgical History:  Procedure Laterality Date   AV FISTULA PLACEMENT Right 01/08/2017   Procedure: RIGHT ARM BRACHIOCEPHALIC ARTERIOVENOUS (AV) FISTULA CREATION;  Surgeon: Maeola Harman, MD;  Location: [facility] OR;  Service: Vascular;  Laterality: Right;   AV FISTULA PLACEMENT Left 11/20/2018   Procedure: Creation of LEFT ARM ARTERIOVENOUS (AV) FISTULA;  Surgeon: Maeola Harman, MD;  Location: [facility] OR;  Service: Vascular;  Laterality: Left;   AV FISTULA PLACEMENT Right 02/24/2021   Procedure: RIGHT UPPER ARM BASILIC VEIN ARTERIOVENOUS (AV) FISTULA CREATION;  Surgeon: Leonie Douglas, MD;  Location: [facility] OR;  Service: Vascular;  Laterality: Right;   AV FISTULA PLACEMENT Right 05/05/2021   Procedure: INSERTION OF ARTERIOVENOUS (AV) GORE-TEX GRAFT ARM;  Surgeon: Leonie Douglas, MD;  Location: [facility] OR;  Service: Vascular;  Laterality: Right;   BASCILIC VEIN TRANSPOSITION Left 02/11/2019   Procedure: BASILIC VEIN TRANSPOSITION SECOND STAGE;  Surgeon: Maeola Harman, MD;  Location: [facility] OR;  Service: Vascular;  Laterality: Left;   CARDIAC CATHETERIZATION N/A 06/01/2015   Procedure: Left Heart Cath and Coronary Angiography;  Surgeon: Lyn Records, MD; CFX  30%, no other dz, EF nl by echo   CESAREAN SECTION     x2   COLONOSCOPY WITH PROPOFOL N/A 07/27/2022   Procedure: COLONOSCOPY WITH PROPOFOL;  Surgeon: Lanelle Bal, DO;  Location: [facility] ENDO SUITE;  Service: Endoscopy;  Laterality: N/A;  930am, asa 3, dialysis pt   DILATION AND CURETTAGE OF UTERUS     EYE SURGERY     Right eye pars plano vitrectomy    HEMATOMA EVACUATION Left 03/03/2019    Procedure: EVACUATION HEMATOMA;  Surgeon: Sherren Kerns, MD;  Location: [facility] OR;  Service: Vascular;  Laterality: Left;   INSERTION OF DIALYSIS CATHETER Right 01/04/2021   Procedure: INSERTION OF RIGHT INTERNAL JUGULAR TUNNELED DIALYSIS CATHETER;  Surgeon: Sherren Kerns, MD;  Location: [facility] OR;  Service: Vascular;  Laterality: Right;   LASIK     LIGATION OF ARTERIOVENOUS  FISTULA Left 01/04/2021   Procedure: LIGATION OF LEFT ARM ARTERIOVENOUS  FISTULA;  Surgeon: Sherren Kerns, MD;  Location: [facility] OR;  Service: Vascular;  Laterality: Left;   PARS PLANA VITRECTOMY  04/20/2011   Procedure: PARS PLANA VITRECTOMY WITH 25 GAUGE;  Surgeon: Sherrie George, MD;  Location: [facility] OR;  Service: Ophthalmology;  Laterality: Left;  membrane peel, gas fluid exchange, endolaser, repair of complex retinal detachment   PATCH ANGIOPLASTY Left 10/05/2020   Procedure: PATCH ANGIOPLASTY USING Livia Snellen BIOLOGIC PATCH;  Surgeon: Sherren Kerns, MD;  Location: [facility] OR;  Service: Vascular;  Laterality: Left;   PERIPHERAL VASCULAR BALLOON ANGIOPLASTY Left 09/02/2020   Procedure: PERIPHERAL VASCULAR BALLOON ANGIOPLASTY;  Surgeon: Cephus Shelling, MD;  Location: [facility] INVASIVE CV LAB;  Service: Cardiovascular;  Laterality: Left;  arm fistula   REMOVAL OF A DIALYSIS CATHETER N/A 06/21/2021   Procedure: MINOR REMOVAL OF TUNNELED DIALYSIS CATHETER;  Surgeon: Larina Earthly, MD;  Location: [facility] ORS;  Service: Vascular;  Laterality: N/A;   REVISON OF ARTERIOVENOUS FISTULA Left 10/05/2020   Procedure: REVISION OF LEFT ARM ARTERIOVENOUS FISTULA;  Surgeon: Sherren Kerns, MD;  Location: [facility] OR;  Service: Vascular;  Laterality: Left;   ROBOTIC ASSISTED LAPAROSCOPIC VENTRAL/INCISIONAL HERNIA REPAIR N/A 08/23/2023   Procedure: OPEN PRIMARY REPAIR INCISIONAL HERNIA;  Surgeon: [name], MD;  Location: [facility] ORS;  Service: General;  Laterality: N/A;  with Mesh   ROBOTIC ASSISTED TOTAL HYSTERECTOMY WITH BILATERAL SALPINGO OOPHERECTOMY Bilateral  08/23/2023   Procedure: HYSTERECTOMY, TOTAL, ROBOT-ASSISTED, LAPAROSCOPIC, WITH BILATERAL SALPINGO-OOPHORECTOMY, LYSIS OF ADHESIONS;  Surgeon: Carver Fila, MD;  Location: [facility] ORS;  Service: Gynecology;  Laterality: Bilateral;   TUBAL LIGATION      Family History  Problem Relation Age of Onset   Diabetes Mother    Kidney failure Mother    Diabetes Father    Amblyopia Neg Hx    Blindness Neg Hx    Cataracts Neg Hx    Glaucoma Neg Hx    Macular degeneration Neg Hx    Retinal detachment Neg Hx    Strabismus Neg Hx    Retinitis pigmentosa Neg Hx     Social History   Socioeconomic History   Marital status: Single    Spouse name: Not on file   Number of children: Not on file   Years of education: Not on file   Highest education level: Not on file  Occupational History   Not on file  Tobacco Use   Smoking status: Every Day    Current packs/day: 0.00    Average packs/day: (1.2 ttl pk-yrs)    Types:  Cigarettes    Start date: 06/05/1981    Last attempt to quit: 06/2022    Years since quitting: 1.2   Smokeless tobacco: Never   Tobacco comments:    Quit in 12/2018 but now smokes off and on per patient 05/04/21.  Vaping Use   Vaping status: Never Used  Substance and Sexual Activity   Alcohol use: Yes    Comment: occ wine   Drug use: No   Sexual activity: Yes    Birth control/protection: Surgical    Comment: Tubal Ligation  Other Topics Concern   Not on file  Social History Narrative   Not on file   Social Drivers of Health   Financial Resource Strain: Not on file  Food Insecurity: No Food Insecurity (08/24/2023)   Hunger Vital Sign    Worried About Running Out of Food in the Last Year: Never true    Ran Out of Food in the Last Year: Never true  Recent Concern: Food Insecurity - Food Insecurity Present (07/24/2023)   Hunger Vital Sign    Worried About Running Out of Food in the Last Year: Sometimes true    Ran Out of Food in the Last Year: Sometimes true   Transportation Needs: No Transportation Needs (08/24/2023)   PRAPARE - Administrator, Civil Service (Medical): No    Lack of Transportation (Non-Medical): No  Physical Activity: Not on file  Stress: Not on file  Social Connections: Not on file    Current Medications:  Current Outpatient Medications:    amLODipine (NORVASC) 10 MG tablet, Take 1 tablet (10 mg total) by mouth daily., Disp: 30 tablet, Rfl: 1   atorvastatin (LIPITOR) 80 MG tablet, Take 1 tablet (80 mg total) by mouth daily at 6 PM. (Patient taking differently: Take 80 mg by mouth at bedtime.), Disp: 30 tablet, Rfl: 6   cloNIDine (CATAPRES) 0.2 MG tablet, Take 0.2 mg by mouth 2 (two) times daily., Disp: , Rfl:    Continuous Blood Gluc Receiver (DEXCOM G6 RECEIVER) DEVI, USE TO CHECK GLUCOSE FOUR TIMES DAILY AS DIRECTED., Disp: 1 each, Rfl: 0   diclofenac Sodium (VOLTAREN) 1 % GEL, One pump tid prn, Disp: 100 g, Rfl: 11   FLUoxetine (PROZAC) 40 MG capsule, Take 40 mg by mouth daily., Disp: , Rfl:    furosemide (LASIX) 80 MG tablet, Take 80 mg by mouth 2 (two) times daily., Disp: , Rfl:    gabapentin (NEURONTIN) 300 MG capsule, TAKE (1) CAPSULE BY MOUTH DAILY. (Patient taking differently: Take 900 mg by mouth 2 (two) times daily.), Disp: 30 capsule, Rfl: 0   hydrALAZINE (APRESOLINE) 50 MG tablet, Take 50 mg by mouth in the morning and at bedtime., Disp: , Rfl:    hydrOXYzine (ATARAX) 10 MG tablet, Take 10 mg by mouth 3 (three) times daily as needed for anxiety., Disp: , Rfl:    insulin degludec (TRESIBA FLEXTOUCH) 100 UNIT/ML FlexTouch Pen, Inject 30 Units into the skin at bedtime., Disp: 30 mL, Rfl: 3   insulin lispro (HUMALOG KWIKPEN) 100 UNIT/ML KwikPen, Inject 4-10 Units into the skin 3 (three) times daily., Disp: 30 mL, Rfl: 3   lactulose (CHRONULAC) 10 GM/15ML solution, Take 10 g by mouth daily., Disp: , Rfl:    Lancets (ONETOUCH DELICA PLUS LANCET33G) MISC, Apply topically., Disp: , Rfl:    lidocaine-prilocaine  (EMLA) cream, Apply 1 Application topically as needed. One pump tid prn, Disp: 30 g, Rfl: 11   lubiprostone (AMITIZA) 24 MCG capsule, Take  1 capsule (24 mcg total) by mouth 2 (two) times daily with a meal., Disp: 60 capsule, Rfl: 3   omeprazole (PRILOSEC OTC) 20 MG tablet, Take 20 mg by mouth daily before breakfast., Disp: , Rfl:    ONETOUCH VERIO test strip, SMARTSIG:Via Meter (Patient not taking: Reported on 08/09/2023), Disp: , Rfl:    polyethylene glycol (MIRALAX) 17 g packet, Take 17 g by mouth daily. (Patient not taking: Reported on 08/13/2023), Disp: 14 each, Rfl: 0   Semaglutide, 2 MG/DOSE, (OZEMPIC, 2 MG/DOSE,) 8 MG/3ML SOPN, Inject 2 mg into the skin once a week., Disp: 9 mL, Rfl: 1   traMADol (ULTRAM) 50 MG tablet, Take 1 tablet (50 mg total) by mouth every 12 (twelve) hours as needed for moderate pain (pain score 4-6) (mild to moderate pain). For AFTER surgery only, do not take and drive, Disp: 10 tablet, Rfl: 0   traZODone (DESYREL) 100 MG tablet, Take 100 mg by mouth at bedtime., Disp: , Rfl:   Review of Symptoms: Pertinent positives as per HPI.  Physical Exam: Deferred given limitations of phone visit.  Laboratory & Radiologic Studies: A. ENDOMETRIAL, BIOPSY:      Benign inactive endometrium.      Benign endocervical glands and cervical squamous epithelium.      Negative for glandular hyperplasia, squamous intraepithelial lesion and malignancy.  B. LEFT TUBE AND OVARY:      Fibroma of ovary (1.5 cm).      Background ovarian parenchyma with inclusion cysts.      Benign fimbriated fallopian tube with paratubal cysts.      Negative for malignancy.  C. UTERUS, CERVIX, RIGHT TUBE AND OVARY: Cervix:           Unremarkable.           Negative for dysplasia or malignancy.       Endocervix:           Nabothian cysts.           Negative for hyperplasia, atypia or malignancy.       Endometrium:           Benign inactive endometrium.           Negative for hyperplasia, atypia  or malignancy.       Myometrium:           Leiomyomata.           Negative for malignancy.       Serosa:           Unremarkable.           Negative for malignancy.       Right fallopian tube:           Benign fimbriated fallopian tubes.           Negative for malignancy.       Right ovary:           Unremarkable.           Negative for malignancy.   Assessment & Plan: Samantha Pierce is a 53 y.o. woman with complex adnexal, AUB, hernia at site of prior c-section now s/p extensive LOA, TRH/BSO, repair of incisional hernia.  Overall doing well. Discussed continued expectations. Reviewed benign pathology.  I discussed the assessment and treatment plan with the patient. The patient was provided with an opportunity to ask questions and all were answered. The patient agreed with the plan and demonstrated an understanding of the instructions.   The patient was advised  to call back or see an in-person evaluation if the symptoms worsen or if the condition fails to improve as anticipated.   6 minutes of total time was spent for this patient encounter, including preparation, phone counseling with the patient and coordination of care, and documentation of the encounter.   Eugene Garnet, MD  Division of Gynecologic Oncology  Department of Obstetrics and Gynecology  [facility]

## 2023-08-30 NOTE — Telephone Encounter (Signed)
 LMOM for pt to call office

## 2023-08-31 ENCOUNTER — Other Ambulatory Visit: Payer: 59

## 2023-08-31 ENCOUNTER — Telehealth: Payer: Self-pay | Admitting: *Deleted

## 2023-08-31 NOTE — Telephone Encounter (Signed)
 Spoke with Samantha Pierce who called the office requesting nausea medicine. Pt reports having nausea and vomiting since she left the hospital on Friday, although spoke with patient on Monday, 3/24 for post op and she didn't mention N/V.   Pt denies fever, chills, pelvic pain and all urinary symptoms. Pt is passing gas and having regular bowel movements. Pt is only taking tramadol once in the evening, Pt only reports post op soreness that is tolerable. Pt states she is eating a light diet only and keeping herself well hydrated. Pt states the nausea is constant and she has thrown up at least once daily.  Advised pt her message would be relayed to providers and the office would call back.

## 2023-08-31 NOTE — Telephone Encounter (Signed)
What is her blood pressure?

## 2023-08-31 NOTE — Telephone Encounter (Signed)
 Spoke with Samantha Pierce who is currently at dialysis. Pt states they are checking her vitals signs every 4 hours. Last blood pressure 134/58. The nurse at dialysis reports patient hasn't had any low pressures.  Advised patient that her nausea and vomiting may not be related to surgery because pt states she is passing gas and having regular bowel movements. Advised patient to continue to monitor and reach out to a provider at the dialysis center for evaluation of her nausea and vomiting.

## 2023-08-31 NOTE — Telephone Encounter (Signed)
 Thank you - please plan to check in with her on Monday.

## 2023-09-03 NOTE — Telephone Encounter (Signed)
 Zofran is going to constipate her. She needs to be aggressive about bowel regimen (especially if taking Zofran)

## 2023-09-03 NOTE — Telephone Encounter (Signed)
 Spoke with Samantha Pierce in regards to her symptoms of nausea and vomiting. Pt states she hasn't felt nauseous and no vomiting over the weekend. Pt states she is currently at dialysis and she felt a little nauseous and they gave her a zofran.   Pt states she rested all weekend, but reports no bowel movement since Wednesday? Pt states she took a "laxative" from her medicine cabinet, "it was a red pill-but I don't know what it's called"  Advised pt to take the stool softeners twice daily and add miralax as well 1 capful two times a day. Pt also encouraged to ambulate, and encouraged to keep herself hydrated. Pt verbalized understanding and knows to call the office with any needs.

## 2023-09-03 NOTE — Telephone Encounter (Signed)
 LMOM for pt to call office

## 2023-09-03 NOTE — Telephone Encounter (Signed)
 Spoke with Ms. Karapetian and relayed message from Dr. Pricilla Holm. Pt states she has only received 1 dose of Zofran at dialysis last Wednesday, Friday and this morning. Reiterated to patient that Zofran can cause constipation and pt needs to be aggressive about bowel regimen. Pt states she will continue taking her laxative pills twice daily and will pick up miralax today. Advised patient the office would call back later in the week to check in. Pt thanked the office.

## 2023-09-11 ENCOUNTER — Ambulatory Visit: Payer: 59 | Admitting: Neurology

## 2023-09-12 ENCOUNTER — Encounter: Payer: Self-pay | Admitting: *Deleted

## 2023-09-12 NOTE — Telephone Encounter (Signed)
Tried several times to reach pt. Mailed a letter.  

## 2023-09-21 ENCOUNTER — Encounter: Payer: Self-pay | Admitting: Gynecologic Oncology

## 2023-09-21 ENCOUNTER — Inpatient Hospital Stay: Payer: 59 | Attending: Gynecologic Oncology | Admitting: Gynecologic Oncology

## 2023-09-21 VITALS — BP 115/61 | HR 90 | Temp 97.9°F | Resp 17 | Ht 64.0 in | Wt 218.0 lb

## 2023-09-21 DIAGNOSIS — Z90722 Acquired absence of ovaries, bilateral: Secondary | ICD-10-CM

## 2023-09-21 DIAGNOSIS — Z9079 Acquired absence of other genital organ(s): Secondary | ICD-10-CM

## 2023-09-21 DIAGNOSIS — N939 Abnormal uterine and vaginal bleeding, unspecified: Secondary | ICD-10-CM

## 2023-09-21 DIAGNOSIS — Z90710 Acquired absence of both cervix and uterus: Secondary | ICD-10-CM

## 2023-09-21 DIAGNOSIS — R112 Nausea with vomiting, unspecified: Secondary | ICD-10-CM

## 2023-09-21 DIAGNOSIS — N83209 Unspecified ovarian cyst, unspecified side: Secondary | ICD-10-CM

## 2023-09-21 NOTE — Patient Instructions (Signed)
 It was good to see you today! You are healing well from surgery.  Please remember, no heavy lifting for 6 weeks after surgery, and nothing in the vagina for at least 12 weeks.  I will have the office reach out to you next week.  If the nausea continues, we can get a CT scan to make sure that there is nothing related to the surgery.  The fact that the nausea is really only happening at the time of dialysis makes me think it is related to something during dialysis.

## 2023-09-21 NOTE — Progress Notes (Signed)
 Gynecologic Oncology Return Clinic Visit  09/21/23  Reason for Visit: follow-up  Treatment History: Samantha Pierce initially presented to [facility] emergency department in late January for constipation over a 2-week period.  Given abdominal tenderness, CT imaging was performed.  This showed a 5.9 cm complex left adnexal mass, cholelithiasis without cholecystitis, fat-containing lower right anterior abdominal wall ventral hernia, moderate stool burden throughout Samantha colon, and fibroid uterus.  No adenopathy or ascites noted.   She was referred for outpatient follow-up to GYN.  She was seen on 2/5 and underwent an office ultrasound which showed a uterus measuring 7 cm with multiple fibroids, Samantha largest of which measured up to 4.4 cm.  Endometrium was 4.1 mm.  Right ovary was normal in appearance.  Left ovary contained a 4.8 x 4.4 x 3.6 cm vascular complex cyst with a septation and a mural nodule measuring up to 1.8 cm.    Tumors were obtained on 2/5: CA 19-9: 9 CEA: 2.3 CA-125: 6 HE4: 670 Postmenopausal ROMA: 4.97  08/23/23: Extensive lysis of adhesions, robotic-assisted laparoscopic total hysterectomy with bilateral salpingo-oophorectomy Orvil Bland) Hernia repair Elvan Hamel)  Interval History: Overall doing okay.  Still struggling with nausea.  This mostly happens at dialysis and she feels it occasionally at home.  Has had some episodes of emesis, last on Monday at dialysis.  She notes improved bowel function compared to her baseline, having a bowel movement about every other day.  Using MiraLAX  and prunes.  Endorses a good appetite.  Abdominal pain/soreness is improving.  Denies any vaginal bleeding.  CBGs have been running <180 this week. Last week was in 200s.   Past Medical/Surgical History: Past Medical History:  Diagnosis Date   Anemia    Anxiety    Asystole (HCC)    a. 05/2015: pt developed bradycardia->asystole during Lexiscan  nuclear stress test, s/p brief code. Cath with only 30% prox LCx.  Her asystole during Lexiscan  infusion was felt to represent an excessive pharmacologic response to Samantha agent and likely superimposed vasovagal response.   Bipolar disorder (HCC)    CHF (congestive heart failure) (HCC)    CKD (chronic kidney disease) stage 4, GFR 15-29 ml/min (HCC)    M_W_F dialysis   COPD (chronic obstructive pulmonary disease) (HCC)    Depression    Essential hypertension    GERD (gastroesophageal reflux disease)    History of blood transfusion 2011   History of seizure 2017   only one seizure - none since   History of stroke 09/2014   Acute lacunar infarct of Samantha left thalamus   Leg cramps    Mild CAD    a. LHC 05/2015: 30% prox Cx, otherwise widely patent.   Morbid obesity (HCC)    Neuromuscular disorder (HCC)    neuropathy in feet   Neuropathy    feet   PTSD (post-traumatic stress disorder)    Retinal detachment    legally blind in right eye   Stroke (HCC)    x 2, right side weakness   Type 2 diabetes mellitus (HCC)     Past Surgical History:  Procedure Laterality Date   AV FISTULA PLACEMENT Right 01/08/2017   Procedure: RIGHT ARM BRACHIOCEPHALIC ARTERIOVENOUS (AV) FISTULA CREATION;  Surgeon: Adine Hoof, MD;  Location: [facility] OR;  Service: Vascular;  Laterality: Right;   AV FISTULA PLACEMENT Left 11/20/2018   Procedure: Creation of LEFT ARM ARTERIOVENOUS (AV) FISTULA;  Surgeon: Adine Hoof, MD;  Location: [facility] OR;  Service: Vascular;  Laterality: Left;   AV  FISTULA PLACEMENT Right 02/24/2021   Procedure: RIGHT UPPER ARM BASILIC VEIN ARTERIOVENOUS (AV) FISTULA CREATION;  Surgeon: Carlene Che, MD;  Location: [facility] OR;  Service: Vascular;  Laterality: Right;   AV FISTULA PLACEMENT Right 05/05/2021   Procedure: INSERTION OF ARTERIOVENOUS (AV) GORE-TEX GRAFT ARM;  Surgeon: Carlene Che, MD;  Location: [facility] OR;  Service: Vascular;  Laterality: Right;   BASCILIC VEIN TRANSPOSITION Left 02/11/2019   Procedure: BASILIC VEIN TRANSPOSITION SECOND  STAGE;  Surgeon: Adine Hoof, MD;  Location: [facility] OR;  Service: Vascular;  Laterality: Left;   CARDIAC CATHETERIZATION N/A 06/01/2015   Procedure: Left Heart Cath and Coronary Angiography;  Surgeon: Arty Binning, MD; CFX 30%, no other dz, EF nl by echo   CESAREAN SECTION     x2   COLONOSCOPY WITH PROPOFOL  N/A 07/27/2022   Procedure: COLONOSCOPY WITH PROPOFOL ;  Surgeon: Vinetta Greening, DO;  [facility]: [facility];  Service: Endoscopy;  Laterality: N/A;  930am, asa 3, dialysis pt   DILATION AND CURETTAGE OF UTERUS     EYE SURGERY     Right eye pars plano vitrectomy    HEMATOMA EVACUATION Left 03/03/2019   Procedure: EVACUATION HEMATOMA;  Surgeon: Richrd Char, MD;  Location: [facility] OR;  Service: Vascular;  Laterality: Left;   INSERTION OF DIALYSIS CATHETER Right 01/04/2021   Procedure: INSERTION OF RIGHT INTERNAL JUGULAR TUNNELED DIALYSIS CATHETER;  Surgeon: Richrd Char, MD;  Location: [facility] OR;  Service: Vascular;  Laterality: Right;   LASIK     LIGATION OF ARTERIOVENOUS  FISTULA Left 01/04/2021   Procedure: LIGATION OF LEFT ARM ARTERIOVENOUS  FISTULA;  Surgeon: Richrd Char, MD;  Location: [facility] OR;  Service: Vascular;  Laterality: Left;   PARS PLANA VITRECTOMY  04/20/2011   Procedure: PARS PLANA VITRECTOMY WITH 25 GAUGE;  Surgeon: Rexene Catching, MD;  Location: [facility] OR;  Service: Ophthalmology;  Laterality: Left;  membrane peel, gas fluid exchange, endolaser, repair of complex retinal detachment   PATCH ANGIOPLASTY Left 10/05/2020   Procedure: PATCH ANGIOPLASTY USING Corinna Dickens BIOLOGIC PATCH;  Surgeon: Richrd Char, MD;  Location: [facility] OR;  Service: Vascular;  Laterality: Left;   PERIPHERAL VASCULAR BALLOON ANGIOPLASTY Left 09/02/2020   Procedure: PERIPHERAL VASCULAR BALLOON ANGIOPLASTY;  Surgeon: Young Hensen, MD;  [facility]: [facility] INV[facility];  Service: Cardiovascular;  Laterality: Left;  arm fistula   REMOVAL OF A DIALYSIS CATHETER N/A 06/21/2021   Procedure: MINOR  REMOVAL OF TUNNELED DIALYSIS CATHETER;  Surgeon: Mayo Speck, MD;  Location: [facility] ORS;  Service: Vascular;  Laterality: N/A;   REVISON OF ARTERIOVENOUS FISTULA Left 10/05/2020   Procedure: REVISION OF LEFT ARM ARTERIOVENOUS FISTULA;  Surgeon: Richrd Char, MD;  Location: [facility] OR;  Service: Vascular;  Laterality: Left;   ROBOTIC ASSISTED LAPAROSCOPIC VENTRAL/INCISIONAL HERNIA REPAIR N/A 08/23/2023   Procedure: OPEN PRIMARY REPAIR INCISIONAL HERNIA;  Surgeon: Aldean Hummingbird, MD;  Location: [facility] ORS;  Service: General;  Laterality: N/A;  with Mesh   ROBOTIC ASSISTED TOTAL HYSTERECTOMY WITH BILATERAL SALPINGO OOPHERECTOMY Bilateral 08/23/2023   Procedure: HYSTERECTOMY, TOTAL, ROBOT-ASSISTED, LAPAROSCOPIC, WITH BILATERAL SALPINGO-OOPHORECTOMY, LYSIS OF ADHESIONS;  Surgeon: Suzi Essex, MD;  Location: [facility] ORS;  Service: Gynecology;  Laterality: Bilateral;   TUBAL LIGATION      Family History  Problem Relation Age of Onset   Diabetes Mother    Kidney failure Mother    Diabetes Father    Amblyopia Neg Hx    Blindness Neg Hx  Cataracts Neg Hx    Glaucoma Neg Hx    Macular degeneration Neg Hx    Retinal detachment Neg Hx    Strabismus Neg Hx    Retinitis pigmentosa Neg Hx     Social History   Socioeconomic History   Marital status: Single    Spouse name: Not on file   Number of children: Not on file   Years of education: Not on file   Highest education level: Not on file  Occupational History   Not on file  Tobacco Use   Smoking status: Every Day    Current packs/day: 0.00    Average packs/day: (1.2 ttl pk-yrs)    Types: Cigarettes    Start date: 06/05/1981    Last attempt to quit: 06/2022    Years since quitting: 1.2   Smokeless tobacco: Never   Tobacco comments:    Quit in 12/2018 but now smokes off and on per Pierce 05/04/21.  Vaping Use   Vaping status: Never Used  Substance and Sexual Activity   Alcohol  use: Yes    Comment: occ wine   Drug use: No   Sexual activity:  Yes    Birth control/protection: Surgical    Comment: Tubal Ligation  Other Topics Concern   Not on file  Social History Narrative   Not on file   Social Drivers of Health   Financial Resource Strain: Not on file  Food Insecurity: No Food Insecurity (08/24/2023)   Hunger Vital Sign    Worried About Running Out of Food in [place] Last Year: Never true    Ran Out of Food in [place] Last Year: Never true  Recent Concern: Food Insecurity - Food Insecurity Present (07/24/2023)   Hunger Vital Sign    Worried About Running Out of Food in [place] Last Year: Sometimes true    Ran Out of Food in [place] Last Year: Sometimes true  Transportation Needs: No Transportation Needs (08/24/2023)   PRAPARE - Administrator, Civil Service (Medical): No    Lack of Transportation (Non-Medical): No  Physical Activity: Not on file  Stress: Not on file  Social Connections: Not on file    Current Medications:  Current Outpatient Medications:    amLODipine  (NORVASC ) 10 MG tablet, Take 1 tablet (10 mg total) by mouth daily., Disp: 30 tablet, Rfl: 1   atorvastatin  (LIPITOR ) 80 MG tablet, Take 1 tablet (80 mg total) by mouth daily at 6 PM. (Pierce taking differently: Take 80 mg by mouth at bedtime.), Disp: 30 tablet, Rfl: 6   cloNIDine  (CATAPRES ) 0.2 MG tablet, Take 0.2 mg by mouth 2 (two) times daily., Disp: , Rfl:    Continuous Blood Gluc Receiver (DEXCOM G6 RECEIVER) DEVI, USE TO CHECK GLUCOSE FOUR TIMES DAILY AS DIRECTED., Disp: 1 each, Rfl: 0   diclofenac  Sodium (VOLTAREN ) 1 % GEL, One pump tid prn, Disp: 100 g, Rfl: 11   FLUoxetine  (PROZAC ) 40 MG capsule, Take 40 mg by mouth daily., Disp: , Rfl:    furosemide  (LASIX ) 80 MG tablet, Take 80 mg by mouth 2 (two) times daily., Disp: , Rfl:    gabapentin  (NEURONTIN ) 300 MG capsule, TAKE (1) CAPSULE BY MOUTH DAILY. (Pierce taking differently: Take 900 mg by mouth 2 (two) times daily.), Disp: 30 capsule, Rfl: 0   hydrALAZINE  (APRESOLINE ) 50 MG tablet, Take 50 mg by  mouth in Samantha morning and at bedtime., Disp: , Rfl:    hydrOXYzine (ATARAX) 10 MG tablet, Take 10 mg by mouth  3 (three) times daily as needed for anxiety., Disp: , Rfl:    insulin  degludec (TRESIBA  FLEXTOUCH) 100 UNIT/ML FlexTouch Pen, Inject 30 Units into Samantha skin at bedtime., Disp: 30 mL, Rfl: 3   insulin  lispro (HUMALOG  KWIKPEN) 100 UNIT/ML KwikPen, Inject 4-10 Units into Samantha skin 3 (three) times daily., Disp: 30 mL, Rfl: 3   lactulose (CHRONULAC) 10 GM/15ML solution, Take 10 g by mouth daily., Disp: , Rfl:    Lancets (ONETOUCH DELICA PLUS LANCET33G) MISC, Apply topically., Disp: , Rfl:    lidocaine -prilocaine  (EMLA ) cream, Apply 1 Application topically as needed. One pump tid prn, Disp: 30 g, Rfl: 11   lubiprostone  (AMITIZA ) 24 MCG capsule, Take 1 capsule (24 mcg total) by mouth 2 (two) times daily with a meal., Disp: 60 capsule, Rfl: 3   omeprazole  (PRILOSEC  OTC) 20 MG tablet, Take 20 mg by mouth daily before breakfast., Disp: , Rfl:    ONETOUCH VERIO test strip, SMARTSIG:Via Meter (Pierce not taking: Reported on 08/09/2023), Disp: , Rfl:    polyethylene glycol (MIRALAX ) 17 g packet, Take 17 g by mouth daily. (Pierce not taking: Reported on 08/13/2023), Disp: 14 each, Rfl: 0   Semaglutide , 2 MG/DOSE, (OZEMPIC , 2 MG/DOSE,) 8 MG/3ML SOPN, Inject 2 mg into Samantha skin once a week., Disp: 9 mL, Rfl: 1   traMADol  (ULTRAM ) 50 MG tablet, Take 1 tablet (50 mg total) by mouth every 12 (twelve) hours as needed for moderate pain (pain score 4-6) (mild to moderate pain). For AFTER surgery only, do not take and drive (Pierce not taking: Reported on 09/17/2023), Disp: 10 tablet, Rfl: 0   traZODone  (DESYREL ) 100 MG tablet, Take 100 mg by mouth at bedtime., Disp: , Rfl:   Review of Systems: Pertinent positives as per HPI Denies appetite changes, fevers, chills, fatigue, unexplained weight changes. Denies hearing loss, neck lumps or masses, mouth sores, ringing in ears or voice changes. Denies cough or wheezing.   Denies shortness of breath. Denies chest pain or palpitations. Denies leg swelling. Denies abdominal distention, blood in stools, diarrhea, or early satiety. Denies pain with intercourse, dysuria, frequency, hematuria or incontinence. Denies hot flashes, pelvic pain, vaginal bleeding or vaginal discharge.   Denies joint pain, back pain or muscle pain/cramps. Denies itching, rash, or wounds. Denies dizziness, headaches, numbness or seizures. Denies swollen lymph nodes or glands, denies easy bruising or bleeding. Denies anxiety, depression, confusion, or decreased concentration.  Physical Exam: BP 115/61 (BP Location: Left Arm, Pierce Position: Sitting)   Pulse 90   Temp 97.9 F (36.6 C) (Oral)   Resp 17   Ht 5\' 4"  (1.626 m)   Wt 218 lb (98.9 kg)   LMP  (LMP Unknown)   SpO2 100%   BMI 37.42 kg/m  General: Alert, oriented, no acute distress. HEENT: Posterior oropharynx clear, sclera anicteric. Chest: Unlabored breathing on room air. Abdomen: Obese, soft, nontender.  Normoactive bowel sounds.  No masses or hepatosplenomegaly appreciated.  Well-healed incisions. Extremities: Grossly normal range of motion.  Warm, well perfused.  No edema bilaterally. GU: Normal appearing external genitalia without erythema, excoriation, or lesions.  Speculum exam reveals cuff intact, suture visible.  Bimanual exam reveals cuff intact, no fluctuance or tenderness to palpation.    Laboratory & Radiologic Studies: A. ENDOMETRIAL, BIOPSY:      Benign inactive endometrium.      Benign endocervical glands and cervical squamous epithelium.      Negative for glandular hyperplasia, squamous intraepithelial lesion and malignancy.  B. LEFT TUBE AND OVARY:  Fibroma of ovary (1.5 cm).      Background ovarian parenchyma with inclusion cysts.      Benign fimbriated fallopian tube with paratubal cysts.      Negative for malignancy.  C. UTERUS, CERVIX, RIGHT TUBE AND OVARY: Cervix:            Unremarkable.           Negative for dysplasia or malignancy.       Endocervix:           Nabothian cysts.           Negative for hyperplasia, atypia or malignancy.       Endometrium:           Benign inactive endometrium.           Negative for hyperplasia, atypia or malignancy.       Myometrium:           Leiomyomata.           Negative for malignancy.       Serosa:           Unremarkable.           Negative for malignancy.       Right fallopian tube:           Benign fimbriated fallopian tubes.           Negative for malignancy.       Right ovary:           Unremarkable.           Negative for malignancy.   Assessment & Plan: Samantha Pierce is a 53 y.o. woman with complex adnexal, AUB, hernia at site of prior c-section now s/p extensive LOA, TRH/BSO, repair of incisional hernia.   Samantha Pierce is overall doing well from a postoperative standpoint, discussed continued expectations and restrictions.  She continues to struggle with nausea, almost exclusively at dialysis.  She is tolerating a normal diet, having improved bowel function compared to prior to surgery.  It seems less likely that this is related to complication from recent surgery.  I have asked my office to call her next week.  If nausea has not improved, we will plan to get a CT scan with oral contrast, no IV contrast given kidney dysfunction/dialysis.  Blood sugars have been better controlled this week.  Even with blood sugars in Samantha 200s last week, I would not expect this to cause nausea.  I stressed Samantha importance of continued close monitoring and keeping blood glucose under 180.  20 minutes of total time was spent for this Pierce encounter, including preparation, face-to-face counseling with Samantha Pierce and coordination of care, and documentation of Samantha encounter.  Wiley Hanger, MD  Division of Gynecologic Oncology  Department of Obstetrics and Gynecology  [facility]

## 2023-09-27 ENCOUNTER — Other Ambulatory Visit: Payer: Self-pay | Admitting: Gynecologic Oncology

## 2023-09-27 ENCOUNTER — Telehealth: Payer: Self-pay | Admitting: *Deleted

## 2023-09-27 ENCOUNTER — Telehealth: Payer: Self-pay

## 2023-09-27 DIAGNOSIS — R1084 Generalized abdominal pain: Secondary | ICD-10-CM

## 2023-09-27 NOTE — Telephone Encounter (Signed)
 Per Dr.Tucker's request, I called Ms.Mcgeachy to follow up from appointment on 4/18, pt was having nausea.   Pt states she is still having the nausea at dialysis (takes Zofran  with relief) but has also noticed it in the AM at home. Reports having constipation,and saw her pcp today, they told her she has IBS, she was told they are looking into getting her scheduled for a colonoscopy (recent failed last one).   Pt is aware message will be sent to Dr.Tucker with advice on if a CT scan needs to be scheduled. (Per last office note). She voiced an understanding.

## 2023-09-27 NOTE — Telephone Encounter (Signed)
 Spoke with Samantha Pierce and relayed message from Vira Grieves, NP that Dr. Orvil Bland ordered a CT abdomen and pelvis and it has been scheduled for Tuesday, May 6th at [facility]. Pt is to arrive at 12:15 for a 2:30 pm appt. To drink oral contrast (readi cat barium contrast) per CT this is safe with patient's allergy. Pt agreed to appt. Date and time and verbalized understanding and thanked the office for calling.

## 2023-09-27 NOTE — Telephone Encounter (Signed)
-----   Message from [facility] J sent at 09/27/2023  2:04 PM EDT -----  ----- Message ----- From: Vira Grieves D, NP Sent: 09/27/2023   1:20 PM EDT To: Louella Rout, RN; Ricardo Chamber Wheat, NT; #  Dr. Orvil Bland ordered a CT [facility] for her. She really wants her to have oral contrast since she is having nausea. No IV contrast given kidneys/dialysis.   Please reach out to CT and see if she can have oral contrast given her allergy.   Please let me know. Thanks

## 2023-10-01 ENCOUNTER — Telehealth: Payer: Self-pay | Admitting: *Deleted

## 2023-10-01 NOTE — Telephone Encounter (Signed)
 As long as the patient is tolerating PO without nausea/emesis and she is having bowel function, I'm ok waiting on the scan if she is starting to feel better. If symptoms don't continue to improve, I would recommend a scan. Thank you

## 2023-10-01 NOTE — Telephone Encounter (Signed)
 Spoke with Samantha Pierce who called the office to cancel her CT scan on May 6th. Pt states she wasn't as nauseous yesterday and today she hasn't felt nauseous. Pt states, "I am going to just deal with it, and cancel my CT scan".  Advised patient that her message would be relayed to provider.

## 2023-10-02 ENCOUNTER — Encounter: Payer: Self-pay | Admitting: *Deleted

## 2023-10-02 ENCOUNTER — Other Ambulatory Visit: Payer: Self-pay | Admitting: *Deleted

## 2023-10-02 MED ORDER — PEG 3350-KCL-NA BICARB-NACL 420 G PO SOLR: 4000.0000 mL | Freq: Once | ORAL | 0 refills | Status: AC

## 2023-10-02 NOTE — Telephone Encounter (Signed)
 Spoke with Samantha Pierce and relayed message from Dr. Orvil Bland -As long as the patient is tolerating PO without nausea/emesis and she is having bowel function, It's ok waiting on the scan if pt is starting to feel better. If symptoms don't continue to improve, I would recommend a scan. Pt verbalized understanding and states she started back on Linzess  with her PCP and her bowels are moving, she states that her PCP thought she maybe nauseous because she wasn't moving her bowels and was feeling constipated. She is following up with her Gastroenterologist tomorrow as well and needs a follow up colonoscopy. Advised patient her message would be relayed to provider and pt is aware to call the office with any concerns or needs. Pt thanked the office for calling.

## 2023-10-02 NOTE — Telephone Encounter (Signed)
 Pt has been scheduled for 10/30/23. Instructions mailed and prep sent to the pharmacy.

## 2023-10-09 ENCOUNTER — Ambulatory Visit (HOSPITAL_COMMUNITY)

## 2023-10-25 ENCOUNTER — Encounter (HOSPITAL_COMMUNITY)
Admission: RE | Admit: 2023-10-25 | Discharge: 2023-10-25 | Disposition: A | Discharge status: Home / Self Care | Source: Ambulatory Visit | Attending: Internal Medicine | Admitting: Internal Medicine

## 2023-10-30 ENCOUNTER — Ambulatory Visit (HOSPITAL_COMMUNITY): Admitting: Anesthesiology

## 2023-10-30 ENCOUNTER — Encounter (HOSPITAL_COMMUNITY)
Admission: RE | Disposition: A | Discharge status: Home / Self Care | Payer: Self-pay | Source: Home / Self Care | Attending: Internal Medicine

## 2023-10-30 ENCOUNTER — Other Ambulatory Visit: Payer: Self-pay

## 2023-10-30 ENCOUNTER — Encounter (HOSPITAL_COMMUNITY): Payer: Self-pay | Admitting: Internal Medicine

## 2023-10-30 ENCOUNTER — Ambulatory Visit (HOSPITAL_COMMUNITY)
Admission: RE | Admit: 2023-10-30 | Discharge: 2023-10-30 | Disposition: A | Discharge status: Home / Self Care | Attending: Internal Medicine | Admitting: Internal Medicine

## 2023-10-30 DIAGNOSIS — K648 Other hemorrhoids: Secondary | ICD-10-CM | POA: Insufficient documentation

## 2023-10-30 DIAGNOSIS — E6689 Other obesity not elsewhere classified: Secondary | ICD-10-CM | POA: Insufficient documentation

## 2023-10-30 DIAGNOSIS — K649 Unspecified hemorrhoids: Secondary | ICD-10-CM | POA: Diagnosis not present

## 2023-10-30 DIAGNOSIS — J449 Chronic obstructive pulmonary disease, unspecified: Secondary | ICD-10-CM | POA: Diagnosis not present

## 2023-10-30 DIAGNOSIS — Z1211 Encounter for screening for malignant neoplasm of colon: Secondary | ICD-10-CM

## 2023-10-30 DIAGNOSIS — Z6837 Body mass index (BMI) 37.0-37.9, adult: Secondary | ICD-10-CM | POA: Insufficient documentation

## 2023-10-30 DIAGNOSIS — F17210 Nicotine dependence, cigarettes, uncomplicated: Secondary | ICD-10-CM | POA: Diagnosis not present

## 2023-10-30 DIAGNOSIS — Q438 Other specified congenital malformations of intestine: Secondary | ICD-10-CM | POA: Insufficient documentation

## 2023-10-30 DIAGNOSIS — I130 Hypertensive heart and chronic kidney disease with heart failure and stage 1 through stage 4 chronic kidney disease, or unspecified chronic kidney disease: Secondary | ICD-10-CM | POA: Diagnosis not present

## 2023-10-30 DIAGNOSIS — E1122 Type 2 diabetes mellitus with diabetic chronic kidney disease: Secondary | ICD-10-CM | POA: Insufficient documentation

## 2023-10-30 DIAGNOSIS — K644 Residual hemorrhoidal skin tags: Secondary | ICD-10-CM | POA: Insufficient documentation

## 2023-10-30 DIAGNOSIS — I2510 Atherosclerotic heart disease of native coronary artery without angina pectoris: Secondary | ICD-10-CM

## 2023-10-30 DIAGNOSIS — Z794 Long term (current) use of insulin: Secondary | ICD-10-CM | POA: Insufficient documentation

## 2023-10-30 DIAGNOSIS — Z833 Family history of diabetes mellitus: Secondary | ICD-10-CM | POA: Insufficient documentation

## 2023-10-30 DIAGNOSIS — K219 Gastro-esophageal reflux disease without esophagitis: Secondary | ICD-10-CM | POA: Diagnosis not present

## 2023-10-30 DIAGNOSIS — E119 Type 2 diabetes mellitus without complications: Secondary | ICD-10-CM | POA: Insufficient documentation

## 2023-10-30 DIAGNOSIS — F419 Anxiety disorder, unspecified: Secondary | ICD-10-CM | POA: Diagnosis not present

## 2023-10-30 DIAGNOSIS — I509 Heart failure, unspecified: Secondary | ICD-10-CM | POA: Insufficient documentation

## 2023-10-30 DIAGNOSIS — I110 Hypertensive heart disease with heart failure: Secondary | ICD-10-CM | POA: Diagnosis not present

## 2023-10-30 DIAGNOSIS — N184 Chronic kidney disease, stage 4 (severe): Secondary | ICD-10-CM | POA: Diagnosis not present

## 2023-10-30 DIAGNOSIS — R0602 Shortness of breath: Secondary | ICD-10-CM | POA: Diagnosis not present

## 2023-10-30 DIAGNOSIS — D649 Anemia, unspecified: Secondary | ICD-10-CM | POA: Insufficient documentation

## 2023-10-30 DIAGNOSIS — F319 Bipolar disorder, unspecified: Secondary | ICD-10-CM | POA: Diagnosis not present

## 2023-10-30 DIAGNOSIS — Z8673 Personal history of transient ischemic attack (TIA), and cerebral infarction without residual deficits: Secondary | ICD-10-CM | POA: Insufficient documentation

## 2023-10-30 DIAGNOSIS — Z992 Dependence on renal dialysis: Secondary | ICD-10-CM | POA: Diagnosis not present

## 2023-10-30 SURGERY — COLONOSCOPY
Anesthesia: General

## 2023-10-30 MED ORDER — PROPOFOL 10 MG/ML IV BOLUS
INTRAVENOUS | Status: DC | PRN
  Administered 2023-10-30: 100 mg via INTRAVENOUS

## 2023-10-30 MED ORDER — PHENYLEPHRINE 80 MCG/ML (10ML) SYRINGE FOR IV PUSH (FOR BLOOD PRESSURE SUPPORT)
PREFILLED_SYRINGE | INTRAVENOUS | Status: DC | PRN
Start: 2023-10-30 — End: 2023-10-30
  Administered 2023-10-30: 160 ug via INTRAVENOUS

## 2023-10-30 MED ORDER — LACTATED RINGERS IV SOLN: INTRAVENOUS | Status: DC

## 2023-10-30 MED ORDER — PROPOFOL 500 MG/50ML IV EMUL
INTRAVENOUS | Status: DC | PRN
  Administered 2023-10-30: 200 ug/kg/min via INTRAVENOUS

## 2023-10-30 NOTE — Op Note (Signed)
 [facility] Patient Name: Samantha Pierce Procedure Date: 10/30/2023 9:27 AM MRN: [id number] Date of Birth: 1970/08/18 Attending MD: Rolando Cliche. Mordechai April , [place], [id number] CSN: [id number] Age: 53 Admit Type: Outpatient Procedure:                Colonoscopy Indications:              Screening for colorectal malignant neoplasm,                            inadequate bowel prep on last colonoscopy (more                            recent than 10 years ago) Providers:                Rolando Cliche. Mordechai April, DO, Troy Furnish. Museum/gallery exhibitions officer, Charity fundraiser,                            Wilfredo Hanly. Roberta Chin, Technician Referring MD:              Medicines:                See the Anesthesia note for documentation of the                            administered medications Complications:            No immediate complications. Estimated Blood Loss:     Estimated blood loss: none. Procedure:                Pre-Anesthesia Assessment:                           - The anesthesia plan was to use monitored                            anesthesia care (MAC).                           After obtaining informed consent, the colonoscope                            was passed under direct vision. Throughout the                            procedure, the patient's blood pressure, pulse, and                            oxygen  saturations were monitored continuously. The                            [id number]-HQ190L ([id number]) scope was introduced through                            the anus and advanced to the the cecum, identified                            by appendiceal orifice  and ileocecal valve. The                            colonoscopy was somewhat difficult due to a                            redundant colon and significant looping. Successful                            completion of the procedure was aided by applying                            abdominal pressure. The patient tolerated the                            procedure well. The  quality of the bowel                            preparation was evaluated using the BBPS [facility]                            Bowel Preparation Scale) with scores of: Right                            Colon = 2 (minor amount of residual staining, small                            fragments of stool and/or opaque liquid, but mucosa                            seen well), Transverse Colon = 2 (minor amount of                            residual staining, small fragments of stool and/or                            opaque liquid, but mucosa seen well) and Left Colon                            = 2 (minor amount of residual staining, small                            fragments of stool and/or opaque liquid, but mucosa                            seen well). The total BBPS score equals 6. The                            quality of the bowel preparation was good. Scope In: 9:56:32 AM Scope Out: 10:15:35 AM Scope Withdrawal Time: 0 hours 11 minutes 13 seconds  Total Procedure Duration: 0 hours 19 minutes 3 seconds  Findings:      Hemorrhoids were found on perianal exam.  Non-bleeding internal hemorrhoids were found.      The colon (entire examined portion) was moderately redundant. Advancing       the scope required applying abdominal pressure.      The exam was otherwise without abnormality. Impression:               - Hemorrhoids found on perianal exam.                           - Non-bleeding internal hemorrhoids.                           - Redundant colon.                           - The examination was otherwise normal.                           - No specimens collected. Moderate Sedation:      Per Anesthesia Care Recommendation:           - Patient has a contact number available for                            emergencies. The signs and symptoms of potential                            delayed complications were discussed with the                            patient. Return to normal activities  tomorrow.                            Written discharge instructions were provided to the                            patient.                           - Resume previous diet.                           - Continue present medications.                           - Repeat colonoscopy in 10 years for screening                            purposes.                           - Return to GI clinic in 8 weeks. Procedure Code(s):        --- Professional ---                           Z6109, Colorectal cancer screening; colonoscopy on  individual not meeting criteria for high risk Diagnosis Code(s):        --- Professional ---                           Z12.11, Encounter for screening for malignant                            neoplasm of colon                           K64.8, Other hemorrhoids                           Q43.8, Other specified congenital malformations of                            intestine CPT copyright 2022 American Medical Association. All rights reserved. The codes documented in this report are preliminary and upon coder review may  be revised to meet current compliance requirements. Rolando Cliche. Mordechai April, DO Rolando Cliche. Laurianne Floresca, DO 10/30/2023 10:21:18 AM This report has been signed electronically. Number of Addenda: 0

## 2023-10-30 NOTE — H&P (Signed)
 Primary Care Physician:  Carolyn Cisco, NP Primary Gastroenterologist:  Dr. Mordechai April  Pre-Procedure History & Physical: HPI:  Samantha Pierce is a 53 y.o. female is here for a colonoscopy for colon cancer screening purposes.    Past Medical History:  Diagnosis Date   Anemia    Anxiety    Asystole (HCC)    a. 05/2015: pt developed bradycardia->asystole during Lexiscan  nuclear stress test, s/p brief code. Cath with only 30% prox LCx. Her asystole during Lexiscan  infusion was felt to represent an excessive pharmacologic response to the agent and likely superimposed vasovagal response.   Bipolar disorder (HCC)    CHF (congestive heart failure) (HCC)    CKD (chronic kidney disease) stage 4, GFR 15-29 ml/min (HCC)    M_W_F dialysis   COPD (chronic obstructive pulmonary disease) (HCC)    Depression    Essential hypertension    GERD (gastroesophageal reflux disease)    History of blood transfusion 2011   History of seizure 2017   only one seizure - none since   History of stroke 09/2014   Acute lacunar infarct of the left thalamus   Leg cramps    Mild CAD    a. LHC 05/2015: 30% prox Cx, otherwise widely patent.   Morbid obesity (HCC)    Neuromuscular disorder (HCC)    neuropathy in feet   Neuropathy    feet   PTSD (post-traumatic stress disorder)    Retinal detachment    legally blind in right eye   Stroke (HCC)    x 2, right side weakness   Type 2 diabetes mellitus (HCC)     Past Surgical History:  Procedure Laterality Date   AV FISTULA PLACEMENT Right 01/08/2017   Procedure: RIGHT ARM BRACHIOCEPHALIC ARTERIOVENOUS (AV) FISTULA CREATION;  Surgeon: Adine Hoof, MD;  Location: [facility] OR;  Service: Vascular;  Laterality: Right;   AV FISTULA PLACEMENT Left 11/20/2018   Procedure: Creation of LEFT ARM ARTERIOVENOUS (AV) FISTULA;  Surgeon: Adine Hoof, MD;  Location: [facility] OR;  Service: Vascular;  Laterality: Left;   AV FISTULA PLACEMENT Right 02/24/2021    Procedure: RIGHT UPPER ARM BASILIC VEIN ARTERIOVENOUS (AV) FISTULA CREATION;  Surgeon: Carlene Che, MD;  Location: [facility] OR;  Service: Vascular;  Laterality: Right;   AV FISTULA PLACEMENT Right 05/05/2021   Procedure: INSERTION OF ARTERIOVENOUS (AV) GORE-TEX GRAFT ARM;  Surgeon: Carlene Che, MD;  Location: [facility] OR;  Service: Vascular;  Laterality: Right;   BASCILIC VEIN TRANSPOSITION Left 02/11/2019   Procedure: BASILIC VEIN TRANSPOSITION SECOND STAGE;  Surgeon: Adine Hoof, MD;  Location: [facility] OR;  Service: Vascular;  Laterality: Left;   CARDIAC CATHETERIZATION N/A 06/01/2015   Procedure: Left Heart Cath and Coronary Angiography;  Surgeon: Arty Binning, MD; CFX 30%, no other dz, EF nl by echo   CESAREAN SECTION     x2   COLONOSCOPY WITH PROPOFOL  N/A 07/27/2022   Procedure: COLONOSCOPY WITH PROPOFOL ;  Surgeon: Vinetta Greening, DO;  Location: [facility] SUITE;  Service: Endoscopy;  Laterality: N/A;  930am, asa 3, dialysis pt   DILATION AND CURETTAGE OF UTERUS     EYE SURGERY     Right eye pars plano vitrectomy    HEMATOMA EVACUATION Left 03/03/2019   Procedure: EVACUATION HEMATOMA;  Surgeon: Richrd Char, MD;  Location: [facility] OR;  Service: Vascular;  Laterality: Left;   INSERTION OF DIALYSIS CATHETER Right 01/04/2021   Procedure: INSERTION OF RIGHT INTERNAL JUGULAR TUNNELED DIALYSIS CATHETER;  Surgeon:  Richrd Char, MD;  Location: [facility] OR;  Service: Vascular;  Laterality: Right;   LASIK     LIGATION OF ARTERIOVENOUS  FISTULA Left 01/04/2021   Procedure: LIGATION OF LEFT ARM ARTERIOVENOUS  FISTULA;  Surgeon: Richrd Char, MD;  Location: [facility] OR;  Service: Vascular;  Laterality: Left;   PARS PLANA VITRECTOMY  04/20/2011   Procedure: PARS PLANA VITRECTOMY WITH 25 GAUGE;  Surgeon: Rexene Catching, MD;  Location: [facility] OR;  Service: Ophthalmology;  Laterality: Left;  membrane peel, gas fluid exchange, endolaser, repair of complex retinal detachment   PATCH ANGIOPLASTY Left 10/05/2020    Procedure: PATCH ANGIOPLASTY USING Corinna Dickens BIOLOGIC PATCH;  Surgeon: Richrd Char, MD;  Location: [facility] OR;  Service: Vascular;  Laterality: Left;   PERIPHERAL VASCULAR BALLOON ANGIOPLASTY Left 09/02/2020   Procedure: PERIPHERAL VASCULAR BALLOON ANGIOPLASTY;  Surgeon: Young Hensen, MD;  Location: [facility] INVASIVE CV LAB;  Service: Cardiovascular;  Laterality: Left;  arm fistula   REMOVAL OF A DIALYSIS CATHETER N/A 06/21/2021   Procedure: MINOR REMOVAL OF TUNNELED DIALYSIS CATHETER;  Surgeon: Mayo Speck, MD;  Location: [facility] ORS;  Service: Vascular;  Laterality: N/A;   REVISON OF ARTERIOVENOUS FISTULA Left 10/05/2020   Procedure: REVISION OF LEFT ARM ARTERIOVENOUS FISTULA;  Surgeon: Richrd Char, MD;  Location: [facility] OR;  Service: Vascular;  Laterality: Left;   ROBOTIC ASSISTED LAPAROSCOPIC VENTRAL/INCISIONAL HERNIA REPAIR N/A 08/23/2023   Procedure: OPEN PRIMARY REPAIR INCISIONAL HERNIA;  Surgeon: Aldean Hummingbird, MD;  Location: [facility] ORS;  Service: General;  Laterality: N/A;  with Mesh   ROBOTIC ASSISTED TOTAL HYSTERECTOMY WITH BILATERAL SALPINGO OOPHERECTOMY Bilateral 08/23/2023   Procedure: HYSTERECTOMY, TOTAL, ROBOT-ASSISTED, LAPAROSCOPIC, WITH BILATERAL SALPINGO-OOPHORECTOMY, LYSIS OF ADHESIONS;  Surgeon: Suzi Essex, MD;  Location: [facility] ORS;  Service: Gynecology;  Laterality: Bilateral;   TUBAL LIGATION      Prior to Admission medications   Medication Sig Start Date End Date Taking? Authorizing Provider  amLODipine  (NORVASC ) 10 MG tablet Take 1 tablet (10 mg total) by mouth daily. 01/21/19  Yes Tat, Myrtie Atkinson, MD  atorvastatin  (LIPITOR ) 80 MG tablet Take 1 tablet (80 mg total) by mouth daily at 6 PM. Patient taking differently: Take 80 mg by mouth at bedtime. 08/05/15  Yes Gerard Knight, MD  cloNIDine  (CATAPRES ) 0.2 MG tablet Take 0.2 mg by mouth 2 (two) times daily.   Yes [provider]  Continuous Blood Gluc Receiver (DEXCOM G6 RECEIVER) DEVI USE TO CHECK GLUCOSE FOUR TIMES  DAILY AS DIRECTED. 10/08/20  Yes Wendel Hals, NP  FLUoxetine  (PROZAC ) 40 MG capsule Take 40 mg by mouth daily.   Yes [provider]  furosemide  (LASIX ) 80 MG tablet Take 80 mg by mouth 2 (two) times daily.   Yes [provider]  hydrALAZINE  (APRESOLINE ) 50 MG tablet Take 50 mg by mouth in the morning and at bedtime.   Yes [provider]  hydrOXYzine (ATARAX) 10 MG tablet Take 10 mg by mouth 3 (three) times daily as needed for anxiety.   Yes [provider]  insulin  lispro (HUMALOG  KWIKPEN) 100 UNIT/ML KwikPen Inject 4-10 Units into the skin 3 (three) times daily. 08/13/23  Yes Wendel Hals, NP  lactulose (CHRONULAC) 10 GM/15ML solution Take 10 g by mouth daily. 06/22/23  Yes [provider]  Lancets (ONETOUCH DELICA PLUS LANCET33G) MISC Apply topically. 12/15/20  Yes [provider]  lubiprostone  (AMITIZA ) 24 MCG capsule Take 1 capsule (24 mcg total) by mouth 2 (two) times daily  with a meal. 08/29/23  Yes April Knack, Kristen S, PA-C  omeprazole  (PRILOSEC  OTC) 20 MG tablet Take 20 mg by mouth daily before breakfast.   Yes [provider]  Semaglutide , 2 MG/DOSE, (OZEMPIC , 2 MG/DOSE,) 8 MG/3ML SOPN Inject 2 mg into the skin once a week. 08/13/23  Yes Reardon, Arminda Landmark, NP  traZODone  (DESYREL ) 100 MG tablet Take 100 mg by mouth at bedtime.   Yes [provider]  diclofenac  Sodium (VOLTAREN ) 1 % GEL One pump tid prn 08/09/23   Phebe Brasil, MD  gabapentin  (NEURONTIN ) 300 MG capsule TAKE (1) CAPSULE BY MOUTH DAILY. Patient taking differently: Take 900 mg by mouth 2 (two) times daily. 07/04/22   Adine Hoof, MD  insulin  degludec (TRESIBA  FLEXTOUCH) 100 UNIT/ML FlexTouch Pen Inject 30 Units into the skin at bedtime. 08/13/23   Wendel Hals, NP  lidocaine -prilocaine  (EMLA ) cream Apply 1 Application topically as needed. One pump tid prn 08/09/23   Phebe Brasil, MD  [facility] VERIO test strip SMARTSIG:Via Meter Patient not  taking: Reported on 08/09/2023 12/15/20   [provider]  polyethylene glycol (MIRALAX ) 17 g packet Take 17 g by mouth daily. Patient not taking: Reported on 08/13/2023 06/25/23   Ninetta Basket, MD  traMADol  (ULTRAM ) 50 MG tablet Take 1 tablet (50 mg total) by mouth every 12 (twelve) hours as needed for moderate pain (pain score 4-6) (mild to moderate pain). For AFTER surgery only, do not take and drive Patient not taking: Reported on 09/17/2023 08/24/23   Vira Grieves D, NP    Allergies as of 10/02/2023 - Review Complete 09/21/2023  Allergen Reaction Noted   Contrast media [iodinated contrast media] Anaphylaxis 01/04/2017   Penicillins Rash and Other (See Comments) 04/18/2011    Family History  Problem Relation Age of Onset   Diabetes Mother    Kidney failure Mother    Diabetes Father    Amblyopia Neg Hx    Blindness Neg Hx    Cataracts Neg Hx    Glaucoma Neg Hx    Macular degeneration Neg Hx    Retinal detachment Neg Hx    Strabismus Neg Hx    Retinitis pigmentosa Neg Hx     Social History   Socioeconomic History   Marital status: Single    Spouse name: Not on file   Number of children: Not on file   Years of education: Not on file   Highest education level: Not on file  Occupational History   Not on file  Tobacco Use   Smoking status: Every Day    Current packs/day: 0.00    Average packs/day: (1.2 ttl pk-yrs)    Types: Cigarettes    Start date: 06/05/1981    Last attempt to quit: 06/2022    Years since quitting: 1.4   Smokeless tobacco: Never   Tobacco comments:    Quit in 12/2018 but now smokes off and on per patient 05/04/21.  Vaping Use   Vaping status: Never Used  Substance and Sexual Activity   Alcohol  use: Yes    Comment: occ wine   Drug use: No   Sexual activity: Yes    Birth control/protection: Surgical    Comment: Tubal Ligation  Other Topics Concern   Not on file  Social History Narrative   Not on file   Social Drivers of Health    Financial Resource Strain: Not on file  Food Insecurity: No Food Insecurity (08/24/2023)   Hunger Vital Sign    Worried  About Running Out of Food in the Last Year: Never true    Ran Out of Food in the Last Year: Never true  Recent Concern: Food Insecurity - Food Insecurity Present (07/24/2023)   Hunger Vital Sign    Worried About Running Out of Food in the Last Year: Sometimes true    Ran Out of Food in the Last Year: Sometimes true  Transportation Needs: No Transportation Needs (08/24/2023)   PRAPARE - Administrator, Civil Service (Medical): No    Lack of Transportation (Non-Medical): No  Physical Activity: Not on file  Stress: Not on file  Social Connections: Not on file  Intimate Partner Violence: Not At Risk (08/24/2023)   Humiliation, Afraid, Rape, and Kick questionnaire    Fear of Current or Ex-Partner: No    Emotionally Abused: No    Physically Abused: No    Sexually Abused: No    Review of Systems: See HPI, otherwise negative ROS  Physical Exam: Vital signs in last 24 hours: Temp:  [98.1 F (36.7 C)] 98.1 F (36.7 C) (05/27 0826) Pulse Rate:  [71] 71 (05/27 0826) Resp:  [18] 18 (05/27 0826) BP: (171)/(85) 171/85 (05/27 0826) SpO2:  [100 %] 100 % (05/27 0826)   General:   Alert,  Well-developed, well-nourished, pleasant and cooperative in NAD Head:  Normocephalic and atraumatic. Eyes:  Sclera clear, no icterus.   Conjunctiva pink. Ears:  Normal auditory acuity. Nose:  No deformity, discharge,  or lesions. Msk:  Symmetrical without gross deformities. Normal posture. Extremities:  Without clubbing or edema. Neurologic:  Alert and  oriented x4;  grossly normal neurologically. Skin:  Intact without significant lesions or rashes. Psych:  Alert and cooperative. Normal mood and affect.  Impression/Plan: Samantha Pierce is here for a colonoscopy to be performed for colon cancer screening purposes.  The risks of the procedure including infection, bleed,  or perforation as well as benefits, limitations, alternatives and imponderables have been reviewed with the patient. Questions have been answered. All parties agreeable.

## 2023-10-30 NOTE — Discharge Instructions (Signed)
  Colonoscopy Discharge Instructions  Read the instructions outlined below and refer to this sheet in the next few weeks. These discharge instructions provide you with general information on caring for yourself after you leave the hospital. Your doctor may also give you specific instructions. While your treatment has been planned according to the most current medical practices available, unavoidable complications occasionally occur.   ACTIVITY You may resume your regular activity, but move at a slower pace for the next 24 hours.  Take frequent rest periods for the next 24 hours.  Walking will help get rid of the air and reduce the bloated feeling in your belly (abdomen).  No driving for 24 hours (because of the medicine (anesthesia) used during the test).   Do not sign any important legal documents or operate any machinery for 24 hours (because of the anesthesia used during the test).  NUTRITION Drink plenty of fluids.  You may resume your normal diet as instructed by your doctor.  Begin with a light meal and progress to your normal diet. Heavy or fried foods are harder to digest and may make you feel sick to your stomach (nauseated).  Avoid alcoholic beverages for 24 hours or as instructed.  MEDICATIONS You may resume your normal medications unless your doctor tells you otherwise.  WHAT YOU CAN EXPECT TODAY Some feelings of bloating in the abdomen.  Passage of more gas than usual.  Spotting of blood in your stool or on the toilet paper.  IF YOU HAD POLYPS REMOVED DURING THE COLONOSCOPY: No aspirin  products for 7 days or as instructed.  No alcohol  for 7 days or as instructed.  Eat a soft diet for the next 24 hours.  FINDING OUT THE RESULTS OF YOUR TEST Not all test results are available during your visit. If your test results are not back during the visit, make an appointment with your caregiver to find out the results. Do not assume everything is normal if you have not heard from your  caregiver or the medical facility. It is important for you to follow up on all of your test results.  SEEK IMMEDIATE MEDICAL ATTENTION IF: You have more than a spotting of blood in your stool.  Your belly is swollen (abdominal distention).  You are nauseated or vomiting.  You have a temperature over 101.  You have abdominal pain or discomfort that is severe or gets worse throughout the day.   Your colonoscopy was relatively unremarkable.  I did not find any polyps or evidence of colon cancer.  I recommend repeating colonoscopy in 10 years for colon cancer screening purposes.    Follow-up with GI in 8 weeks.   I hope you have a great rest of your week!  Rolando Cliche. Mordechai April, D.O. Gastroenterology and Hepatology [facility]

## 2023-10-30 NOTE — Anesthesia Preprocedure Evaluation (Signed)
 Anesthesia Evaluation  Patient identified by MRN, date of birth, ID band Patient awake    Reviewed: Allergy & Precautions, H&P , NPO status , Patient's Chart, lab work & pertinent test results, reviewed documented beta blocker date and time   Airway Mallampati: II  TM Distance: >3 FB Neck ROM: full    Dental no notable dental hx.    Pulmonary shortness of breath, COPD, Current Smoker   Pulmonary exam normal breath sounds clear to auscultation       Cardiovascular Exercise Tolerance: Good hypertension, + CAD and +CHF   Rhythm:regular Rate:Normal     Neuro/Psych  PSYCHIATRIC DISORDERS Anxiety Depression Bipolar Disorder   TIA Neuromuscular disease CVA    GI/Hepatic Neg liver ROS,GERD  ,,  Endo/Other  diabetes  Class 4 obesity  Renal/GU Renal disease  negative genitourinary   Musculoskeletal   Abdominal   Peds  Hematology  (+) Blood dyscrasia, anemia   Anesthesia Other Findings   Reproductive/Obstetrics negative OB ROS                             Anesthesia Physical Anesthesia Plan  ASA: 3  Anesthesia Plan: General   Post-op Pain Management:    Induction:   PONV Risk Score and Plan: Propofol  infusion  Airway Management Planned:   Additional Equipment:   Intra-op Plan:   Post-operative Plan:   Informed Consent: I have reviewed the patients History and Physical, chart, labs and discussed the procedure including the risks, benefits and alternatives for the proposed anesthesia with the patient or authorized representative who has indicated his/her understanding and acceptance.     Dental Advisory Given  Plan Discussed with: CRNA  Anesthesia Plan Comments:        Anesthesia Quick Evaluation

## 2023-10-30 NOTE — Transfer of Care (Signed)
 Immediate Anesthesia Transfer of Care Note  Patient: Samantha Pierce  Procedure(s) Performed: COLONOSCOPY  Patient Location: PACU and Short Stay  Anesthesia Type:General  Level of Consciousness: sedated  Airway & Oxygen  Therapy: Patient Spontanous Breathing  Post-op Assessment: Report given to RN and Post -op Vital signs reviewed and stable  Post vital signs: Reviewed and stable  Last Vitals:  Vitals Value Taken Time  BP 149/82 10/30/23 1018  Temp 36.6 C 10/30/23 1018  Pulse 74 10/30/23 1018  Resp 24 10/30/23 1018  SpO2 100 % 10/30/23 1018    Last Pain:  Vitals:   10/30/23 1018  TempSrc: Axillary  PainSc: 0-No pain         Complications: No notable events documented.

## 2023-10-31 ENCOUNTER — Encounter (HOSPITAL_COMMUNITY): Payer: Self-pay | Admitting: Internal Medicine

## 2023-10-31 NOTE — Anesthesia Postprocedure Evaluation (Signed)
 Anesthesia Post Note  Patient: Samantha Pierce  Procedure(s) Performed: COLONOSCOPY  Patient location during evaluation: Phase II Anesthesia Type: General Level of consciousness: awake Pain management: pain level controlled Vital Signs Assessment: post-procedure vital signs reviewed and stable Respiratory status: spontaneous breathing and respiratory function stable Cardiovascular status: blood pressure returned to baseline and stable Postop Assessment: no headache and no apparent nausea or vomiting Anesthetic complications: no Comments: Late entry   No notable events documented.   Last Vitals:  Vitals:   10/30/23 0826 10/30/23 1018  BP: (!) 171/85 (!) 149/82  Pulse: 71 74  Resp: 18 (!) 24  Temp: 36.7 C 36.6 C  SpO2: 100% 100%    Last Pain:  Vitals:   10/30/23 1018  TempSrc: Axillary  PainSc: 0-No pain                 Coretha Dew

## 2023-11-05 ENCOUNTER — Telehealth: Payer: Self-pay

## 2023-11-05 LAB — POCT I-STAT, CHEM 8
BUN: 45 mg/dL — ABNORMAL HIGH (ref 6–20)
Calcium, Ion: 1.14 mmol/L — ABNORMAL LOW (ref 1.15–1.40)
Chloride: 100 mmol/L (ref 98–111)
Creatinine, Ser: 7 mg/dL — ABNORMAL HIGH (ref 0.44–1.00)
Glucose, Bld: 187 mg/dL — ABNORMAL HIGH (ref 70–99)
HCT: 42 % (ref 36.0–46.0)
Hemoglobin: 14.3 g/dL (ref 12.0–15.0)
Potassium: 5.4 mmol/L — ABNORMAL HIGH (ref 3.5–5.1)
Sodium: 133 mmol/L — ABNORMAL LOW (ref 135–145)
TCO2: 25 mmol/L (ref 22–32)

## 2023-11-05 NOTE — Telephone Encounter (Signed)
 Pt called wanting her colonoscopy results. Please advise

## 2023-11-07 ENCOUNTER — Telehealth: Payer: Self-pay

## 2023-11-07 NOTE — Telephone Encounter (Signed)
 error

## 2023-12-06 ENCOUNTER — Encounter: Payer: Self-pay | Admitting: Internal Medicine

## 2023-12-13 ENCOUNTER — Ambulatory Visit (INDEPENDENT_AMBULATORY_CARE_PROVIDER_SITE_OTHER): Admitting: Nurse Practitioner

## 2023-12-13 ENCOUNTER — Encounter: Payer: Self-pay | Admitting: Nurse Practitioner

## 2023-12-13 VITALS — BP 122/62 | HR 93 | Ht 64.0 in | Wt 213.2 lb

## 2023-12-13 DIAGNOSIS — E1165 Type 2 diabetes mellitus with hyperglycemia: Secondary | ICD-10-CM | POA: Diagnosis not present

## 2023-12-13 DIAGNOSIS — Z794 Long term (current) use of insulin: Secondary | ICD-10-CM | POA: Diagnosis not present

## 2023-12-13 DIAGNOSIS — Z7985 Long-term (current) use of injectable non-insulin antidiabetic drugs: Secondary | ICD-10-CM

## 2023-12-13 DIAGNOSIS — I10 Essential (primary) hypertension: Secondary | ICD-10-CM

## 2023-12-13 DIAGNOSIS — E782 Mixed hyperlipidemia: Secondary | ICD-10-CM

## 2023-12-13 LAB — POCT GLYCOSYLATED HEMOGLOBIN (HGB A1C): Hemoglobin A1C: 7.2 % — AB (ref 4.0–5.6)

## 2023-12-13 NOTE — Progress Notes (Signed)
 Endocrinology Follow Up Visit      12/13/2023, 12:07 PM   Subjective:    Patient ID: Samantha Pierce, female    DOB: 1971/03/25.  Samantha Pierce is being seen in follow up after being seen in consultation for management of currently uncontrolled symptomatic diabetes requested by  Delores Rojelio Caldron, NP.   Past Medical History:  Diagnosis Date   Anemia    Anxiety    Asystole (HCC)    a. 05/2015: pt developed bradycardia->asystole during Lexiscan  nuclear stress test, s/p brief code. Cath with only 30% prox LCx. Her asystole during Lexiscan  infusion was felt to represent an excessive pharmacologic response to the agent and likely superimposed vasovagal response.   Bipolar disorder (HCC)    CHF (congestive heart failure) (HCC)    CKD (chronic kidney disease) stage 4, GFR 15-29 ml/min (HCC)    M_W_F dialysis   COPD (chronic obstructive pulmonary disease) (HCC)    Depression    Essential hypertension    GERD (gastroesophageal reflux disease)    History of blood transfusion 2011   History of seizure 2017   only one seizure - none since   History of stroke 09/2014   Acute lacunar infarct of the left thalamus   Leg cramps    Mild CAD    a. LHC 05/2015: 30% prox Cx, otherwise widely patent.   Morbid obesity (HCC)    Neuromuscular disorder (HCC)    neuropathy in feet   Neuropathy    feet   PTSD (post-traumatic stress disorder)    Retinal detachment    legally blind in right eye   Stroke (HCC)    x 2, right side weakness   Type 2 diabetes mellitus (HCC)     Past Surgical History:  Procedure Laterality Date   AV FISTULA PLACEMENT Right 01/08/2017   Procedure: RIGHT ARM BRACHIOCEPHALIC ARTERIOVENOUS (AV) FISTULA CREATION;  Surgeon: Sheree Penne Bruckner, MD;  Location: [facility] OR;  Service: Vascular;  Laterality: Right;   AV FISTULA PLACEMENT Left 11/20/2018   Procedure: Creation of LEFT ARM ARTERIOVENOUS (AV)  FISTULA;  Surgeon: Sheree Penne Bruckner, MD;  Location: [facility] OR;  Service: Vascular;  Laterality: Left;   AV FISTULA PLACEMENT Right 02/24/2021   Procedure: RIGHT UPPER ARM BASILIC VEIN ARTERIOVENOUS (AV) FISTULA CREATION;  Surgeon: Magda Debby SAILOR, MD;  Location: [facility] OR;  Service: Vascular;  Laterality: Right;   AV FISTULA PLACEMENT Right 05/05/2021   Procedure: INSERTION OF ARTERIOVENOUS (AV) GORE-TEX GRAFT ARM;  Surgeon: Magda Debby SAILOR, MD;  Location: [facility] OR;  Service: Vascular;  Laterality: Right;   BASCILIC VEIN TRANSPOSITION Left 02/11/2019   Procedure: BASILIC VEIN TRANSPOSITION SECOND STAGE;  Surgeon: Sheree Penne Bruckner, MD;  Location: [facility] OR;  Service: Vascular;  Laterality: Left;   CARDIAC CATHETERIZATION N/A 06/01/2015   Procedure: Left Heart Cath and Coronary Angiography;  Surgeon: Victory LELON Sharps, MD; CFX 30%, no other dz, EF nl by echo   CESAREAN SECTION     x2   COLONOSCOPY N/A 10/30/2023   Procedure: COLONOSCOPY;  Surgeon: Cindie Carlin POUR, DO;  Location: [facility] END[facility] SUITE;  Service: Endoscopy;  Laterality: N/A;  9:15 am, asa 3  dialysis M,W &  F   COLONOSCOPY WITH PROPOFOL  N/A 07/27/2022   Procedure: COLONOSCOPY WITH PROPOFOL ;  Surgeon: Cindie Carlin POUR, DO;  Location: [facility];  Service: Endoscopy;  Laterality: N/A;  930am, asa 3, dialysis pt   DILATION AND CURETTAGE OF UTERUS     EYE SURGERY     Right eye pars plano vitrectomy    HEMATOMA EVACUATION Left 03/03/2019   Procedure: EVACUATION HEMATOMA;  Surgeon: Harvey Carlin BRAVO, MD;  Location: [facility] OR;  Service: Vascular;  Laterality: Left;   INSERTION OF DIALYSIS CATHETER Right 01/04/2021   Procedure: INSERTION OF RIGHT INTERNAL JUGULAR TUNNELED DIALYSIS CATHETER;  Surgeon: Harvey Carlin BRAVO, MD;  Location: [facility] OR;  Service: Vascular;  Laterality: Right;   LASIK     LIGATION OF ARTERIOVENOUS  FISTULA Left 01/04/2021   Procedure: LIGATION OF LEFT ARM ARTERIOVENOUS  FISTULA;  Surgeon: Harvey Carlin BRAVO, MD;  Location: [facility] OR;   Service: Vascular;  Laterality: Left;   PARS PLANA VITRECTOMY  04/20/2011   Procedure: PARS PLANA VITRECTOMY WITH 25 GAUGE;  Surgeon: Norleen JONETTA Ku, MD;  Location: [facility] OR;  Service: Ophthalmology;  Laterality: Left;  membrane peel, gas fluid exchange, endolaser, repair of complex retinal detachment   PATCH ANGIOPLASTY Left 10/05/2020   Procedure: PATCH ANGIOPLASTY USING GEORGE BIOLOGIC PATCH;  Surgeon: Harvey Carlin BRAVO, MD;  Location: [facility] OR;  Service: Vascular;  Laterality: Left;   PERIPHERAL VASCULAR BALLOON ANGIOPLASTY Left 09/02/2020   Procedure: PERIPHERAL VASCULAR BALLOON ANGIOPLASTY;  Surgeon: Gretta Lonni PARAS, MD;  Location: [facility] INVASIVE CV LAB;  Service: Cardiovascular;  Laterality: Left;  arm fistula   REMOVAL OF A DIALYSIS CATHETER N/A 06/21/2021   Procedure: MINOR REMOVAL OF TUNNELED DIALYSIS CATHETER;  Surgeon: Oris Krystal FALCON, MD;  Location: [facility] ORS;  Service: Vascular;  Laterality: N/A;   REVISON OF ARTERIOVENOUS FISTULA Left 10/05/2020   Procedure: REVISION OF LEFT ARM ARTERIOVENOUS FISTULA;  Surgeon: Harvey Carlin BRAVO, MD;  Location: [facility] OR;  Service: Vascular;  Laterality: Left;   ROBOTIC ASSISTED LAPAROSCOPIC VENTRAL/INCISIONAL HERNIA REPAIR N/A 08/23/2023   Procedure: OPEN PRIMARY REPAIR INCISIONAL HERNIA;  Surgeon: Tanda Locus, MD;  Location: [facility] ORS;  Service: General;  Laterality: N/A;  with Mesh   ROBOTIC ASSISTED TOTAL HYSTERECTOMY WITH BILATERAL SALPINGO OOPHERECTOMY Bilateral 08/23/2023   Procedure: HYSTERECTOMY, TOTAL, ROBOT-ASSISTED, LAPAROSCOPIC, WITH BILATERAL SALPINGO-OOPHORECTOMY, LYSIS OF ADHESIONS;  Surgeon: Viktoria Comer SAUNDERS, MD;  Location: [facility] ORS;  Service: Gynecology;  Laterality: Bilateral;   TUBAL LIGATION      Social History   Socioeconomic History   Marital status: Single    Spouse name: Not on file   Number of children: Not on file   Years of education: Not on file   Highest education level: Not on file  Occupational History   Not on file  Tobacco  Use   Smoking status: Every Day    Current packs/day: 0.00    Average packs/day: (1.2 ttl pk-yrs)    Types: Cigarettes    Start date: 06/05/1981    Last attempt to quit: 06/2022    Years since quitting: 1.5   Smokeless tobacco: Never   Tobacco comments:    Quit in 12/2018 but now smokes off and on per patient 05/04/21.  Vaping Use   Vaping status: Never Used  Substance and Sexual Activity   Alcohol  use: Yes    Comment: occ wine   Drug use: No   Sexual activity: Yes    Birth control/protection: Surgical    Comment: Tubal Ligation  Other Topics Concern   Not on file  Social History Narrative   Not on file   Social Drivers of Health   Financial Resource Strain: Not on file  Food Insecurity: No Food Insecurity (08/24/2023)   Hunger Vital Sign    Worried About Running Out of Food in the Last Year: Never true    Ran Out of Food in the Last Year: Never true  Recent Concern: Food Insecurity - Food Insecurity Present (07/24/2023)   Hunger Vital Sign    Worried About Running Out of Food in the Last Year: Sometimes true    Ran Out of Food in the Last Year: Sometimes true  Transportation Needs: No Transportation Needs (08/24/2023)   PRAPARE - Administrator, Civil Service (Medical): No    Lack of Transportation (Non-Medical): No  Physical Activity: Not on file  Stress: Not on file  Social Connections: Not on file    Family History  Problem Relation Age of Onset   Diabetes Mother    Kidney failure Mother    Diabetes Father    Amblyopia Neg Hx    Blindness Neg Hx    Cataracts Neg Hx    Glaucoma Neg Hx    Macular degeneration Neg Hx    Retinal detachment Neg Hx    Strabismus Neg Hx    Retinitis pigmentosa Neg Hx     Outpatient Encounter Medications as of 12/13/2023  Medication Sig   amLODipine  (NORVASC ) 10 MG tablet Take 1 tablet (10 mg total) by mouth daily.   atorvastatin  (LIPITOR ) 80 MG tablet Take 1 tablet (80 mg total) by mouth daily at 6 PM.   cloNIDine   (CATAPRES ) 0.2 MG tablet Take 0.2 mg by mouth 2 (two) times daily.   Continuous Blood Gluc Receiver (DEXCOM G6 RECEIVER) DEVI USE TO CHECK GLUCOSE FOUR TIMES DAILY AS DIRECTED.   diclofenac  Sodium (VOLTAREN ) 1 % GEL One pump tid prn   FLUoxetine  (PROZAC ) 40 MG capsule Take 40 mg by mouth daily.   furosemide  (LASIX ) 80 MG tablet Take 80 mg by mouth 2 (two) times daily.   gabapentin  (NEURONTIN ) 300 MG capsule TAKE (1) CAPSULE BY MOUTH DAILY. (Patient taking differently: Take 900 mg by mouth 2 (two) times daily.)   hydrALAZINE  (APRESOLINE ) 50 MG tablet Take 50 mg by mouth in the morning and at bedtime.   hydrOXYzine (ATARAX) 10 MG tablet Take 10 mg by mouth 3 (three) times daily as needed for anxiety.   insulin  degludec (TRESIBA  FLEXTOUCH) 100 UNIT/ML FlexTouch Pen Inject 30 Units into the skin at bedtime.   insulin  lispro (HUMALOG  KWIKPEN) 100 UNIT/ML KwikPen Inject 4-10 Units into the skin 3 (three) times daily.   lactulose (CHRONULAC) 10 GM/15ML solution Take 10 g by mouth daily.   Lancets (ONETOUCH DELICA PLUS LANCET33G) MISC Apply topically.   lidocaine -prilocaine  (EMLA ) cream Apply 1 Application topically as needed. One pump tid prn   lubiprostone  (AMITIZA ) 24 MCG capsule Take 1 capsule (24 mcg total) by mouth 2 (two) times daily with a meal.   omeprazole  (PRILOSEC  OTC) 20 MG tablet Take 20 mg by mouth daily before breakfast.   traZODone  (DESYREL ) 100 MG tablet Take 100 mg by mouth at bedtime.   ONETOUCH VERIO test strip SMARTSIG:Via Meter (Patient not taking: Reported on 12/13/2023)   polyethylene glycol (MIRALAX ) 17 g packet Take 17 g by mouth daily. (Patient not taking: Reported on 12/13/2023)   Semaglutide , 2 MG/DOSE, (OZEMPIC , 2 MG/DOSE,) 8 MG/3ML SOPN Inject 2 mg into  the skin once a week.   [DISCONTINUED] traMADol  (ULTRAM ) 50 MG tablet Take 1 tablet (50 mg total) by mouth every 12 (twelve) hours as needed for moderate pain (pain score 4-6) (mild to moderate pain). For AFTER surgery only,  do not take and drive (Patient not taking: Reported on 12/13/2023)   No facility-administered encounter medications on file as of 12/13/2023.    ALLERGIES: Allergies  Allergen Reactions   Contrast Media [Iodinated Contrast Media] Anaphylaxis    code blue--I died   Penicillins Rash and Other (See Comments)    Childhood     VACCINATION STATUS: Immunization History  Administered Date(s) Administered   Influenza,trivalent, recombinat, inj, PF 07/05/2016   Influenza-Unspecified 08/26/2020   Moderna Sars-Covid-2 Vaccination 10/27/2019, 11/24/2019   Pneumococcal Polysaccharide-23 09/05/2014, 08/26/2020    Diabetes She presents for her follow-up diabetic visit. She has type 2 diabetes mellitus. Onset time: She was diagnosed at approximate age of 54. Her disease course has been improving. There are no hypoglycemic associated symptoms. Associated symptoms include blurred vision, fatigue and foot paresthesias. Pertinent negatives for diabetes include no polydipsia, no polyphagia, no polyuria and no weight loss. There are no hypoglycemic complications. Symptoms are stable. Diabetic complications include a CVA, heart disease, nephropathy, peripheral neuropathy and retinopathy. (Legally blind in right eye, says she was hospitalized for DKA once; now on HD) Risk factors for coronary artery disease include diabetes mellitus, dyslipidemia, hypertension, obesity, sedentary lifestyle and tobacco exposure. Current diabetic treatment includes intensive insulin  program (and Ozempic ). She is compliant with treatment most of the time. Her weight is fluctuating minimally. She is following a generally healthy diet. When asked about meal planning, she reported none. She has not had a previous visit with a dietitian. She rarely participates in exercise. Her home blood glucose trend is decreasing steadily. Her overall blood glucose range is >200 mg/dl. (She presents today with her CGM showing fluctuating glycemic  profile overall.  Her POCT A1c today is 7.2%, increasing from last visit of 6.8%.  Analysis of her CGM shows TIR 31%, TAR 69%, TBR <1% with a GMI of 8.2%.  She notes she has been drinking coffee in the morning with cream and sugar and sometimes falls asleep early forgetting her Tresiba .) An ACE inhibitor/angiotensin II receptor blocker is not being taken. She does not see a podiatrist.Eye exam is current.  Hyperlipidemia This is a chronic problem. The current episode started more than 1 year ago. The problem is uncontrolled. Recent lipid tests were reviewed and are high. Exacerbating diseases include chronic renal disease, diabetes and obesity. Factors aggravating her hyperlipidemia include beta blockers and fatty foods. Current antihyperlipidemic treatment includes statins. The current treatment provides moderate improvement of lipids. Compliance problems include adherence to diet and adherence to exercise.  Risk factors for coronary artery disease include diabetes mellitus, dyslipidemia, hypertension, obesity and a sedentary lifestyle.  Hypertension This is a chronic problem. The current episode started more than 1 year ago. The problem has been gradually improving since onset. The problem is controlled. Associated symptoms include blurred vision. There are no associated agents to hypertension. Risk factors for coronary artery disease include diabetes mellitus, dyslipidemia, obesity, sedentary lifestyle and smoking/tobacco exposure. Past treatments include calcium  channel blockers, diuretics, central alpha agonists and beta blockers. The current treatment provides mild improvement. There are no compliance problems.  Hypertensive end-organ damage includes kidney disease, CAD/MI, CVA, heart failure and retinopathy. Identifiable causes of hypertension include chronic renal disease.    Review of systems  Constitutional: + decreasing  body weight,  current Body mass index is 36.6 kg/m. , + fatigue, no  subjective hyperthermia, no subjective hypothermia Eyes: + blurry vision (diabetic retinopathy and legally blind in Right eye), no xerophthalmia ENT: no sore throat, no nodules palpated in throat, no dysphagia/odynophagia, no hoarseness Cardiovascular: no chest pain, no shortness of breath, no palpitations, no leg swelling Respiratory: no cough, no shortness of breath Gastrointestinal: no nausea/vomiting/diarrhea, + chronic constipation r/t gastroparesis-improved somewhat Musculoskeletal: no muscle/joint aches Skin: no rashes, no hyperemia Neurological: no tremors, no dizziness, + numbness/tingling to BLE R>L Psychiatric: no depression, no anxiety   Objective:     BP 122/62 (BP Location: Left Arm, Patient Position: Sitting, Cuff Size: Large)   Pulse 93   Ht 5' 4 (1.626 m)   Wt 213 lb 3.2 oz (96.7 kg)   LMP  (LMP Unknown)   BMI 36.60 kg/m   Wt Readings from Last 3 Encounters:  12/13/23 213 lb 3.2 oz (96.7 kg)  09/21/23 218 lb (98.9 kg)  08/24/23 224 lb 10.4 oz (101.9 kg)    BP Readings from Last 3 Encounters:  12/13/23 122/62  10/30/23 (!) 149/82  09/21/23 115/61      Physical Exam- Limited  Constitutional:  Body mass index is 36.6 kg/m. , not in acute distress, normal state of mind Eyes:  EOMI, no exophthalmos Musculoskeletal: no gross deformities, strength intact in all four extremities, no gross restriction of joint movements Skin:  no rashes, no hyperemia Neurological: no tremor with outstretched hands   Diabetic Foot Exam - Simple   Simple Foot Form Visual Inspection No deformities, no ulcerations, no other skin breakdown bilaterally: Yes Sensation Testing Intact to touch and monofilament testing bilaterally: Yes Pulse Check Posterior Tibialis and Dorsalis pulse intact bilaterally: Yes Comments     CMP ( most recent) CMP     Component Value Date/Time   NA 133 (L) 10/30/2023 0900   K 5.4 (H) 10/30/2023 0900   CL 100 10/30/2023 0900   CO2 23  08/24/2023 0438   GLUCOSE 187 (H) 10/30/2023 0900   BUN 45 (H) 10/30/2023 0900   BUN 31 (A) 07/22/2021 0000   CREATININE 7.00 (H) 10/30/2023 0900   CALCIUM  8.7 (L) 08/24/2023 0438   PROT 7.6 06/25/2023 1354   ALBUMIN  3.8 06/25/2023 1354   AST 20 06/25/2023 1354   ALT 19 06/25/2023 1354   ALKPHOS 48 06/25/2023 1354   BILITOT 0.6 06/25/2023 1354   GFRNONAA 7 (L) 08/24/2023 0438   GFRAA 12 (L) 03/04/2019 0616     Diabetic Labs (most recent): Lab Results  Component Value Date   HGBA1C 7.2 (A) 12/13/2023   HGBA1C 6.8 (H) 08/14/2023   HGBA1C 6.8 (A) 08/13/2023     Lipid Panel ( most recent) Lipid Panel     Component Value Date/Time   CHOL 153 09/04/2022 1223   TRIG 122 09/04/2022 1223   HDL 55 09/04/2022 1223   CHOLHDL 2.8 09/04/2022 1223   CHOLHDL 5.4 05/23/2015 0545   VLDL 56 (H) 05/23/2015 0545   LDLCALC 76 09/04/2022 1223   LABVLDL 22 09/04/2022 1223      Lab Results  Component Value Date   TSH 0.524 09/04/2022   TSH 1.524 09/04/2014   FREET4 1.31 09/04/2022           Assessment & Plan:   1) Controlled diabetes mellitus with stage 4 chronic kidney disease (HCC)  She presents today with her CGM showing fluctuating glycemic profile overall.  Her POCT A1c today is  7.2%, increasing from last visit of 6.8%.  Analysis of her CGM shows TIR 31%, TAR 69%, TBR <1% with a GMI of 8.2%.  She notes she has been drinking coffee in the morning with cream and sugar and sometimes falls asleep early forgetting her Tresiba .  - Samantha Pierce has currently uncontrolled symptomatic type 2 DM since 53 years of age.  -Recent labs reviewed.  - I had a long discussion with her about the progressive nature of diabetes and the pathology behind its complications. -her diabetes is complicated by CVA, retinopathy, neuropathy, CKD, and DKA x 1 episode and she remains at a high risk for more acute and chronic complications which include worsening CAD, CVA, CKD, retinopathy, and  neuropathy. These are all discussed in detail with her.  - Nutritional counseling repeated at each appointment due to patients tendency to fall back in to old habits.  - The patient admits there is a room for improvement in their diet and drink choices. -  Suggestion is made for the patient to avoid simple carbohydrates from their diet including Cakes, Sweet Desserts / Pastries, Ice Cream, Soda (diet and regular), Sweet Tea, Candies, Chips, Cookies, Sweet Pastries, Store Bought Juices, Alcohol  in Excess of 1-2 drinks a day, Artificial Sweeteners, Coffee Creamer, and Sugar-free Products. This will help patient to have stable blood glucose profile and potentially avoid unintended weight gain.   - I encouraged the patient to switch to unprocessed or minimally processed complex starch and increased protein intake (animal or plant source), fruits, and vegetables.   - Patient is advised to stick to a routine mealtimes to eat 3 meals a day and avoid unnecessary snacks (to snack only to correct hypoglycemia).  - she will be scheduled with Penny Crumpton, RDN, CDE for diabetes education.  - I have approached her with the following individualized plan to manage  her diabetes and patient agrees:   -She is advised to continue Tresiba  30 units SQ nightly (but to start taking it at supper with the Novolog  to prevent her from falling asleep early and forgetting it), Novolog  4-10 units TID with meals if glucose is above 90 and she is eating (Specific instructions on how to titrate insulin  dosage based on glucose readings given to patient in writing), and Ozempic  2 mg SQ weekly.   -she is encouraged to continue monitoring blood glucose 4 times daily (using her CGM), before meals and before bed, and to call the clinic if she has readings less than 70 or greater than 300 for 3 tests in a row.   - she is warned not to take insulin  without proper monitoring per orders. - Adjustment parameters are given to her for  hypo and hyperglycemia in writing.  - she is not a candidate for Metformin , SGLT2 due to concurrent renal insufficiency.  - Specific targets for  A1c;  LDL, HDL,  and Triglycerides were discussed with the patient.  2) Blood Pressure /Hypertension:  her blood pressure is controlled to target.   she is advised to continue her current medications including Norvasc  10 mg po daily, Clonidine  0.2 mg po daily, Lasix  80 mg po twice daily, Hydralazine  50 mg po twice daily and Metoprolol  50 mg  mg p.o. daily with breakfast.  Will defer any med changes to nephrology.  3) Lipids/Hyperlipidemia:    Her recent lipid panel from 09/04/22 shows controlled LDL of 76.  She is advised to continue Lipitor  80 mg po daily before bed.  Side effects and precautions  discussed with her.    4)  Weight/Diet:  her Body mass index is 36.6 kg/m.  -  clearly complicating her diabetes care.   she is a candidate for weight loss. I discussed with her the fact that loss of 5 - 10% of her  current body weight will have the most impact on her diabetes management.  Exercise, and detailed carbohydrates information provided  -  detailed on discharge instructions.  5) Chronic Care/Health Maintenance: -she is not on ACEI/ARB and is on Statin medications and is encouraged to initiate and continue to follow up with Ophthalmology, Dentist, Podiatrist at least yearly or according to recommendations, and advised to stay away from smoking. I have recommended yearly flu vaccine and pneumonia vaccine at least every 5 years; moderate intensity exercise for up to 150 minutes weekly; and sleep for at least 7 hours a day.  - she is advised to maintain close follow up with Delores Rojelio Caldron, NP for primary care needs, as well as her other providers for optimal and coordinated care.     I spent  48  minutes in the care of the patient today including review of labs from CMP, Lipids, Thyroid  Function, Hematology (current and previous including  abstractions from other facilities); face-to-face time discussing  her blood glucose readings/logs, discussing hypoglycemia and hyperglycemia episodes and symptoms, medications doses, her options of short and long term treatment based on the latest standards of care / guidelines;  discussion about incorporating lifestyle medicine;  and documenting the encounter. Risk reduction counseling performed per USPSTF guidelines to reduce obesity and cardiovascular risk factors.     Please refer to Patient Instructions for Blood Glucose Monitoring and Insulin /Medications Dosing Guide  in media tab for additional information. Please  also refer to  Patient Self Inventory in the Media  tab for reviewed elements of pertinent patient history.  Samantha Pierce participated in the discussions, expressed understanding, and voiced agreement with the above plans.  All questions were answered to her satisfaction. she is encouraged to contact clinic should she have any questions or concerns prior to her return visit.   Follow up plan: - Return in about 4 months (around 04/14/2024) for Diabetes F/U with A1c in office, No previsit labs, Bring meter and logs.  Benton Rio, [facility] [address] [place], [place] [address] Phone: [phone] Fax: [phone]  12/13/2023, 12:07 PM

## 2023-12-15 ENCOUNTER — Other Ambulatory Visit: Payer: Self-pay

## 2023-12-15 ENCOUNTER — Emergency Department (HOSPITAL_COMMUNITY)
Admission: EM | Admit: 2023-12-15 | Discharge: 2023-12-15 | Disposition: A | Discharge status: Home / Self Care | Attending: Emergency Medicine | Admitting: Emergency Medicine

## 2023-12-15 ENCOUNTER — Encounter (HOSPITAL_COMMUNITY): Payer: Self-pay | Admitting: Emergency Medicine

## 2023-12-15 DIAGNOSIS — I132 Hypertensive heart and chronic kidney disease with heart failure and with stage 5 chronic kidney disease, or end stage renal disease: Secondary | ICD-10-CM | POA: Insufficient documentation

## 2023-12-15 DIAGNOSIS — Z794 Long term (current) use of insulin: Secondary | ICD-10-CM | POA: Diagnosis not present

## 2023-12-15 DIAGNOSIS — M62830 Muscle spasm of back: Secondary | ICD-10-CM | POA: Diagnosis not present

## 2023-12-15 DIAGNOSIS — J449 Chronic obstructive pulmonary disease, unspecified: Secondary | ICD-10-CM | POA: Insufficient documentation

## 2023-12-15 DIAGNOSIS — E1122 Type 2 diabetes mellitus with diabetic chronic kidney disease: Secondary | ICD-10-CM | POA: Diagnosis not present

## 2023-12-15 DIAGNOSIS — M546 Pain in thoracic spine: Secondary | ICD-10-CM | POA: Diagnosis present

## 2023-12-15 DIAGNOSIS — N186 End stage renal disease: Secondary | ICD-10-CM | POA: Diagnosis not present

## 2023-12-15 DIAGNOSIS — I509 Heart failure, unspecified: Secondary | ICD-10-CM | POA: Insufficient documentation

## 2023-12-15 DIAGNOSIS — Z992 Dependence on renal dialysis: Secondary | ICD-10-CM | POA: Diagnosis not present

## 2023-12-15 DIAGNOSIS — F17210 Nicotine dependence, cigarettes, uncomplicated: Secondary | ICD-10-CM | POA: Diagnosis not present

## 2023-12-15 DIAGNOSIS — Z79899 Other long term (current) drug therapy: Secondary | ICD-10-CM | POA: Insufficient documentation

## 2023-12-15 MED ORDER — LIDOCAINE 5 % EX PTCH
1.0000 | MEDICATED_PATCH | Freq: Once | CUTANEOUS | Status: DC
  Administered 2023-12-15: 1 via TRANSDERMAL
  Filled 2023-12-15: qty 1

## 2023-12-15 MED ORDER — CYCLOBENZAPRINE HCL 10 MG PO TABS: 10.0000 mg | ORAL_TABLET | Freq: Two times a day (BID) | ORAL | 0 refills | Status: AC | PRN

## 2023-12-15 MED ORDER — OXYCODONE HCL 5 MG PO TABS: 5.0000 mg | ORAL_TABLET | Freq: Four times a day (QID) | ORAL | 0 refills | Status: DC | PRN

## 2023-12-15 MED ORDER — OXYCODONE HCL 5 MG PO TABS
5.0000 mg | ORAL_TABLET | Freq: Once | ORAL | Status: AC
  Administered 2023-12-15: 5 mg via ORAL
  Filled 2023-12-15: qty 1

## 2023-12-15 MED ORDER — LIDOCAINE 5 % EX PTCH: 1.0000 | MEDICATED_PATCH | Freq: Every day | CUTANEOUS | 0 refills | Status: AC | PRN

## 2023-12-15 NOTE — Discharge Instructions (Addendum)
 It was a pleasure caring for you today in the emergency department.  Avoid heavy lifting for the next week, take medications as prescribed. Please follow up with your pcp for recheck.   Please return to the emergency department for any worsening or worrisome symptoms or if your symptoms do not improve.

## 2023-12-15 NOTE — ED Provider Notes (Signed)
 [place] EMERGENCY DEPARTMENT AT [facility] Provider Note  CSN: [id number] Arrival date & time: 12/15/23 9293  Chief Complaint(s) Back Pain  HPI Samantha Pierce is a 53 y.o. female with past medical history as below, significant for CHF, bipolar disorder, GERD, COPD, CKD MWF who presents to the ED with complaint of back pain  Patient reports he has been having an aching sensation to her left upper back over the past 3 to 4 weeks.  She reports that the pain oftentimes worse at dialysis because she has to sit in a comfort position for multiple hours.  She has not missed any dialysis sessions, last session was on Friday but she stopped approximately 45 minutes early because her left upper back was hurting.  She has been trying Tylenol  PM and gabapentin  and lidocaine  patches for the pain.  His lidocaine  patches do significantly improve her pain and allow her to sleep.  She is not having numbness or weakness to her extremities, no neck pain or pain with swallowing or difficulty speaking.  No difficulty breathing.  No chest pain.  No fevers or chills.  Denies rashes, denies acute injuries to this area denies pain to her back otherwise  Past Medical History Past Medical History:  Diagnosis Date   Anemia    Anxiety    Asystole (HCC)    a. 05/2015: pt developed bradycardia->asystole during Lexiscan  nuclear stress test, s/p brief code. Cath with only 30% prox LCx. Her asystole during Lexiscan  infusion was felt to represent an excessive pharmacologic response to the agent and likely superimposed vasovagal response.   Bipolar disorder (HCC)    CHF (congestive heart failure) (HCC)    CKD (chronic kidney disease) stage 4, GFR 15-29 ml/min (HCC)    M_W_F dialysis   COPD (chronic obstructive pulmonary disease) (HCC)    Depression    Essential hypertension    GERD (gastroesophageal reflux disease)    History of blood transfusion 2011   History of seizure 2017   only one seizure - none  since   History of stroke 09/2014   Acute lacunar infarct of the left thalamus   Leg cramps    Mild CAD    a. LHC 05/2015: 30% prox Cx, otherwise widely patent.   Morbid obesity (HCC)    Neuromuscular disorder (HCC)    neuropathy in feet   Neuropathy    feet   PTSD (post-traumatic stress disorder)    Retinal detachment    legally blind in right eye   Stroke (HCC)    x 2, right side weakness   Type 2 diabetes mellitus (HCC)    Patient Active Problem List   Diagnosis Date Noted   Adnexal mass 08/23/2023   Ovarian mass 08/23/2023   Bilateral carpal tunnel syndrome 08/09/2023   Cyst of ovary 07/27/2023   History of stroke 07/27/2023   Arteriovenous fistula (HCC) 11/05/2020   Dependence on renal dialysis (HCC) 11/05/2020   Immunodeficiency, unspecified (HCC) 11/05/2020   Secondary hyperparathyroidism of renal origin (HCC) 11/05/2020   ESRD (end stage renal disease) (HCC) 10/05/2020   Anemia in chronic kidney disease (CODE) 04/03/2019   Other lipoprotein metabolism disorders 04/03/2019   Traumatic hematoma of left upper arm 03/03/2019   Chronic kidney disease (CKD), stage IV (severe) (HCC) 02/26/2019   Tobacco abuse 01/20/2019   Cocaine abuse (HCC) 01/20/2019   Uncontrolled type 2 diabetes mellitus with hyperglycemia, with long-term current use of insulin  (HCC) 01/20/2019   Acute diastolic CHF (congestive heart failure) (  HCC) 01/20/2019   Dyspnea 01/19/2019   AKI (acute kidney injury) (HCC) 11/19/2017   COPD (chronic obstructive pulmonary disease) (HCC) 11/19/2017   GERD (gastroesophageal reflux disease) 11/19/2017   Acute kidney failure, unspecified (HCC) 11/19/2017   Mild CAD 06/02/2015   Bradycardia 06/02/2015   Morbid obesity (HCC)    Asystole (HCC)    Pain in the chest    Hyperlipidemia    TIA (transient ischemic attack) 05/22/2015   Diastolic dysfunction 09/05/2014   Essential hypertension 09/04/2014   Uncontrolled diabetes mellitus with stage 4 chronic kidney  disease 09/04/2014   Lacunar stroke, acute (HCC) 09/04/2014   Paresthesias/numbness 09/04/2014   CKD (chronic kidney disease), stage III (HCC) 09/04/2014   Obesity 09/04/2014   Cerebrovascular accident (CVA) (HCC) 09/04/2014   Proliferative diabetic retinopathy of both eyes (HCC) 04/11/2011   Retinal detachment, traction 04/11/2011   Home Medication(s) Prior to Admission medications   Medication Sig Start Date End Date Taking? Authorizing Provider  amLODipine  (NORVASC ) 10 MG tablet Take 1 tablet (10 mg total) by mouth daily. 01/21/19   Evonnie Lenis, MD  atorvastatin  (LIPITOR ) 80 MG tablet Take 1 tablet (80 mg total) by mouth daily at 6 PM. 08/05/15   Debera Jayson MATSU, MD  cloNIDine  (CATAPRES ) 0.2 MG tablet Take 0.2 mg by mouth 2 (two) times daily.    [provider]  Continuous Blood Gluc Receiver (DEXCOM G6 RECEIVER) DEVI USE TO CHECK GLUCOSE FOUR TIMES DAILY AS DIRECTED. 10/08/20   Therisa Benton PARAS, NP  diclofenac  Sodium (VOLTAREN ) 1 % GEL One pump tid prn 08/09/23   Onita Duos, MD  FLUoxetine  (PROZAC ) 40 MG capsule Take 40 mg by mouth daily.    [provider]  furosemide  (LASIX ) 80 MG tablet Take 80 mg by mouth 2 (two) times daily.    [provider]  gabapentin  (NEURONTIN ) 300 MG capsule TAKE (1) CAPSULE BY MOUTH DAILY. Patient taking differently: Take 900 mg by mouth 2 (two) times daily. 07/04/22   Sheree Penne Bruckner, MD  hydrALAZINE  (APRESOLINE ) 50 MG tablet Take 50 mg by mouth in the morning and at bedtime.    [provider]  hydrOXYzine (ATARAX) 10 MG tablet Take 10 mg by mouth 3 (three) times daily as needed for anxiety.    [provider]  insulin  degludec (TRESIBA  FLEXTOUCH) 100 UNIT/ML FlexTouch Pen Inject 30 Units into the skin at bedtime. 08/13/23   Therisa Benton PARAS, NP  insulin  lispro (HUMALOG  KWIKPEN) 100 UNIT/ML KwikPen Inject 4-10 Units into the skin 3 (three) times daily. 08/13/23   Therisa Benton PARAS, NP  lactulose  (CHRONULAC) 10 GM/15ML solution Take 10 g by mouth daily. 06/22/23   [provider]  Lancets [facility] DELICA PLUS [place]) MISC Apply topically. 12/15/20   [provider]  lidocaine -prilocaine  (EMLA ) cream Apply 1 Application topically as needed. One pump tid prn 08/09/23   Onita Duos, MD  lubiprostone  (AMITIZA ) 24 MCG capsule Take 1 capsule (24 mcg total) by mouth 2 (two) times daily with a meal. 08/29/23   Rudy Josette RAMAN, PA-C  omeprazole  (PRILOSEC  OTC) 20 MG tablet Take 20 mg by mouth daily before breakfast.    [provider]  AISHA VERIO test strip SMARTSIG:Via Meter Patient not taking: Reported on 12/13/2023 12/15/20   [provider]  polyethylene glycol (MIRALAX ) 17 g packet Take 17 g by mouth daily. Patient not taking: Reported on 12/13/2023 06/25/23   Yolande Lamar BROCKS, MD  Semaglutide , 2 MG/DOSE, (OZEMPIC , 2 MG/DOSE,) 8 MG/3ML [facility]  Inject 2 mg into the skin once a week. 08/13/23   Therisa Benton PARAS, NP  traZODone  (DESYREL ) 100 MG tablet Take 100 mg by mouth at bedtime.    [provider]                                                                                                                                    Past Surgical History Past Surgical History:  Procedure Laterality Date   AV FISTULA PLACEMENT Right 01/08/2017   Procedure: RIGHT ARM BRACHIOCEPHALIC ARTERIOVENOUS (AV) FISTULA CREATION;  Surgeon: Sheree Penne Bruckner, MD;  Location: [facility] OR;  Service: Vascular;  Laterality: Right;   AV FISTULA PLACEMENT Left 11/20/2018   Procedure: Creation of LEFT ARM ARTERIOVENOUS (AV) FISTULA;  Surgeon: Sheree Penne Bruckner, MD;  Location: [facility] OR;  Service: Vascular;  Laterality: Left;   AV FISTULA PLACEMENT Right 02/24/2021   Procedure: RIGHT UPPER ARM BASILIC VEIN ARTERIOVENOUS (AV) FISTULA CREATION;  Surgeon: Magda Debby SAILOR, MD;  Location: [facility] OR;  Service: Vascular;  Laterality: Right;   AV FISTULA PLACEMENT Right 05/05/2021    Procedure: INSERTION OF ARTERIOVENOUS (AV) GORE-TEX GRAFT ARM;  Surgeon: Magda Debby SAILOR, MD;  Location: [facility] OR;  Service: Vascular;  Laterality: Right;   BASCILIC VEIN TRANSPOSITION Left 02/11/2019   Procedure: BASILIC VEIN TRANSPOSITION SECOND STAGE;  Surgeon: Sheree Penne Bruckner, MD;  Location: [facility] OR;  Service: Vascular;  Laterality: Left;   CARDIAC CATHETERIZATION N/A 06/01/2015   Procedure: Left Heart Cath and Coronary Angiography;  Surgeon: Victory LELON Sharps, MD; CFX 30%, no other dz, EF nl by echo   CESAREAN SECTION     x2   COLONOSCOPY N/A 10/30/2023   Procedure: COLONOSCOPY;  Surgeon: Cindie Carlin POUR, DO;  Location: [facility] ENDO SUITE;  Service: Endoscopy;  Laterality: N/A;  9:15 am, asa 3  dialysis M,W & F   COLONOSCOPY WITH PROPOFOL  N/A 07/27/2022   Procedure: COLONOSCOPY WITH PROPOFOL ;  Surgeon: Cindie Carlin POUR, DO;  Location: [facility]ITE;  Service: Endoscopy;  Laterality: N/A;  930am, asa 3, dialysis pt   DILATION AND CURETTAGE OF UTERUS     EYE SURGERY     Right eye pars plano vitrectomy    HEMATOMA EVACUATION Left 03/03/2019   Procedure: EVACUATION HEMATOMA;  Surgeon: Harvey Carlin BRAVO, MD;  Location: [facility] OR;  Service: Vascular;  Laterality: Left;   INSERTION OF DIALYSIS CATHETER Right 01/04/2021   Procedure: INSERTION OF RIGHT INTERNAL JUGULAR TUNNELED DIALYSIS CATHETER;  Surgeon: Harvey Carlin BRAVO, MD;  Location: [facility] OR;  Service: Vascular;  Laterality: Right;   LASIK     LIGATION OF ARTERIOVENOUS  FISTULA Left 01/04/2021   Procedure: LIGATION OF LEFT ARM ARTERIOVENOUS  FISTULA;  Surgeon: Harvey Carlin BRAVO, MD;  Location: [facility] OR;  Service: Vascular;  Laterality: Left;   PARS PLANA VITRECTOMY  04/20/2011   Procedure: PARS PLANA VITRECTOMY WITH 25 GAUGE;  Surgeon: Norleen JONETTA Ku, MD;  Location: [facility] OR;  Service: Ophthalmology;  Laterality: Left;  membrane peel, gas fluid exchange, endolaser, repair of complex retinal detachment   PATCH ANGIOPLASTY Left 10/05/2020   Procedure: PATCH  ANGIOPLASTY USING GEORGE BIOLOGIC PATCH;  Surgeon: Harvey Carlin BRAVO, MD;  Location: [facility] OR;  Service: Vascular;  Laterality: Left;   PERIPHERAL VASCULAR BALLOON ANGIOPLASTY Left 09/02/2020   Procedure: PERIPHERAL VASCULAR BALLOON ANGIOPLASTY;  Surgeon: Gretta Lonni PARAS, MD;  Location: [facility] INVASIVE CV LAB;  Service: Cardiovascular;  Laterality: Left;  arm fistula   REMOVAL OF A DIALYSIS CATHETER N/A 06/21/2021   Procedure: MINOR REMOVAL OF TUNNELED DIALYSIS CATHETER;  Surgeon: Oris Krystal FALCON, MD;  Location: [facility] ORS;  Service: Vascular;  Laterality: N/A;   REVISON OF ARTERIOVENOUS FISTULA Left 10/05/2020   Procedure: REVISION OF LEFT ARM ARTERIOVENOUS FISTULA;  Surgeon: Harvey Carlin BRAVO, MD;  Location: [facility] OR;  Service: Vascular;  Laterality: Left;   ROBOTIC ASSISTED LAPAROSCOPIC VENTRAL/INCISIONAL HERNIA REPAIR N/A 08/23/2023   Procedure: OPEN PRIMARY REPAIR INCISIONAL HERNIA;  Surgeon: Tanda Locus, MD;  Location: [facility] ORS;  Service: General;  Laterality: N/A;  with Mesh   ROBOTIC ASSISTED TOTAL HYSTERECTOMY WITH BILATERAL SALPINGO OOPHERECTOMY Bilateral 08/23/2023   Procedure: HYSTERECTOMY, TOTAL, ROBOT-ASSISTED, LAPAROSCOPIC, WITH BILATERAL SALPINGO-OOPHORECTOMY, LYSIS OF ADHESIONS;  Surgeon: Viktoria Comer SAUNDERS, MD;  Location: [facility] ORS;  Service: Gynecology;  Laterality: Bilateral;   TUBAL LIGATION     Family History Family History  Problem Relation Age of Onset   Diabetes Mother    Kidney failure Mother    Diabetes Father    Amblyopia Neg Hx    Blindness Neg Hx    Cataracts Neg Hx    Glaucoma Neg Hx    Macular degeneration Neg Hx    Retinal detachment Neg Hx    Strabismus Neg Hx    Retinitis pigmentosa Neg Hx     Social History Social History   Tobacco Use   Smoking status: Every Day    Current packs/day: 0.00    Average packs/day: (1.2 ttl pk-yrs)    Types: Cigarettes    Start date: 06/05/1981    Last attempt to quit: 06/2022    Years since quitting: 1.5   Smokeless tobacco: Never    Tobacco comments:    Quit in 12/2018 but now smokes off and on per patient 05/04/21.  Vaping Use   Vaping status: Never Used  Substance Use Topics   Alcohol  use: Yes    Comment: occ wine   Drug use: No   Allergies Contrast media [iodinated contrast media] and Penicillins  Review of Systems A thorough review of systems was obtained and all systems are negative except as noted in the HPI and PMH.   Physical Exam Vital Signs  I have reviewed the triage vital signs BP 121/82 (BP Location: Left Arm)   Pulse 86   Temp 98.2 F (36.8 C) (Oral)   Resp 18   Ht 5' 4 (1.626 m)   Wt 96.7 kg   LMP  (LMP Unknown)   SpO2 95%   BMI 36.59 kg/m  Physical Exam Vitals and nursing note reviewed.  Constitutional:      General: She is not in acute distress.    Appearance: Normal appearance. She is well-developed. She is not ill-appearing.  HENT:     Head: Normocephalic and atraumatic.     Right Ear: External ear normal.     Left Ear: External ear normal.     Nose: Nose normal.  Mouth/Throat:     Mouth: Mucous membranes are moist.  Eyes:     General: No scleral icterus.       Right eye: No discharge.        Left eye: No discharge.  Cardiovascular:     Rate and Rhythm: Normal rate.  Pulmonary:     Effort: Pulmonary effort is normal. No respiratory distress.     Breath sounds: No stridor.  Abdominal:     General: Abdomen is flat. There is no distension.     Palpations: Abdomen is soft.     Tenderness: There is no guarding.  Musculoskeletal:        General: No deformity.     Cervical back: No rigidity. No spinous process tenderness.       Back:     Comments: Muscles appear to be spasm, TTP around her scapula  No midline spinous process tenderness to palpation or percussion, no crepitus or step-off.    Bilateral upper extremities are NVI, pulses are equal, cap refill is brisk  Skin:    General: Skin is warm and dry.     Coloration: Skin is not cyanotic, jaundiced or pale.       Neurological:     Mental Status: She is alert and oriented to person, place, and time.     GCS: GCS eye subscore is 4. GCS verbal subscore is 5. GCS motor subscore is 6.     Cranial Nerves: No dysarthria or facial asymmetry.     Sensory: No sensory deficit.     Motor: No weakness or tremor.     Coordination: Coordination is intact.     Gait: Gait is intact.  Psychiatric:        Speech: Speech normal.        Behavior: Behavior normal. Behavior is cooperative.     ED Results and Treatments Labs (all labs ordered are listed, but only abnormal results are displayed) Labs Reviewed - No data to display                                                                                                                        Radiology No results found.  Pertinent labs & imaging results that were available during my care of the patient were reviewed by me and considered in my medical decision making (see MDM for details).  Medications Ordered in ED Medications  oxyCODONE  (Oxy IR/ROXICODONE ) immediate release tablet 5 mg (has no administration in time range)  lidocaine  (LIDODERM ) 5 % 1 patch (has no administration in time range)  Procedures Procedures  (including critical care time)  Medical Decision Making / ED Course    Medical Decision Making:    JAQUASIA DOSCHER is a 53 y.o. female with past medical history as below, significant for CHF, bipolar disorder, GERD, COPD, CKD MWF who presents to the ED with complaint of back pain. The complaint involves an extensive differential diagnosis and also carries with it a high risk of complications and morbidity.  Serious etiology was considered. Ddx includes but is not limited to: Musculoskeletal injury, referred pain, hematoma, muscle spasm, etc.  Complete initial physical exam performed, notably the patient  was in no distress, sitting comfortably on stretcher.    Reviewed and confirmed nursing documentation for past medical history, family history, social history.  Vital signs reviewed.    Thoracic pain > - Pain ongoing multiple weeks, worsened with certain positions - Patient did find some relief with lidocaine  patches at home.,  Will advise her to continue taking lidocaine  patches, will add muscle relaxer and opiate. - Follow-up with PCP within the week - This is very likely musculoskeletal pain.  Pain is localized and easily reproducible, provoked by certain positions.  She has no evidence of cord compression or cauda equina, no evidence of infection.  She is neurologically intact.  Patient in no distress and overall condition is stable. Detailed discussions were had with the patient/guardian regarding current findings, and need for close f/u with PCP or on call doctor. The patient/guardian has been instructed to return immediately if the symptoms worsen in any way for re-evaluation. Patient/guardian verbalized understanding and is in agreement with current care plan. All questions answered prior to discharge.                     Additional history obtained: -Additional history obtained from na -External records from outside source obtained and reviewed including: Chart review including previous notes, labs, imaging, consultation notes including  Pdmp Primary care documentation   Lab Tests: na  EKG   EKG Interpretation Date/Time:    Ventricular Rate:    PR Interval:    QRS Duration:    QT Interval:    QTC Calculation:   R Axis:      Text Interpretation:           Imaging Studies ordered: na   Medicines ordered and prescription drug management: Meds ordered this encounter  Medications   oxyCODONE  (Oxy IR/ROXICODONE ) immediate release tablet 5 mg    Refill:  0   lidocaine  (LIDODERM ) 5 % 1 patch    -I have reviewed the patients home medicines and have  made adjustments as needed   Consultations Obtained: na   Cardiac Monitoring: Continuous pulse oximetry interpreted by myself, 100% on ra.    Social Determinants of Health:  Diagnosis or treatment significantly limited by social determinants of health: obesity   Reevaluation: After the interventions noted above, I reevaluated the patient and found that they have improved  Co morbidities that complicate the patient evaluation  Past Medical History:  Diagnosis Date   Anemia    Anxiety    Asystole (HCC)    a. 05/2015: pt developed bradycardia->asystole during Lexiscan  nuclear stress test, s/p brief code. Cath with only 30% prox LCx. Her asystole during Lexiscan  infusion was felt to represent an excessive pharmacologic response to the agent and likely superimposed vasovagal response.   Bipolar disorder (HCC)    CHF (congestive heart failure) (HCC)    CKD (chronic kidney disease) stage 4,  GFR 15-29 ml/min (HCC)    M_W_F dialysis   COPD (chronic obstructive pulmonary disease) (HCC)    Depression    Essential hypertension    GERD (gastroesophageal reflux disease)    History of blood transfusion 2011   History of seizure 2017   only one seizure - none since   History of stroke 09/2014   Acute lacunar infarct of the left thalamus   Leg cramps    Mild CAD    a. LHC 05/2015: 30% prox Cx, otherwise widely patent.   Morbid obesity (HCC)    Neuromuscular disorder (HCC)    neuropathy in feet   Neuropathy    feet   PTSD (post-traumatic stress disorder)    Retinal detachment    legally blind in right eye   Stroke (HCC)    x 2, right side weakness   Type 2 diabetes mellitus (HCC)       Dispostion: Disposition decision including need for hospitalization was considered, and patient discharged from emergency department.    Final Clinical Impression(s) / ED Diagnoses Final diagnoses:  Muscle spasm of back        Elnor Jayson LABOR, DO 12/15/23 9171

## 2023-12-15 NOTE — ED Triage Notes (Signed)
 Pt reports left upper back pain and burning for a month. No relief with lidocaine  patches. States it feels like the bone is coming out. Pt has reached out to dialysis doc but do not believe it is related.

## 2023-12-31 NOTE — Progress Notes (Unsigned)
 Referring Provider: Delores Rojelio Caldron, NP Primary Care Physician:  Delores Rojelio Caldron, NP Primary GI Physician: Dr. Cindie  No chief complaint on file.   HPI:   Samantha Pierce is a 53 y.o. female with history of ESRD on dialysis, COPD, HTN, stroke, diabetes, depression, bipolar disorder, GERD, chronic constipation, presenting today for follow-up s/p colonoscopy.   Last seen in the office 08/01/2023.  Constipation not adequately managed with Linzess  290 mcg.  She had also been recently started on lactulose about 3 weeks prior, 30 mL daily with some improvement, but still only having 1 or 2 bowel movements a week.  Associated bloating and abdominal discomfort that resolved when she was able to move her bowels.  Had also noted gas coming through her vagina and would have a brown spot on her pad.  She had upcoming surgery scheduled for total hysterectomy with bilateral salpingo-oophorectomy for complex adnexal mass, but was advised that she needed to have colonoscopy prior to surgery.  Linzess  was stopped and she was given samples of Ibsrela , continue lactulose, proceed with colonoscopy, CT A/P with oral and rectal contrast to evaluate for colovaginal fistula.  Patient did undergo planned surgery. Path was benign.   CT was never completed.   Patient reported Ibsrela  works for her, but the medication was ultimately denied with insurance requesting trial of Amitiza  or Trulance.  Amitiza  24 mcg twice daily was sent to her pharmacy.   Colonoscopy 10/30/2023: - Hemorrhoids found on perianal exam.  - Non- bleeding internal hemorrhoids.  - Redundant colon.  - The examination was otherwise normal. - No specimens collected.. - Recommended repeat colonoscopy in 10 years.   Today:    Past Medical History:  Diagnosis Date   Anemia    Anxiety    Asystole (HCC)    a. 05/2015: pt developed bradycardia->asystole during Lexiscan  nuclear stress test, s/p brief code. Cath with only 30% prox LCx.  Her asystole during Lexiscan  infusion was felt to represent an excessive pharmacologic response to the agent and likely superimposed vasovagal response.   Bipolar disorder (HCC)    CHF (congestive heart failure) (HCC)    CKD (chronic kidney disease) stage 4, GFR 15-29 ml/min (HCC)    M_W_F dialysis   COPD (chronic obstructive pulmonary disease) (HCC)    Depression    Essential hypertension    GERD (gastroesophageal reflux disease)    History of blood transfusion 2011   History of seizure 2017   only one seizure - none since   History of stroke 09/2014   Acute lacunar infarct of the left thalamus   Leg cramps    Mild CAD    a. LHC 05/2015: 30% prox Cx, otherwise widely patent.   Morbid obesity (HCC)    Neuromuscular disorder (HCC)    neuropathy in feet   Neuropathy    feet   PTSD (post-traumatic stress disorder)    Retinal detachment    legally blind in right eye   Stroke (HCC)    x 2, right side weakness   Type 2 diabetes mellitus (HCC)     Past Surgical History:  Procedure Laterality Date   AV FISTULA PLACEMENT Right 01/08/2017   Procedure: RIGHT ARM BRACHIOCEPHALIC ARTERIOVENOUS (AV) FISTULA CREATION;  Surgeon: Sheree Penne Bruckner, MD;  Location: [facility] OR;  Service: Vascular;  Laterality: Right;   AV FISTULA PLACEMENT Left 11/20/2018   Procedure: Creation of LEFT ARM ARTERIOVENOUS (AV) FISTULA;  Surgeon: Sheree Penne Bruckner, MD;  Location: [facility] OR;  Service: Vascular;  Laterality: Left;   AV FISTULA PLACEMENT Right 02/24/2021   Procedure: RIGHT UPPER ARM BASILIC VEIN ARTERIOVENOUS (AV) FISTULA CREATION;  Surgeon: Magda Debby SAILOR, MD;  Location: [facility] OR;  Service: Vascular;  Laterality: Right;   AV FISTULA PLACEMENT Right 05/05/2021   Procedure: INSERTION OF ARTERIOVENOUS (AV) GORE-TEX GRAFT ARM;  Surgeon: Magda Debby SAILOR, MD;  Location: [facility] OR;  Service: Vascular;  Laterality: Right;   BASCILIC VEIN TRANSPOSITION Left 02/11/2019   Procedure: BASILIC VEIN TRANSPOSITION SECOND  STAGE;  Surgeon: Sheree Penne Bruckner, MD;  Location: [facility] OR;  Service: Vascular;  Laterality: Left;   CARDIAC CATHETERIZATION N/A 06/01/2015   Procedure: Left Heart Cath and Coronary Angiography;  Surgeon: Victory LELON Sharps, MD; CFX 30%, no other dz, EF nl by echo   CESAREAN SECTION     x2   COLONOSCOPY N/A 10/30/2023   Procedure: COLONOSCOPY;  Surgeon: Cindie Carlin POUR, DO;  Location: [facility];  Service: Endoscopy;  Laterality: N/A;  9:15 am, asa 3  dialysis M,W & F   COLONOSCOPY WITH PROPOFOL  N/A 07/27/2022   Procedure: COLONOSCOPY WITH PROPOFOL ;  Surgeon: Cindie Carlin POUR, DO;  Location: [facility];  Service: Endoscopy;  Laterality: N/A;  930am, asa 3, dialysis pt   DILATION AND CURETTAGE OF UTERUS     EYE SURGERY     Right eye pars plano vitrectomy    HEMATOMA EVACUATION Left 03/03/2019   Procedure: EVACUATION HEMATOMA;  Surgeon: Harvey Carlin BRAVO, MD;  Location: [facility] OR;  Service: Vascular;  Laterality: Left;   INSERTION OF DIALYSIS CATHETER Right 01/04/2021   Procedure: INSERTION OF RIGHT INTERNAL JUGULAR TUNNELED DIALYSIS CATHETER;  Surgeon: Harvey Carlin BRAVO, MD;  Location: [facility] OR;  Service: Vascular;  Laterality: Right;   LASIK     LIGATION OF ARTERIOVENOUS  FISTULA Left 01/04/2021   Procedure: LIGATION OF LEFT ARM ARTERIOVENOUS  FISTULA;  Surgeon: Harvey Carlin BRAVO, MD;  Location: [facility] OR;  Service: Vascular;  Laterality: Left;   PARS PLANA VITRECTOMY  04/20/2011   Procedure: PARS PLANA VITRECTOMY WITH 25 GAUGE;  Surgeon: Norleen JONETTA Ku, MD;  Location: [facility] OR;  Service: Ophthalmology;  Laterality: Left;  membrane peel, gas fluid exchange, endolaser, repair of complex retinal detachment   PATCH ANGIOPLASTY Left 10/05/2020   Procedure: PATCH ANGIOPLASTY USING GEORGE BIOLOGIC PATCH;  Surgeon: Harvey Carlin BRAVO, MD;  Location: [facility] OR;  Service: Vascular;  Laterality: Left;   PERIPHERAL VASCULAR BALLOON ANGIOPLASTY Left 09/02/2020   Procedure: PERIPHERAL VASCULAR BALLOON ANGIOPLASTY;   Surgeon: Gretta Bruckner PARAS, MD;  Location: [facility] INVASIVE CV LAB;  Service: Cardiovascular;  Laterality: Left;  arm fistula   REMOVAL OF A DIALYSIS CATHETER N/A 06/21/2021   Procedure: MINOR REMOVAL OF TUNNELED DIALYSIS CATHETER;  Surgeon: Oris Krystal FALCON, MD;  Location: [facility] ORS;  Service: Vascular;  Laterality: N/A;   REVISON OF ARTERIOVENOUS FISTULA Left 10/05/2020   Procedure: REVISION OF LEFT ARM ARTERIOVENOUS FISTULA;  Surgeon: Harvey Carlin BRAVO, MD;  Location: [facility] OR;  Service: Vascular;  Laterality: Left;   ROBOTIC ASSISTED LAPAROSCOPIC VENTRAL/INCISIONAL HERNIA REPAIR N/A 08/23/2023   Procedure: OPEN PRIMARY REPAIR INCISIONAL HERNIA;  Surgeon: Tanda Locus, MD;  Location: [facility] ORS;  Service: General;  Laterality: N/A;  with Mesh   ROBOTIC ASSISTED TOTAL HYSTERECTOMY WITH BILATERAL SALPINGO OOPHERECTOMY Bilateral 08/23/2023   Procedure: HYSTERECTOMY, TOTAL, ROBOT-ASSISTED, LAPAROSCOPIC, WITH BILATERAL SALPINGO-OOPHORECTOMY, LYSIS OF ADHESIONS;  Surgeon: Viktoria Comer SAUNDERS, MD;  Location: [facility] ORS;  Service: Gynecology;  Laterality: Bilateral;   TUBAL  LIGATION      Current Outpatient Medications  Medication Sig Dispense Refill   amLODipine  (NORVASC ) 10 MG tablet Take 1 tablet (10 mg total) by mouth daily. 30 tablet 1   atorvastatin  (LIPITOR ) 80 MG tablet Take 1 tablet (80 mg total) by mouth daily at 6 PM. 30 tablet 6   cloNIDine  (CATAPRES ) 0.2 MG tablet Take 0.2 mg by mouth 2 (two) times daily.     Continuous Blood Gluc Receiver (DEXCOM G6 RECEIVER) DEVI USE TO CHECK GLUCOSE FOUR TIMES DAILY AS DIRECTED. 1 each 0   cyclobenzaprine  (FLEXERIL ) 10 MG tablet Take 1 tablet (10 mg total) by mouth 2 (two) times daily as needed for muscle spasms. 20 tablet 0   diclofenac  Sodium (VOLTAREN ) 1 % GEL One pump tid prn 100 g 11   FLUoxetine  (PROZAC ) 40 MG capsule Take 40 mg by mouth daily.     furosemide  (LASIX ) 80 MG tablet Take 80 mg by mouth 2 (two) times daily.     gabapentin  (NEURONTIN ) 300 MG capsule TAKE  (1) CAPSULE BY MOUTH DAILY. (Patient taking differently: Take 900 mg by mouth 2 (two) times daily.) 30 capsule 0   hydrALAZINE  (APRESOLINE ) 50 MG tablet Take 50 mg by mouth in the morning and at bedtime.     hydrOXYzine (ATARAX) 10 MG tablet Take 10 mg by mouth 3 (three) times daily as needed for anxiety.     insulin  degludec (TRESIBA  FLEXTOUCH) 100 UNIT/ML FlexTouch Pen Inject 30 Units into the skin at bedtime. 30 mL 3   insulin  lispro (HUMALOG  KWIKPEN) 100 UNIT/ML KwikPen Inject 4-10 Units into the skin 3 (three) times daily. 30 mL 3   lactulose (CHRONULAC) 10 GM/15ML solution Take 10 g by mouth daily.     Lancets (ONETOUCH DELICA PLUS LANCET33G) MISC Apply topically.     lidocaine  (LIDODERM ) 5 % Place 1 patch onto the skin daily as needed. Remove & Discard patch within 12 hours or as directed by MD 15 patch 0   lidocaine -prilocaine  (EMLA ) cream Apply 1 Application topically as needed. One pump tid prn 30 g 11   lubiprostone  (AMITIZA ) 24 MCG capsule Take 1 capsule (24 mcg total) by mouth 2 (two) times daily with a meal. 60 capsule 3   omeprazole  (PRILOSEC  OTC) 20 MG tablet Take 20 mg by mouth daily before breakfast.     ONETOUCH VERIO test strip SMARTSIG:Via Meter (Patient not taking: Reported on 12/13/2023)     oxyCODONE  (ROXICODONE ) 5 MG immediate release tablet Take 1 tablet (5 mg total) by mouth every 6 (six) hours as needed for severe pain (pain score 7-10). 10 tablet 0   polyethylene glycol (MIRALAX ) 17 g packet Take 17 g by mouth daily. (Patient not taking: Reported on 12/13/2023) 14 each 0   Semaglutide , 2 MG/DOSE, (OZEMPIC , 2 MG/DOSE,) 8 MG/3ML SOPN Inject 2 mg into the skin once a week. 9 mL 1   traZODone  (DESYREL ) 100 MG tablet Take 100 mg by mouth at bedtime.     No current facility-administered medications for this visit.    Allergies as of 01/02/2024 - Review Complete 12/15/2023  Allergen Reaction Noted   Contrast media [iodinated contrast media] Anaphylaxis 01/04/2017    Penicillins Rash and Other (See Comments) 04/18/2011    Family History  Problem Relation Age of Onset   Diabetes Mother    Kidney failure Mother    Diabetes Father    Amblyopia Neg Hx    Blindness Neg Hx    Cataracts Neg Hx  Glaucoma Neg Hx    Macular degeneration Neg Hx    Retinal detachment Neg Hx    Strabismus Neg Hx    Retinitis pigmentosa Neg Hx     Social History   Socioeconomic History   Marital status: Single    Spouse name: Not on file   Number of children: Not on file   Years of education: Not on file   Highest education level: Not on file  Occupational History   Not on file  Tobacco Use   Smoking status: Every Day    Current packs/day: 0.00    Average packs/day: (1.2 ttl pk-yrs)    Types: Cigarettes    Start date: 06/05/1981    Last attempt to quit: 06/2022    Years since quitting: 1.5   Smokeless tobacco: Never   Tobacco comments:    Quit in 12/2018 but now smokes off and on per patient 05/04/21.  Vaping Use   Vaping status: Never Used  Substance and Sexual Activity   Alcohol  use: Yes    Comment: occ wine   Drug use: No   Sexual activity: Yes    Birth control/protection: Surgical    Comment: Tubal Ligation  Other Topics Concern   Not on file  Social History Narrative   Not on file   Social Drivers of Health   Financial Resource Strain: Not on file  Food Insecurity: No Food Insecurity (08/24/2023)   Hunger Vital Sign    Worried About Running Out of Food in the Last Year: Never true    Ran Out of Food in the Last Year: Never true  Recent Concern: Food Insecurity - Food Insecurity Present (07/24/2023)   Hunger Vital Sign    Worried About Running Out of Food in the Last Year: Sometimes true    Ran Out of Food in the Last Year: Sometimes true  Transportation Needs: No Transportation Needs (08/24/2023)   PRAPARE - Administrator, Civil Service (Medical): No    Lack of Transportation (Non-Medical): No  Physical Activity: Not on file   Stress: Not on file  Social Connections: Not on file    Review of Systems: Gen: Denies fever, chills, anorexia. Denies fatigue, weakness, weight loss.  CV: Denies chest pain, palpitations, syncope, peripheral edema, and claudication. Resp: Denies dyspnea at rest, cough, wheezing, coughing up blood, and pleurisy. GI: Denies vomiting blood, jaundice, and fecal incontinence.   Denies dysphagia or odynophagia. Derm: Denies rash, itching, dry skin Psych: Denies depression, anxiety, memory loss, confusion. No homicidal or suicidal ideation.  Heme: Denies bruising, bleeding, and enlarged lymph nodes.  Physical Exam: LMP  (LMP Unknown)  General:   Alert and oriented. No distress noted. Pleasant and cooperative.  Head:  Normocephalic and atraumatic. Eyes:  Conjuctiva clear without scleral icterus. Heart:  S1, S2 present without murmurs appreciated. Lungs:  Clear to auscultation bilaterally. No wheezes, rales, or rhonchi. No distress.  Abdomen:  +BS, soft, non-tender and non-distended. No rebound or guarding. No HSM or masses noted. Msk:  Symmetrical without gross deformities. Normal posture. Extremities:  Without edema. Neurologic:  Alert and  oriented x4 Psych:  Normal mood and affect.    Assessment:     Plan:  ***   Josette Centers, PA-C [facility] 01/02/2024

## 2024-01-02 ENCOUNTER — Ambulatory Visit (INDEPENDENT_AMBULATORY_CARE_PROVIDER_SITE_OTHER): Admitting: Gastroenterology

## 2024-01-02 ENCOUNTER — Encounter: Payer: Self-pay | Admitting: Gastroenterology

## 2024-01-02 DIAGNOSIS — K219 Gastro-esophageal reflux disease without esophagitis: Secondary | ICD-10-CM | POA: Diagnosis not present

## 2024-01-02 DIAGNOSIS — K581 Irritable bowel syndrome with constipation: Secondary | ICD-10-CM

## 2024-01-02 MED ORDER — LUBIPROSTONE 24 MCG PO CAPS: 24.0000 ug | ORAL_CAPSULE | Freq: Two times a day (BID) | ORAL | 3 refills | Status: AC

## 2024-01-02 NOTE — Patient Instructions (Signed)
 Stop Linzess .  Start Amitiza  24 mcg twice daily with a meal.  Continue MiraLAX  17g in 8 oz of water  or other noncarbonated beverage of your choice.  You can take this up to twice a day.  Continue Prilosec  daily.  If you are not having any improvement in your constipation in the next couple of weeks, please let me know and I can resubmit for Ibsrela .   We will see you back in 8 weeks or sooner if needed.   Josette Centers, PA-C [facility]

## 2024-01-30 ENCOUNTER — Telehealth: Payer: Self-pay | Admitting: *Deleted

## 2024-01-30 NOTE — Telephone Encounter (Signed)
 Pt called and stated that she has not had a complete bowel movement. She states she has been taking 2 Linzess  145mcg and 2 Amitiza . She is still taking MiraLax  and eating prunes and warm tea, with no results. Pt stated she needs to be seen now or someone needs to get her another GI doctor. I informed the front desk to see if they can find somewhere to squeeze her in.

## 2024-01-31 NOTE — Telephone Encounter (Signed)
 I am sorry she is not having complete bowel movement. I see she is scheduled to see Therisa Stager, NP on 9/2, so I will leave medication changes to [facility]. However, I can give her instructions on a MiraLAX  bowel prep to help get her cleaned out so she can have some relief. Details for prep are below. May be best for her to pick up a printout of these instructions or to also send through mychart.   Also, she should not be taking Linzess  and Amitiza  together. She can only take one or the other.   MiraLAX  bowel prep instructions:  Purchase:  MIRALAX  238 gram bottle, 1 box of DULCOLAX (All over the counter medications)        2 Days prior to procedure:  START CLEAR LIQUID DIET AFTER YOUR LUNCH MEAL--NO SOLID FOODS!        1 Day prior to procedure:  CLEAR LIQUIDS ALL DAY--NO SOLID FOODS!  At 10:00 AM, take 2 DULCOLAX 5mg  tablets  At 12:00 PM, Mix 5 teaspoons of Miralax  in any 4-6 ounces of CLEAR LIQUIDS (Gatorade) every hour for 5 hours until passing clear, watery stools. Be sure to drink 4 ounces of clear liquid 30 minutes after each dose of Miralax .   At 3:00 PM, take 2 Dulcolax 5mg  tablets  If stools are not clear & watery by 6:00 PM, take 5 teaspoons of Miralax  every 30 minutes until stools are clear (no color)  HELPFUL HINTS TO MAKE DRINKING EASIER: -Make sure prep is extremely COLD.  Refrigerate the night before.  You may also put in freezer. -You may try mixing Crystal Light or Country Time Lemonade if you prefer.  MIx in small amounts.  Add more if necessary. -Trying drinking through a straw. -Rinse mouth with water  or mouthwash between glasses to remove aftertaste. -Try sipping on a cold beverage/ice popsicles between glasses of prep. -Place a piece of sugar-free hard candy in mouth between glasses. -If you become nauseated, try consuming smaller amounts or stretch out the time between glasses.  Stop for 30 minutes to an hour & slowly start back drinking.

## 2024-02-05 ENCOUNTER — Ambulatory Visit (INDEPENDENT_AMBULATORY_CARE_PROVIDER_SITE_OTHER): Admitting: Gastroenterology

## 2024-02-05 VITALS — BP 139/81 | HR 88 | Temp 98.6°F | Ht 64.0 in | Wt 215.2 lb

## 2024-02-05 DIAGNOSIS — K219 Gastro-esophageal reflux disease without esophagitis: Secondary | ICD-10-CM | POA: Diagnosis not present

## 2024-02-05 DIAGNOSIS — K5900 Constipation, unspecified: Secondary | ICD-10-CM | POA: Insufficient documentation

## 2024-02-05 DIAGNOSIS — K581 Irritable bowel syndrome with constipation: Secondary | ICD-10-CM | POA: Diagnosis not present

## 2024-02-05 MED ORDER — IBSRELA 50 MG PO TABS: 1.0000 | ORAL_TABLET | Freq: Two times a day (BID) | ORAL | 3 refills | Status: DC

## 2024-02-05 NOTE — Patient Instructions (Signed)
 I have sent Ibsrela  to a specialty pharmacy and provided samples. You will take this twice a day just before breakfast and last meal of the day.  Today, go ahead and do the Miralax  prep. I am hoping this helps reset you.   We will see you in 3 months or sooner if needed! Please call with any concerns.  It was a pleasure to see you today. I want to create trusting relationships with patients and provide genuine, compassionate, and quality care. I truly value your feedback, so please be on the lookout for a survey regarding your visit with me today. I appreciate your time in completing this!         Therisa MICAEL Stager, PhD, ANP-BC [facility]

## 2024-02-05 NOTE — Progress Notes (Signed)
 Gastroenterology Office Note     Primary Care Physician:  Delores Rojelio Caldron, NP  Primary Gastroenterologist:  Dr. Cindie   Chief Complaint   Chief Complaint  Patient presents with   Follow-up    Follow up on constipation, and wants to discuss another constipation med     History of Present Illness   Samantha Pierce is a 53 y.o. female with a history of ESRD on dialysis, COPD, HTN, Stroke, diabetes, depression, bipolar disorder, GERD, chronic constipation presenting today with constipation due to IBS-C.  Last seen by Josette Centers, PA in July 2025. She trialed Ibsrela  in the past and it worked well, however it was denied by insurance. Amitizia 24 mcg BID was prescribed in Aug and she took it for 2 weeks with no improvement. She added Linzess  145 mg along with Amitizia and had diarrhea. Last bowel movement on Saturday morning that was a few hard stools, small amount on Friday also. States that sometimes she will sit on the toilet for 45 minutes and still unable to have a BM. Endorses bloating and lower abdominal pain when unable to have a complete BM.  Denies nausea/vomiting, melena, or hematochezia. Denies opioid use.   States she is using Ozempic  every other week now. However she hasn't noted improvement with decreasing frequency of this.   GERD is well controlled.   Colonoscopy 10/30/2023: - Hemorrhoids found on perianal exam.  - Non- bleeding internal hemorrhoids.  - Redundant colon.  - The examination was otherwise normal. - No specimens collected.. - Recommended repeat colonoscopy in 10 years.   Past Medical History:  Diagnosis Date   Anemia    Anxiety    Asystole (HCC)    a. 05/2015: pt developed bradycardia->asystole during Lexiscan  nuclear stress test, s/p brief code. Cath with only 30% prox LCx. Her asystole during Lexiscan  infusion was felt to represent an excessive pharmacologic response to the agent and likely superimposed vasovagal response.   Bipolar  disorder (HCC)    CHF (congestive heart failure) (HCC)    CKD (chronic kidney disease) stage 4, GFR 15-29 ml/min (HCC)    M_W_F dialysis   COPD (chronic obstructive pulmonary disease) (HCC)    Depression    Essential hypertension    GERD (gastroesophageal reflux disease)    History of blood transfusion 2011   History of seizure 2017   only one seizure - none since   History of stroke 09/2014   Acute lacunar infarct of the left thalamus   Leg cramps    Mild CAD    a. LHC 05/2015: 30% prox Cx, otherwise widely patent.   Morbid obesity (HCC)    Neuromuscular disorder (HCC)    neuropathy in feet   Neuropathy    feet   PTSD (post-traumatic stress disorder)    Retinal detachment    legally blind in right eye   Stroke (HCC)    x 2, right side weakness   Type 2 diabetes mellitus (HCC)     Past Surgical History:  Procedure Laterality Date   AV FISTULA PLACEMENT Right 01/08/2017   Procedure: RIGHT ARM BRACHIOCEPHALIC ARTERIOVENOUS (AV) FISTULA CREATION;  Surgeon: Sheree Penne Bruckner, MD;  Location: [facility] OR;  Service: Vascular;  Laterality: Right;   AV FISTULA PLACEMENT Left 11/20/2018   Procedure: Creation of LEFT ARM ARTERIOVENOUS (AV) FISTULA;  Surgeon: Sheree Penne Bruckner, MD;  Location: [facility] OR;  Service: Vascular;  Laterality: Left;   AV FISTULA PLACEMENT Right 02/24/2021   Procedure:  RIGHT UPPER ARM BASILIC VEIN ARTERIOVENOUS (AV) FISTULA CREATION;  Surgeon: Magda Debby SAILOR, MD;  Location: [facility] OR;  Service: Vascular;  Laterality: Right;   AV FISTULA PLACEMENT Right 05/05/2021   Procedure: INSERTION OF ARTERIOVENOUS (AV) GORE-TEX GRAFT ARM;  Surgeon: Magda Debby SAILOR, MD;  Location: [facility] OR;  Service: Vascular;  Laterality: Right;   BASCILIC VEIN TRANSPOSITION Left 02/11/2019   Procedure: BASILIC VEIN TRANSPOSITION SECOND STAGE;  Surgeon: Sheree Penne Bruckner, MD;  Location: [facility] OR;  Service: Vascular;  Laterality: Left;   CARDIAC CATHETERIZATION N/A 06/01/2015   Procedure:  Left Heart Cath and Coronary Angiography;  Surgeon: Victory LELON Sharps, MD; CFX 30%, no other dz, EF nl by echo   CESAREAN SECTION     x2   COLONOSCOPY N/A 10/30/2023   Procedure: COLONOSCOPY;  Surgeon: Cindie Carlin POUR, DO;  Location: [facility]ITE;  Service: Endoscopy;  Laterality: N/A;  9:15 am, asa 3  dialysis M,W & F   COLONOSCOPY WITH PROPOFOL  N/A 07/27/2022   Procedure: COLONOSCOPY WITH PROPOFOL ;  Surgeon: Cindie Carlin POUR, DO;  Location: [facility];  Service: Endoscopy;  Laterality: N/A;  930am, asa 3, dialysis pt   DILATION AND CURETTAGE OF UTERUS     EYE SURGERY     Right eye pars plano vitrectomy    HEMATOMA EVACUATION Left 03/03/2019   Procedure: EVACUATION HEMATOMA;  Surgeon: Harvey Carlin BRAVO, MD;  Location: [facility] OR;  Service: Vascular;  Laterality: Left;   INSERTION OF DIALYSIS CATHETER Right 01/04/2021   Procedure: INSERTION OF RIGHT INTERNAL JUGULAR TUNNELED DIALYSIS CATHETER;  Surgeon: Harvey Carlin BRAVO, MD;  Location: [facility] OR;  Service: Vascular;  Laterality: Right;   LASIK     LIGATION OF ARTERIOVENOUS  FISTULA Left 01/04/2021   Procedure: LIGATION OF LEFT ARM ARTERIOVENOUS  FISTULA;  Surgeon: Harvey Carlin BRAVO, MD;  Location: [facility] OR;  Service: Vascular;  Laterality: Left;   PARS PLANA VITRECTOMY  04/20/2011   Procedure: PARS PLANA VITRECTOMY WITH 25 GAUGE;  Surgeon: Norleen JONETTA Ku, MD;  Location: [facility] OR;  Service: Ophthalmology;  Laterality: Left;  membrane peel, gas fluid exchange, endolaser, repair of complex retinal detachment   PATCH ANGIOPLASTY Left 10/05/2020   Procedure: PATCH ANGIOPLASTY USING GEORGE BIOLOGIC PATCH;  Surgeon: Harvey Carlin BRAVO, MD;  Location: [facility] OR;  Service: Vascular;  Laterality: Left;   PERIPHERAL VASCULAR BALLOON ANGIOPLASTY Left 09/02/2020   Procedure: PERIPHERAL VASCULAR BALLOON ANGIOPLASTY;  Surgeon: Gretta Bruckner PARAS, MD;  Location: [facility] INVASIVE CV LAB;  Service: Cardiovascular;  Laterality: Left;  arm fistula   REMOVAL OF A DIALYSIS CATHETER N/A  06/21/2021   Procedure: MINOR REMOVAL OF TUNNELED DIALYSIS CATHETER;  Surgeon: Oris Krystal FALCON, MD;  Location: [facility] ORS;  Service: Vascular;  Laterality: N/A;   REVISON OF ARTERIOVENOUS FISTULA Left 10/05/2020   Procedure: REVISION OF LEFT ARM ARTERIOVENOUS FISTULA;  Surgeon: Harvey Carlin BRAVO, MD;  Location: [facility] OR;  Service: Vascular;  Laterality: Left;   ROBOTIC ASSISTED LAPAROSCOPIC VENTRAL/INCISIONAL HERNIA REPAIR N/A 08/23/2023   Procedure: OPEN PRIMARY REPAIR INCISIONAL HERNIA;  Surgeon: Tanda Locus, MD;  Location: [facility] ORS;  Service: General;  Laterality: N/A;  with Mesh   ROBOTIC ASSISTED TOTAL HYSTERECTOMY WITH BILATERAL SALPINGO OOPHERECTOMY Bilateral 08/23/2023   Procedure: HYSTERECTOMY, TOTAL, ROBOT-ASSISTED, LAPAROSCOPIC, WITH BILATERAL SALPINGO-OOPHORECTOMY, LYSIS OF ADHESIONS;  Surgeon: Viktoria Comer SAUNDERS, MD;  Location: [facility] ORS;  Service: Gynecology;  Laterality: Bilateral;   TUBAL LIGATION      Current Outpatient Medications  Medication Sig Dispense Refill  amLODipine  (NORVASC ) 10 MG tablet Take 1 tablet (10 mg total) by mouth daily. 30 tablet 1   atorvastatin  (LIPITOR ) 80 MG tablet Take 1 tablet (80 mg total) by mouth daily at 6 PM. 30 tablet 6   cloNIDine  (CATAPRES ) 0.2 MG tablet Take 0.2 mg by mouth 2 (two) times daily.     Continuous Blood Gluc Receiver (DEXCOM G6 RECEIVER) DEVI USE TO CHECK GLUCOSE FOUR TIMES DAILY AS DIRECTED. 1 each 0   cyclobenzaprine  (FLEXERIL ) 10 MG tablet Take 1 tablet (10 mg total) by mouth 2 (two) times daily as needed for muscle spasms. 20 tablet 0   FLUoxetine  (PROZAC ) 40 MG capsule Take 40 mg by mouth daily.     furosemide  (LASIX ) 80 MG tablet Take 80 mg by mouth 2 (two) times daily.     gabapentin  (NEURONTIN ) 300 MG capsule TAKE (1) CAPSULE BY MOUTH DAILY. 30 capsule 0   hydrALAZINE  (APRESOLINE ) 50 MG tablet Take 50 mg by mouth in the morning and at bedtime.     hydrOXYzine (ATARAX) 10 MG tablet Take 10 mg by mouth 3 (three) times daily as needed for  anxiety.     insulin  degludec (TRESIBA  FLEXTOUCH) 100 UNIT/ML FlexTouch Pen Inject 30 Units into the skin at bedtime. 30 mL 3   insulin  lispro (HUMALOG  KWIKPEN) 100 UNIT/ML KwikPen Inject 4-10 Units into the skin 3 (three) times daily. 30 mL 3   lidocaine  (LIDODERM ) 5 % Place 1 patch onto the skin daily as needed. Remove & Discard patch within 12 hours or as directed by MD 15 patch 0   lidocaine -prilocaine  (EMLA ) cream Apply 1 Application topically as needed. One pump tid prn 30 g 11   lubiprostone  (AMITIZA ) 24 MCG capsule Take 1 capsule (24 mcg total) by mouth 2 (two) times daily with a meal. 60 capsule 3   omeprazole  (PRILOSEC  OTC) 20 MG tablet Take 20 mg by mouth daily before breakfast.     oxyCODONE  (ROXICODONE ) 5 MG immediate release tablet Take 1 tablet (5 mg total) by mouth every 6 (six) hours as needed for severe pain (pain score 7-10). 10 tablet 0   polyethylene glycol (MIRALAX ) 17 g packet Take 17 g by mouth daily. 14 each 0   Semaglutide , 2 MG/DOSE, (OZEMPIC , 2 MG/DOSE,) 8 MG/3ML SOPN Inject 2 mg into the skin once a week. 9 mL 1   traZODone  (DESYREL ) 100 MG tablet Take 100 mg by mouth at bedtime.     diclofenac  Sodium (VOLTAREN ) 1 % GEL One pump tid prn (Patient not taking: Reported on 02/05/2024) 100 g 11   Lancets (ONETOUCH DELICA PLUS LANCET33G) MISC Apply topically.     ONETOUCH VERIO test strip SMARTSIG:Via Meter (Patient not taking: Reported on 01/02/2024)     No current facility-administered medications for this visit.    Allergies as of 02/05/2024 - Review Complete 02/05/2024  Allergen Reaction Noted   Contrast media [iodinated contrast media] Anaphylaxis 01/04/2017   Penicillins Rash and Other (See Comments) 04/18/2011    Family History  Problem Relation Age of Onset   Diabetes Mother    Kidney failure Mother    Diabetes Father    Amblyopia Neg Hx    Blindness Neg Hx    Cataracts Neg Hx    Glaucoma Neg Hx    Macular degeneration Neg Hx    Retinal detachment Neg Hx     Strabismus Neg Hx    Retinitis pigmentosa Neg Hx     Social History   Socioeconomic History  Marital status: Single    Spouse name: Not on file   Number of children: Not on file   Years of education: Not on file   Highest education level: Not on file  Occupational History   Not on file  Tobacco Use   Smoking status: Every Day    Current packs/day: 0.00    Average packs/day: (1.2 ttl pk-yrs)    Types: Cigarettes    Start date: 06/05/1981    Last attempt to quit: 06/2022    Years since quitting: 1.6   Smokeless tobacco: Never   Tobacco comments:    Quit in 12/2018 but now smokes off and on per patient 05/04/21.  Vaping Use   Vaping status: Never Used  Substance and Sexual Activity   Alcohol  use: Yes    Comment: occ wine   Drug use: No   Sexual activity: Yes    Birth control/protection: Surgical    Comment: Tubal Ligation  Other Topics Concern   Not on file  Social History Narrative   Not on file   Social Drivers of Health   Financial Resource Strain: Not on file  Food Insecurity: No Food Insecurity (08/24/2023)   Hunger Vital Sign    Worried About Running Out of Food in the Last Year: Never true    Ran Out of Food in the Last Year: Never true  Recent Concern: Food Insecurity - Food Insecurity Present (07/24/2023)   Hunger Vital Sign    Worried About Running Out of Food in the Last Year: Sometimes true    Ran Out of Food in the Last Year: Sometimes true  Transportation Needs: No Transportation Needs (08/24/2023)   PRAPARE - Administrator, Civil Service (Medical): No    Lack of Transportation (Non-Medical): No  Physical Activity: Not on file  Stress: Not on file  Social Connections: Not on file  Intimate Partner Violence: Not At Risk (08/24/2023)   Humiliation, Afraid, Rape, and Kick questionnaire    Fear of Current or Ex-Partner: No    Emotionally Abused: No    Physically Abused: No    Sexually Abused: No     Review of Systems   Gen: Denies  any fever, chills, fatigue, weight loss, lack of appetite.  CV: Denies chest pain, heart palpitations, peripheral edema, syncope.  Resp: Denies shortness of breath at rest or with exertion. Denies wheezing or cough.  GI: Denies dysphagia or odynophagia. Denies jaundice, hematemesis, fecal incontinence. GU : Denies urinary burning, urinary frequency, urinary hesitancy MS: Denies joint pain, muscle weakness, cramps, or limitation of movement.  Derm: Denies rash, itching, dry skin Psych: Denies depression, anxiety, memory loss, and confusion Heme: Denies bruising, bleeding, and enlarged lymph nodes.   Physical Exam   BP 139/81   Pulse 88   Temp 98.6 F (37 C)   Ht 5' 4 (1.626 m)   Wt 215 lb 3.2 oz (97.6 kg)   LMP  (LMP Unknown)   BMI 36.94 kg/m  General:   Alert and oriented. Pleasant and cooperative. Well-nourished and well-developed.  Head:  Normocephalic and atraumatic. Eyes:  Without icterus Abdomen:  +BS, soft, non-tender and non-distended. No HSM noted. No guarding or rebound. No masses appreciated.  Rectal:  Deferred  Msk:  Symmetrical without gross deformities. Normal posture. Extremities:  Without edema. Neurologic:  Alert and  oriented x4;  grossly normal neurologically. Skin:  Intact without significant lesions or rashes. Psych:  Alert and cooperative. Normal mood and affect.   Assessment  53 y.o. female presenting today with a history of ESRD on dialysis, COPD, HTN, stroke, diabetes, depression, bipolar disorder, GERD, chronic constipation, last seen in July 2025, presenting today with continued constipation (IBS-C).   IBS-C: She continues to have infrequent stools that are hard. She has been taking Amitizia 24 mcg BID and Linzess , which lead to diarrhea. Will have her to stop Amitzia and Linzess . Chrys worked for her in the past, will send in new prescription. Advised to do bowel purge prior to initiating Ibsrela . Limit toilet time to 2-3 minutes.   GERD:  continues omeprazole  20 mg daily.    Colonoscopy on file May 2025 and will be due again in 10 years. May have element of pelvic floor dysfunction; if Ibsrela  without improvement, could consider further evaluation with anorectal manometry.    PLAN    Stop Linzess  and Amitizia.  Do bowel purge with miralax  and dulcolax.  Restart Ibsrela  50 mg twice daily just prior to a meal. Samples given and sent to specialty pharmacy Continue omeprazole  20 mg daily. Follow-up in 3 months or sooner if needed.    Therisa MICAEL Stager, PhD, ANP-BC [facility] Gastroenterology

## 2024-02-07 ENCOUNTER — Telehealth: Payer: Self-pay

## 2024-02-07 NOTE — Telephone Encounter (Signed)
 Documentation from [employer]  that they have received her Rx and it's in the yellow folder on your desk

## 2024-02-08 ENCOUNTER — Telehealth: Payer: Self-pay

## 2024-02-08 NOTE — Telephone Encounter (Signed)
 PA done on [employer] for Ibsrela  50mg  . Dx used: K58.1 (IBS_C). Pt has tried/failed : Amitiza  and Linzess  145 mcg. Waiting on a response from [employer]

## 2024-02-11 NOTE — Telephone Encounter (Signed)
 PA denied for Ibsrela  tab. Insurance requires another alternative for the pt which is on her formulary.

## 2024-02-12 NOTE — Telephone Encounter (Signed)
 What alternative are they requesting? We can have the company assist with covering this. Please see about getting assistance with Ibsrela . Thanks!

## 2024-02-13 NOTE — Telephone Encounter (Signed)
 I looked up the pt's formulary she can have brand name Motegrity, Movantik and Trulance.

## 2024-02-14 MED ORDER — IBSRELA 50 MG PO TABS: 1.0000 | ORAL_TABLET | Freq: Two times a day (BID) | ORAL | 3 refills | Status: AC

## 2024-02-14 NOTE — Addendum Note (Signed)
 Addended by: SHIRLEAN THERISA ORN on: 02/14/2024 04:13 PM   Modules accepted: Orders

## 2024-02-14 NOTE — Telephone Encounter (Signed)
 I have sent again to [facility].  It may need a PA, which we can do. If PA denied, the company should kick in with patient assistance.

## 2024-02-15 NOTE — Telephone Encounter (Signed)
 FYI:  Documentation for Ibsrela  has been received from [facility] ([facility] [employer]) that they have received documentation from us .

## 2024-02-21 NOTE — Telephone Encounter (Signed)
 Spoke with Alan @ [facility] the PA done was denied by insurance (scanned to media). She states that in order to get this for the pt we must appeal with a letter of necessity and they want ov notes as well. Once this is done we will fax to [phone] ([email or web address] Alan). The letter of medical necessity will be forwarded to the pt's insurance as well. This can wait until you return.

## 2024-03-14 ENCOUNTER — Telehealth: Payer: Self-pay

## 2024-03-14 NOTE — Telephone Encounter (Signed)
 error

## 2024-03-14 NOTE — Telephone Encounter (Signed)
 Documentation from [facility] scanned to the pt's chart that needs your attention. They are needing some answers to particular questions. Scanned in media

## 2024-03-26 ENCOUNTER — Telehealth: Payer: Self-pay

## 2024-03-26 NOTE — Telephone Encounter (Signed)
 eror

## 2024-03-27 ENCOUNTER — Other Ambulatory Visit (HOSPITAL_COMMUNITY): Payer: Self-pay | Admitting: Nurse Practitioner

## 2024-03-27 DIAGNOSIS — R0789 Other chest pain: Secondary | ICD-10-CM

## 2024-03-28 NOTE — Telephone Encounter (Signed)
 Denial from [facility] denying the pt's Samantha Pierce  scanned to the pt's chart under media

## 2024-04-01 NOTE — Telephone Encounter (Signed)
 Her insurance has denied Ibsrela . [facility] will help get coverage for her.   I have written an appeal letter and printed. Please send to The [employer] [employer] at [phone]. They will also need the denial letter from her insurance (I'm not sure where this is).   Once the assistance program submits again to her insurance and is denied, they can kick in to help with approval.  Please provide samples if patient needs this in the interim.    Thank you!

## 2024-04-02 NOTE — Telephone Encounter (Signed)
 Faxed the letter / denial paperwork to [employer] @ [phone]. Waiting on confirmation that it went through and then will scan to the pt's chart. Will also provide samples for the pt and advise her of this tomorrow.

## 2024-04-03 NOTE — Telephone Encounter (Signed)
 Confirmation returned and scanned to the pt's chart. Will call pt to pick up samples

## 2024-04-05 ENCOUNTER — Other Ambulatory Visit: Payer: Self-pay | Admitting: Gastroenterology

## 2024-04-05 DIAGNOSIS — K581 Irritable bowel syndrome with constipation: Secondary | ICD-10-CM

## 2024-04-07 NOTE — Telephone Encounter (Signed)
 Pt picked up samples Friday, Oct.31st

## 2024-04-09 NOTE — Telephone Encounter (Signed)
 Documentation from [facility] scanned to the pt's chart regarding a questions needing to be answered and sent back today. They also called Charmaine Patch and spoke with her (but I took to finish). Please advise

## 2024-04-09 NOTE — Telephone Encounter (Signed)
 Reviewed documentation.   #1: Does the patient have a hx of failure or intolerance to formulary alternative? She has tried Amitiza  and Linzess .   #2. Specific medical reason why not able to take formulary alternative Trulance:  Patient has failed TWO other drugs in the class of secretagogues (Amitiza  and Linzess ). Trulance is in this same class, and it is highly unlikely she will respond with better efficacy as already failed 2 out of the 3 in the class. She has  noted marked improvement in Ibsrela  with improved quality of life.    If this is denied, then [facility] can help provide her drug coverage. I believe they just need to ensure a PA has been done.

## 2024-04-09 NOTE — Telephone Encounter (Signed)
 Documentation filled out and faxed to [facility] for the expedited appeal and confirmation returned

## 2024-04-10 ENCOUNTER — Ambulatory Visit (HOSPITAL_COMMUNITY)
Admission: RE | Admit: 2024-04-10 | Discharge: 2024-04-10 | Disposition: A | Discharge status: Home / Self Care | Source: Ambulatory Visit | Attending: Nurse Practitioner | Admitting: Nurse Practitioner

## 2024-04-10 ENCOUNTER — Encounter: Payer: Self-pay | Admitting: Gastroenterology

## 2024-04-10 DIAGNOSIS — R0789 Other chest pain: Secondary | ICD-10-CM | POA: Insufficient documentation

## 2024-04-14 NOTE — Telephone Encounter (Signed)
 Phoned and spoke with the pt and was advised she has not heard back regarding her IBSrela . Will try and make contact today

## 2024-04-17 ENCOUNTER — Ambulatory Visit: Admitting: Nurse Practitioner

## 2024-04-21 NOTE — Telephone Encounter (Signed)
Phoned and LMOVM for the pt to return call 

## 2024-04-24 NOTE — Telephone Encounter (Signed)
 FYI:  Pt is in contact with [employer]  and they are mailing her medication

## 2024-05-03 ENCOUNTER — Other Ambulatory Visit: Payer: Self-pay | Admitting: Nurse Practitioner

## 2024-06-27 ENCOUNTER — Ambulatory Visit (INDEPENDENT_AMBULATORY_CARE_PROVIDER_SITE_OTHER): Admitting: Nurse Practitioner

## 2024-06-27 ENCOUNTER — Encounter: Payer: Self-pay | Admitting: Nurse Practitioner

## 2024-06-27 VITALS — BP 132/74 | HR 85 | Ht 64.0 in | Wt 214.2 lb

## 2024-06-27 DIAGNOSIS — E1165 Type 2 diabetes mellitus with hyperglycemia: Secondary | ICD-10-CM | POA: Diagnosis not present

## 2024-06-27 DIAGNOSIS — I10 Essential (primary) hypertension: Secondary | ICD-10-CM | POA: Diagnosis not present

## 2024-06-27 DIAGNOSIS — E782 Mixed hyperlipidemia: Secondary | ICD-10-CM | POA: Diagnosis not present

## 2024-06-27 DIAGNOSIS — Z794 Long term (current) use of insulin: Secondary | ICD-10-CM

## 2024-06-27 DIAGNOSIS — Z7985 Long-term (current) use of injectable non-insulin antidiabetic drugs: Secondary | ICD-10-CM | POA: Diagnosis not present

## 2024-06-27 MED ORDER — TRESIBA FLEXTOUCH 100 UNIT/ML ~~LOC~~ SOPN: 30.0000 [IU] | PEN_INJECTOR | Freq: Every day | SUBCUTANEOUS | 3 refills | Status: AC

## 2024-06-27 MED ORDER — OZEMPIC (2 MG/DOSE) 8 MG/3ML ~~LOC~~ SOPN: 2.0000 mg | PEN_INJECTOR | SUBCUTANEOUS | 3 refills | Status: AC

## 2024-06-27 MED ORDER — PEN NEEDLES 31G X 5 MM MISC: 3 refills | Status: AC

## 2024-06-27 MED ORDER — INSULIN LISPRO (1 UNIT DIAL) 100 UNIT/ML (KWIKPEN): 4.0000 [IU] | PEN_INJECTOR | Freq: Three times a day (TID) | SUBCUTANEOUS | 3 refills | Status: AC

## 2024-06-27 NOTE — Progress Notes (Signed)
 "                                                                        Endocrinology Follow Up Visit      06/27/2024, 11:34 AM   Subjective:    Patient ID: Samantha Pierce, female    DOB: 06/29/1960.  Samantha Pierce is being seen in follow up after being seen in consultation for management of currently uncontrolled symptomatic diabetes requested by  Delores Rojelio Caldron, NP.   Past Medical History:  Diagnosis Date   Anemia    Anxiety    Asystole (HCC)    a. 05/2015: pt developed bradycardia->asystole during Lexiscan  nuclear stress test, s/p brief code. Cath with only 30% prox LCx. Her asystole during Lexiscan  infusion was felt to represent an excessive pharmacologic response to the agent and likely superimposed vasovagal response.   Bipolar disorder (HCC)    CHF (congestive heart failure) (HCC)    CKD (chronic kidney disease) stage 4, GFR 15-29 ml/min (HCC)    M_W_F dialysis   COPD (chronic obstructive pulmonary disease) (HCC)    Depression    Essential hypertension    GERD (gastroesophageal reflux disease)    History of blood transfusion 2011   History of seizure 2017   only one seizure - none since   History of stroke 09/2014   Acute lacunar infarct of the left thalamus   Leg cramps    Mild CAD    a. LHC 05/2015: 30% prox Cx, otherwise widely patent.   Morbid obesity (HCC)    Neuromuscular disorder (HCC)    neuropathy in feet   Neuropathy    feet   PTSD (post-traumatic stress disorder)    Retinal detachment    legally blind in right eye   Stroke (HCC)    x 2, right side weakness   Type 2 diabetes mellitus (HCC)     Past Surgical History:  Procedure Laterality Date   AV FISTULA PLACEMENT Right 01/08/2017   Procedure: RIGHT ARM BRACHIOCEPHALIC ARTERIOVENOUS (AV) FISTULA CREATION;  Surgeon: Sheree Penne Bruckner, MD;  Location: [facility] OR;  Service: Vascular;  Laterality: Right;   AV FISTULA PLACEMENT Left 11/20/2018   Procedure: Creation of LEFT ARM ARTERIOVENOUS (AV)  FISTULA;  Surgeon: Sheree Penne Bruckner, MD;  Location: [facility] OR;  Service: Vascular;  Laterality: Left;   AV FISTULA PLACEMENT Right 02/24/2021   Procedure: RIGHT UPPER ARM BASILIC VEIN ARTERIOVENOUS (AV) FISTULA CREATION;  Surgeon: Magda Debby SAILOR, MD;  Location: [facility] OR;  Service: Vascular;  Laterality: Right;   AV FISTULA PLACEMENT Right 05/05/2021   Procedure: INSERTION OF ARTERIOVENOUS (AV) GORE-TEX GRAFT ARM;  Surgeon: Magda Debby SAILOR, MD;  Location: [facility] OR;  Service: Vascular;  Laterality: Right;   BASCILIC VEIN TRANSPOSITION Left 02/11/2019   Procedure: BASILIC VEIN TRANSPOSITION SECOND STAGE;  Surgeon: Sheree Penne Bruckner, MD;  Location: [facility] OR;  Service: Vascular;  Laterality: Left;   CARDIAC CATHETERIZATION N/A 06/01/2015   Procedure: Left Heart Cath and Coronary Angiography;  Surgeon: Victory LELON Sharps, MD; CFX 30%, no other dz, EF nl by echo   CESAREAN SECTION     x2   COLONOSCOPY N/A 10/30/2023   Procedure: COLONOSCOPY;  Surgeon: Cindie Carlin POUR, DO;  Location: [facility];  Service: Endoscopy;  Laterality: N/A;  9:15 am, asa 3  dialysis M,W & F   COLONOSCOPY WITH PROPOFOL  N/A 07/27/2022   Procedure: COLONOSCOPY WITH PROPOFOL ;  Surgeon: Cindie Carlin POUR, DO;  Location: [facility];  Service: Endoscopy;  Laterality: N/A;  930am, asa 3, dialysis pt   DILATION AND CURETTAGE OF UTERUS     EYE SURGERY     Right eye pars plano vitrectomy    HEMATOMA EVACUATION Left 03/03/2019   Procedure: EVACUATION HEMATOMA;  Surgeon: Harvey Carlin BRAVO, MD;  Location: [facility] OR;  Service: Vascular;  Laterality: Left;   INSERTION OF DIALYSIS CATHETER Right 01/04/2021   Procedure: INSERTION OF RIGHT INTERNAL JUGULAR TUNNELED DIALYSIS CATHETER;  Surgeon: Harvey Carlin BRAVO, MD;  Location: [facility] OR;  Service: Vascular;  Laterality: Right;   LASIK     LIGATION OF ARTERIOVENOUS  FISTULA Left 01/04/2021   Procedure: LIGATION OF LEFT ARM ARTERIOVENOUS  FISTULA;  Surgeon: Harvey Carlin BRAVO, MD;  Location: [facility] OR;   Service: Vascular;  Laterality: Left;   PARS PLANA VITRECTOMY  04/20/2011   Procedure: PARS PLANA VITRECTOMY WITH 25 GAUGE;  Surgeon: Norleen JONETTA Ku, MD;  Location: [facility] OR;  Service: Ophthalmology;  Laterality: Left;  membrane peel, gas fluid exchange, endolaser, repair of complex retinal detachment   PATCH ANGIOPLASTY Left 10/05/2020   Procedure: PATCH ANGIOPLASTY USING GEORGE BIOLOGIC PATCH;  Surgeon: Harvey Carlin BRAVO, MD;  Location: [facility] OR;  Service: Vascular;  Laterality: Left;   PERIPHERAL VASCULAR BALLOON ANGIOPLASTY Left 09/02/2020   Procedure: PERIPHERAL VASCULAR BALLOON ANGIOPLASTY;  Surgeon: Gretta Lonni PARAS, MD;  Location: [facility] INVASIVE CV LAB;  Service: Cardiovascular;  Laterality: Left;  arm fistula   REMOVAL OF A DIALYSIS CATHETER N/A 06/21/2021   Procedure: MINOR REMOVAL OF TUNNELED DIALYSIS CATHETER;  Surgeon: Oris Krystal FALCON, MD;  Location: [facility] ORS;  Service: Vascular;  Laterality: N/A;   REVISON OF ARTERIOVENOUS FISTULA Left 10/05/2020   Procedure: REVISION OF LEFT ARM ARTERIOVENOUS FISTULA;  Surgeon: Harvey Carlin BRAVO, MD;  Location: [facility] OR;  Service: Vascular;  Laterality: Left;   ROBOTIC ASSISTED LAPAROSCOPIC VENTRAL/INCISIONAL HERNIA REPAIR N/A 08/23/2023   Procedure: OPEN PRIMARY REPAIR INCISIONAL HERNIA;  Surgeon: Tanda Locus, MD;  Location: [facility] ORS;  Service: General;  Laterality: N/A;  with Mesh   ROBOTIC ASSISTED TOTAL HYSTERECTOMY WITH BILATERAL SALPINGO OOPHERECTOMY Bilateral 08/23/2023   Procedure: HYSTERECTOMY, TOTAL, ROBOT-ASSISTED, LAPAROSCOPIC, WITH BILATERAL SALPINGO-OOPHORECTOMY, LYSIS OF ADHESIONS;  Surgeon: Viktoria Comer SAUNDERS, MD;  Location: [facility] ORS;  Service: Gynecology;  Laterality: Bilateral;   TUBAL LIGATION      Social History   Socioeconomic History   Marital status: Single    Spouse name: Not on file   Number of children: Not on file   Years of education: Not on file   Highest education level: Not on file  Occupational History   Not on file  Tobacco  Use   Smoking status: Every Day    Current packs/day: 0.00    Average packs/day: (1.2 ttl pk-yrs)    Types: Cigarettes    Start date: 06/05/1981    Last attempt to quit: 06/2022    Years since quitting: 2.0   Smokeless tobacco: Never   Tobacco comments:    Quit in 12/2018 but now smokes off and on per patient 05/04/21.  Vaping Use   Vaping status: Never Used  Substance and Sexual Activity   Alcohol  use: Yes    Comment: occ wine   Drug use:  No   Sexual activity: Yes    Birth control/protection: Surgical    Comment: Tubal Ligation  Other Topics Concern   Not on file  Social History Narrative   Not on file   Social Drivers of Health   Tobacco Use: High Risk (06/27/2024)   Patient History    Smoking Tobacco Use: Every Day    Smokeless Tobacco Use: Never    Passive Exposure: Not on file  Financial Resource Strain: Not on file  Food Insecurity: No Food Insecurity (08/24/2023)   Hunger Vital Sign    Worried About Running Out of Food in the Last Year: Never true    Ran Out of Food in the Last Year: Never true  Recent Concern: Food Insecurity - Food Insecurity Present (07/24/2023)   Hunger Vital Sign    Worried About Running Out of Food in the Last Year: Sometimes true    Ran Out of Food in the Last Year: Sometimes true  Transportation Needs: No Transportation Needs (08/24/2023)   PRAPARE - Administrator, Civil Service (Medical): No    Lack of Transportation (Non-Medical): No  Physical Activity: Not on file  Stress: Not on file  Social Connections: Not on file  Depression (PHQ2-9): High Risk (07/24/2023)   Depression (PHQ2-9)    PHQ-2 Score: 17  Alcohol  Screen: Not on file  Housing: Unknown (09/25/2023)   Received from [facility]   Epic    Unable to Pay for Housing in the Last Year: Not on file    Number of Times Moved in the Last Year: Not on file    At any time in the past 12 months, were you homeless or living in a shelter (including now)?: No   Utilities: Not At Risk (08/24/2023)   [facility] Utilities    Threatened with loss of utilities: No  Recent Concern: Utilities - At Risk (07/24/2023)   [facility] Utilities    Threatened with loss of utilities: Yes  Health Literacy: Not on file    Family History  Problem Relation Age of Onset   Diabetes Mother    Kidney failure Mother    Diabetes Father    Amblyopia Neg Hx    Blindness Neg Hx    Cataracts Neg Hx    Glaucoma Neg Hx    Macular degeneration Neg Hx    Retinal detachment Neg Hx    Strabismus Neg Hx    Retinitis pigmentosa Neg Hx     Outpatient Encounter Medications as of 06/27/2024  Medication Sig   amLODipine  (NORVASC ) 10 MG tablet Take 1 tablet (10 mg total) by mouth daily.   atorvastatin  (LIPITOR ) 80 MG tablet Take 1 tablet (80 mg total) by mouth daily at 6 PM.   cloNIDine  (CATAPRES ) 0.2 MG tablet Take 0.2 mg by mouth 2 (two) times daily.   cyclobenzaprine  (FLEXERIL ) 10 MG tablet Take 1 tablet (10 mg total) by mouth 2 (two) times daily as needed for muscle spasms.   FLUoxetine  (PROZAC ) 40 MG capsule Take 40 mg by mouth daily.   furosemide  (LASIX ) 80 MG tablet Take 80 mg by mouth 2 (two) times daily.   gabapentin  (NEURONTIN ) 300 MG capsule TAKE (1) CAPSULE BY MOUTH DAILY.   hydrALAZINE  (APRESOLINE ) 50 MG tablet Take 50 mg by mouth in the morning and at bedtime.   hydrOXYzine (ATARAX) 10 MG tablet Take 10 mg by mouth 3 (three) times daily as needed for anxiety.   Insulin  Pen Needle (PEN NEEDLES) 31G X  5 MM MISC Dispense based on patient and insurance preference. Use up to four times daily as directed. (FOR ICD-10 E10.9, E11.9).   Lancets (ONETOUCH DELICA PLUS LANCET33G) MISC Apply topically.   lidocaine  (LIDODERM ) 5 % Place 1 patch onto the skin daily as needed. Remove & Discard patch within 12 hours or as directed by MD   lidocaine -prilocaine  (EMLA ) cream Apply 1 Application topically as needed. One pump tid prn   lubiprostone  (AMITIZA ) 24 MCG capsule Take 1 capsule (24 mcg  total) by mouth 2 (two) times daily with a meal.   omeprazole  (PRILOSEC  OTC) 20 MG tablet Take 20 mg by mouth daily before breakfast.   polyethylene glycol (MIRALAX ) 17 g packet Take 17 g by mouth daily.   Tenapanor  HCl (IBSRELA ) 50 MG TABS Take 1 tablet by mouth 2 (two) times daily before a meal. Failed Amitiza  and Linzess , Miralax , OTC stool softeners   traZODone  (DESYREL ) 100 MG tablet Take 100 mg by mouth at bedtime.   [DISCONTINUED] Continuous Blood Gluc Receiver (DEXCOM G6 RECEIVER) DEVI USE TO CHECK GLUCOSE FOUR TIMES DAILY AS DIRECTED.   [DISCONTINUED] insulin  degludec (TRESIBA  FLEXTOUCH) 100 UNIT/ML FlexTouch Pen Inject 30 Units into the skin at bedtime.   [DISCONTINUED] insulin  lispro (HUMALOG  KWIKPEN) 100 UNIT/ML KwikPen Inject 4-10 Units into the skin 3 (three) times daily.   [DISCONTINUED] Semaglutide , 2 MG/DOSE, (OZEMPIC , 2 MG/DOSE,) 8 MG/3ML SOPN Inject 2 mg into the skin once a week.   insulin  degludec (TRESIBA  FLEXTOUCH) 100 UNIT/ML FlexTouch Pen Inject 30 Units into the skin at bedtime.   insulin  lispro (HUMALOG  KWIKPEN) 100 UNIT/ML KwikPen Inject 4-10 Units into the skin 3 (three) times daily.   ONETOUCH VERIO test strip SMARTSIG:Via Meter (Patient not taking: Reported on 01/02/2024)   Semaglutide , 2 MG/DOSE, (OZEMPIC , 2 MG/DOSE,) 8 MG/3ML SOPN Inject 2 mg into the skin once a week.   No facility-administered encounter medications on file as of 06/27/2024.    ALLERGIES: Allergies  Allergen Reactions   Contrast Media [Iodinated Contrast Media] Anaphylaxis    code blue--I died   Penicillins Rash and Other (See Comments)    Childhood     VACCINATION STATUS: Immunization History  Administered Date(s) Administered   Influenza,trivalent, recombinat, inj, PF 07/05/2016   Influenza-Unspecified 08/26/2020   Moderna Sars-Covid-2 Vaccination 10/27/2019, 11/24/2019   Pneumococcal Polysaccharide-23 09/05/2014, 08/26/2020    Diabetes She presents for her follow-up diabetic  visit. She has type 2 diabetes mellitus. Onset time: She was diagnosed at approximate age of 74. Her disease course has been stable. There are no hypoglycemic associated symptoms. Associated symptoms include fatigue and foot paresthesias. Pertinent negatives for diabetes include no polydipsia, no polyphagia, no polyuria and no weight loss. There are no hypoglycemic complications. Symptoms are stable. Diabetic complications include heart disease, nephropathy and peripheral neuropathy. (Legally blind in right eye, says she was hospitalized for DKA once; now on HD) Risk factors for coronary artery disease include diabetes mellitus, dyslipidemia, hypertension, obesity, sedentary lifestyle and tobacco exposure. Current diabetic treatment includes intensive insulin  program (and Ozempic ). She is compliant with treatment most of the time. Her weight is fluctuating minimally. She is following a generally healthy diet. When asked about meal planning, she reported none. She has not had a previous visit with a dietitian. She rarely participates in exercise. Her home blood glucose trend is decreasing steadily. Her overall blood glucose range is >200 mg/dl. (She presents today with her CGM showing fluctuating glycemic profile and tight fasting readings.  Her POCT A1c today  is 7.3%, essentially unchanged from last visit of 7.2%.  She has been waking up to low glucose.  I did help adjust date and time in her CGM, thus the report printed off today is inaccurate.) An ACE inhibitor/angiotensin II receptor blocker is not being taken. She does not see a podiatrist.Eye exam is current.    Review of systems  Constitutional: + stable body weight,  current Body mass index is 36.77 kg/m. , + fatigue, no subjective hyperthermia, no subjective hypothermia Eyes: + blurry vision (diabetic retinopathy and legally blind in Right eye), no xerophthalmia ENT: no sore throat, no nodules palpated in throat, no dysphagia/odynophagia, no  hoarseness Cardiovascular: no chest pain, no shortness of breath, no palpitations, no leg swelling Respiratory: no cough, no shortness of breath Gastrointestinal: no nausea/vomiting/diarrhea, + chronic constipation r/t gastroparesis-improved somewhat Musculoskeletal: no muscle/joint aches Skin: no rashes, no hyperemia Neurological: no tremors, no dizziness, + numbness/tingling to BLE R>L Psychiatric: no depression, no anxiety   Objective:     BP 132/74 (BP Location: Left Arm, Patient Position: Sitting, Cuff Size: Large)   Pulse 85   Ht 5' 4 (1.626 m)   Wt 214 lb 3.2 oz (97.2 kg)   LMP  (LMP Unknown)   BMI 36.77 kg/m   Wt Readings from Last 3 Encounters:  06/27/24 214 lb 3.2 oz (97.2 kg)  02/05/24 215 lb 3.2 oz (97.6 kg)  01/02/24 215 lb 3.2 oz (97.6 kg)    BP Readings from Last 3 Encounters:  06/27/24 132/74  02/05/24 139/81  01/02/24 126/78    Physical Exam- Limited  Constitutional:  Body mass index is 36.77 kg/m. , not in acute distress, normal state of mind Eyes:  EOMI, no exophthalmos Musculoskeletal: no gross deformities, strength intact in all four extremities, no gross restriction of joint movements Skin:  no rashes, no hyperemia Neurological: no tremor with outstretched hands   Diabetic Foot Exam - Simple   No data filed     CMP ( most recent) CMP     Component Value Date/Time   NA 133 (L) 10/30/2023 0900   K 5.4 (H) 10/30/2023 0900   CL 100 10/30/2023 0900   CO2 23 08/24/2023 0438   GLUCOSE 187 (H) 10/30/2023 0900   BUN 45 (H) 10/30/2023 0900   BUN 31 (A) 07/22/2021 0000   CREATININE 7.00 (H) 10/30/2023 0900   CALCIUM  8.7 (L) 08/24/2023 0438   PROT 7.6 06/25/2023 1354   ALBUMIN  3.8 06/25/2023 1354   AST 20 06/25/2023 1354   ALT 19 06/25/2023 1354   ALKPHOS 48 06/25/2023 1354   BILITOT 0.6 06/25/2023 1354   GFRNONAA 7 (L) 08/24/2023 0438   GFRAA 12 (L) 03/04/2019 0616     Diabetic Labs (most recent): Lab Results  Component Value Date    HGBA1C 7.2 (A) 12/13/2023   HGBA1C 6.8 (H) 08/14/2023   HGBA1C 6.8 (A) 08/13/2023     Lipid Panel ( most recent) Lipid Panel     Component Value Date/Time   CHOL 153 09/04/2022 1223   TRIG 122 09/04/2022 1223   HDL 55 09/04/2022 1223   CHOLHDL 2.8 09/04/2022 1223   CHOLHDL 5.4 05/23/2015 0545   VLDL 56 (H) 05/23/2015 0545   LDLCALC 76 09/04/2022 1223   LABVLDL 22 09/04/2022 1223      Lab Results  Component Value Date   TSH 0.524 09/04/2022   TSH 1.524 09/04/2014   FREET4 1.31 09/04/2022           Assessment &  Plan:   1) Controlled diabetes mellitus with stage 4 chronic kidney disease (HCC)  She presents today with her CGM showing fluctuating glycemic profile and tight fasting readings.  Her POCT A1c today is 7.3%, essentially unchanged from last visit of 7.2%.  She has been waking up to low glucose.  I did help adjust date and time in her CGM, thus the report printed off today is inaccurate.  - Samantha Pierce has currently uncontrolled symptomatic type 2 DM since 54 years of age.  -Recent labs reviewed.  - I had a long discussion with her about the progressive nature of diabetes and the pathology behind its complications. -her diabetes is complicated by CVA, retinopathy, neuropathy, CKD, and DKA x 1 episode and she remains at a high risk for more acute and chronic complications which include worsening CAD, CVA, CKD, retinopathy, and neuropathy. These are all discussed in detail with her.  - Nutritional counseling repeated/built upon at each appointment.  - The patient admits there is a room for improvement in their diet and drink choices. -  Suggestion is made for the patient to avoid simple carbohydrates from their diet including Cakes, Sweet Desserts / Pastries, Ice Cream, Soda (diet and regular), Sweet Tea, Candies, Chips, Cookies, Sweet Pastries, Store Bought Juices, Alcohol  in Excess of 1-2 drinks a day, Artificial Sweeteners, Coffee Creamer, and Sugar-free  Products. This will help patient to have stable blood glucose profile and potentially avoid unintended weight gain.   - I encouraged the patient to switch to unprocessed or minimally processed complex starch and increased protein intake (animal or plant source), fruits, and vegetables.   - Patient is advised to stick to a routine mealtimes to eat 3 meals a day and avoid unnecessary snacks (to snack only to correct hypoglycemia).  - she will be scheduled with Santana Duke, RDN, CDE for diabetes education.  - I have approached her with the following individualized plan to manage  her diabetes and patient agrees:   -She is advised to lower Tresiba  to 20 units SQ nightly-at supper, and continue Novolog  4-10 units TID with meals if glucose is above 90 and she is eating (Specific instructions on how to titrate insulin  dosage based on glucose readings given to patient in writing), and Ozempic  2 mg SQ weekly.   -she is encouraged to continue monitoring blood glucose 4 times daily (using her CGM), before meals and before bed, and to call the clinic if she has readings less than 70 or greater than 300 for 3 tests in a row.   - she is warned not to take insulin  without proper monitoring per orders. - Adjustment parameters are given to her for hypo and hyperglycemia in writing.  - she is not a candidate for Metformin , SGLT2 due to concurrent renal insufficiency.  - Specific targets for  A1c;  LDL, HDL,  and Triglycerides were discussed with the patient.  2) Blood Pressure /Hypertension:  her blood pressure is controlled to target.   she is advised to continue her current medications as prescribed by PCP/nephrology.  3) Lipids/Hyperlipidemia:    Her recent lipid panel from 09/04/22 shows controlled LDL of 76.  She is advised to continue Lipitor  80 mg po daily before bed.  Side effects and precautions discussed with her.    4)  Weight/Diet:  her Body mass index is 36.77 kg/m.  -  clearly complicating  her diabetes care.   she is a candidate for weight loss. I discussed with her the  fact that loss of 5 - 10% of her  current body weight will have the most impact on her diabetes management.  Exercise, and detailed carbohydrates information provided  -  detailed on discharge instructions.  5) Chronic Care/Health Maintenance: -she is not on ACEI/ARB and is on Statin medications and is encouraged to initiate and continue to follow up with Ophthalmology, Dentist, Podiatrist at least yearly or according to recommendations, and advised to stay away from smoking. I have recommended yearly flu vaccine and pneumonia vaccine at least every 5 years; moderate intensity exercise for up to 150 minutes weekly; and sleep for at least 7 hours a day.  - she is advised to maintain close follow up with Delores Rojelio Caldron, NP for primary care needs, as well as her other providers for optimal and coordinated care.     I spent  28  minutes in the care of the patient today including review of labs from CMP, Lipids, Thyroid  Function, Hematology (current and previous including abstractions from other facilities); face-to-face time discussing  her blood glucose readings/logs, discussing hypoglycemia and hyperglycemia episodes and symptoms, medications doses, her options of short and long term treatment based on the latest standards of care / guidelines;  discussion about incorporating lifestyle medicine;  and documenting the encounter. Risk reduction counseling performed per USPSTF guidelines to reduce obesity and cardiovascular risk factors.     Please refer to Patient Instructions for Blood Glucose Monitoring and Insulin /Medications Dosing Guide  in media tab for additional information. Please  also refer to  Patient Self Inventory in the Media  tab for reviewed elements of pertinent patient history.  Samantha Pierce participated in the discussions, expressed understanding, and voiced agreement with the above plans.  All  questions were answered to her satisfaction. she is encouraged to contact clinic should she have any questions or concerns prior to her return visit.   Follow up plan: - Return in about 4 months (around 10/25/2024) for Diabetes F/U with A1c in office, No previsit labs, Bring meter and logs.  Benton Rio, [facility] [address] [place], [place] [address] Phone: [phone] Fax: [phone]  06/27/2024, 11:34 AM    "

## 2024-09-26 ENCOUNTER — Ambulatory Visit: Admitting: Nurse Practitioner
